# Patient Record
Sex: Male | Born: 1960 | Race: Black or African American | Hispanic: No | Marital: Single | Smoking: Never smoker
Health system: Southern US, Community
[De-identification: ages and names within clinical notes are randomized; demographics above are authoritative.]

## PROBLEM LIST (undated history)

## (undated) DIAGNOSIS — Z992 Dependence on renal dialysis: Secondary | ICD-10-CM

## (undated) DIAGNOSIS — I509 Heart failure, unspecified: Secondary | ICD-10-CM

## (undated) DIAGNOSIS — D649 Anemia, unspecified: Secondary | ICD-10-CM

## (undated) DIAGNOSIS — F411 Generalized anxiety disorder: Secondary | ICD-10-CM

## (undated) DIAGNOSIS — N186 End stage renal disease: Secondary | ICD-10-CM

## (undated) DIAGNOSIS — I10 Essential (primary) hypertension: Secondary | ICD-10-CM

## (undated) HISTORY — PX: CENTRAL VENOUS CATHETER INSERTION: SHX401

## (undated) HISTORY — DX: Generalized anxiety disorder: F41.1

## (undated) HISTORY — PX: APPENDECTOMY: SHX54

## (undated) HISTORY — PX: PERITONEAL CATHETER INSERTION: SHX2223

---

## 1998-03-13 ENCOUNTER — Emergency Department (HOSPITAL_COMMUNITY): Admission: EM | Admit: 1998-03-13 | Discharge: 1998-03-13 | Payer: Self-pay

## 2001-02-20 ENCOUNTER — Emergency Department (HOSPITAL_COMMUNITY): Admission: EM | Admit: 2001-02-20 | Discharge: 2001-02-20 | Payer: Self-pay | Admitting: Emergency Medicine

## 2004-03-08 ENCOUNTER — Emergency Department (HOSPITAL_COMMUNITY): Admission: EM | Admit: 2004-03-08 | Discharge: 2004-03-08 | Payer: Self-pay | Admitting: Emergency Medicine

## 2006-05-20 ENCOUNTER — Emergency Department (HOSPITAL_COMMUNITY): Admission: EM | Admit: 2006-05-20 | Discharge: 2006-05-20 | Payer: Self-pay | Admitting: Family Medicine

## 2006-06-29 ENCOUNTER — Emergency Department (HOSPITAL_COMMUNITY): Admission: EM | Admit: 2006-06-29 | Discharge: 2006-06-29 | Payer: Self-pay | Admitting: Emergency Medicine

## 2006-09-30 ENCOUNTER — Emergency Department (HOSPITAL_COMMUNITY): Admission: EM | Admit: 2006-09-30 | Discharge: 2006-09-30 | Payer: Self-pay | Admitting: Family Medicine

## 2007-02-19 ENCOUNTER — Emergency Department (HOSPITAL_COMMUNITY): Admission: EM | Admit: 2007-02-19 | Discharge: 2007-02-19 | Payer: Self-pay | Admitting: Family Medicine

## 2007-04-30 ENCOUNTER — Emergency Department (HOSPITAL_COMMUNITY): Admission: EM | Admit: 2007-04-30 | Discharge: 2007-04-30 | Payer: Self-pay | Admitting: Emergency Medicine

## 2007-11-05 ENCOUNTER — Emergency Department (HOSPITAL_COMMUNITY): Admission: EM | Admit: 2007-11-05 | Discharge: 2007-11-05 | Payer: Self-pay | Admitting: Emergency Medicine

## 2007-11-15 ENCOUNTER — Emergency Department (HOSPITAL_COMMUNITY): Admission: EM | Admit: 2007-11-15 | Discharge: 2007-11-15 | Payer: Self-pay | Admitting: Emergency Medicine

## 2009-12-03 ENCOUNTER — Inpatient Hospital Stay (HOSPITAL_COMMUNITY)
Admission: EM | Admit: 2009-12-03 | Discharge: 2009-12-11 | Payer: Self-pay | Source: Home / Self Care | Admitting: Emergency Medicine

## 2009-12-03 ENCOUNTER — Ambulatory Visit: Payer: Self-pay | Admitting: Internal Medicine

## 2009-12-04 ENCOUNTER — Encounter (INDEPENDENT_AMBULATORY_CARE_PROVIDER_SITE_OTHER): Payer: Self-pay | Admitting: Internal Medicine

## 2009-12-05 ENCOUNTER — Encounter (INDEPENDENT_AMBULATORY_CARE_PROVIDER_SITE_OTHER): Payer: Self-pay | Admitting: Internal Medicine

## 2009-12-05 ENCOUNTER — Ambulatory Visit: Payer: Self-pay | Admitting: Vascular Surgery

## 2010-06-03 LAB — CBC
HCT: 32.6 % — ABNORMAL LOW (ref 39.0–52.0)
HCT: 32.7 % — ABNORMAL LOW (ref 39.0–52.0)
HCT: 33.7 % — ABNORMAL LOW (ref 39.0–52.0)
HCT: 36.3 % — ABNORMAL LOW (ref 39.0–52.0)
HCT: 36.4 % — ABNORMAL LOW (ref 39.0–52.0)
Hemoglobin: 10.5 g/dL — ABNORMAL LOW (ref 13.0–17.0)
Hemoglobin: 10.6 g/dL — ABNORMAL LOW (ref 13.0–17.0)
Hemoglobin: 11.1 g/dL — ABNORMAL LOW (ref 13.0–17.0)
Hemoglobin: 11.7 g/dL — ABNORMAL LOW (ref 13.0–17.0)
MCH: 25.8 pg — ABNORMAL LOW (ref 26.0–34.0)
MCH: 25.9 pg — ABNORMAL LOW (ref 26.0–34.0)
MCH: 25.9 pg — ABNORMAL LOW (ref 26.0–34.0)
MCHC: 32.1 g/dL (ref 30.0–36.0)
MCHC: 32.1 g/dL (ref 30.0–36.0)
MCHC: 32.3 g/dL (ref 30.0–36.0)
MCHC: 32.3 g/dL (ref 30.0–36.0)
MCHC: 32.4 g/dL (ref 30.0–36.0)
MCHC: 32.5 g/dL (ref 30.0–36.0)
MCV: 79.7 fL (ref 78.0–100.0)
MCV: 80.4 fL (ref 78.0–100.0)
MCV: 81.1 fL (ref 78.0–100.0)
Platelets: 195 10*3/uL (ref 150–400)
Platelets: 197 10*3/uL (ref 150–400)
Platelets: 200 10*3/uL (ref 150–400)
Platelets: 215 10*3/uL (ref 150–400)
RBC: 4.06 MIL/uL — ABNORMAL LOW (ref 4.22–5.81)
RBC: 4.26 MIL/uL (ref 4.22–5.81)
RDW: 18.6 % — ABNORMAL HIGH (ref 11.5–15.5)
RDW: 18.7 % — ABNORMAL HIGH (ref 11.5–15.5)
RDW: 18.8 % — ABNORMAL HIGH (ref 11.5–15.5)
WBC: 5.3 10*3/uL (ref 4.0–10.5)
WBC: 5.7 10*3/uL (ref 4.0–10.5)
WBC: 7.2 10*3/uL (ref 4.0–10.5)
WBC: 7.2 10*3/uL (ref 4.0–10.5)

## 2010-06-03 LAB — RENAL FUNCTION PANEL
Albumin: 2.7 g/dL — ABNORMAL LOW (ref 3.5–5.2)
CO2: 33 mEq/L — ABNORMAL HIGH (ref 19–32)
Calcium: 7.9 mg/dL — ABNORMAL LOW (ref 8.4–10.5)
Chloride: 97 mEq/L (ref 96–112)
Creatinine, Ser: 5.06 mg/dL — ABNORMAL HIGH (ref 0.4–1.5)
GFR calc Af Amer: 15 mL/min — ABNORMAL LOW (ref >60)
GFR calc Af Amer: 15 mL/min — ABNORMAL LOW (ref >60)
GFR calc non Af Amer: 12 mL/min — ABNORMAL LOW (ref >60)
GFR calc non Af Amer: 12 mL/min — ABNORMAL LOW (ref >60)
Glucose, Bld: 115 mg/dL — ABNORMAL HIGH (ref 70–99)
Phosphorus: 4.5 mg/dL (ref 2.3–4.6)
Potassium: 3.8 mEq/L (ref 3.5–5.1)
Sodium: 140 mEq/L (ref 135–145)
Sodium: 141 mEq/L (ref 135–145)

## 2010-06-03 LAB — PROTIME-INR
INR: 1.14 (ref 0.00–1.49)
INR: 1.19 (ref 0.00–1.49)
INR: 1.21 (ref 0.00–1.49)
Prothrombin Time: 14.8 seconds (ref 11.6–15.2)
Prothrombin Time: 15.3 seconds — ABNORMAL HIGH (ref 11.6–15.2)
Prothrombin Time: 15.5 seconds — ABNORMAL HIGH (ref 11.6–15.2)
Prothrombin Time: 15.6 seconds — ABNORMAL HIGH (ref 11.6–15.2)
Prothrombin Time: 15.8 seconds — ABNORMAL HIGH (ref 11.6–15.2)

## 2010-06-03 LAB — COMPREHENSIVE METABOLIC PANEL
ALT: 32 U/L (ref 0–53)
ALT: 42 U/L (ref 0–53)
ALT: 54 U/L — ABNORMAL HIGH (ref 0–53)
AST: 36 U/L (ref 0–37)
AST: 37 U/L (ref 0–37)
AST: 47 U/L — ABNORMAL HIGH (ref 0–37)
AST: 62 U/L — ABNORMAL HIGH (ref 0–37)
Albumin: 2.5 g/dL — ABNORMAL LOW (ref 3.5–5.2)
Albumin: 2.5 g/dL — ABNORMAL LOW (ref 3.5–5.2)
Albumin: 2.6 g/dL — ABNORMAL LOW (ref 3.5–5.2)
Albumin: 3 g/dL — ABNORMAL LOW (ref 3.5–5.2)
Alkaline Phosphatase: 27 U/L — ABNORMAL LOW (ref 39–117)
Alkaline Phosphatase: 29 U/L — ABNORMAL LOW (ref 39–117)
Alkaline Phosphatase: 31 U/L — ABNORMAL LOW (ref 39–117)
Alkaline Phosphatase: 36 U/L — ABNORMAL LOW (ref 39–117)
Alkaline Phosphatase: 38 U/L — ABNORMAL LOW (ref 39–117)
Alkaline Phosphatase: 39 U/L (ref 39–117)
BUN: 46 mg/dL — ABNORMAL HIGH (ref 6–23)
BUN: 52 mg/dL — ABNORMAL HIGH (ref 6–23)
BUN: 54 mg/dL — ABNORMAL HIGH (ref 6–23)
CO2: 28 mEq/L (ref 19–32)
CO2: 30 mEq/L (ref 19–32)
CO2: 30 mEq/L (ref 19–32)
Calcium: 8 mg/dL — ABNORMAL LOW (ref 8.4–10.5)
Calcium: 8.1 mg/dL — ABNORMAL LOW (ref 8.4–10.5)
Calcium: 8.4 mg/dL (ref 8.4–10.5)
Calcium: 8.5 mg/dL (ref 8.4–10.5)
Chloride: 105 mEq/L (ref 96–112)
Chloride: 97 mEq/L (ref 96–112)
Creatinine, Ser: 4.96 mg/dL — ABNORMAL HIGH (ref 0.4–1.5)
Creatinine, Ser: 5.02 mg/dL — ABNORMAL HIGH (ref 0.4–1.5)
Creatinine, Ser: 5.21 mg/dL — ABNORMAL HIGH (ref 0.4–1.5)
GFR calc Af Amer: 15 mL/min — ABNORMAL LOW (ref >60)
GFR calc Af Amer: 16 mL/min — ABNORMAL LOW (ref >60)
GFR calc Af Amer: 16 mL/min — ABNORMAL LOW (ref >60)
GFR calc Af Amer: 16 mL/min — ABNORMAL LOW (ref >60)
GFR calc non Af Amer: 11 mL/min — ABNORMAL LOW (ref >60)
GFR calc non Af Amer: 13 mL/min — ABNORMAL LOW (ref >60)
GFR calc non Af Amer: 13 mL/min — ABNORMAL LOW (ref >60)
Glucose, Bld: 105 mg/dL — ABNORMAL HIGH (ref 70–99)
Glucose, Bld: 108 mg/dL — ABNORMAL HIGH (ref 70–99)
Glucose, Bld: 114 mg/dL — ABNORMAL HIGH (ref 70–99)
Glucose, Bld: 92 mg/dL (ref 70–99)
Glucose, Bld: 97 mg/dL (ref 70–99)
Potassium: 3.3 mEq/L — ABNORMAL LOW (ref 3.5–5.1)
Potassium: 3.4 mEq/L — ABNORMAL LOW (ref 3.5–5.1)
Potassium: 3.8 mEq/L (ref 3.5–5.1)
Potassium: 4 mEq/L (ref 3.5–5.1)
Potassium: 4.1 mEq/L (ref 3.5–5.1)
Sodium: 140 mEq/L (ref 135–145)
Sodium: 141 mEq/L (ref 135–145)
Sodium: 141 mEq/L (ref 135–145)
Total Bilirubin: 0.5 mg/dL (ref 0.3–1.2)
Total Bilirubin: 1.3 mg/dL — ABNORMAL HIGH (ref 0.3–1.2)
Total Protein: 6.1 g/dL (ref 6.0–8.3)
Total Protein: 6.1 g/dL (ref 6.0–8.3)
Total Protein: 6.1 g/dL (ref 6.0–8.3)
Total Protein: 6.2 g/dL (ref 6.0–8.3)
Total Protein: 6.3 g/dL (ref 6.0–8.3)
Total Protein: 6.4 g/dL (ref 6.0–8.3)

## 2010-06-03 LAB — DIFFERENTIAL
Basophils Relative: 1 % (ref 0–1)
Basophils Relative: 1 % (ref 0–1)
Eosinophils Absolute: 0.2 10*3/uL (ref 0.0–0.7)
Eosinophils Absolute: 0.2 10*3/uL (ref 0.0–0.7)
Monocytes Relative: 11 % (ref 3–12)
Monocytes Relative: 9 % (ref 3–12)
Neutro Abs: 4 10*3/uL (ref 1.7–7.7)
Neutrophils Relative %: 71 % (ref 43–77)
Neutrophils Relative %: 74 % (ref 43–77)

## 2010-06-03 LAB — HEPARIN ANTI-XA: Heparin LMW: 0.45 IU/mL

## 2010-06-03 LAB — HEPARIN LEVEL (UNFRACTIONATED)
Heparin Unfractionated: 0.34 IU/mL (ref 0.30–0.70)
Heparin Unfractionated: 0.47 IU/mL (ref 0.30–0.70)
Heparin Unfractionated: 0.57 IU/mL (ref 0.30–0.70)
Heparin Unfractionated: 0.58 IU/mL (ref 0.30–0.70)

## 2010-06-03 LAB — PROTEIN ELECTROPH W RFLX QUANT IMMUNOGLOBULINS
Albumin ELP: 52.1 % — ABNORMAL LOW (ref 55.8–66.1)
Alpha-1-Globulin: 8.1 % — ABNORMAL HIGH (ref 2.9–4.9)
Alpha-2-Globulin: 12.6 % — ABNORMAL HIGH (ref 7.1–11.8)
Beta 2: 3.4 % (ref 3.2–6.5)
Beta Globulin: 7.4 % — ABNORMAL HIGH (ref 4.7–7.2)
Total Protein ELP: 6.1 g/dL (ref 6.0–8.3)

## 2010-06-03 LAB — CULTURE, BLOOD (ROUTINE X 2): Culture: NO GROWTH

## 2010-06-03 LAB — IRON AND TIBC
Saturation Ratios: 6 % — ABNORMAL LOW (ref 20–55)
TIBC: 288 ug/dL (ref 215–435)

## 2010-06-03 LAB — POCT I-STAT, CHEM 8
Calcium, Ion: 1.04 mmol/L — ABNORMAL LOW (ref 1.12–1.32)
Creatinine, Ser: 5.2 mg/dL — ABNORMAL HIGH (ref 0.4–1.5)
Glucose, Bld: 98 mg/dL (ref 70–99)
Hemoglobin: 12.9 g/dL — ABNORMAL LOW (ref 13.0–17.0)
Sodium: 143 mEq/L (ref 135–145)
TCO2: 28 mmol/L (ref 0–100)

## 2010-06-03 LAB — URINALYSIS, MICROSCOPIC ONLY
Glucose, UA: NEGATIVE mg/dL
Ketones, ur: NEGATIVE mg/dL
Leukocytes, UA: NEGATIVE
Protein, ur: 100 mg/dL — AB
pH: 7 (ref 5.0–8.0)

## 2010-06-03 LAB — MAGNESIUM: Magnesium: 2.1 mg/dL (ref 1.5–2.5)

## 2010-06-03 LAB — TSH: TSH: 1.79 u[IU]/mL (ref 0.350–4.500)

## 2010-06-03 LAB — SODIUM, URINE, RANDOM: Sodium, Ur: 104 mEq/L

## 2010-06-03 LAB — URINALYSIS, ROUTINE W REFLEX MICROSCOPIC
Bilirubin Urine: NEGATIVE
Ketones, ur: NEGATIVE mg/dL
Protein, ur: >300 mg/dL — AB
Urobilinogen, UA: 0.2 mg/dL (ref 0.0–1.0)

## 2010-06-03 LAB — HIV ANTIBODY (ROUTINE TESTING W REFLEX): HIV: NONREACTIVE

## 2010-06-03 LAB — URINE CULTURE: Colony Count: 25000

## 2010-06-03 LAB — PHOSPHORUS
Phosphorus: 3.5 mg/dL (ref 2.3–4.6)
Phosphorus: 4.1 mg/dL (ref 2.3–4.6)

## 2010-06-03 LAB — URINE MICROSCOPIC-ADD ON

## 2010-06-21 ENCOUNTER — Encounter (HOSPITAL_COMMUNITY): Payer: Medicaid Other | Attending: Family Medicine

## 2010-06-21 DIAGNOSIS — D649 Anemia, unspecified: Secondary | ICD-10-CM | POA: Insufficient documentation

## 2010-07-05 ENCOUNTER — Encounter (HOSPITAL_COMMUNITY): Payer: Medicaid Other

## 2010-07-06 ENCOUNTER — Encounter (HOSPITAL_COMMUNITY): Payer: Medicaid Other | Attending: Internal Medicine

## 2010-07-06 DIAGNOSIS — N189 Chronic kidney disease, unspecified: Secondary | ICD-10-CM | POA: Insufficient documentation

## 2010-07-06 DIAGNOSIS — I129 Hypertensive chronic kidney disease with stage 1 through stage 4 chronic kidney disease, or unspecified chronic kidney disease: Secondary | ICD-10-CM | POA: Insufficient documentation

## 2010-07-30 ENCOUNTER — Encounter (HOSPITAL_COMMUNITY): Payer: Medicaid Other

## 2011-01-28 ENCOUNTER — Encounter: Payer: Self-pay | Admitting: Gastroenterology

## 2011-01-31 ENCOUNTER — Encounter: Payer: Self-pay | Admitting: *Deleted

## 2011-01-31 ENCOUNTER — Emergency Department (HOSPITAL_COMMUNITY)
Admission: EM | Admit: 2011-01-31 | Discharge: 2011-01-31 | Discharge status: Against Medical Advice | Payer: Medicaid Other | Attending: Emergency Medicine | Admitting: Emergency Medicine

## 2011-01-31 DIAGNOSIS — K055 Other periodontal diseases: Secondary | ICD-10-CM | POA: Insufficient documentation

## 2011-01-31 DIAGNOSIS — I10 Essential (primary) hypertension: Secondary | ICD-10-CM | POA: Insufficient documentation

## 2011-01-31 DIAGNOSIS — K029 Dental caries, unspecified: Secondary | ICD-10-CM | POA: Insufficient documentation

## 2011-01-31 DIAGNOSIS — K068 Other specified disorders of gingiva and edentulous alveolar ridge: Secondary | ICD-10-CM

## 2011-01-31 HISTORY — DX: Heart failure, unspecified: I50.9

## 2011-01-31 HISTORY — DX: Essential (primary) hypertension: I10

## 2011-01-31 NOTE — ED Notes (Signed)
Pt left prior to receiving instructions, pt did not sign AMA form

## 2011-01-31 NOTE — ED Notes (Signed)
PA at bedside.

## 2011-01-31 NOTE — ED Notes (Signed)
Attempted to draw blood on pt. Pt states "I've been here forever, I think Im just going to leave and follow up with my dentist". Reassurance offered, encouraged pt to stay, apologies made for wait time in waiting room. Notified PA

## 2011-01-31 NOTE — ED Provider Notes (Signed)
History     CSN: [id number] Arrival date & time: 01/31/2011  3:39 AM   First MD Initiated Contact with Patient 01/31/11 [phone]      Chief Complaint  Patient presents with  . Dental Problem    (Consider location/radiation/quality/duration/timing/severity/associated sxs/prior treatment) HPI Comments: Patient reports that he has had persistent gingival bleeding since yesterday morning.  No trauma to the area.  He reports a history of gingivitis, but has never had bleeding that persisted for this long.  No pain associated.  No history of liver disease.  He reports occasional aspirin use, but has not used aspirin in 2 days.  No use of other blood thinners.  Denies any alcohol use.  Denies any bleeding from any other area. The history is provided by the patient.    Past Medical History  Diagnosis Date  . Hypertension   . Renal disorder   . CHF (congestive heart failure)     Past Surgical History  Procedure Date  . Appendectomy     History reviewed. No pertinent family history.  History  Substance Use Topics  . Smoking status: Never Smoker   . Smokeless tobacco: Not on file  . Alcohol Use: No      Review of Systems  Constitutional: Negative for fever, chills, diaphoresis and unexpected weight change.  HENT: Positive for dental problem. Negative for ear pain, nosebleeds, congestion, sore throat, facial swelling, drooling, neck pain and neck stiffness.   Respiratory: Negative for chest tightness and shortness of breath.   Cardiovascular: Negative for chest pain.  Gastrointestinal: Negative for nausea, vomiting, abdominal pain and blood in stool.  Genitourinary: Negative for hematuria.  Skin: Negative for color change, pallor and rash.  Neurological: Negative for syncope, light-headedness and headaches.  Hematological: Negative for adenopathy.  Psychiatric/Behavioral: Negative for behavioral problems.    Allergies  Prednisone  Home Medications   Current Outpatient Rx    Name Route Sig Dispense Refill  . ASPIRIN 325 MG PO TABS Oral Take 325 mg by mouth daily.      Marland Kitchen CLONAZEPAM 0.5 MG PO TABS Oral Take 0.5 mg by mouth 2 (two) times daily as needed.      Marland Kitchen CLONIDINE HCL 0.2 MG PO TABS Oral Take 0.2 mg by mouth 2 (two) times daily.      Marland Kitchen FERROUS FUMARATE 325 (106 FE) MG PO TABS Oral Take 1 tablet by mouth 3 (three) times daily.      . FUROSEMIDE 80 MG PO TABS Oral Take 120 mg by mouth 2 (two) times daily.      Marland Kitchen HYDROCODONE-ACETAMINOPHEN 5-500 MG PO TABS Oral Take 1 tablet by mouth every 6 (six) hours as needed.      Marland Kitchen LABETALOL HCL 300 MG PO TABS Oral Take 300 mg by mouth 2 (two) times daily.      Marland Kitchen LOSARTAN POTASSIUM 25 MG PO TABS Oral Take 12.5 mg by mouth. Every other day     . NIFEDIPINE ER 60 MG PO TB24 Oral Take 60 mg by mouth daily.      Marland Kitchen SEVELAMER CARBONATE 800 MG PO TABS Oral Take 800 mg by mouth 3 (three) times daily with meals.        BP 153/95  Pulse 70  Temp(Src) 98.1 F (36.7 C) (Oral)  Resp 20  SpO2 98%  Physical Exam  Constitutional: He is oriented to person, place, and time. He appears well-developed and well-nourished. No distress.  HENT:  Head: Normocephalic and atraumatic. No trismus in  the jaw.  Right Ear: External ear normal.  Left Ear: External ear normal.  Nose: Nose normal.  Mouth/Throat: Oropharynx is clear and moist and mucous membranes are normal. No oral lesions. Dental caries present. No dental abscesses or lacerations.    Eyes: EOM are normal. Pupils are equal, round, and reactive to light.  Neck: Normal range of motion. Neck supple.  Cardiovascular: Normal rate and regular rhythm.   Pulmonary/Chest: Effort normal and breath sounds normal.  Abdominal: Soft. Bowel sounds are normal. He exhibits no distension. There is no tenderness.  Neurological: He is alert and oriented to person, place, and time.  Skin: Skin is warm and dry. No petechiae and no purpura noted. He is not diaphoretic.  Psychiatric: He has a normal  mood and affect. His behavior is normal.    ED Course  Procedures (including critical care time)  Labs Reviewed - No data to display No results found.   No diagnosis found.  7:45 AM Patient requested to leave the Emergency Department and follow up with dentist when the nurse entered the room to get blood work.  I went in and discussed with patient the importance of getting blood work.  He reports that the bleeding has decreased and that he will have his PCP get the blood work.  Patient reports that he does not want to wait in the Emergency Department any longer.   Patient educated on the risks of leaving the ED prior to getting his work up.   MDM          Pascal Lux Wingen 02/01/11 2201

## 2011-01-31 NOTE — ED Notes (Signed)
Pt c/o right top gum/tooth bleeding; no known injury; no previous history; woke up this morning with it bleeding and has bled on and off throughout the day

## 2011-02-03 NOTE — ED Provider Notes (Signed)
Medical screening examination/treatment/procedure(s) were performed by non-physician practitioner and as supervising physician I was immediately available for consultation/collaboration.  Shamieka Gullo K Sorcha Rotunno-Rasch, MD 02/03/11 2316 

## 2011-02-21 ENCOUNTER — Ambulatory Visit: Payer: Medicaid Other | Admitting: Gastroenterology

## 2011-03-07 ENCOUNTER — Ambulatory Visit (INDEPENDENT_AMBULATORY_CARE_PROVIDER_SITE_OTHER): Payer: Self-pay | Admitting: Surgery

## 2011-03-09 ENCOUNTER — Telehealth (INDEPENDENT_AMBULATORY_CARE_PROVIDER_SITE_OTHER): Payer: Self-pay | Admitting: Surgery

## 2011-03-09 NOTE — Telephone Encounter (Signed)
Reason for call: Patient wishing peritoneal dialysis catheter.  I had this patient no-show earlier this week. He called to insist on being seen right away. Eventually, I found his current nephrologist. This is a patient who has had worsening kidney disease. Has been managed in [place], now apparently at [facility]. The nephrologist there have been trying to get him to consider hemodialysis. The patient has adamantly refused up to now. The patient refused a peritoneal dialysis catheter last year but has changed his mind & self-referred to see [facility] to get one placed. He apparently has a renal mass that needs further evaluation. He was hesitant first and now is related to a followup bowel. It seemed consistent with a cyst.  I called the nephrology department. I discussed his case with Dr. Hyacinth Meeker. I think she is a nephrology fellow that has been helping take care of him. She is due to see him later this week. She feels the patient needs to get established with hemodialysis to stabilize his kidney disease. Once that is done, then it may be reasonable to consider placement of peritoneal dialysis catheter to transition from hemodialysis to CAPD.  I did not get the sense that nephrology had sent the patient to [place]. I got the sense that he was doing this all himself to see [place].  I think it is reasonable to wait until nephrology feels he is safe and is a decent candidate for peritoneal dialysis. I see no reason to see him before then. I agree that it is unrealistic to expect only CAPDialysis will be sufficient for him at all times. I think given the fact he needs acute stabilization, he needs hemodialysis set up first. An AV fistula would be a good idea as well per [facility]. Hopefully he will come to terms with that.  If he is against these recommendations, refuses hemodialysis, will not get training and does not have adequate support; then, I decline to see him until those goals are met. It would  not be a good idea to put a catheter in him unless he is ready and able to deal with it.  I explained that to the nephrologist at [facility]. We will wait to hear from them when they feel he is a reasonable candidate for peritoneal dialysis.

## 2011-03-31 ENCOUNTER — Ambulatory Visit (INDEPENDENT_AMBULATORY_CARE_PROVIDER_SITE_OTHER): Payer: Self-pay | Admitting: Surgery

## 2011-09-07 ENCOUNTER — Emergency Department (HOSPITAL_COMMUNITY): Payer: Medicare Other

## 2011-09-07 ENCOUNTER — Inpatient Hospital Stay (HOSPITAL_COMMUNITY)
Admission: EM | Admit: 2011-09-07 | Discharge: 2011-09-25 | DRG: 207 | Disposition: A | Discharge status: Home Health | Payer: Medicare Other | Attending: Internal Medicine | Admitting: Internal Medicine

## 2011-09-07 ENCOUNTER — Encounter (HOSPITAL_COMMUNITY): Payer: Self-pay

## 2011-09-07 DIAGNOSIS — E875 Hyperkalemia: Secondary | ICD-10-CM | POA: Diagnosis present

## 2011-09-07 DIAGNOSIS — R061 Stridor: Secondary | ICD-10-CM

## 2011-09-07 DIAGNOSIS — R404 Transient alteration of awareness: Secondary | ICD-10-CM | POA: Diagnosis present

## 2011-09-07 DIAGNOSIS — R0602 Shortness of breath: Secondary | ICD-10-CM

## 2011-09-07 DIAGNOSIS — I5022 Chronic systolic (congestive) heart failure: Secondary | ICD-10-CM | POA: Diagnosis present

## 2011-09-07 DIAGNOSIS — R748 Abnormal levels of other serum enzymes: Secondary | ICD-10-CM

## 2011-09-07 DIAGNOSIS — Z992 Dependence on renal dialysis: Secondary | ICD-10-CM

## 2011-09-07 DIAGNOSIS — Z91199 Patient's noncompliance with other medical treatment and regimen due to unspecified reason: Secondary | ICD-10-CM

## 2011-09-07 DIAGNOSIS — N186 End stage renal disease: Secondary | ICD-10-CM

## 2011-09-07 DIAGNOSIS — Z6841 Body Mass Index (BMI) 40.0 and over, adult: Secondary | ICD-10-CM

## 2011-09-07 DIAGNOSIS — Z9119 Patient's noncompliance with other medical treatment and regimen: Secondary | ICD-10-CM

## 2011-09-07 DIAGNOSIS — J383 Other diseases of vocal cords: Secondary | ICD-10-CM

## 2011-09-07 DIAGNOSIS — D649 Anemia, unspecified: Secondary | ICD-10-CM

## 2011-09-07 DIAGNOSIS — J9602 Acute respiratory failure with hypercapnia: Secondary | ICD-10-CM | POA: Diagnosis present

## 2011-09-07 DIAGNOSIS — D696 Thrombocytopenia, unspecified: Secondary | ICD-10-CM

## 2011-09-07 DIAGNOSIS — I10 Essential (primary) hypertension: Secondary | ICD-10-CM

## 2011-09-07 DIAGNOSIS — I5020 Unspecified systolic (congestive) heart failure: Secondary | ICD-10-CM

## 2011-09-07 DIAGNOSIS — I5021 Acute systolic (congestive) heart failure: Secondary | ICD-10-CM

## 2011-09-07 DIAGNOSIS — I120 Hypertensive chronic kidney disease with stage 5 chronic kidney disease or end stage renal disease: Secondary | ICD-10-CM | POA: Diagnosis present

## 2011-09-07 DIAGNOSIS — I279 Pulmonary heart disease, unspecified: Secondary | ICD-10-CM | POA: Diagnosis present

## 2011-09-07 DIAGNOSIS — E874 Mixed disorder of acid-base balance: Secondary | ICD-10-CM | POA: Diagnosis not present

## 2011-09-07 DIAGNOSIS — I5023 Acute on chronic systolic (congestive) heart failure: Secondary | ICD-10-CM | POA: Diagnosis present

## 2011-09-07 DIAGNOSIS — I509 Heart failure, unspecified: Secondary | ICD-10-CM

## 2011-09-07 DIAGNOSIS — J9601 Acute respiratory failure with hypoxia: Secondary | ICD-10-CM

## 2011-09-07 DIAGNOSIS — E669 Obesity, unspecified: Secondary | ICD-10-CM

## 2011-09-07 DIAGNOSIS — R0902 Hypoxemia: Secondary | ICD-10-CM

## 2011-09-07 DIAGNOSIS — I340 Nonrheumatic mitral (valve) insufficiency: Secondary | ICD-10-CM

## 2011-09-07 DIAGNOSIS — I953 Hypotension of hemodialysis: Secondary | ICD-10-CM

## 2011-09-07 DIAGNOSIS — N189 Chronic kidney disease, unspecified: Secondary | ICD-10-CM

## 2011-09-07 DIAGNOSIS — E662 Morbid (severe) obesity with alveolar hypoventilation: Secondary | ICD-10-CM

## 2011-09-07 DIAGNOSIS — I059 Rheumatic mitral valve disease, unspecified: Secondary | ICD-10-CM | POA: Diagnosis present

## 2011-09-07 DIAGNOSIS — R0689 Other abnormalities of breathing: Secondary | ICD-10-CM

## 2011-09-07 DIAGNOSIS — I517 Cardiomegaly: Secondary | ICD-10-CM

## 2011-09-07 DIAGNOSIS — I272 Pulmonary hypertension, unspecified: Secondary | ICD-10-CM | POA: Diagnosis present

## 2011-09-07 DIAGNOSIS — J811 Chronic pulmonary edema: Secondary | ICD-10-CM

## 2011-09-07 DIAGNOSIS — J9620 Acute and chronic respiratory failure, unspecified whether with hypoxia or hypercapnia: Principal | ICD-10-CM | POA: Diagnosis not present

## 2011-09-07 DIAGNOSIS — N2581 Secondary hyperparathyroidism of renal origin: Secondary | ICD-10-CM | POA: Diagnosis present

## 2011-09-07 DIAGNOSIS — R410 Disorientation, unspecified: Secondary | ICD-10-CM | POA: Diagnosis not present

## 2011-09-07 DIAGNOSIS — I959 Hypotension, unspecified: Secondary | ICD-10-CM | POA: Diagnosis present

## 2011-09-07 HISTORY — DX: Anemia, unspecified: D64.9

## 2011-09-07 HISTORY — DX: Dependence on renal dialysis: Z99.2

## 2011-09-07 LAB — CBC
HCT: 29.2 % — ABNORMAL LOW (ref 39.0–52.0)
MCHC: 30.5 g/dL (ref 30.0–36.0)
Platelets: 123 10*3/uL — ABNORMAL LOW (ref 150–400)
RDW: 17.4 % — ABNORMAL HIGH (ref 11.5–15.5)
WBC: 4 10*3/uL (ref 4.0–10.5)

## 2011-09-07 LAB — POCT I-STAT TROPONIN I: Troponin i, poc: 0.1 ng/mL (ref 0.00–0.08)

## 2011-09-07 LAB — BASIC METABOLIC PANEL
Chloride: 99 mEq/L (ref 96–112)
GFR calc Af Amer: 3 mL/min — ABNORMAL LOW (ref >90)
GFR calc non Af Amer: 3 mL/min — ABNORMAL LOW (ref >90)
Potassium: 4.3 mEq/L (ref 3.5–5.1)
Sodium: 140 mEq/L (ref 135–145)

## 2011-09-07 LAB — PRO B NATRIURETIC PEPTIDE: Pro B Natriuretic peptide (BNP): 24858 pg/mL — ABNORMAL HIGH (ref 0–125)

## 2011-09-07 MED ORDER — DARBEPOETIN ALFA-POLYSORBATE 150 MCG/0.3ML IJ SOLN
150.0000 ug | INTRAMUSCULAR | Status: DC
  Administered 2011-09-08 – 2011-09-22 (×3): 150 ug via SUBCUTANEOUS
  Filled 2011-09-07 (×4): qty 0.3

## 2011-09-07 MED ORDER — DELFLEX-LC/2.5% DEXTROSE 394 MOSM/L IP SOLN
Freq: Once | INTRAPERITONEAL | Status: AC
  Administered 2011-09-08: 5000 mL via INTRAPERITONEAL

## 2011-09-07 MED ORDER — DELFLEX-LC/4.25% DEXTROSE 483 MOSM/L IP SOLN
Freq: Once | INTRAPERITONEAL | Status: AC
  Administered 2011-09-08: 5000 mL via INTRAPERITONEAL

## 2011-09-07 MED ORDER — FUROSEMIDE 10 MG/ML IJ SOLN
120.0000 mg | Freq: Once | INTRAVENOUS | Status: DC
  Filled 2011-09-07: qty 12

## 2011-09-07 MED ORDER — FUROSEMIDE 10 MG/ML IJ SOLN
INTRAMUSCULAR | Status: AC
  Administered 2011-09-07: 120 mg
  Filled 2011-09-07: qty 12

## 2011-09-07 MED ORDER — FUROSEMIDE 10 MG/ML IJ SOLN
40.0000 mg | Freq: Once | INTRAMUSCULAR | Status: AC
  Administered 2011-09-07: 40 mg via INTRAVENOUS
  Filled 2011-09-07: qty 4

## 2011-09-07 MED ORDER — DELFLEX-LM/4.25% DEXTROSE 485 MOSM/L IP SOLN: INTRAPERITONEAL | Status: DC

## 2011-09-07 NOTE — ED Provider Notes (Signed)
Medical screening examination/treatment/procedure(s) were conducted as a shared visit with non-physician practitioner(s) and myself.  I personally evaluated the patient during the encounter Patient on peritoneal dialysis his had worsening shortness of breath and appears to be fluid overloaded with CHF. Patient was given IV Lasix and spoke with renal for possible hemodialysis for fluid removal. Patient denies any chest pain but is having a small troponin leak.  Gwyneth Sprout, MD 09/07/11 2316

## 2011-09-07 NOTE — ED Notes (Signed)
Patient reports that he saw Dr. Judith Blonder 1.5 weeks ago and was diagnosed with bronchitis. Patient states that he finished the antibiotics prescribed.  Patient reports that he had increased SOB today. Patient has a productive cough with clear/light yellow sputum. Patient denies fever, vomiting , or diarrhea.

## 2011-09-07 NOTE — H&P (Addendum)
PCP:   Geraldo Pitter, MD  [facility] Renal  Chief Complaint:   Shortness of breath.  HPI: Gary Rivera is a 51 y.o. male   has a past medical history of Hypertension; Renal disorder; CHF (congestive heart failure); Peritoneal dialysis catheter in place; and Anemia.   Presented with  Patient has history of HTN and ESRD on PD. In the past patient had received his care at [facility] and has been hospitalized there states  about a year ago with fluid overload and possible CHF. Recently he been going to renal doctor at [facility]. He gets peritoneal dialysis 4 times a day. He have noticed a gain of about 20 pounds recently and started to have worsening shortness of breath for past 24 hours or so if not more. He presented to [facility] department and was found to be fluid overloaded. Hospitalist was called on admission have spoke to Dr. Allena Katz with renal who recommended transferred to [facility] cone date we'll arrange for peritoneal dialysis data for now will give high-dose Lasix S. patient continues to produce urine. He denies any abdominal pain nausea vomiting and chest pain. Patient has no HD dialyses access at this point.  Revie no HD dialysis access at this pointw of Systems:    Pertinent positives include:shortness of breath at rest. Left arm cramps  Constitutional:  No weight loss, night sweats, Fevers, chills, fatigue, weight loss  HEENT:  No headaches, Difficulty swallowing,Tooth/dental problems,Sore throat,  No sneezing, itching, ear ache, nasal congestion, post nasal drip,  Cardio-vascular:  No chest pain, Orthopnea, PND, anasarca, dizziness, palpitations.no Bilateral lower extremity swelling  GI:  No heartburn, indigestion, abdominal pain, nausea, vomiting, diarrhea, change in bowel habits, loss of appetite, melena, blood in stool, hematemesis Resp:  no  No dyspnea on exertion, No excess mucus, no productive cough, No non-productive cough, No coughing up of blood.No change in color  of mucus.No wheezing. Skin:  no rash or lesions. No jaundice GU:  no dysuria, change in color of urine, no urgency or frequency. No straining to urinate.  No flank pain.  Musculoskeletal:  No joint pain or no joint swelling. No decreased range of motion. No back pain.  Psych:  No change in mood or affect. No depression or anxiety. No memory loss.  Neuro: no localizing neurological complaints, no tingling, no weakness, no double vision, no gait abnormality, no slurred speech, no confusion  Otherwise ROS are negative except for above, 10 systems were reviewed  Past Medical History: Past Medical History  Diagnosis Date  . Hypertension   . Renal disorder   . CHF (congestive heart failure)   . Peritoneal dialysis catheter in place   . Anemia     patient reporats that last hgb 7.9 a week ago   Past Surgical History  Procedure Date  . Appendectomy      Medications: Prior to Admission medications   Medication Sig Start Date End Date Taking? Authorizing Provider  aspirin 325 MG tablet Take 325 mg by mouth daily.     Yes Historical Provider, MD  b complex-vitamin c-folic acid (NEPHRO-VITE) 0.8 MG TABS Take 0.8 mg by mouth daily.   Yes Historical Provider, MD  calcitRIOL (ROCALTROL) 0.25 MCG capsule Take 0.25 mcg by mouth See admin instructions. Takes 1 capsule daily Monday through friday   Yes Historical Provider, MD  calcium carbonate (TUMS - DOSED IN MG ELEMENTAL CALCIUM) 500 MG chewable tablet Chew 1 tablet by mouth 3 (three) times daily with meals.  Yes Historical Provider, MD  clonazePAM (KLONOPIN) 0.5 MG tablet Take 0.5 mg by mouth 2 (two) times daily as needed. anxiety   Yes Historical Provider, MD  cloNIDine (CATAPRES) 0.2 MG tablet Take 0.2 mg by mouth 2 (two) times daily as needed. Only if blood pressure is elevated   Yes Historical Provider, MD  HYDROcodone-acetaminophen (VICODIN) 5-500 MG per tablet Take 1 tablet by mouth every 6 (six) hours as needed. pain   Yes Historical  Provider, MD  labetalol (NORMODYNE) 300 MG tablet Take 300 mg by mouth 2 (two) times daily.     Yes Historical Provider, MD  NIFEdipine (PROCARDIA-XL/ADALAT CC) 60 MG 24 hr tablet Take 60 mg by mouth daily.     Yes Historical Provider, MD  sevelamer (RENVELA) 800 MG tablet Take 800 mg by mouth See admin instructions. Takes 1 tablet tid with meals and snacks   Yes Historical Provider, MD    Allergies:   Allergies  Allergen Reactions  . Prednisone Nausea Only    Social History:  Ambulatory independently  Lives at home with family    reports that he has never smoked. He does not have any smokeless tobacco history on file. He reports that he does not drink alcohol or use illicit drugs.   Family History: family history includes Diabetes in his mother and Hypertension in his mother and sister.    Physical Exam: Patient Vitals for the past 24 hrs:  BP Temp Temp src Pulse Resp SpO2  09/07/11 2149 - - - - - 100 %  09/07/11 2108 - - - 82  30  93 %  09/07/11 2045 - - - 82  29  96 %  09/07/11 2030 146/85 mmHg - - 78  26  91 %  09/07/11 1930 145/78 mmHg - - 79  26  93 %  09/07/11 1839 - - - - - 90 %  09/07/11 1823 158/84 mmHg 97.9 F (36.6 C) Oral 76  18  96 %    1. General:  in No Acute distress 2. Psychological: Alert and  Oriented 3. Head/ENT:   Moist Mucous Membranes                          Head Non traumatic, neck supple                          Normal  Dentition 4. SKIN: normal  Skin turgor,  Skin clean Dry and intact no rash 5. Heart: Regular rate and rhythm no Murmur, Rub or gallop 6. Lungs:  no wheezes  occasional  crackles   at the bases  7. Abdomen: Soft, non-tender,  slightly  distended 8. Lower extremities: no clubbing, cyanosis,  1+  edema 9. Neurologically Grossly intact, moving all 4 extremities equally 10. MSK: Normal range of motion  body mass index is unknown because there is no height or weight on file.   Labs on Admission:   [facility] 09/07/11 2000  NA  140  K 4.3  CL 99  CO2 25  GLUCOSE 113*  BUN 116*  CREATININE 18.04*  CALCIUM 7.2*  MG --  PHOS --   No results found for this basename: AST:2,ALT:2,ALKPHOS:2,BILITOT:2,PROT:2,ALBUMIN:2 in the last 72 hours No results found for this basename: LIPASE:2,AMYLASE:2 in the last 72 hours  Basename 09/07/11 2000  WBC 4.0  NEUTROABS --  HGB 8.9*  HCT 29.2*  MCV 95.7  PLT 123*  No results found for this basename: CKTOTAL:3,CKMB:3,CKMBINDEX:3,TROPONINI:3 in the last 72 hours No results found for this basename: TSH,T4TOTAL,FREET3,T3FREE,THYROIDAB in the last 72 hours No results found for this basename: VITAMINB12:2,FOLATE:2,FERRITIN:2,TIBC:2,IRON:2,RETICCTPCT:2 in the last 72 hours No results found for this basename: HGBA1C    CrCl is unknown because there is no height on file for the current visit. ABG    Component Value Date/Time   TCO2 28 12/03/2009 0938     No results found for this basename: DDIMER     Other results:  I have pearsonaly reviewed this: ECG REPORT  Rate:80   Rhythm:  sinus rhythm ST&T Change: no ischemic change BNP 24858.0   Radiological Exams on Admission: Dg Chest 2 View  09/07/2011  *RADIOLOGY REPORT*  Clinical Data: Shortness of breath.  Anxiety.  Congestive heart failure.  Hypertension.  CHEST - 2 VIEW  Comparison: 12/07/2009  Findings: Moderate cardiomegaly stable.  New diffuse mixed interstitial and airspace disease is seen bilaterally, consistent with diffuse pulmonary edema.  No evidence of pleural effusion.  IMPRESSION:  Acute pulmonary edema and stable cardiomegaly, consistent with congestive heart failure.  Original Report Authenticated By: Danae Orleans, M.D.    Assessment/Plan 51 year old gentleman with history of CHF and end-stage renal disease on peritoneal dialysis comes in with fluid overload   Present on Admission:  .CHF (congestive heart failure) -pulmonary edema likely multifactorial renal versus cardiac in etiology. Admit per CHF  protocol, will cycle cardiac markers. Lasix as per renal. For now have given her 120 mg IV awaiting on results. Check echo gram would need obtain records from [facility] for comparison  .Hypoxemia - oxygen follow closely and step down   .ESRD (end stage renal disease) - spoke to Dr. Allena Katz who will see patient once he is transferred to [facility] cone likely initiate peritoneal dialysis tonight. Otherwise fluid management as per renal  .HTN (hypertension) - continue home medications  .Anemia - obtain anemia panel and follow CBC  Elevated troponin - no chest pain, will continue to cycle and obtain full panel, likely he is not clearing due to ESRD. If troponin continues to go up would recommend cardiac consult.   Prophylaxis: SCD  Protonix  CODE STATUS: Full code   Other plan as per orders.  I have spent a total of  65 min on this admission time taking to speak to Dr. Allena Katz, Dr. Mikeal Hawthorne is aware of transfer   [facility] 09/07/2011, 11:49 PM

## 2011-09-07 NOTE — ED Notes (Signed)
Patient is lethargic. Patient reports that he has only had peritoneal dialysis once today and normally has it four times daily.

## 2011-09-07 NOTE — ED Notes (Signed)
RN - Azzie Roup and PA - [facility] notified of elevated troponin level.

## 2011-09-07 NOTE — ED Notes (Signed)
Pt between 88-93 on 3L of oxygen after being walked to the room. Triage RN reports pt is stable in oxygen when awake, but unable to maintain stable oxygen saturation when sleeping.

## 2011-09-07 NOTE — ED Notes (Signed)
Pt also reports he is a peritoneal dialysis pt that gets treatment 4x a day and only got it once today. Site is clean, dry, and intact. Site is in left upper abdomen. Pt reports unable to get peritoneal dialysis because he was sick with an anxiety attack, heavy sleeping, and cramping. Denies vomiting and diarrhea.

## 2011-09-07 NOTE — ED Notes (Addendum)
Lab called about BNP add-on. Lab confirmed they will add the BNP to the test. Lab sticker sent to stat lab.

## 2011-09-07 NOTE — Consult Note (Signed)
Reason for Consult: Management of volume overload in a patient with ESRD on peritoneal dialysis Referring Physician: [facility]-Dr. Lana Fish  Gary Rivera is an 50 y.o. male.  HPI: 51 year old [place] American man with a history of end-stage renal disease who has been on peritoneal dialysis for the past 4-5 months. From history, he appears to be still adjusting to peritoneal dialysis treatments and requiring close monitoring/supervision. He reports that he suffered a upper respiratory tract infection about 3 weeks ago and has "not really recovered from that" and developed associated 20 pound weight gain now with progressive worsening shortness of breath. He states that he attempted to inform his home therapies nurses but did not get any successful responses. Unclear as to whether he informed his nephrologist-Dr. Moishe Spice in [facility].  He states that he presented today to the emergency room for further management of his shortness of breath. At some point, he reports that he was asked to discontinue his furosemide over the past few weeks. Reports some cough but denies any hemoptysis, fevers, sputum or other constitutional symptoms suggestive of infection. With regards to his peritoneal dialysis-he does manual exchanges (CAPD) with 2 L of Icodextrin for 12 hours overnight, 2 L of 3 exchanges (alternating 1.5% and 2.5% solution) over about 8 hours. 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  Past Medical History  Diagnosis Date  . Hypertension   . Renal disorder   . CHF (congestive heart failure)   . Peritoneal dialysis catheter in place   . Anemia     patient reporats that last hgb 7.9 a week ago    Past Surgical History  Procedure Date  . Appendectomy     Family History  Problem Relation Age of Onset  . Diabetes Mother   . Hypertension Mother   . Hypertension Sister     Social History:  reports that he has never smoked. He does not have any smokeless tobacco  history on file. He reports that he does not drink alcohol or use illicit drugs.  Allergies:  Allergies  Allergen Reactions  . Prednisone Nausea Only    Medications: Prior to Admission:  (Not in a hospital admission)  Results for orders placed during the hospital encounter of 09/07/11 (from the past 48 hour(s))  CBC     Status: Abnormal   Collection Time   09/07/11  8:00 PM      Component Value Range Comment   WBC 4.0  4.0 - 10.5 K/uL    RBC 3.05 (*) 4.22 - 5.81 MIL/uL    Hemoglobin 8.9 (*) 13.0 - 17.0 g/dL    HCT 40.9 (*) 81.1 - 52.0 %    MCV 95.7  78.0 - 100.0 fL    MCH 29.2  26.0 - 34.0 pg    MCHC 30.5  30.0 - 36.0 g/dL    RDW 91.4 (*) 78.2 - 15.5 %    Platelets 123 (*) 150 - 400 K/uL   BASIC METABOLIC PANEL     Status: Abnormal   Collection Time   09/07/11  8:00 PM      Component Value Range Comment   Sodium 140  135 - 145 mEq/L    Potassium 4.3  3.5 - 5.1 mEq/L    Chloride 99  96 - 112 mEq/L    CO2 25  19 - 32 mEq/L    Glucose, Bld 113 (*) 70 - 99 mg/dL    BUN 956 (*) 6 - 23 mg/dL  Creatinine, Ser 18.04 (*) 0.50 - 1.35 mg/dL    Calcium 7.2 (*) 8.4 - 10.5 mg/dL    GFR calc non Af Amer 3 (*) >90 mL/min    GFR calc Af Amer 3 (*) >90 mL/min   PRO B NATRIURETIC PEPTIDE     Status: Abnormal   Collection Time   09/07/11  8:00 PM      Component Value Range Comment   Pro B Natriuretic peptide (BNP) 24858.0 (*) 0 - 125 pg/mL   POCT I-STAT TROPONIN I     Status: Abnormal   Collection Time   09/07/11  8:10 PM      Component Value Range Comment   Troponin i, poc 0.10 (*) 0.00 - 0.08 ng/mL    Comment NOTIFIED PHYSICIAN      Comment 3              Dg Chest 2 View  09/07/2011  *RADIOLOGY REPORT*  Clinical Data: Shortness of breath.  Anxiety.  Congestive heart failure.  Hypertension.  CHEST - 2 VIEW  Comparison: 12/07/2009  Findings: Moderate cardiomegaly stable.  New diffuse mixed interstitial and airspace disease is seen bilaterally, consistent with diffuse pulmonary edema.   No evidence of pleural effusion.  IMPRESSION:  Acute pulmonary edema and stable cardiomegaly, consistent with congestive heart failure.  Original Report Authenticated By: Danae Orleans, M.D.    Review of Systems  Constitutional: Positive for chills and malaise/fatigue. Negative for fever, weight loss and diaphoresis.  HENT: Negative.  Negative for neck pain.   Eyes: Negative.   Respiratory: Positive for cough, shortness of breath and wheezing. Negative for hemoptysis and sputum production.   Cardiovascular: Positive for chest pain, orthopnea and leg swelling. Negative for palpitations, claudication and PND.  Gastrointestinal: Positive for nausea. Negative for heartburn, vomiting, abdominal pain, diarrhea, constipation, blood in stool and melena.  Genitourinary: Negative.   Musculoskeletal: Positive for back pain. Negative for myalgias, joint pain and falls.  Skin: Negative.   Neurological: Positive for weakness.  Endo/Heme/Allergies: Negative.   Psychiatric/Behavioral: Negative.   All other systems reviewed and are negative.   Blood pressure 146/85, pulse 82, temperature 97.9 F (36.6 C), temperature source Oral, resp. rate 30, SpO2 100.00%. Physical Exam  Constitutional: He is oriented to person, place, and time. He appears well-developed.       Obese middle aged man in fair condition- appears to have labored respirations  HENT:  Head: Normocephalic and atraumatic.  Mouth/Throat: Oropharynx is clear and moist. No oropharyngeal exudate.  Eyes: Conjunctivae and EOM are normal. Pupils are equal, round, and reactive to light. No scleral icterus.  Neck: Normal range of motion. Neck supple. JVD present. No tracheal deviation present. No thyromegaly present.       JVP at 11cm  Cardiovascular: Normal rate, regular rhythm and normal heart sounds.  Exam reveals no gallop and no friction rub.   No murmur heard. Respiratory: No stridor. He is in respiratory distress. He has wheezes. He has  rales. He exhibits no tenderness.       Labored respirations and able to speak complete sentences  GI: Soft. Bowel sounds are normal. He exhibits distension. He exhibits no mass. There is no tenderness. There is no rebound and no guarding.  Musculoskeletal: Normal range of motion. He exhibits edema.       2+ LE edema  Lymphadenopathy:    He has no cervical adenopathy.  Neurological: He is alert and oriented to person, place, and time. No cranial nerve  deficit. Coordination normal.  Skin: Skin is warm and dry. No rash noted. He is not diaphoretic. No erythema.  Psychiatric: He has a normal mood and affect. His behavior is normal.    Assessment/Plan: 1. Volume overload in a patient with ESRD on peritoneal dialysis: His elevated BUN and volume overload as well as paucity of information obtained from history taking- suggests probable lapses with compliance. He will be transported to [facility] for admission there as we lack peritoneal dialysis capabilities at this hospital. While in the hospital, I will place him on CCPD with 4.25% dialysate solution and monitor his ultrafiltration/electrolytes overnight. He has received intravenous Lasix in the emergency room that might be beneficial should he have sufficient residual renal function/urine output. I attempted to educate the patient regarding appropriate communication for help in situations during which he experiences difficulties with peritoneal dialysis. 2. Anemia: Check iron stores and restart ESA 3. Metabolic bone disease: Check phosphorus and magnesium levels and restart binders/vitamin D receptor analogs. 4. Hypertension: Temporarily will narrow down on some of his antihypertensive medications as we engage with aggressive ultrafiltration to limit hypotension/orthostatic symptoms.  Vlasta Baskin K. 09/07/2011, 11:33 PM

## 2011-09-07 NOTE — ED Provider Notes (Addendum)
History     CSN: [id number]  Arrival date & time 09/07/11  1812   First MD Initiated Contact with Patient 09/07/11 2003      Chief Complaint  Patient presents with  . Shortness of Breath  . Anxiety    (Consider location/radiation/quality/duration/timing/severity/associated sxs/prior treatment) HPI Comments: Patient is a 51 year old with a history of congestive heart failure, anemia, and kidney disease with peritoneal dialysis since January that presents emergency department with a chief complaint of shortness of breath.  Patient was recently treated for a bronchitis with antibiotics and states that he has been short of breath with cough and fatigue for about 2 weeks and has not returned to baseline.  Patient denies any change in weight, lower leg swelling, leg pain, chest pain, hemoptysis, claudication, history of DVT or PE, urinary symptoms, lightheadedness, fevers, night sweats, or chills.  Patient's cardiologist is at [facility] and he states he has had Lasix before in the past and was going to be placed on again, however this has not yet been started. He was 20 pounds over weight when he was treated for his bronchitis and is presently still holding onto about 15 ponds. He has dialyzed once today and reports that he does still make urine. PCP is with [facility], Dr. Yevette Edwards, Nephrology Dr. Louis Meckel in [place] & per previous note Dr. Hyacinth Meeker at [facility], Cardiology at [facility] ? Spelling Dr. Tyrone Nine.   Patient is a 51 y.o. male presenting with shortness of breath and anxiety. The history is provided by the patient.  Shortness of Breath  Associated symptoms include shortness of breath.  Anxiety    Past Medical History  Diagnosis Date  . Hypertension   . Renal disorder   . CHF (congestive heart failure)   . Peritoneal dialysis catheter in place   . Anemia     patient reporats that last hgb 7.9 a week ago    Past Surgical History  Procedure Date  . Appendectomy      Family History  Problem Relation Age of Onset  . Diabetes Mother   . Hypertension Mother     History  Substance Use Topics  . Smoking status: Never Smoker   . Smokeless tobacco: Not on file  . Alcohol Use: No      Review of Systems  Respiratory: Positive for shortness of breath.   All other systems reviewed and are negative.    Allergies  Prednisone  Home Medications   Current Outpatient Rx  Name Route Sig Dispense Refill  . ASPIRIN 325 MG PO TABS Oral Take 325 mg by mouth daily.      Marland Kitchen NEPHRO-VITE 0.8 MG PO TABS Oral Take 0.8 mg by mouth daily.    Marland Kitchen CALCITRIOL 0.25 MCG PO CAPS Oral Take 0.25 mcg by mouth See admin instructions. Takes 1 capsule daily Monday through friday    . CALCIUM CARBONATE ANTACID 500 MG PO CHEW Oral Chew 1 tablet by mouth 3 (three) times daily with meals.    Marland Kitchen CLONAZEPAM 0.5 MG PO TABS Oral Take 0.5 mg by mouth 2 (two) times daily as needed. anxiety    . CLONIDINE HCL 0.2 MG PO TABS Oral Take 0.2 mg by mouth 2 (two) times daily as needed. Only if blood pressure is elevated    . HYDROCODONE-ACETAMINOPHEN 5-500 MG PO TABS Oral Take 1 tablet by mouth every 6 (six) hours as needed. pain    . LABETALOL HCL 300 MG PO TABS Oral Take 300  mg by mouth 2 (two) times daily.      Marland Kitchen NIFEDIPINE ER 60 MG PO TB24 Oral Take 60 mg by mouth daily.      Marland Kitchen SEVELAMER CARBONATE 800 MG PO TABS Oral Take 800 mg by mouth See admin instructions. Takes 1 tablet tid with meals and snacks      BP 145/78  Pulse 79  Temp 97.9 F (36.6 C) (Oral)  Resp 26  SpO2 93%  Physical Exam  Nursing note and vitals reviewed. Constitutional: He is oriented to person, place, and time. He appears well-developed and well-nourished. No distress.  HENT:  Head: Normocephalic and atraumatic.  Eyes: Conjunctivae and EOM are normal.  Neck: Normal range of motion.  Cardiovascular:       RRR, Heart sounds distant, no pitting edema  Pulmonary/Chest: Effort normal. He has rales.        Bilateral lower lung rales, 90% on 3LNC, not normally on O2 at home. Able to speak full sentences, no nasal flaring or accessory muscle use.   Abdominal:       Full distended abdomen non tender to palpation, non pulsatile aorta.   Musculoskeletal: Normal range of motion.  Neurological: He is alert and oriented to person, place, and time.  Skin: Skin is warm and dry. No rash noted. He is not diaphoretic.  Psychiatric: He has a normal mood and affect. His behavior is normal.    ED Course  Procedures (including critical care time)  Labs Reviewed  CBC - Abnormal; Notable for the following:    RBC 3.05 (*)     Hemoglobin 8.9 (*)     HCT 29.2 (*)     RDW 17.4 (*)     All other components within normal limits  BASIC METABOLIC PANEL - Abnormal; Notable for the following:    Glucose, Bld 113 (*)     BUN 116 (*)     Creatinine, Ser 18.04 (*)     Calcium 7.2 (*)     GFR calc non Af Amer 3 (*)     GFR calc Af Amer 3 (*)     All other components within normal limits  POCT I-STAT TROPONIN I - Abnormal; Notable for the following:    Troponin i, poc 0.10 (*)     All other components within normal limits  PRO B NATRIURETIC PEPTIDE - Abnormal; Notable for the following:    Pro B Natriuretic peptide (BNP) 24858.0 (*)     All other components within normal limits   Dg Chest 2 View  09/07/2011  *RADIOLOGY REPORT*  Clinical Data: Shortness of breath.  Anxiety.  Congestive heart failure.  Hypertension.  CHEST - 2 VIEW  Comparison: 12/07/2009  Findings: Moderate cardiomegaly stable.  New diffuse mixed interstitial and airspace disease is seen bilaterally, consistent with diffuse pulmonary edema.  No evidence of pleural effusion.  IMPRESSION:  Acute pulmonary edema and stable cardiomegaly, consistent with congestive heart failure.  Original Report Authenticated By: Danae Orleans, M.D.    Date: 10/08/2011  Rate: 80  Rhythm: normal sinus rhythm  QRS Axis: normal  Intervals: QT prolonged, borderline   ST/T Wave abnormalities: nonspecific T wave changes  Conduction Disutrbances:none  Narrative Interpretation:   Old EKG Reviewed: changes noted '   No diagnosis found.  Consult to nephrology: Dr. Allena Katz  MDM  CHF, CKD,   Pt w CHF exacerbation w likely need for dialysis. Pt to be admitted by [facility] Dr. Racheal Patches who will transfer to [facility] for  admit. Pending consult to nephrology to discuss possible hemodialysis? Dr. Allena Katz will speak w [facility] for treatment plan.          Jaci Carrel, PA-C 09/07/11 [address] PA-C 10/08/11 0104

## 2011-09-08 ENCOUNTER — Inpatient Hospital Stay (HOSPITAL_COMMUNITY): Payer: Medicare Other

## 2011-09-08 DIAGNOSIS — J9602 Acute respiratory failure with hypercapnia: Secondary | ICD-10-CM | POA: Diagnosis present

## 2011-09-08 DIAGNOSIS — I340 Nonrheumatic mitral (valve) insufficiency: Secondary | ICD-10-CM | POA: Diagnosis present

## 2011-09-08 DIAGNOSIS — D696 Thrombocytopenia, unspecified: Secondary | ICD-10-CM | POA: Diagnosis present

## 2011-09-08 DIAGNOSIS — R0902 Hypoxemia: Secondary | ICD-10-CM

## 2011-09-08 DIAGNOSIS — N186 End stage renal disease: Secondary | ICD-10-CM

## 2011-09-08 DIAGNOSIS — I5021 Acute systolic (congestive) heart failure: Secondary | ICD-10-CM

## 2011-09-08 DIAGNOSIS — E662 Morbid (severe) obesity with alveolar hypoventilation: Secondary | ICD-10-CM | POA: Diagnosis present

## 2011-09-08 DIAGNOSIS — J9600 Acute respiratory failure, unspecified whether with hypoxia or hypercapnia: Secondary | ICD-10-CM

## 2011-09-08 DIAGNOSIS — Z6841 Body Mass Index (BMI) 40.0 and over, adult: Secondary | ICD-10-CM

## 2011-09-08 DIAGNOSIS — I517 Cardiomegaly: Secondary | ICD-10-CM | POA: Diagnosis present

## 2011-09-08 DIAGNOSIS — I10 Essential (primary) hypertension: Secondary | ICD-10-CM

## 2011-09-08 DIAGNOSIS — I272 Pulmonary hypertension, unspecified: Secondary | ICD-10-CM | POA: Diagnosis present

## 2011-09-08 DIAGNOSIS — J811 Chronic pulmonary edema: Secondary | ICD-10-CM | POA: Diagnosis present

## 2011-09-08 DIAGNOSIS — R0689 Other abnormalities of breathing: Secondary | ICD-10-CM | POA: Diagnosis present

## 2011-09-08 DIAGNOSIS — I2789 Other specified pulmonary heart diseases: Secondary | ICD-10-CM

## 2011-09-08 DIAGNOSIS — E669 Obesity, unspecified: Secondary | ICD-10-CM | POA: Diagnosis present

## 2011-09-08 DIAGNOSIS — R748 Abnormal levels of other serum enzymes: Secondary | ICD-10-CM | POA: Diagnosis present

## 2011-09-08 LAB — BLOOD GAS, ARTERIAL
Bicarbonate: 25.1 mEq/L — ABNORMAL HIGH (ref 20.0–24.0)
Drawn by: 305991
Expiratory PAP: 5
FIO2: 0.4 %
Inspiratory PAP: 20
O2 Saturation: 100 %
Patient temperature: 98.6
Patient temperature: 98.6
TCO2: 27 mmol/L (ref 0–100)
pH, Arterial: 7.232 — ABNORMAL LOW (ref 7.350–7.450)
pO2, Arterial: 110 mmHg — ABNORMAL HIGH (ref 80.0–100.0)

## 2011-09-08 LAB — URINALYSIS, MICROSCOPIC ONLY
Bilirubin Urine: NEGATIVE
Ketones, ur: NEGATIVE mg/dL
Protein, ur: >300 mg/dL — AB
Urobilinogen, UA: 0.2 mg/dL (ref 0.0–1.0)

## 2011-09-08 LAB — RENAL FUNCTION PANEL
Albumin: 2.9 g/dL — ABNORMAL LOW (ref 3.5–5.2)
BUN: 122 mg/dL — ABNORMAL HIGH (ref 6–23)
CO2: 23 mEq/L (ref 19–32)
Calcium: 6.9 mg/dL — ABNORMAL LOW (ref 8.4–10.5)
Chloride: 103 mEq/L (ref 96–112)
Creatinine, Ser: 18.07 mg/dL — ABNORMAL HIGH (ref 0.50–1.35)
GFR calc Af Amer: 3 mL/min — ABNORMAL LOW (ref >90)
GFR calc non Af Amer: 3 mL/min — ABNORMAL LOW (ref >90)
Glucose, Bld: 122 mg/dL — ABNORMAL HIGH (ref 70–99)
Phosphorus: 6.4 mg/dL — ABNORMAL HIGH (ref 2.3–4.6)
Potassium: 4.6 mEq/L (ref 3.5–5.1)
Sodium: 142 mEq/L (ref 135–145)

## 2011-09-08 LAB — CBC
MCH: 28.7 pg (ref 26.0–34.0)
MCHC: 30.1 g/dL (ref 30.0–36.0)
Platelets: 123 10*3/uL — ABNORMAL LOW (ref 150–400)
RBC: 2.68 MIL/uL — ABNORMAL LOW (ref 4.22–5.81)

## 2011-09-08 LAB — CARDIAC PANEL(CRET KIN+CKTOT+MB+TROPI)
CK, MB: 8.7 ng/mL (ref 0.3–4.0)
Relative Index: 0.6 (ref 0.0–2.5)
Total CK: 1411 U/L — ABNORMAL HIGH (ref 7–232)
Troponin I: <0.3 ng/mL (ref <0.30)

## 2011-09-08 LAB — DIFFERENTIAL
Basophils Absolute: 0 10*3/uL (ref 0.0–0.1)
Basophils Relative: 0 % (ref 0–1)
Eosinophils Absolute: 0.3 10*3/uL (ref 0.0–0.7)
Neutrophils Relative %: 71 % (ref 43–77)

## 2011-09-08 LAB — FERRITIN: Ferritin: 421 ng/mL — ABNORMAL HIGH (ref 22–322)

## 2011-09-08 LAB — IRON AND TIBC
Iron: 44 ug/dL (ref 42–135)
Saturation Ratios: 16 % — ABNORMAL LOW (ref 20–55)
Saturation Ratios: 17 % — ABNORMAL LOW (ref 20–55)
UIBC: 223 ug/dL (ref 125–400)
UIBC: 229 ug/dL (ref 125–400)

## 2011-09-08 LAB — RETICULOCYTES
RBC.: 2.68 MIL/uL — ABNORMAL LOW (ref 4.22–5.81)
Retic Ct Pct: 6.2 % — ABNORMAL HIGH (ref 0.4–3.1)

## 2011-09-08 LAB — HEPATIC FUNCTION PANEL
ALT: 26 U/L (ref 0–53)
AST: 29 U/L (ref 0–37)
Bilirubin, Direct: 0.1 mg/dL (ref 0.0–0.3)

## 2011-09-08 LAB — SEDIMENTATION RATE: Sed Rate: 76 mm/hr — ABNORMAL HIGH (ref 0–16)

## 2011-09-08 MED ORDER — ONDANSETRON HCL 4 MG/2ML IJ SOLN
4.0000 mg | Freq: Four times a day (QID) | INTRAMUSCULAR | Status: DC | PRN
  Administered 2011-09-08: 4 mg via INTRAVENOUS
  Filled 2011-09-08: qty 2

## 2011-09-08 MED ORDER — SEVELAMER CARBONATE 800 MG PO TABS
800.0000 mg | ORAL_TABLET | Freq: Three times a day (TID) | ORAL | Status: DC
  Administered 2011-09-08 – 2011-09-18 (×24): 800 mg via ORAL
  Filled 2011-09-08 (×32): qty 1

## 2011-09-08 MED ORDER — ACETAMINOPHEN 325 MG PO TABS
650.0000 mg | ORAL_TABLET | ORAL | Status: DC | PRN
  Administered 2011-09-10 – 2011-09-12 (×3): 650 mg via ORAL
  Filled 2011-09-08 (×2): qty 2
  Filled 2011-09-08: qty 1
  Filled 2011-09-08: qty 2

## 2011-09-08 MED ORDER — SEVELAMER CARBONATE 800 MG PO TABS: 800.0000 mg | ORAL_TABLET | ORAL | Status: DC

## 2011-09-08 MED ORDER — DELFLEX-LC/4.25% DEXTROSE 483 MOSM/L IP SOLN
INTRAPERITONEAL | Status: DC
Start: 2011-09-08 — End: 2011-09-09
  Administered 2011-09-08: 2000 mL via INTRAPERITONEAL

## 2011-09-08 MED ORDER — SODIUM CHLORIDE 0.9 % IJ SOLN
3.0000 mL | Freq: Two times a day (BID) | INTRAMUSCULAR | Status: DC
  Administered 2011-09-08 – 2011-09-25 (×23): 3 mL via INTRAVENOUS

## 2011-09-08 MED ORDER — NIFEDIPINE ER 60 MG PO TB24
60.0000 mg | ORAL_TABLET | Freq: Every day | ORAL | Status: DC
  Filled 2011-09-08: qty 1

## 2011-09-08 MED ORDER — CLONAZEPAM 0.5 MG PO TABS
0.5000 mg | ORAL_TABLET | Freq: Two times a day (BID) | ORAL | Status: DC | PRN
  Administered 2011-09-08: 0.5 mg via ORAL
  Filled 2011-09-08: qty 1

## 2011-09-08 MED ORDER — SODIUM CHLORIDE 0.9 % IV SOLN
250.0000 mL | INTRAVENOUS | Status: DC | PRN
  Administered 2011-09-10: 250 mL via INTRAVENOUS

## 2011-09-08 MED ORDER — HYDROCODONE-ACETAMINOPHEN 5-325 MG PO TABS
1.0000 | ORAL_TABLET | Freq: Four times a day (QID) | ORAL | Status: DC | PRN
  Administered 2011-09-08: 1 via ORAL
  Filled 2011-09-08: qty 1

## 2011-09-08 MED ORDER — SEVELAMER CARBONATE 800 MG PO TABS
800.0000 mg | ORAL_TABLET | Freq: Three times a day (TID) | ORAL | Status: DC | PRN
  Administered 2011-09-09 – 2011-09-10 (×2): 800 mg via ORAL
  Filled 2011-09-08: qty 1

## 2011-09-08 MED ORDER — SALINE SPRAY 0.65 % NA SOLN
1.0000 | NASAL | Status: DC | PRN
  Administered 2011-09-08: 1 via NASAL
  Filled 2011-09-08: qty 44

## 2011-09-08 MED ORDER — DEXTROSE 5 % IV SOLN
160.0000 mg | Freq: Two times a day (BID) | INTRAVENOUS | Status: DC
  Administered 2011-09-08 – 2011-09-12 (×9): 160 mg via INTRAVENOUS
  Filled 2011-09-08 (×14): qty 16

## 2011-09-08 MED ORDER — LABETALOL HCL 300 MG PO TABS
300.0000 mg | ORAL_TABLET | Freq: Two times a day (BID) | ORAL | Status: DC
  Administered 2011-09-08: 300 mg via ORAL
  Filled 2011-09-08 (×3): qty 1

## 2011-09-08 MED ORDER — ASPIRIN 325 MG PO TABS
325.0000 mg | ORAL_TABLET | Freq: Every day | ORAL | Status: DC
  Administered 2011-09-08 – 2011-09-25 (×17): 325 mg via ORAL
  Filled 2011-09-08 (×18): qty 1

## 2011-09-08 MED ORDER — RENA-VITE PO TABS
1.0000 | ORAL_TABLET | Freq: Every day | ORAL | Status: DC
  Administered 2011-09-08 – 2011-09-25 (×16): 1 via ORAL
  Filled 2011-09-08 (×18): qty 1

## 2011-09-08 MED ORDER — CALCITRIOL 0.25 MCG PO CAPS: 0.2500 ug | ORAL_CAPSULE | ORAL | Status: DC

## 2011-09-08 MED ORDER — CALCITRIOL 0.25 MCG PO CAPS
0.2500 ug | ORAL_CAPSULE | ORAL | Status: DC
  Administered 2011-09-08 – 2011-09-23 (×10): 0.25 ug via ORAL
  Filled 2011-09-08 (×14): qty 1

## 2011-09-08 MED ORDER — CLONAZEPAM 0.5 MG PO TABS
0.2500 mg | ORAL_TABLET | Freq: Two times a day (BID) | ORAL | Status: DC | PRN
  Administered 2011-09-08: 0.25 mg via ORAL
  Filled 2011-09-08: qty 2

## 2011-09-08 MED ORDER — CALCIUM CARBONATE ANTACID 500 MG PO CHEW
1.0000 | CHEWABLE_TABLET | Freq: Three times a day (TID) | ORAL | Status: DC
  Administered 2011-09-08 – 2011-09-18 (×26): 200 mg via ORAL
  Filled 2011-09-08 (×36): qty 1

## 2011-09-08 MED ORDER — SODIUM CHLORIDE 0.9 % IJ SOLN: 3.0000 mL | INTRAMUSCULAR | Status: DC | PRN

## 2011-09-08 NOTE — Progress Notes (Signed)
CRITICAL VALUE ALERT  Critical value received:  ckmb 8.7  Date of notification:  09/08/11  Time of notification:  0530  Critical value read back:yes  Nurse who received alert:  Leonidas Romberg RN  MD notified (1st page):  Schorr Np  Time of first page:  0530  MD notified (2nd page):  Time of second page:  Responding MD:  Schorr NP  Time MD responded:  [phone]

## 2011-09-08 NOTE — Progress Notes (Signed)
  Echocardiogram 2D Echocardiogram has been performed.  Gary Rivera 09/08/2011, 6:53 PM

## 2011-09-08 NOTE — Consult Note (Signed)
Patient: Gary Rivera DOB: [birth year] Date of Admission: 09/07/2011            Pulmonary consult  Date of Consult: 09/08/2011 MD requesting consult:  Rizwan ([facility])  Reason for consult: respiratory failure   HPI -  51yo male with hx HTN, chronic renal failure on PD, CHF presented 6/19 to ER with c/o ~20 lbs weight gain and SOB.  He was admitted by hospitalist and seen by renal who rx with further PD and lasix.  On 6/20 he developed worsening resp distress requiring bipap and PCCM consulted.   Allergies:  Allergies  Allergen Reactions  . Prednisone Nausea Only    PMH: Past Medical History  Diagnosis Date  . Hypertension   . Renal disorder   . CHF (congestive heart failure)   . Peritoneal dialysis catheter in place   . Anemia     patient reporats that last hgb 7.9 a week ago    Home meds: Medications Prior to Admission  Medication Sig Dispense Refill  . aspirin 325 MG tablet Take 325 mg by mouth daily.        Marland Kitchen b complex-vitamin c-folic acid (NEPHRO-VITE) 0.8 MG TABS Take 0.8 mg by mouth daily.      . calcitRIOL (ROCALTROL) 0.25 MCG capsule Take 0.25 mcg by mouth See admin instructions. Takes 1 capsule daily Monday through friday      . calcium carbonate (TUMS - DOSED IN MG ELEMENTAL CALCIUM) 500 MG chewable tablet Chew 1 tablet by mouth 3 (three) times daily with meals.      . clonazePAM (KLONOPIN) 0.5 MG tablet Take 0.5 mg by mouth 2 (two) times daily as needed. anxiety      . cloNIDine (CATAPRES) 0.2 MG tablet Take 0.2 mg by mouth 2 (two) times daily as needed. Only if blood pressure is elevated      . HYDROcodone-acetaminophen (VICODIN) 5-500 MG per tablet Take 1 tablet by mouth every 6 (six) hours as needed. pain      . labetalol (NORMODYNE) 300 MG tablet Take 300 mg by mouth 2 (two) times daily.        Marland Kitchen NIFEdipine (PROCARDIA-XL/ADALAT CC) 60 MG 24 hr tablet Take 60 mg by mouth daily.        . sevelamer (RENVELA) 800 MG tablet Take 800 mg by mouth See admin instructions.  Takes 1 tablet tid with meals and snacks         Social Hx: History   Social History  . Marital Status: Single    Spouse Name: N/A    Number of Children: N/A  . Years of Education: N/A   Occupational History  . Not on file.   Social History Main Topics  . Smoking status: Never Smoker   . Smokeless tobacco: Not on file  . Alcohol Use: No  . Drug Use: No  . Sexually Active:    Other Topics Concern  . Not on file   Social History Narrative  . No narrative on file     Family Hx: Family History  Problem Relation Age of Onset  . Diabetes Mother   . Hypertension Mother   . Hypertension Sister      ROS: Review of Systems - c/o SOB this am but improved.  Denies chest pain, abd pain, nausea.  All other systems reviewed and were neg.   Filed Vitals:   09/08/11 0810 09/08/11 1222 09/08/11 1459 09/08/11 1526  BP: 136/77 137/87 135/97 135/97  Pulse:  75 76  71  Temp:  98.8 F (37.1 C)    TempSrc:  Oral    Resp:  31 26 26   Height:      Weight:      SpO2:  100% 99% 96%    chest X-ray Dg Chest 2 View  09/07/2011  *RADIOLOGY REPORT*  Clinical Data: Shortness of breath.  Anxiety.  Congestive heart failure.  Hypertension.  CHEST - 2 VIEW  Comparison: 12/07/2009  Findings: Moderate cardiomegaly stable.  New diffuse mixed interstitial and airspace disease is seen bilaterally, consistent with diffuse pulmonary edema.  No evidence of pleural effusion.  IMPRESSION:  Acute pulmonary edema and stable cardiomegaly, consistent with congestive heart failure.  Original Report Authenticated By: Danae Orleans, M.D.     CBC    Component Value Date/Time   WBC 5.5 09/08/2011 0300   RBC 2.68* 09/08/2011 0300   HGB 7.7* 09/08/2011 0300   HCT 25.6* 09/08/2011 0300   PLT 123* 09/08/2011 0300   MCV 95.5 09/08/2011 0300   MCH 28.7 09/08/2011 0300   MCHC 30.1 09/08/2011 0300   RDW 17.4* 09/08/2011 0300   LYMPHSABS 1.0 09/08/2011 0300   MONOABS 0.3 09/08/2011 0300   EOSABS 0.3 09/08/2011 0300    BASOSABS 0.0 09/08/2011 0300     BMET    Component Value Date/Time   NA 142 09/08/2011 0300   K 4.6 09/08/2011 0300   CL 103 09/08/2011 0300   CO2 23 09/08/2011 0300   GLUCOSE 122* 09/08/2011 0300   BUN 122* 09/08/2011 0300   CREATININE 18.07* 09/08/2011 0300   CALCIUM 6.9* 09/08/2011 0300   CALCIUM 8.0* 12/04/2009 0455   GFRNONAA 3* 09/08/2011 0300   GFRAA 3* 09/08/2011 0300     ABG    Component Value Date/Time   PHART 7.232* 09/08/2011 1415   PCO2ART 61.8* 09/08/2011 1415   PO2ART 72.6* 09/08/2011 1415   HCO3 25.1* 09/08/2011 1415   TCO2 27.0 09/08/2011 1415   ACIDBASEDEF 1.5 09/08/2011 1415   O2SAT 91.4 09/08/2011 1415      EXAM: General:  Obese male, NAD on bipap Neuro: drowsy, easily arousable, appropriate, follows commands, falls back to sleep easily  CV: s1s2 rrr PULM: resps even non labored on bipap, few scattered crackles GI: abd obese, round, mildly distended, PD cath Extremities:  Warm and dry, no edema    ASSESSMENT AND PLAN  PULMONARY  Lab 09/08/11 1415  PHART 7.232*  PCO2ART 61.8*  PO2ART 72.6*  HCO3 25.1*  O2SAT 91.4   Ventilator Settings: Vent Mode:  [-]  FiO2 (%):  [50 %] 50 % CXR:  CM, edema pattern ETT:    A:  Acute on chronic hypercarbic resp failure -- multifactorial r/t decompensated CHF c/b ESRD (inability to compensate metabolically) with likely underlying OSA/OHS, pulm HTN.  Apparently much improved after institution of NPPV P:   -OK to remain on SDU -Change BiPAP to PRN for excessive WOB, decreasing LOC (indicating progressive hypercarbia) or worsening hypoxemia. Would avoid excessive ABG draws as they are usually not necessary -Nocturnal CPAP for presumed OSA -F/u CXR 6/20 >>  -Volume removal - see below   CARDIOVASCULAR  Lab 09/08/11 0247 09/07/11 2000  TROPONINI <0.30 --  LATICACIDVEN -- --  PROBNP -- 47425.9*   ECG:  Nsr, no acute ischemia Lines: none  A: Acute on chronic systolic CHF - EF 40-45% (2011 echo) Moderate mitral  regurgitation  Pulm HTN (pa sys 2011) P:  2D echo pending  Cont high dose lasix - does  not seem to be responding - see renal   RENAL  Lab 09/08/11 0300 09/07/11 2000  NA 142 140  K 4.6 4.3  CL 103 99  CO2 23 25  BUN 122* 116*  CREATININE 18.07* 18.04*  CALCIUM 6.9* 7.2*  MG -- --  PHOS 6.4* --    Intake/Output Summary (Last 24 hours) at 09/08/11 1606 Last data filed at 09/08/11 1300  Gross per 24 hour  Intake    666 ml  Output      0 ml  Net    666 ml    Foley:  6/19  A:  ESRD - on PD with acute volume overload in setting systolic CHF P:   Renal managing     GASTROINTESTINAL  Lab 09/08/11 1343 09/08/11 0300  AST 29 --  ALT 26 --  ALKPHOS 48 --  BILITOT 0.3 --  PROT 6.5 --  ALBUMIN 3.1* 2.9*    A:  No acute issues P:   monitor with PD   HEMATOLOGIC  Lab 09/08/11 0300 09/07/11 2000  HGB 7.7* 8.9*  HCT 25.6* 29.2*  PLT 123* 123*  INR -- --  APTT -- --   A:  Anemia Thrombocytopenia  P:  No s/s bleeding Likely r/t chronic disease Transfuse for hgb <7 Cont aranesp   INFECTIOUS  Lab 09/08/11 0300 09/07/11 2000  WBC 5.5 4.0  PROCALCITON -- --   Cultures: BCx2 6/20 >>  Antibiotics: none  A:  No obvious infectious etology P:   Monitor fever, wbc curve    ENDOCRINE  A:  No acute issues  P:   Monitor glucose on bmet    NEUROLOGIC  A:  AMS/ lethargy - likely in setting hypercarbia. Improved with bipap.  P: Cont bipap as above Avoid sedating meds or minimize - esp opiates If mental status worsens/ unable to protect airway will need ETT  BEST PRACTICE / DISPOSITION Level of Care:  SDU Primary Service:  [facility] Consultants:  renal Code Status:  full Diet:  npo DVT Px: SCDs GI Px:  none Skin Integrity:  intact Social / Family:  Pt updates at length, no family available    [facility], NP 09/08/2011  3:46 PM Pager: [phone]    Seen, examined and agree with above Billy Fischer, MD;  PCCM service;  Mobile [phone]

## 2011-09-08 NOTE — Progress Notes (Signed)
09/08/2011 8:16 AM Called to room around 0730 for cycler alarm going off.  Upon entering room, patient had made it through initial drain, which only got 10cc off patient. First fill completed with 2L instilled in patient.  cycler thermal alarm and scale appear to be off.  Multiple attempts made at resetting the cycler, with no success.  Dr. Arlean Hopping notified, and orders were received to switch patient to CAPD.  First CAPD exchange started at 0810. Please see flowsheet for additional information.  Will continue to monitor patient.  Eunice Blase

## 2011-09-08 NOTE — ED Notes (Addendum)
Tech attempted to place foley in. Pt refused. Pt voided 100 ml of urine in urinal.

## 2011-09-08 NOTE — Progress Notes (Signed)
09/08/2011 3:29 PM  Notified Dr. Darrick Penna at 1245 of net volume removed from patient after first CAPD exchange.  Orders received to remain on CAPD for now since net UF was adequate.  Dr. Kathrene Bongo later notified as well.  No new orders.  Will continue to monitor patient.  Eunice Blase

## 2011-09-08 NOTE — Progress Notes (Signed)
Critical abg result called per resp therapist Lupita Leash pH-7.23, CO2 61.8, PO2 72.6, Bicarb 25.1 Base excess 1.5 Sat 91.4% results called to Junious Silk NP. New orders entered

## 2011-09-08 NOTE — Progress Notes (Signed)
Utilization Review Completed.  Gary Rivera T.  09/08/2011  

## 2011-09-08 NOTE — Progress Notes (Signed)
09/08/2011 8:33 AM  Prior to first CAPD fill, an additional 150cc was drained from the patient from being on the cycler previously.    Gary Rivera

## 2011-09-08 NOTE — Progress Notes (Signed)
Subjective:  Says he feels better.  Had cycler malfunction, unable to tell how much UF and now stuck with using manual exchanges.  Complains of some cramping Objective Vital signs in last 24 hours: Filed Vitals:   09/08/11 0547 09/08/11 0600 09/08/11 0745 09/08/11 0810  BP:  142/88 137/85 136/77  Pulse: 76 75 73   Temp:   98.3 F (36.8 C)   TempSrc:   Oral   Resp: 27 28 33   Height:      Weight:      SpO2: 95% 97% 95%    Weight change:   Intake/Output Summary (Last 24 hours) at 09/08/11 0905 Last data filed at 09/07/11 2135  Gross per 24 hour  Intake    240 ml  Output      0 ml  Net    240 ml   Labs: Basic Metabolic Panel:  Lab 09/08/11 7829 09/07/11 2000  NA 142 140  K 4.6 4.3  CL 103 99  CO2 23 25  GLUCOSE 122* 113*  BUN 122* 116*  CREATININE 18.07* 18.04*  CALCIUM 6.9* 7.2*  ALB -- --  PHOS 6.4* --   Liver Function Tests:  Lab 09/08/11 0300  AST --  ALT --  ALKPHOS --  BILITOT --  PROT --  ALBUMIN 2.9*   No results found for this basename: LIPASE:3,AMYLASE:3 in the last 168 hours No results found for this basename: AMMONIA:3 in the last 168 hours CBC:  Lab 09/08/11 0300 09/07/11 2000  WBC 5.5 4.0  NEUTROABS 4.0 --  HGB 7.7* 8.9*  HCT 25.6* 29.2*  MCV 95.5 95.7  PLT 123* 123*   Cardiac Enzymes:  Lab 09/08/11 0247  CKTOTAL 1411*  CKMB 8.7*  CKMBINDEX --  TROPONINI <0.30   CBG: No results found for this basename: GLUCAP:5 in the last 168 hours  Iron Studies: No results found for this basename: IRON,TIBC,TRANSFERRIN,FERRITIN in the last 72 hours Studies/Results: Dg Chest 2 View  09/07/2011  *RADIOLOGY REPORT*  Clinical Data: Shortness of breath.  Anxiety.  Congestive heart failure.  Hypertension.  CHEST - 2 VIEW  Comparison: 12/07/2009  Findings: Moderate cardiomegaly stable.  New diffuse mixed interstitial and airspace disease is seen bilaterally, consistent with diffuse pulmonary edema.  No evidence of pleural effusion.  IMPRESSION:  Acute  pulmonary edema and stable cardiomegaly, consistent with congestive heart failure.  Original Report Authenticated By: Danae Orleans, M.D.   Medications: Infusions:    . dialysis solution 4.25% low-MG/low-CA 2,000 mL (09/08/11 0826)  . DISCONTD: dialysis solution 4.25% low-MG      Scheduled Medications:    . aspirin  325 mg Oral Daily  . calcitRIOL  0.25 mcg Oral See admin instructions  . calcium carbonate  1 tablet Oral TID WC  . darbepoetin (ARANESP) injection - DIALYSIS  150 mcg Subcutaneous Q Thu-HD  . dialysis solution 2.5% low-MG/low-CA   Intraperitoneal Once in dialysis  . dialysis solution 4.25% low-MG/low-CA   Intraperitoneal Once in dialysis  . furosemide      . furosemide  120 mg Intravenous Once  . furosemide  40 mg Intravenous Once  . labetalol  300 mg Oral BID  . multivitamin  1 tablet Oral Daily  . NIFEdipine  60 mg Oral Daily  . sevelamer  800 mg Oral See admin instructions  . sodium chloride  3 mL Intravenous Q12H    have reviewed scheduled and prn medications.  Physical Exam: General: somnolent but arousable.  Habitus for OSA, sats drop  off of mask Heart: RRR Lungs: decreased BS at bases Abdomen: obese, positive BS Extremities: pitting edema Dialysis Access: PD cath, looks clean and dry   I Assessment/ Plan: Pt is a 51 y.o. yo male who was admitted on 09/07/2011 with  ESRD/PD- volume overload and uremia  Assessment/Plan: 1. CHF/volume overload- patient says was doing 2.5% exchanges at home prior to admission.  Now trying with q 4 hour 4.25% exchanges for aggressive volume removal.  Would be ideal if could get cycler to work so could do q2 or 3 hour exchanges.  Will need to monitor UF closely to make sure are achieving what we need to achieve.  Will d/c BP meds to give [place] more BP cushion.  Pt says is making urine so will keep lasix as well. I agree that positive cardiac enzymes likely do not represent cardiac ischemia 2. ESRD - PD.  Says that he loves it and  is not considering change to HD.  It sounds like he got sick with a virus, likely missed some exchanges and then got behind the 8 ball.  Hopefully with aggressive exchanges in house can tune things up.  3. Anemia- continue aranesp.  Iron stores in process.  Consider transfusion but only if hgb gets below 7-7.5 4. Secondary hyperparathyroidism- continue calcitriol and renvela and tums, renavite 5. HTN/volume- as above attempting aggressive UF, will give low dose clonazepam for cramping  Beza Steppe A   09/08/2011,9:05 AM  LOS: 1 day

## 2011-09-08 NOTE — Progress Notes (Signed)
[facility]  Interval History: Patient has history of HTN and ESRD on PD. In the past patient had received his care at [facility] and had been hospitalized there about a year ago with fluid overload and possible CHF. Recently he been going to renal doctor at [facility] He gets peritoneal dialysis 4 times a day. He has noticed a gain of about 20 pounds recently and started to have worsening shortness of breath for past 24 hours. He presented to [facility] emergency department and was found to be fluid overloaded. Hospitalist was called to admit. Dr. Allena Katz with renal recommended transferred to [facility]. Initial treatment plan is to initiate peritoneal dialysis data for now will give high-dose Lasix since this patient continues to produce urine. He denies any abdominal pain nausea vomiting and chest pain. Patient has no HD dialyses access at this point.  Subjective: Alert and although denies shortness of breath appears to be quite dyspneic despite resting in the bed and having oxygen reapplied. Since my initial evaluation of the patient he has been increased from nasal cannula oxygen to Ventimask and although he is maintaining saturations of 100% he is still somewhat tachypneic. He currently denies chest pain and clarifies that his initial diagnosis and treatment for his renal disease as well as heart failure were completed at [facility] but because his doctor no longer works there he [place]  Objective: Vital signs in last 24 hours: Temp:  [97.9 F (36.6 C)-99 F (37.2 C)] 98.8 F (37.1 C) (06/20 1222) Pulse Rate:  [73-86] 75  (06/20 1222) Resp:  [18-33] 31  (06/20 1222) BP: (136-158)/(71-91) 137/87 mmHg (06/20 1222) SpO2:  [82 %-100 %] 100 % (06/20 1222) FiO2 (%):  [50 %] 50 % (06/20 0230) Weight:  [151.3 kg (333 lb 8.9 oz)] 151.3 kg (333 lb 8.9 oz) (06/20 0218) Weight change:  Last BM Date: 09/07/11  Intake/Output  from previous day: 06/19 0701 - 06/20 0700 In: 240 [P.O.:240] Out: -  Intake/Output this shift: Total I/O In: 120 [P.O.:120] Out: -   General appearance: alert, cooperative, appears older than stated age, mild distress and morbidly obese Resp: clear to auscultation bilaterally with bibasilar crackles, 4 L nasal cannula oxygen applied and patient with saturations 95%-since her evaluation this morning the patient has been placed back on Ventimask with 15 L bleed in and is saturating 100%. Cardio: regular rate and sign rhythm, S1, S2 normal, no murmur, click, rub or gallop GI: soft and obese but nondistended, non-tender; bowel sounds normal; no masses,  no organomegaly Extremities: extremities normal, atraumatic, no cyanosis but does have 2+ lower extremity pitting edema Neurologic: Grossly normal  Lab Results:  Basename 09/08/11 0300 09/07/11 2000  WBC 5.5 4.0  HGB 7.7* 8.9*  HCT 25.6* 29.2*  PLT 123* 123*   BMET  Basename 09/08/11 0300 09/07/11 2000  NA 142 140  K 4.6 4.3  CL 103 99  CO2 23 25  GLUCOSE 122* 113*  BUN 122* 116*  CREATININE 18.07* 18.04*  CALCIUM 6.9* 7.2*    Studies/Results: Dg Chest 2 View  09/07/2011  *RADIOLOGY REPORT*  Clinical Data: Shortness of breath.  Anxiety.  Congestive heart failure.  Hypertension.  CHEST - 2 VIEW  Comparison: 12/07/2009  Findings: Moderate cardiomegaly stable.  New diffuse mixed interstitial and airspace disease is seen bilaterally, consistent with diffuse pulmonary edema.  No evidence of pleural effusion.  IMPRESSION:  Acute pulmonary edema and stable cardiomegaly, consistent  with congestive heart failure.  Original Report Authenticated By: Danae Orleans, M.D.    Medications: I have reviewed the patient's current medications.  Assessment/Plan:  Principal Problem:  *Acute respiratory failure with hypoxia and hypercarbia *Primary etiology seems to be acute decompensated systolic heart failure given a 20 pound weight gain  recently *Treat underlying causes *May have a degree of chronic hypoxemia related to obesity hypoventilation syndrome and/or sequelae from pulmonary hypertension *He endorses he has undergone a CT scan of the chest at [facility] and states no problems were found * ABG today to Mr. respiratory acidosis with hypercarbia: PH 7.23 and PCO2 61.8. *Orders given to apply BiPAP and repeat ABG in one hour *Pulmonary critical care medicine consulted  Active Problems:  Systolic congestive heart failure with reduced left ventricular function, NYHA class 1- EF 40-45% 2011 ECHO *Echocardiogram pending this admission *Echocardiogram from 2011 demonstrated mild systolic dysfunction with inferior hypokinesis with moderate dilatation and moderate LVH *Does not appear to be responding to high-dose Lasix (160 mg IV q 12 hrs) -nephrology is managing   Elevated CPK: myositis vs rhabdomyolosis *Etiology uncertain given the fact patient is presenting with the appearance of volume overload status *CPK is actually increased since admission *Troponin is normal and otherwise CPKs disproportionately elevated *We'll check an ESR and LDH to see if that gives clarity and repeat CPK in the morning   Thrombocytopenia *Etiology also unclear given the fact in 2011 platelets were normal *Only potentially offending medications his one full dose aspirin daily *We will rule out gram-negative infectious etiology so we'll check a urinalysis with culture as well as check blood cultures   CKD (chronic kidney disease) requiring chronic PERITONEAL dialysis *Appreciate nephrology assistant *PD limited by the fact that the cycler needs repair and current PT is being performed annually which limits accurate output calculation   HTN (hypertension)   ? Right heart failure/Pulmonary HTN- 50 mmHg 2011 ECHO/ Mitral regurgitation (MODERATE) *This is also associated with moderate to severe tricuspid regurgitation as well as moderate to  severely dilated right atrium and mildly dilated right ventricle which is concerning for underlying right heart failure *Followup on repeat echocardiogram this admission to see if has had progression *Consider pulmonology consult   LVH (left ventricular hypertrophy) MODERATE *May also have underlying diastolic dysfunction so we'll need to follow up on repeat echo this admission   Anemia *Primarily mediated by chronic kidney disease *Continue Aranesp *Would like to keep hemoglobin greater than 7 if possible transfuse as needed   Morbid obesity with BMI of 40.0-44.9, adult (42.8) *Concern is for underlying obesity hypoventilation syndrome as well as sleep apnea *This may also warrant formal pulmonology evaluation this admission    Disposition *Because of need for high flow oxygen her persistent tachypnea and dyspnea we'll keep in step down unit for now *Waiting on record from [facility] in an attempt to avoid duplication of service and to help clarify underlying chronic conditions   LOS: 1 day   Junious Silk, ANP pager [phone]  [facility] 1 [email or web address] Password: [email or web address]  09/08/2011, 1:37 PM  I have examined the patient and reviewed the chart and agree with the above note.   Calvert Cantor, MD [phone]

## 2011-09-08 NOTE — Progress Notes (Signed)
2nd ABG results  PH 7.256, CO2 59.7, PO2 110, Bicarb 25.6, Base excess -0.6, Sat 100% called to Dr. Butler Denmark. Dr. Delford Field with e link made aware. No new orders at this time. Patient continues to be on bipap.

## 2011-09-08 NOTE — Progress Notes (Signed)
Patient fill time and drain time increased to 30 minutes due to slow drain and fill. Effluent amber in color. No sediments.

## 2011-09-08 NOTE — Progress Notes (Signed)
Received into 2605 via [facility] from [facility] ED. Admitted earlier this evening for CHF/Hypoxia after presenting to [facility] for increasing SOB over last 24 hrs and wt gain. Pt is on PD QID.  Subjective: Currently pt denies specific c/o's. Requesting something to eat. Objective: At bedside pt noted resting in NAD. Pt is AA&O x 3. T-98.5, P-85 w/ RRR, R-28 w/ mildly increased WOB, 02 sats 96% on 50% V-Mask. BBS diminished w/ faint crackles in bases bil. PPP strong and equal bil. Abd large and soft w/ normal bs. PD catheter site noted w/o s&s of redness or inflammation. MAEx4. BLE's w/ 1+ edema bil, otherwise normal Assessment/Plan: Post transfer assessment reveals no acute changes in pt's status. Pt is currently being prepared for CCPD per Dr Jill Alexanders' instructions. Meal given. Will continue care per admission plan w/ nephrology consulting and continue to monitor closely.

## 2011-09-09 ENCOUNTER — Inpatient Hospital Stay (HOSPITAL_COMMUNITY): Payer: Medicare Other

## 2011-09-09 DIAGNOSIS — N186 End stage renal disease: Secondary | ICD-10-CM

## 2011-09-09 DIAGNOSIS — J811 Chronic pulmonary edema: Secondary | ICD-10-CM

## 2011-09-09 DIAGNOSIS — I5020 Unspecified systolic (congestive) heart failure: Secondary | ICD-10-CM

## 2011-09-09 DIAGNOSIS — E662 Morbid (severe) obesity with alveolar hypoventilation: Secondary | ICD-10-CM

## 2011-09-09 DIAGNOSIS — N189 Chronic kidney disease, unspecified: Secondary | ICD-10-CM

## 2011-09-09 DIAGNOSIS — I509 Heart failure, unspecified: Secondary | ICD-10-CM

## 2011-09-09 LAB — BLOOD GAS, ARTERIAL
Acid-base deficit: 0.7 mmol/L (ref 0.0–2.0)
Bicarbonate: 26.6 mEq/L — ABNORMAL HIGH (ref 20.0–24.0)
Delivery systems: POSITIVE
Drawn by: 35135
Drawn by: 29017
Expiratory PAP: 5
Expiratory PAP: 10
FIO2: 0.5 %
FIO2: 0.5 %
Inspiratory PAP: 20
Inspiratory PAP: 20
O2 Saturation: 91 %
O2 Saturation: 94.2 %
Patient temperature: 97.3
RATE: 8 resp/min
TCO2: 28.8 mmol/L (ref 0–100)
pCO2 arterial: 69.4 mmHg (ref 35.0–45.0)
pH, Arterial: 7.203 — ABNORMAL LOW (ref 7.350–7.450)
pO2, Arterial: 66 mmHg — ABNORMAL LOW (ref 80.0–100.0)
pO2, Arterial: 75.6 mmHg — ABNORMAL LOW (ref 80.0–100.0)

## 2011-09-09 LAB — CARDIAC PANEL(CRET KIN+CKTOT+MB+TROPI)
Relative Index: 0.7 (ref 0.0–2.5)
Relative Index: 0.7 (ref 0.0–2.5)
Total CK: 1379 U/L — ABNORMAL HIGH (ref 7–232)
Total CK: 1186 U/L — ABNORMAL HIGH (ref 7–232)
Troponin I: <0.3 ng/mL (ref <0.30)
Troponin I: <0.3 ng/mL (ref <0.30)

## 2011-09-09 LAB — POCT I-STAT 3, ART BLOOD GAS (G3+)
Bicarbonate: 29.5 mEq/L — ABNORMAL HIGH (ref 20.0–24.0)
Bicarbonate: 27.6 mEq/L — ABNORMAL HIGH (ref 20.0–24.0)
O2 Saturation: 66 %
TCO2: 32 mmol/L (ref 0–100)
pCO2 arterial: 80.1 mmHg (ref 35.0–45.0)
pH, Arterial: 7.266 — ABNORMAL LOW (ref 7.350–7.450)
pO2, Arterial: 46 mmHg — ABNORMAL LOW (ref 80.0–100.0)

## 2011-09-09 LAB — BASIC METABOLIC PANEL
BUN: 110 mg/dL — ABNORMAL HIGH (ref 6–23)
Creatinine, Ser: 18.2 mg/dL — ABNORMAL HIGH (ref 0.50–1.35)
GFR calc Af Amer: 3 mL/min — ABNORMAL LOW (ref >90)
GFR calc non Af Amer: 3 mL/min — ABNORMAL LOW (ref >90)

## 2011-09-09 LAB — RENAL FUNCTION PANEL
GFR calc Af Amer: 3 mL/min — ABNORMAL LOW (ref >90)
Glucose, Bld: 86 mg/dL (ref 70–99)
Phosphorus: 5.4 mg/dL — ABNORMAL HIGH (ref 2.3–4.6)
Potassium: 4.8 mEq/L (ref 3.5–5.1)
Sodium: 142 mEq/L (ref 135–145)

## 2011-09-09 LAB — URINE CULTURE
Colony Count: NO GROWTH
Culture  Setup Time: 201306202245
Special Requests: NORMAL

## 2011-09-09 LAB — FERRITIN: Ferritin: 482 ng/mL — ABNORMAL HIGH (ref 22–322)

## 2011-09-09 LAB — CBC
MCHC: 29.1 g/dL — ABNORMAL LOW (ref 30.0–36.0)
RDW: 17.2 % — ABNORMAL HIGH (ref 11.5–15.5)

## 2011-09-09 LAB — TSH: TSH: 2.213 u[IU]/mL (ref 0.350–4.500)

## 2011-09-09 LAB — GLUCOSE, CAPILLARY

## 2011-09-09 MED ORDER — DEXTROSE 5 % IV SOLN
INTRAVENOUS | Status: DC
  Administered 2011-09-09 – 2011-09-11 (×3): via INTRAVENOUS_CENTRAL
  Filled 2011-09-09 (×9): qty 1500

## 2011-09-09 MED ORDER — PRISMASOL BGK 4/2.5 32-4-2.5 MEQ/L IV SOLN
INTRAVENOUS | Status: DC
  Administered 2011-09-09 – 2011-09-11 (×11): via INTRAVENOUS_CENTRAL
  Filled 2011-09-09 (×17): qty 5000

## 2011-09-09 MED ORDER — BIOTENE DRY MOUTH MT LIQD
15.0000 mL | Freq: Four times a day (QID) | OROMUCOSAL | Status: DC
  Administered 2011-09-10 – 2011-09-15 (×21): 15 mL via OROMUCOSAL

## 2011-09-09 MED ORDER — ETOMIDATE 2 MG/ML IV SOLN
20.0000 mg | Freq: Once | INTRAVENOUS | Status: AC
  Administered 2011-09-09: 20 mg via INTRAVENOUS

## 2011-09-09 MED ORDER — DEXTROSE 50 % IV SOLN
INTRAVENOUS | Status: AC
  Administered 2011-09-09: 25 mL via INTRAVENOUS
  Filled 2011-09-09: qty 50

## 2011-09-09 MED ORDER — FENTANYL CITRATE 0.05 MG/ML IJ SOLN
INTRAMUSCULAR | Status: AC
  Administered 2011-09-09: 100 ug
  Filled 2011-09-09: qty 2

## 2011-09-09 MED ORDER — PROPOFOL 10 MG/ML IV EMUL
5.0000 ug/kg/min | INTRAVENOUS | Status: DC
  Administered 2011-09-09: 25 ug/kg/min via INTRAVENOUS
  Administered 2011-09-10: 13 ug/kg/min via INTRAVENOUS
  Administered 2011-09-10: 1.946 ug/kg/min via INTRAVENOUS
  Administered 2011-09-10: 14.916 ug/kg/min via INTRAVENOUS
  Administered 2011-09-11: 13 ug/kg/min via INTRAVENOUS
  Administered 2011-09-11: 8.647 ug/kg/min via INTRAVENOUS
  Administered 2011-09-11: 15 ug/kg/min via INTRAVENOUS
  Administered 2011-09-12: 16 ug/kg/min via INTRAVENOUS
  Filled 2011-09-09 (×9): qty 100

## 2011-09-09 MED ORDER — CALCIUM GLUCONATE 10 % IV SOLN
1.0000 g | Freq: Once | INTRAVENOUS | Status: AC
  Administered 2011-09-09: 1 g via INTRAVENOUS
  Filled 2011-09-09: qty 10

## 2011-09-09 MED ORDER — MIDAZOLAM HCL 2 MG/2ML IJ SOLN
2.0000 mg | INTRAMUSCULAR | Status: DC | PRN
  Administered 2011-09-09: 4 mg via INTRAVENOUS
  Administered 2011-09-09 (×5): 2 mg via INTRAVENOUS
  Filled 2011-09-09 (×2): qty 2
  Filled 2011-09-09: qty 4
  Filled 2011-09-09 (×3): qty 2

## 2011-09-09 MED ORDER — INSULIN ASPART 100 UNIT/ML IV SOLN: 10.0000 [IU] | Freq: Once | INTRAVENOUS | Status: DC

## 2011-09-09 MED ORDER — CHLORHEXIDINE GLUCONATE 0.12 % MT SOLN
15.0000 mL | Freq: Two times a day (BID) | OROMUCOSAL | Status: DC
  Administered 2011-09-09 – 2011-09-15 (×12): 15 mL via OROMUCOSAL
  Filled 2011-09-09 (×12): qty 15

## 2011-09-09 MED ORDER — INSULIN ASPART 100 UNIT/ML ~~LOC~~ SOLN
10.0000 [IU] | Freq: Once | SUBCUTANEOUS | Status: AC
  Administered 2011-09-09: 10 [IU] via INTRAVENOUS

## 2011-09-09 MED ORDER — SODIUM CHLORIDE 0.9 % IV SOLN
50.0000 ug/h | INTRAVENOUS | Status: DC
  Administered 2011-09-09: 50 ug/h via INTRAVENOUS
  Administered 2011-09-09: 400 ug/h via INTRAVENOUS
  Administered 2011-09-09: 250 ug/h via INTRAVENOUS
  Administered 2011-09-10 (×2): 200 ug/h via INTRAVENOUS
  Filled 2011-09-09 (×6): qty 50

## 2011-09-09 MED ORDER — MIDAZOLAM HCL 2 MG/2ML IJ SOLN
INTRAMUSCULAR | Status: AC
  Filled 2011-09-09: qty 2

## 2011-09-09 MED ORDER — FENTANYL BOLUS VIA INFUSION
50.0000 ug | Freq: Four times a day (QID) | INTRAVENOUS | Status: DC | PRN
  Administered 2011-09-09: 150 ug via INTRAVENOUS
  Administered 2011-09-09: 100 ug via INTRAVENOUS
  Filled 2011-09-09: qty 100

## 2011-09-09 MED ORDER — DEXTROSE 5 % IV SOLN
20.0000 g | INTRAVENOUS | Status: DC
  Administered 2011-09-09 – 2011-09-10 (×2): 20 g via INTRAVENOUS_CENTRAL
  Filled 2011-09-09 (×6): qty 200

## 2011-09-09 MED ORDER — FENTANYL CITRATE 0.05 MG/ML IJ SOLN
INTRAMUSCULAR | Status: AC
  Administered 2011-09-09: 05:00:00
  Filled 2011-09-09: qty 2

## 2011-09-09 MED ORDER — SODIUM BICARBONATE 8.4 % IV SOLN
50.0000 meq | Freq: Once | INTRAVENOUS | Status: AC
  Administered 2011-09-09: 50 meq via INTRAVENOUS
  Filled 2011-09-09: qty 50

## 2011-09-09 MED ORDER — PRISMASOL BGK 0/2.5 32-2.5 MEQ/L IV SOLN
INTRAVENOUS | Status: DC
  Administered 2011-09-09 – 2011-09-11 (×2): via INTRAVENOUS_CENTRAL
  Filled 2011-09-09 (×3): qty 5000

## 2011-09-09 MED ORDER — SODIUM POLYSTYRENE SULFONATE 15 GM/60ML PO SUSP
30.0000 g | Freq: Once | ORAL | Status: DC
  Filled 2011-09-09: qty 120

## 2011-09-09 MED ORDER — HEPARIN SODIUM (PORCINE) 1000 UNIT/ML DIALYSIS
1000.0000 [IU] | INTRAMUSCULAR | Status: DC | PRN
  Filled 2011-09-09: qty 3

## 2011-09-09 MED ORDER — VECURONIUM BROMIDE 10 MG IV SOLR
INTRAVENOUS | Status: AC
  Filled 2011-09-09: qty 10

## 2011-09-09 MED ORDER — MIDAZOLAM HCL 2 MG/2ML IJ SOLN
2.0000 mg | Freq: Once | INTRAMUSCULAR | Status: AC
  Administered 2011-09-09: 4 mg via INTRAVENOUS

## 2011-09-09 MED ORDER — SODIUM CHLORIDE 0.9 % FOR CRRT
INTRAVENOUS_CENTRAL | Status: DC | PRN
  Filled 2011-09-09: qty 1000

## 2011-09-09 MED ORDER — "THROMBI-PAD 3""X3"" EX PADS"
1.0000 | MEDICATED_PAD | Freq: Once | CUTANEOUS | Status: DC
  Filled 2011-09-09: qty 1

## 2011-09-09 MED ORDER — FAMOTIDINE IN NACL 20-0.9 MG/50ML-% IV SOLN
20.0000 mg | Freq: Two times a day (BID) | INTRAVENOUS | Status: DC
  Administered 2011-09-09 – 2011-09-10 (×3): 20 mg via INTRAVENOUS
  Filled 2011-09-09 (×5): qty 50

## 2011-09-09 MED ORDER — INSULIN REGULAR HUMAN 100 UNIT/ML IJ SOLN: 10.0000 [IU] | Freq: Once | INTRAMUSCULAR | Status: DC

## 2011-09-09 MED ORDER — DEXTROSE 50 % IV SOLN
25.0000 mL | Freq: Once | INTRAVENOUS | Status: AC
  Administered 2011-09-09: 25 mL via INTRAVENOUS
  Filled 2011-09-09: qty 50

## 2011-09-09 MED ORDER — HEPARIN SODIUM (PORCINE) 5000 UNIT/ML IJ SOLN
5000.0000 [IU] | Freq: Three times a day (TID) | INTRAMUSCULAR | Status: DC
  Administered 2011-09-09: 5000 [IU] via SUBCUTANEOUS
  Filled 2011-09-09 (×3): qty 1

## 2011-09-09 MED ORDER — VECURONIUM BROMIDE 10 MG IV SOLR
8.0000 mg | Freq: Once | INTRAVENOUS | Status: AC
  Administered 2011-09-09: 8 mg via INTRAVENOUS

## 2011-09-09 MED ORDER — INSULIN ASPART 100 UNIT/ML ~~LOC~~ SOLN: 10.0000 [IU] | Freq: Once | SUBCUTANEOUS | Status: DC

## 2011-09-09 NOTE — Progress Notes (Addendum)
No urine output since insertion of foley catheter. Foley catheter flushed with 20cc normal saline with no saline return. Bladder scanner resulted 217 cc in bladder. Foley removed and bloody catheter tip noted. Attempt was made to replace with 16 french foley with no success. Could not pass foley past prostate. Two Coude catheters attempted per trained RN who would could also not pass the prostate. MD notified and urology consult obtained.  All foley catheters removed at this time.

## 2011-09-09 NOTE — Procedures (Signed)
Arterial Catheter Insertion Procedure Note Gary Rivera [id number] 1960-12-30  Procedure: Insertion of Arterial Catheter  Indications: Frequent blood sampling  Procedure Details Consent: Risks of procedure as well as the alternatives and risks of each were explained to the (patient/caregiver).  Consent for procedure obtained. Time Out: Verified patient identification, verified procedure, site/side was marked, verified correct patient position, special equipment/implants available, medications/allergies/relevent history reviewed, required imaging and test results available.  Performed  Maximum sterile technique was used including antiseptics, cap, gloves, gown, hand hygiene, mask and sheet. Skin prep: Chlorhexidine; local anesthetic administered 20 gauge catheter was inserted into right radial artery using the Seldinger technique.  Evaluation Blood flow good; BP tracing good. Complications: No apparent complications.   Morley Kos 09/09/2011

## 2011-09-09 NOTE — Progress Notes (Signed)
Called by bedside RN to assist with intubation and transfer to ICU. Upon arrival to the floor, suction setup, PIV patent, Ambu bag setup, informed consent obtained from family member, CCM MD at bedside with RT. 20mg  IV etomidate given for intubation with glidoscope. Patient tolerated procedure well, transferred to 2108.

## 2011-09-09 NOTE — Progress Notes (Signed)
Pt transferred to 2108, report given at bedside. Adria Dill RN

## 2011-09-09 NOTE — Consult Note (Signed)
Patient: Gary Rivera DOB: 1960-08-17 Date of Admission: 09/07/2011            Pulmonary consult  Date of Consult: 09/09/2011 MD requesting consult:  Rizwan ([facility])  Reason for consult: respiratory failure   HPI -  51yo male with hx HTN, chronic renal failure on PD, CHF presented 6/19 to ER with c/o ~20 lbs weight gain and SOB.  He was admitted by hospitalist and seen by renal who rx with further PD and lasix.  On 6/20 he developed worsening resp distress requiring bipap and PCCM consulted - hypercarbic resp failure with BL infiltrates ? Aspiration vs edema    Filed Vitals:   09/09/11 1330 09/09/11 1400 09/09/11 1415 09/09/11 1430  BP:  120/70    Pulse: 64 60 58 67  Temp:      TempSrc:      Resp: 21 20 20 20   Height:      Weight:      SpO2: 99% 98% 100% 99%     CBC    Component Value Date/Time   WBC 6.0 09/09/2011 0345   RBC 2.86* 09/09/2011 0345   HGB 8.3* 09/09/2011 0345   HCT 28.5* 09/09/2011 0345   PLT 138* 09/09/2011 0345   MCV 99.7 09/09/2011 0345   MCH 29.0 09/09/2011 0345   MCHC 29.1* 09/09/2011 0345   RDW 17.2* 09/09/2011 0345   LYMPHSABS 1.0 09/08/2011 0300   MONOABS 0.3 09/08/2011 0300   EOSABS 0.3 09/08/2011 0300   BASOSABS 0.0 09/08/2011 0300     BMET    Component Value Date/Time   NA 143 09/09/2011 0345   K 6.5* 09/09/2011 0345   CL 102 09/09/2011 0345   CO2 28 09/09/2011 0345   GLUCOSE 105* 09/09/2011 0345   BUN 110* 09/09/2011 0345   CREATININE 18.20* 09/09/2011 0345   CALCIUM 7.3* 09/09/2011 0345   CALCIUM 8.0* 12/04/2009 0455   GFRNONAA 3* 09/09/2011 0345   GFRAA 3* 09/09/2011 0345     ABG    Component Value Date/Time   PHART 7.266* 09/09/2011 0946   PCO2ART 60.7* 09/09/2011 0946   PO2ART 123.0* 09/09/2011 0946   HCO3 27.6* 09/09/2011 0946   TCO2 29 09/09/2011 0946   ACIDBASEDEF 0.8 09/09/2011 0340   O2SAT 98.0 09/09/2011 0946      EXAM: General:  Obese male,intubated Neuro: drowsy, does not arouse or follow commands CV: RRR, distant heart sounds,  cannot assess JVD PULM: Dull breath sounds left base, otherwise clear, shallow respirations GI: abd obese, round, mildly distended, PD cath Extremities:  Warm and dry, no edema  Derm: no rash or skin breakdown   ASSESSMENT AND PLAN  PULMONARY  Lab 09/09/11 0946 09/09/11 0750 09/09/11 0340 09/09/11 0100 09/08/11 1630  PHART 7.266* 7.178* 7.204* 7.203* 7.256*  PCO2ART 60.7* 80.1* 68.9* 69.4* 59.7*  PO2ART 123.0* 46.0* 75.6* 66.0* 110.0*  HCO3 27.6* 29.5* 26.5* 26.6* 25.6*  O2SAT 98.0 66.0 94.2 91.0 100.0   Ventilator Settings: Vent Mode:  [-] PRVC FiO2 (%):  [69.4 %-100 %] 69.8 % Set Rate:  [16 bmp-20 bmp] 20 bmp Vt Set:  [500 mL-570 mL] 570 mL PEEP:  [10 cmH20] 10 cmH20 Plateau Pressure:  [25 cmH20-27 cmH20] 27 cmH20 CXR:  CM, edema pattern ETT:  6/21 >>  A:  Acute on chronic hypercarbic resp failure -- multifactorial r/t decompensated CHF c/b ESRD (inability to compensate metabolically) with likely underlying OSA/OHS, pulm HTN.  Initially he improved with NIMV but by the early AM of 6/21 he  was more hypoxemic with shallow rapid respirations.  Suspect combination of delirium from renal failure and respiratory muscle fatigue lead to respiratory failure overnight. Appears to have OSA, likely needs CPAP or even BIPAP  P:   -s/p intubation for respiratory failure an inability to protect airway -full vent support -abg s/o resp acidosis but suspect inability of renal compensation('lives' at high bicarb?) -cxr daily -sbt when appropriate -Volume removal - see below   CARDIOVASCULAR  Lab 09/09/11 1212 09/09/11 0345 09/08/11 0247 09/07/11 2000  TROPONINI <0.30 <0.30 <0.30 --  LATICACIDVEN -- -- -- --  PROBNP -- -- -- 24858.0*   ECG:  Nsr, no acute ischemia Lines: none  A: Acute on chronic systolic CHF - EF 40-45% (2011 echo) Moderate mitral regurgitation  Pulm HTN (pa sys 2011) P:  2D echo pending  Lasix does not seem to be effective cardiac enzymes  neg  RENAL  Lab 09/09/11 0345 09/08/11 0300 09/07/11 2000  NA 143 142 140  K 6.5* 4.6 --  CL 102 103 99  CO2 28 23 25   BUN 110* 122* 116*  CREATININE 18.20* 18.07* 18.04*  CALCIUM 7.3* 6.9* 7.2*  MG -- -- --  PHOS -- 6.4* --    Intake/Output Summary (Last 24 hours) at 09/09/11 1445 Last data filed at 09/09/11 1400  Gross per 24 hour  Intake 674.34 ml  Output    896 ml  Net -221.66 ml    Foley:  6/19  A:  ESRD - on PD with acute volume overload in setting systolic CHF P:   Renal managing - HD cath placed, CRRT to be initiated    GASTROINTESTINAL  Lab 09/08/11 1343 09/08/11 0300  AST 29 --  ALT 26 --  ALKPHOS 48 --  BILITOT 0.3 --  PROT 6.5 --  ALBUMIN 3.1* 2.9*    A:  No acute issues P:   monitor with PD   HEMATOLOGIC  Lab 09/09/11 0345 09/08/11 0300 09/07/11 2000  HGB 8.3* 7.7* 8.9*  HCT 28.5* 25.6* 29.2*  PLT 138* 123* 123*  INR -- -- --  APTT -- -- --   A:  Anemia Thrombocytopenia  P:  No s/s bleeding Likely r/t chronic disease Transfuse for hgb <7 Cont aranesp   INFECTIOUS  Lab 09/09/11 0345 09/08/11 0300 09/07/11 2000  WBC 6.0 5.5 4.0  PROCALCITON -- -- --   Cultures: BCx2 6/20 >>ng  Antibiotics: none  A:  No obvious infectious etology P:   Monitor fever, wbc curve    ENDOCRINE  A:  No acute issues  P:   Monitor glucose on bmet    NEUROLOGIC  A:  AMS/ lethargy - likely in setting hypercarbia and renal failure worsened by progressive hypoxemia P: Judicious fentanyl and versed use on vent WUA after vent support and improved hypercarbia; if not following commands then CT head  BEST PRACTICE / DISPOSITION Level of Care:  SDU Primary Service:  PCCM Consultants:  renal Code Status:  full Diet:  npo DVT Px: SCDs GI Px:  none Skin Integrity:  intact Social / Family:  Updated sister at bedside   Addn CC time 35  minutes.  Laurey Arrow PCCM [phone] If no response, call [phone]

## 2011-09-09 NOTE — Progress Notes (Signed)
eLink Physician-Brief Progress Note Patient Name: Gary Rivera DOB: 1960-10-14 MRN: [id number]  Date of Service  09/09/2011   HPI/Events of Note   Rn says patient still oozing from HD cath site  eICU Interventions  Dc sq heparin Continue scd   Intervention Category Intermediate Interventions: Other:;Best-practice therapies (e.g. DVT, beta blocker, etc.);Bleeding - evaluation and treatment with blood products  Kiesha Ensey 09/09/2011, 10:40 PM

## 2011-09-09 NOTE — Progress Notes (Signed)
eLink Physician-Brief Progress Note Patient Name: Gary Rivera DOB: [birth year] MRN: [id number]  Date of Service  09/09/2011   HPI/Events of Note    Lab 09/09/11 0345 09/08/11 0300 09/07/11 2000  K 6.5* 4.6 4.3    Lab 09/09/11 0345 09/08/11 0300 09/07/11 2000  CREATININE 18.20* 18.07* 18.04*      eICU Interventions  Kayexalate 30gm via tube   Intervention Category Intermediate Interventions: Electrolyte abnormality - evaluation and management  Bradlee Heitman 09/09/2011, 6:07 AM

## 2011-09-09 NOTE — Procedures (Signed)
Hemodialysis Insertion Procedure Note Gary Rivera [id number] 1960-04-15  Procedure: Insertion of Hemodialysis Catheter Type: 3 port  Indications: Hemodialysis   Procedure Details Consent: Risks of procedure as well as the alternatives and risks of each were explained to the (patient/caregiver).  Consent for procedure obtained. Time Out: Verified patient identification, verified procedure, site/side was marked, verified correct patient position, special equipment/implants available, medications/allergies/relevent history reviewed, required imaging and test results available.  Performed  Maximum sterile technique was used including antiseptics, cap, gloves, gown, hand hygiene, mask and sheet. Skin prep: Chlorhexidine; local anesthetic administered A antimicrobial bonded/coated triple lumen catheter was placed in the left internal jugular vein using the Seldinger technique. Ultrasound guidance used.yes Catheter placed to 20 cm. Blood aspirated via all 3 ports and then flushed x 3. Line sutured x 2 and dressing applied.  Evaluation Blood flow good Complications: No apparent complications Patient did tolerate procedure well. Chest X-ray ordered to verify placement.  CXR: pending.  [name] Minor ACNP Adolph Pollack PCCM Pager [phone] till 3 pm If no answer page [phone] 09/09/2011, 9:13 AM  Patient seen and examined, agree with above note.  I dictated the care and orders written for this patient under my direction.  Gary Rivera, M.D. [phone]

## 2011-09-09 NOTE — Procedures (Signed)
Intubation Procedure Note Gary Rivera [id number] 12/30/1960  Procedure: Intubation Indications: Respiratory insufficiency  Procedure Details Consent: Risks of procedure as well as the alternatives and risks of each were explained to the (patient/caregiver).  Consent for procedure obtained. Time Out: Verified patient identification, verified procedure, site/side was marked, verified correct patient position, special equipment/implants available, medications/allergies/relevent history reviewed, required imaging and test results available.  Performed  Drugs: Etomidate 20mg  IV DL x1 with Glidescope 4, Grade 2 view, 8-0 ETT passed through cords under video visualization; confirmation with smoke in tube, bilateral breath sounds, positive end tidal CO2 detector   Evaluation Hemodynamic Status: BP stable throughout; O2 sats: transiently fell during during procedure Patient's Current Condition: stable Complications: No apparent complications Patient did tolerate procedure well. Chest X-ray ordered to verify placement.  CXR: pending.   Max Fickle 09/09/2011

## 2011-09-09 NOTE — Consult Note (Signed)
Urology Consult   Physician requesting consult: Critical care Service  Reason for consult: Difficult Foley catheter placement  History of Present Illness: Gary Rivera is a 51 y.o. male who is intensive care unit for medical issues. He is on dialysis. He is also on Lasix. Catheter was found be draining properly this morning. Apparently, several attempts were made including a "coud catheter specialist". Urologic consultation is requested  He denies a history of voiding or storage urinary symptoms, hematuria, UTIs, STDs, urolithiasis, GU malignancy/trauma/surgery.  Past Medical History  Diagnosis Date  . Hypertension   . Renal disorder   . CHF (congestive heart failure)   . Peritoneal dialysis catheter in place   . Anemia     patient reporats that last hgb 7.9 a week ago    Past Surgical History  Procedure Date  . Appendectomy     Medications: Scheduled Meds:   . antiseptic oral rinse  15 mL Mouth Rinse QID  . aspirin  325 mg Oral Daily  . calcitRIOL  0.25 mcg Oral Custom  . calcium carbonate  1 tablet Oral TID WC  . calcium gluconate  1 g Intravenous Once  . chlorhexidine  15 mL Mouth Rinse BID  . darbepoetin (ARANESP) injection - DIALYSIS  150 mcg Subcutaneous Q Thu-HD  . dextrose  25 mL Intravenous Once  . etomidate  20 mg Intravenous Once  . famotidine (PEPCID) IV  20 mg Intravenous Q12H  . fentaNYL      . fentaNYL      . furosemide  120 mg Intravenous Once  . furosemide  160 mg Intravenous Q12H  . heparin subcutaneous  5,000 Units Subcutaneous Q8H  . insulin aspart  10 Units Intravenous Once  . midazolam      . midazolam  2-4 mg Intravenous Once  . multivitamin  1 tablet Oral Daily  . sevelamer  800 mg Oral TID WC  . sodium bicarbonate  50 mEq Intravenous Once  . sodium chloride  3 mL Intravenous Q12H  . sodium polystyrene  30 g Per Tube Once  . Thrombi-Pad  1 each Topical Once  . vecuronium      . vecuronium  8 mg Intravenous Once  . DISCONTD: insulin  aspart  10 Units Subcutaneous Once  . DISCONTD: insulin aspart  10 Units Intravenous Once  . DISCONTD: insulin regular  10 Units Subcutaneous Once   Continuous Infusions:   . calcium gluconate infusion for CRRT 20 g (09/09/11 1600)  . fentaNYL infusion INTRAVENOUS 300 mcg/hr (09/09/11 1800)  . dialysis replacement fluid (prismasate) 250 mL/hr at 09/09/11 1141  . dialysate (PRISMASATE) 1,500 mL/hr at 09/09/11 1819  . sodium citrate 2 %/dextrose 2.5% solution 3000 mL 330 mL/hr at 09/09/11 1800  . DISCONTD: dialysis solution 4.25% low-MG/low-CA 2,000 mL (09/08/11 0826)   PRN Meds:.sodium chloride, acetaminophen, clonazePAM, fentaNYL, heparin, midazolam, ondansetron (ZOFRAN) IV, sevelamer, sodium chloride, sodium chloride, sodium chloride  Allergies:  Allergies  Allergen Reactions  . Prednisone Nausea Only    Family History  Problem Relation Age of Onset  . Diabetes Mother   . Hypertension Mother   . Hypertension Sister     Social History:  reports that he has never smoked. He does not have any smokeless tobacco history on file. He reports that he does not drink alcohol or use illicit drugs.  ROS: A complete review of systems was performed.  All systems are negative except for pertinent findings as noted.  Physical Exam:  Vital signs in  last 24 hours: Temp:  [97.3 F (36.3 C)-99.4 F (37.4 C)] 99.2 F (37.3 C) (06/21 1600) Pulse Rate:  [58-104] 73  (06/21 1900) Resp:  [16-37] 20  (06/21 1900) BP: (81-170)/(44-117) 165/117 mmHg (06/21 1900) SpO2:  [87 %-100 %] 94 % (06/21 1900) FiO2 (%):  [59.5 %-100 %] 60 % (06/21 1900) Weight:  [154.2 kg (339 lb 15.2 oz)] 154.2 kg (339 lb 15.2 oz) (06/21 0016) Patient is intubated. He is.  His penis is uncircumcised. There is blood at his meatus. Scrotal exam is normal  Laboratory Data:   Basename 09/09/11 0345 09/08/11 0300 09/07/11 2000  WBC 6.0 5.5 4.0  HGB 8.3* 7.7* 8.9*  HCT 28.5* 25.6* 29.2*  PLT 138* 123* 123*      Basename 09/09/11 1550 09/09/11 0345 09/08/11 0300 09/07/11 2000  NA 142 143 142 140  K 4.8 6.5* 4.6 4.3  CL 99 102 103 99  GLUCOSE 86 105* 122* 113*  BUN 99* 110* 122* 116*  CALCIUM 8.4 7.3* 6.9* 7.2*  CREATININE 16.07* 18.20* 18.07* 18.04*     Results for orders placed during the hospital encounter of 09/07/11 (from the past 24 hour(s))  URINALYSIS, WITH MICROSCOPIC     Status: Abnormal   Collection Time   09/08/11 10:19 PM      Component Value Range   Color, Urine YELLOW  YELLOW   APPearance CLOUDY (*) CLEAR   Specific Gravity, Urine 1.017  1.005 - 1.030   pH 5.0  5.0 - 8.0   Glucose, UA NEGATIVE  NEGATIVE mg/dL   Hgb urine dipstick MODERATE (*) NEGATIVE   Bilirubin Urine NEGATIVE  NEGATIVE   Ketones, ur NEGATIVE  NEGATIVE mg/dL   Protein, ur >478 (*) NEGATIVE mg/dL   Urobilinogen, UA 0.2  0.0 - 1.0 mg/dL   Nitrite NEGATIVE  NEGATIVE   Leukocytes, UA TRACE (*) NEGATIVE   WBC, UA 0-2  <3 WBC/hpf   RBC / HPF 0-2  <3 RBC/hpf   Bacteria, UA RARE  RARE   Squamous Epithelial / LPF FEW (*) RARE   Urine-Other AMORPHOUS URATES/PHOSPHATES    BLOOD GAS, ARTERIAL     Status: Abnormal   Collection Time   09/09/11  1:00 AM      Component Value Range   FIO2 0.50     Delivery systems BILEVEL POSITIVE AIRWAY PRESSURE     Inspiratory PAP 20     Expiratory PAP 5     pH, Arterial 7.203 (*) 7.350 - 7.450   pCO2 arterial 69.4 (*) 35.0 - 45.0 mmHg   pO2, Arterial 66.0 (*) 80.0 - 100.0 mmHg   Bicarbonate 26.6 (*) 20.0 - 24.0 mEq/L   TCO2 28.8  0 - 100 mmol/L   Acid-base deficit 0.7  0.0 - 2.0 mmol/L   O2 Saturation 91.0     Patient temperature 97.3     Collection site RIGHT RADIAL     Drawn by [id number]     Sample type ARTERIAL DRAW     Allens test (pass/fail) PASS  PASS  BLOOD GAS, ARTERIAL     Status: Abnormal   Collection Time   09/09/11  3:40 AM      Component Value Range   FIO2 0.50     Delivery systems BILEVEL POSITIVE AIRWAY PRESSURE     Rate 8     Inspiratory PAP 20      Expiratory PAP 10     pH, Arterial 7.204 (*) 7.350 - 7.450   pCO2 arterial 68.9 (*)  35.0 - 45.0 mmHg   pO2, Arterial 75.6 (*) 80.0 - 100.0 mmHg   Bicarbonate 26.5 (*) 20.0 - 24.0 mEq/L   TCO2 28.7  0 - 100 mmol/L   Acid-base deficit 0.8  0.0 - 2.0 mmol/L   O2 Saturation 94.2     Patient temperature 97.3     Collection site LEFT RADIAL     Drawn by [phone]     Sample type ARTERIAL     Allens test (pass/fail) PASS  PASS  BASIC METABOLIC PANEL     Status: Abnormal   Collection Time   09/09/11  3:45 AM      Component Value Range   Sodium 143  135 - 145 mEq/L   Potassium 6.5 (*) 3.5 - 5.1 mEq/L   Chloride 102  96 - 112 mEq/L   CO2 28  19 - 32 mEq/L   Glucose, Bld 105 (*) 70 - 99 mg/dL   BUN 696 (*) 6 - 23 mg/dL   Creatinine, Ser 29.52 (*) 0.50 - 1.35 mg/dL   Calcium 7.3 (*) 8.4 - 10.5 mg/dL   GFR calc non Af Amer 3 (*) >90 mL/min   GFR calc Af Amer 3 (*) >90 mL/min  CBC     Status: Abnormal   Collection Time   09/09/11  3:45 AM      Component Value Range   WBC 6.0  4.0 - 10.5 K/uL   RBC 2.86 (*) 4.22 - 5.81 MIL/uL   Hemoglobin 8.3 (*) 13.0 - 17.0 g/dL   HCT 84.1 (*) 32.4 - 40.1 %   MCV 99.7  78.0 - 100.0 fL   MCH 29.0  26.0 - 34.0 pg   MCHC 29.1 (*) 30.0 - 36.0 g/dL   RDW 02.7 (*) 25.3 - 66.4 %   Platelets 138 (*) 150 - 400 K/uL  CARDIAC PANEL(CRET KIN+CKTOT+MB+TROPI)     Status: Abnormal   Collection Time   09/09/11  3:45 AM      Component Value Range   Total CK 1379 (*) 7 - 232 U/L   CK, MB 9.0 (*) 0.3 - 4.0 ng/mL   Troponin I <0.30  <0.30 ng/mL   Relative Index 0.7  0.0 - 2.5  GLUCOSE, CAPILLARY     Status: Abnormal   Collection Time   09/09/11  4:45 AM      Component Value Range   Glucose-Capillary 104 (*) 70 - 99 mg/dL   Comment 1 Documented in Chart     Comment 2 Notify RN    POCT I-STAT 3, BLOOD GAS (G3+)     Status: Abnormal   Collection Time   09/09/11  7:50 AM      Component Value Range   pH, Arterial 7.178 (*) 7.350 - 7.450   pCO2 arterial 80.1 (*) 35.0 -  45.0 mmHg   pO2, Arterial 46.0 (*) 80.0 - 100.0 mmHg   Bicarbonate 29.5 (*) 20.0 - 24.0 mEq/L   TCO2 32  0 - 100 mmol/L   O2 Saturation 66.0     Patient temperature 99.9 F     Collection site RADIAL, ALLEN'S TEST ACCEPTABLE     Sample type ARTERIAL     Comment NOTIFIED PHYSICIAN    POCT I-STAT 3, BLOOD GAS (G3+)     Status: Abnormal   Collection Time   09/09/11  9:46 AM      Component Value Range   pH, Arterial 7.266 (*) 7.350 - 7.450   pCO2 arterial 60.7 (*) 35.0 -  45.0 mmHg   pO2, Arterial 123.0 (*) 80.0 - 100.0 mmHg   Bicarbonate 27.6 (*) 20.0 - 24.0 mEq/L   TCO2 29  0 - 100 mmol/L   O2 Saturation 98.0     Patient temperature 98.6 F     Collection site ARTERIAL LINE     Drawn by RT     Sample type ARTERIAL     Comment NOTIFIED PHYSICIAN    CARDIAC PANEL(CRET KIN+CKTOT+MB+TROPI)     Status: Abnormal   Collection Time   09/09/11 12:12 PM      Component Value Range   Total CK 1168 (*) 7 - 232 U/L   CK, MB 7.7 (*) 0.3 - 4.0 ng/mL   Troponin I <0.30  <0.30 ng/mL   Relative Index 0.7  0.0 - 2.5  RENAL FUNCTION PANEL     Status: Abnormal   Collection Time   09/09/11  3:50 PM      Component Value Range   Sodium 142  135 - 145 mEq/L   Potassium 4.8  3.5 - 5.1 mEq/L   Chloride 99  96 - 112 mEq/L   CO2 26  19 - 32 mEq/L   Glucose, Bld 86  70 - 99 mg/dL   BUN 99 (*) 6 - 23 mg/dL   Creatinine, Ser 16.10 (*) 0.50 - 1.35 mg/dL   Calcium 8.4  8.4 - 96.0 mg/dL   Phosphorus 5.4 (*) 2.3 - 4.6 mg/dL   Albumin 3.0 (*) 3.5 - 5.2 g/dL   GFR calc non Af Amer 3 (*) >90 mL/min   GFR calc Af Amer 3 (*) >90 mL/min  CARDIAC PANEL(CRET KIN+CKTOT+MB+TROPI)     Status: Abnormal   Collection Time   09/09/11  4:00 PM      Component Value Range   Total CK 1186 (*) 7 - 232 U/L   CK, MB 8.5 (*) 0.3 - 4.0 ng/mL   Troponin I <0.30  <0.30 ng/mL   Relative Index 0.7  0.0 - 2.5   Recent Results (from the past 240 hour(s))  MRSA PCR SCREENING     Status: Normal   Collection Time   09/08/11  2:24 AM       Component Value Range Status Comment   MRSA by PCR NEGATIVE  NEGATIVE Final   CULTURE, BLOOD (ROUTINE X 2)     Status: Normal (Preliminary result)   Collection Time   09/08/11 10:40 AM      Component Value Range Status Comment   Specimen Description BLOOD RIGHT FOREARM   Final    Special Requests BOTTLES DRAWN AEROBIC AND ANAEROBIC 10CC EACH   Final    Culture  Setup Time [id number]   Final    Culture     Final    Value:        BLOOD CULTURE RECEIVED NO GROWTH TO DATE CULTURE WILL BE HELD FOR 5 DAYS BEFORE ISSUING A FINAL NEGATIVE REPORT   Report Status PENDING   Incomplete   CULTURE, BLOOD (ROUTINE X 2)     Status: Normal (Preliminary result)   Collection Time   09/08/11 10:50 AM      Component Value Range Status Comment   Specimen Description BLOOD RIGHT HAND   Final    Special Requests BOTTLES DRAWN AEROBIC ONLY 5CC   Final    Culture  Setup Time [id number]   Final    Culture     Final    Value:        BLOOD  CULTURE RECEIVED NO GROWTH TO DATE CULTURE WILL BE HELD FOR 5 DAYS BEFORE ISSUING A FINAL NEGATIVE REPORT   Report Status PENDING   Incomplete     Renal Function:  Basename 09/09/11 1550 09/09/11 0345 09/08/11 0300 09/07/11 2000  CREATININE 16.07* 18.20* 18.07* 18.04*   Estimated Creatinine Clearance: 8.5 ml/min (by C-G formula based on Cr of 16.07).  Radiologic Imaging: Dg Chest 2 View  09/07/2011  *RADIOLOGY REPORT*  Clinical Data: Shortness of breath.  Anxiety.  Congestive heart failure.  Hypertension.  CHEST - 2 VIEW  Comparison: 12/07/2009  Findings: Moderate cardiomegaly stable.  New diffuse mixed interstitial and airspace disease is seen bilaterally, consistent with diffuse pulmonary edema.  No evidence of pleural effusion.  IMPRESSION:  Acute pulmonary edema and stable cardiomegaly, consistent with congestive heart failure.  Original Report Authenticated By: Danae Orleans, M.D.   Dg Chest Port 1 View  09/09/2011  *RADIOLOGY REPORT*  Clinical Data: Insertion of  hemodialysis catheter.  Pulmonary edema.  PORTABLE CHEST - 1 VIEW  Comparison: 6/21, 6/20, and 09/07/2011  Findings: Double-lumen left internal jugular vein hemodialysis catheter has been inserted and the tips are in the superior vena cava.  Right jugular vein catheter tip is also in the superior vena cava.  The patient has bilateral pulmonary edema with pulmonary vascular congestion, slightly increased.  No pneumothorax.  Endotracheal tube is 2.7 cm above the carina.  IMPRESSION: Dialysis catheter tips in the superior vena cava.  No pneumothorax. Increased pulmonary edema.  Original Report Authenticated By: Gwynn Burly, M.D.   Dg Chest Port 1 View  09/09/2011  *RADIOLOGY REPORT*  Clinical Data: Endotracheal tube and central line placement.  PORTABLE CHEST - 1 VIEW  Comparison: Chest radiograph performed 09/08/2011  Findings: The patient's endotracheal tube is seen ending nearly 2 cm above the carina.  This could be retracted approximately 1 cm. A right IJ line is noted ending within the right atrium; this should be retracted 2-3 cm.  The lungs are hypoexpanded.  Vascular congestion is noted, with dense bilateral central opacities and retrocardiac opacity.  This may reflect pulmonary edema or multifocal pneumonia.  A small left pleural effusion is suspected; no pneumothorax is seen.  The cardiomediastinal silhouette is borderline enlarged.  No acute osseous abnormalities are identified.  IMPRESSION:  1.  Endotracheal tube seen ending nearly 2 cm above the carina; this could be retracted approximately 1 cm. 2.  Right IJ line noted ending within the right atrium; this should be retracted 2-3 cm. 3.  Lungs hypoexpanded; vascular congestion, with worsening dense bilateral central opacities and retrocardiac opacity.  This may reflect pulmonary edema or multifocal pneumonia.  Suspect small left pleural effusion. 4.  Borderline cardiomegaly.  These results were called by telephone on 09/09/2011  at  06:04 a.m. to   [facility] on [id number], who verbally acknowledged these results.  Original Report Authenticated By: Tonia Ghent, M.D.   Dg Chest Port 1 View  09/08/2011  *RADIOLOGY REPORT*  Clinical Data: Congestive heart failure.  Severe shortness of breath.  Pulmonary edema.  PORTABLE CHEST - 1 VIEW  Comparison: 09/07/2011 and 12/07/2009  Findings: Bilateral pulmonary edema persists with slight clearing at the right lung base.  No effusions.  Chronic cardiomegaly.  No osseous abnormality.  IMPRESSION: Slight improvement in pulmonary edema at the right base.  Original Report Authenticated By: Gwynn Burly, M.D.    I independently reviewed the above imaging studies.  Impression/Assessment:  Difficult catheter placement  Plan:  After sterile prep and drape, and using 30 cc of is lidocaine, an 38 [name] coud-tip catheter was placed. The balloon was filled with 10 cc of water. Clear urine was obtained and the catheter was 2 drainage bag.  Reconsult prn  Chelsea Aus 09/09/2011, 7:19 PM    Bertram Millard. Theone Bowell MD

## 2011-09-09 NOTE — Progress Notes (Signed)
eLink Physician-Brief Progress Note Patient Name: Gary Rivera DOB: 03-Apr-1960 MRN: [id number]  Date of Service  09/09/2011   HPI/Events of Note   Dg Chest 2 View  09/07/2011  *RADIOLOGY REPORT*  Clinical Data: Shortness of breath.  Anxiety.  Congestive heart failure.  Hypertension.  CHEST - 2 VIEW  Comparison: 12/07/2009  Findings: Moderate cardiomegaly stable.  New diffuse mixed interstitial and airspace disease is seen bilaterally, consistent with diffuse pulmonary edema.  No evidence of pleural effusion.  IMPRESSION:  Acute pulmonary edema and stable cardiomegaly, consistent with congestive heart failure.  Original Report Authenticated By: Danae Orleans, M.D.   Dg Chest Port 1 View  09/09/2011  *RADIOLOGY REPORT*  Clinical Data: Endotracheal tube and central line placement.  PORTABLE CHEST - 1 VIEW  Comparison: Chest radiograph performed 09/08/2011  Findings: The patient's endotracheal tube is seen ending nearly 2 cm above the carina.  This could be retracted approximately 1 cm. A right IJ line is noted ending within the right atrium; this should be retracted 2-3 cm.  The lungs are hypoexpanded.  Vascular congestion is noted, with dense bilateral central opacities and retrocardiac opacity.  This may reflect pulmonary edema or multifocal pneumonia.  A small left pleural effusion is suspected; no pneumothorax is seen.  The cardiomediastinal silhouette is borderline enlarged.  No acute osseous abnormalities are identified.  IMPRESSION:  1.  Endotracheal tube seen ending nearly 2 cm above the carina; this could be retracted approximately 1 cm. 2.  Right IJ line noted ending within the right atrium; this should be retracted 2-3 cm. 3.  Lungs hypoexpanded; vascular congestion, with worsening dense bilateral central opacities and retrocardiac opacity.  This may reflect pulmonary edema or multifocal pneumonia.  Suspect small left pleural effusion. 4.  Borderline cardiomegaly.  These results were called by  telephone on 09/09/2011  at  06:04 a.m. to  [facility] on [id number], who verbally acknowledged these results.  Original Report Authenticated By: Tonia Ghent, M.D.   Dg Chest Port 1 View  09/08/2011  *RADIOLOGY REPORT*  Clinical Data: Congestive heart failure.  Severe shortness of breath.  Pulmonary edema.  PORTABLE CHEST - 1 VIEW  Comparison: 09/07/2011 and 12/07/2009  Findings: Bilateral pulmonary edema persists with slight clearing at the right lung base.  No effusions.  Chronic cardiomegaly.  No osseous abnormality.  IMPRESSION: Slight improvement in pulmonary edema at the right base.  Original Report Authenticated By: Gwynn Burly, M.D.     eICU Interventions  Dr Johny Chess addressing CVL Will hold et tube as is   Intervention Category Intermediate Interventions: Diagnostic test evaluation  [name] 09/09/2011, 6:44 AM

## 2011-09-09 NOTE — Consult Note (Signed)
Patient: Gary Rivera DOB: Apr 20, 1960 Date of Admission: 09/07/2011            Pulmonary consult  Date of Consult: 09/09/2011 MD requesting consult:  Rizwan ([facility])  Reason for consult: respiratory failure   HPI -  51yo male with hx HTN, chronic renal failure on PD, CHF presented 6/19 to ER with c/o ~20 lbs weight gain and SOB.  He was admitted by hospitalist and seen by renal who rx with further PD and lasix.  On 6/20 he developed worsening resp distress requiring bipap and PCCM consulted.    Filed Vitals:   09/09/11 0016 09/09/11 0019 09/09/11 0200 09/09/11 0310  BP: 170/89 163/93 107/74   Pulse: 94  85 76  Temp: 97.3 F (36.3 C)     TempSrc: Axillary     Resp: 37  29 25  Height:      Weight: 154.2 kg (339 lb 15.2 oz)     SpO2: 93%  97% 92%     CBC    Component Value Date/Time   WBC 6.0 09/09/2011 0345   RBC 2.86* 09/09/2011 0345   HGB 8.3* 09/09/2011 0345   HCT 28.5* 09/09/2011 0345   PLT 138* 09/09/2011 0345   MCV 99.7 09/09/2011 0345   MCH 29.0 09/09/2011 0345   MCHC 29.1* 09/09/2011 0345   RDW 17.2* 09/09/2011 0345   LYMPHSABS 1.0 09/08/2011 0300   MONOABS 0.3 09/08/2011 0300   EOSABS 0.3 09/08/2011 0300   BASOSABS 0.0 09/08/2011 0300     BMET    Component Value Date/Time   NA 142 09/08/2011 0300   K 4.6 09/08/2011 0300   CL 103 09/08/2011 0300   CO2 23 09/08/2011 0300   GLUCOSE 122* 09/08/2011 0300   BUN 122* 09/08/2011 0300   CREATININE 18.07* 09/08/2011 0300   CALCIUM 6.9* 09/08/2011 0300   CALCIUM 8.0* 12/04/2009 0455   GFRNONAA 3* 09/08/2011 0300   GFRAA 3* 09/08/2011 0300     ABG    Component Value Date/Time   PHART 7.204* 09/09/2011 0340   PCO2ART 68.9* 09/09/2011 0340   PO2ART 75.6* 09/09/2011 0340   HCO3 26.5* 09/09/2011 0340   TCO2 28.7 09/09/2011 0340   ACIDBASEDEF 0.8 09/09/2011 0340   O2SAT 94.2 09/09/2011 0340      EXAM: General:  Obese male, stuporous on BIPAP Neuro: drowsy, does not arouse or follow commands CV: RRR, distant heart sounds, cannot  assess JVD PULM: Dull breath sounds left base, otherwise clear, shallow respirations GI: abd obese, round, mildly distended, PD cath Extremities:  Warm and dry, no edema  Derm: no rash or skin breakdown   ASSESSMENT AND PLAN  PULMONARY  Lab 09/09/11 0340 09/09/11 0100 09/08/11 1630 09/08/11 1415  PHART 7.204* 7.203* 7.256* 7.232*  PCO2ART 68.9* 69.4* 59.7* 61.8*  PO2ART 75.6* 66.0* 110.0* 72.6*  HCO3 26.5* 26.6* 25.6* 25.1*  O2SAT 94.2 91.0 100.0 91.4   Ventilator Settings:   CXR:  CM, edema pattern ETT:  6/21 >>  A:  Acute on chronic hypercarbic resp failure -- multifactorial r/t decompensated CHF c/b ESRD (inability to compensate metabolically) with likely underlying OSA/OHS, pulm HTN.  Initially he improved with NIMV but by the early AM of 6/21 he was more hypoxemic with shallow rapid respirations.  Suspect combination of delirium from renal failure and respiratory muscle fatigue lead to respiratory failure overnight. Appears to have OSA, likely needs CPAP or even BIPAP  P:   -s/p intubation for respiratory failure an inability to protect airway -  full vent support -abg in one hour -cxr daily -sbt when appropriate -Volume removal - see below   CARDIOVASCULAR  Lab 09/08/11 0247 09/07/11 2000  TROPONINI <0.30 --  LATICACIDVEN -- --  PROBNP -- 16109.6*   ECG:  Nsr, no acute ischemia Lines: none  A: Acute on chronic systolic CHF - EF 40-45% (2011 echo) Moderate mitral regurgitation  Pulm HTN (pa sys 2011) P:  2D echo pending  Lasix does not seem to be effective Continue PD with volume removal per renal Check EKG and cardiac enzymes now to ensure no new ischaemia related to this morning's decline   RENAL  Lab 09/08/11 0300 09/07/11 2000  NA 142 140  K 4.6 4.3  CL 103 99  CO2 23 25  BUN 122* 116*  CREATININE 18.07* 18.04*  CALCIUM 6.9* 7.2*  MG -- --  PHOS 6.4* --    Intake/Output Summary (Last 24 hours) at 09/09/11 0444 Last data filed at  09/08/11 2149  Gross per 24 hour  Intake    492 ml  Output    200 ml  Net    292 ml    Foley:  6/19  A:  ESRD - on PD with acute volume overload in setting systolic CHF P:   Renal managing     GASTROINTESTINAL  Lab 09/08/11 1343 09/08/11 0300  AST 29 --  ALT 26 --  ALKPHOS 48 --  BILITOT 0.3 --  PROT 6.5 --  ALBUMIN 3.1* 2.9*    A:  No acute issues P:   monitor with PD   HEMATOLOGIC  Lab 09/09/11 0345 09/08/11 0300 09/07/11 2000  HGB 8.3* 7.7* 8.9*  HCT 28.5* 25.6* 29.2*  PLT 138* 123* 123*  INR -- -- --  APTT -- -- --   A:  Anemia Thrombocytopenia  P:  No s/s bleeding Likely r/t chronic disease Transfuse for hgb <7 Cont aranesp   INFECTIOUS  Lab 09/09/11 0345 09/08/11 0300 09/07/11 2000  WBC 6.0 5.5 4.0  PROCALCITON -- -- --   Cultures: BCx2 6/20 >>  Antibiotics: none  A:  No obvious infectious etology P:   Monitor fever, wbc curve    ENDOCRINE  A:  No acute issues  P:   Monitor glucose on bmet    NEUROLOGIC  A:  AMS/ lethargy - likely in setting hypercarbia and renal failure worsened by progressive hypoxemia P: Full vent support Judicious fentanyl and versed use on vent WUA after vent support and improved hypercarbia; if not following commands then CT head  BEST PRACTICE / DISPOSITION Level of Care:  SDU Primary Service:  PCCM Consultants:  renal Code Status:  full Diet:  npo DVT Px: SCDs GI Px:  none Skin Integrity:  intact Social / Family:  Updated sister at bedside   CC time 45 minutes.  Yolonda Kida PCCM Pager: [phone] Cell: [phone] If no response, call [phone]

## 2011-09-09 NOTE — Progress Notes (Signed)
Subjective:  Events noted. Developed worsening hypercarbic resp failure and eventually intubation.  PD did seem to be ultrafiltering over 3 liters.  Labs look like he has not received any dialysis at all.  K is up this AM as well.   Objective Vital signs in last 24 hours: Filed Vitals:   09/09/11 0500 09/09/11 0528 09/09/11 0700 09/09/11 0727  BP: 147/87 147/87 91/49 91/49   Pulse: 89 80 70 65  Temp:      TempSrc:      Resp: 21 16 16 16   Height:      Weight:      SpO2: 98% 98% 93% 95%   Weight change: 2.9 kg (6 lb 6.3 oz)  Intake/Output Summary (Last 24 hours) at 09/09/11 0806 Last data filed at 09/09/11 0800  Gross per 24 hour  Intake    562 ml  Output    200 ml  Net    362 ml   Labs: Basic Metabolic Panel:  Lab 09/09/11 4098 09/08/11 0300 09/07/11 2000  NA 143 142 140  K 6.5* 4.6 4.3  CL 102 103 99  CO2 28 23 25   GLUCOSE 105* 122* 113*  BUN 110* 122* 116*  CREATININE 18.20* 18.07* 18.04*  CALCIUM 7.3* 6.9* 7.2*  ALB -- -- --  PHOS -- 6.4* --   Liver Function Tests:  Lab 09/08/11 1343 09/08/11 0300  AST 29 --  ALT 26 --  ALKPHOS 48 --  BILITOT 0.3 --  PROT 6.5 --  ALBUMIN 3.1* 2.9*   No results found for this basename: LIPASE:3,AMYLASE:3 in the last 168 hours No results found for this basename: AMMONIA:3 in the last 168 hours CBC:  Lab 09/09/11 0345 09/08/11 0300 09/07/11 2000  WBC 6.0 5.5 4.0  NEUTROABS -- 4.0 --  HGB 8.3* 7.7* 8.9*  HCT 28.5* 25.6* 29.2*  MCV 99.7 95.5 95.7  PLT 138* 123* 123*   Cardiac Enzymes:  Lab 09/09/11 0345 09/08/11 1050 09/08/11 0247  CKTOTAL 1379* 1517* 1411*  CKMB 9.0* -- 8.7*  CKMBINDEX -- -- --  TROPONINI <0.30 -- <0.30   CBG:  Lab 09/09/11 0445  GLUCAP 104*    Iron Studies:   Basename 09/08/11 0300  IRON 46  TIBC 275  TRANSFERRIN --  FERRITIN 482*   Studies/Results: Dg Chest 2 View  09/07/2011  *RADIOLOGY REPORT*  Clinical Data: Shortness of breath.  Anxiety.  Congestive heart failure.  Hypertension.   CHEST - 2 VIEW  Comparison: 12/07/2009  Findings: Moderate cardiomegaly stable.  New diffuse mixed interstitial and airspace disease is seen bilaterally, consistent with diffuse pulmonary edema.  No evidence of pleural effusion.  IMPRESSION:  Acute pulmonary edema and stable cardiomegaly, consistent with congestive heart failure.  Original Report Authenticated By: Danae Orleans, M.D.   Dg Chest Port 1 View  09/09/2011  *RADIOLOGY REPORT*  Clinical Data: Endotracheal tube and central line placement.  PORTABLE CHEST - 1 VIEW  Comparison: Chest radiograph performed 09/08/2011  Findings: The patient's endotracheal tube is seen ending nearly 2 cm above the carina.  This could be retracted approximately 1 cm. A right IJ line is noted ending within the right atrium; this should be retracted 2-3 cm.  The lungs are hypoexpanded.  Vascular congestion is noted, with dense bilateral central opacities and retrocardiac opacity.  This may reflect pulmonary edema or multifocal pneumonia.  A small left pleural effusion is suspected; no pneumothorax is seen.  The cardiomediastinal silhouette is borderline enlarged.  No acute osseous abnormalities are  identified.  IMPRESSION:  1.  Endotracheal tube seen ending nearly 2 cm above the carina; this could be retracted approximately 1 cm. 2.  Right IJ line noted ending within the right atrium; this should be retracted 2-3 cm. 3.  Lungs hypoexpanded; vascular congestion, with worsening dense bilateral central opacities and retrocardiac opacity.  This may reflect pulmonary edema or multifocal pneumonia.  Suspect small left pleural effusion. 4.  Borderline cardiomegaly.  These results were called by telephone on 09/09/2011  at  06:04 a.m. to  [facility] on [id number], who verbally acknowledged these results.  Original Report Authenticated By: Tonia Ghent, M.D.   Dg Chest Port 1 View  09/08/2011  *RADIOLOGY REPORT*  Clinical Data: Congestive heart failure.  Severe shortness of breath.   Pulmonary edema.  PORTABLE CHEST - 1 VIEW  Comparison: 09/07/2011 and 12/07/2009  Findings: Bilateral pulmonary edema persists with slight clearing at the right lung base.  No effusions.  Chronic cardiomegaly.  No osseous abnormality.  IMPRESSION: Slight improvement in pulmonary edema at the right base.  Original Report Authenticated By: Gwynn Burly, M.D.   Medications: Infusions:    . dialysis solution 4.25% low-MG/low-CA 2,000 mL (09/08/11 0826)  . fentaNYL infusion INTRAVENOUS 100 mcg/hr (09/09/11 0630)    Scheduled Medications:    . aspirin  325 mg Oral Daily  . calcitRIOL  0.25 mcg Oral Custom  . calcium carbonate  1 tablet Oral TID WC  . darbepoetin (ARANESP) injection - DIALYSIS  150 mcg Subcutaneous Q Thu-HD  . etomidate  20 mg Intravenous Once  . famotidine (PEPCID) IV  20 mg Intravenous Q12H  . fentaNYL      . fentaNYL      . furosemide  120 mg Intravenous Once  . furosemide  160 mg Intravenous Q12H  . midazolam      . midazolam  2-4 mg Intravenous Once  . multivitamin  1 tablet Oral Daily  . sevelamer  800 mg Oral TID WC  . sodium chloride  3 mL Intravenous Q12H  . sodium polystyrene  30 g Per Tube Once  . Thrombi-Pad  1 each Topical Once  . DISCONTD: calcitRIOL  0.25 mcg Oral See admin instructions  . DISCONTD: labetalol  300 mg Oral BID  . DISCONTD: NIFEdipine  60 mg Oral Daily  . DISCONTD: sevelamer  800 mg Oral See admin instructions    have reviewed scheduled and prn medications.  Physical Exam: General: intubated Heart: RRR Lungs: decreased BS at bases Abdomen: obese, positive BS Extremities:  Still pitting edema Dialysis Access: PD cath, looks clean and dry   I Assessment/ Plan: Pt is a 51 y.o. yo male who was admitted on 09/07/2011 with  ESRD/PD- volume overload and uremia  Assessment/Plan: 1. CHF/volume overload- Did ultrafilter overnight over 3 liters.  In spite of that devloped worsening respiratory failure req intubation. Looks like element  of hypercarbic resp failure maybe from OSA pathology.  Also has restrictive issue with abdomen full of fluid. Also on 100% O2.  Will plan on changing to CRRT in order to be more efficient with volume removal.    2. ESRD - PD normally.  Says that he loves it and is not considering change to HD. Right now is too unstable for aggressive PD and is uremic.  Will initiate CRRT today in order to get more efficient cleaning and improve volume removal.  Drain abdomen and hopefully once patient is more stable can re initiate PD. Is having oozing from his  central line, platelets have dropped, will use citrate protocol with CRRT  3. Anemia- continue aranesp.  Iron stores slightly low, would consider IV iron when more stable but not now.  Consider transfusion but only if hgb gets below 7-7.5 4. Secondary hyperparathyroidism- continue calcitriol and renvela and tums, renavite.  Oral meds on hold right now due to intubation 5. HTN/volume- CRRT for volume removal as able 6. Respiratory failure and now with hypotension.  Just resp failure due to above or something else going on, work up in progress per CCM 7. Hyperkalemia- giving calcium, bicarb, insulin and D50 as well as kaexylate.  CRRT will be started in the next 4 hours or so.   spole to patients sister who had concerns about him because she knew he did not want HD.  I told her my plan of hopeful eventual return to PD therapy  Audine Mangione A   09/09/2011,8:06 AM  LOS: 2 days

## 2011-09-09 NOTE — Progress Notes (Signed)
  Lab 09/09/11 0100 09/08/11 1630 09/08/11 1415  PHART 7.203* 7.256* 7.232*  PCO2ART 69.4* 59.7* 61.8*  PO2ART 66.0* 110.0* 72.6*  HCO3 26.6* 25.6* 25.1*  TCO2 28.8 27.5 27.0  O2SAT 91.0 100.0 91.4    On camera exam    abg worse despite increase in insp pressure   RASS -1, and fully oriented per RN Mildly paradoxical respiration  PLAN  - increase epap on bipap (d/w rt)  - recheck abg in 1 h; if worse move to ICU

## 2011-09-09 NOTE — Procedures (Signed)
Central Venous Catheter Insertion Procedure Note Gary Rivera [id number] 1960/09/27  Procedure: Insertion of Central Venous Catheter Indications: Drug and/or fluid administration  Procedure Details Consent: Risks of procedure as well as the alternatives and risks of each were explained to the (patient/caregiver).  Consent for procedure obtained. Time Out: Verified patient identification, verified procedure, site/side was marked, verified correct patient position, special equipment/implants available, medications/allergies/relevent history reviewed, required imaging and test results available.  Performed  Maximum sterile technique was used including antiseptics, cap, gloves, gown, hand hygiene, mask and sheet. Skin prep: Chlorhexidine; local anesthetic administered A antimicrobial bonded/coated triple lumen catheter was placed in the right internal jugular vein using the Seldinger technique. Ultrasound used for vessel identification.  Evaluation Blood flow good Complications: No apparent complications Patient did tolerate procedure well. Chest X-ray ordered to verify placement.  CXR: pending.  Gary Rivera 09/09/2011, 5:27 AM

## 2011-09-09 NOTE — Progress Notes (Signed)
CRITICAL VALUE ALERT  Critical value received:  Potassium 6.5   Date of notification:  09/09/2011  Time of notification: 0530  Critical value read back:yes  Nurse who received alert:  Corky Mull  MD notified (1st page):  Dr Colletta Maryland   Time of first page: 0530  MD notified (2nd page):  Time of second page:  Responding MD:  Dr. Colletta Maryland  Time MD responded:   [phone]

## 2011-09-09 NOTE — Progress Notes (Signed)
INITIAL ADULT NUTRITION ASSESSMENT Date: 09/09/2011   Time: 12:52 PM  Reason for Assessment: VDRF  ASSESSMENT: Male 51 y.o.  Dx: Acute respiratory failure with hypoxia  Hx:  Past Medical History  Diagnosis Date  . Hypertension   . Renal disorder   . CHF (congestive heart failure)   . Peritoneal dialysis catheter in place   . Anemia     patient reporats that last hgb 7.9 a week ago    Related Meds:  Scheduled Meds:   . aspirin  325 mg Oral Daily  . calcitRIOL  0.25 mcg Oral Custom  . calcium carbonate  1 tablet Oral TID WC  . calcium gluconate  1 g Intravenous Once  . darbepoetin (ARANESP) injection - DIALYSIS  150 mcg Subcutaneous Q Thu-HD  . dextrose  25 mL Intravenous Once  . etomidate  20 mg Intravenous Once  . famotidine (PEPCID) IV  20 mg Intravenous Q12H  . fentaNYL      . fentaNYL      . furosemide  120 mg Intravenous Once  . furosemide  160 mg Intravenous Q12H  . heparin subcutaneous  5,000 Units Subcutaneous Q8H  . insulin aspart  10 Units Intravenous Once  . midazolam      . midazolam  2-4 mg Intravenous Once  . multivitamin  1 tablet Oral Daily  . sevelamer  800 mg Oral TID WC  . sodium bicarbonate  50 mEq Intravenous Once  . sodium chloride  3 mL Intravenous Q12H  . sodium polystyrene  30 g Per Tube Once  . Thrombi-Pad  1 each Topical Once  . vecuronium      . vecuronium  8 mg Intravenous Once  . DISCONTD: calcitRIOL  0.25 mcg Oral See admin instructions  . DISCONTD: insulin aspart  10 Units Subcutaneous Once  . DISCONTD: insulin aspart  10 Units Intravenous Once  . DISCONTD: insulin regular  10 Units Subcutaneous Once  . DISCONTD: sevelamer  800 mg Oral See admin instructions   Continuous Infusions:   . calcium gluconate infusion for CRRT 70 mL/hr at 09/09/11 1248  . fentaNYL infusion INTRAVENOUS 200 mcg/hr (09/09/11 1130)  . dialysis replacement fluid (prismasate) 250 mL/hr at 09/09/11 1141  . dialysate (PRISMASATE) 1,500 mL/hr at 09/09/11 1141   . sodium citrate 2 %/dextrose 2.5% solution 3000 mL 270 mL/hr at 09/09/11 1250  . DISCONTD: dialysis solution 4.25% low-MG/low-CA 2,000 mL (09/08/11 0826)   PRN Meds:.sodium chloride, acetaminophen, clonazePAM, fentaNYL, heparin, midazolam, ondansetron (ZOFRAN) IV, sevelamer, sodium chloride, sodium chloride, sodium chloride, DISCONTD: clonazePAM, DISCONTD: HYDROcodone-acetaminophen   Ht: 6\' 2"  (188 cm)  Wt: 339 lb 15.2 oz (154.2 kg)  Ideal Wt: 86.4 kg % Ideal Wt: 178%  Usual Wt: unknown  Body mass index is 43.65 kg/(m^2). class 3, extreme obesity  Food/Nutrition Related Hx: unknown; no nutrition problems identified on admission  Labs:  CMP     Component Value Date/Time   NA 143 09/09/2011 0345   K 6.5* 09/09/2011 0345   CL 102 09/09/2011 0345   CO2 28 09/09/2011 0345   GLUCOSE 105* 09/09/2011 0345   BUN 110* 09/09/2011 0345   CREATININE 18.20* 09/09/2011 0345   CALCIUM 7.3* 09/09/2011 0345   CALCIUM 8.0* 12/04/2009 0455   PROT 6.5 09/08/2011 1343   ALBUMIN 3.1* 09/08/2011 1343   AST 29 09/08/2011 1343   ALT 26 09/08/2011 1343   ALKPHOS 48 09/08/2011 1343   BILITOT 0.3 09/08/2011 1343   GFRNONAA 3* 09/09/2011 0345   GFRAA  3* 09/09/2011 0345    CBG (last 3)   Basename 09/09/11 0445  GLUCAP 104*    Potassium  Date/Time Value Range Status  09/09/2011  3:45 AM 6.5* 3.5 - 5.1 mEq/L Final     CRITICAL RESULT CALLED TO, READ BACK BY AND VERIFIED WITH:     WHITE,S RN 09/09/11 0541 ACAINJ     NO VISIBLE HEMOLYSIS     REPEATED TO VERIFY    Phosphorus  Date/Time Value Range Status  09/08/2011  3:00 AM 6.4* 2.3 - 4.6 mg/dL Final  06/27/8117  1:47 AM 4.7* 2.3 - 4.6 mg/dL Final  11/17/5619  3:08 AM 5.1* 2.3 - 4.6 mg/dL Final     Intake/Output Summary (Last 24 hours) at 09/09/11 1257 Last data filed at 09/09/11 1200  Gross per 24 hour  Intake  533.5 ml  Output    234 ml  Net  299.5 ml     Diet Order:  NPO  Supplements/Tube Feeding:  None  IVF:    calcium gluconate  infusion for CRRT Last Rate: 70 mL/hr at 09/09/11 1248  fentaNYL infusion INTRAVENOUS Last Rate: 200 mcg/hr (09/09/11 1130)  dialysis replacement fluid (prismasate) Last Rate: 250 mL/hr at 09/09/11 1141  dialysate (PRISMASATE) Last Rate: 1,500 mL/hr at 09/09/11 1141  sodium citrate 2 %/dextrose 2.5% solution 3000 mL Last Rate: 270 mL/hr at 09/09/11 1250  DISCONTD: dialysis solution 4.25% low-MG/low-CA Last Rate: 2,000 mL (09/08/11 0826)    Estimated Nutritional Needs:   Kcal: 2600 Protein: 155-165 grams Fluid: 2.6 liters  Potassium and phosphorous are elevated.  Patient is fluid overloaded.  Patient usually receives peritoneal dialysis, but unable to remove enough fluid since admission, so CVVHD was initiated this morning.  Expect current weight is elevated with fluid overload.  Per discussion in board rounds, may be able to start TF tomorrow.  OG tube is in place.  NUTRITION DIAGNOSIS: -Inadequate oral intake (NI-2.1).  Status: Ongoing  RELATED TO: inability to eat   AS EVIDENCED BY: NPO status  MONITORING/EVALUATION(Goals):  Goal:  Enteral nutrition to provide 60-70% of estimated calorie needs (22-25 kcals/kg ideal body weight) and 100% of estimated protein needs, based on ASPEN guidelines for permissive underfeeding in critically ill obese individuals.  Monitor:  TF initiation/tolerance, weight trend, renal function, labs.  EDUCATION NEEDS: -Education not appropriate at this time  INTERVENTION: When able to start TF, recommend Osmolite 1.5 at 20 ml/h, increase by 10 ml every 4 hours to goal rate of 30 with Prostat 60 ml QID to provide 1880 kcals, 165 grams protein, 549 ml free water daily.  Dietitian #:  [phone]  DOCUMENTATION CODES Per approved criteria  -Morbid Obesity    Hettie Holstein 09/09/2011, 12:52 PM

## 2011-09-10 ENCOUNTER — Inpatient Hospital Stay (HOSPITAL_COMMUNITY): Payer: Medicare Other

## 2011-09-10 LAB — POCT I-STAT EG7
Acid-Base Excess: 1 mmol/L (ref 0.0–2.0)
Acid-Base Excess: 1 mmol/L (ref 0.0–2.0)
Acid-Base Excess: 5 mmol/L — ABNORMAL HIGH (ref 0.0–2.0)
Acid-Base Excess: 7 mmol/L — ABNORMAL HIGH (ref 0.0–2.0)
Acid-Base Excess: 12 mmol/L — ABNORMAL HIGH (ref 0.0–2.0)
Acid-Base Excess: 9 mmol/L — ABNORMAL HIGH (ref 0.0–2.0)
Acid-Base Excess: 12 mmol/L — ABNORMAL HIGH (ref 0.0–2.0)
Acid-Base Excess: 14 mmol/L — ABNORMAL HIGH (ref 0.0–2.0)
Bicarbonate: 28.4 mEq/L — ABNORMAL HIGH (ref 20.0–24.0)
Bicarbonate: 28.4 mEq/L — ABNORMAL HIGH (ref 20.0–24.0)
Bicarbonate: 30.5 mEq/L — ABNORMAL HIGH (ref 20.0–24.0)
Bicarbonate: 32.5 mEq/L — ABNORMAL HIGH (ref 20.0–24.0)
Bicarbonate: 34.7 mEq/L — ABNORMAL HIGH (ref 20.0–24.0)
Bicarbonate: 38.1 mEq/L — ABNORMAL HIGH (ref 20.0–24.0)
Bicarbonate: 38.5 mEq/L — ABNORMAL HIGH (ref 20.0–24.0)
Calcium, Ion: 0.63 mmol/L — CL (ref 1.12–1.32)
Calcium, Ion: 0.64 mmol/L — CL (ref 1.12–1.32)
Calcium, Ion: 0.64 mmol/L — CL (ref 1.12–1.32)
Calcium, Ion: 0.61 mmol/L — CL (ref 1.12–1.32)
Calcium, Ion: 0.77 mmol/L — ABNORMAL LOW (ref 1.12–1.32)
Calcium, Ion: 0.75 mmol/L — ABNORMAL LOW (ref 1.12–1.32)
HCT: 24 % — ABNORMAL LOW (ref 39.0–52.0)
HCT: 26 % — ABNORMAL LOW (ref 39.0–52.0)
HCT: 26 % — ABNORMAL LOW (ref 39.0–52.0)
Hemoglobin: 8.2 g/dL — ABNORMAL LOW (ref 13.0–17.0)
Hemoglobin: 8.5 g/dL — ABNORMAL LOW (ref 13.0–17.0)
Hemoglobin: 8.8 g/dL — ABNORMAL LOW (ref 13.0–17.0)
Hemoglobin: 8.8 g/dL — ABNORMAL LOW (ref 13.0–17.0)
Hemoglobin: 8.5 g/dL — ABNORMAL LOW (ref 13.0–17.0)
O2 Saturation: 49 %
O2 Saturation: 52 %
O2 Saturation: 57 %
O2 Saturation: 57 %
O2 Saturation: 57 %
O2 Saturation: 63 %
O2 Saturation: 64 %
O2 Saturation: 49 %
O2 Saturation: 56 %
O2 Saturation: 55 %
O2 Saturation: 54 %
Patient temperature: 100.5
Patient temperature: 101.5
Patient temperature: 99
Patient temperature: 99
Patient temperature: 99
Patient temperature: 99.6
Potassium: 3.9 mEq/L (ref 3.5–5.1)
Potassium: 4 mEq/L (ref 3.5–5.1)
Potassium: 4.2 mEq/L (ref 3.5–5.1)
Potassium: 4.5 mEq/L (ref 3.5–5.1)
Potassium: 4.5 mEq/L (ref 3.5–5.1)
Potassium: 5.2 mEq/L — ABNORMAL HIGH (ref 3.5–5.1)
Potassium: 4 mEq/L (ref 3.5–5.1)
Sodium: 140 mEq/L (ref 135–145)
Sodium: 142 mEq/L (ref 135–145)
Sodium: 142 mEq/L (ref 135–145)
Sodium: 142 mEq/L (ref 135–145)
Sodium: 142 mEq/L (ref 135–145)
Sodium: 143 mEq/L (ref 135–145)
Sodium: 143 mEq/L (ref 135–145)
TCO2: 30 mmol/L (ref 0–100)
TCO2: 32 mmol/L (ref 0–100)
TCO2: 36 mmol/L (ref 0–100)
TCO2: 36 mmol/L (ref 0–100)
TCO2: 40 mmol/L (ref 0–100)
pCO2, Ven: 58.1 mmHg — ABNORMAL HIGH (ref 45.0–50.0)
pCO2, Ven: 59.4 mmHg — ABNORMAL HIGH (ref 45.0–50.0)
pCO2, Ven: 61.3 mmHg — ABNORMAL HIGH (ref 45.0–50.0)
pCO2, Ven: 68.1 mmHg — ABNORMAL HIGH (ref 45.0–50.0)
pCO2, Ven: 50.3 mmHg — ABNORMAL HIGH (ref 45.0–50.0)
pH, Ven: 7.305 — ABNORMAL HIGH (ref 7.250–7.300)
pH, Ven: 7.297 (ref 7.250–7.300)
pH, Ven: 7.349 — ABNORMAL HIGH (ref 7.250–7.300)
pO2, Ven: 35 mmHg (ref 30.0–45.0)
pO2, Ven: 38 mmHg (ref 30.0–45.0)
pO2, Ven: 40 mmHg (ref 30.0–45.0)
pO2, Ven: 30 mmHg (ref 30.0–45.0)
pO2, Ven: 32 mmHg (ref 30.0–45.0)
pO2, Ven: 29 mmHg — CL (ref 30.0–45.0)
pO2, Ven: 28 mmHg — CL (ref 30.0–45.0)

## 2011-09-10 LAB — POCT I-STAT 7, (LYTES, BLD GAS, ICA,H+H)
Acid-Base Excess: 4 mmol/L — ABNORMAL HIGH (ref 0.0–2.0)
Acid-Base Excess: 6 mmol/L — ABNORMAL HIGH (ref 0.0–2.0)
Acid-Base Excess: 6 mmol/L — ABNORMAL HIGH (ref 0.0–2.0)
Acid-Base Excess: 10 mmol/L — ABNORMAL HIGH (ref 0.0–2.0)
Acid-Base Excess: 15 mmol/L — ABNORMAL HIGH (ref 0.0–2.0)
Acid-Base Excess: 16 mmol/L — ABNORMAL HIGH (ref 0.0–2.0)
Bicarbonate: 28.9 mEq/L — ABNORMAL HIGH (ref 20.0–24.0)
Bicarbonate: 30.3 mEq/L — ABNORMAL HIGH (ref 20.0–24.0)
Bicarbonate: 32.2 mEq/L — ABNORMAL HIGH (ref 20.0–24.0)
Bicarbonate: 32.6 mEq/L — ABNORMAL HIGH (ref 20.0–24.0)
Bicarbonate: 34.3 mEq/L — ABNORMAL HIGH (ref 20.0–24.0)
Bicarbonate: 36.6 mEq/L — ABNORMAL HIGH (ref 20.0–24.0)
Bicarbonate: 39.1 mEq/L — ABNORMAL HIGH (ref 20.0–24.0)
Bicarbonate: 39.3 mEq/L — ABNORMAL HIGH (ref 20.0–24.0)
Calcium, Ion: 0.9 mmol/L — ABNORMAL LOW (ref 1.12–1.32)
Calcium, Ion: 0.96 mmol/L — ABNORMAL LOW (ref 1.12–1.32)
Calcium, Ion: 0.97 mmol/L — ABNORMAL LOW (ref 1.12–1.32)
Calcium, Ion: 0.99 mmol/L — ABNORMAL LOW (ref 1.12–1.32)
Calcium, Ion: 1.02 mmol/L — ABNORMAL LOW (ref 1.12–1.32)
Calcium, Ion: 1.05 mmol/L — ABNORMAL LOW (ref 1.12–1.32)
HCT: 24 % — ABNORMAL LOW (ref 39.0–52.0)
HCT: 24 % — ABNORMAL LOW (ref 39.0–52.0)
HCT: 25 % — ABNORMAL LOW (ref 39.0–52.0)
HCT: 25 % — ABNORMAL LOW (ref 39.0–52.0)
HCT: 26 % — ABNORMAL LOW (ref 39.0–52.0)
Hemoglobin: 8.2 g/dL — ABNORMAL LOW (ref 13.0–17.0)
Hemoglobin: 8.5 g/dL — ABNORMAL LOW (ref 13.0–17.0)
Hemoglobin: 8.5 g/dL — ABNORMAL LOW (ref 13.0–17.0)
Hemoglobin: 7.8 g/dL — ABNORMAL LOW (ref 13.0–17.0)
Hemoglobin: 8.8 g/dL — ABNORMAL LOW (ref 13.0–17.0)
Hemoglobin: 8.2 g/dL — ABNORMAL LOW (ref 13.0–17.0)
O2 Saturation: 95 %
O2 Saturation: 96 %
O2 Saturation: 92 %
O2 Saturation: 97 %
Patient temperature: 100.5
Patient temperature: 101.5
Patient temperature: 99
Patient temperature: 99
Patient temperature: 99
Potassium: 4.9 mEq/L (ref 3.5–5.1)
Potassium: 4 mEq/L (ref 3.5–5.1)
Potassium: 4.2 mEq/L (ref 3.5–5.1)
Potassium: 3.7 mEq/L (ref 3.5–5.1)
Potassium: 3.9 mEq/L (ref 3.5–5.1)
Sodium: 142 mEq/L (ref 135–145)
Sodium: 142 mEq/L (ref 135–145)
Sodium: 139 mEq/L (ref 135–145)
Sodium: 140 mEq/L (ref 135–145)
Sodium: 137 mEq/L (ref 135–145)
Sodium: 137 mEq/L (ref 135–145)
TCO2: 31 mmol/L (ref 0–100)
TCO2: 34 mmol/L (ref 0–100)
TCO2: 40 mmol/L (ref 0–100)
TCO2: 41 mmol/L (ref 0–100)
pCO2 arterial: 48.9 mmHg — ABNORMAL HIGH (ref 35.0–45.0)
pCO2 arterial: 50.9 mmHg — ABNORMAL HIGH (ref 35.0–45.0)
pCO2 arterial: 56.6 mmHg — ABNORMAL HIGH (ref 35.0–45.0)
pCO2 arterial: 56.7 mmHg — ABNORMAL HIGH (ref 35.0–45.0)
pCO2 arterial: 60.8 mmHg (ref 35.0–45.0)
pCO2 arterial: 43.3 mmHg (ref 35.0–45.0)
pH, Arterial: 7.333 — ABNORMAL LOW (ref 7.350–7.450)
pH, Arterial: 7.367 (ref 7.350–7.450)
pH, Arterial: 7.415 (ref 7.350–7.450)
pH, Arterial: 7.476 — ABNORMAL HIGH (ref 7.350–7.450)
pH, Arterial: 7.536 — ABNORMAL HIGH (ref 7.350–7.450)
pO2, Arterial: 102 mmHg — ABNORMAL HIGH (ref 80.0–100.0)
pO2, Arterial: 71 mmHg — ABNORMAL LOW (ref 80.0–100.0)
pO2, Arterial: 74 mmHg — ABNORMAL LOW (ref 80.0–100.0)
pO2, Arterial: 89 mmHg (ref 80.0–100.0)
pO2, Arterial: 93 mmHg (ref 80.0–100.0)
pO2, Arterial: 94 mmHg (ref 80.0–100.0)
pO2, Arterial: 60 mmHg — ABNORMAL LOW (ref 80.0–100.0)

## 2011-09-10 LAB — RENAL FUNCTION PANEL
CO2: 34 mEq/L — ABNORMAL HIGH (ref 19–32)
Calcium: 10.1 mg/dL (ref 8.4–10.5)
Calcium: 10.3 mg/dL (ref 8.4–10.5)
Creatinine, Ser: 9.84 mg/dL — ABNORMAL HIGH (ref 0.50–1.35)
GFR calc Af Amer: 5 mL/min — ABNORMAL LOW (ref >90)
Glucose, Bld: 102 mg/dL — ABNORMAL HIGH (ref 70–99)
Glucose, Bld: 101 mg/dL — ABNORMAL HIGH (ref 70–99)
Phosphorus: 4.4 mg/dL (ref 2.3–4.6)
Sodium: 141 mEq/L (ref 135–145)

## 2011-09-10 LAB — CBC
Hemoglobin: 9.1 g/dL — ABNORMAL LOW (ref 13.0–17.0)
MCH: 28.8 pg (ref 26.0–34.0)
MCHC: 29.9 g/dL — ABNORMAL LOW (ref 30.0–36.0)

## 2011-09-10 LAB — MAGNESIUM: Magnesium: 1.9 mg/dL (ref 1.5–2.5)

## 2011-09-10 MED ORDER — FAMOTIDINE 40 MG/5ML PO SUSR
20.0000 mg | Freq: Every day | ORAL | Status: DC
  Administered 2011-09-11 – 2011-09-15 (×4): 20 mg
  Filled 2011-09-10 (×6): qty 2.5

## 2011-09-10 NOTE — Progress Notes (Signed)
RT placed patient on 6 cc 500 VT and RR 12 per verbal MD order. RT will continue to monitor.

## 2011-09-10 NOTE — Progress Notes (Signed)
eLink Physician-Brief Progress Note Patient Name: Gary Rivera DOB: February 03, 1961 MRN: [id number]  Date of Service  09/10/2011   HPI/Events of Note   Combined metabolic and resp alkalosis in setting of CVVHD.  Suspect baseline pCO2 is not 43.  eICU Interventions  Change TVol to 6cc/kg and decrease RR to 12, f/u ABG in AM      [name] 09/10/2011, 11:36 PM

## 2011-09-10 NOTE — Consult Note (Signed)
Patient: Gary Rivera DOB: 01-29-1961 Date of Admission: 09/07/2011            Pulmonary consult  Date of Consult: 09/10/2011 MD requesting consult:  Rizwan ([facility])  Reason for consult: respiratory failure   HPI -  51yo male with hx HTN, chronic renal failure on PD, CHF presented 6/19 to ER with c/o ~20 lbs weight gain and SOB.  He was admitted by hospitalist and seen by renal who rx with further PD and lasix.  On 6/20 he developed worsening resp distress requiring bipap and PCCM consulted - hypercarbic resp failure with BL infiltrates ? Aspiration vs edema    Filed Vitals:   09/10/11 0406 09/10/11 0500 09/10/11 0805 09/10/11 0823  BP: 184/91 106/54  120/63  Pulse: 86 69  73  Temp:   100.5 F (38.1 C)   TempSrc:      Resp: 24 22  20   Height:      Weight:  147.1 kg (324 lb 4.8 oz)    SpO2: 100% 99%  99%     CBC    Component Value Date/Time   WBC 5.9 09/10/2011 0415   RBC 3.16* 09/10/2011 0415   HGB 9.1* 09/10/2011 0415   HCT 30.4* 09/10/2011 0415   PLT 108* 09/10/2011 0415   MCV 96.2 09/10/2011 0415   MCH 28.8 09/10/2011 0415   MCHC 29.9* 09/10/2011 0415   RDW 17.1* 09/10/2011 0415   LYMPHSABS 1.0 09/08/2011 0300   MONOABS 0.3 09/08/2011 0300   EOSABS 0.3 09/08/2011 0300   BASOSABS 0.0 09/08/2011 0300     BMET    Component Value Date/Time   NA 141 09/10/2011 0415   K 4.2 09/10/2011 0415   CL 93* 09/10/2011 0415   CO2 32 09/10/2011 0415   GLUCOSE 102* 09/10/2011 0415   BUN 74* 09/10/2011 0415   CREATININE 11.70* 09/10/2011 0415   CALCIUM 10.1 09/10/2011 0415   CALCIUM 8.0* 12/04/2009 0455   GFRNONAA 4* 09/10/2011 0415   GFRAA 5* 09/10/2011 0415     ABG    Component Value Date/Time   PHART 7.464* 09/10/2011 0224   PCO2ART 48.9* 09/10/2011 0224   PO2ART 94.0 09/10/2011 0224   HCO3 34.6* 09/10/2011 0224   TCO2 36 09/10/2011 0224   ACIDBASEDEF 0.8 09/09/2011 0340   O2SAT 97.0 09/10/2011 0224   Brief:  6/22 : sedated on vent  CRRT     EXAM: General:  Obese  male,intubated Neuro: sedated  CV: RRR, distant heart sounds,  PULM: coarse BS  GI: abd obese, round, mildly distended, PD cath Extremities:  Warm and dry, tr  edema  Derm: no rash or skin breakdown   ASSESSMENT AND PLAN  PULMONARY  Lab 09/10/11 0224 09/10/11 0219 09/10/11 0032 09/10/11 0026 09/09/11 2218 09/09/11 2007 09/09/11 1748  PHART 7.464* -- 7.367 -- 7.367 7.333* 7.333*  PCO2ART 48.9* -- 60.8* -- 56.6* 62.1* 57.2*  PO2ART 94.0 -- 102.0* -- 89.0 74.0* 71.0*  HCO3 34.6* 34.7* 34.3* 34.0* 32.2* -- --  O2SAT 97.0 57.0 97.0 49.0 96.0 -- --   Ventilator Settings: Vent Mode:  [-] PRVC FiO2 (%):  [50 %-70.5 %] 50 % Set Rate:  [20 bmp] 20 bmp Vt Set:  [570 mL] 570 mL PEEP:  [10 cmH20] 10 cmH20 Plateau Pressure:  [24 cmH20-32 cmH20] 28 cmH20 CXR:  Diffuse edema resolved. Persistent bibasilar atelectasis and left  lower lobe opacity.  ETT:  6/21 >>  A:  Acute on chronic hypercarbic resp failure -- multifactorial r/t  decompensated CHF c/b ESRD (inability to compensate metabolically) with likely underlying OSA/OHS, pulm HTN.   Appears to have OSA, likely needs CPAP or even BIPAP after extubation   P:   --full vent support --cxr daily -sbt when appropriate -Volume removal - see below   CARDIOVASCULAR  Lab 09/09/11 1600 09/09/11 1212 09/09/11 0345 09/08/11 0247 09/07/11 2000  TROPONINI <0.30 <0.30 <0.30 <0.30 --  LATICACIDVEN -- -- -- -- --  PROBNP -- -- -- -- 24858.0*   ECG:  Nsr, no acute ischemia Lines: none  A: Acute on chronic systolic CHF - EF 40-45% (2011 echo) Moderate mitral regurgitation  Pulm HTN (pa sys 2011) P:  2D echo pending  Lasix drip per renal  cardiac enzymes neg CRRT per renal     RENAL  Lab 09/10/11 0415 09/10/11 0224 09/10/11 0219 09/10/11 0032 09/10/11 0026 09/09/11 1550 09/09/11 0345 09/08/11 0300 09/07/11 2000  NA 141 140 140 140 142 -- -- -- --  K 4.2 4.0 -- -- -- -- -- -- --  CL 93* -- -- -- -- 99 102 103 99  CO2 32 -- --  -- -- 26 28 23 25   BUN 74* -- -- -- -- 99* 110* 122* 116*  CREATININE 11.70* -- -- -- -- 16.07* 18.20* 18.07* 18.04*  CALCIUM 10.1 -- -- -- -- 8.4 7.3* 6.9* 7.2*  MG 1.9 -- -- -- -- -- -- -- --  PHOS 4.4 -- -- -- -- 5.4* -- 6.4* --    Intake/Output Summary (Last 24 hours) at 09/10/11 1001 Last data filed at 09/10/11 0900  Gross per 24 hour  Intake 3304.34 ml  Output   6545 ml  Net -3240.66 ml    Foley:  6/19  A:  ESRD - on PD with acute volume overload in setting systolic CHF P:   Renal managing  CRRT per renal  Lasix drip      GASTROINTESTINAL  Lab 09/10/11 0415 09/09/11 1550 09/08/11 1343 09/08/11 0300  AST -- -- 29 --  ALT -- -- 26 --  ALKPHOS -- -- 48 --  BILITOT -- -- 0.3 --  PROT -- -- 6.5 --  ALBUMIN 2.9* 3.0* 3.1* 2.9*    A:  No acute issues P:   monitor with PD   HEMATOLOGIC  Lab 09/10/11 0415 09/10/11 0224 09/10/11 0219 09/10/11 0032 09/10/11 0026 09/09/11 0345 09/08/11 0300 09/07/11 2000  HGB 9.1* 8.5* 8.5* 8.5* 9.2* -- -- --  HCT 30.4* 25.0* 25.0* 25.0* 27.0* -- -- --  PLT 108* -- -- -- -- 138* 123* 123*  INR -- -- -- -- -- -- -- --  APTT 30 -- -- -- -- -- -- --   A:  Anemia Thrombocytopenia  P:  No s/s bleeding Likely r/t chronic disease Transfuse for hgb <7 Cont aranesp   INFECTIOUS  Lab 09/10/11 0415 09/09/11 0345 09/08/11 0300 09/07/11 2000  WBC 5.9 6.0 5.5 4.0  PROCALCITON -- -- -- --   Cultures: BCx2 6/20 >>ng  Antibiotics: none  A:  No obvious infectious etology P:   Monitor fever, wbc curve    ENDOCRINE  A:  No acute issues  P:   Monitor glucose on bmet    NEUROLOGIC  A:  AMS/ lethargy - likely in setting hypercarbia and renal failure worsened by progressive hypoxemia Agitated off sedation   P: Judicious fentanyl and versed use on vent Consider CT head if not improving   BEST PRACTICE / DISPOSITION Level of Care:  ICU  Primary Service:  PCCM Consultants:  renal Code Status:  full Diet:  npo DVT Px:  SCDs GI Px:  none Skin Integrity:  intact Social / Family:  None at bedside      [facility] NP -C  [place] PCCM l [phone]   Seen on CCM rounds this morning with resident MD or ACNP above.  Pt examined and database reviewed. I agree with above findings, assessment and plan as reflected in the note above. 35 mins CCM time  Billy Fischer, MD;  PCCM service; Mobile [phone]

## 2011-09-10 NOTE — Progress Notes (Signed)
Subjective:  Events noted. Fevers overnight - hemodynamically stable, tolerating CRRT with volume removal of 200 per hour ! Still seems volume overloaded, high FIO2 and CXR with improvement, but still looks wet.  BP up and down, seems to be depending on sedation.   Objective Vital signs in last 24 hours: Filed Vitals:   09/10/11 0400 09/10/11 0402 09/10/11 0406 09/10/11 0500  BP: 117/52  184/91 106/54  Pulse: 71  86 69  Temp:  100.7 F (38.2 C)    TempSrc:  Oral    Resp: 22  24 22   Height:      Weight:    147.1 kg (324 lb 4.8 oz)  SpO2: 100%  100% 99%   Weight change: -7.1 kg (-15 lb 10.4 oz)  Intake/Output Summary (Last 24 hours) at 09/10/11 0730 Last data filed at 09/10/11 0600  Gross per 24 hour  Intake 3154.34 ml  Output   6244 ml  Net -3089.66 ml   Labs: Basic Metabolic Panel:  Lab 09/10/11 1610 09/10/11 0224 09/10/11 0219 09/09/11 1550 09/09/11 0345 09/08/11 0300  NA 141 140 140 -- -- --  K 4.2 4.0 3.9 -- -- --  CL 93* -- -- 99 102 --  CO2 32 -- -- 26 28 --  GLUCOSE 102* -- -- 86 105* --  BUN 74* -- -- 99* 110* --  CREATININE 11.70* -- -- 16.07* 18.20* --  CALCIUM 10.1 -- -- 8.4 7.3* --  ALB -- -- -- -- -- --  PHOS 4.4 -- -- 5.4* -- 6.4*   Liver Function Tests:  Lab 09/10/11 0415 09/09/11 1550 09/08/11 1343  AST -- -- 29  ALT -- -- 26  ALKPHOS -- -- 48  BILITOT -- -- 0.3  PROT -- -- 6.5  ALBUMIN 2.9* 3.0* 3.1*   No results found for this basename: LIPASE:3,AMYLASE:3 in the last 168 hours No results found for this basename: AMMONIA:3 in the last 168 hours CBC:  Lab 09/10/11 0415 09/10/11 0224 09/10/11 0219 09/09/11 0345 09/08/11 0300 09/07/11 2000  WBC 5.9 -- -- 6.0 5.5 --  NEUTROABS -- -- -- -- 4.0 --  HGB 9.1* 8.5* 8.5* -- -- --  HCT 30.4* 25.0* 25.0* -- -- --  MCV 96.2 -- -- 99.7 95.5 95.7  PLT 108* -- -- 138* 123* --   Cardiac Enzymes:  Lab 09/09/11 1600 09/09/11 1212 09/09/11 0345 09/08/11 1050 09/08/11 0247  CKTOTAL 1186* 1168* 1379* 1517*  1411*  CKMB 8.5* 7.7* 9.0* -- 8.7*  CKMBINDEX -- -- -- -- --  TROPONINI <0.30 <0.30 <0.30 -- <0.30   CBG:  Lab 09/09/11 0445  GLUCAP 104*    Iron Studies:   Basename 09/08/11 0300  IRON 46  TIBC 275  TRANSFERRIN --  FERRITIN 482*   Studies/Results: Dg Chest Port 1 View  09/09/2011  *RADIOLOGY REPORT*  Clinical Data: Insertion of hemodialysis catheter.  Pulmonary edema.  PORTABLE CHEST - 1 VIEW  Comparison: 6/21, 6/20, and 09/07/2011  Findings: Double-lumen left internal jugular vein hemodialysis catheter has been inserted and the tips are in the superior vena cava.  Right jugular vein catheter tip is also in the superior vena cava.  The patient has bilateral pulmonary edema with pulmonary vascular congestion, slightly increased.  No pneumothorax.  Endotracheal tube is 2.7 cm above the carina.  IMPRESSION: Dialysis catheter tips in the superior vena cava.  No pneumothorax. Increased pulmonary edema.  Original Report Authenticated By: Gwynn Burly, M.D.   Dg Chest [facility]  09/09/2011  *RADIOLOGY REPORT*  Clinical Data: Endotracheal tube and central line placement.  PORTABLE CHEST - 1 VIEW  Comparison: Chest radiograph performed 09/08/2011  Findings: The patient's endotracheal tube is seen ending nearly 2 cm above the carina.  This could be retracted approximately 1 cm. A right IJ line is noted ending within the right atrium; this should be retracted 2-3 cm.  The lungs are hypoexpanded.  Vascular congestion is noted, with dense bilateral central opacities and retrocardiac opacity.  This may reflect pulmonary edema or multifocal pneumonia.  A small left pleural effusion is suspected; no pneumothorax is seen.  The cardiomediastinal silhouette is borderline enlarged.  No acute osseous abnormalities are identified.  IMPRESSION:  1.  Endotracheal tube seen ending nearly 2 cm above the carina; this could be retracted approximately 1 cm. 2.  Right IJ line noted ending within the right atrium;  this should be retracted 2-3 cm. 3.  Lungs hypoexpanded; vascular congestion, with worsening dense bilateral central opacities and retrocardiac opacity.  This may reflect pulmonary edema or multifocal pneumonia.  Suspect small left pleural effusion. 4.  Borderline cardiomegaly.  These results were called by telephone on 09/09/2011  at  06:04 a.m. to  [facility] on [id number], who verbally acknowledged these results.  Original Report Authenticated By: Tonia Ghent, M.D.   Dg Chest Port 1 View  09/08/2011  *RADIOLOGY REPORT*  Clinical Data: Congestive heart failure.  Severe shortness of breath.  Pulmonary edema.  PORTABLE CHEST - 1 VIEW  Comparison: 09/07/2011 and 12/07/2009  Findings: Bilateral pulmonary edema persists with slight clearing at the right lung base.  No effusions.  Chronic cardiomegaly.  No osseous abnormality.  IMPRESSION: Slight improvement in pulmonary edema at the right base.  Original Report Authenticated By: Gwynn Burly, M.D.   Medications: Infusions:    . calcium gluconate infusion for CRRT 20 g (09/09/11 1600)  . fentaNYL infusion INTRAVENOUS 200 mcg/hr (09/10/11 0644)  . dialysis replacement fluid (prismasate) 250 mL/hr at 09/09/11 1141  . dialysate (PRISMASATE) 1,500 mL/hr at 09/10/11 0141  . propofol 15 mcg/kg/min (09/10/11 0500)  . sodium citrate 2 %/dextrose 2.5% solution 3000 mL 330 mL/hr at 09/09/11 1800  . DISCONTD: dialysis solution 4.25% low-MG/low-CA 2,000 mL (09/08/11 0826)    Scheduled Medications:    . antiseptic oral rinse  15 mL Mouth Rinse QID  . aspirin  325 mg Oral Daily  . calcitRIOL  0.25 mcg Oral Custom  . calcium carbonate  1 tablet Oral TID WC  . calcium gluconate  1 g Intravenous Once  . chlorhexidine  15 mL Mouth Rinse BID  . darbepoetin (ARANESP) injection - DIALYSIS  150 mcg Subcutaneous Q Thu-HD  . dextrose  25 mL Intravenous Once  . famotidine (PEPCID) IV  20 mg Intravenous Q12H  . furosemide  120 mg Intravenous Once  . furosemide   160 mg Intravenous Q12H  . insulin aspart  10 Units Intravenous Once  . multivitamin  1 tablet Oral Daily  . sevelamer  800 mg Oral TID WC  . sodium bicarbonate  50 mEq Intravenous Once  . sodium chloride  3 mL Intravenous Q12H  . sodium polystyrene  30 g Per Tube Once  . Thrombi-Pad  1 each Topical Once  . vecuronium      . vecuronium  8 mg Intravenous Once  . DISCONTD: heparin subcutaneous  5,000 Units Subcutaneous Q8H  . DISCONTD: insulin aspart  10 Units Subcutaneous Once  . DISCONTD: insulin aspart  10 Units  Intravenous Once  . DISCONTD: insulin regular  10 Units Subcutaneous Once    have reviewed scheduled and prn medications.  Physical Exam: General: intubated, sedated now with propofol Heart: RRR Lungs: decreased BS at bases Abdomen: obese, positive BS Extremities:  Still pitting edema Dialysis Access: PD cath, looks clean and dry   I Assessment/ Plan: Pt is a 51 y.o. yo male who was admitted on 09/07/2011 with  ESRD/PD- volume overload and uremia  Assessment/Plan: 1. CHF/volume overload- Did again ultrafilter overnight another 3 liters per CRRT.  Have seen some improvement in hypoxia and CXR but still appears overloaded.   Also has restrictive issue with abdomen full of fluid.  Pulm/CCM questioning possibility of aspiration as well.  Overall pulm status improved including air exchange.    2. ESRD - PD normally.  Says that he loves it and is not considering change to HD. Has done very well with CRRT.  My preference would be to keep on CRRT today in order to tune up more possibly get to point of extubation before we resume PD.  3. Anemia- continue aranesp.  Iron stores slightly low, would consider IV iron when more stable but not now.  Consider transfusion but only if hgb gets below 7-7.5. hgb trending up with diuresis 4. Secondary hyperparathyroidism- continue calcitriol and renvela and tums, renavite.  Oral meds on hold right now due to intubation.  Calcium creeping up as  happens with citrate.  Will likely only use another 24 hours.  5. HTN/volume- CRRT for volume removal as able 6. Respiratory failure and now with hypotension.  Just resp failure due to above or something else going on, work up in progress per CCM.  No abx per CCM 7. Hyperkalemia- resolved with CRRT  spoke to patients sister on 6/21 who had concerns about him because she knew he did not want HD.  I told her my plan of hopeful eventual return to PD therapy  Stephani Janak A   09/10/2011,7:30 AM  LOS: 3 days

## 2011-09-11 ENCOUNTER — Inpatient Hospital Stay (HOSPITAL_COMMUNITY): Payer: Medicare Other

## 2011-09-11 DIAGNOSIS — R0902 Hypoxemia: Secondary | ICD-10-CM

## 2011-09-11 LAB — CBC
HCT: 26.1 % — ABNORMAL LOW (ref 39.0–52.0)
Hemoglobin: 8 g/dL — ABNORMAL LOW (ref 13.0–17.0)
RBC: 2.8 MIL/uL — ABNORMAL LOW (ref 4.22–5.81)

## 2011-09-11 LAB — BLOOD GAS, ARTERIAL
Acid-Base Excess: 13.9 mmol/L — ABNORMAL HIGH (ref 0.0–2.0)
Bicarbonate: 39.1 mEq/L — ABNORMAL HIGH (ref 20.0–24.0)
FIO2: 0.4 %
O2 Saturation: 93 %
PEEP: 5 cmH2O
Patient temperature: 99.5
TCO2: 41 mmol/L (ref 0–100)
pO2, Arterial: 62.7 mmHg — ABNORMAL LOW (ref 80.0–100.0)

## 2011-09-11 LAB — POCT I-STAT EG7
Acid-Base Excess: 14 mmol/L — ABNORMAL HIGH (ref 0.0–2.0)
Acid-Base Excess: 14 mmol/L — ABNORMAL HIGH (ref 0.0–2.0)
Acid-Base Excess: 16 mmol/L — ABNORMAL HIGH (ref 0.0–2.0)
Bicarbonate: 41.3 mEq/L — ABNORMAL HIGH (ref 20.0–24.0)
Calcium, Ion: 0.75 mmol/L — ABNORMAL LOW (ref 1.12–1.32)
Calcium, Ion: 0.71 mmol/L — CL (ref 1.12–1.32)
Calcium, Ion: 0.68 mmol/L — CL (ref 1.12–1.32)
Calcium, Ion: 0.65 mmol/L — CL (ref 1.12–1.32)
HCT: 27 % — ABNORMAL LOW (ref 39.0–52.0)
HCT: 27 % — ABNORMAL LOW (ref 39.0–52.0)
HCT: 28 % — ABNORMAL LOW (ref 39.0–52.0)
Hemoglobin: 9.2 g/dL — ABNORMAL LOW (ref 13.0–17.0)
Hemoglobin: 9.9 g/dL — ABNORMAL LOW (ref 13.0–17.0)
O2 Saturation: 63 %
O2 Saturation: 47 %
Patient temperature: 97.5
Patient temperature: 98.8
Potassium: 3.6 mEq/L (ref 3.5–5.1)
Potassium: 4 mEq/L (ref 3.5–5.1)
Sodium: 138 mEq/L (ref 135–145)
TCO2: 43 mmol/L (ref 0–100)
pCO2, Ven: 55.9 mmHg — ABNORMAL HIGH (ref 45.0–50.0)
pCO2, Ven: 69.2 mmHg — ABNORMAL HIGH (ref 45.0–50.0)
pH, Ven: 7.453 — ABNORMAL HIGH (ref 7.250–7.300)
pH, Ven: 7.466 — ABNORMAL HIGH (ref 7.250–7.300)
pH, Ven: 7.384 — ABNORMAL HIGH (ref 7.250–7.300)
pO2, Ven: 27 mmHg — CL (ref 30.0–45.0)
pO2, Ven: 25 mmHg — CL (ref 30.0–45.0)
pO2, Ven: 27 mmHg — CL (ref 30.0–45.0)

## 2011-09-11 LAB — RENAL FUNCTION PANEL
CO2: 38 mEq/L — ABNORMAL HIGH (ref 19–32)
Calcium: 11.1 mg/dL — ABNORMAL HIGH (ref 8.4–10.5)
Chloride: 88 mEq/L — ABNORMAL LOW (ref 96–112)
GFR calc Af Amer: 8 mL/min — ABNORMAL LOW (ref >90)
GFR calc non Af Amer: 7 mL/min — ABNORMAL LOW (ref >90)
Glucose, Bld: 126 mg/dL — ABNORMAL HIGH (ref 70–99)
Potassium: 3.9 mEq/L (ref 3.5–5.1)
Sodium: 138 mEq/L (ref 135–145)

## 2011-09-11 LAB — POCT I-STAT 7, (LYTES, BLD GAS, ICA,H+H)
Acid-Base Excess: 17 mmol/L — ABNORMAL HIGH (ref 0.0–2.0)
Bicarbonate: 41 mEq/L — ABNORMAL HIGH (ref 20.0–24.0)
Bicarbonate: 44.1 mEq/L — ABNORMAL HIGH (ref 20.0–24.0)
HCT: 26 % — ABNORMAL LOW (ref 39.0–52.0)
Hemoglobin: 8.8 g/dL — ABNORMAL LOW (ref 13.0–17.0)
Hemoglobin: 8.8 g/dL — ABNORMAL LOW (ref 13.0–17.0)
O2 Saturation: 92 %
Patient temperature: 99.4
Sodium: 136 mEq/L (ref 135–145)
Sodium: 136 mEq/L (ref 135–145)
TCO2: 42 mmol/L (ref 0–100)
TCO2: 46 mmol/L (ref 0–100)
pCO2 arterial: 60.3 mmHg (ref 35.0–45.0)
pH, Arterial: 7.539 — ABNORMAL HIGH (ref 7.350–7.450)
pH, Arterial: 7.474 — ABNORMAL HIGH (ref 7.350–7.450)
pO2, Arterial: 69 mmHg — ABNORMAL LOW (ref 80.0–100.0)

## 2011-09-11 LAB — MAGNESIUM: Magnesium: 2 mg/dL (ref 1.5–2.5)

## 2011-09-11 LAB — APTT: aPTT: 34 seconds (ref 24–37)

## 2011-09-11 LAB — CALCIUM, IONIZED: Calcium, Ion: 1.17 mmol/L (ref 1.12–1.32)

## 2011-09-11 MED ORDER — DELFLEX-LM/2.5% DEXTROSE 396 MOSM/L IP SOLN
Freq: Every day | INTRAPERITONEAL | Status: DC
  Administered 2011-09-12 – 2011-09-19 (×3): via INTRAPERITONEAL

## 2011-09-11 MED ORDER — SODIUM CHLORIDE 0.9 % IV BOLUS (SEPSIS)
500.0000 mL | Freq: Once | INTRAVENOUS | Status: AC
  Administered 2011-09-11: 500 mL via INTRAVENOUS

## 2011-09-11 MED ORDER — FENTANYL CITRATE 0.05 MG/ML IJ SOLN
25.0000 ug | INTRAMUSCULAR | Status: DC | PRN
  Administered 2011-09-11 – 2011-09-12 (×8): 100 ug via INTRAVENOUS
  Administered 2011-09-12: 50 ug via INTRAVENOUS
  Administered 2011-09-13: 100 ug via INTRAVENOUS
  Administered 2011-09-13: 50 ug via INTRAVENOUS
  Administered 2011-09-13: 100 ug via INTRAVENOUS
  Administered 2011-09-13: 50 ug via INTRAVENOUS
  Filled 2011-09-11 (×7): qty 2
  Filled 2011-09-11: qty 4
  Filled 2011-09-11 (×4): qty 2

## 2011-09-11 MED ORDER — DEXTROSE 5 % IV SOLN
20.0000 g | Freq: Once | INTRAVENOUS | Status: AC
  Administered 2011-09-11: 20 g via INTRAVENOUS_CENTRAL
  Filled 2011-09-11: qty 200

## 2011-09-11 MED ORDER — HALOPERIDOL LACTATE 5 MG/ML IJ SOLN
5.0000 mg | Freq: Four times a day (QID) | INTRAMUSCULAR | Status: DC
  Administered 2011-09-11: 5 mg via INTRAVENOUS
  Filled 2011-09-11 (×5): qty 1

## 2011-09-11 MED ORDER — PRISMASOL BGK 4/2.5 32-4-2.5 MEQ/L IV SOLN
INTRAVENOUS | Status: AC
  Administered 2011-09-11: 12:00:00 via INTRAVENOUS_CENTRAL
  Filled 2011-09-11 (×2): qty 5000

## 2011-09-11 MED ORDER — HEPARIN SODIUM (PORCINE) 1000 UNIT/ML IJ SOLN
INTRAMUSCULAR | Status: AC
  Administered 2011-09-11: 3000 [IU]
  Filled 2011-09-11: qty 5

## 2011-09-11 NOTE — Progress Notes (Signed)
Patient: Gary Rivera DOB: 10/15/1960 Date of Admission: 09/07/2011            Pulmonary consult  Date of Consult: 09/11/2011 MD requesting consult:  Rizwan ([facility])  Reason for consult: respiratory failure   HPI -  51yo male with hx HTN, chronic renal failure on PD, CHF presented 6/19 to ER with c/o ~20 lbs weight gain and SOB.  He was admitted by hospitalist and seen by renal who rx with further PD and lasix.  On 6/20 he developed worsening resp distress requiring bipap and PCCM consulted - hypercarbic resp failure with BL infiltrates ? Aspiration vs edema    Filed Vitals:   09/11/11 0500 09/11/11 0600 09/11/11 0700 09/11/11 0808  BP: 114/73 118/52 100/50 152/75  Pulse: 73 69 74 80  Temp:    98.6 F (37 C)  TempSrc:      Resp: 20 16 18 24   Height:      Weight:      SpO2: 100% 88% 97% 99%     CBC    Component Value Date/Time   WBC 7.0 09/11/2011 0500   RBC 2.80* 09/11/2011 0500   HGB 8.0* 09/11/2011 0500   HCT 26.1* 09/11/2011 0500   PLT 97* 09/11/2011 0500   MCV 93.2 09/11/2011 0500   MCH 28.6 09/11/2011 0500   MCHC 30.7 09/11/2011 0500   RDW 16.3* 09/11/2011 0500   LYMPHSABS 1.0 09/08/2011 0300   MONOABS 0.3 09/08/2011 0300   EOSABS 0.3 09/08/2011 0300   BASOSABS 0.0 09/08/2011 0300     BMET    Component Value Date/Time   NA 138 09/11/2011 0500   K 3.9 09/11/2011 0500   CL 88* 09/11/2011 0500   CO2 38* 09/11/2011 0500   GLUCOSE 126* 09/11/2011 0500   BUN 46* 09/11/2011 0500   CREATININE 8.05* 09/11/2011 0500   CALCIUM 11.1* 09/11/2011 0500   CALCIUM 8.0* 12/04/2009 0455   GFRNONAA 7* 09/11/2011 0500   GFRAA 8* 09/11/2011 0500     ABG    Component Value Date/Time   PHART 7.474* 09/11/2011 0457   PCO2ART 60.3* 09/11/2011 0457   PO2ART 69.0* 09/11/2011 0457   HCO3 44.1* 09/11/2011 0457   TCO2 46 09/11/2011 0457   ACIDBASEDEF 0.8 09/09/2011 0340   O2SAT 93.0 09/11/2011 0457   Brief:  CRRT to stop today , changed back to PD per renal  Sedated on vent  Weaning this am    Starting to follow simple commands    EXAM: General:  Obese male,intubated Neuro: sedated  CV: RRR, distant heart sounds,  PULM: coarse BS  GI: abd obese, round, mildly distended, PD cath Extremities:  Warm and dry, tr  edema  Derm: no rash or skin breakdown   ASSESSMENT AND PLAN  PULMONARY  Lab 09/11/11 0457 09/11/11 0451 09/11/11 0428 09/11/11 0300 09/11/11 0107 09/10/11 2103 09/10/11 1915 09/10/11 1618  PHART 7.474* -- 7.413 -- -- 7.539* 7.569* 7.536*  PCO2ART 60.3* -- 63.0* -- -- 47.8* 43.1 46.2*  PO2ART 69.0* -- 62.7* -- -- 55.0* 53.0* 60.0*  HCO3 44.1* 43.2* 39.1* 41.3* 41.3* -- -- --  O2SAT 93.0 47.0 93.0 63.0 57.0 -- -- --   Ventilator Settings: Vent Mode:  [-] PRVC FiO2 (%):  [39.6 %-50.2 %] 40 % Set Rate:  [12 bmp-20 bmp] 12 bmp Vt Set:  [500 mL-570 mL] 500 mL PEEP:  [5 cmH20-8 cmH20] 5 cmH20 Plateau Pressure:  [20 cmH20-25 cmH20] 20 cmH20 CXR:  Worsening bibasilar atelectasis.  ETT:  6/21 >>  A:  Acute on chronic hypercarbic resp failure -- multifactorial r/t decompensated CHF c/b ESRD (inability to compensate metabolically) with likely underlying OSA/OHS, pulm HTN.   Appears to have OSA, likely needs CPAP or even BIPAP after extubation  >cxr worse, low grade fevers   P:   --full vent support --cxr daily -sbt when appropriate -Volume removal - see below  -consider IV abx  -check sputum cx   CARDIOVASCULAR  Lab 09/09/11 1600 09/09/11 1212 09/09/11 0345 09/08/11 0247 09/07/11 2000  TROPONINI <0.30 <0.30 <0.30 <0.30 --  LATICACIDVEN -- -- -- -- --  PROBNP -- -- -- -- 24858.0*   ECG:  Nsr, no acute ischemia Lines: none  A: Acute on chronic systolic CHF - EF 40-45% (2011 echo) Moderate mitral regurgitation  Pulm HTN (pa sys 2011) P:  2D echo pending ?done  Lasix drip per renal  cardiac enzymes neg CRRT changed back to PD per renal 6/23     RENAL  Lab 09/11/11 0500 09/11/11 0457 09/11/11 0451 09/11/11 0300 09/11/11 0107 09/10/11 1615  09/10/11 0415 09/09/11 1550 09/09/11 0345 09/08/11 0300  NA 138 136 138 139 138 -- -- -- -- --  K 3.9 3.9 -- -- -- -- -- -- -- --  CL 88* -- -- -- -- 90* 93* 99 102 --  CO2 38* -- -- -- -- 34* 32 26 28 --  BUN 46* -- -- -- -- 60* 74* 99* 110* --  CREATININE 8.05* -- -- -- -- 9.84* 11.70* 16.07* 18.20* --  CALCIUM 11.1* -- -- -- -- 10.3 10.1 8.4 7.3* --  MG 2.0 -- -- -- -- -- 1.9 -- -- --  PHOS 5.5* -- -- -- -- 4.4 4.4 5.4* -- 6.4*    Intake/Output Summary (Last 24 hours) at 09/11/11 0953 Last data filed at 09/11/11 0800  Gross per 24 hour  Intake 3354.87 ml  Output   8104 ml  Net -4749.13 ml    Foley:  6/19  A:  ESRD - on PD with acute volume overload in setting systolic CHF P:   Renal managing  CRRT >PD 6/23  Lasix drip per renal      GASTROINTESTINAL  Lab 09/11/11 0500 09/10/11 1615 09/10/11 0415 09/09/11 1550 09/08/11 1343  AST -- -- -- -- 29  ALT -- -- -- -- 26  ALKPHOS -- -- -- -- 48  BILITOT -- -- -- -- 0.3  PROT -- -- -- -- 6.5  ALBUMIN 2.8* 2.7* 2.9* 3.0* 3.1*    A:  No acute issues P:   monitor with PD   HEMATOLOGIC  Lab 09/11/11 0500 09/11/11 0457 09/11/11 0451 09/11/11 0300 09/11/11 0107 09/10/11 0415 09/09/11 0345 09/08/11 0300 09/07/11 2000  HGB 8.0* 8.8* 9.5* 9.2* 9.9* -- -- -- --  HCT 26.1* 26.0* 28.0* 27.0* 29.0* -- -- -- --  PLT 97* -- -- -- -- 108* 138* 123* 123*  INR -- -- -- -- -- -- -- -- --  APTT 34 -- -- -- -- 30 -- -- --   A:  Anemia Thrombocytopenia  P:  No s/s bleeding Likely r/t chronic disease Transfuse for hgb <7 Cont aranesp   INFECTIOUS  Lab 09/11/11 0500 09/10/11 0415 09/09/11 0345 09/08/11 0300 09/07/11 2000  WBC 7.0 5.9 6.0 5.5 4.0  PROCALCITON -- -- -- -- --   Cultures: BCx2 6/20 >>ng  Antibiotics:  A: concern on xray w/ worsening bibasilar atx, low grade fever  P:   Monitor  fever, wbc curve  Check sputum cx  ?add abx    ENDOCRINE  A:  No acute issues  P:   Monitor glucose on bmet     NEUROLOGIC  A:  AMS/ lethargy - likely in setting hypercarbia and renal failure worsened by progressive hypoxemia Agitated off sedation   P: Judicious fentanyl and versed use on vent Consider CT head if not improving   BEST PRACTICE / DISPOSITION Level of Care:  ICU  Primary Service:  PCCM Consultants:  renal Code Status:  full Diet:  npo DVT Px: SCDs GI Px:  none Skin Integrity:  intact Social / Family:  None at bedside      [facility] NP Sidonie Dickens PCCM  [phone]    Seen on CCM rounds this morning with resident MD or ACNP above.  Pt examined and database reviewed. I agree with above findings, assessment and plan as reflected in the note above. 40 mins CCM time. Tolerates PSV 10-15 cm H2O. Hopefully consider extubation in next day or two. Therefore, try to keep sedation as light as possible  Billy Fischer, MD;  PCCM service; Mobile [phone]

## 2011-09-11 NOTE — Progress Notes (Signed)
eLink Physician-Brief Progress Note Patient Name: Gary Rivera DOB: 04/19/1960 MRN: [id number]  Date of Service  09/11/2011   HPI/Events of Note  Agitated on dirpivan sedation  eICU Interventions  restratints haldol   Intervention Category Major Interventions: Delirium, psychosis, severe agitation - evaluation and management Intermediate Interventions: Other:  Taelar Gronewold 09/11/2011, 8:13 PM

## 2011-09-11 NOTE — Progress Notes (Signed)
Subjective:  Events noted. Still low grade temps hemodynamically stable, tolerating CRRT with volume removal of 200 per hour, now BP is down after 10 liters plus of volume removal I think we have near EDW Objective Vital signs in last 24 hours: Filed Vitals:   09/11/11 0400 09/11/11 0500 09/11/11 0600 09/11/11 0700  BP: 105/59 114/73 118/52 100/50  Pulse: 73 73 69 74  Temp: 99.5 F (37.5 C)     TempSrc: Oral     Resp: 13 20 16 18   Height:      Weight: 142.6 kg (314 lb 6 oz)     SpO2: 95% 100% 88% 97%   Weight change: -4.5 kg (-9 lb 14.7 oz)  Intake/Output Summary (Last 24 hours) at 09/11/11 0738 Last data filed at 09/11/11 0700  Gross per 24 hour  Intake 3542.57 ml  Output   8275 ml  Net -4732.43 ml   Labs: Basic Metabolic Panel:  Lab 09/11/11 9562 09/11/11 0457 09/11/11 0451 09/10/11 1615 09/10/11 0415  NA 138 136 138 -- --  K 3.9 3.9 4.0 -- --  CL 88* -- -- 90* 93*  CO2 38* -- -- 34* 32  GLUCOSE 126* -- -- 101* 102*  BUN 46* -- -- 60* 74*  CREATININE 8.05* -- -- 9.84* 11.70*  CALCIUM 11.1* -- -- 10.3 10.1  ALB -- -- -- -- --  PHOS 5.5* -- -- 4.4 4.4   Liver Function Tests:  Lab 09/11/11 0500 09/10/11 1615 09/10/11 0415 09/08/11 1343  AST -- -- -- 29  ALT -- -- -- 26  ALKPHOS -- -- -- 48  BILITOT -- -- -- 0.3  PROT -- -- -- 6.5  ALBUMIN 2.8* 2.7* 2.9* --   No results found for this basename: LIPASE:3,AMYLASE:3 in the last 168 hours No results found for this basename: AMMONIA:3 in the last 168 hours CBC:  Lab 09/11/11 0500 09/11/11 0457 09/11/11 0451 09/10/11 0415 09/09/11 0345 09/08/11 0300 09/07/11 2000  WBC 7.0 -- -- 5.9 6.0 -- --  NEUTROABS -- -- -- -- -- 4.0 --  HGB 8.0* 8.8* 9.5* -- -- -- --  HCT 26.1* 26.0* 28.0* -- -- -- --  MCV 93.2 -- -- 96.2 99.7 95.5 95.7  PLT 97* -- -- 108* 138* -- --   Cardiac Enzymes:  Lab 09/09/11 1600 09/09/11 1212 09/09/11 0345 09/08/11 1050 09/08/11 0247  CKTOTAL 1186* 1168* 1379* 1517* 1411*  CKMB 8.5* 7.7* 9.0* --  8.7*  CKMBINDEX -- -- -- -- --  TROPONINI <0.30 <0.30 <0.30 -- <0.30   CBG:  Lab 09/09/11 0445  GLUCAP 104*    Iron Studies:  No results found for this basename: IRON,TIBC,TRANSFERRIN,FERRITIN in the last 72 hours Studies/Results: Dg Chest Port 1 View  09/10/2011  *RADIOLOGY REPORT*  Clinical Data: Edema  PORTABLE CHEST - 1 VIEW  Comparison: 09/09/2011  Findings: Low volumes.  Mild cardiomegaly.  Stable endotracheal tube, right internal jugular vein center venous catheter and left internal jugular vein dialysis catheter.  NG tube placed beyond the gastroesophageal junction.  Pulmonary edema resolved.  Bibasilar atelectasis.  Left basilar opacity persists.  IMPRESSION: Diffuse edema resolved.  Persistent bibasilar atelectasis and left lower lobe opacity.  Original Report Authenticated By: Donavan Burnet, M.D.   Dg Chest Port 1 View  09/09/2011  *RADIOLOGY REPORT*  Clinical Data: Insertion of hemodialysis catheter.  Pulmonary edema.  PORTABLE CHEST - 1 VIEW  Comparison: 6/21, 6/20, and 09/07/2011  Findings: Double-lumen left internal jugular vein hemodialysis catheter has  been inserted and the tips are in the superior vena cava.  Right jugular vein catheter tip is also in the superior vena cava.  The patient has bilateral pulmonary edema with pulmonary vascular congestion, slightly increased.  No pneumothorax.  Endotracheal tube is 2.7 cm above the carina.  IMPRESSION: Dialysis catheter tips in the superior vena cava.  No pneumothorax. Increased pulmonary edema.  Original Report Authenticated By: Gwynn Burly, M.D.   Medications: Infusions:    . calcium gluconate infusion for CRRT 20 g (09/10/11 2000)  . fentaNYL infusion INTRAVENOUS 175 mcg/hr (09/10/11 2131)  . dialysis replacement fluid (prismasate) 250 mL/hr at 09/11/11 0407  . dialysate (PRISMASATE) 1,500 mL/hr at 09/11/11 0330  . propofol 13 mcg/kg/min (09/11/11 0510)  . sodium citrate 2 %/dextrose 2.5% solution 3000 mL 390 mL/hr  at 09/11/11 0422    Scheduled Medications:    . antiseptic oral rinse  15 mL Mouth Rinse QID  . aspirin  325 mg Oral Daily  . calcitRIOL  0.25 mcg Oral Custom  . calcium carbonate  1 tablet Oral TID WC  . chlorhexidine  15 mL Mouth Rinse BID  . darbepoetin (ARANESP) injection - DIALYSIS  150 mcg Subcutaneous Q Thu-HD  . famotidine  20 mg Per Tube Daily  . furosemide  120 mg Intravenous Once  . furosemide  160 mg Intravenous Q12H  . multivitamin  1 tablet Oral Daily  . sevelamer  800 mg Oral TID WC  . sodium chloride  3 mL Intravenous Q12H  . sodium polystyrene  30 g Per Tube Once  . Thrombi-Pad  1 each Topical Once  . DISCONTD: famotidine (PEPCID) IV  20 mg Intravenous Q12H    have reviewed scheduled and prn medications.  Physical Exam: General: intubated, sedated now with propofol off and on Heart: RRR Lungs: decreased BS at bases Abdomen: obese, positive BS Extremities:  Still pitting edema Dialysis Access: PD cath, looks clean and dry   I Assessment/ Plan: Pt is a 51 y.o. yo male who was admitted on 09/07/2011 with  ESRD/PD- volume overload and uremia  Assessment/Plan: 1. CHF/volume overload- Did again ultrafilter overnight.   Have seen some improvement in hypoxia and CXR.   Also has restrictive issue with abdomen full of fluid.  Pulm/CCM questioning possibility of aspiration as well.  Overall pulm status improved including air exchange.    2. ESRD - PD normally.  Says that he loves it and is not considering change to HD. Has done very well with CRRT, getting some much needed volume removal.  Will stop CRRT today and resume PD  3. Anemia- continue aranesp.  Iron stores slightly low, will give iron today.   Consider transfusion but only if hgb gets below 7-7.5. hgb trending up with diuresis 4. Secondary hyperparathyroidism- continue calcitriol and renvela and tums, renavite.  Oral meds on hold right now due to intubation.  Calcium creeping up as happens with citrate.  Will  stop CRRT today which should help.   5. HTN/volume- much improved 6. Respiratory failure and now with hypotension.  Just resp failure due to above or something else going on, work up in progress per CCM.  No abx per CCM 7. Hyperkalemia- resolved.  spoke to patients sister on 6/21 who had concerns about him because she knew he did not want HD.  I told her my plan of hopeful eventual return to PD therapy  Gary Rivera A   09/11/2011,7:38 AM  LOS: 4 days

## 2011-09-11 NOTE — Progress Notes (Signed)
Pt. Failed 5/5, increased PS +14, tolerating well at this time.

## 2011-09-11 NOTE — Progress Notes (Signed)
eLink Physician-Brief Progress Note Patient Name: Gary Rivera DOB: May 20, 1960 MRN: [id number]  Date of Service  09/11/2011   HPI/Events of Note   Hypotension map 58. SBP 84  eICU Interventions  Fluid bolus Hold lasix 160mg    Intervention Category Major Interventions: Hypotension - evaluation and management  Aanika Defoor 09/11/2011, 10:09 PM

## 2011-09-12 ENCOUNTER — Inpatient Hospital Stay (HOSPITAL_COMMUNITY): Payer: Medicare Other

## 2011-09-12 LAB — RENAL FUNCTION PANEL
BUN: 45 mg/dL — ABNORMAL HIGH (ref 6–23)
CO2: 35 mEq/L — ABNORMAL HIGH (ref 19–32)
Chloride: 86 mEq/L — ABNORMAL LOW (ref 96–112)
Glucose, Bld: 102 mg/dL — ABNORMAL HIGH (ref 70–99)
Potassium: 4.1 mEq/L (ref 3.5–5.1)

## 2011-09-12 LAB — MAGNESIUM: Magnesium: 1.9 mg/dL (ref 1.5–2.5)

## 2011-09-12 LAB — CBC
Hemoglobin: 8.2 g/dL — ABNORMAL LOW (ref 13.0–17.0)
MCHC: 29.9 g/dL — ABNORMAL LOW (ref 30.0–36.0)
RDW: 16.2 % — ABNORMAL HIGH (ref 11.5–15.5)
WBC: 6.6 10*3/uL (ref 4.0–10.5)

## 2011-09-12 LAB — BLOOD GAS, ARTERIAL
Acid-Base Excess: 13.4 mmol/L — ABNORMAL HIGH (ref 0.0–2.0)
Bicarbonate: 37.7 mEq/L — ABNORMAL HIGH (ref 20.0–24.0)
FIO2: 0.4 %
O2 Saturation: 97.2 %
Patient temperature: 100
pO2, Arterial: 67.4 mmHg — ABNORMAL LOW (ref 80.0–100.0)

## 2011-09-12 LAB — TRIGLYCERIDES: Triglycerides: 136 mg/dL (ref <150)

## 2011-09-12 LAB — IRON AND TIBC
Iron: 21 ug/dL — ABNORMAL LOW (ref 42–135)
Saturation Ratios: 9 % — ABNORMAL LOW (ref 20–55)
TIBC: 227 ug/dL (ref 215–435)
UIBC: 206 ug/dL (ref 125–400)

## 2011-09-12 MED ORDER — NEPRO/CARBSTEADY PO LIQD
1000.0000 mL | ORAL | Status: DC
  Administered 2011-09-12: 1000 mL
  Filled 2011-09-12 (×3): qty 1000

## 2011-09-12 MED ORDER — MIDAZOLAM HCL 2 MG/2ML IJ SOLN
2.0000 mg | INTRAMUSCULAR | Status: DC | PRN
  Administered 2011-09-12: 2 mg via INTRAVENOUS
  Administered 2011-09-12: 4 mg via INTRAVENOUS
  Administered 2011-09-12 (×3): 2 mg via INTRAVENOUS
  Administered 2011-09-13 (×2): 4 mg via INTRAVENOUS
  Administered 2011-09-13 (×2): 2 mg via INTRAVENOUS
  Filled 2011-09-12: qty 2
  Filled 2011-09-12 (×2): qty 4
  Filled 2011-09-12 (×3): qty 2
  Filled 2011-09-12 (×2): qty 4

## 2011-09-12 NOTE — Progress Notes (Signed)
Pt placed back on full support do to complaining of SOB and low mve. Pt tolerating full support. RT will continue to monitor.

## 2011-09-12 NOTE — Consult Note (Signed)
[facility]  Subjective  Intubated, sedated. CVVH stopped.  Currently on CCPD (Q6 hr exchanges-2L each exchange)  Objective: Vital signs in last 24 hours: Blood pressure 134/64, pulse 86, temperature 100.8 F (38.2 C), temperature source Oral, resp. rate 27, height 6\' 2"  (1.88 m), weight 140.1 kg (308 lb 13.8 oz), SpO2 96.00%.  Weight 154 kg on 21 June--weight today 140.1 kg    PHYSICAL EXAM General--intubated, sedated Chest--3 lumen cath R IJ, HD cath in L IJ, rhonchi Heart--no rub Abd--PD cath exit site ok Extr--no edema  Lab Results:   Lab 09/12/11 0500 09/11/11 0500 09/11/11 0457 09/10/11 1615  NA 134* 138 136 --  K 4.1 3.9 3.9 --  CL 86* 88* -- 90*  CO2 35* 38* -- 34*  BUN 45* 46* -- 60*  CREATININE 8.71* 8.05* -- 9.84*  ALB -- -- -- --  GLUCOSE 102* -- -- --  CALCIUM 9.1 11.1* -- 10.3  PHOS 4.7* 5.5* -- 4.4     Basename 09/12/11 0500 09/11/11 0500  WBC 6.6 7.0  HGB 8.2* 8.0*  HCT 27.4* 26.1*  PLT 129* 97*     Assessment/Plan: 1. CHF/volume overload-CXR still looks wet. PO2 67 on 40 % FIO2.  Overall pulm status improved including air exchange. D/C lasix 2. ESRD - PD normally at home--but looks like he wasn't doing it at home.  Will increase to 6 exchanges per day and increase vol to 2.5 L.  Continue 2.5 % dextrose unless BP.  I think foley should come out   3. Anemia- continue aranesp.Check iron stores.  Hgb 8.2 today  4. Secondary hyperparathyroidism- continue calcitriol and renvela and tums, renavite. Oral meds on hold right now due to intubation. C 9.1, phos 4.7--just right now. 5. HTN/volume- much improved.  CXR still looks wet.  Will use 2.5% dextrose in PD bags %  6. Respiratory failure-BP now  134/64.  Off pressors, off antibiotics,  Extubate per Pulmonary  7. Hyperkalemia- resolved.     LOS: 5 days   Lakisa Lotz F 09/12/2011,9:38 AM   .labalb

## 2011-09-12 NOTE — Progress Notes (Signed)
Patient: Gary Rivera DOB: 02/19/1961 Date of Admission: 09/07/2011            Pulmonary consult  Date of Consult: 09/12/2011 MD requesting consult:  Rizwan ([facility])  Reason for consult: respiratory failure   HPI -  51yo male with hx HTN, chronic renal failure on PD, CHF presented 6/19 to ER with c/o ~20 lbs weight gain and SOB.  He was admitted by hospitalist and seen by renal who rx with further PD and lasix.  On 6/20 he developed worsening resp distress requiring bipap and PCCM consulted - hypercarbic resp failure with BL infiltrates ? Aspiration vs edema   SUBJ: Failed SBT. RASS -2. + F/C intermittently  Filed Vitals:   09/12/11 1537 09/12/11 1545 09/12/11 1600 09/12/11 1700  BP:   149/93 147/78  Pulse: 94 94 88   Temp:   99.6 F (37.6 C)   TempSrc:   Oral   Resp: 21 22 20    Height:      Weight:      SpO2: 100% 100% 100% 100%  EXAM: General:  Obese male,intubated Neuro: sedated  CV: RRR, distant heart sounds PULM: coarse BS  GI: abd obese, round, mildly distended, PD cath Extremities:  Warm and dry, tr  edema  Derm: no rash or skin breakdown   CXR: NSC   CBC    Component Value Date/Time   WBC 6.6 09/12/2011 0500   RBC 2.90* 09/12/2011 0500   HGB 8.2* 09/12/2011 0500   HCT 27.4* 09/12/2011 0500   PLT 129* 09/12/2011 0500   MCV 94.5 09/12/2011 0500   MCH 28.3 09/12/2011 0500   MCHC 29.9* 09/12/2011 0500   RDW 16.2* 09/12/2011 0500   LYMPHSABS 1.0 09/08/2011 0300   MONOABS 0.3 09/08/2011 0300   EOSABS 0.3 09/08/2011 0300   BASOSABS 0.0 09/08/2011 0300     BMET    Component Value Date/Time   NA 134* 09/12/2011 0500   K 4.1 09/12/2011 0500   CL 86* 09/12/2011 0500   CO2 35* 09/12/2011 0500   GLUCOSE 102* 09/12/2011 0500   BUN 45* 09/12/2011 0500   CREATININE 8.71* 09/12/2011 0500   CALCIUM 9.1 09/12/2011 0500   CALCIUM 8.0* 12/04/2009 0455   GFRNONAA 6* 09/12/2011 0500   GFRAA 7* 09/12/2011 0500     ABG    Component Value Date/Time   PHART 7.483* 09/12/2011 0305   PCO2ART 51.3* 09/12/2011 0305   PO2ART 67.4* 09/12/2011 0305   HCO3 37.7* 09/12/2011 0305   TCO2 39.2 09/12/2011 0305   ACIDBASEDEF 0.8 09/09/2011 0340   O2SAT 97.2 09/12/2011 0305     ASSESSMENT AND PLAN  PULMONARY  Ventilator Settings: reviewed  ETT:  6/21 >>   A:  Acute on chronic hypercarbic resp failure -- multifactorial r/t decompensated CHF c/b ESRD (inability to compensate metabolically) with likely underlying OSA/OHS, pulm HTN.   Appears to have OSA, likely needs CPAP or even BIPAP after extubation  >cxr worse, low grade fevers   P:   --wean as tolerated -Daily WUA/SBT -Cont aggressive volume removal - see below  -consider IV abx  -check sputum cx    CARDIOVASCULAR  ECG:  Nsr, no acute ischemia  A: Acute on chronic systolic CHF - EF 40-45% (2011 echo) Moderate mitral regurgitation  Pulm HTN (pa sys 2011) P:  2D echo pending ?done  Lasix drip per renal  cardiac enzymes neg CRRT changed back to PD per renal 6/23     RENAL   Intake/Output Summary (  Last 24 hours) at 09/12/11 1748 Last data filed at 09/12/11 1700  Gross per 24 hour  Intake 1429.57 ml  Output     81 ml  Net 1348.57 ml    Foley:  6/19  A:  ESRD - on PD with acute volume overload in setting systolic CHF P:   Renal managing  CRRT >PD 6/23  Lasix drip per renal      GASTROINTESTINAL A:  No acute issues P:   monitor with PD   HEMATOLOGIC A:  Anemia Thrombocytopenia  P:  No s/s bleeding Likely r/t chronic disease Transfuse for hgb <7 Cont aranesp   INFECTIOUS Cultures: BCx2 6/20 >>ng BC X 2 6/23 >>  Urine 6/20 >> NEG Resp 6/23 >>  Antibiotics:  A: Transient fever - no obvious source  P:   Monitor off abx   ENDOCRINE  A:  No acute issues  P:   Monitor glucose on bmet    NEUROLOGIC  A:  AMS/ lethargy - likely in setting hypercarbia and renal failure worsened by progressive hypoxemia Agitated off sedation   P: Judicious fentanyl and versed use on  vent Consider CT head if not improving   BEST PRACTICE / DISPOSITION Level of Care:  ICU  Primary Service:  PCCM Consultants:  renal Code Status:  full Diet:  TFs 6/24 DVT Px: SCDs GI Px:  none Skin Integrity:  intact Social / Family:  None at bedside      35 mins CCM time   Billy Fischer, MD ; [facility] service Mobile [phone].  After 5:30 PM or weekends, call [phone]

## 2011-09-12 NOTE — Progress Notes (Signed)
Pt did not tolerated SBT. Tried on 5/10 again didn't tolerate. Pt appears comfortable on 5/14.

## 2011-09-13 ENCOUNTER — Inpatient Hospital Stay (HOSPITAL_COMMUNITY): Payer: Medicare Other

## 2011-09-13 DIAGNOSIS — R0989 Other specified symptoms and signs involving the circulatory and respiratory systems: Secondary | ICD-10-CM

## 2011-09-13 LAB — CBC
MCH: 29 pg (ref 26.0–34.0)
MCV: 94.1 fL (ref 78.0–100.0)
Platelets: 134 10*3/uL — ABNORMAL LOW (ref 150–400)
RDW: 16.3 % — ABNORMAL HIGH (ref 11.5–15.5)
WBC: 8.8 10*3/uL (ref 4.0–10.5)

## 2011-09-13 LAB — RENAL FUNCTION PANEL
Albumin: 3 g/dL — ABNORMAL LOW (ref 3.5–5.2)
Calcium: 8.5 mg/dL (ref 8.4–10.5)
Chloride: 89 mEq/L — ABNORMAL LOW (ref 96–112)
Creatinine, Ser: 11.47 mg/dL — ABNORMAL HIGH (ref 0.50–1.35)
GFR calc non Af Amer: 4 mL/min — ABNORMAL LOW (ref >90)

## 2011-09-13 LAB — CULTURE, RESPIRATORY W GRAM STAIN

## 2011-09-13 MED ORDER — DELFLEX-LC/2.5% DEXTROSE 394 MOSM/L IP SOLN
INTRAPERITONEAL | Status: DC
  Administered 2011-09-17: 2500 mL via INTRAPERITONEAL
  Administered 2011-09-18 – 2011-09-19 (×3): 3000 mL via INTRAPERITONEAL

## 2011-09-13 MED ORDER — NEPRO/CARBSTEADY PO LIQD
1000.0000 mL | ORAL | Status: DC
  Administered 2011-09-13: 1000 mL
  Filled 2011-09-13 (×6): qty 1000

## 2011-09-13 MED ORDER — SODIUM CHLORIDE 0.9 % IV SOLN
125.0000 mg | INTRAVENOUS | Status: DC
  Filled 2011-09-13: qty 10

## 2011-09-13 MED ORDER — SODIUM CHLORIDE 0.9 % IV SOLN
25.0000 mg | Freq: Once | INTRAVENOUS | Status: AC
  Administered 2011-09-13: 25 mg via INTRAVENOUS
  Filled 2011-09-13: qty 2

## 2011-09-13 MED ORDER — SODIUM CHLORIDE 0.9 % IV SOLN
125.0000 mg | INTRAVENOUS | Status: AC
  Administered 2011-09-13 – 2011-09-20 (×4): 125 mg via INTRAVENOUS
  Filled 2011-09-13 (×8): qty 10

## 2011-09-13 MED ORDER — HEPARIN SODIUM (PORCINE) 1000 UNIT/ML IJ SOLN: 500.0000 [IU] | INTRAMUSCULAR | Status: DC

## 2011-09-13 MED ORDER — DELFLEX-LC/4.25% DEXTROSE 483 MOSM/L IP SOLN: INTRAPERITONEAL | Status: DC

## 2011-09-13 MED ORDER — HYDRALAZINE HCL 20 MG/ML IJ SOLN
10.0000 mg | INTRAMUSCULAR | Status: DC | PRN
  Filled 2011-09-13: qty 2

## 2011-09-13 MED ORDER — FENTANYL CITRATE 0.05 MG/ML IJ SOLN
25.0000 ug | INTRAMUSCULAR | Status: DC | PRN
  Administered 2011-09-13 – 2011-09-14 (×5): 50 ug via INTRAVENOUS
  Filled 2011-09-13 (×5): qty 2

## 2011-09-13 MED ORDER — FENTANYL 25 MCG/HR TD PT72
25.0000 ug | MEDICATED_PATCH | TRANSDERMAL | Status: DC
  Administered 2011-09-13: 25 ug via TRANSDERMAL
  Filled 2011-09-13 (×2): qty 1

## 2011-09-13 MED ORDER — HEPARIN SODIUM (PORCINE) 1000 UNIT/ML IJ SOLN
1500.0000 [IU] | INTRAMUSCULAR | Status: DC
  Administered 2011-09-17 – 2011-09-20 (×4): 1500 [IU] via INTRAPERITONEAL
  Filled 2011-09-13 (×9): qty 1.5

## 2011-09-13 MED ORDER — MIDAZOLAM HCL 2 MG/2ML IJ SOLN
1.0000 mg | INTRAMUSCULAR | Status: DC | PRN
  Administered 2011-09-13 – 2011-09-14 (×4): 2 mg via INTRAVENOUS
  Filled 2011-09-13 (×5): qty 2

## 2011-09-13 MED ORDER — CLONAZEPAM 0.5 MG PO TABS
0.5000 mg | ORAL_TABLET | Freq: Two times a day (BID) | ORAL | Status: DC
  Administered 2011-09-13 (×2): 0.5 mg via ORAL
  Filled 2011-09-13 (×3): qty 1

## 2011-09-13 MED ORDER — METOPROLOL TARTRATE 1 MG/ML IV SOLN: 2.5000 mg | INTRAVENOUS | Status: DC | PRN

## 2011-09-13 MED ORDER — CLONAZEPAM 0.1 MG/ML ORAL SUSPENSION
0.5000 mg | Freq: Two times a day (BID) | ORAL | Status: DC
  Filled 2011-09-13 (×2): qty 5

## 2011-09-13 NOTE — Progress Notes (Signed)
Gary Rivera  Subjective:  Eyes open, responds to questions.  Says Dr. Louis Meckel in [facility] is his nephrologist, but that he lives in [place]   Objective: Vital signs in last 24 hours: Blood pressure 135/71, pulse 93, temperature 100 F (37.8 C), temperature source Oral, resp. rate 20, height 6\' 2"  (1.88 m), weight 141.1 kg (311 lb 1.1 oz), SpO2 99.00%.    PHYSICAL EXAM General--awake, responsive, still intubated Chest--large airway sounds Heart--no rub Abd--PD cath exit site ok Extr--no edema  Lab Results:   Lab 09/13/11 0500 09/12/11 0500 09/11/11 0500  NA 139 134* 138  K 4.1 4.1 3.9  CL 89* 86* 88*  CO2 34* 35* 38*  BUN 54* 45* 46*  CREATININE 11.47* 8.71* 8.05*  ALB -- -- --  GLUCOSE 123* -- --  CALCIUM 8.5 9.1 11.1*  PHOS 4.8* 4.7* 5.5*    Weight has gone from 154 kg on 21 June to 141.1 kg now  Assessment/Plan: 1. CHF/volume overload-CXR still looks better!  O2 sat 99% on 40% FIO2. Overall pulm status improving including air exchange. D/C IV  Lasix.  Add 4.25 % bags for PD to pull more fluid   2. ESRD - PD normally at home--but looks like he wasn't doing it at home. Will increase to 6 exchanges per day and increase vol to 2.5 L. Add 4.25 + 2.5 % dextrose unless BP drops. Foley cath out 3. Anemia- continue aranesp.Fe/TIBC 24%, ferritin 779 . Hgb 8.4  Today.  Will give IV Fe in addition to aranesp.  CBC in AM  4. Secondary hyperparathyroidism- continue calcitriol and renvela and tums, renavite. Oral meds on hold right now due to intubation. C 8.5, phos 4.8--just right now.  5. HTN/volume- much improved. CXR still looks better. Will use 4.25% and  2.5% dextrose in PD bags 6. Respiratory failure-BP now 134/64. Off pressors, off antibiotics, Extubate per Pulmonary.  Prior hx OSA  7. Hyperkalemia- resolved.  Can HD cath in L IJ come out?     LOS: 6 days   Amir Fick F 09/13/2011,8:57 AM   .labalb

## 2011-09-13 NOTE — Progress Notes (Signed)
Patient: Gary Rivera DOB: 1960-09-29 Date of Admission: 09/07/2011            Pulmonary consult  Date of Consult: 09/13/2011 MD requesting consult:  Rizwan ([facility])  Reason for consult: respiratory failure   PROFILE: 51yo male with hx HTN, chronic renal failure on PD, CHF presented 6/19 to ER with c/o ~20 lbs weight gain and SOB.  He was admitted by hospitalist and seen by renal who rx with further PD and lasix.  On 6/20 he developed worsening resp distress with bilat infiltrates most c/w edema. Failed NPPV and required intubation  SUBJ: Failed SBT. RASS -1. + F/C. Intermittently agitated  Filed Vitals:   09/13/11 1228 09/13/11 1300 09/13/11 1400 09/13/11 1500  BP: 143/83 124/81 117/70 128/74  Pulse: 97 104 93 93  Temp:      TempSrc:      Resp: 37 26 17 19   Height:      Weight:      SpO2: 100% 98% 97% 98%  EXAM: General:  Obese male,intubated Neuro: MAEs, + F/C CV: RRR, distant heart sounds PULM: clear anteriorly, no wheeze GI: obese, round, mildly distended, PD cath Extremities:  Warm and dry, tr  edema  Derm: no rash or skin breakdown   CXR: NSC bilat basilar atx  CBC    Component Value Date/Time   WBC 8.8 09/13/2011 0500   RBC 2.90* 09/13/2011 0500   HGB 8.4* 09/13/2011 0500   HCT 27.3* 09/13/2011 0500   PLT 134* 09/13/2011 0500   MCV 94.1 09/13/2011 0500   MCH 29.0 09/13/2011 0500   MCHC 30.8 09/13/2011 0500   RDW 16.3* 09/13/2011 0500   LYMPHSABS 1.0 09/08/2011 0300   MONOABS 0.3 09/08/2011 0300   EOSABS 0.3 09/08/2011 0300   BASOSABS 0.0 09/08/2011 0300     BMET    Component Value Date/Time   NA 139 09/13/2011 0500   K 4.1 09/13/2011 0500   CL 89* 09/13/2011 0500   CO2 34* 09/13/2011 0500   GLUCOSE 123* 09/13/2011 0500   BUN 54* 09/13/2011 0500   CREATININE 11.47* 09/13/2011 0500   CALCIUM 8.5 09/13/2011 0500   CALCIUM 8.0* 12/04/2009 0455   GFRNONAA 4* 09/13/2011 0500   GFRAA 5* 09/13/2011 0500        ASSESSMENT AND PLAN  PULMONARY  Ventilator Settings:  reviewed  ETT:  6/21 >>   A:  Acute on chronic hypercarbic resp failure -- multifactorial. Resolving pulm edema, probable OSA?OHS    P:   -Cont wean as tolerated -Daily WUA/SBT -Cont aggressive volume removal - see below     CARDIOVASCULAR  ECHO 6/20: LVEF 40-45%, moderate LVH, diffuse HK, no MR noted, severe LA dilation, RV dilation, RVSP est 60 mmHg ProBNP 6/19: 24,858 Cardiac markers 6/21: NEGATIVE  A: Acute on chronic systolic CHF - EF 40-45%  Pulm HTN  P:   Maximize volume removal Metoprolol to maintain HR < 115/min Hydralazine PRN to maintain SBP < 160 mmHg   RENAL   Intake/Output Summary (Last 24 hours) at 09/13/11 1507 Last data filed at 09/13/11 1500  Gross per 24 hour  Intake    946 ml  Output      0 ml  Net    946 ml    Foley:  6/19 >> 6/25  A:  ESRD - on PD with acute volume overload in setting systolic CHF P:   Renal managing  CRRT >PD 6/23     GASTROINTESTINAL A:  No acute issues P:  Cont TFs   HEMATOLOGIC A:  Anemia Thrombocytopenia  P:  No s/s bleeding Likely r/t chronic disease Transfuse for hgb <7 Cont aranesp   INFECTIOUS Cultures: BCx2 6/20 >>ng BC X 2 6/23 >>  Urine 6/20 >> NEG Resp 6/23 >> NOF  Antibiotics: none   A: Intermittent fevers - no obvious source  P:   Monitor off abx PCT 6/26   ENDOCRINE  A:  No acute issues  P:   Monitor glucose on bmet    NEUROLOGIC  A:  AMS/ lethargy - improved. Intermittent agitation P: Judicious fentanyl and versed use on vent    BEST PRACTICE / DISPOSITION Level of Care:  ICU  Primary Service:  PCCM Consultants:  renal Code Status:  full Diet:  TFs 6/24 DVT Px: SCDs GI Px:  none Skin Integrity:  intact Social / Family:  None at bedside      35 mins CCM time   Billy Fischer, MD ; [facility] service Mobile [phone].  After 5:30 PM or weekends, call [phone]

## 2011-09-14 DIAGNOSIS — J383 Other diseases of vocal cords: Secondary | ICD-10-CM | POA: Diagnosis not present

## 2011-09-14 DIAGNOSIS — R061 Stridor: Secondary | ICD-10-CM

## 2011-09-14 LAB — RENAL FUNCTION PANEL
Albumin: 2.8 g/dL — ABNORMAL LOW (ref 3.5–5.2)
BUN: 61 mg/dL — ABNORMAL HIGH (ref 6–23)
Creatinine, Ser: 12.76 mg/dL — ABNORMAL HIGH (ref 0.50–1.35)
Phosphorus: 5 mg/dL — ABNORMAL HIGH (ref 2.3–4.6)

## 2011-09-14 LAB — CBC
MCH: 28.2 pg (ref 26.0–34.0)
MCV: 92.3 fL (ref 78.0–100.0)
Platelets: 145 10*3/uL — ABNORMAL LOW (ref 150–400)
RDW: 16.3 % — ABNORMAL HIGH (ref 11.5–15.5)

## 2011-09-14 LAB — CULTURE, BLOOD (ROUTINE X 2)
Culture  Setup Time: 201306202217
Culture: NO GROWTH

## 2011-09-14 MED ORDER — DEXTROSE 5 % IV SOLN
1.0000 g | INTRAVENOUS | Status: DC
  Administered 2011-09-14: 1 g via INTRAVENOUS
  Filled 2011-09-14 (×2): qty 1

## 2011-09-14 MED ORDER — RACEPINEPHRINE HCL 2.25 % IN NEBU
INHALATION_SOLUTION | RESPIRATORY_TRACT | Status: AC
  Filled 2011-09-14: qty 0.5

## 2011-09-14 MED ORDER — VANCOMYCIN HCL 1000 MG IV SOLR
3000.0000 mg | Freq: Once | INTRAVENOUS | Status: AC
  Administered 2011-09-14: 3000 mg via INTRAVENOUS
  Filled 2011-09-14: qty 3000

## 2011-09-14 MED ORDER — RACEPINEPHRINE HCL 2.25 % IN NEBU
0.5000 mL | INHALATION_SOLUTION | Freq: Once | RESPIRATORY_TRACT | Status: AC
  Administered 2011-09-14: 0.5 mL via RESPIRATORY_TRACT

## 2011-09-14 MED ORDER — METHYLPREDNISOLONE SODIUM SUCC 125 MG IJ SOLR
INTRAMUSCULAR | Status: AC
  Administered 2011-09-14: 80 mg via INTRAVENOUS
  Filled 2011-09-14: qty 2

## 2011-09-14 MED ORDER — FENTANYL CITRATE 0.05 MG/ML IJ SOLN
12.5000 ug | INTRAMUSCULAR | Status: DC | PRN
  Administered 2011-09-15: 25 ug via INTRAVENOUS
  Administered 2011-09-15: 12.5 ug via INTRAVENOUS
  Administered 2011-09-16 (×2): 25 ug via INTRAVENOUS
  Administered 2011-09-16: 12.5 ug via INTRAVENOUS
  Administered 2011-09-17: 25 ug via INTRAVENOUS
  Filled 2011-09-14 (×3): qty 2
  Filled 2011-09-14: qty 4
  Filled 2011-09-14 (×3): qty 2

## 2011-09-14 MED ORDER — METHYLPREDNISOLONE SODIUM SUCC 125 MG IJ SOLR
80.0000 mg | Freq: Once | INTRAMUSCULAR | Status: AC
  Administered 2011-09-14: 80 mg via INTRAVENOUS

## 2011-09-14 MED ORDER — VECURONIUM BROMIDE 10 MG IV SOLR
INTRAVENOUS | Status: AC
  Filled 2011-09-14: qty 10

## 2011-09-14 MED ORDER — ETOMIDATE 2 MG/ML IV SOLN
INTRAVENOUS | Status: AC
  Filled 2011-09-14: qty 10

## 2011-09-14 MED ORDER — LORAZEPAM 2 MG/ML IJ SOLN
INTRAMUSCULAR | Status: AC
  Administered 2011-09-14: 1 mg
  Filled 2011-09-14: qty 1

## 2011-09-14 MED ORDER — MIDAZOLAM HCL 2 MG/2ML IJ SOLN
INTRAMUSCULAR | Status: AC
  Filled 2011-09-14: qty 4

## 2011-09-14 MED ORDER — METHYLPREDNISOLONE SODIUM SUCC 125 MG IJ SOLR
80.0000 mg | Freq: Once | INTRAMUSCULAR | Status: AC
  Administered 2011-09-14: 80 mg via INTRAVENOUS
  Filled 2011-09-14: qty 2

## 2011-09-14 MED ORDER — LORAZEPAM 2 MG/ML IJ SOLN
0.5000 mg | INTRAMUSCULAR | Status: DC | PRN
  Administered 2011-09-14 – 2011-09-15 (×2): 1 mg via INTRAVENOUS
  Administered 2011-09-15: 0.5 mg via INTRAVENOUS
  Administered 2011-09-17 – 2011-09-18 (×2): 1 mg via INTRAVENOUS
  Filled 2011-09-14 (×6): qty 1

## 2011-09-14 NOTE — Procedures (Signed)
Extubation Procedure Note  Patient Details:   Name: Gary Rivera DOB: 03-Apr-1960 MRN: [id number]   Airway Documentation:     Evaluation  O2 sats: stable throughout Complications: No apparent complications Patient did not tolerate procedure well. Bilateral Breath Sounds: Rhonchi Suctioning: Airway No Pt. Had cuff leak prior to extubation, Bipap placed per MD. Recemic epi. Given. Cont to monitor.  Newt Lukes 09/14/2011, 10:04 AM

## 2011-09-14 NOTE — Progress Notes (Signed)
ANTIBIOTIC CONSULT NOTE - INITIAL  Pharmacy Consult for  Vancomycin and Cefepime Indication:  Pneumonia  Allergies  Allergen Reactions  . Prednisone Nausea Only   Patient Measurements: Height: 6\' 2"  (188 cm) Weight: 301 lb 2.4 oz (136.6 kg) IBW/kg (Calculated) : 82.2  Adjusted Body Weight: 104  Vital Signs: Temp: 99.7 F (37.6 C) (06/26 0754) Temp src: Oral (06/26 0754) BP: 169/87 mmHg (06/26 1100) Pulse Rate: 102  (06/26 1100) Intake/Output from previous day: 06/25 0701 - 06/26 0700 In: 910 [I.V.:230; NG/GT:630; IV Piggyback:50] Out: -  Intake/Output from this shift: Total I/O In: 60 [I.V.:40; NG/GT:20] Out: -   Labs:  Basename 09/14/11 0448 09/13/11 0500 09/12/11 0500  WBC 9.0 8.8 6.6  HGB 8.0* 8.4* 8.2*  PLT 145* 134* 129*  LABCREA -- -- --  CREATININE 12.76* 11.47* 8.71*   Estimated Creatinine Clearance: 10 ml/min (by C-G formula based on Cr of 12.76).  Microbiology: Recent Results (from the past 720 hour(s))  MRSA PCR SCREENING     Status: Normal   Collection Time   09/08/11  2:24 AM      Component Value Range Status Comment   MRSA by PCR NEGATIVE  NEGATIVE Final   CULTURE, BLOOD (ROUTINE X 2)     Status: Normal   Collection Time   09/08/11 10:40 AM      Component Value Range Status Comment   Specimen Description BLOOD RIGHT FOREARM   Final    Special Requests BOTTLES DRAWN AEROBIC AND ANAEROBIC [facility]   Final    Culture  Setup Time [id number]   Final    Culture NO GROWTH 5 DAYS   Final    Report Status 09/14/2011 FINAL   Final   CULTURE, BLOOD (ROUTINE X 2)     Status: Normal   Collection Time   09/08/11 10:50 AM      Component Value Range Status Comment   Specimen Description BLOOD RIGHT HAND   Final    Special Requests BOTTLES DRAWN AEROBIC ONLY 5CC   Final    Culture  Setup Time [id number]   Final    Culture NO GROWTH 5 DAYS   Final    Report Status 09/14/2011 FINAL   Final   URINE CULTURE     Status: Normal   Collection Time   09/08/11  10:19 PM      Component Value Range Status Comment   Specimen Description URINE, CLEAN CATCH   Final    Special Requests Normal   Final    Culture  Setup Time [id number]   Final    Colony Count NO GROWTH   Final    Culture NO GROWTH   Final    Report Status 09/09/2011 FINAL   Final   CULTURE, RESPIRATORY     Status: Normal   Collection Time   09/11/11 12:22 PM      Component Value Range Status Comment   Specimen Description TRACHEAL ASPIRATE   Final    Special Requests NONE   Final    Gram Stain     Final    Value: FEW WBC PRESENT,BOTH PMN AND MONONUCLEAR     RARE SQUAMOUS EPITHELIAL CELLS PRESENT     RARE GRAM POSITIVE COCCI IN PAIRS     RARE GRAM NEGATIVE RODS   Culture Non-Pathogenic Oropharyngeal-type Flora Isolated.   Final    Report Status 09/13/2011 FINAL   Final    Medical History: Past Medical History  Diagnosis Date  . Hypertension   .  Renal disorder   . CHF (congestive heart failure)   . Peritoneal dialysis catheter in place   . Anemia     patient reporats that last hgb 7.9 a week ago   Medications:  No current antibiotics  Assessment: 51 yo male, morbidly obese, with a history of hypertension, ESRD and congestive heart failure.  He is on peritoneal dialysis with 4 exchanges daily.   He has had a 20lb weight gain recently and has developed shortness of breath.  His procalcitonin is elevated at 5.4 and there is some question as to pneumonia.  He will be placed on broad spectrum antibiotics with Vancomycin and Cefepime.  Goal of Therapy:  Vancomycin peak level 30-40 mcg/ml  Plan:   Vancomycin 3 gm. IV x 1 - Will use adjusted body weight due to morbid obesity and dose using the following:           LD = Cssp(desired) x Vd x ABW                                        LD = 30mg /L x 0.9L x 104kg = ~2808mg  or 3gm.  Cefepime 1 gm. IV every 24 hours  Will plan to check a level in a few days and redose as needed.  Nadara Mustard, PharmD., MS Clinical  Pharmacist Pager:  [phone]  Thank you for allowing pharmacy to be part of this patients care team.

## 2011-09-14 NOTE — Progress Notes (Signed)
Patient: Gary Rivera DOB: 06/21/1960 Date of Admission: 09/07/2011            Pulmonary consult  Date of Consult: 09/14/2011 MD requesting consult:  Rizwan ([facility])  Reason for consult: respiratory failure   PROFILE: 51yo male with hx HTN, chronic renal failure on PD, CHF presented 6/19 to ER with c/o ~20 lbs weight gain and SOB.  He was admitted by hospitalist and seen by renal who rx with further PD and lasix.  On 6/20 he developed worsening resp distress with bilat infiltrates most c/w edema. Failed NPPV and required intubation  SUBJ: Passed SBT. RASS 0. + F/C. Extubated. Developed significant stridor shortly after extubation. Improved with racemic epi, steroids but still requiring NPPV  Filed Vitals:   09/14/11 1200 09/14/11 1237 09/14/11 1300 09/14/11 1400  BP: 111/64  132/87 136/90  Pulse: 99 95 94 95  Temp:      TempSrc:      Resp: 31  30 31   Height:      Weight:      SpO2: 98% 99% 99% 98%  EXAM: General:  Obese male, audible stridor Neuro: MAEs, + F/C CV: RRR, distant heart sounds PULM: clear anteriorly, no wheeze GI: obese, round, mildly distended, PD cath Extremities:  Warm and dry, tr  edema  Derm: no rash or skin breakdown   CXR: No new film  CBC    Component Value Date/Time   WBC 9.0 09/14/2011 0448   RBC 2.84* 09/14/2011 0448   HGB 8.0* 09/14/2011 0448   HCT 26.2* 09/14/2011 0448   PLT 145* 09/14/2011 0448   MCV 92.3 09/14/2011 0448   MCH 28.2 09/14/2011 0448   MCHC 30.5 09/14/2011 0448   RDW 16.3* 09/14/2011 0448   LYMPHSABS 1.0 09/08/2011 0300   MONOABS 0.3 09/08/2011 0300   EOSABS 0.3 09/08/2011 0300   BASOSABS 0.0 09/08/2011 0300     BMET    Component Value Date/Time   NA 139 09/14/2011 0448   K 3.8 09/14/2011 0448   CL 90* 09/14/2011 0448   CO2 32 09/14/2011 0448   GLUCOSE 123* 09/14/2011 0448   BUN 61* 09/14/2011 0448   CREATININE 12.76* 09/14/2011 0448   CALCIUM 8.1* 09/14/2011 0448   CALCIUM 8.0* 12/04/2009 0455   GFRNONAA 4* 09/14/2011 0448   GFRAA  5* 09/14/2011 0448    ASSESSMENT AND PLAN  PULMONARY  Ventilator Settings:   ETT:  6/21 >> 6/26  A:  Acute on chronic hypercarbic resp failure -- multifactorial. Resolving pulm edema, probable OSA?OHS  Post extubation stridor (6/26) - somewhat improved after reacemic epi and steroids. Requiring BiPAP after ext 6/26   P:   -Cont PRN NPPV -Will give a second dose of solu-medrol -Cont aggressive volume removal - see below     CARDIOVASCULAR  ECHO 6/20: LVEF 40-45%, moderate LVH, diffuse HK, no MR noted, severe LA dilation, RV dilation, RVSP est 60 mmHg ProBNP 6/19: 24,858 Cardiac markers 6/21: NEGATIVE  A: Acute on chronic systolic CHF - EF 40-45%  Pulm HTN  P:   Maximize volume removal Metoprolol to maintain HR < 115/min Hydralazine PRN to maintain SBP < 160 mmHg   RENAL   Intake/Output Summary (Last 24 hours) at 09/14/11 1502 Last data filed at 09/14/11 1400  Gross per 24 hour  Intake    680 ml  Output      0 ml  Net    680 ml    Foley:  6/19 >> 6/25  A:  ESRD - on continuous PD with acute volume overload in setting systolic CHF P:   Renal managing  CRRT > continuous PD 6/23     GASTROINTESTINAL A:  No acute issues P:   NPO after extubation until stridor and risk of re-intubation have resolved   HEMATOLOGIC A:  Anemia Thrombocytopenia, mild, improving P:  No s/s bleeding Transfuse for hgb <7 Cont aranesp   INFECTIOUS Cultures: BCx2 6/20 >>ng BC X 2 6/23 >>  Urine 6/20 >> NEG Resp 6/23 >> NOF Blood 6/26 >>  Resp 6/26 >>  Antibiotics:  Vanc 6/26 >>  Cefepime 6/26 >>   A: Intermittent fevers- possible PNA P:   Monitor off abx PCT 6/26 Recheck CXR AM 6/27   ENDOCRINE  A:  No acute issues  P:   Monitor glucose on bmet    NEUROLOGIC  A:  AMS/ lethargy - resolved Intermittent agitation, chronic use of clonazepam P: PRN Ativan    BEST PRACTICE / DISPOSITION Level of Care:  ICU  Primary Service:  PCCM Consultants:   renal Code Status:  full Diet: NPO DVT Px: SCDs GI Px:  none Social / Family:  None at bedside      45 mins CCM time   Billy Fischer, MD ; [facility] service Mobile [phone].  After 5:30 PM or weekends, call [phone]

## 2011-09-14 NOTE — Progress Notes (Signed)
[facility]  Subjective:  Eyes open, responds appropriately to questions   Objective: Vital signs in last 24 hours: Blood pressure 92/68, pulse 98, temperature 99.7 F (37.6 C), temperature source Oral, resp. rate 18, height 6\' 2"  (1.88 m), weight 136.6 kg (301 lb 2.4 oz), SpO2 97.00%.  Peritoneal dialysis:  6 exchanges/day.  2.5 L per exchange.  Alternating 2.5%/4.25% dextrose bags Weight:  154 kg on 21 Jum     140 kg on 24 Jun     136.6 kg today    PHYSICAL EXAM General--awake, alert, intubated Chest--large airway sounds Heart--tachy, no rub Abd--PD cath in L rectus, nontender Extr--no edema  Lab Results:   Lab 09/14/11 0448 09/13/11 0500 09/12/11 0500  NA 139 139 134*  K 3.8 4.1 4.1  CL 90* 89* 86*  CO2 32 34* 35*  BUN 61* 54* 45*  CREATININE 12.76* 11.47* 8.71*  ALB -- -- --  GLUCOSE 123* -- --  CALCIUM 8.1* 8.5 9.1  PHOS 5.0* 4.8* 4.7*     Basename 09/14/11 0448 09/13/11 0500  WBC 9.0 8.8  HGB 8.0* 8.4*  HCT 26.2* 27.3*  PLT 145* 134*    Assessment/Plan: 1. CHF/volume overload-CXR still looks better! O2 sat 99% on 40% FIO2. Overall pulm status improving including air exchange. D/C IV Lasix. Continue 2.5%/4.25 % bags for PD to pull more fluid  2. ESRD - PD usually at home--but looks like he wasn't doing it at home. Will continue  6 exchanges per day and continue fill voumel at 2.5 L. Add 4.25%/2.5 % dextrose unless BP drops. Foley cath out  3. Anemia- continue aranesp.Fe/TIBC 24%, ferritin 779 . Hgb 8.0 Today. Will give IV Fe in addition to aranesp. CBC in AM  4. Secondary hyperparathyroidism- continue calcitriol and renvela and tums, renavite. Oral meds on hold right now due to intubation. C 8.5, phos 5.0--just right now.  5. HTN/volume- much improved. CXR still looks better. Will use 4.25% and 2.5% dextrose in PD bags  6. Respiratory failure-BP now92/68. Off pressors, off antibiotics, Extubate per Pulmonary. Prior hx OSA  7. Hyperkalemia-  resolved.   LOS: 7 days   Wana Mount F 09/14/2011,8:59 AM   .labalb

## 2011-09-14 NOTE — Progress Notes (Signed)
Late entry:  Patient extubated. RN RT at bedside. Patient was able to pull good lung volumes >1L and fully able to follow commands and participate in care. After extubation patient experienced refractory vocal cord edema. 80 mg IV solumedrol given. Bipap initiated per RT.

## 2011-09-14 NOTE — Progress Notes (Signed)
Nutrition Follow-up  Patient was extubated this morning, however, has a tenuous respiratory status and may need re-intubation, per RN.  TF was initiated yesterday (Nepro at 20 ml/h with goal rate of 40 ml/h), but is currently off due to extubation.  CVVHD was discontinued; patient is now receiving peritoneal dialysis.  Diet Order:  NPO  Meds: Scheduled Meds:   . antiseptic oral rinse  15 mL Mouth Rinse QID  . aspirin  325 mg Oral Daily  . calcitRIOL  0.25 mcg Oral Custom  . calcium carbonate  1 tablet Oral TID WC  . chlorhexidine  15 mL Mouth Rinse BID  . darbepoetin (ARANESP) injection - DIALYSIS  150 mcg Subcutaneous Q Thu-HD  . dialysis solution for CAPD/CCPD [facility])   Peritoneal Dialysis Daily  . etomidate      . famotidine  20 mg Per Tube Daily  . feeding supplement (NEPRO CARB STEADY)  1,000 mL Per Tube Q24H  . fentaNYL  25 mcg Transdermal Q72H  . ferric gluconate (FERRLECIT/NULECIT) IV  125 mg Intravenous Q T,Th,Sa-HD  . ferric gluconate (FERRLECIT/NULECIT) Test Dose  25 mg Intravenous Once  . LORazepam      . methylPREDNISolone (SOLU-MEDROL) injection  80 mg Intravenous Once  . midazolam      . multivitamin  1 tablet Oral Daily  . Racepinephrine HCl  0.5 mL Nebulization Once  . Racepinephrine HCl      . sevelamer  800 mg Oral TID WC  . sodium chloride  3 mL Intravenous Q12H  . vecuronium      . DISCONTD: clonazePAM  0.5 mg Oral BID  . DISCONTD: clonazePAM  0.5 mg Oral BID  . DISCONTD: feeding supplement (NEPRO CARB STEADY)  1,000 mL Per Tube Q24H   Continuous Infusions:   . dialysis solution 2.5% low-MG/low-CA    . dialysis solution 4.25% low-MG/low-CA    . heparin    . DISCONTD: heparin     PRN Meds:.sodium chloride, acetaminophen, fentaNYL, hydrALAZINE, metoprolol, ondansetron (ZOFRAN) IV, sevelamer, sodium chloride, sodium chloride, DISCONTD: fentaNYL, DISCONTD: fentaNYL, DISCONTD: midazolam, DISCONTD: midazolam  Labs:  CMP     Component Value Date/Time   NA  139 09/14/2011 0448   K 3.8 09/14/2011 0448   CL 90* 09/14/2011 0448   CO2 32 09/14/2011 0448   GLUCOSE 123* 09/14/2011 0448   BUN 61* 09/14/2011 0448   CREATININE 12.76* 09/14/2011 0448   CALCIUM 8.1* 09/14/2011 0448   CALCIUM 8.0* 12/04/2009 0455   PROT 6.5 09/08/2011 1343   ALBUMIN 2.8* 09/14/2011 0448   AST 29 09/08/2011 1343   ALT 26 09/08/2011 1343   ALKPHOS 48 09/08/2011 1343   BILITOT 0.3 09/08/2011 1343   GFRNONAA 4* 09/14/2011 0448   GFRAA 5* 09/14/2011 0448   Phosphorus  Date/Time Value Range Status  09/14/2011  4:48 AM 5.0* 2.3 - 4.6 mg/dL Final  1/61/0960  4:54 AM 4.8* 2.3 - 4.6 mg/dL Final  0/98/1191  4:78 AM 4.7* 2.3 - 4.6 mg/dL Final   Potassium  Date/Time Value Range Status  09/14/2011  4:48 AM 3.8  3.5 - 5.1 mEq/L Final  09/13/2011  5:00 AM 4.1  3.5 - 5.1 mEq/L Final  09/12/2011  5:00 AM 4.1  3.5 - 5.1 mEq/L Final     Intake/Output Summary (Last 24 hours) at 09/14/11 1025 Last data filed at 09/14/11 0900  Gross per 24 hour  Intake    770 ml  Output      0 ml  Net  770 ml    Phosphorus remains slightly elevated.  Potassium is WNL.  Weight Status:  136.6 kg down from 154.2 kg last week with fluid removal from CVVHD.    Dialysate from peritoneal dialysis is contributing around 1125 kcals/day.  Re-estimated needs (using standard body weight of ~92 kg):  2600-2700 kcals, 120-140 grams protein daily  Nutrition Dx:  Inadequate oral intake, ongoing.  Goal:  Enteral nutrition to provide 60-70% of estimated calorie needs (22-25 kcals/kg ideal body weight) and 100% of estimated protein needs, based on ASPEN guidelines for permissive underfeeding in critically ill obese individuals, no longer applicable since patient has been extubated.  New goal:  Intake to meet 90-100% of estimated nutrition needs, unmet.  Intervention:    Recommend advance diet to Regular as able pending respiratory status.  As intake improves, monitor phosphorus and potassium and limit in diet as  needed.  If unable to advance diet, recommend TF with Nepro at 30 ml/h with Pro-stat 30 ml 5 times daily to provide 1850 kcals, 133 grams protein, 523 ml free water daily.  Total intake with dialysate and TF will be ~2900 kcals/d.   Monitor:  Diet advancement, PO intake, labs, renal function, weight trend.   Joaquin Courts, RD, CNSC, LDN Pager# [phone] After Hours Pager# [phone]

## 2011-09-15 ENCOUNTER — Inpatient Hospital Stay (HOSPITAL_COMMUNITY): Payer: Medicare Other

## 2011-09-15 LAB — RENAL FUNCTION PANEL
Albumin: 3.1 g/dL — ABNORMAL LOW (ref 3.5–5.2)
BUN: 76 mg/dL — ABNORMAL HIGH (ref 6–23)
GFR calc Af Amer: 4 mL/min — ABNORMAL LOW (ref >90)
GFR calc non Af Amer: 3 mL/min — ABNORMAL LOW (ref >90)
Phosphorus: 8.6 mg/dL — ABNORMAL HIGH (ref 2.3–4.6)
Potassium: 4.7 mEq/L (ref 3.5–5.1)
Sodium: 143 mEq/L (ref 135–145)

## 2011-09-15 LAB — CBC
MCHC: 30.6 g/dL (ref 30.0–36.0)
Platelets: 218 10*3/uL (ref 150–400)
RDW: 16.2 % — ABNORMAL HIGH (ref 11.5–15.5)

## 2011-09-15 LAB — MAGNESIUM: Magnesium: 2.5 mg/dL (ref 1.5–2.5)

## 2011-09-15 MED ORDER — ALBUTEROL SULFATE (5 MG/ML) 0.5% IN NEBU
INHALATION_SOLUTION | RESPIRATORY_TRACT | Status: AC
  Administered 2011-09-15: 2.5 mg via RESPIRATORY_TRACT
  Filled 2011-09-15: qty 0.5

## 2011-09-15 MED ORDER — ALBUTEROL SULFATE (5 MG/ML) 0.5% IN NEBU
2.5000 mg | INHALATION_SOLUTION | RESPIRATORY_TRACT | Status: DC | PRN
  Administered 2011-09-15 – 2011-09-23 (×10): 2.5 mg via RESPIRATORY_TRACT
  Filled 2011-09-15 (×10): qty 0.5

## 2011-09-15 MED ORDER — ALBUTEROL SULFATE (5 MG/ML) 0.5% IN NEBU
2.5000 mg | INHALATION_SOLUTION | Freq: Four times a day (QID) | RESPIRATORY_TRACT | Status: DC
  Administered 2011-09-15 – 2011-09-19 (×17): 2.5 mg via RESPIRATORY_TRACT
  Filled 2011-09-15 (×17): qty 0.5

## 2011-09-15 MED ORDER — BISACODYL 10 MG RE SUPP
10.0000 mg | Freq: Every day | RECTAL | Status: DC | PRN
  Filled 2011-09-15: qty 1

## 2011-09-15 MED ORDER — SORBITOL 70 % SOLN: 30.0000 mL | Freq: Two times a day (BID) | Status: DC

## 2011-09-15 MED ORDER — DEXTROSE 5 % IV SOLN
2.0000 g | INTRAVENOUS | Status: DC
  Administered 2011-09-15 – 2011-09-17 (×2): 2 g via INTRAVENOUS
  Filled 2011-09-15 (×3): qty 2

## 2011-09-15 MED ORDER — IPRATROPIUM BROMIDE 0.02 % IN SOLN
0.5000 mg | Freq: Four times a day (QID) | RESPIRATORY_TRACT | Status: DC
  Administered 2011-09-15 – 2011-09-19 (×17): 0.5 mg via RESPIRATORY_TRACT
  Filled 2011-09-15 (×17): qty 2.5

## 2011-09-15 MED ORDER — PHENOL 1.4 % MT LIQD
2.0000 | OROMUCOSAL | Status: DC | PRN
  Administered 2011-09-15: 2 via OROMUCOSAL
  Filled 2011-09-15: qty 177

## 2011-09-15 MED ORDER — ALBUTEROL SULFATE (5 MG/ML) 0.5% IN NEBU
INHALATION_SOLUTION | RESPIRATORY_TRACT | Status: AC
  Administered 2011-09-15: 2.5 mg
  Filled 2011-09-15: qty 0.5

## 2011-09-15 NOTE — Progress Notes (Signed)
ANTIBIOTIC CONSULT NOTE - INITIAL  Pharmacy Consult for  Vancomycin and Cefepime Indication:  Pneumonia  Allergies  Allergen Reactions  . Prednisone Nausea Only   Patient Measurements: Height: 6\' 2"  (188 cm) Weight: 294 lb 15.6 oz (133.8 kg) IBW/kg (Calculated) : 82.2  Adjusted Body Weight: 104  Vital Signs: Temp: 98.5 F (36.9 C) (06/27 1151) Temp src: Oral (06/27 1151) BP: 90/46 mmHg (06/27 1000) Pulse Rate: 89  (06/27 1022) Intake/Output from previous day: 06/26 0701 - 06/27 0700 In: 760 [I.V.:190; NG/GT:20; IV Piggyback:550] Out: -  Intake/Output from this shift: Total I/O In: 30 [I.V.:30] Out: -   Labs:  Basename 09/15/11 0500 09/14/11 0448 09/13/11 0500  WBC 12.7* 9.0 8.8  HGB 9.1* 8.0* 8.4*  PLT 218 145* 134*  LABCREA -- -- --  CREATININE 14.99* 12.76* 11.47*   Estimated Creatinine Clearance: 8.4 ml/min (by C-G formula based on Cr of 14.99).  Microbiology: Recent Results (from the past 720 hour(s))  MRSA PCR SCREENING     Status: Normal   Collection Time   09/08/11  2:24 AM      Component Value Range Status Comment   MRSA by PCR NEGATIVE  NEGATIVE Final   CULTURE, BLOOD (ROUTINE X 2)     Status: Normal   Collection Time   09/08/11 10:40 AM      Component Value Range Status Comment   Specimen Description BLOOD RIGHT FOREARM   Final    Special Requests BOTTLES DRAWN AEROBIC AND ANAEROBIC [facility]   Final    Culture  Setup Time [id number]   Final    Culture NO GROWTH 5 DAYS   Final    Report Status 09/14/2011 FINAL   Final   CULTURE, BLOOD (ROUTINE X 2)     Status: Normal   Collection Time   09/08/11 10:50 AM      Component Value Range Status Comment   Specimen Description BLOOD RIGHT HAND   Final    Special Requests BOTTLES DRAWN AEROBIC ONLY 5CC   Final    Culture  Setup Time [id number]   Final    Culture NO GROWTH 5 DAYS   Final    Report Status 09/14/2011 FINAL   Final   URINE CULTURE     Status: Normal   Collection Time   09/08/11 10:19 PM       Component Value Range Status Comment   Specimen Description URINE, CLEAN CATCH   Final    Special Requests Normal   Final    Culture  Setup Time [id number]   Final    Colony Count NO GROWTH   Final    Culture NO GROWTH   Final    Report Status 09/09/2011 FINAL   Final   CULTURE, RESPIRATORY     Status: Normal   Collection Time   09/11/11 12:22 PM      Component Value Range Status Comment   Specimen Description TRACHEAL ASPIRATE   Final    Special Requests NONE   Final    Gram Stain     Final    Value: FEW WBC PRESENT,BOTH PMN AND MONONUCLEAR     RARE SQUAMOUS EPITHELIAL CELLS PRESENT     RARE GRAM POSITIVE COCCI IN PAIRS     RARE GRAM NEGATIVE RODS   Culture Non-Pathogenic Oropharyngeal-type Flora Isolated.   Final    Report Status 09/13/2011 FINAL   Final   CULTURE, RESPIRATORY     Status: Normal (Preliminary result)   Collection Time  09/14/11  9:42 AM      Component Value Range Status Comment   Specimen Description TRACHEAL ASPIRATE   Final    Special Requests NONE   Final    Gram Stain     Final    Value: MODERATE WBC PRESENT,BOTH PMN AND MONONUCLEAR     NO SQUAMOUS EPITHELIAL CELLS SEEN     FEW GRAM NEGATIVE COCCOBACILLI   Culture Culture reincubated for better growth   Final    Report Status PENDING   Incomplete   CULTURE, BLOOD (ROUTINE X 2)     Status: Normal (Preliminary result)   Collection Time   09/14/11 10:00 AM      Component Value Range Status Comment   Specimen Description BLOOD ARM LEFT   Final    Special Requests BOTTLES DRAWN AEROBIC AND ANAEROBIC 10CC   Final    Culture  Setup Time [id number]   Final    Culture     Final    Value:        BLOOD CULTURE RECEIVED NO GROWTH TO DATE CULTURE WILL BE HELD FOR 5 DAYS BEFORE ISSUING A FINAL NEGATIVE REPORT   Report Status PENDING   Incomplete   CULTURE, BLOOD (ROUTINE X 2)     Status: Normal (Preliminary result)   Collection Time   09/14/11 10:07 AM      Component Value Range Status Comment   Specimen  Description BLOOD ARM RIGHT   Final    Special Requests BOTTLES DRAWN AEROBIC AND ANAEROBIC 10CC   Final    Culture  Setup Time [id number]   Final    Culture     Final    Value:        BLOOD CULTURE RECEIVED NO GROWTH TO DATE CULTURE WILL BE HELD FOR 5 DAYS BEFORE ISSUING A FINAL NEGATIVE REPORT   Report Status PENDING   Incomplete    Medical History: Past Medical History  Diagnosis Date  . Hypertension   . Renal disorder   . CHF (congestive heart failure)   . Peritoneal dialysis catheter in place   . Anemia     patient reporats that last hgb 7.9 a week ago   Medications:  No current antibiotics  Assessment: 51 yo male, morbidly obese, with a history of hypertension, ESRD and congestive heart failure.  He is on peritoneal dialysis with 6 exchanges daily.   His procalcitonin is elevated at 5.4 and there is some question as to pneumonia on day # 2 of Vancomycin and Zosyn.   WBC up at 12.7, Tmax 100.  Micro: 6/26 Resp - (gm stain with GN coccobacilli) 6/23 Resp - NPF 6/26 Blood - ngtd 6/20 Blood- negative  Goal of Therapy:  Vancomycin peak level 30-40 mcg/ml  Plan: Adjust Cefepime to 2g IV q48h for PD. Plan for vancomycin level in a few days and redose as needed.   Link Snuffer, PharmD, BCPS Clinical Pharmacist [phone] 09/15/2011, 1:55 PM  Thank you for allowing pharmacy to be part of this patients care team.

## 2011-09-15 NOTE — Clinical Social Work Psychosocial (Signed)
     Clinical Social Work Department BRIEF PSYCHOSOCIAL ASSESSMENT 09/15/2011  Patient:  CHESKY, HEYER     Account Number:  [id number]     Admit date:  09/07/2011  Clinical Social Worker:  Margaree Mackintosh  Date/Time:  09/15/2011 01:20 PM  Referred by:  RN  Date Referred:  09/14/2011 Referred for  Other - See comment   Other Referral:   Interview type:  Patient Other interview type:   with sister present.    PSYCHOSOCIAL DATA Living Status:   Admitted from facility:   Level of care:   Primary support name:  [phone] Primary support relationship to patient:  SIBLING Degree of support available:   Adequate.    CURRENT CONCERNS Current Concerns  Post-Acute Placement   Other Concerns:    SOCIAL WORK ASSESSMENT / PLAN Clinical Social Worker recieved referral for "other psychosocial needs".  CSW reviewed chart and met with pt & sister at bedside.  CSW reviewed PT recommendations: home health PT.  Pt and sister stated they currently recieve home health services through a company, but are not sure the name.  Sister stated she will look for the information at home and inform staff tomorrow.  CSW reviewed home health pt process.  CSW updated RNCM-Sarah.  CSW to continue to follow and assist as needed.   Assessment/plan status:  Information/Referral to [facility] Other assessment/ plan:   Information/referral to community resources:   Home Health.    PATIENTS/FAMILYS RESPONSE TO PLAN OF CARE: Pt and sister were pleasant and appropriately engaged.  Pt and sister spoke openly and thanked CSW for intervention.

## 2011-09-15 NOTE — Evaluation (Signed)
Physical Therapy Evaluation Patient Details Name: Gary Rivera MRN: [id number] DOB: 1960-06-02 Today's Date: 09/15/2011 Time: 4098-1191 PT Time Calculation (min): 26 min  PT Assessment / Plan / Recommendation Clinical Impression  Pt admitted with renal failure and fluid retention leading to respiratory failure and pt ventilated 6/21-6/26. Pt very pleasant and eager to return home from current hospitalization. Pt will benefit from acute therapy to maximize tranfers gait and independence to return pt to PLOF and decrease burden of care at discharge.     PT Assessment  Patient needs continued PT services    Follow Up Recommendations  Home health PT;Supervision/Assistance - 24 hour    Barriers to Discharge None      Equipment Recommendations  None recommended by PT    Recommendations for Other Services OT consult   Frequency Min 3X/week    Precautions / Restrictions Precautions Precautions: Fall Precaution Comments: oxygen, peritoneal dialysis   Pertinent Vitals/Pain Pt on 6L [place] with ambulation sats 94% No pain      Mobility  Bed Mobility Bed Mobility: Supine to Sit Supine to Sit: 5: Supervision;With rails;HOB elevated (HOb 15degrees) Details for Bed Mobility Assistance: supervision for lines Transfers Transfers: Sit to Stand;Stand to Sit Sit to Stand: 4: Min assist;From bed Stand to Sit: 4: Min guard;To chair/3-in-1;To bed Details for Transfer Assistance: cueing for hand placement and safety. Pt stood 30sec then needed to sit before second stand and ambulation  Ambulation/Gait Ambulation/Gait Assistance: 4: Min assist Ambulation Distance (Feet): 45 Feet Assistive device: Rolling walker Ambulation/Gait Assistance Details: cueing to step into RW and for posture as well as safety, +1 to follow with chair and for lines Gait Pattern: Step-through pattern;Decreased stride length Gait velocity: decreased Stairs: No    Exercises     PT Diagnosis: Abnormality of  gait;Difficulty walking  PT Problem List: Decreased strength;Decreased activity tolerance;Decreased knowledge of use of DME PT Treatment Interventions: Gait training;Stair training;DME instruction;Functional mobility training;Therapeutic activities;Therapeutic exercise;Patient/family education   PT Goals Acute Rehab PT Goals PT Goal Formulation: With patient Time For Goal Achievement: 09/29/11 Potential to Achieve Goals: Good Pt will go Supine/Side to Sit: with modified independence PT Goal: Supine/Side to Sit - Progress: Goal set today Pt will go Sit to Stand: with modified independence PT Goal: Sit to Stand - Progress: Goal set today Pt will go Stand to Sit: with modified independence PT Goal: Stand to Sit - Progress: Goal set today Pt will Ambulate: >150 feet;with supervision;with least restrictive assistive device PT Goal: Ambulate - Progress: Goal set today Pt will Go Up / Down Stairs: 1-2 stairs;with least restrictive assistive device;with min assist PT Goal: Up/Down Stairs - Progress: Goal set today  Visit Information  Last PT Received On: 09/15/11 Assistance Needed: +2    Subjective Data  Subjective: i was going to try to walk back Patient Stated Goal: play tennis again- haven't played in 2months   Prior Functioning  Home Living Lives With: Alone Available Help at Discharge: Available 24 hours/day;Family;Personal care attendant (CNA currently 4hrs a day) Type of Home: House Home Access: Stairs to enter [employer] of Steps: 2 Entrance Stairs-Rails: None Home Layout: One level Bathroom Shower/Tub: Engineer, manufacturing systems: Standard Home Adaptive Equipment: Walker - rolling Prior Function Level of Independence: Independent Able to Take Stairs?: Yes Driving: Yes Communication Communication: No difficulties    Cognition  Overall Cognitive Status: Appears within functional limits for tasks assessed/performed Arousal/Alertness:  Awake/alert Orientation Level: Appears intact for tasks assessed Behavior During Session: [facility] for  tasks performed    Extremity/Trunk Assessment Right Lower Extremity Assessment RLE ROM/Strength/Tone: [facility] for tasks assessed Left Lower Extremity Assessment LLE ROM/Strength/Tone: [facility] for tasks assessed   Balance Static Sitting Balance Static Sitting - Balance Support: Feet supported;No upper extremity supported Static Sitting - Level of Assistance: 6: Modified independent (Device/Increase time) Static Sitting - Comment/# of Minutes: 3  End of Session PT - End of Session Equipment Utilized During Treatment: Gait belt Activity Tolerance: Patient tolerated treatment well Patient left: in chair;with call bell/phone within reach;with family/visitor present Nurse Communication: Mobility status  GP     Delorse Lek 09/15/2011, 12:44 PM  Delaney Meigs, PT [phone]

## 2011-09-15 NOTE — Progress Notes (Signed)
Pt continues to have audible stridor. MD ordered breathing treatments.Confirmed its ok to attempt PO meds and start diet.

## 2011-09-15 NOTE — Progress Notes (Signed)
Found pt with central line pulled out of right IJ. Catheter tip intact. Placed pressure dressing on site. No ectopy in EKG rhythm. No worsened SOB. PIV placed. Pt stable. MD notified.

## 2011-09-15 NOTE — Progress Notes (Signed)
[facility]  Subjective:  Extubated, on BiPap.  Awake, writing notes on pad.  Drainage from PD cath very slow   Objective: Vital signs in last 24 hours: Blood pressure 104/64, pulse 87, temperature 97.8 F (36.6 C), temperature source Oral, resp. rate 26, height 6\' 2"  (1.88 m), weight 133.8 kg (294 lb 15.6 oz), SpO2 96.00%.    PHYSICAL EXAM General--awake on BiPap Chest--clear Heart--no rub Abd--PD cath exit site ok, protuberant Extr--no edema  Weight:  154 kg on 21 Jun 140 kg on 24 Jun  136.6 kg on 26 Jun 133.8 kg 27 Jun  Lab Results:   Lab 09/15/11 0500 09/14/11 0448 09/13/11 0500  NA 143 139 139  K 4.7 3.8 4.1  CL 91* 90* 89*  CO2 30 32 34*  BUN 76* 61* 54*  CREATININE 14.99* 12.76* 11.47*  ALB -- -- --  GLUCOSE 132* -- --  CALCIUM 8.7 8.1* 8.5  PHOS 8.6* 5.0* 4.8*     Basename 09/15/11 0500 09/14/11 0448  WBC 12.7* 9.0  HGB 9.1* 8.0*  HCT 29.7* 26.2*  PLT 218 145*   Assessment/Plan:  1. CHF/volume overload-CXR still looks better! O2 sat 99% on 40% FIO2. Now extubated on BiPap.  D/C IV Lasix.  2.5% dextrose PD fluid now with clearer CXR and lower BP 2. ESRD - PD when at home--but looks like he wasn't doing it at home. Will continue 6 exchanges per day and continue fill volume at 2.5 L. 2.5 % dextrose for now. Foley cath out  3. Anemia- continue aranesp.Fe/TIBC 24%, ferritin 779 . Hgb 9.1 Today. Getting IV Fe in addition to aranesp.   4. Secondary hyperparathyroidism- continue calcitrioland renvela plus renavite . C 8.5, phos 8.6--binders held while intubated--will resume  5. HTN/volume- much improved. CXR still looks better. Will use 4.25% and 2.5% dextrose in PD bags  6. Respiratory failure-BP now92/68. Off pressors, off antibiotics, Extubate per Pulmonary. Prior hx OSA  7. Hyperkalemia- resolved.   LOS: 8 days   Keymani Glynn F 09/15/2011,9:01 AM   .labalb

## 2011-09-15 NOTE — Progress Notes (Signed)
Patient: Gary Rivera DOB: 1960-03-23 Date of Admission: 09/07/2011            Pulmonary consult  Date of Consult: 09/15/2011 MD requesting consult:  Rizwan ([facility])  Reason for consult: respiratory failure   PROFILE: 51yo male with hx HTN, chronic renal failure on PD, CHF presented 6/19 to ER with c/o ~20 lbs weight gain and SOB.  He was admitted by hospitalist and seen by renal who rx with further PD and lasix.  On 6/20 he developed worsening resp distress with bilat infiltrates most c/w edema. Failed NPPV and required intubation  SUBJ: Tolerating extubation but required NPPV overnight for stridor. No distress. Voice is hoarse.   Filed Vitals:   09/15/11 1022 09/15/11 1143 09/15/11 1151 09/15/11 1236  BP:      Pulse: 89     Temp:   98.5 F (36.9 C)   TempSrc:   Oral   Resp: 21     Height:      Weight:      SpO2: 96% 98%  95%  EXAM: General:  Obese male, no audible stridor Neuro: MAEs, + F/C CV: RRR, distant heart sounds PULM: clear anteriorly, no wheeze GI: obese, round, mildly distended, PD cath Extremities:  Warm and dry, tr  edema  Derm: no rash or skin breakdown   CXR: CM, vasc congestion, NSC  CBC    Component Value Date/Time   WBC 12.7* 09/15/2011 0500   RBC 3.21* 09/15/2011 0500   HGB 9.1* 09/15/2011 0500   HCT 29.7* 09/15/2011 0500   PLT 218 09/15/2011 0500   MCV 92.5 09/15/2011 0500   MCH 28.3 09/15/2011 0500   MCHC 30.6 09/15/2011 0500   RDW 16.2* 09/15/2011 0500   LYMPHSABS 1.0 09/08/2011 0300   MONOABS 0.3 09/08/2011 0300   EOSABS 0.3 09/08/2011 0300   BASOSABS 0.0 09/08/2011 0300     BMET    Component Value Date/Time   NA 143 09/15/2011 0500   K 4.7 09/15/2011 0500   CL 91* 09/15/2011 0500   CO2 30 09/15/2011 0500   GLUCOSE 132* 09/15/2011 0500   BUN 76* 09/15/2011 0500   CREATININE 14.99* 09/15/2011 0500   CALCIUM 8.7 09/15/2011 0500   CALCIUM 8.0* 12/04/2009 0455   GFRNONAA 3* 09/15/2011 0500   GFRAA 4* 09/15/2011 0500    ASSESSMENT AND  PLAN  PULMONARY  ETT:  6/21 >> 6/26  A:  Acute on chronic hypercarbic resp failure -- multifactorial. Resolving pulm edema, probable OSA?OHS  Post extubation stridor (6/26) - somewhat improved   P:   -Cont PRN NPPV -Cont to monitor in ICU    CARDIOVASCULAR  ECHO 6/20: LVEF 40-45%, moderate LVH, diffuse HK, no MR noted, severe LA dilation, RV dilation, RVSP est 60 mmHg ProBNP 6/19: 24,858 Cardiac markers 6/21: NEGATIVE  A: Acute on chronic systolic CHF - EF 40-45%  Pulm HTN  P:   Maximize volume removal Cont Metoprolol to maintain HR < 115/min Cont Hydralazine PRN to maintain SBP < 160 mmHg   RENAL   Intake/Output Summary (Last 24 hours) at 09/15/11 1254 Last data filed at 09/15/11 1000  Gross per 24 hour  Intake    720 ml  Output      0 ml  Net    720 ml    Foley:  6/19 >> 6/25  A:  ESRD - on continuous PD  P:   Renal managing  CRRT > continuous PD 6/23     GASTROINTESTINAL A:  No  acute issues P:   Advance diet as tolerated   HEMATOLOGIC A:  Anemia, mild Thrombocytopenia, resolved P:  No s/s bleeding Transfuse for hgb <7 Cont aranesp   INFECTIOUS Cultures: BCx2 6/20 >>ng BC X 2 6/23 >>  Urine 6/20 >> NEG Resp 6/23 >> NOF PCT 6/26: 5/41 Blood 6/26 >>  Resp 6/26 >> few Gram neg coccobacilli >>   Antibiotics:  Vanc 6/26 >>  Cefepime 6/26 >>   A: Intermittent fevers- possible PNA P:   Abx as above. Narrow as indicated by cx results   ENDOCRINE  A:  No acute issues  P:   Monitor glucose on bmet    NEUROLOGIC  A:  AMS/ lethargy - resolved Intermittent agitation, resolved P: Cont PRN Ativan    BEST PRACTICE / DISPOSITION Level of Care:  ICU  Primary Service:  PCCM Consultants:  renal Code Status:  full Diet: NPO DVT Px: SCDs GI Px:  none Social / Family:  None at bedside      35 mins CCM time   Billy Fischer, MD ; [facility] service Mobile [phone].  After 5:30 PM or weekends, call [phone]

## 2011-09-16 LAB — CULTURE, RESPIRATORY W GRAM STAIN

## 2011-09-16 LAB — RENAL FUNCTION PANEL
Albumin: 3.1 g/dL — ABNORMAL LOW (ref 3.5–5.2)
BUN: 81 mg/dL — ABNORMAL HIGH (ref 6–23)
Chloride: 92 mEq/L — ABNORMAL LOW (ref 96–112)
GFR calc Af Amer: 4 mL/min — ABNORMAL LOW (ref >90)
Phosphorus: 9 mg/dL — ABNORMAL HIGH (ref 2.3–4.6)
Potassium: 4.1 mEq/L (ref 3.5–5.1)
Sodium: 145 mEq/L (ref 135–145)

## 2011-09-16 MED ORDER — FAMOTIDINE 20 MG PO TABS
20.0000 mg | ORAL_TABLET | Freq: Every day | ORAL | Status: DC
  Administered 2011-09-16 – 2011-09-25 (×10): 20 mg via ORAL
  Filled 2011-09-16 (×10): qty 1

## 2011-09-16 MED ORDER — CHLORHEXIDINE GLUCONATE 0.12 % MT SOLN
15.0000 mL | Freq: Two times a day (BID) | OROMUCOSAL | Status: DC
  Administered 2011-09-16 (×2): 15 mL via OROMUCOSAL
  Filled 2011-09-16 (×3): qty 15

## 2011-09-16 MED ORDER — BIOTENE DRY MOUTH MT LIQD
15.0000 mL | Freq: Two times a day (BID) | OROMUCOSAL | Status: DC
  Administered 2011-09-16 – 2011-09-17 (×3): 15 mL via OROMUCOSAL

## 2011-09-16 NOTE — Progress Notes (Signed)
Patient wanted off the the BiPAP so changed to areasol  face mask with 10l 40% O2 through an areasol bottle for patient having stridor.

## 2011-09-16 NOTE — Progress Notes (Signed)
Clinical Social Worker staffed case with [employer].  No additional CSW needs identified at this time.  CSW to sign off, please re consult if needed.    Angelia Mould, MSW, [place] [phone]

## 2011-09-16 NOTE — Progress Notes (Signed)
Patient: Gary Rivera DOB: September 29, 1960 Date of Admission: 09/07/2011            PCCM FU NOTE  Date of Consult: 09/16/2011 MD requesting consult:  Rizwan ([facility])  Reason for consult: respiratory failure   PROFILE: 51yo male with hx HTN, chronic renal failure on PD, CHF presented 6/19 to ER with c/o ~20 lbs weight gain and SOB.  He was admitted by hospitalist and seen by renal who rx with further PD and lasix.  On 6/20 he developed worsening resp distress with bilat infiltrates most c/w edema. Failed NPPV and required intubation  SUBJ: Cont to tolerate extubation but required NPPV overnight. No distress. Hoarseness improving.   Filed Vitals:   09/16/11 1400 09/16/11 1444 09/16/11 1500 09/16/11 1600  BP: 99/70  83/62   Pulse:   88   Temp:    97.9 F (36.6 C)  TempSrc:    Oral  Resp: 23  24   Height:      Weight:      SpO2:  100% 98%   EXAM: General:  Obese male, no audible stridor Neuro: MAEs, + F/C CV: RRR, distant heart sounds PULM: clear anteriorly, no wheeze GI: obese, round, mildly distended, PD cath Extremities:  Warm and dry, tr  edema  Derm: no rash or skin breakdown   CXR: No new film  CBC    Component Value Date/Time   WBC 12.7* 09/15/2011 0500   RBC 3.21* 09/15/2011 0500   HGB 9.1* 09/15/2011 0500   HCT 29.7* 09/15/2011 0500   PLT 218 09/15/2011 0500   MCV 92.5 09/15/2011 0500   MCH 28.3 09/15/2011 0500   MCHC 30.6 09/15/2011 0500   RDW 16.2* 09/15/2011 0500   LYMPHSABS 1.0 09/08/2011 0300   MONOABS 0.3 09/08/2011 0300   EOSABS 0.3 09/08/2011 0300   BASOSABS 0.0 09/08/2011 0300     BMET    Component Value Date/Time   NA 145 09/16/2011 0425   K 4.1 09/16/2011 0425   CL 92* 09/16/2011 0425   CO2 27 09/16/2011 0425   GLUCOSE 137* 09/16/2011 0425   BUN 81* 09/16/2011 0425   CREATININE 15.49* 09/16/2011 0425   CALCIUM 8.4 09/16/2011 0425   CALCIUM 8.0* 12/04/2009 0455   GFRNONAA 3* 09/16/2011 0425   GFRAA 4* 09/16/2011 0425    ASSESSMENT AND  PLAN  PULMONARY  ETT:  6/21 >> 6/26  A:  Acute on chronic hypercarbic resp failure -- multifactorial. Resolving pulm edema, probable OSA?OHS  Post extubation stridor (6/26) - somewhat improved   P:   -Cont PRN NPPV -Transfer to SDU  - Cont full liquid diet until throat pain and hoarseness have resolved, then advance   CARDIOVASCULAR  ECHO 6/20: LVEF 40-45%, moderate LVH, diffuse HK, no MR noted, severe LA dilation, RV dilation, RVSP est 60 mmHg ProBNP 6/19: 24,858 Cardiac markers 6/21: NEGATIVE  A: Acute on chronic systolic CHF - EF 40-45%  Pulm HTN  P:   Cont Metoprolol to maintain HR < 115/min Cont Hydralazine PRN to maintain SBP < 160 mmHg   RENAL   Intake/Output Summary (Last 24 hours) at 09/16/11 1646 Last data filed at 09/16/11 1300  Gross per 24 hour  Intake    990 ml  Output      0 ml  Net    990 ml    Foley:  6/19 >> 6/25  A:  ESRD - on continuous PD  P:   Renal managing  CRRT > continuous PD 6/23  GASTROINTESTINAL A:  No acute issues P:   Advance diet as tolerated   HEMATOLOGIC A:  Anemia, mild Thrombocytopenia, resolved P:  No s/s bleeding Transfuse for hgb <7 Cont aranesp   INFECTIOUS Cultures: BCx2 6/20 >>ng BC X 2 6/20 >> NEG Urine 6/20 >> NEG Resp 6/23 >> NOF PCT 6/26: 5/41 Resp 6/26 >>  NOF Blood 6/26 >>     Antibiotics:  Vanc 6/26 >> 6/28 Cefepime 6/26 >>   A: Intermittent fevers- possible PNA P:   Abx as above. Narrow as indicated by cx results F/U CXR 6/29  ENDOCRINE  A:  No acute issues  P:   Monitor glucose on bmet    NEUROLOGIC  A:  AMS/ lethargy - resolved Intermittent agitation, resolved P: Cont PRN Ativan    BEST PRACTICE / DISPOSITION Level of Care:  Transfer to SDU order written 6/28  Primary Service:  PCCM Consultants:  renal Code Status:  full Diet: NPO DVT Px: SCDs GI Px:  none Social / Family:  None at bedside        Billy Fischer, MD ; [facility] service Mobile  [phone].  After 5:30 PM or weekends, call [phone]

## 2011-09-16 NOTE — Care Management Note (Signed)
  Page 2 of 2   09/21/2011     3:06:16 PM   CARE MANAGEMENT NOTE 09/21/2011  Patient:  Gary Rivera, Gary Rivera   Account Number:  [id number]  Date Initiated:  09/08/2011  Documentation initiated by:  MAYO,HENRIETTA  Subjective/Objective Assessment:   51 yr-old male adm with dx of hypoxemia; lives with sister, has walker that he uses as needed.     Action/Plan:   Anticipated DC Date:  09/20/2011   Anticipated DC Plan:  HOME W HOME HEALTH SERVICES      DC Planning Services  CM consult      [facility] Choice  HOME HEALTH   Choice offered to / List presented to:  C-5 Sibling        HH arranged  HH-1 RN  HH-10 DISEASE MANAGEMENT  HH-2 PT      [facility] agency  [facility]   Status of service:  In process, will continue to follow  Comments:  Primary Care @ [facility]  09/20/11 1215 Verdis Prime RN MSN CCM Pt states sister will visit later and will choose the home care agency for PT. 1530 Provided list of [facility] agencies to sister and pt, discussed HH RN for [employer], [facility] PT as recommended, they will choose agency. 1545 Referral made to agency liaison as requested.  09-16-11 2:10pm Avie Arenas, RNBSN [phone] Remains extubated but on intermittent Bipap. Talked with sister by phone.  Patient lives her and has an aide visit monday-Friday for about 3 hours through his medicaid program.  Has PD at home.  PT recommending HHPT on discharge.  List left in room for sister.  09-15-11 10:15am Avie Arenas, RNBSN [phone] Extubated on 09-14-11.  On bipap - stridor - not tolerating weaning at this time.  09-13-11 1:35pm Avie Arenas, RNBSN [phone] Now on vent - poor weaning - initially on CVVH - now off   09/08/11 1500 Verdis Prime RN MSN CCM Per pt, he has home health nurse visiting, cannot recall the agency.

## 2011-09-16 NOTE — Progress Notes (Signed)
[facility]  Subjective:  Awake, alert, extubated, coughing, wonders when he can go home On CCPD--6 exchanges/d, 2.5 L /exchange, 2.5% alt with 4.25% detrose bags   Objective: Vital signs in last 24 hours: Blood pressure 108/67, pulse 85, temperature 97.6 F (36.4 C), temperature source Axillary, resp. rate 24, height 6\' 2"  (1.88 m), weight 134.6 kg (296 lb 11.8 oz), SpO2 95.00%.    PHYSICAL EXAM General--as above Chest--cheloid suprasternal area, rhonchi, cath removed from        R IJ Heart--no rub Abd--cath L rectus, nontender Extr--no edema  Weight   Date 09 Sep 2011  154,2 kg 24 Jun   140.1 kg 26 Jun   136.6 kg  28 Jun   134.6 kg       Lab Results:   Lab 09/16/11 0425 09/15/11 0500 09/14/11 0448  NA 145 143 139  K 4.1 4.7 3.8  CL 92* 91* 90*  CO2 27 30 32  BUN 81* 76* 61*  CREATININE 15.49* 14.99* 12.76*  ALB -- -- --  GLUCOSE 137* -- --  CALCIUM 8.4 8.7 8.1*  PHOS 9.0* 8.6* 5.0*     Basename 09/15/11 0500 09/14/11 0448  WBC 12.7* 9.0  HGB 9.1* 8.0*  HCT 29.7* 26.2*  PLT 218 145*    Assessment/Plan: 1. CHF/volume overload-CXR still looks better! O2 sat 95% on 40% FIO2. Now extubated on BiPap. D/C IV Lasix. 2.5% dextrose PD fluid now with clearer CXR and follow BP.  If BP drops, then use lower dextrose PDF fluid  2. ESRD - PD when at home (4 exchanges/d--2L/exchange)--but looks like he wasn't doing it at home. Will continue 6 exchanges per day and continue fill volume at 2.5 L. 2.5 % dextrose for now. Foley cath out  3. Anemia- continue aranesp.Fe/TIBC 24%, ferritin 779 . Hgb 9.1 yesterday. Getting IV Fe in addition to aranesp.  4. Secondary hyperparathyroidism- continue calcitriol and renvela plus renavite . C 8.5, phos 8.6--binders held while intubated--will resume  5. HTN/volume- much improved. CXR still looks better. Will use 2.5% dextrose in PD bags  6. Respiratory failure-BP now 108/67. Off pressors, off antibiotics, Extubated per  Pulmonary. Prior hx OSA  7. Hyperkalemia- resolved.    LOS: 9 days   Ruble Pumphrey F 09/16/2011,8:43 AM   .labalb

## 2011-09-17 ENCOUNTER — Inpatient Hospital Stay (HOSPITAL_COMMUNITY): Payer: Medicare Other

## 2011-09-17 LAB — CBC
HCT: 31.6 % — ABNORMAL LOW (ref 39.0–52.0)
MCHC: 31 g/dL (ref 30.0–36.0)
Platelets: 284 10*3/uL (ref 150–400)
RDW: 16.6 % — ABNORMAL HIGH (ref 11.5–15.5)
WBC: 9.7 10*3/uL (ref 4.0–10.5)

## 2011-09-17 LAB — MAGNESIUM: Magnesium: 2.4 mg/dL (ref 1.5–2.5)

## 2011-09-17 LAB — RENAL FUNCTION PANEL
Albumin: 3.2 g/dL — ABNORMAL LOW (ref 3.5–5.2)
Calcium: 8.7 mg/dL (ref 8.4–10.5)
GFR calc Af Amer: 3 mL/min — ABNORMAL LOW (ref >90)
GFR calc non Af Amer: 3 mL/min — ABNORMAL LOW (ref >90)
Phosphorus: 9 mg/dL — ABNORMAL HIGH (ref 2.3–4.6)
Potassium: 4 mEq/L (ref 3.5–5.1)
Sodium: 144 mEq/L (ref 135–145)

## 2011-09-17 MED ORDER — HYDROCODONE-ACETAMINOPHEN 5-325 MG PO TABS
1.0000 | ORAL_TABLET | Freq: Four times a day (QID) | ORAL | Status: DC | PRN
  Administered 2011-09-17 – 2011-09-19 (×2): 1 via ORAL
  Filled 2011-09-17 (×3): qty 1

## 2011-09-17 NOTE — Progress Notes (Signed)
ANTIBIOTIC CONSULT NOTE - FOLLOW UP  Pharmacy Consult for Cefepime Indication: pneumonia  Allergies  Allergen Reactions  . Prednisone Nausea Only    Patient Measurements: Height: 6\' 2"  (188 cm) Weight: 289 lb 11 oz (131.4 kg) IBW/kg (Calculated) : 82.2  Adjusted Body Weight:   Vital Signs: Temp: 100.4 F (38 C) (06/29 1158) Temp src: Oral (06/29 1158) BP: 90/41 mmHg (06/29 1200) Pulse Rate: 98  (06/29 1200) Intake/Output from previous day: 06/28 0701 - 06/29 0700 In: 840 [P.O.:840] Out: -  Intake/Output from this shift: Total I/O In: 240 [P.O.:240] Out: -   Labs:  Basename 09/17/11 0325 09/16/11 0425 09/15/11 0500  WBC 9.7 -- 12.7*  HGB 9.8* -- 9.1*  PLT 284 -- 218  LABCREA -- -- --  CREATININE 16.23* 15.49* 14.99*   Estimated Creatinine Clearance: 7.7 ml/min (by C-G formula based on Cr of 16.23). No results found for this basename: VANCOTROUGH:2,VANCOPEAK:2,VANCORANDOM:2,GENTTROUGH:2,GENTPEAK:2,GENTRANDOM:2,TOBRATROUGH:2,TOBRAPEAK:2,TOBRARND:2,AMIKACINPEAK:2,AMIKACINTROU:2,AMIKACIN:2, in the last 72 hours   Microbiology: Recent Results (from the past 720 hour(s))  MRSA PCR SCREENING     Status: Normal   Collection Time   09/08/11  2:24 AM      Component Value Range Status Comment   MRSA by PCR NEGATIVE  NEGATIVE Final   CULTURE, BLOOD (ROUTINE X 2)     Status: Normal   Collection Time   09/08/11 10:40 AM      Component Value Range Status Comment   Specimen Description BLOOD RIGHT FOREARM   Final    Special Requests BOTTLES DRAWN AEROBIC AND ANAEROBIC [facility]   Final    Culture  Setup Time [id number]   Final    Culture NO GROWTH 5 DAYS   Final    Report Status 09/14/2011 FINAL   Final   CULTURE, BLOOD (ROUTINE X 2)     Status: Normal   Collection Time   09/08/11 10:50 AM      Component Value Range Status Comment   Specimen Description BLOOD RIGHT HAND   Final    Special Requests BOTTLES DRAWN AEROBIC ONLY 5CC   Final    Culture  Setup Time  [id number]   Final    Culture NO GROWTH 5 DAYS   Final    Report Status 09/14/2011 FINAL   Final   URINE CULTURE     Status: Normal   Collection Time   09/08/11 10:19 PM      Component Value Range Status Comment   Specimen Description URINE, CLEAN CATCH   Final    Special Requests Normal   Final    Culture  Setup Time [id number]   Final    Colony Count NO GROWTH   Final    Culture NO GROWTH   Final    Report Status 09/09/2011 FINAL   Final   CULTURE, RESPIRATORY     Status: Normal   Collection Time   09/11/11 12:22 PM      Component Value Range Status Comment   Specimen Description TRACHEAL ASPIRATE   Final    Special Requests NONE   Final    Gram Stain     Final    Value: FEW WBC PRESENT,BOTH PMN AND MONONUCLEAR     RARE SQUAMOUS EPITHELIAL CELLS PRESENT     RARE GRAM POSITIVE COCCI IN PAIRS     RARE GRAM NEGATIVE RODS   Culture Non-Pathogenic Oropharyngeal-type Flora Isolated.   Final    Report Status 09/13/2011 FINAL   Final   CULTURE, RESPIRATORY  Status: Normal   Collection Time   09/14/11  9:42 AM      Component Value Range Status Comment   Specimen Description TRACHEAL ASPIRATE   Final    Special Requests NONE   Final    Gram Stain     Final    Value: MODERATE WBC PRESENT,BOTH PMN AND MONONUCLEAR     NO SQUAMOUS EPITHELIAL CELLS SEEN     FEW GRAM NEGATIVE COCCOBACILLI   Culture Non-Pathogenic Oropharyngeal-type Flora Isolated.   Final    Report Status 09/16/2011 FINAL   Final   CULTURE, BLOOD (ROUTINE X 2)     Status: Normal (Preliminary result)   Collection Time   09/14/11 10:00 AM      Component Value Range Status Comment   Specimen Description BLOOD ARM LEFT   Final    Special Requests BOTTLES DRAWN AEROBIC AND ANAEROBIC 10CC   Final    Culture  Setup Time [id number]   Final    Culture     Final    Value:        BLOOD CULTURE RECEIVED NO GROWTH TO DATE CULTURE WILL BE HELD FOR 5 DAYS BEFORE ISSUING A FINAL NEGATIVE REPORT   Report Status PENDING    Incomplete   CULTURE, BLOOD (ROUTINE X 2)     Status: Normal (Preliminary result)   Collection Time   09/14/11 10:07 AM      Component Value Range Status Comment   Specimen Description BLOOD ARM RIGHT   Final    Special Requests BOTTLES DRAWN AEROBIC AND ANAEROBIC 10CC   Final    Culture  Setup Time [id number]   Final    Culture     Final    Value:        BLOOD CULTURE RECEIVED NO GROWTH TO DATE CULTURE WILL BE HELD FOR 5 DAYS BEFORE ISSUING A FINAL NEGATIVE REPORT   Report Status PENDING   Incomplete     Anti-infectives     Start     Dose/Rate Route Frequency Ordered Stop   09/15/11 1400   ceFEPIme (MAXIPIME) 2 g in dextrose 5 % 50 mL IVPB        2 g 100 mL/hr over 30 Minutes Intravenous Every 48 hours 09/15/11 1359     09/14/11 1400   vancomycin (VANCOCIN) 3,000 mg in sodium chloride 0.9 % 500 mL IVPB        3,000 mg 125 mL/hr over 240 Minutes Intravenous  Once 09/14/11 1249 09/14/11 1836   09/14/11 1300   ceFEPIme (MAXIPIME) 1 g in dextrose 5 % 50 mL IVPB  Status:  Discontinued        1 g 100 mL/hr over 30 Minutes Intravenous Every 24 hours 09/14/11 1251 09/15/11 1359          Assessment: Vancomycin has been d/c'd. Pt continues on Cefepime. Dose is appropriate. Pt is on peritoneal dialysis with 6 exchanges daily. WBC down to 9.7. H/H increasing. T 98.4, BP 103/62.    Plan:  Continue Cefepime as ordered.  Eugene Garnet 09/17/2011,12:49 PM

## 2011-09-17 NOTE — Progress Notes (Signed)
[facility]  Subjective:  Awake, still with productive cough, getting Q 4 hr PD exchanges with 2.5L bags and all 2.5% dextrose   Objective: Vital signs in last 24 hours: Blood pressure 103/62, pulse 95, temperature 98.4 F (36.9 C), temperature source Oral, resp. rate 23, height 6\' 2"  (1.88 m), weight 131.4 kg (289 lb 11 oz), SpO2 95.00%.    PHYSICAL EXAM General--awake. alert Chest--rhonchi Heart--no rub Abd--nontender,PD exit site L rectus OK Extr--no edema  Weight  Date  09 Sep 2011  154,2 kg  24 Jun  140.1 kg  26 Jun  136.6 kg  28 Jun  134.6 kg 29 Jun  131.4 kg    Lab Results:   Lab 09/17/11 0325 09/16/11 0425 09/15/11 0500  NA 144 145 143  K 4.0 4.1 4.7  CL 92* 92* 91*  CO2 26 27 30   BUN 82* 81* 76*  CREATININE 16.23* 15.49* 14.99*  ALB -- -- --  GLUCOSE 122* -- --  CALCIUM 8.7 8.4 8.7  PHOS 9.0* 9.0* 8.6*     Basename 09/17/11 0325 09/15/11 0500  WBC 9.7 12.7*  HGB 9.8* 9.1*  HCT 31.6* 29.7*  PLT 284 218    Assessment/Plan: 1. CHF/volume overload-CXR still looks better! O2 sat 95% on  5 L O2. 2.5% dextrose PD fluid --6 exch/day   2. ESRD - PD when at home (4 exchanges/d--2L/exchange)--but looks like he wasn't doing it at home. Will continue 6 exchanges per day and continue fill volume at 2.5 L, 2.5 % dextrose for now. Foley cath out.  Weight continues to decrease with 2.5% dextrose  3. Anemia- continue aranesp.Fe/TIBC 24%, ferritin 779 . Hgb 9.1 yesterday. Getting IV Fe in addition to aranesp.  4. Secondary hyperparathyroidism- continue calcitriol and renvela plus renavite . C 8.7, phos 9--binders held while intubated--increase renvela to 2 TID.  PTH 248 last year--will check again  5. HTN/volume- much improved. CXR still looks better. Will use 2.5% dextrose in PD bags  6. Respiratory failure-BP now 103/62. Off pressors, off antibiotics, Extubated per Pulmonary. Prior hx OSA     LOS: 10 days   Annelie Boak F 09/17/2011,9:35  AM   .labalb

## 2011-09-17 NOTE — Progress Notes (Signed)
Patient: Gary Rivera DOB: 1960-08-06 Date of Admission: 09/07/2011            PCCM FU NOTE  Date of Consult: 09/17/2011 MD requesting consult:  Rizwan ([facility])  Reason for consult: respiratory failure   PROFILE: 51yo male with hx HTN, chronic renal failure on PD, CHF presented 6/19 to ER with c/o ~20 lbs weight gain and SOB.  He was admitted by hospitalist and seen by renal who rx with further PD and lasix.  On 6/20 he developed worsening resp distress with bilat infiltrates most c/w edema. Failed NPPV and required intubation  SUBJ: Cont to tolerate extubation but required NPPV overnight. No distress. Hoarseness improving.  Staff note: He has stridor but reports are is better  Filed Vitals:   09/17/11 0500 09/17/11 0600 09/17/11 0810 09/17/11 0824  BP: 103/62     Pulse: 103 95    Temp:   98.4 F (36.9 C)   TempSrc:   Oral   Resp: 23 23    Height:      Weight: 289 lb 11 oz (131.4 kg)     SpO2: 96% 98%  95%  EXAM: General:  Obese male, +vcd noted Neuro: MAEs, + F/C CV: RRR, distant heart sounds PULM: clear anteriorly, no wheeze GI: obese, round, mildly distended, PD cath Extremities:  Warm and dry, tr  edema  Derm: no rash or skin breakdown   Lab 09/17/11 0325 09/16/11 0425 09/15/11 0500  NA 144 145 143  K 4.0 4.1 4.7  CL 92* 92* 91*  CO2 26 27 30   BUN 82* 81* 76*  CREATININE 16.23* 15.49* 14.99*  GLUCOSE 122* 137* 132*    Lab 09/17/11 0325 09/15/11 0500 09/14/11 0448  HGB 9.8* 9.1* 8.0*  HCT 31.6* 29.7* 26.2*  WBC 9.7 12.7* 9.0  PLT 284 218 145*    Dg Chest Port 1 View  09/17/2011  *RADIOLOGY REPORT*  Clinical Data: Respiratory failure  PORTABLE CHEST - 1 VIEW  Comparison: 09/15/2011  Findings: The right central line has been removed.  Low lung volumes with persistent patchy atelectasis or infiltrates in the lung bases.  Mild cardiomegaly stable.  No definite effusion or overt interstitial edema.  IMPRESSION:  1.  Interval removal of central line.  Otherwise  stable appearance since previous exam.  Original Report Authenticated By: Thora Lance III, M.D.    ASSESSMENT AND PLAN  PULMONARY  ETT:  6/21 >> 6/26  A:  Acute on chronic hypercarbic resp failure -- multifactorial. Resolving pulm edema, probable OSA?OHS  Post extubation stridor (6/26) - somewhat improved  On 6/29  P:   -Cont PRN NPPV -Transfer to SDU  - Cont full liquid diet until throat pain and hoarseness have resolved, then advance - Continue tomonitor stridor closely   CARDIOVASCULAR  ECHO 6/20: LVEF 40-45%, moderate LVH, diffuse HK, no MR noted, severe LA dilation, RV dilation, RVSP est 60 mmHg ProBNP 6/19: 24,858 Cardiac markers 6/21: NEGATIVE  A: Acute on chronic systolic CHF - EF 40-45%  Pulm HTN  P:   Cont Metoprolol to maintain HR < 115/min Cont Hydralazine PRN to maintain SBP < 160 mmHg   RENAL   Intake/Output Summary (Last 24 hours) at 09/17/11 0835 Last data filed at 09/17/11 0200  Gross per 24 hour  Intake    720 ml  Output      0 ml  Net    720 ml    Foley:  6/19 >> 6/25  A:  ESRD -  on continuous PD  P:   Renal managing  CRRT > continuous PD 6/23     GASTROINTESTINAL A:  No acute issues P:   Advance diet as tolerated   HEMATOLOGIC A:  Anemia, mild Thrombocytopenia, resolved P:  No s/s bleeding Transfuse for hgb <7 Cont aranesp   INFECTIOUS Cultures: BCx2 6/20 >>ng BC X 2 6/20 >> NEG Urine 6/20 >> NEG Resp 6/23 >> NOF PCT 6/26: 5/41 Resp 6/26 >>  NOF Blood 6/26 >>     Antibiotics:  Vanc 6/26 >> 6/28 Cefepime 6/26 >>   A: Intermittent fevers- possible PNA P:   Abx as above. Narrow as indicated by cx results F/U CXR as needed  ENDOCRINE  A:  No acute issues  P:   Monitor glucose on bmet    NEUROLOGIC  A:  AMS/ lethargy - resolved Intermittent agitation, resolved P: Cont PRN Ativan  Global: 6/29 tx to sdu and back to triad 6/30  BEST PRACTICE / DISPOSITION Level of Care:  Transfer to SDU order  written 6/29  Primary Service:  PCCM Consultants:  renal Code Status:  full Diet: NPO DVT Px: SCDs GI Px:  none Social / Family:  None at bedside        [facility] Minor ACNP Adolph Pollack PCCM Pager [phone] till 3 pm If no answer page [phone] 09/17/2011, 8:35 AM   STAFF NOTE: I, Dr Lavinia Sharps have personally reviewed patient's available data, including medical history, events of note, physical examination and test results as part of my evaluation. I have discussed with resident/NP and other care providers such as pharmacist, RN and RRT.  In addition,  I personally evaluated patient and elicited key findings of  Acute respiratory failure.Now extubated but having stridor that per report is improving but still somewhat tachypneic and is at risk for reintubation;will keep him inICU due to high risk for deterioration.  Rest per NP/medical resident whose note is outlined above and that I agree with  The patient is critically ill with multiple organ systems failure and requires high complexity decision making for assessment and support, frequent evaluation and titration of therapies, application of advanced monitoring technologies and extensive interpretation of multiple databases.   Critical Care Time devoted to patient care services described in this note is  35  Minutes.  Dr. Kalman Shan, M.D., [facility]C.P Pulmonary and Critical Care Medicine Staff Physician [facility] Pager: [phone], If no answer or between  15:00h - 7:00h: call [phone]  09/17/2011 12:14 PM

## 2011-09-18 ENCOUNTER — Inpatient Hospital Stay (HOSPITAL_COMMUNITY): Payer: Medicare Other

## 2011-09-18 LAB — RENAL FUNCTION PANEL
Albumin: 3.4 g/dL — ABNORMAL LOW (ref 3.5–5.2)
CO2: 24 mEq/L (ref 19–32)
Chloride: 87 mEq/L — ABNORMAL LOW (ref 96–112)
GFR calc Af Amer: 3 mL/min — ABNORMAL LOW (ref >90)
GFR calc non Af Amer: 3 mL/min — ABNORMAL LOW (ref >90)
Potassium: 4.2 mEq/L (ref 3.5–5.1)
Sodium: 137 mEq/L (ref 135–145)

## 2011-09-18 LAB — MAGNESIUM: Magnesium: 2.5 mg/dL (ref 1.5–2.5)

## 2011-09-18 LAB — CBC
Hemoglobin: 11 g/dL — ABNORMAL LOW (ref 13.0–17.0)
Platelets: 323 10*3/uL (ref 150–400)
RBC: 3.7 MIL/uL — ABNORMAL LOW (ref 4.22–5.81)
WBC: 11.7 10*3/uL — ABNORMAL HIGH (ref 4.0–10.5)

## 2011-09-18 MED ORDER — BIOTENE DRY MOUTH MT LIQD
15.0000 mL | Freq: Two times a day (BID) | OROMUCOSAL | Status: DC
  Administered 2011-09-18 – 2011-09-25 (×12): 15 mL via OROMUCOSAL

## 2011-09-18 MED ORDER — SEVELAMER CARBONATE 800 MG PO TABS
2400.0000 mg | ORAL_TABLET | Freq: Three times a day (TID) | ORAL | Status: DC
  Administered 2011-09-18 (×2): 2400 mg via ORAL
  Administered 2011-09-19: 1600 mg via ORAL
  Administered 2011-09-19: 2400 mg via ORAL
  Administered 2011-09-19: 1600 mg via ORAL
  Administered 2011-09-20 – 2011-09-25 (×17): 2400 mg via ORAL
  Filled 2011-09-18 (×25): qty 3

## 2011-09-18 NOTE — Progress Notes (Signed)
Patient: Gary Rivera DOB: 10-Aug-1960 Date of Admission: 09/07/2011            PCCM FU NOTE  Date of Consult: 09/18/2011 MD requesting consult:  Rizwan ([facility])  Reason for consult: respiratory failure   PROFILE: 51yo male with hx HTN, chronic renal failure on PD, CHF presented 6/19 to ER with c/o ~20 lbs weight gain and SOB.  He was admitted by hospitalist and seen by renal who rx with further PD and lasix.  On 6/20 he developed worsening resp distress with bilat infiltrates most c/w edema. Failed NPPV and required intubation  SUBJ: Did not use  NPPV overnight. No distress -able to lie supine. Hoarseness improving.  Asked by dr Caryn Section to clarify -CPAP usage & abx   Filed Vitals:   09/18/11 0201 09/18/11 0426 09/18/11 0717 09/18/11 0825  BP:  94/65  116/70  Pulse:  99  104  Temp:  98.9 F (37.2 C)  99.3 F (37.4 C)  TempSrc:  Oral  Oral  Resp:  17  21  Height:      Weight:  133 kg (293 lb 3.4 oz)    SpO2: 94% 95% 98% 94%   EXAM: General:  Obese male, +vcd noted Neuro: MAEs, + F/C CV: RRR, distant heart sounds PULM: clear anteriorly, no wheeze, upper airway insp & exp stridorous sounds noted -reportedly better GI: obese, round, mildly distended, PD cath Extremities:  Warm and dry, tr  edema  Derm: no rash or skin breakdown   Lab 09/18/11 0355 09/17/11 0325 09/16/11 0425  NA 137 144 145  K 4.2 4.0 4.1  CL 87* 92* 92*  CO2 24 26 27   BUN 85* 82* 81*  CREATININE 16.79* 16.23* 15.49*  GLUCOSE 130* 122* 137*    Lab 09/18/11 0355 09/17/11 0325 09/15/11 0500  HGB 11.0* 9.8* 9.1*  HCT 34.2* 31.6* 29.7*  WBC 11.7* 9.7 12.7*  PLT 323 284 218    pCXR -6/30 - bibasal ASD vs atx  ASSESSMENT AND PLAN  PULMONARY  ETT:  6/21 >> 6/26  A:  Acute on chronic hypercarbic resp failure -- multifactorial. Resolving pulm edema, probable OSA?OHS  Post extubation stridor (6/26) - somewhat improved  On 6/29  P:   -AutoCPAP use 4h on & off  MANDATORY DURING SLEEP & NAPS - would  discharge on this & obtain sleep study as outpt - Cont full liquid diet until throat pain and hoarseness have resolved, then advance - Continue to monitor stridor closely - expect to improve with time & CPAP   CARDIOVASCULAR  ECHO 6/20: LVEF 40-45%, moderate LVH, diffuse HK, no MR noted, severe LA dilation, RV dilation, RVSP est 60 mmHg ProBNP 6/19: 24,858 Cardiac markers 6/21: NEGATIVE  A: Acute on chronic systolic CHF - EF 40-45%  Pulm HTN  P:   Cont Metoprolol to maintain HR < 115/min Cont Hydralazine PRN to maintain SBP < 160 mmHg   RENAL   Intake/Output Summary (Last 24 hours) at 09/18/11 1039 Last data filed at 09/17/11 2200  Gross per 24 hour  Intake    903 ml  Output      0 ml  Net    903 ml    Foley:  6/19 >> 6/25  A:  ESRD - on continuous PD  P:   Renal managing  CRRT > continuous PD 6/23     HEMATOLOGIC A:  Anemia, mild Thrombocytopenia, resolved P:  No s/s bleeding Transfuse for hgb <7 Cont aranesp  INFECTIOUS Cultures: BCx2 6/20 >>ng BC X 2 6/20 >> NEG Urine 6/20 >> NEG Resp 6/23 >> NOF PCT 6/26: 5/41 Resp 6/26 >>  NOF Blood 6/26 >> ng    Antibiotics:  Vanc 6/26 >> 6/28 Cefepime 6/26 >>   A: Intermittent fevers- possible PNA P:  Cx neg, rechk pct, if low can stop cefepime 7/1   NEUROLOGIC  A:  AMS/ lethargy - resolved Intermittent agitation, resolved P: Cont PRN Ativan  Global: 6/29 tx to sdu and back to triad 6/30  BEST PRACTICE / DISPOSITION Level of Care:   SDU Primary Service:  PCCM Consultants:  renal Code Status:  full Diet: renal DVT Px: SCDs GI Px:  none Social / Family:  None at bedside    Cyril Mourning MD. FCCP. [place] [facility] Pager [phone] If no response call [phone]    09/18/2011 10:39 AM

## 2011-09-18 NOTE — Progress Notes (Signed)
PT has refused CPAP for the night. PT refused for the day shift and night shift RT. RT advised PT to let us know if he changes his mind. RT will continue to monitor.

## 2011-09-18 NOTE — Care Management (Signed)
Pt refused cpap at this time during the day.  Will try at night to get patient to try wearing the cpap.

## 2011-09-18 NOTE — Progress Notes (Signed)
[facility] [place] TEAM 1 - Stepdown/ICU TEAM  PCP:  Geraldo Pitter, MD  Subjective: 51yo male with hx HTN, chronic renal failure on PD, and CHF who presented 6/19 to ER with c/o ~20 lbs weight gain and SOB. He was admitted by [facility] and seen by Renal who tx with further PD and lasix. On 6/20 he developed worsening resp distress with bilat infiltrates most c/w edema. He failed NPPV and required intubation.  He was extubated on 6/26.  Pt seen in f/u by PCCM today.  [facility] is assuming care of this patient.  I have reviewed his chart in detail, but will not examine him so as to not duplicate charges in that pt has been seen by PCCM already today.  Objective:  Intake/Output Summary (Last 24 hours) at 09/18/11 1422 Last data filed at 09/18/11 1400  Gross per 24 hour  Intake   1136 ml  Output      0 ml  Net   1136 ml   Blood pressure 94/58, pulse 92, temperature 98.3 F (36.8 C), temperature source Oral, resp. rate 22, height 6\' 2"  (1.88 m), weight 133 kg (293 lb 3.4 oz), SpO2 97.00%.  Physical Exam: No exam today - see above note  Lab Results:  Basename 09/18/11 0355 09/17/11 0325 09/16/11 0425  NA 137 144 145  K 4.2 4.0 4.1  CL 87* 92* 92*  CO2 24 26 27   GLUCOSE 130* 122* 137*  BUN 85* 82* 81*  CREATININE 16.79* 16.23* 15.49*  CALCIUM 9.4 8.7 8.4  MG 2.5 2.4 2.5  PHOS 9.9* 9.0* 9.0*    Basename 09/18/11 0355 09/17/11 0325  AST -- --  ALT -- --  ALKPHOS -- --  BILITOT -- --  PROT -- --  ALBUMIN 3.4* 3.2*    Basename 09/18/11 0355 09/17/11 0325  WBC 11.7* 9.7  NEUTROABS -- --  HGB 11.0* 9.8*  HCT 34.2* 31.6*  MCV 92.4 92.4  PLT 323 284   Micro Results: Recent Results (from the past 240 hour(s))  URINE CULTURE     Status: Normal   Collection Time   09/08/11 10:19 PM      Component Value Range Status Comment   Specimen Description URINE, CLEAN CATCH   Final    Special Requests Normal   Final    Culture  Setup Time [id number]   Final    Colony Count NO  GROWTH   Final    Culture NO GROWTH   Final    Report Status 09/09/2011 FINAL   Final   CULTURE, RESPIRATORY     Status: Normal   Collection Time   09/11/11 12:22 PM      Component Value Range Status Comment   Specimen Description TRACHEAL ASPIRATE   Final    Special Requests NONE   Final    Gram Stain     Final    Value: FEW WBC PRESENT,BOTH PMN AND MONONUCLEAR     RARE SQUAMOUS EPITHELIAL CELLS PRESENT     RARE GRAM POSITIVE COCCI IN PAIRS     RARE GRAM NEGATIVE RODS   Culture Non-Pathogenic Oropharyngeal-type Flora Isolated.   Final    Report Status 09/13/2011 FINAL   Final   CULTURE, RESPIRATORY     Status: Normal   Collection Time   09/14/11  9:42 AM      Component Value Range Status Comment   Specimen Description TRACHEAL ASPIRATE   Final    Special Requests NONE   Final  Gram Stain     Final    Value: MODERATE WBC PRESENT,BOTH PMN AND MONONUCLEAR     NO SQUAMOUS EPITHELIAL CELLS SEEN     FEW GRAM NEGATIVE COCCOBACILLI   Culture Non-Pathogenic Oropharyngeal-type Flora Isolated.   Final    Report Status 09/16/2011 FINAL   Final   CULTURE, BLOOD (ROUTINE X 2)     Status: Normal (Preliminary result)   Collection Time   09/14/11 10:00 AM      Component Value Range Status Comment   Specimen Description BLOOD ARM LEFT   Final    Special Requests BOTTLES DRAWN AEROBIC AND ANAEROBIC 10CC   Final    Culture  Setup Time [id number]   Final    Culture     Final    Value:        BLOOD CULTURE RECEIVED NO GROWTH TO DATE CULTURE WILL BE HELD FOR 5 DAYS BEFORE ISSUING A FINAL NEGATIVE REPORT   Report Status PENDING   Incomplete   CULTURE, BLOOD (ROUTINE X 2)     Status: Normal (Preliminary result)   Collection Time   09/14/11 10:07 AM      Component Value Range Status Comment   Specimen Description BLOOD ARM RIGHT   Final    Special Requests BOTTLES DRAWN AEROBIC AND ANAEROBIC 10CC   Final    Culture  Setup Time [id number]   Final    Culture     Final    Value:        BLOOD  CULTURE RECEIVED NO GROWTH TO DATE CULTURE WILL BE HELD FOR 5 DAYS BEFORE ISSUING A FINAL NEGATIVE REPORT   Report Status PENDING   Incomplete     Studies/Results: All recent x-ray/radiology reports have been reviewed in detail.   Medications: I have reviewed the patient's complete medication list.  Assessment/Plan:  Acute on chronic hypercarbic resp failure Multifactorial - see elements below   Refractory pulmonary edema  OSA/OHS Mandatory CPAP QHS and during naps per Pulm - needs outpt sleep study  Post-extubation stridor and horseness Remains of full liquid diet at this time  Mild systolic CHF (EF 21-30% via echo this admit)  Pulm HTN  ESRD As per Nephrology  Chronic anemia  Intermittent fever - ? PNA  Plan is to d/c abx on 09/19/2011 unless procalcitonin remains elevated  Lonia Blood, MD [facility] Office  [phone] Pager [phone]  On-Call/Text Page:      [email or web address]      password [facility]

## 2011-09-18 NOTE — Progress Notes (Signed)
Pt refused CPAP.  Dr. Marchelle Gearing made aware.

## 2011-09-18 NOTE — Progress Notes (Signed)
[facility]  Subjective:  Awake, alert, getting ready for breakfast Still with stridor   Objective: Vital signs in last 24 hours: Blood pressure 94/65, pulse 99, temperature 98.9 F (37.2 C), temperature source Oral, resp. rate 17, height 6\' 2"  (1.88 m), weight 133 kg (293 lb 3.4 oz), SpO2 98.00%.    PHYSICAL EXAM General--as above Chest--insp wheeze, rhonchi, no rales Heart--no rub Abd--PD cath  L Rectus, nontender Extr--trace edema  Weight  Date  09 Sep 2011  154,2 kg  24 Jun  140.1 kg  26 Jun  136.6 kg  28 Jun   134.6 kg  29 Jun  131.4 kg 30 Jun  133    kg     Lab Results:   Lab 09/18/11 0355 09/17/11 0325 09/16/11 0425  NA 137 144 145  K 4.2 4.0 4.1  CL 87* 92* 92*  CO2 24 26 27   BUN 85* 82* 81*  CREATININE 16.79* 16.23* 15.49*  ALB -- -- --  GLUCOSE 130* -- --  CALCIUM 9.4 8.7 8.4  PHOS 9.9* 9.0* 9.0*     Basename 09/18/11 0355 09/17/11 0325  WBC 11.7* 9.7  HGB 11.0* 9.8*  HCT 34.2* 31.6*  PLT 323 284    Assessment/Plan: 1. CHF/volume overload-CXR still looks better! O2 sat 98% on  3 L O2. 2.5% dextrose PD fluid --6 exch/day  2. ESRD - PD when at home (4 exchanges/d--2L/exchange)--but looks like he wasn't doing it at home. Will continue 6 exchanges per day and continue fill volume at 2.5 L, 2.5 % dextrose for now. Foley cath out. Weight increased overnight with 2.5% only dextrose dialysate 3. Anemia- continue aranesp.Fe/TIBC 24%, ferritin 779 . Hgb 11.o today. Getting IV Fe in addition to aranesp.  4. Secondary hyperparathyroidism- continue calcitriol and renvela plus renavite . C 9.4, phos 9.9--binders held while intubated--increase renvela to 3 TID. PTH 248 last year--will check again  5. HTN/volume- much improved. CXR still looks better. Will use 2.5% dextrose in PD bags  6. Respiratory failure-BP now 94/65. Off pressors, off antibiotics, Extubated per Pulmonary. Prior hx OSA.    TO PULMONARY:    Does he need CPAP at night?  Can  Maxepine be d/c'd?     LOS: 11 days   Elise Gladden F 09/18/2011,8:16 AM   .labalb

## 2011-09-19 DIAGNOSIS — I953 Hypotension of hemodialysis: Secondary | ICD-10-CM | POA: Diagnosis not present

## 2011-09-19 LAB — CBC
Hemoglobin: 10.3 g/dL — ABNORMAL LOW (ref 13.0–17.0)
MCH: 28 pg (ref 26.0–34.0)
MCHC: 30.7 g/dL (ref 30.0–36.0)
Platelets: 314 10*3/uL (ref 150–400)
RBC: 3.68 MIL/uL — ABNORMAL LOW (ref 4.22–5.81)

## 2011-09-19 LAB — RENAL FUNCTION PANEL
Albumin: 3.3 g/dL — ABNORMAL LOW (ref 3.5–5.2)
CO2: 25 mEq/L (ref 19–32)
Chloride: 87 mEq/L — ABNORMAL LOW (ref 96–112)
GFR calc Af Amer: 3 mL/min — ABNORMAL LOW (ref >90)
GFR calc non Af Amer: 3 mL/min — ABNORMAL LOW (ref >90)
Sodium: 138 mEq/L (ref 135–145)

## 2011-09-19 LAB — PROCALCITONIN: Procalcitonin: 2.51 ng/mL

## 2011-09-19 MED ORDER — SODIUM CHLORIDE 0.9 % IV BOLUS (SEPSIS)
500.0000 mL | Freq: Once | INTRAVENOUS | Status: AC
  Administered 2011-09-19: 500 mL via INTRAVENOUS

## 2011-09-19 MED ORDER — BOOST / RESOURCE BREEZE PO LIQD
1.0000 | ORAL | Status: DC
  Administered 2011-09-19 – 2011-09-22 (×3): 1 via ORAL

## 2011-09-19 MED ORDER — SEVELAMER CARBONATE 800 MG PO TABS
1600.0000 mg | ORAL_TABLET | Freq: Three times a day (TID) | ORAL | Status: DC | PRN
  Filled 2011-09-19: qty 2

## 2011-09-19 MED ORDER — DELFLEX-LC/1.5% DEXTROSE 346 MOSM/L IP SOLN
INTRAPERITONEAL | Status: DC
  Administered 2011-09-19: 20:00:00 via INTRAPERITONEAL

## 2011-09-19 NOTE — Progress Notes (Signed)
RT returned to patient to put cpap on and patient refused to wear cpap. RT explained to patient the importance of cpap patient still refused cpap. RT will continue to monitor.

## 2011-09-19 NOTE — Progress Notes (Signed)
09/19/2011 6:57 PM  Went down to do CAPD exchange on patient and BP was 71/40.  Pt appears asymptomatic.  Dr. Briant Cedar notified, orders received.  Will continue to monitor patient.  Eunice Blase

## 2011-09-19 NOTE — Progress Notes (Addendum)
Pt BP 70/49 at around 1500 hour. MD notified, specified for continued monitoring of asymptomatic patient.  16:20 Pt refused to allow nurse to monitor oxygen saturation, 16:26 pt reconsidered (oxygen saturation level ranged low 90's)  Pt's sister stated that brother has had a change in demeanor since arrival in hospital, also stated that pt. has had mental problems in the past.  Sister is requesting psych consult, nurse will inform MD.

## 2011-09-19 NOTE — Progress Notes (Signed)
RT asked patient if he was ready to start his cpap and pt responded no not now maybe later. RT informed patient that he was supposed to wear 4hrs on and 4hrs off. RT will continue to monitor.

## 2011-09-19 NOTE — Progress Notes (Signed)
Assessment/Plan:  1. CHF/volume overload-  3 L O2.  2.5% dextrose PD fluid --6 exch/day good uf 2. ESRD - PD when at home (4 exchanges/d--2L/exchange)--may have been inadequate home RX or inadequate peritoneal membrane. Will continue 6 exchanges per day and continue fill volume at 2.5 L, 2.5 % dextrose for now. His creatintine has increased. I wonder if he is not able to clear adequately with CAPD.  He might need to change to CCPD or HD.  This is a poor start to dialysis.  I think he needs an AV access. 3. Anemia- continue aranesp. 4. Secondary hyperparathyroidism- continue calcitriol and renvela plus renavite .  5. HTN/volume- much improved.BP low.  Does he need fentanyl? CXR still looks better. Will cont to use 2.5% dextrose in PD bags  6. Respiratory failure-BP low. Prior hx OSA.   Subjective: Interval History: Want to do PD as option for TX  Objective: Vital signs in last 24 hours: Temp:  [98.1 F (36.7 C)-99 F (37.2 C)] 98.7 F (37.1 C) (07/01 0756) Pulse Rate:  [91-102] 98  (07/01 0756) Resp:  [16-27] 16  (07/01 0756) BP: (94-107)/(56-91) 96/91 mmHg (07/01 0756) SpO2:  [95 %-100 %] 96 % (07/01 0756) Weight change:   Intake/Output from previous day: 06/30 0701 - 07/01 0700 In: 2023 [P.O.:2020; I.V.:3] Out: 150 [Urine:150] Intake/Output this shift: Total I/O In: 123 [P.O.:120; I.V.:3] Out: -   General appearance: alert, cooperative and appears stated age Obese male stridorous breathing Lungs clear Abdomen obese extrem 1+ edema Neuro awake and alert  Lab Results:  Basename 09/19/11 0435 09/18/11 0355  WBC 11.6* 11.7*  HGB 10.3* 11.0*  HCT 33.6* 34.2*  PLT 314 323   BMET:  Basename 09/19/11 0435 09/18/11 0355  NA 138 137  K 4.1 4.2  CL 87* 87*  CO2 25 24  GLUCOSE 125* 130*  BUN 93* 85*  CREATININE 18.06* 16.79*  CALCIUM 9.3 9.4   No results found for this basename: PTH:2 in the last 72 hours Iron Studies: No results found for this basename:  IRON,TIBC,TRANSFERRIN,FERRITIN in the last 72 hours Studies/Results: Dg Chest Port 1 View  09/18/2011  *RADIOLOGY REPORT*  Clinical Data: Evaluate for pneumonia.  PORTABLE CHEST - 1 VIEW  Comparison: Chest x-ray 09/17/2011.  Findings: Lung volumes are low.  There are bibasilar opacities (left greater than right), favored to predominately reflect atelectasis, although superimposed air space consolidation (particularly in the left lower lobe) is difficult to exclude. Possible small left-sided pleural effusion.  Pulmonary venous congestion (likely accentuated by low lung volumes), without frank pulmonary edema.  Heart size is mildly enlarged (unchanged). Mediastinal contours are unremarkable.  IMPRESSION: 1.  Low lung volumes with bibasilar opacities (left greater than right) which it is favored to represent areas of atelectasis, however, underlying airspace consolidation from aspiration or infection is difficult to entirely exclude (particularly in the left lower lobe). 2.  Possible small left pleural effusion. 3.  Mild cardiomegaly is unchanged.  Original Report Authenticated By: Florencia Reasons, M.D.    I have reviewed the patient's current medications.      LOS: 12 days   Avett Reineck C 09/19/2011,10:53 AM

## 2011-09-19 NOTE — Progress Notes (Signed)
Pt is not compliant with the fluid restriction. Will continue to educate

## 2011-09-19 NOTE — Progress Notes (Deleted)
Phlebotomist unable to draw blood the morning lab

## 2011-09-19 NOTE — Progress Notes (Signed)
Nutrition Follow-up  Intervention:   1. Add Resource Breeze po daily, each supplement provides 250 kcal and 9 grams of protein. 2. RD to continue to follow nutrition care plan  Assessment:   Diet advanced to Low Phosphorus on 6/30. Currently eating 50% of meals. Attempted to talk to pt, however somnolent at this time. Sister at bedside reports pt with good appetite at this time.  Per nephrologist, pt to continue with CAPD regimen of 6 exchanges of 2.5 liters of 2.5% solution daily. Dialysate from peritoneal dialysis is contributing around 830 kcals/day.  Noted report of suspected noncompliance with home regimen of PD.  Diet Order:  Low Phosphorus  Meds: Scheduled Meds:   . ipratropium  0.5 mg Nebulization Q6H   And  . albuterol  2.5 mg Nebulization Q6H  . antiseptic oral rinse  15 mL Mouth Rinse BID  . aspirin  325 mg Oral Daily  . calcitRIOL  0.25 mcg Oral Custom  . darbepoetin (ARANESP) injection - DIALYSIS  150 mcg Subcutaneous Q Thu-HD  . dialysis solution for CAPD/CCPD [facility])   Peritoneal Dialysis Daily  . famotidine  20 mg Oral Daily  . fentaNYL  25 mcg Transdermal Q72H  . ferric gluconate (FERRLECIT/NULECIT) IV  125 mg Intravenous Q T,Th,Sa-HD  . multivitamin  1 tablet Oral Daily  . sevelamer  2,400 mg Oral TID WC  . sodium chloride  500 mL Intravenous Once  . sodium chloride  3 mL Intravenous Q12H  . DISCONTD: ceFEPime (MAXIPIME) IV  2 g Intravenous Q48H   Continuous Infusions:   . dialysis solution 2.5% low-MG/low-CA 3,000 mL (09/19/11 0655)  . heparin 1,500 Units (09/18/11 2136)   PRN Meds:.sodium chloride, acetaminophen, albuterol, bisacodyl, HYDROcodone-acetaminophen, ondansetron (ZOFRAN) IV, phenol, sevelamer, sodium chloride, sodium chloride, DISCONTD: fentaNYL, DISCONTD: hydrALAZINE, DISCONTD: LORazepam, DISCONTD: metoprolol, DISCONTD: sevelamer  Labs:  CMP     Component Value Date/Time   NA 138 09/19/2011 0435   K 4.1 09/19/2011 0435   CL 87* 09/19/2011 0435    CO2 25 09/19/2011 0435   GLUCOSE 125* 09/19/2011 0435   BUN 93* 09/19/2011 0435   CREATININE 18.06* 09/19/2011 0435   CALCIUM 9.3 09/19/2011 0435   CALCIUM 8.0* 12/04/2009 0455   PROT 6.5 09/08/2011 1343   ALBUMIN 3.3* 09/19/2011 0435   AST 29 09/08/2011 1343   ALT 26 09/08/2011 1343   ALKPHOS 48 09/08/2011 1343   BILITOT 0.3 09/08/2011 1343   GFRNONAA 3* 09/19/2011 0435   GFRAA 3* 09/19/2011 0435   Phosphorus  Date/Time Value Range Status  09/19/2011  4:35 AM 10.0* 2.3 - 4.6 mg/dL Final   Magnesium  Date/Time Value Range Status  09/18/2011  3:55 AM 2.5  1.5 - 2.5 mg/dL Final     Intake/Output Summary (Last 24 hours) at 09/19/11 1428 Last data filed at 09/19/11 1351  Gross per 24 hour  Intake   2463 ml  Output    100 ml  Net   2363 ml    Weight Status:  133 kg, wt down 3.3 kg x 5 days  Estimated needs (using standard body weight of ~92 kg): 2600-2700 kcals, 120-140 grams protein daily  Nutrition Dx: Inadequate oral intake now r/t variable appetite AEB variable meal completion.  Goal:   Intake to meet 90-100% of estimated nutrition needs, unmet.  Monitor:  PO intake, labs, renal function, weight trend  Jarold Motto MS, RD, LDN Pager: [phone] After-hours pager: [phone]

## 2011-09-19 NOTE — Progress Notes (Addendum)
Physical Therapy Treatment Patient Details Name: DAYVON DAX MRN: [id number] DOB: [birth year] Today's Date: 09/19/2011 Time: 1500-1510 PT Time Calculation (min): 10 min  PT Assessment / Plan / Recommendation Comments on Treatment Session  PAtient s/p Respiratory failure with decr mobility secondary to few days of VDRF.  Will benefit from PT to address balance and gait deficits.  Limited treatment due to patient getting ready to have peritoneal dialysis.      Follow Up Recommendations  Home health PT;Supervision/Assistance - 24 hour    Barriers to Discharge  None      Equipment Recommendations  None recommended by PT    Recommendations for Other Services  None  Frequency Min 3X/week   Plan Discharge plan remains appropriate;Frequency remains appropriate    Precautions / Restrictions Precautions Precautions: Fall Precaution Comments: peritoneal dialysis, O2 Restrictions Weight Bearing Restrictions: No   Pertinent Vitals/Pain VSS, No pain    Mobility  Bed Mobility Bed Mobility: Not assessed Transfers Transfers: Sit to Stand;Stand to Sit;Stand Pivot Transfers Sit to Stand: 5: Supervision;With upper extremity assist;From bed Stand to Sit: 5: Supervision;With upper extremity assist;With armrests;To chair/3-in-1 Stand Pivot Transfers: 5: Supervision;With armrests Details for Transfer Assistance: cueing for hand placement and safety.  Got to chair with PT assistance so he can have peritoneal dialysis.  Therefore PT could not ambulate with patient as nurse was ready to connect patient to dialysis.  Ambulation/Gait Ambulation/Gait Assistance: Not tested (comment) Stairs: No Wheelchair Mobility Wheelchair Mobility: No     PT Goals Acute Rehab PT Goals PT Goal: Supine/Side to Sit - Progress: Progressing toward goal PT Goal: Sit to Stand - Progress: Progressing toward goal PT Goal: Stand to Sit - Progress: Progressing toward goal  Visit Information  Last PT Received On:  09/19/11 Assistance Needed: +1    Subjective Data  Subjective: "I need to get in this chair.  I have to do my dialysis."   Cognition  Overall Cognitive Status: Appears within functional limits for tasks assessed/performed Arousal/Alertness: Awake/alert Orientation Level: Appears intact for tasks assessed Behavior During Session: [facility] for tasks performed    Balance  Static Sitting Balance Static Sitting - Balance Support: Bilateral upper extremity supported;Feet supported Static Sitting - Level of Assistance: 6: Modified independent (Device/Increase time) Static Sitting - Comment/# of Minutes: 3  End of Session PT - End of Session Equipment Utilized During Treatment: Gait belt Activity Tolerance: Patient tolerated treatment well Patient left: in chair;with call bell/phone within reach;with family/visitor present;with nursing in room Nurse Communication: Mobility status       INGOLD,Kanden Carey 09/19/2011, 3:30 PM [facility] Acute Rehabilitation [phone] (pager)

## 2011-09-19 NOTE — Progress Notes (Signed)
[facility]  [place] TEAM 1 - Stepdown/ICU TEAM  PCP: Geraldo Pitter, MD  Interval History: Patient has history of HTN and ESRD on PD. In the past patient had received his care at [facility] and had been hospitalized there about a year ago with fluid overload and possible CHF. Recently he been going to a renal doctor in [facility]). He gets peritoneal dialysis 4 times a day. He has noticed a gain of about 20 pounds recently and started to have worsening shortness of breath for past 24 hours. He presented to [facility] emergency department and was found to be fluid overloaded. Hospitalist was called to admit. Dr. Allena Katz with renal recommended transferred to [facility]. On 6/20 he developed worsening resp distress with bilat infiltrates most c/w edema. He failed NPPV and required intubation. He was extubated on 6/26. It was later discovered that the patient was not compliant with peritoneal dialysis at home.  Subjective: Alert although voice is hoarse and raspy postextubation. Patient is adamant about not wearing CPAP and indicates he will not wear this after returning to home. Currently denies shortness of breath or any chest discomfort. He is sitting on the side of the bed eating breakfast.  Objective: Blood pressure 82/66, pulse 94, temperature 98.7 F (37.1 C), temperature source Oral, resp. rate 25, height 6\' 2"  (1.88 m), weight 133 kg (293 lb 3.4 oz), SpO2 96.00%.  Intake/Output from previous day: 06/30 0701 - 07/01 0700 In: 2023 [P.O.:2020; I.V.:3] Out: 150 [Urine:150] Intake/Output this shift: Total I/O In: 123 [P.O.:120; I.V.:3] Out: -   General appearance: alert, cooperative, appears older than stated age, no distress  Resp: clear to auscultation bilaterally with a few bibasilar crackles, distant breath sounds, 2 L nasal cannula oxygen; vocalization remains hoarse and raspy Cardio: regular rate and rhythm, S1, S2 normal, no murmur, click, rub or gallop GI: soft and  nondistended, non-tender; bowel sounds normal; no masses,  no organomegaly Extremities: extremities normal, atraumatic, no cyanosis or edema Neurologic: Grossly normal  Lab Results:  Basename 09/19/11 0435 09/18/11 0355  WBC 11.6* 11.7*  HGB 10.3* 11.0*  HCT 33.6* 34.2*  PLT 314 323   BMET  Basename 09/19/11 0435 09/18/11 0355  NA 138 137  K 4.1 4.2  CL 87* 87*  CO2 25 24  GLUCOSE 125* 130*  BUN 93* 85*  CREATININE 18.06* 16.79*  CALCIUM 9.3 9.4    Studies/Results: Dg Chest Port 1 View  09/18/2011  *RADIOLOGY REPORT*  Clinical Data: Evaluate for pneumonia.  PORTABLE CHEST - 1 VIEW  Comparison: Chest x-ray 09/17/2011.  Findings: Lung volumes are low.  There are bibasilar opacities (left greater than right), favored to predominately reflect atelectasis, although superimposed air space consolidation (particularly in the left lower lobe) is difficult to exclude. Possible small left-sided pleural effusion.  Pulmonary venous congestion (likely accentuated by low lung volumes), without frank pulmonary edema.  Heart size is mildly enlarged (unchanged). Mediastinal contours are unremarkable.  IMPRESSION: 1.  Low lung volumes with bibasilar opacities (left greater than right) which it is favored to represent areas of atelectasis, however, underlying airspace consolidation from aspiration or infection is difficult to entirely exclude (particularly in the left lower lobe). 2.  Possible small left pleural effusion. 3.  Mild cardiomegaly is unchanged.  Original Report Authenticated By: Florencia Reasons, M.D.   Medications: I have reviewed the patient's current medications.  Assessment/Plan:  Acute respiratory failure with hypoxia and hypercarbia *Multifactorial etiology related to acute systolic heart failure brought  about by noncompliance with dialysis as well as a degree of right-sided heart failure from untreated obstructive sleep apnea *Continue nasal cannula oxygen and treat underlying  causes  Hypotension *Likely related to volume depletion post aggressive volume removal for acute heart failure during this admission *It is noted that this patient's weight has decreased from 151.3 kg at presentation to 133 kg *Blood pressure in both arms has consistently been in the 70s today therefore all narcotics and antihypertensive medications were placed on hold *Urine output has also been low so we will go ahead and give a 500 cc normal saline bolus and watch blood pressure response *May need to consider adding midodrine  Post-extubation stridor and horseness  *Tolerating diet *Unclear if would benefit from a trial of pulse steroids for edema *Postulate has 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 *Echocardiogram from 2011 demonstrated mild systolic dysfunction with inferior hypokinesis with moderate dilatation and moderate LVH  Elevated CPK: myositis vs rhabdomyolosis *Resolved  Thrombocytopenia *Resolved-no evidence of gram-negative infection  CKD (chronic kidney disease) requiring chronic PERITONEAL dialysis *Appreciate nephrology assistant *Currently peritoneal dialysis being cycled 6 times daily and at home was supposed to be 4 cycles daily *perhaps HD would be better tolerated and lead to improve compliance  HTN (hypertension) *Blood pressure actually soft with systolic blood pressures in the 90s with appropriate reduction in fluid volume with peritoneal dialysis  Right heart failure/Pulmonary HTN/ Mitral regurgitation (MODERATE) *This is also associated with  moderate to severe tricuspid regurgitation as well as moderate to severely dilated left/right atrium and mildly dilated right ventricle which is c/w underlying right heart failure-measure 50 mmHg in 2011 *Pulmonary hypertension has progressed in one year-now measuring 60 mmHg based on echocardiogram this admission *pt has been warned (in very clear terms - "not using CPAP will shorten your llfe") that this will worsen without CPAP use, but he makes it clear in a very animated and negative fashion that he has no interest in using CPAP, here or at home   LVH (left ventricular hypertrophy) MODERATE *No evidence of diastolic dysfunction on recent echocardiogram  Anemia *Primarily mediated by chronic kidney disease *Continue Aranesp *Would like to keep hemoglobin greater than 7 if possible - transfuse as needed  Obesity with BMI of 37.6, adult/OSA/OHS  *Pulmonary suspects the patient has underlying obesity hypoventilation syndrome as well as sleep apnea *Pulmonary recommends formal sleep study as an outpatient but pt states very clearly that he is not interested in a sleep study or CPAP   Disposition *Initially we had planned for transfer to 6700 but with hypotension we will place transfer on hold for now until blood pressure rebounds   LOS: 12 days   Junious Silk, ANP pager [phone]  [facility] 1 [email or web address] Password: [email or web address]  09/19/2011, 11:03 AM  I have personally examined this patient and reviewed the entire database. I have reviewed the above note, made any necessary editorial changes, and agree with its content.  Lonia Blood, MD [facility]

## 2011-09-20 ENCOUNTER — Inpatient Hospital Stay (HOSPITAL_COMMUNITY): Payer: Medicare Other

## 2011-09-20 DIAGNOSIS — R410 Disorientation, unspecified: Secondary | ICD-10-CM | POA: Diagnosis not present

## 2011-09-20 LAB — RENAL FUNCTION PANEL
Albumin: 3.3 g/dL — ABNORMAL LOW (ref 3.5–5.2)
CO2: 23 mEq/L (ref 19–32)
Calcium: 9.3 mg/dL (ref 8.4–10.5)
Chloride: 87 mEq/L — ABNORMAL LOW (ref 96–112)
Creatinine, Ser: 17.63 mg/dL — ABNORMAL HIGH (ref 0.50–1.35)
GFR calc Af Amer: 3 mL/min — ABNORMAL LOW (ref >90)
GFR calc non Af Amer: 3 mL/min — ABNORMAL LOW (ref >90)
Sodium: 136 mEq/L (ref 135–145)

## 2011-09-20 LAB — CULTURE, BLOOD (ROUTINE X 2): Culture: NO GROWTH

## 2011-09-20 MED ORDER — DELFLEX-LC/1.5% DEXTROSE 346 MOSM/L IP SOLN: INTRAPERITONEAL | Status: DC

## 2011-09-20 MED ORDER — SORBITOL 70 % SOLN
30.0000 mL | Freq: Once | Status: DC
  Filled 2011-09-20: qty 30

## 2011-09-20 MED ORDER — SODIUM CHLORIDE 0.9 % IV BOLUS (SEPSIS)
500.0000 mL | Freq: Once | INTRAVENOUS | Status: AC
  Administered 2011-09-20: 500 mL via INTRAVENOUS

## 2011-09-20 MED ORDER — MIDODRINE HCL 5 MG PO TABS
5.0000 mg | ORAL_TABLET | Freq: Three times a day (TID) | ORAL | Status: DC
  Administered 2011-09-20 – 2011-09-23 (×8): 5 mg via ORAL
  Filled 2011-09-20 (×12): qty 1

## 2011-09-20 MED ORDER — METHYLPREDNISOLONE SODIUM SUCC 125 MG IJ SOLR
80.0000 mg | Freq: Once | INTRAMUSCULAR | Status: AC
  Administered 2011-09-20: 80 mg via INTRAVENOUS
  Filled 2011-09-20: qty 2

## 2011-09-20 MED ORDER — WHITE PETROLATUM GEL
Status: AC
  Administered 2011-09-20
  Filled 2011-09-20: qty 5

## 2011-09-20 MED ORDER — SODIUM CHLORIDE 0.9 % IV BOLUS (SEPSIS): 500.0000 mL | Freq: Once | INTRAVENOUS | Status: DC

## 2011-09-20 NOTE — Progress Notes (Signed)
Physical Therapy Treatment Patient Details Name: Gary Rivera MRN: [id number] DOB: 1960-07-20 Today's Date: 09/20/2011 Time: 4098-1191 PT Time Calculation (min): 39 min  PT Assessment / Plan / Recommendation Comments on Treatment Session  Patient s/p respiratory failure with decr mobility secondary to infection and VDRF.  Will benefit from PT to progress mobility.    Follow Up Recommendations  Home health PT;Supervision/Assistance - 24 hour    Barriers to Discharge  None      Equipment Recommendations  None recommended by PT    Recommendations for Other Services OT consult  Frequency Min 3X/week   Plan Discharge plan remains appropriate;Frequency remains appropriate    Precautions / Restrictions Precautions Precautions: Fall Precaution Comments: peritoneal dialysis, O2 Restrictions Weight Bearing Restrictions: No   Pertinent Vitals/Pain VSS, No pain    Mobility  Bed Mobility Bed Mobility: Sit to Supine Supine to Sit: Not tested (comment) Sit to Supine: 5: Supervision Transfers Transfers: Sit to Stand;Stand to Sit Sit to Stand: 4: Min assist;With upper extremity assist;From chair/3-in-1;With armrests Stand to Sit: With upper extremity assist;With armrests;To chair/3-in-1;4: Min assist Stand Pivot Transfers: Not tested (comment) Details for Transfer Assistance: cuing for hand placement and safety.  Moves impulsively at times.   Ambulation/Gait Ambulation/Gait Assistance: 4: Min assist Ambulation Distance (Feet): 110 Feet Assistive device: Rolling walker Ambulation/Gait Assistance Details: Ambulated 55 feet and rested in chair and then ambulated another 55 feet.  Followed with chair.  Needed cues to stay close to RW and for upright posture and sequencing RW.   Gait Pattern: Step-through pattern;Decreased stride length Gait velocity: decreased Stairs: No Wheelchair Mobility Wheelchair Mobility: No    PT Goals Acute Rehab PT Goals PT Goal: Supine/Side to Sit -  Progress: Progressing toward goal PT Goal: Sit to Stand - Progress: Progressing toward goal PT Goal: Stand to Sit - Progress: Progressing toward goal PT Goal: Ambulate - Progress: Progressing toward goal  Visit Information  Last PT Received On: 09/20/11 Assistance Needed: +1    Subjective Data  Subjective: "I have been wanting to walk."   Cognition  Overall Cognitive Status: Appears within functional limits for tasks assessed/performed Arousal/Alertness: Awake/alert Orientation Level: Appears intact for tasks assessed Behavior During Session: [facility] for tasks performed    Balance  Static Sitting Balance Static Sitting - Level of Assistance: Not tested (comment)  End of Session PT - End of Session Equipment Utilized During Treatment: Gait belt Activity Tolerance: Patient limited by fatigue Patient left: with call bell/phone within reach;in bed;with family/visitor present Nurse Communication: Mobility status       INGOLD,Jaanai Salemi 09/20/2011, 4:25 PM  [facility] [phone] (pager)

## 2011-09-20 NOTE — Progress Notes (Signed)
7/2  1500  Patient's bp noted to be 69/50 - patient in the chair and feeling a symptomatic.  bp re check with his feet up 87/50.  Dr Lowell Guitar aware and patient drained of fluid in his belly and left dry.  This last exchange was 'even' meaning no fluid was removed and none was added.  Patient left without any fluid in his abdomen to allow his bp to stabilize.  A 500cc bolus was ordered and given per the 2600 rn.  Will attempt another exchange at 8pm.  Mervin Hack rn

## 2011-09-20 NOTE — Progress Notes (Addendum)
Blood gas report phoned to Dr. Butler Denmark. Orders  For lactic acid to be drawn. Ph 7.2 pco2 50.2 po2 96

## 2011-09-20 NOTE — Progress Notes (Addendum)
[facility]  [place] TEAM 1 - Stepdown/ICU TEAM  PCP: Geraldo Pitter, MD  Interval History: Patient has history of HTN and ESRD on PD. In the past patient had received his care at [facility] and had been hospitalized there about a year ago with fluid overload and possible CHF. Recently he been going to a renal doctor in [facility]). He gets peritoneal dialysis 4 times a day. He has noticed a gain of about 20 pounds recently and started to have worsening shortness of breath for past 24 hours. He presented to [facility] emergency department and was found to be fluid overloaded. Hospitalist was called to admit. Dr. Allena Katz with renal recommended transferred to [facility]. On 6/20 he developed worsening resp distress with bilat infiltrates most c/w edema. He failed NPPV and required intubation. He was extubated on 6/26. It was later discovered that the patient was not compliant with peritoneal dialysis at home.  Subjective: Alert although voice is hoarse and raspy postextubation. Patient is adamant about not wearing CPAP and indicates he will not wear this after returning to home. Complaining of difficulty breathing and points to his neck. When discussing CAPD versus HD patient is confusing symptoms of vocal cord edema/dysfunction with volume overload related to noncompliance/inadequate CAPD volume reduction prior to admission. Nephrology note reviewed and we also discussed with the patient the benefits of progressing to HD for better volume removal given his multiple medical problems.   Objective: Blood pressure 91/75, pulse 97, temperature 98.2 F (36.8 C), temperature source Oral, resp. rate 22, height 6\' 2"  (1.88 m), weight 133 kg (293 lb 3.4 oz), SpO2 98.00%.  Intake/Output from previous day: 07/01 0701 - 07/02 0700 In: 1123 [P.O.:620; I.V.:3; IV Piggyback:500] Out: 100 [Urine:100] Intake/Output this shift:    General appearance: alert, cooperative, appears older than stated age,  no distress  Resp: clear to auscultation bilaterally with a few bibasilar crackles, distant breath sounds, 2 L nasal cannula oxygen; vocalization remains hoarse and raspy Cardio: regular rate and rhythm, S1, S2 normal, no murmur, click, rub or gallop GI: soft and nondistended, non-tender; bowel sounds normal; no masses,  no organomegaly Extremities: extremities normal, atraumatic, no cyanosis or edema Neurologic: Grossly normal  Lab Results:  Basename 09/19/11 0435 09/18/11 0355  WBC 11.6* 11.7*  HGB 10.3* 11.0*  HCT 33.6* 34.2*  PLT 314 323   BMET  Basename 09/20/11 0411 09/19/11 0435  NA 136 138  K 4.0 4.1  CL 87* 87*  CO2 23 25  GLUCOSE 135* 125*  BUN 94* 93*  CREATININE 17.63* 18.06*  CALCIUM 9.3 9.3    Studies/Results: No results found. Medications: I have reviewed the patient's current medications.  Assessment/Plan:  Acute respiratory failure with hypoxia and hypercarbia *Multifactorial etiology related to acute systolic heart failure brought about by noncompliance with dialysis as well as a degree of right-sided heart failure from untreated obstructive sleep apnea *Continue nasal cannula oxygen and treat underlying causes *Check PCXR  Hypotension *Improved earlier today a recent systolic blood pressure reading now down to 65 *Orthostatic vital signs show 20 mmHg drop from lying to sitting *We'll go ahead and give another 500 cc of saline for now *We'll check a cortisol level to make sure he has appropriate response to stress or does not have adrenal insufficiency *We'll go ahead and begin midodrine *Likely related to volume depletion post aggressive volume removal for acute heart failure during this admission *It is noted that this patient's weight has decreased from 151.3  kg at presentation to 133 kg  Acute delirium *Unclear if related to solitary dose of Solu-Medrol given earlier today or a complication of his persistent hypotension *Also has h/o hypercarbic  respiratory failure this admit and has not been wearing his CPAP so will check an ABG *Sitter at the bedside *We'll try to correct hypotension and see if this improves his mentation  Post-extubation stridor and horseness  *Tolerating diet *We'll give a dose of IV Solu-Medrol today to see if symptoms improve *Postulate has 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 PNA  *Procalcitonin is trending therefore  Maxipime discontinued on 7/1  Systolic congestive heart failure with reduced left ventricular function, NYHA class 1- EF 40-45% 2011 ECHO *Echocardiogram this admission demonstrates stable EF of 40-45% with progression to mild diffuse hypokinesis (see below)-all of his consistent with progressive dilated cardiomyopathy *Echocardiogram from 2011 demonstrated mild systolic dysfunction with inferior hypokinesis with moderate dilatation and moderate LVH  Elevated CPK: myositis vs rhabdomyolosis *Resolved  Thrombocytopenia *Resolved-no evidence of gram-negative infection  CKD (chronic kidney disease) requiring chronic PERITONEAL dialysis *Appreciate nephrology assistant *Currently peritoneal dialysis being cycled 6 times daily and at home was supposed to be 4 cycles daily *perhaps HD would be better tolerated and lead to improve compliance  HTN (hypertension) *Blood pressure actually soft with systolic blood pressures in the 90s with appropriate reduction in fluid volume with peritoneal dialysis  Right heart failure/Pulmonary HTN/ Mitral regurgitation (MODERATE) *This is also associated with moderate to severe tricuspid regurgitation as well as moderate to severely dilated left/right atrium and mildly dilated right ventricle which is c/w underlying right heart failure-measure 50 mmHg in 2011 *Pulmonary hypertension has progressed in one year-now measuring 60 mmHg based on echocardiogram this admission *pt has been warned (in very clear terms - "not  using CPAP will shorten your llfe") that this will worsen without CPAP use, but he makes it clear in a very animated and negative fashion that he has no interest in using CPAP, here or at home   LVH (left ventricular hypertrophy) MODERATE *No evidence of diastolic dysfunction on recent echocardiogram  Anemia *Primarily mediated by chronic kidney disease *Continue Aranesp *Would like to keep hemoglobin greater than 7 if possible - transfuse as needed  Obesity with BMI of 37.6, adult/OSA/OHS  *Pulmonary suspects the patient has underlying obesity hypoventilation syndrome as well as sleep apnea *Pulmonary recommends formal sleep study as an outpatient but pt states very clearly that he is not interested in a sleep study or CPAP   Disposition *Remain in step down since blood pressure remains tenuous   LOS: 13 days   Junious Silk, ANP pager [phone]  [facility] [email or web address] Password: [email or web address]  09/20/2011, 11:31 AM  I have examined the patient and reviewed the chart. ABG reveals metabolic acidosis. Ordering a Lactic acid- if high will cont fluid boluses and ask PCCM to consult for pressors. The above note has been modified. I agree with it.   Calvert Cantor, MD [phone]

## 2011-09-20 NOTE — Progress Notes (Signed)
Assessment/Plan:  1. CHF/volume overload-resolved 2. ESRD - Increase to 3000cc exchanges, 1.5% today, 4x/day. I wonder if he is not able to clear adequately with CAPD.   This is a poor start to peritoneal  dialysis. I think he needs an AV access. May defer to his primary nephrologist, unless worsens. 3. Anemia- on aranesp.  4. Secondary hyperparathyroidism- continue calcitriol and renvela plus renavite .  5. HTN/volume- much improved.BP low. Off Fentanyl now. Low BP suggest below EDW, especially with pulmonary hypertension 6. Respiratory failure- Prior hx OSA/BIPAP  7. Pulmonary hypertension by 2D echo 6/13  Subjective: Interval History: Poorly functioning catheter with poor flows. Low BP due to UF  Objective: Vital signs in last 24 hours: Temp:  [98.1 F (36.7 C)-99 F (37.2 C)] 98.2 F (36.8 C) (07/02 0756) Pulse Rate:  [87-99] 97  (07/02 0756) Resp:  [15-25] 22  (07/02 0756) BP: (70-105)/(40-76) 91/75 mmHg (07/02 0846) SpO2:  [88 %-98 %] 98 % (07/02 0756) Weight change:   Intake/Output from previous day: 07/01 0701 - 07/02 0700 In: 1123 [P.O.:620; I.V.:3; IV Piggyback:500] Out: 100 [Urine:100] Intake/Output this shift:   Head: Normocephalic, without obvious abnormality, atraumatic Lungs wheezes and rhonchi bilateral Cor RRR Abd soft, obese Extrem tr edema  Lab Results:  Basename 09/19/11 0435 09/18/11 0355  WBC 11.6* 11.7*  HGB 10.3* 11.0*  HCT 33.6* 34.2*  PLT 314 323   BMET:  Basename 09/20/11 0411 09/19/11 0435  NA 136 138  K 4.0 4.1  CL 87* 87*  CO2 23 25  GLUCOSE 135* 125*  BUN 94* 93*  CREATININE 17.63* 18.06*  CALCIUM 9.3 9.3   No results found for this basename: PTH:2 in the last 72 hours Iron Studies: No results found for this basename: IRON,TIBC,TRANSFERRIN,FERRITIN in the last 72 hours Studies/Results: No results found.  Scheduled:   . antiseptic oral rinse  15 mL Mouth Rinse BID  . aspirin  325 mg Oral Daily  . calcitRIOL  0.25 mcg Oral  Custom  . darbepoetin (ARANESP) injection - DIALYSIS  150 mcg Subcutaneous Q Thu-HD  . dialysis solution for CAPD/CCPD [facility])   Peritoneal Dialysis Daily  . famotidine  20 mg Oral Daily  . feeding supplement  1 Container Oral Q24H  . ferric gluconate (FERRLECIT/NULECIT) IV  125 mg Intravenous Q T,Th,Sa-HD  . methylPREDNISolone (SOLU-MEDROL) injection  80 mg Intravenous Once  . multivitamin  1 tablet Oral Daily  . sevelamer  2,400 mg Oral TID WC  . sodium chloride  500 mL Intravenous Once  . sodium chloride  3 mL Intravenous Q12H  . sorbitol  30 mL Oral Once  . DISCONTD: albuterol  2.5 mg Nebulization Q6H  . DISCONTD: ceFEPime (MAXIPIME) IV  2 g Intravenous Q48H  . DISCONTD: fentaNYL  25 mcg Transdermal Q72H  . DISCONTD: ipratropium  0.5 mg Nebulization Q6H       LOS: 13 days   Shaquan Missey C 09/20/2011,10:14 AM

## 2011-09-20 NOTE — Progress Notes (Signed)
Pt. Refused cpap. 

## 2011-09-21 LAB — POCT I-STAT 3, ART BLOOD GAS (G3+)
Acid-base deficit: 3 mmol/L — ABNORMAL HIGH (ref 0.0–2.0)
Patient temperature: 98.4
pH, Arterial: 7.289 — ABNORMAL LOW (ref 7.350–7.450)

## 2011-09-21 LAB — BODY FLUID CELL COUNT WITH DIFFERENTIAL

## 2011-09-21 LAB — RENAL FUNCTION PANEL
Albumin: 3.1 g/dL — ABNORMAL LOW (ref 3.5–5.2)
BUN: 99 mg/dL — ABNORMAL HIGH (ref 6–23)
CO2: 24 mEq/L (ref 19–32)
Chloride: 86 mEq/L — ABNORMAL LOW (ref 96–112)
Creatinine, Ser: 19.68 mg/dL — ABNORMAL HIGH (ref 0.50–1.35)
GFR calc non Af Amer: 2 mL/min — ABNORMAL LOW (ref >90)
Potassium: 4.3 mEq/L (ref 3.5–5.1)

## 2011-09-21 LAB — PATHOLOGIST SMEAR REVIEW

## 2011-09-21 LAB — CORTISOL: Cortisol, Plasma: 9.9 ug/dL

## 2011-09-21 NOTE — Progress Notes (Signed)
09/21/11  Department Director Note 315 pm  Called to patients room to speak with patient due to patient stating that he did not want Dr. Lowell Guitar as his Nephrologist anymore. Spoke with patient. Patient stated that he did not want Dr. Lowell Guitar as his Nephrologist but would agree to being seen by someone else in the group. Explained to patient that Dr. Lowell Guitar is the MD covering in the hospital this week.  Called and spoke with Dr. Lowell Guitar to make aware of the above. Dr. Lowell Guitar stated that he nor [facility] will no longer be covering patient.   Paged and spoke with Junious Silk, NP to make aware that patient would need a new Nephrologist and discussed the above. Revonda Standard stated she would consult with Dr. Donette Larry regarding calling Dr. Bascom Levels to cover as the Nephrologist for this patient.  Will discuss the above with patient. Kaymon Denomme, Charlyne Quale

## 2011-09-21 NOTE — Progress Notes (Signed)
Clinical Social Worker received additional referral requesting to meet with pt to discuss barriers to medical compliance.  CSW met with pt at length at bedside.  CSW introduced self and explained role.  CSW provided opportunity for pt to process feelings.  Pt shared that he is not against a shunt for dialysis but would like to try peritoneal dialysis with a home health RN first.  Pt openly acknowledged that he was not successful with this prior to admission but feels renewed motivation to try this option.  Pt also shared that his mother passed away during a dialysis session and pt feels that this was due to complications from her shunt.  Pt stated, "She died because of that and we have similar bodies".  CSW noted that pt utilized present tense and used this as an opportunity to introduce grief counseling.  Pt again shared that he and his mother were close and he feels grief from her loss.  Pt treasures this relationship.  CSW acknowledged the difficulty of losing a loved one, especially during formative years (pt was 51 years old).  CSW and pt processed ways to assist with ongoing coping of this loss; speak with a therapist, write down positive memories/experiences.  CSW normalized grieving process.  Pt is open to speaking with someone and acknowledged that if he has to have a shunt, speaking with a therapist would be beneficial.  CSW thanked pt for his openness and willingness to communicate and encouraged pt to consider sharing this information with his current MDs to assist with plan of care.  CSW to update RNCM and unit CSW.    Angelia Mould, MSW, [place] [phone]

## 2011-09-21 NOTE — Progress Notes (Signed)
Patient complaining of abdominal discomfort during PD exchange.  MD notified and orders received.  PD fluid cell count and culture sent. Order changed to 2750cc per exchange.

## 2011-09-21 NOTE — Progress Notes (Signed)
[facility]  Agency TEAM 1 - Stepdown/ICU TEAM  PCP: Geraldo Pitter, MD  Interval History: Patient has history of HTN and ESRD on PD. In the past patient had received his care at [facility] and had been hospitalized there about a year ago with fluid overload and possible CHF. Recently he been going to a renal doctor in [facility]). He gets peritoneal dialysis 4 times a day. He has noticed a gain of about 20 pounds recently and started to have worsening shortness of breath for past 24 hours. He presented to [facility] emergency department and was found to be fluid overloaded. Hospitalist was called to admit. Dr. Allena Katz with renal recommended transferred to [facility]. On 6/20 he developed worsening resp distress with bilat infiltrates most c/w edema. He failed NPPV and required intubation. He was extubated on 6/26. It was later discovered that the patient was not compliant with peritoneal dialysis at home.  Subjective: Continues with raspy voice but no definitive stridor. No respiratory distress. Denies chest pain. He is much more alert animated and appropriate today. He is laughing and joking with his nurse. No other complaints verbalize.  Objective: Blood pressure 109/76, pulse 88, temperature 98.4 F (36.9 C), temperature source Oral, resp. rate 18, height 6\' 2"  (1.88 m), weight 134 kg (295 lb 6.7 oz), SpO2 97.00%.  Intake/Output from previous day: 07/02 0701 - 07/03 0700 In: 120 [P.O.:120] Out: 1 [Stool:1] Intake/Output this shift:   General appearance: alert,  no distress  Resp: clear to auscultation bilaterally with a few bibasilar crackles, distant breath sounds, 3 L nasal cannula oxygen; vocalization remains hoarse and raspy Cardio: regular rate and rhythm, S1, S2 normal, no murmur, click, rub or gallop GI: soft and nondistended, non-tender; bowel sounds normal; no masses,  no organomegaly Extremities: extremities normal, atraumatic, no cyanosis or edema Neurologic:  Grossly normal  Lab Results:  [facility] 09/19/11 0435  WBC 11.6*  HGB 10.3*  HCT 33.6*  PLT 314   BMET  Basename 09/21/11 0425 09/20/11 0411  NA 134* 136  K 4.3 4.0  CL 86* 87*  CO2 24 23  GLUCOSE 144* 135*  BUN 99* 94*  CREATININE 19.68* 17.63*  CALCIUM 8.9 9.3    Studies/Results: Dg Chest Port 1 View  09/20/2011  *RADIOLOGY REPORT*  Clinical Data: Hypoxia. Shortness of breath.  PORTABLE CHEST - 1 VIEW  Comparison: 09/18/2011.  Findings: Poor inspiratory portable examination.  Cardiomegaly.  Central pulmonary vascular prominence.  Limited evaluation left lung base.  No gross pneumothorax.  Mild tortuosity descending thoracic aorta.  IMPRESSION: Cardiomegaly with central pulmonary vascular prominence.  Original Report Authenticated By: Fuller Canada, M.D.   Medications: I have reviewed the patient's current medications.  Assessment/Plan:  Acute respiratory failure with hypoxia and hypercarbia *Multifactorial etiology related to acute systolic heart failure brought about by noncompliance with dialysis as well as a degree of right-sided heart failure from untreated obstructive sleep apnea *Continue nasal cannula oxygen and treat underlying causes * PCXR performed 09/20/2011 demonstrated stable cardiomegaly with central pulmonary vascular prominence  Hypotension *Resolved-was started on midodrine on 09/20/2011 *Orthostatic vital signs did demonstrate a 20 mmHg drop from lying to sitting *Over a period of 48 hours did require 2 separate 500 cc normal saline challenges *Cortisol level within normal limits *Likely related to volume depletion post aggressive volume removal for acute heart failure during this admission *It is noted that this patient's weight has decreased from 151.3 kg at presentation to 133 kg  Acute  delirium *Resolved after correction of hypotension *Unclear if related to solitary dose of Solu-Medrol given earlier 09/20/2011 or was a complication of his  persistent hypotension *Also has h/o hypercarbic respiratory failure this admit and has not been wearing his CPAP -an ABG was obtained on 09/20/2011 which demonstrated stable hypercarbia with a PCO2 of 50 which is at this patient's baseline. He had associated pH around 7.2 which is more consistent with medical acidosis related to his chronic renal failure *Briefly required a Sitter at the bedside  Post-extubation stridor and horseness  *Tolerating diet *A solitary dose of IV Solu-Medrol demonstrated no significant improvement in patient's symptoms although the stridor portion seems to have decreased *Postulate has 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 *Echocardiogram from 2011 demonstrated mild systolic dysfunction with inferior hypokinesis with moderate dilatation and moderate LVH  Elevated CPK: myositis vs rhabdomyolosis *Resolved  Thrombocytopenia *Resolved-no evidence of gram-negative infection  CKD (chronic kidney disease) requiring chronic PERITONEAL dialysis *Appreciate nephrology assistant *Currently peritoneal dialysis being cycled 6 times daily and at home was supposed to be 4 cycles daily-nephrology doesn't feel he is able to clear adequately with CAPD *perhaps HD would be better tolerated and lead to improve compliance-both the hospitalist service as well as the nephrology team have attempted to discuss this issue with the patient but the patient is very adamant about having no interest in discussing this. Nephrology at this point is deferring this discussion to the patient's  primary nephrologist in [place], [place].  History of HTN (hypertension) *Blood pressure actually soft with systolic blood pressures in the 90s with appropriate reduction in fluid volume with peritoneal dialysis  Right heart failure/Pulmonary HTN/ Mitral regurgitation (MODERATE) *This is also associated with moderate to severe tricuspid regurgitation as well as moderate to severely dilated left/right atrium and mildly dilated right ventricle which is c/w underlying right heart failure-measure 50 mmHg in 2011 *Pulmonary hypertension has progressed in one year-now measuring 60 mmHg based on echocardiogram this admission *pt has been warned (in very clear terms - "not using CPAP will shorten your llfe") that this will worsen without CPAP use, but he makes it clear in a very animated and negative fashion that he has no interest in using CPAP, here or at home   LVH (left ventricular hypertrophy) MODERATE *No evidence of diastolic dysfunction on recent echocardiogram  Anemia *Primarily mediated by chronic kidney disease *Continue Aranesp *Would like to keep hemoglobin greater than 7 if possible - transfuse as needed  Obesity with BMI of 37.6, adult/OSA/OHS  *Pulmonary suspects the patient has underlying obesity hypoventilation syndrome as well as sleep apnea *Pulmonary recommends formal sleep study as an outpatient but pt states very clearly that he is not interested in a sleep study or CPAP   Disposition *Transfer to 6700 - discharge home when volume status stable and hoarseness improved   LOS: 14 days   Junious Silk, ANP pager [phone]  [facility] 1 [email or web address] Password: [email or web address]  09/21/2011, 12:25 PM  I have personally examined this patient and reviewed the entire database. I have reviewed the above note, made any necessary editorial changes, and agree with its content.  Lonia Blood,  MD [facility]

## 2011-09-21 NOTE — Progress Notes (Signed)
I have reviewed the note by Ms. Adaline Sill and discussed this case with Junious Silk nurse practitioner. Ms. Rennis Harding contacted Dr. Bascom Levels to request that he provide consultative care for the patient's nephrology issues but unfortunately was informed Dr. Bascom Levels is scheduled to be out of town for the remainder of the week.  Revonda Standard then contacted the [facility] who requested that the rounding doctor in the morning be alerted to the fact that the patient would like to see a physician other than Dr. Lowell Guitar.  Lonia Blood, MD [facility] Office  [phone] Pager [phone]  On-Call/Text Page:      [email or web address]      password [facility]

## 2011-09-21 NOTE — Progress Notes (Signed)
Assessment/Plan:  1. CHF/volume overload-resolved, now probably over ultrafiltrated 2. ESRD - Increased dwell vol to 3000cc exchanges but tolerating 2.75l, will do 1.5% today, 4x/day. I don't think he is able to clear adequately with CAPD. This is a poor start to peritoneal dialysis. I think he needs an AV access but he is not interested in discussing this. Will defer to his primary nephrologist.  3. Anemia- on aranesp.  4. Secondary hyperparathyroidism- continue calcitriol and renvela plus renavite .  5. HTN/volume- much improved.BP low. Low BP suggest below EDW, especially with pulmonary hypertension. On Midodrine. I suspect he will not need this. 6. Respiratory failure- Prior hx OSA/BIPAP    Subjective: Interval History: Ambulated with PT.  Now on Midodrine.  Got IVFs  Objective: Vital signs in last 24 hours: Temp:  [98.1 F (36.7 C)-99.1 F (37.3 C)] 99.1 F (37.3 C) (07/03 0900) Pulse Rate:  [69-94] 93  (07/03 0900) Resp:  [20-25] 25  (07/03 0900) BP: (65-128)/(43-73) 92/66 mmHg (07/03 0900) SpO2:  [90 %-100 %] 96 % (07/03 0600) Weight:  [134 kg (295 lb 6.7 oz)] 134 kg (295 lb 6.7 oz) (07/03 0600) Weight change:   Intake/Output from previous day: 07/02 0701 - 07/03 0700 In: 120 [P.O.:120] Out: 1 [Stool:1] Intake/Output this shift:   Pleasant Alert and appropriate Lungs clear  Cor RRR abd obese Extre tr edema bilat  Lab Results:PD fliuid with 4 WBCs  Basename 09/19/11 0435  WBC 11.6*  HGB 10.3*  HCT 33.6*  PLT 314   BMET:  Basename 09/21/11 0425 09/20/11 0411  NA 134* 136  K 4.3 4.0  CL 86* 87*  CO2 24 23  GLUCOSE 144* 135*  BUN 99* 94*  CREATININE 19.68* 17.63*  CALCIUM 8.9 9.3   No results found for this basename: PTH:2 in the last 72 hours Iron Studies: No results found for this basename: IRON,TIBC,TRANSFERRIN,FERRITIN in the last 72 hours Studies/Results: Dg Chest Port 1 View  09/20/2011  *RADIOLOGY REPORT*  Clinical Data: Hypoxia. Shortness of  breath.  PORTABLE CHEST - 1 VIEW  Comparison: 09/18/2011.  Findings: Poor inspiratory portable examination.  Cardiomegaly.  Central pulmonary vascular prominence.  Limited evaluation left lung base.  No gross pneumothorax.  Mild tortuosity descending thoracic aorta.  IMPRESSION: Cardiomegaly with central pulmonary vascular prominence.  Original Report Authenticated By: Fuller Canada, M.D.    Continuous:   . dialysis solution 1.5% low-MG/low-CA    . heparin 1,500 Units (09/20/11 0204)  . DISCONTD: dialysis solution 1.5% low-MG/low-CA       LOS: 14 days   Gary Rivera C 09/21/2011,10:10 AM  7. Pulmonary hypertension by 2D echo 6/13

## 2011-09-21 NOTE — Progress Notes (Signed)
Patient refused cpap, no distress noted.

## 2011-09-21 NOTE — Progress Notes (Signed)
Received patient as a transfer from [facility].  Alert and oriented x4.  1 PIV noted on R-hand, saline-locked.  Baseline vital signs and physical assessment done and recorded.  No complaint of pain at this time.

## 2011-09-22 ENCOUNTER — Inpatient Hospital Stay (HOSPITAL_COMMUNITY): Payer: Medicare Other

## 2011-09-22 LAB — CBC
Hemoglobin: 9.6 g/dL — ABNORMAL LOW (ref 13.0–17.0)
MCH: 28.9 pg (ref 26.0–34.0)
MCHC: 32 g/dL (ref 30.0–36.0)
Platelets: 307 10*3/uL (ref 150–400)
RDW: 16.7 % — ABNORMAL HIGH (ref 11.5–15.5)

## 2011-09-22 LAB — RENAL FUNCTION PANEL
Albumin: 3.3 g/dL — ABNORMAL LOW (ref 3.5–5.2)
Calcium: 9.1 mg/dL (ref 8.4–10.5)
Creatinine, Ser: 19.69 mg/dL — ABNORMAL HIGH (ref 0.50–1.35)
GFR calc non Af Amer: 2 mL/min — ABNORMAL LOW (ref >90)
Phosphorus: 9 mg/dL — ABNORMAL HIGH (ref 2.3–4.6)
Sodium: 134 mEq/L — ABNORMAL LOW (ref 135–145)

## 2011-09-22 MED ORDER — DELFLEX-LC/2.5% DEXTROSE 394 MOSM/L IP SOLN: INTRAPERITONEAL | Status: DC

## 2011-09-22 MED ORDER — LORAZEPAM 2 MG/ML IJ SOLN
0.5000 mg | Freq: Four times a day (QID) | INTRAMUSCULAR | Status: DC | PRN
  Administered 2011-09-22 – 2011-09-24 (×2): 1 mg via INTRAVENOUS
  Filled 2011-09-22 (×2): qty 1

## 2011-09-22 MED ORDER — SODIUM CHLORIDE 0.9 % IR SOLN: Freq: Once | Status: DC

## 2011-09-22 MED ORDER — RACEPINEPHRINE HCL 2.25 % IN NEBU
0.5000 mL | INHALATION_SOLUTION | Freq: Once | RESPIRATORY_TRACT | Status: AC
  Administered 2011-09-22: 0.5 mL via RESPIRATORY_TRACT

## 2011-09-22 MED ORDER — METHYLPREDNISOLONE SODIUM SUCC 40 MG IJ SOLR
40.0000 mg | Freq: Two times a day (BID) | INTRAMUSCULAR | Status: DC
  Administered 2011-09-22 – 2011-09-24 (×5): 40 mg via INTRAVENOUS
  Filled 2011-09-22 (×9): qty 1

## 2011-09-22 MED ORDER — METHYLPREDNISOLONE SODIUM SUCC 125 MG IJ SOLR: 80.0000 mg | Freq: Once | INTRAMUSCULAR | Status: AC

## 2011-09-22 MED ORDER — CLONAZEPAM 0.5 MG PO TABS
0.5000 mg | ORAL_TABLET | Freq: Two times a day (BID) | ORAL | Status: DC | PRN
  Administered 2011-09-22 – 2011-09-23 (×2): 0.5 mg via ORAL
  Filled 2011-09-22 (×2): qty 1

## 2011-09-22 MED ORDER — RACEPINEPHRINE HCL 2.25 % IN NEBU
0.5000 mL | INHALATION_SOLUTION | Freq: Once | RESPIRATORY_TRACT | Status: AC
  Administered 2011-09-22: 0.5 mL via RESPIRATORY_TRACT
  Filled 2011-09-22: qty 0.5

## 2011-09-22 MED ORDER — DELFLEX-LM/1.5% DEXTROSE 346 MOSM/L IP SOLN
Freq: Four times a day (QID) | INTRAPERITONEAL | Status: DC
  Administered 2011-09-22: 03:00:00 via INTRAPERITONEAL

## 2011-09-22 MED ORDER — DELFLEX-LC/1.5% DEXTROSE 346 MOSM/L IP SOLN: INTRAPERITONEAL | Status: DC

## 2011-09-22 MED ORDER — METHYLPREDNISOLONE SODIUM SUCC 125 MG IJ SOLR
INTRAMUSCULAR | Status: AC
  Filled 2011-09-22: qty 2

## 2011-09-22 MED ORDER — METHYLPREDNISOLONE SODIUM SUCC 125 MG IJ SOLR
80.0000 mg | Freq: Once | INTRAMUSCULAR | Status: AC
Start: 2011-09-22 — End: 2011-09-22
  Administered 2011-09-22: 80 mg via INTRAVENOUS

## 2011-09-22 MED ORDER — HEPARIN SODIUM (PORCINE) 1000 UNIT/ML IJ SOLN
1500.0000 [IU] | Freq: Once | INTRAMUSCULAR | Status: AC
  Administered 2011-09-22: 1500 [IU] via INTRAPERITONEAL
  Filled 2011-09-22: qty 1.5

## 2011-09-22 MED ORDER — LORAZEPAM 2 MG/ML IJ SOLN: 1.0000 mg | Freq: Four times a day (QID) | INTRAMUSCULAR | Status: DC | PRN

## 2011-09-22 NOTE — Progress Notes (Addendum)
Initially after breathing treatments and solumedrol, pt breathing improved but is now worsening one hour post treatments. Pt again having difficulty talking, with audible wheezes and occasional strider. Pt using accessory muscles to breath. Dr. Butler Denmark paged concerning pt condition. RT reassessed pt. Rapid response nurse to check pt again. [name], [name]

## 2011-09-22 NOTE — Progress Notes (Signed)
Physical Therapy Treatment Patient Details Name: Gary Rivera MRN: [id number] DOB: 12-18-1960 Today's Date: 09/22/2011 Time: 0865-7846 PT Time Calculation (min): 20 min  PT Assessment / Plan / Recommendation Comments on Treatment Session  Decreased tolerance to mobility, significant drop in O2 sats on RA for even short distances (down to 76%).    Follow Up Recommendations  Home health PT;Supervision/Assistance - 24 hour;Supervision for mobility/OOB    Barriers to Discharge        Equipment Recommendations  None recommended by PT    Recommendations for Other Services    Frequency Min 3X/week   Plan Discharge plan remains appropriate;Frequency remains appropriate    Precautions / Restrictions Precautions Precautions: Fall Precaution Comments: peritoneal dialysis, O2 Restrictions Weight Bearing Restrictions: No   Pertinent Vitals/Pain O2 sats at rest on 2L O2 93%, HR 86.  O2 sats on room air for short walk down to 76%, HR 140.  Upon sitting , reapplying O2 and  practicing pursed lip breathing, O2 sats climbed to 95 quickly, HR 95.  Pt. Educated to use pursed lip breathing technique any time he feels SOB.    Mobility  Bed Mobility Bed Mobility: Supine to Sit Supine to Sit: 6: Modified independent (Device/Increase time);Other (comment);With rails (increased time needed) Details for Bed Mobility Assistance: supervision for lines Transfers Transfers: Sit to Stand;Stand to Sit Sit to Stand: 4: Min assist;From bed;With upper extremity assist Stand to Sit: 4: Min assist;With upper extremity assist;To bed Stand Pivot Transfers: Not tested (comment) Details for Transfer Assistance: cueing for hand placement and safety Ambulation/Gait Ambulation/Gait Assistance: 4: Min assist Ambulation Distance (Feet): 20 Feet Assistive device: Rolling walker Ambulation/Gait Assistance Details: cues and assist to advance and position Rw Gait Pattern: Step-through pattern;Decreased stride  length Gait velocity: decreased    Exercises     PT Diagnosis:    PT Problem List:   PT Treatment Interventions:     PT Goals Acute Rehab PT Goals PT Goal: Supine/Side to Sit - Progress: Progressing toward goal PT Goal: Sit to Stand - Progress: Progressing toward goal PT Goal: Stand to Sit - Progress: Progressing toward goal PT Goal: Ambulate - Progress: Progressing toward goal  Visit Information  Last PT Received On: 09/22/11 Assistance Needed: +1    Subjective Data  Subjective: I had a breathing treatment a little while ago.  It helped.   Cognition  Overall Cognitive Status: Appears within functional limits for tasks assessed/performed Arousal/Alertness: Awake/alert Orientation Level: Appears intact for tasks assessed Behavior During Session: [facility] for tasks performed    Balance  Static Sitting Balance Static Sitting - Balance Support: Bilateral upper extremity supported;Feet supported  End of Session PT - End of Session Equipment Utilized During Treatment: Gait belt Activity Tolerance: Patient limited by fatigue Patient left: in bed;with call bell/phone within reach Nurse Communication: Mobility status   GP     Gary Rivera 09/22/2011, 11:16 AM Acute Rehabilitation Services [phone] (pager)

## 2011-09-22 NOTE — Progress Notes (Addendum)
Subjective: Interval History: Having difficulty breathing, says he is "congested".   Objective: Vital signs in last 24 hours: Temp:  [98 F (36.7 C)-99 F (37.2 C)] 98.8 F (37.1 C) (07/04 1035) Pulse Rate:  [82-89] 86  (07/04 1035) Resp:  [16-20] 20  (07/04 1035) BP: (106-123)/(56-77) 118/70 mmHg (07/04 1035) SpO2:  [90 %-99 %] 99 % (07/04 1035) Weight:  [134 kg (295 lb 6.7 oz)] 134 kg (295 lb 6.7 oz) (07/03 2042) Weight change: 0 kg (0 lb)  Intake/Output from previous day: 07/03 0701 - 07/04 0700 In: 480 [P.O.:480] Out: -  Intake/Output this shift: Total I/O In: 240 [P.O.:240] Out: -  Gen: dyspneic, ?stridor, upper airway noises Lungs clear post bilat, no rales, + upper airway sounds bilat ant  Cor RRR abd obese Ext:  No edema bilat  Lab Results:PD fliuid with 4 WBCs  Basename 09/22/11 0550  WBC 11.1*  HGB 9.6*  HCT 30.0*  PLT 307   BMET:   Basename 09/22/11 0550 09/21/11 0425  NA 134* 134*  K 4.3 4.3  CL 88* 86*  CO2 23 24  GLUCOSE 105* 144*  BUN 99* 99*  CREATININE 19.69* 19.68*  CALCIUM 9.1 8.9   No results found for this basename: PTH:2 in the last 72 hours Iron Studies: No results found for this basename: IRON,TIBC,TRANSFERRIN,FERRITIN in the last 72 hours Studies/Results: Dg Chest Port 1 View  09/20/2011  *RADIOLOGY REPORT*  Clinical Data: Hypoxia. Shortness of breath.  PORTABLE CHEST - 1 VIEW  Comparison: 09/18/2011.  Findings: Poor inspiratory portable examination.  Cardiomegaly.  Central pulmonary vascular prominence.  Limited evaluation left lung base.  No gross pneumothorax.  Mild tortuosity descending thoracic aorta.  IMPRESSION: Cardiomegaly with central pulmonary vascular prominence.  Original Report Authenticated By: Fuller Canada, M.D.    Assessment 1. ESRD - started CAPD in Jan doing 4 exchanges a day.  Was told he might have to switch over to the cycler. Dr. Butler Denmark spoke with [facility] nephrologist on call for Dr. Louis Meckel who said he is a  "high-average" transporter.  Labs show solute clearance failure in -house with creat 11 > 19 since admitted.   2. Pulm edema/volume excess- improved by CXR 7/2, down 17kg over 2 wks since admission. STAT CXR just now does not show any new edema.  3. Resp- "congested" and has what sounds like upper airway noises today ? Stridor, vocal cord trauma from intubation.  4. Anemia- on aranesp.  5. Secondary hyperparathyroidism- continue calcitriol and renvela plus renavite .  6. HTN/volume- BP low now.  He was on clonidine 0.2bid, labetalol 300bid and procardia 60/d at home. Low BP suggest below EDW, especially with pulmonary hypertension. On Midodrine now. May be able to d/c midodrine if we let volume come up a bit.   7. Respiratory failure- Prior hx OSA/BIPAP  Plan Will try switching to cycler to see if shorter exchanges will improve his solute clearance, since he is a high transporter.  If that fails, then he is agreeable to switching to hemodialysis, for which we would start with a tunneled HD catheter placement.  Discussed with Dr. Butler Denmark, do not think vol overload is cause for current resp complaints.

## 2011-09-22 NOTE — Progress Notes (Signed)
Pt found with audible stridor, severe SOB, lung fields severely diminished. Pt hardly able to talk and using accessory muscles to breath. Rapid response paged. Respiratory therapy at bedside. Dr. Butler Denmark notified and new orders received. Gary Rivera, Gary Rivera

## 2011-09-22 NOTE — Consult Note (Signed)
Reason for Consult: Stridor, shortness of breath Referring Physician: Dr. Butler Denmark  HPI:  Gary Rivera is a 51 y.o. male with a history of HTN and ESRD on PD. The patient was recently intubated on 6/20 for congestive heart failure and fluid overload. He was extubated on 6/26. Since extubation, the patient has been experiencing stridor, with occasional shortness of breath. The patient is morbidly obese, and has a history of obstructive sleep apnea. According to the wife, he had a history of occasional shortness of breath. However his current stridor is new and significantly more severe than in the past. He gets peritoneal dialysis 4 times a day. He has noticed a gain of about 20 pounds recently. It was later discovered that the patient was not compliant with peritoneal dialysis at home. He has no previous history of head and neck surgery.  Past Medical History  Diagnosis Date  . Hypertension   . Renal disorder   . CHF (congestive heart failure)   . Peritoneal dialysis catheter in place   . Anemia     patient reporats that last hgb 7.9 a week ago    Past Surgical History  Procedure Date  . Appendectomy     Family History  Problem Relation Age of Onset  . Diabetes Mother   . Hypertension Mother   . Hypertension Sister     Social History:  reports that he has never smoked. He does not have any smokeless tobacco history on file. He reports that he does not drink alcohol or use illicit drugs.  Allergies:  Allergies  Allergen Reactions  . Prednisone Nausea Only    Medications:  I have reviewed the patient's current medications. Prior to Admission:  Prescriptions prior to admission  Medication Sig Dispense Refill  . aspirin 325 MG tablet Take 325 mg by mouth daily.        Marland Kitchen b complex-vitamin c-folic acid (NEPHRO-VITE) 0.8 MG TABS Take 0.8 mg by mouth daily.      . calcitRIOL (ROCALTROL) 0.25 MCG capsule Take 0.25 mcg by mouth See admin instructions. Takes 1 capsule daily Monday  through friday      . calcium carbonate (TUMS - DOSED IN MG ELEMENTAL CALCIUM) 500 MG chewable tablet Chew 1 tablet by mouth 3 (three) times daily with meals.      . clonazePAM (KLONOPIN) 0.5 MG tablet Take 0.5 mg by mouth 2 (two) times daily as needed. anxiety      . cloNIDine (CATAPRES) 0.2 MG tablet Take 0.2 mg by mouth 2 (two) times daily as needed. Only if blood pressure is elevated      . HYDROcodone-acetaminophen (VICODIN) 5-500 MG per tablet Take 1 tablet by mouth every 6 (six) hours as needed. pain      . labetalol (NORMODYNE) 300 MG tablet Take 300 mg by mouth 2 (two) times daily.        Marland Kitchen NIFEdipine (PROCARDIA-XL/ADALAT CC) 60 MG 24 hr tablet Take 60 mg by mouth daily.        . sevelamer (RENVELA) 800 MG tablet Take 800 mg by mouth See admin instructions. Takes 1 tablet tid with meals and snacks       Scheduled:   . antiseptic oral rinse  15 mL Mouth Rinse BID  . aspirin  325 mg Oral Daily  . calcitRIOL  0.25 mcg Oral Custom  . darbepoetin (ARANESP) injection - DIALYSIS  150 mcg Subcutaneous Q Thu-HD  . dialysis solution for CAPD/CCPD [facility])   Peritoneal Dialysis QID  .  dialysis solution for CAPD/CCPD [facility])   Peritoneal Dialysis Daily  . famotidine  20 mg Oral Daily  . feeding supplement  1 Container Oral Q24H  . heparin 500 unit irrigation   Irrigation Once  . heparin  1,500 Units Intraperitoneal Once in dialysis  . methylPREDNISolone (SOLU-MEDROL) injection  80 mg Intravenous Once  . methylPREDNISolone (SOLU-MEDROL) injection  80 mg Intravenous Once  . methylPREDNISolone sodium succinate      . methylPREDNISolone (SOLU-MEDROL) injection  40 mg Intravenous Q12H  . midodrine  5 mg Oral TID WC  . multivitamin  1 tablet Oral Daily  . Racepinephrine HCl  0.5 mL Nebulization Once  . Racepinephrine HCl  0.5 mL Nebulization Once  . sevelamer  2,400 mg Oral TID WC  . sodium chloride  500 mL Intravenous Once  . sodium chloride  3 mL Intravenous Q12H  . sorbitol  30 mL Oral  Once   UUV:OZDGUY chloride, acetaminophen, albuterol, bisacodyl, clonazePAM, HYDROcodone-acetaminophen, LORazepam, ondansetron (ZOFRAN) IV, phenol, sevelamer, sodium chloride, sodium chloride, DISCONTD: LORazepam  Results for orders placed during the hospital encounter of 09/07/11 (from the past 48 hour(s))  BODY FLUID CELL COUNT WITH DIFFERENTIAL     Status: Abnormal   Collection Time   09/21/11  3:22 AM      Component Value Range Comment   Fluid Type-FCT PERITONEAL CAVITY      Color, Fluid COLORLESS (*) YELLOW    Appearance, Fluid CLEAR  CLEAR    WBC, Fluid 4  0 - 1000 cu mm    Neutrophil Count, Fluid RARE  0 - 25 % TOO FEW TO COUNT, SMEAR AVAILABLE FOR REVIEW   Lymphs, Fluid FEW      Monocyte-Macrophage-Serous Fluid FEW  50 - 90 %   BODY FLUID CULTURE     Status: Normal (Preliminary result)   Collection Time   09/21/11  3:22 AM      Component Value Range Comment   Specimen Description PERITONEAL CAVITY      Special Requests Immunocompromised      Gram Stain        Value: NO WBC SEEN     NO ORGANISMS SEEN   Culture NO GROWTH 1 DAY      Report Status PENDING     PATHOLOGIST SMEAR REVIEW     Status: Normal   Collection Time   09/21/11  3:22 AM      Component Value Range Comment   Tech Review CELLULAR FEATURES OF A TRANSUDATE.     RENAL FUNCTION PANEL     Status: Abnormal   Collection Time   09/21/11  4:25 AM      Component Value Range Comment   Sodium 134 (*) 135 - 145 mEq/L    Potassium 4.3  3.5 - 5.1 mEq/L    Chloride 86 (*) 96 - 112 mEq/L    CO2 24  19 - 32 mEq/L    Glucose, Bld 144 (*) 70 - 99 mg/dL    BUN 99 (*) 6 - 23 mg/dL    Creatinine, Ser 40.34 (*) 0.50 - 1.35 mg/dL    Calcium 8.9  8.4 - 74.2 mg/dL    Phosphorus 9.5 (*) 2.3 - 4.6 mg/dL    Albumin 3.1 (*) 3.5 - 5.2 g/dL    GFR calc non Af Amer 2 (*) >90 mL/min    GFR calc Af Amer 3 (*) >90 mL/min   RENAL FUNCTION PANEL     Status: Abnormal   Collection Time  09/22/11  5:50 AM      Component Value Range Comment    Sodium 134 (*) 135 - 145 mEq/L    Potassium 4.3  3.5 - 5.1 mEq/L    Chloride 88 (*) 96 - 112 mEq/L    CO2 23  19 - 32 mEq/L    Glucose, Bld 105 (*) 70 - 99 mg/dL    BUN 99 (*) 6 - 23 mg/dL    Creatinine, Ser 16.10 (*) 0.50 - 1.35 mg/dL    Calcium 9.1  8.4 - 96.0 mg/dL    Phosphorus 9.0 (*) 2.3 - 4.6 mg/dL    Albumin 3.3 (*) 3.5 - 5.2 g/dL    GFR calc non Af Amer 2 (*) >90 mL/min    GFR calc Af Amer 3 (*) >90 mL/min   CBC     Status: Abnormal   Collection Time   09/22/11  5:50 AM      Component Value Range Comment   WBC 11.1 (*) 4.0 - 10.5 K/uL    RBC 3.32 (*) 4.22 - 5.81 MIL/uL    Hemoglobin 9.6 (*) 13.0 - 17.0 g/dL    HCT 45.4 (*) 09.8 - 52.0 %    MCV 90.4  78.0 - 100.0 fL    MCH 28.9  26.0 - 34.0 pg    MCHC 32.0  30.0 - 36.0 g/dL    RDW 11.9 (*) 14.7 - 15.5 %    Platelets 307  150 - 400 K/uL     Dg Chest Port 1 View  09/22/2011  *RADIOLOGY REPORT*  Clinical Data: Respiratory difficulty.  Question edema.  PORTABLE CHEST - 1 VIEW  Comparison: 09/20/2011.  Findings: Poor inspiratory slightly motion degraded portable examination.  Elevated right hemidiaphragm.  Cardiomegaly.  Central pulmonary vascular prominence.  No gross pneumothorax.  No mid to upper lung consolidation detected.  Limited evaluation lung bases.  The patient would eventually benefit from two-view chest with better inspiration.  Tortuous aorta.  IMPRESSION: Portable exam as detailed above demonstrates cardiomegaly and central pulmonary vascular prominence.  Limited evaluation lung bases.  Original Report Authenticated By: Fuller Canada, M.D.   Review of Systems  Constitutional:Negative for fever, weight loss and diaphoresis.  HENT: As above. Negative for neck pain.  Eyes: Negative.  Respiratory: Positive for cough, shortness of breath and wheezing. Negative for hemoptysis and sputum production.  Cardiovascular: Positive for  orthopnea and leg swelling. Negative for palpitations, claudication and PND.    Gastrointestinal: Negative for heartburn, vomiting, abdominal pain, diarrhea, constipation, blood in stool and melena.  Genitourinary: Negative.  Musculoskeletal: Positive for back pain. Negative for myalgias, joint pain and falls.  Skin: Negative.  Neurological: Positive for weakness.  Endo/Heme/Allergies: Negative.  Psychiatric/Behavioral: Negative.   Blood pressure 131/92, pulse 92, temperature 98.9 F (37.2 C), temperature source Oral, resp. rate 33, height 6\' 2"  (1.88 m), weight 134 kg (295 lb 6.7 oz), SpO2 95.00%.  Physical Exam  Constitutional: He is oriented to person, place, and time. He appears in mild distress secondary to stridor. Obese middle aged man in fair condition- appears to have labored respirations  HENT: His pupils are equal, round, reactive to light. Extraocular motion is intact. Examination of the ears shows normal auricles and external auditory canals bilaterally. Nasal examination shows normal mucosa, septum, turbinates. Facial examination shows no asymmetry. Palpation of the face elicit no significant tenderness. Oral cavity examination shows no mucosal lacerations. No significant trismus is noted. Palpation of the neck reveals no lymphadenopathy or mass.  The trachea is midline. The thyroid is not significantly enlarged. Cranial nerves 2-12 are all grossly in tact. Head: Normocephalic and atraumatic.  Cardiovascular: Normal rate, regular rhythm and normal heart sounds.  Labored respirations but able to speak complete sentences  Musculoskeletal: Normal range of motion. He exhibits edema.  2+ LE edema  Lymphadenopathy:  He has no cervical adenopathy.  Neurological: He is alert and oriented to person, place, and time. No cranial nerve deficit. Coordination normal.  Skin: Skin is warm and dry. No rash noted. He is not diaphoretic. No erythema.  Psychiatric: He has a normal mood and affect. His behavior is normal.   Procedure:  Flexible Fiberoptic  Laryngoscopy Anesthesia: Topical oxymetazoline and lidocaine Indication: Persistent stridor, respiratory distress Description: Risks, benefits, and alternatives of flexible endoscopy were explained to the patient. Specific mention was made of the risk of throat numbness with difficulty swallowing, possible bleeding from the nose and mouth, and pain from the procedure.  The patient gave oral consent to proceed.  The nasal cavities were decongested and anesthetised with a combination of oxymetazoline and 4% lidocaine solution.  The flexible scope was inserted into the left nasal cavity and advanced towards the nasopharynx.  Visualized mucosa over the turbinates and septum were normal.  The nasopharynx was clear.  Oropharyngeal walls were symmetric and mobile without lesion, mass, or edema.  Hypopharynx was also without  lesion or edema.   No lesions or asymmetry in the supraglottic larynx.  Arytenoid mucosa was edematous.  Posterior commissure with edema and redundant mucosa.  True vocal folds were edematous, erythematous, and stationary in paramedian position.  Both vocal cords are not mobile.  Assessment/Plan: Bilateral vocal cords edema and erythema, with minimal movement even with maximal inspiratory effort.  Agree with IV steroid. Low threshold for re-intubation.  If his condition persists, may need trach placement.  Will follow.  Alys Dulak,SUI W 09/22/2011, 6:57 PM

## 2011-09-22 NOTE — Progress Notes (Signed)
Report called to [facility], RN on 3300. Pt to be transferred to [id number]. [name], Rosanna Randy

## 2011-09-22 NOTE — Progress Notes (Signed)
Late entry. I was called yesterday by Nursing Secretary Francena Hanly on 6700 that Mr. Camps informed his nurse that he no longer wanted to see me as his nephrologist.  I suspect this action was precipitated by my advice that the peritoneal dialysis was failing, and he needed to consider hemodialysis preparations prior to getting sicker and getting readmitted.  I certainly will respect his wish, but this request will reflect the action of not being seen by [facility].  I have spoken to Ms Caple, Nurse Manager and informed her of such.  There are other options for nephrology care and they should be pursued. We have officially signed off this case and I understand the primary attending is aware. Krishon Adkison C

## 2011-09-22 NOTE — Progress Notes (Signed)
RT Note: Pt refused to wear CPAP at this time. He stated he would call if he needed to be placed on CPAP. RT will continue to monitor.

## 2011-09-22 NOTE — Progress Notes (Signed)
[facility]  [place] TEAM 1 - Stepdown/ICU TEAM  PCP: Geraldo Pitter, MD  Interval History: Patient has history of HTN and ESRD on PD. In the past patient had received his care at [facility] and had been hospitalized there about a year ago with fluid overload and possible CHF. Recently he been going to a renal doctor in [facility] [place]). He gets peritoneal dialysis 4 times a day. He has noticed a gain of about 20 pounds recently and started to have worsening shortness of breath for past 24 hours. He presented to [facility] emergency department and was found to be fluid overloaded. Hospitalist was called to admit. Dr. Allena Katz with renal recommended transferred to [facility]. On 6/20 he developed worsening resp distress with bilat infiltrates most c/w edema. He failed NPPV and required intubation. He was extubated on 6/26. It was later discovered that the patient was not compliant with peritoneal dialysis at home.  Subjective: Stridor continues. Per Resp therapist he was in distress when she first entered the room and he needed a Neb treatment before some improvement was noted. She did not obtain a pulse ox at that time but pulse ox currently was about 92% on 2 L O2. Pt thinks he noted some improvement in the stridor with the Solumedrol that was given a couple of days ago and would like to try it again.   Objective: Blood pressure 131/92, pulse 92, temperature 98.9 F (37.2 C), temperature source Oral, resp. rate 33, height 6\' 2"  (1.88 m), weight 134 kg (295 lb 6.7 oz), SpO2 95.00%.  Intake/Output from previous day: 07/03 0701 - 07/04 0700 In: 480 [P.O.:480] Out: -  Intake/Output this shift: Total I/O In: 240 [P.O.:240] Out: -  General appearance: alert,  no distress  Resp: clear to auscultation bilaterally - stridor noted Cardio: regular rate and rhythm, S1, S2 normal, no murmur, click, rub or gallop GI: soft and nondistended, non-tender; bowel sounds normal; no masses,  no  organomegaly Extremities: extremities normal, atraumatic, no cyanosis or edema Neurologic: Grossly normal  Lab Results:  Basename 09/22/11 0550  WBC 11.1*  HGB 9.6*  HCT 30.0*  PLT 307   BMET  Basename 09/22/11 0550 09/21/11 0425  NA 134* 134*  K 4.3 4.3  CL 88* 86*  CO2 23 24  GLUCOSE 105* 144*  BUN 99* 99*  CREATININE 19.69* 19.68*  CALCIUM 9.1 8.9    Studies/Results: Dg Chest Port 1 View  09/22/2011  *RADIOLOGY REPORT*  Clinical Data: Respiratory difficulty.  Question edema.  PORTABLE CHEST - 1 VIEW  Comparison: 09/20/2011.  Findings: Poor inspiratory slightly motion degraded portable examination.  Elevated right hemidiaphragm.  Cardiomegaly.  Central pulmonary vascular prominence.  No gross pneumothorax.  No mid to upper lung consolidation detected.  Limited evaluation lung bases.  The patient would eventually benefit from two-view chest with better inspiration.  Tortuous aorta.  IMPRESSION: Portable exam as detailed above demonstrates cardiomegaly and central pulmonary vascular prominence.  Limited evaluation lung bases.  Original Report Authenticated By: Fuller Canada, M.D.   Medications: I have reviewed the patient's current medications.  Assessment/Plan:  Acute respiratory failure with hypoxia and hypercarbia *Multifactorial etiology related to acute systolic heart failure brought about by noncompliance with dialysis as well as a degree of right-sided heart failure from untreated obstructive sleep apnea *Continue nasal cannula oxygen and treat underlying causes * PCXR repeated today 09/22/2011 demonstrated stable cardiomegaly with central pulmonary vascular prominence  Hypotension *Resolved-was started on midodrine on 09/20/2011 *Orthostatic  vital signs did demonstrate a 20 mmHg drop from lying to sitting *Over a period of 48 hours did require 2 separate 500 cc normal saline challenges *Cortisol level within normal limits *Likely related to volume depletion post  aggressive volume removal for acute heart failure during this admission *It is noted that this patient's weight has decreased from 151.3 kg at presentation to 133 kg  Acute delirium *Resolved after correction of hypotension *Unclear if related to solitary dose of Solu-Medrol given earlier 09/20/2011 or was a complication of his persistent hypotension *Also has h/o hypercarbic respiratory failure this admit and has not been wearing his CPAP -an ABG was obtained on 09/20/2011 which demonstrated stable hypercarbia with a PCO2 of 50 which is at this patient's baseline. 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  He is to be reintubated if he develops further respiratory distress.   Intermittent fever - ? 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 Spoke with Nephrology at [facility] point [facility], his specific nephrologist will not be back until next Tuesday.   History of HTN (hypertension) *Blood pressure actually soft with systolic blood pressures in the 90s with appropriate  reduction in fluid volume with peritoneal dialysis  Right heart failure/Pulmonary HTN/ Mitral regurgitation (MODERATE) *This is also associated with moderate to severe tricuspid regurgitation as well as moderate to severely dilated left/right atrium and mildly dilated right ventricle which is c/w underlying right heart failure-measure 50 mmHg in 2011 *Pulmonary hypertension has progressed in one year-now measuring 60 mmHg based on echocardiogram this admission *pt has been warned (in very clear terms - "not using CPAP will shorten your llfe") that this will worsen without CPAP use, but he makes it clear in a very animated and negative fashion that he has no interest in using CPAP either here or at home   LVH (left ventricular hypertrophy) MODERATE *No evidence of diastolic dysfunction on recent echocardiogram  Anemia *Primarily mediated by chronic kidney disease *Continue Aranesp *Would like to keep hemoglobin greater than 7 if possible - transfuse as needed  Obesity with BMI of 37.6, adult/OSA/OHS  *Pulmonary suspects the patient has underlying obesity hypoventilation syndrome as well as sleep apnea *Pulmonary recommends formal sleep study as an outpatient but pt states very clearly that he is not interested in a sleep study or CPAP   Disposition *transfer back to SDU   LOS: 15 days  09/22/2011, 6:13 PM  Calvert Cantor, MD [phone]

## 2011-09-22 NOTE — Progress Notes (Signed)
Pt admitted from 6700, stridor present, lung fields diminished throughout.  Pt AOx3, rapid response RN transported pt.

## 2011-09-23 DIAGNOSIS — R0602 Shortness of breath: Secondary | ICD-10-CM

## 2011-09-23 LAB — RENAL FUNCTION PANEL
Albumin: 3.4 g/dL — ABNORMAL LOW (ref 3.5–5.2)
Chloride: 87 mEq/L — ABNORMAL LOW (ref 96–112)
Creatinine, Ser: 19.21 mg/dL — ABNORMAL HIGH (ref 0.50–1.35)
GFR calc non Af Amer: 2 mL/min — ABNORMAL LOW (ref >90)
Potassium: 4.8 mEq/L (ref 3.5–5.1)
Sodium: 136 mEq/L (ref 135–145)

## 2011-09-23 MED ORDER — DELFLEX-LC/1.5% DEXTROSE 346 MOSM/L IP SOLN
INTRAPERITONEAL | Status: DC
  Administered 2011-09-23: 10000 mL via INTRAPERITONEAL

## 2011-09-23 MED ORDER — DELFLEX-LC/1.5% DEXTROSE 346 MOSM/L IP SOLN: Freq: Once | INTRAPERITONEAL | Status: DC

## 2011-09-23 MED ORDER — DELFLEX-LC/2.5% DEXTROSE 394 MOSM/L IP SOLN
INTRAPERITONEAL | Status: DC
  Administered 2011-09-23: 10000 mL via INTRAPERITONEAL

## 2011-09-23 NOTE — Progress Notes (Signed)
Subjective: Patient reports improvement in his breathing. Not working as hard to breath. No chest pain.  Objective: Vital signs in last 24 hours: Temp:  [97.8 F (36.6 C)-99 F (37.2 C)] 97.8 F (36.6 C) (07/05 0800) Pulse Rate:  [77-113] 95  (07/05 0800) Resp:  [16-33] 23  (07/05 0800) BP: (108-163)/(54-96) 108/83 mmHg (07/05 0800) SpO2:  [88 %-100 %] 95 % (07/05 0800)  Physical Exam  Constitutional: He is oriented to person, place, and time. He appears in no distress.  HENT: His pupils are equal, round, reactive to light. Extraocular motion is intact. Examination of the ears shows normal auricles and external auditory canals bilaterally. Nasal examination shows normal mucosa, septum, turbinates. Facial examination shows no asymmetry. Palpation of the face elicit no significant tenderness. Oral cavity examination shows no mucosal lacerations. No significant trismus is noted. Palpation of the neck reveals no lymphadenopathy or mass. The trachea is midline. The thyroid is not significantly enlarged. Cranial nerves 2-12 are all grossly in tact.  Pt still has stridorous breathing, but not as labored as yesterday. Head: Normocephalic and atraumatic.  Cardiovascular: Normal rate, regular rhythm and normal heart sounds.   Lymphadenopathy: He has no cervical adenopathy.  Neurological: He is alert and oriented to person, place, and time. No cranial nerve deficit. Coordination normal.     Basename 09/22/11 0550  WBC 11.1*  HGB 9.6*  HCT 30.0*  PLT 307    Basename 09/23/11 0357 09/22/11 0550  NA 136 134*  K 4.8 4.3  CL 87* 88*  CO2 26 23  GLUCOSE 152* 105*  BUN 99* 99*  CREATININE 19.21* 19.69*  CALCIUM 9.4 9.1    Medications:  I have reviewed the patient's current medications. Scheduled:   . antiseptic oral rinse  15 mL Mouth Rinse BID  . aspirin  325 mg Oral Daily  . calcitRIOL  0.25 mcg Oral Custom  . darbepoetin (ARANESP) injection - DIALYSIS  150 mcg Subcutaneous Q Thu-HD   . dialysis solution for CAPD/CCPD [facility])   Peritoneal Dialysis QID  . dialysis solution for CAPD/CCPD [facility])   Peritoneal Dialysis Daily  . famotidine  20 mg Oral Daily  . feeding supplement  1 Container Oral Q24H  . heparin 500 unit irrigation   Irrigation Once  . methylPREDNISolone (SOLU-MEDROL) injection  80 mg Intravenous Once  . methylPREDNISolone (SOLU-MEDROL) injection  80 mg Intravenous Once  . methylPREDNISolone sodium succinate      . methylPREDNISolone (SOLU-MEDROL) injection  40 mg Intravenous Q12H  . midodrine  5 mg Oral TID WC  . multivitamin  1 tablet Oral Daily  . Racepinephrine HCl  0.5 mL Nebulization Once  . Racepinephrine HCl  0.5 mL Nebulization Once  . sevelamer  2,400 mg Oral TID WC  . sodium chloride  500 mL Intravenous Once  . sodium chloride  3 mL Intravenous Q12H  . sorbitol  30 mL Oral Once   WGN:FAOZHY chloride, acetaminophen, albuterol, bisacodyl, clonazePAM, HYDROcodone-acetaminophen, LORazepam, ondansetron (ZOFRAN) IV, phenol, sevelamer, sodium chloride, sodium chloride, DISCONTD: LORazepam  Assessment/Plan: Respiratory stridor secondary to bilateral vocal cord edema and paresis.  Mildly improved with steroid treatment. Low threshold for re-intubation. Will follow.   LOS: 16 days   Laurelle Skiver,SUI W 09/23/2011, 11:35 AM

## 2011-09-23 NOTE — Progress Notes (Signed)
Subjective: Interval History: ENT eval showed VC edema and minimal movement, recommended IV steroids. Says his breathing is "much better" today.    Objective: Vital signs in last 24 hours: Temp:  [97.8 F (36.6 C)-99 F (37.2 C)] 97.8 F (36.6 C) (07/05 0800) Pulse Rate:  [77-113] 95  (07/05 0800) Resp:  [16-33] 23  (07/05 0800) BP: (108-163)/(54-96) 108/83 mmHg (07/05 0800) SpO2:  [88 %-100 %] 95 % (07/05 0800) Weight change:   Intake/Output from previous day: 07/04 0701 - 07/05 0700 In: 600 [P.O.:600] Out: -  Intake/Output this shift:   Gen: +stridor, slightly better Lungs clear post bilat, no rales, + upper airway sounds bilat ant  Cor RRR abd obese Ext:  No edema bilat  Lab Results:PD fliuid with 4 WBCs  Basename 09/22/11 0550  WBC 11.1*  HGB 9.6*  HCT 30.0*  PLT 307   BMET:   Basename 09/23/11 0357 09/22/11 0550  NA 136 134*  K 4.8 4.3  CL 87* 88*  CO2 26 23  GLUCOSE 152* 105*  BUN 99* 99*  CREATININE 19.21* 19.69*  CALCIUM 9.4 9.1   No results found for this basename: PTH:2 in the last 72 hours Iron Studies: No results found for this basename: IRON,TIBC,TRANSFERRIN,FERRITIN in the last 72 hours Studies/Results: Dg Chest Port 1 View  09/22/2011  *RADIOLOGY REPORT*  Clinical Data: Respiratory difficulty.  Question edema.  PORTABLE CHEST - 1 VIEW  Comparison: 09/20/2011.  Findings: Poor inspiratory slightly motion degraded portable examination.  Elevated right hemidiaphragm.  Cardiomegaly.  Central pulmonary vascular prominence.  No gross pneumothorax.  No mid to upper lung consolidation detected.  Limited evaluation lung bases.  The patient would eventually benefit from two-view chest with better inspiration.  Tortuous aorta.  IMPRESSION: Portable exam as detailed above demonstrates cardiomegaly and central pulmonary vascular prominence.  Limited evaluation lung bases.  Original Report Authenticated By: Fuller Canada, M.D.    Assessment/Plan 1. ESRD -  started CAPD in Jan doing 4 exchanges a day with [facility], Dr. Louis Meckel.  Was told he might have to switch over to the cycler at some point by his kidney doctors. Was admitted elsewhere for volume overload once, then admitted here for volume overload 2 wks ago. Gradually with PD weight has been reduced 17kg and pulm edema slowly resolved.  However solute clearance has been poor with rising creatinine 11 to 19 in hospital on CAPD (long exchanges). Dr. Butler Denmark spoke with [place] nephrologist on call for Dr. Louis Meckel who said he is a "high-average" transporter. We have put him on the cycler to see if more frequent shorter exchanges will help in solute clearance.  Will try 6 exchanges (90 min each) per day overnight for now.  2. Pulm edema/volume excess- resolved, down 17kg from admission. 3. Stridor/VC edema- per primary, on IV steroids 4. Anemia- on aranesp 150/wk, Hb 9's  5. Secondary hyperparathyroidism- continue calcitriol and renvela plus renavite .  6. HTN/volume- BP low now.  He was on clonidine 0.2bid, labetalol 300bid and procardia 60/d at home. Now on midodrine for low BP's, prob due to vol depletion.  Will stop midodrine and follow BP.   7. Respiratory failure- Prior hx OSA/BIPAP  [name]  MD [facility] [phone] pgr    [phone] cell 09/23/2011, 12:08 PM

## 2011-09-23 NOTE — Progress Notes (Signed)
Physical Therapy Treatment Patient Details Name: Gary Rivera MRN: [id number] DOB: 12-22-1960 Today's Date: 09/23/2011 Time: 1478-2956 PT Time Calculation (min): 20 min  PT Assessment / Plan / Recommendation Comments on Treatment Session  Much improved, stridor improving.  Balance still needs a bit of work.    Follow Up Recommendations  Home health PT;Supervision/Assistance - 24 hour;Supervision for mobility/OOB    Barriers to Discharge        Equipment Recommendations  Defer to next venue    Recommendations for Other Services    Frequency Min 3X/week   Plan Discharge plan remains appropriate;Frequency remains appropriate    Precautions / Restrictions Precautions Precautions: Fall Precaution Comments: peritoneal dialysis, O2 Restrictions Weight Bearing Restrictions: No   Pertinent Vitals/Pain     Mobility  Bed Mobility Bed Mobility: Supine to Sit Supine to Sit: 6: Modified independent (Device/Increase time);Other (comment);With rails Details for Bed Mobility Assistance: supervision for lines Transfers Transfers: Sit to Stand;Stand to Sit Sit to Stand: 5: Supervision Stand to Sit: 5: Supervision Details for Transfer Assistance: cueing for hand placement and safety Ambulation/Gait Ambulation/Gait Assistance: 4: Min guard Ambulation Distance (Feet): 380 Feet Assistive device:  (W/C to push) Ambulation/Gait Assistance Details: postural checks and cues to get closer to the W/C otherwise no assist Gait Pattern: Within Functional Limits;Decreased stride length Gait velocity: decreased Stairs: No Wheelchair Mobility Wheelchair Mobility: No    Exercises     PT Diagnosis:    PT Problem List:   PT Treatment Interventions:     PT Goals Acute Rehab PT Goals Time For Goal Achievement: 09/29/11 Potential to Achieve Goals: Good PT Goal: Supine/Side to Sit - Progress: Progressing toward goal PT Goal: Sit to Stand - Progress: Progressing toward goal PT Goal: Stand to  Sit - Progress: Progressing toward goal PT Goal: Ambulate - Progress: Progressing toward goal  Visit Information  Last PT Received On: 09/23/11 Assistance Needed: +1    Subjective Data  Subjective: I really want to go outside.   Cognition  Overall Cognitive Status: Appears within functional limits for tasks assessed/performed Arousal/Alertness: Awake/alert Orientation Level: Appears intact for tasks assessed Behavior During Session: [facility] for tasks performed    Balance  Static Sitting Balance Static Sitting - Balance Support: Feet supported;No upper extremity supported Static Sitting - Level of Assistance: 7: Independent  End of Session PT - End of Session Equipment Utilized During Treatment: Gait belt Activity Tolerance: Patient tolerated treatment well Patient left: in bed;with call bell/phone within reach (at [facility]) Nurse Communication: Mobility status   GP     Myrta Mercer, Eliseo Gum 09/23/2011, 4:41 PM  09/23/2011  [place], PT [phone] (pager)

## 2011-09-23 NOTE — Progress Notes (Signed)
09/23/2011 1:20 PM  After multiple attempts at draining patient via cycler, pt finally disconnected per MD order.  Unsure if some fluid is left in patient, however, he states he feels dry.  I am not sure if the cycler weight got thrown off somehow, however, there was a lot of issues getting the cycler to drain correctly.  For that reason, the last net UF i have before the sixth fill was 910cc off of the patient.  The patient then went into the sixth fill and was filled with 2500cc.  Again, I am unsure how much we got off after the sixth dwell as the weight on the scale was thrown off, it did appear to be a fair amount of fluid in the collection bag.  Pt tolerated well, will resume PD per MD order around 7pm this evening.    Eunice Blase

## 2011-09-23 NOTE — Progress Notes (Signed)
Clinical Social Work  Per patient's sister, patient has been set up with cycler from PCP. Patient is to complete training on 10/03/11 for cycler. Patient wanted medical team aware of status.  [place], [place] [phone]

## 2011-09-23 NOTE — Progress Notes (Signed)
[facility]  [place] TEAM 1 - Stepdown/ICU TEAM  PCP: Geraldo Pitter, MD  Interval History: Patient has history of HTN and ESRD on PD. In the past patient had received his care at [facility] and had been hospitalized there about a year ago with fluid overload and possible CHF. Recently he been going to a renal doctor in [facility]). He gets peritoneal dialysis 4 times a day. He has noticed a gain of about 20 pounds recently and started to have worsening shortness of breath for past 24 hours. He presented to [facility] emergency department and was found to be fluid overloaded. Hospitalist was called to admit. Dr. Allena Katz with renal recommended transferred to [facility]. On 6/20 he developed worsening resp distress with bilat infiltrates most c/w edema. He failed NPPV and required intubation. He was extubated on 6/26. It was later discovered that the patient was not compliant with peritoneal dialysis at home.  Subjective: Alert and stridor and improving. Patient has questions regarding whether he can obtain a cycler similar to the one utilize in the hospital for use at home. He complains the room is too cold requests return to temperature. He denies chest pain or dizziness.  Objective: Blood pressure 108/83, pulse 95, temperature 97.8 F (36.6 C), temperature source Oral, resp. rate 23, height 6\' 2"  (1.88 m), weight 134 kg (295 lb 6.7 oz), SpO2 95.00%.  Intake/Output from previous day: 07/04 0701 - 07/05 0700 In: 600 [P.O.:600] Out: -  Intake/Output this shift:   General appearance: alert,  no distress  HEENT: Raspy sounding vocal quality which is similar to earlier in the week, face mouth and tongue not examined Resp: clear to auscultation bilaterally, requiring nasal cannula oxygen, respiratory effort non-stridorous Cardio: regular rate and rhythm, S1, S2 normal, no murmur, click, rub or gallop, resolving edema and no cyanosis GI: soft and nondistended, non-tender; bowel sounds  normal; no masses,  no organomegaly Musculoskeletal: extremities normal, atraumatic and symmetric Neurologic: Grossly normal  Lab Results:  Basename 09/22/11 0550  WBC 11.1*  HGB 9.6*  HCT 30.0*  PLT 307   BMET  Basename 09/23/11 0357 09/22/11 0550  NA 136 134*  K 4.8 4.3  CL 87* 88*  CO2 26 23  GLUCOSE 152* 105*  BUN 99* 99*  CREATININE 19.21* 19.69*  CALCIUM 9.4 9.1    Studies/Results: Dg Chest Port 1 View  09/22/2011  *RADIOLOGY REPORT*  Clinical Data: Respiratory difficulty.  Question edema.  PORTABLE CHEST - 1 VIEW  Comparison: 09/20/2011.  Findings: Poor inspiratory slightly motion degraded portable examination.  Elevated right hemidiaphragm.  Cardiomegaly.  Central pulmonary vascular prominence.  No gross pneumothorax.  No mid to upper lung consolidation detected.  Limited evaluation lung bases.  The patient would eventually benefit from two-view chest with better inspiration.  Tortuous aorta.  IMPRESSION: Portable exam as detailed above demonstrates cardiomegaly and central pulmonary vascular prominence.  Limited evaluation lung bases.  Original Report Authenticated By: Fuller Canada, M.D.   Medications: I have reviewed the patient's current medications.  Assessment/Plan:  Acute respiratory failure with hypoxia and hypercarbia *Multifactorial etiology related to acute systolic heart failure brought about by noncompliance with dialysis as well as a degree of right-sided heart failure from untreated obstructive sleep apnea *Continue nasal cannula oxygen and treat underlying causes * PCXR repeated today 09/22/2011 demonstrated stable cardiomegaly with central pulmonary vascular prominence  Hypotension *Resolved-was started on midodrine on 09/20/2011 *Orthostatic vital signs did demonstrate a 20 mmHg drop from lying to  sitting *Over a period of 48 hours did require 2 separate 500 cc normal saline challenges *Cortisol level within normal limits *Likely related to volume  depletion post aggressive volume removal for acute heart failure during this admission *It is noted that this patient's weight has decreased from 151.3 kg at presentation to 133 kg  Acute delirium *Resolved after correction of hypotension *Unclear if related to solitary dose of Solu-Medrol given earlier 09/20/2011 or was a complication of his persistent hypotension *Also has h/o hypercarbic respiratory failure this admit and has not been wearing his CPAP -an ABG was obtained on 09/20/2011 which demonstrated stable hypercarbia with a PCO2 of 50 which is at this patient's baseline. 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  *He is to be reintubated if he develops further respiratory distress.  *Need this problem to improve significantly before transfer out of stepdown and discharged home                                                                                                                 Intermittent fever - ? 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 Spoke with  Nephrology at [facility], his specific nephrologist will not be back until next Tuesday.   History of HTN (hypertension) *Blood pressure actually soft with systolic blood pressures in the 90s with appropriate reduction in fluid volume with peritoneal dialysis  Right heart failure/Pulmonary HTN/ Mitral regurgitation (MODERATE) *This is also associated with moderate to severe tricuspid regurgitation as well as moderate to severely dilated left/right atrium and mildly dilated right ventricle which is c/w underlying right heart failure-measure 50 mmHg in 2011 *Pulmonary hypertension has progressed in one year-now measuring 60 mmHg based on echocardiogram this admission *pt has been warned (in very clear terms - "not using CPAP will shorten your llfe") that this will worsen without CPAP use, but he makes it clear in a very animated and negative fashion that he has no interest in using CPAP either here or at home   LVH (left ventricular hypertrophy) MODERATE *No evidence of diastolic dysfunction on recent echocardiogram  Anemia *Primarily mediated by chronic kidney disease *Continue Aranesp *Would like to keep hemoglobin greater than 7 if possible - transfuse as needed  Obesity  with BMI of 37.6, adult/OSA/OHS  *Pulmonary suspects the patient has underlying obesity hypoventilation syndrome as well as sleep apnea *Pulmonary recommends formal sleep study as an outpatient but pt states very clearly that he is not interested in a sleep study or CPAP   Disposition *Remain in step down   LOS: 16 days  09/23/2011, 1:45 PM Junious Silk, ANP Pager: [phone]  I have examined the patient and reviewed the chart, I agree with the above note.   Calvert Cantor, MD [phone]

## 2011-09-23 NOTE — Progress Notes (Signed)
Clinical Social Work  CSW reviewed chart which stated PT recommended HH vs 24 hour care. CSW met with patient at bedside who reported that he has a Therapist, nutritional and sister is involved in care. Patient refused SNF and reported that he would never go to SNF. Patient reports that he feels safe going home with care from CNA and family. CSW is signing off but available if needed.  [place], [place] [phone]

## 2011-09-23 NOTE — Progress Notes (Signed)
RT Note: pt placed on CPAP 8cmh20 with a full face mask.  Pt tolerating well, RT will continue to monitor.

## 2011-09-24 DIAGNOSIS — J383 Other diseases of vocal cords: Secondary | ICD-10-CM

## 2011-09-24 LAB — RENAL FUNCTION PANEL
Albumin: 3.4 g/dL — ABNORMAL LOW (ref 3.5–5.2)
BUN: 105 mg/dL — ABNORMAL HIGH (ref 6–23)
Phosphorus: 8.3 mg/dL — ABNORMAL HIGH (ref 2.3–4.6)
Potassium: 4.3 mEq/L (ref 3.5–5.1)
Sodium: 136 mEq/L (ref 135–145)

## 2011-09-24 LAB — BODY FLUID CULTURE

## 2011-09-24 NOTE — Progress Notes (Signed)
Patient unable to stay on bipap. Placed back on 2L O2 nasal cannula. Sats 98% and patient resting. RT aware. Will continue to monitor.  Leta Baptist, RN

## 2011-09-24 NOTE — Progress Notes (Signed)
Subjective:  No cos, wanting to walk with nurse, no sob , appetite good Objective Vital signs in last 24 hours: Filed Vitals:   09/24/11 0011 09/24/11 0439 09/24/11 0733 09/24/11 1142  BP: 146/83 135/81    Pulse: 89 92    Temp: 98.4 F (36.9 C) 98 F (36.7 C) 98.1 F (36.7 C) 98.2 F (36.8 C)  TempSrc: Oral Oral Oral Oral  Resp: 22 22    Height:      Weight:      SpO2: 98% 91%     Weight change:   Intake/Output Summary (Last 24 hours) at 09/24/11 1223 Last data filed at 09/23/11 2011  Gross per 24 hour  Intake    243 ml  Output      1 ml  Net    242 ml   Labs: Basic Metabolic Panel:  Lab 09/24/11 1610 09/23/11 0357 09/22/11 0550  NA 136 136 134*  K 4.3 4.8 4.3  CL 91* 87* 88*  CO2 23 26 23   GLUCOSE 147* 152* 105*  BUN 105* 99* 99*  CREATININE 19.53* 19.21* 19.69*  CALCIUM 9.2 9.4 9.1  ALB -- -- --  PHOS 8.3* 8.0* 9.0*   Liver Function Tests:  Lab 09/24/11 0850 09/23/11 0357 09/22/11 0550  AST -- -- --  ALT -- -- --  ALKPHOS -- -- --  BILITOT -- -- --  PROT -- -- --  ALBUMIN 3.4* 3.4* 3.3*   No results found for this basename: LIPASE:3,AMYLASE:3 in the last 168 hours No results found for this basename: AMMONIA:3 in the last 168 hours CBC:  Lab 09/22/11 0550 09/19/11 0435 09/18/11 0355  WBC 11.1* 11.6* 11.7*  NEUTROABS -- -- --  HGB 9.6* 10.3* 11.0*  HCT 30.0* 33.6* 34.2*  MCV 90.4 91.3 92.4  PLT 307 314 323   Cardiac Enzymes: No results found for this basename: CKTOTAL:5,CKMB:5,CKMBINDEX:5,TROPONINI:5 in the last 168 hours CBG: No results found for this basename: GLUCAP:5 in the last 168 hours  Iron Studies: No results found for this basename: IRON,TIBC,TRANSFERRIN,FERRITIN in the last 72 hours Studies/Results: Dg Chest Port 1 View  09/22/2011  *RADIOLOGY REPORT*  Clinical Data: Respiratory difficulty.  Question edema.  PORTABLE CHEST - 1 VIEW  Comparison: 09/20/2011.  Findings: Poor inspiratory slightly motion degraded portable examination.   Elevated right hemidiaphragm.  Cardiomegaly.  Central pulmonary vascular prominence.  No gross pneumothorax.  No mid to upper lung consolidation detected.  Limited evaluation lung bases.  The patient would eventually benefit from two-view chest with better inspiration.  Tortuous aorta.  IMPRESSION: Portable exam as detailed above demonstrates cardiomegaly and central pulmonary vascular prominence.  Limited evaluation lung bases.  Original Report Authenticated By: Fuller Canada, M.D.   Medications:    . dialysis solution 1.5% low-MG/low-CA 10,000 mL (09/23/11 1950)  . dialysis solution 2.5% low-MG/low-CA 10,000 mL (09/23/11 1951)      . antiseptic oral rinse  15 mL Mouth Rinse BID  . aspirin  325 mg Oral Daily  . calcitRIOL  0.25 mcg Oral Custom  . darbepoetin (ARANESP) injection - DIALYSIS  150 mcg Subcutaneous Q Thu-HD  . dialysis solution for CAPD/CCPD [facility])   Peritoneal Dialysis QID  . dialysis solution 1.5% low-MG/low-CA   Intraperitoneal Once in dialysis  . dialysis solution for CAPD/CCPD [facility])   Peritoneal Dialysis Daily  . famotidine  20 mg Oral Daily  . feeding supplement  1 Container Oral Q24H  . heparin 500 unit irrigation   Irrigation Once  .  methylPREDNISolone (SOLU-MEDROL) injection  40 mg Intravenous Q12H  . multivitamin  1 tablet Oral Daily  . sevelamer  2,400 mg Oral TID WC  . sodium chloride  500 mL Intravenous Once  . sodium chloride  3 mL Intravenous Q12H  . sorbitol  30 mL Oral Once   I  have reviewed scheduled and prn medications.  Physical Exam: General: Alert. Nad .Stridot+ no dyspnea Heart: RRR  Lungs: Upper airway  Sounds bilat , but no apparent rales Abdomen: obese, soft , nontender Extremities: Dialysis Access: No pedal edema/ PD Cath in abd    Assessment/Plan  1. ESRD - CCPD  Exchanges yesterday 2. Pulm edema/volume excess- resolved, down 17kg from admission. Last wt  134kg 7/03-13 recorded  i see. Needing daily/  ? Poor Solute Clearances  with  Cr now 19.53 with 105 bun no uremic symptoms or asterixis/ continue CCPD  6 exchanges overnight as yesterday. Once his VC issues stabilize, if he remains stable on CCPD without signs of uremia, I would say patient is ready for discharge from a renal standpoint. I would rather he f/u with his primary nephrologists in [place] and they can then pursue further the question of whether PD is the best modality for his RRT or not.  I think we've done all here that we need to do for now with regards to his dialysis.  3. Stridor/VC edema- Better symptomatically per Primary and ENT on IV steroids 4. Anemia- on aranesp 150/wk, Hb 9's  TSaturation  9 % with 779 Ferritin 09/12/11 / given iron   5. Secondary hyperparathyroidism- continue PO calcitriol and renvela plus renavite . Ca  9.2 and phos 8.3 Alb 3.4 6. HTN/volume- BP 135/81now. He was on clonidine 0.2bid, labetalol 300bid and procardia 60/d at home. Now on midodrine for low BP's, prob due to vol depletion. Yesterday stopped midodrine and follow BP.  7. Respiratory failure- Prior hx OSA/BIPAP   Lenny Pastel, PA-C [facility] [phone] 09/24/2011,12:23 PM  LOS: 17 days   Patient seen and examined and agree with assessment and plan as above with additions as indicated.  [name]  MD [employer] [phone] pgr    [phone] cell 09/24/2011, 2:27 PM

## 2011-09-24 NOTE — Progress Notes (Signed)
Patient refusing CPAP therapy at this time.  Patient was advised to call if he changed his mind.

## 2011-09-24 NOTE — Progress Notes (Signed)
Subjective: Pt reports breathing better this morning. Denies chest pain.  Objective: Vital signs in last 24 hours: Temp:  [98 F (36.7 C)-98.7 F (37.1 C)] 98.1 F (36.7 C) (07/06 0733) Pulse Rate:  [86-107] 92  (07/06 0439) Resp:  [20-23] 22  (07/06 0439) BP: (105-146)/(62-83) 135/81 mmHg (07/06 0439) SpO2:  [91 %-99 %] 91 % (07/06 0439)  Physical Exam  Constitutional: He is oriented to person, place, and time. He appears in no distress.  HENT: His pupils are equal, round, reactive to light. Extraocular motion is intact. Examination of the ears shows normal auricles and external auditory canals bilaterally. Nasal examination shows normal mucosa, septum, turbinates. Facial examination shows no asymmetry. Palpation of the face elicit no significant tenderness. Oral cavity examination shows no mucosal lacerations. No significant trismus is noted. Palpation of the neck reveals no lymphadenopathy or mass. The trachea is midline. The thyroid is not significantly enlarged. Cranial nerves 2-12 are all grossly in tact.  Pt still has stridorous breathing, but not as labored. Head: Normocephalic and atraumatic.  Cardiovascular: Normal rate, regular rhythm and normal heart sounds.  Lymphadenopathy: He has no cervical adenopathy.  Neurological: He is alert and oriented to person, place, and time. No cranial nerve deficit. Coordination normal.    Basename 09/22/11 0550  WBC 11.1*  HGB 9.6*  HCT 30.0*  PLT 307    Basename 09/23/11 0357 09/22/11 0550  NA 136 134*  K 4.8 4.3  CL 87* 88*  CO2 26 23  GLUCOSE 152* 105*  BUN 99* 99*  CREATININE 19.21* 19.69*  CALCIUM 9.4 9.1    Medications:  I have reviewed the patient's current medications. Scheduled:   . antiseptic oral rinse  15 mL Mouth Rinse BID  . aspirin  325 mg Oral Daily  . calcitRIOL  0.25 mcg Oral Custom  . darbepoetin (ARANESP) injection - DIALYSIS  150 mcg Subcutaneous Q Thu-HD  . dialysis solution for CAPD/CCPD [facility])    Peritoneal Dialysis QID  . dialysis solution 1.5% low-MG/low-CA   Intraperitoneal Once in dialysis  . dialysis solution for CAPD/CCPD [facility])   Peritoneal Dialysis Daily  . famotidine  20 mg Oral Daily  . feeding supplement  1 Container Oral Q24H  . heparin 500 unit irrigation   Irrigation Once  . methylPREDNISolone (SOLU-MEDROL) injection  40 mg Intravenous Q12H  . multivitamin  1 tablet Oral Daily  . sevelamer  2,400 mg Oral TID WC  . sodium chloride  500 mL Intravenous Once  . sodium chloride  3 mL Intravenous Q12H  . sorbitol  30 mL Oral Once  . DISCONTD: midodrine  5 mg Oral TID WC    Assessment/Plan: Respiratory stridor secondary to bilateral vocal cord edema and paresis. Mildly improved with steroid treatment. Will follow.    LOS: 17 days   Annely Sliva,SUI W 09/24/2011, 8:17 AM

## 2011-09-25 LAB — CBC
HCT: 28.8 % — ABNORMAL LOW (ref 39.0–52.0)
MCH: 29.3 pg (ref 26.0–34.0)
MCHC: 32.3 g/dL (ref 30.0–36.0)
MCV: 90.9 fL (ref 78.0–100.0)
RDW: 17.5 % — ABNORMAL HIGH (ref 11.5–15.5)

## 2011-09-25 LAB — RENAL FUNCTION PANEL
Calcium: 9.3 mg/dL (ref 8.4–10.5)
GFR calc Af Amer: 3 mL/min — ABNORMAL LOW (ref >90)
Glucose, Bld: 117 mg/dL — ABNORMAL HIGH (ref 70–99)
Phosphorus: 7.2 mg/dL — ABNORMAL HIGH (ref 2.3–4.6)
Sodium: 134 mEq/L — ABNORMAL LOW (ref 135–145)

## 2011-09-25 MED ORDER — PREDNISONE 20 MG PO TABS: 40.0000 mg | ORAL_TABLET | Freq: Every day | ORAL | Status: DC

## 2011-09-25 MED ORDER — PREDNISONE 20 MG PO TABS
40.0000 mg | ORAL_TABLET | Freq: Every day | ORAL | Status: DC
  Administered 2011-09-25: 40 mg via ORAL
  Filled 2011-09-25 (×2): qty 2

## 2011-09-25 MED ORDER — PREDNISONE 20 MG PO TABS: 40.0000 mg | ORAL_TABLET | Freq: Every day | ORAL | Status: AC

## 2011-09-25 MED ORDER — ALBUTEROL SULFATE HFA 108 (90 BASE) MCG/ACT IN AERS: 2.0000 | INHALATION_SPRAY | Freq: Four times a day (QID) | RESPIRATORY_TRACT | Status: DC | PRN

## 2011-09-25 NOTE — Progress Notes (Signed)
[id number] Pt had been told by Dr. Arlean Hopping that on their standpoint he can go home. Pt changed on his street clothes and will not place tele back on. Dr. Butler Denmark made aware of Dr. Arlean Hopping notes. Waiting for response.Informed pt.

## 2011-09-25 NOTE — Progress Notes (Signed)
Pt d/c home. Faxed orders, facesheet and f12f to [employer].  No d/c summary available. Isidoro Donning RN CCM Case Mgmt phone [phone]

## 2011-09-25 NOTE — Progress Notes (Signed)
Subjective: Pt resting comfortably in bed.  Stridor decreased. No SOB.  Objective: Vital signs in last 24 hours: Temp:  [97.7 F (36.5 C)-98.5 F (36.9 C)] 97.7 F (36.5 C) (07/06 2119) Pulse Rate:  [81-92] 83  (07/06 2119) Resp:  [21-23] 21  (07/06 2119) BP: (130-145)/(79-89) 145/79 mmHg (07/06 2119) SpO2:  [91 %-97 %] 94 % (07/06 2119)  Physical Exam  HENT: His pupils are equal, round, reactive to light. Extraocular motion is intact. Examination of the ears shows normal auricles and external auditory canals bilaterally. Nasal examination shows normal mucosa, septum, turbinates. Facial examination shows no asymmetry. Palpation of the face elicit no significant tenderness. Oral cavity examination shows no mucosal lacerations. No significant trismus is noted. Palpation of the neck reveals no lymphadenopathy or mass. The trachea is midline. The thyroid is not significantly enlarged. Cranial nerves 2-12 are all grossly in tact.  Pt still has mild stridorous breathing, but not labored.  Head: Normocephalic and atraumatic.  Cardiovascular: Normal rate, regular rhythm and normal heart sounds.  Lymphadenopathy: He has no cervical adenopathy.   Basename 09/22/11 0550  WBC 11.1*  HGB 9.6*  HCT 30.0*  PLT 307    Basename 09/24/11 0850 09/23/11 0357  NA 136 136  K 4.3 4.8  CL 91* 87*  CO2 23 26  GLUCOSE 147* 152*  BUN 105* 99*  CREATININE 19.53* 19.21*  CALCIUM 9.2 9.4    Medications:  I have reviewed the patient's current medications. Scheduled:   . antiseptic oral rinse  15 mL Mouth Rinse BID  . aspirin  325 mg Oral Daily  . calcitRIOL  0.25 mcg Oral Custom  . darbepoetin (ARANESP) injection - DIALYSIS  150 mcg Subcutaneous Q Thu-HD  . dialysis solution for CAPD/CCPD [facility])   Peritoneal Dialysis QID  . dialysis solution 1.5% low-MG/low-CA   Intraperitoneal Once in dialysis  . dialysis solution for CAPD/CCPD [facility])   Peritoneal Dialysis Daily  . famotidine  20 mg Oral  Daily  . feeding supplement  1 Container Oral Q24H  . heparin 500 unit irrigation   Irrigation Once  . methylPREDNISolone (SOLU-MEDROL) injection  40 mg Intravenous Q12H  . multivitamin  1 tablet Oral Daily  . sevelamer  2,400 mg Oral TID WC  . sodium chloride  500 mL Intravenous Once  . sodium chloride  3 mL Intravenous Q12H  . sorbitol  30 mL Oral Once    Assessment/Plan: Respiratory stridor improved. Consider switching to oral prednisone dosepak in anticipation of discharge.  Pt may f/u with me as an outpatient 1 week after discharge.   LOS: 18 days   Khandi Kernes,SUI W 09/25/2011, 1:43 AM

## 2011-09-25 NOTE — Progress Notes (Signed)
[facility]  Long TEAM 1 - Stepdown/ICU TEAM  PCP: Geraldo Pitter, MD  Interval History: Patient has history of HTN and ESRD on PD. In the past patient had received his care at [facility] and had been hospitalized there about a year ago with fluid overload and possible CHF. Recently he been going to a renal doctor in [facility]). He gets peritoneal dialysis 4 times a day. He has noticed a gain of about 20 pounds recently and started to have worsening shortness of breath for past 24 hours. He presented to [facility] emergency department and was found to be fluid overloaded. Hospitalist was called to admit. Dr. Allena Katz with renal recommended transferred to [facility]. On 6/20 he developed worsening resp distress with bilat infiltrates most c/w edema. He failed NPPV and required intubation. He was extubated on 6/26. It was later discovered that the patient was not compliant with peritoneal dialysis at home.  Subjective: States he feels much better in regard to stridor and breathing.   Objective: Blood pressure 148/88, pulse 86, temperature 98.2 F (36.8 C), temperature source Oral, resp. rate 20, height 6\' 2"  (1.88 m), weight 134 kg (295 lb 6.7 oz), SpO2 93.00%.  Intake/Output from previous day: 07/06 0701 - 07/07 0700 In: 1120 [P.O.:1120] Out: 2 [Stool:2] Intake/Output this shift:   General appearance: alert,  no distress  HEENT: Raspy sounding vocal quality - improving. Resp: clear to auscultation bilaterally, requiring nasal cannula oxygen, respiratory effort non-stridorous Cardio: regular rate and rhythm, S1, S2 normal, no murmur, click, rub or gallop, resolving edema and no cyanosis GI: soft and nondistended, non-tender; bowel sounds normal; no masses,  no organomegaly Musculoskeletal: extremities normal, atraumatic and symmetric Neurologic: Grossly normal  Lab Results:  Basename 09/25/11 0540  WBC 13.6*  HGB 9.3*  HCT 28.8*  PLT 257   BMET  Basename 09/24/11  0850 09/23/11 0357  NA 136 136  K 4.3 4.8  CL 91* 87*  CO2 23 26  GLUCOSE 147* 152*  BUN 105* 99*  CREATININE 19.53* 19.21*  CALCIUM 9.2 9.4    Studies/Results: No results found. Medications: I have reviewed the patient's current medications.  Assessment/Plan:  Acute respiratory failure with hypoxia and hypercarbia *Multifactorial etiology related to acute systolic heart failure brought about by noncompliance with dialysis as well as a degree of right-sided heart failure from untreated obstructive sleep apnea *Continue nasal cannula oxygen and treat underlying causes  Hypotension *Resolved-was started on midodrine on 09/20/2011 *Orthostatic vital signs did demonstrate a 20 mmHg drop from lying to sitting *Over a period of 48 hours did require 2 separate 500 cc normal saline challenges *Cortisol level within normal limits *Likely related to volume depletion post aggressive volume removal for acute heart failure during this admission *It is noted that this patient's weight has decreased from 151.3 kg at presentation to 133 kg  Acute delirium *Resolved after correction of hypotension *Unclear if related to solitary dose of Solu-Medrol given earlier 09/20/2011 or was a complication of his persistent hypotension *Also has h/o hypercarbic respiratory failure this admit and has not been wearing his CPAP -an ABG was obtained on 09/20/2011 which demonstrated stable hypercarbia with a PCO2 of 50 which is at this patient's baseline. 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  *He is to be reintubated if he develops further respiratory distress.    *Need this problem to improve significantly before transfer out of stepdown and discharged home                                                                                                                 Intermittent fever - ? 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   History of HTN (hypertension) BP rising likely due to steroids - follow  Right heart failure/Pulmonary HTN/ Mitral regurgitation (MODERATE) *This is also associated with moderate to severe tricuspid regurgitation as well as moderate to severely dilated left/right atrium and mildly dilated right ventricle which is c/w underlying right heart failure-measure 50 mmHg in 2011 *Pulmonary hypertension has progressed in one year-now measuring 60 mmHg based on echocardiogram this admission *pt has been warned (in very clear terms - "not using CPAP will shorten your llfe") that this will worsen without CPAP use, but he makes it clear in a very animated and negative fashion that he has no interest in using CPAP either here or at home   LVH (left ventricular hypertrophy) MODERATE *No evidence of diastolic dysfunction on recent echocardiogram  Anemia *Primarily mediated by chronic kidney disease *Continue Aranesp *Would like to keep hemoglobin greater than 7 if possible - transfuse as needed  Obesity with BMI of 37.6, adult/OSA/OHS   *Pulmonary suspects the patient has underlying obesity hypoventilation syndrome as well as sleep apnea *Pulmonary recommends formal sleep study as an outpatient but pt states very clearly that he is not interested in a sleep study or CPAP   Disposition *transfer out of SDU   LOS: 18 days  09/25/2011, 7:56 AM  Calvert Cantor, MD [phone]

## 2011-09-25 NOTE — Progress Notes (Signed)
Subjective:  Wants to go home, stridor much better Objective Vital signs in last 24 hours: Filed Vitals:   09/24/11 1823 09/24/11 2119 09/25/11 0600 09/25/11 0945  BP: 142/89 145/79 148/88 128/80  Pulse: 81 83 86 90  Temp: 98.5 F (36.9 C) 97.7 F (36.5 C) 98.2 F (36.8 C) 98.4 F (36.9 C)  TempSrc: Oral Oral Oral Oral  Resp: 21 21 20 20   Height:      Weight:      SpO2: 95% 94% 93% 96%   Weight change:   Intake/Output Summary (Last 24 hours) at 09/25/11 1427 Last data filed at 09/25/11 0945  Gross per 24 hour  Intake    810 ml  Output      3 ml  Net    807 ml   Labs: Basic Metabolic Panel:  Lab 09/25/11 7829 09/24/11 0850 09/23/11 0357  NA 134* 136 136  K 4.3 4.3 4.8  CL 89* 91* 87*  CO2 24 23 26   GLUCOSE 117* 147* 152*  BUN 115* 105* 99*  CREATININE 20.16* 19.53* 19.21*  CALCIUM 9.3 9.2 9.4  ALB -- -- --  PHOS 7.2* 8.3* 8.0*   Liver Function Tests:  Lab 09/25/11 0500 09/24/11 0850 09/23/11 0357  AST -- -- --  ALT -- -- --  ALKPHOS -- -- --  BILITOT -- -- --  PROT -- -- --  ALBUMIN 3.4* 3.4* 3.4*   No results found for this basename: LIPASE:3,AMYLASE:3 in the last 168 hours No results found for this basename: AMMONIA:3 in the last 168 hours CBC:  Lab 09/25/11 0540 09/22/11 0550 09/19/11 0435  WBC 13.6* 11.1* 11.6*  NEUTROABS -- -- --  HGB 9.3* 9.6* 10.3*  HCT 28.8* 30.0* 33.6*  MCV 90.9 90.4 91.3  PLT 257 307 314   Cardiac Enzymes: No results found for this basename: CKTOTAL:5,CKMB:5,CKMBINDEX:5,TROPONINI:5 in the last 168 hours CBG: No results found for this basename: GLUCAP:5 in the last 168 hours  Iron Studies: No results found for this basename: IRON,TIBC,TRANSFERRIN,FERRITIN in the last 72 hours Studies/Results: No results found. Medications:    . dialysis solution 1.5% low-MG/low-CA 10,000 mL (09/23/11 1950)  . dialysis solution 2.5% low-MG/low-CA 10,000 mL (09/23/11 1951)      . antiseptic oral rinse  15 mL Mouth Rinse BID  .  aspirin  325 mg Oral Daily  . calcitRIOL  0.25 mcg Oral Custom  . darbepoetin (ARANESP) injection - DIALYSIS  150 mcg Subcutaneous Q Thu-HD  . dialysis solution for CAPD/CCPD [facility])   Peritoneal Dialysis QID  . dialysis solution 1.5% low-MG/low-CA   Intraperitoneal Once in dialysis  . dialysis solution for CAPD/CCPD [facility])   Peritoneal Dialysis Daily  . famotidine  20 mg Oral Daily  . feeding supplement  1 Container Oral Q24H  . heparin 500 unit irrigation   Irrigation Once  . multivitamin  1 tablet Oral Daily  . predniSONE  40 mg Oral Q breakfast  . sevelamer  2,400 mg Oral TID WC  . sodium chloride  500 mL Intravenous Once  . sodium chloride  3 mL Intravenous Q12H  . sorbitol  30 mL Oral Once  . DISCONTD: methylPREDNISolone (SOLU-MEDROL) injection  40 mg Intravenous Q12H   I  have reviewed scheduled and prn medications.  Physical Exam: General: Alert. Nad .Stridot+ no dyspnea Heart: RRR  Lungs: Upper airway  Sounds bilat , but no apparent rales Abdomen: obese, soft , nontender Extremities: Dialysis Access: No pedal edema/ PD Cath in abd  Assessment/Plan  1. ESRD on CAPD- ok for discharge from renal standoint. Patient will f/u with his nephrologist Dr. Louis Meckel tomorrow in [facility]. Says he has cycler training to start on 7/15 as well. Volume is controlled and creat is high but he is asymptomatic w/o uremic symptoms.    2. Pulm edema- resolved. Lost 17-18kg while here.  3. Stridor/VC edema- much better 4. Anemia- on aranesp 150/wk, Hb 9's  TSaturation  9 % with 779 Ferritin 09/12/11 / given iron   5. Secondary hyperparathyroidism- continue PO calcitriol and renvela plus renavite . Ca  9.2 and phos 8.3 Alb 3.4 6. HTN/volume- BP 135/81now. He was on clonidine 0.2bid, labetalol 300bid and procardia 60/d at home, all are currently on hold. BP is normal off of midodrine. Would resume one or two of his BP meds at time of discharge and let primary neph adjust further.   7. Respiratory failure- Prior hx OSA/BIPAP  [name]  MD [facility] [phone] pgr    [phone] cell 09/25/2011, 2:27 PM

## 2011-09-27 ENCOUNTER — Encounter (HOSPITAL_COMMUNITY): Payer: Self-pay | Admitting: *Deleted

## 2011-09-27 ENCOUNTER — Emergency Department (HOSPITAL_COMMUNITY)
Admission: EM | Admit: 2011-09-27 | Discharge: 2011-09-27 | Disposition: A | Discharge status: Home / Self Care | Payer: Medicare Other | Attending: Emergency Medicine | Admitting: Emergency Medicine

## 2011-09-27 DIAGNOSIS — R109 Unspecified abdominal pain: Secondary | ICD-10-CM | POA: Insufficient documentation

## 2011-09-27 DIAGNOSIS — Z09 Encounter for follow-up examination after completed treatment for conditions other than malignant neoplasm: Secondary | ICD-10-CM | POA: Insufficient documentation

## 2011-09-27 NOTE — ED Notes (Signed)
Patient and family members requesting patient be upgraded to acuity 2. Vital signs reassessed, spoke with charge RN. Pt in no distress in waiting room. Charge RN Thayer Ohm states patient will stay acuity 3. Patient and family updated on plan of care and approximate wait times.

## 2011-09-27 NOTE — ED Notes (Signed)
Patient now complaining of abdominal pain; family member upset in waiting room stating that her family member needs to get back immediately.  Charge nurse has been informed of the situation; no change in acuity level at this time.  Patient calm, cooperative, and in no distress at this time.  Will continue to monitor.

## 2011-09-27 NOTE — ED Notes (Signed)
Called patient 3 times and NO ANSWER. 

## 2011-09-27 NOTE — ED Notes (Signed)
Went swimming today, and the cap of the peritoneal dialysis tubing came off and could not be found. Was told that the renal nurse must change. He is requesting antibiotics.

## 2011-09-28 NOTE — Discharge Summary (Signed)
Physician Discharge Summary  Gary Rivera Diss AVW:[id number] DOB: November 29, 1960 DOA: 09/07/2011  PCP: Geraldo Pitter, MD  Admit date: 09/07/2011 Discharge date: 09/28/2011  Recommendations for Outpatient Follow-up:  F/u with your nephrologist.   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 In the past patient had received his care at [facility] and had been hospitalized there about a year ago with fluid overload and possible CHF. Recently he been going to a renal doctor in [facility]). He gets peritoneal dialysis 4 times a day. He has noticed a gain of about 20 pounds recently and started to have worsening shortness of breath for past 24 hours. He presented to [facility] emergency department and was found to be fluid overloaded. Hospitalist was called to admit. Dr. Allena Katz with renal recommended transferred to [facility]. On 6/20 he developed worsening resp distress with bilat infiltrates most c/w edema. He failed NPPV and required intubation. He was extubated on 6/26. It was later discovered that the patient was not compliant with peritoneal dialysis at home.      Hospital Course:   Acute respiratory failure with hypoxia and hypercarbia  *Multifactorial  etiology related to acute systolic heart failure brought about by noncompliance with dialysis as well as a degree of right-sided heart failure from untreated obstructive sleep apnea    Hypotension  *Resolved-was started on midodrine on 09/20/2011 - this was eventually discontinued as his BP began to rise after steroids (see below) *Orthostatic vital signs did demonstrate a 20 mmHg drop from lying to sitting  *Over a period of 48 hours did require 2 separate 500 cc normal saline challenges  *Cortisol level within normal limits  *Likely related to volume depletion post aggressive volume removal for acute heart failure during this admission     Acute delirium  *Resolved after correction of hypotension  *Unclear if related to solitary dose of Solu-Medrol given earlier 09/20/2011 or was a complication of his persistent hypotension  *Also has h/o hypercarbic respiratory failure this admit and has not been wearing his CPAP -an ABG was obtained on 09/20/2011 which demonstrated stable hypercarbia with a PCO2 of 50 which is at this patient's baseline. He had associated pH around 7.2 which is more consistent with medical acidosis related to his chronic renal failure  *Briefly required a Sitter at the bedside   Post-extubation stridor and horseness  *ENT eval revealed b/l vocal cord edema with poor closure of the cord. Continuous steroids have improved this significantly.   Intermittent fever - ? PNA  * received 5 days of Cefepime (6/26- 7/1) with improvement in symptoms  Systolic congestive heart failure with reduced left ventricular function, NYHA class 1- EF 40-45% 2011 ECHO  *Echocardiogram this admission demonstrated stable EF of 40-45% with progression to mild diffuse hypokinesis-all of his consistent with progressive dilated cardiomyopathy   Elevated CPK:  myositis vs rhabdomyolosis  *Resolved   Thrombocytopenia  *Resolved-no evidence of gram-negative infection   CKD (chronic kidney disease)  requiring chronic PERITONEAL dialysis  *Cr rose from a baseline of 8 to 20 *It was discussed multiple times with the patient that hemodialysis would be beneficial but pt declined. In fact he fired the first nephrologist who introduced the option.  *Doing well with Cycler, Cr has stopped rising.  *It is noted that this patient's weight has decreased from 151.3 kg at presentation to 133 kg *he is asked to f/u with his regular nephrologist to further discuss management. I did attempt to transfer him to [facility] but due to a holiday weekend, his nephrologist was not available.   History of HTN (hypertension)  BP rising likely due to steroids -  Resumed his antihypertensives with exeception of Clonidine.   Right heart failure/Pulmonary HTN/ Mitral regurgitation (MODERATE)  *This is also associated with moderate to severe tricuspid regurgitation as well as moderate to severely dilated left/right atrium and mildly dilated right ventricle which is c/w underlying right heart failure-measure 50 mmHg in 2011  *Pulmonary hypertension has progressed in one year-now measuring 60 mmHg based on echocardiogram this admission  *pt has been warned (in very clear terms - "not using CPAP will shorten your llfe") that this will worsen without CPAP use, but he makes it clear in a very animated and negative fashion that he has no interest in using CPAP either here or at home   LVH (left ventricular hypertrophy) MODERATE (in the past) *No evidence of diastolic dysfunction on recent echocardiogram   Anemia  *Primarily mediated by chronic kidney disease  *Continue Aranesp  *Would like to keep hemoglobin greater than 7 if possible  Obesity with BMI of 37.6, adult/OSA/OHS  *Pulmonary suspects the patient has underlying obesity hypoventilation syndrome as well as sleep apnea  *Pulmonary recommends formal sleep study as an outpatient but pt states very clearly that he is not interested in a sleep study or  CPAP    Procedures: ECHO- 09/08/11 Study Conclusions  - Left ventricle: The cavity size was moderately dilated. Wall thickness was increased in a pattern of moderate LVH. Systolic function was mildly to moderately reduced. The estimated ejection fraction was in the range of 40% to 45%. Mild diffuse hypokinesis. - Aortic valve: Mild to moderate regurgitation. - Left atrium: The atrium was severely dilated. - Right ventricle: The cavity size was dilated. Wall thickness was normal. - Pulmonary arteries: Systolic pressure was moderately to severely increased. PA peak pressure: 60mm Hg (S).   Procedure: Flexible Fiberoptic Laryngoscopy 09/22/11 Anesthesia: Topical oxymetazoline and lidocaine  Indication: Persistent stridor, respiratory distress  Description: Risks, benefits, and alternatives of flexible endoscopy were explained to the patient. Specific mention was made of the risk of throat numbness with difficulty swallowing, possible bleeding from the nose and mouth, and pain from the procedure. The patient gave oral consent to proceed. The nasal cavities were decongested and anesthetised with a combination of oxymetazoline and 4% lidocaine solution. The flexible scope was inserted into the left nasal cavity and advanced towards the nasopharynx. Visualized mucosa over the turbinates and septum were normal. The nasopharynx was clear. Oropharyngeal walls were symmetric and mobile without lesion, mass, or edema. Hypopharynx was also without lesion or edema. No lesions or asymmetry in the supraglottic larynx. Arytenoid mucosa was edematous. Posterior commissure with edema and redundant mucosa. True vocal folds were edematous, erythematous, and stationary in paramedian position. Both vocal cords are not mobile.  Consultations:  [facility]phrology- [facility]  ENT- Dr Marguerita Merles PCCM team  Discharge Exam: Filed Vitals:   09/25/11 1410  BP: 120/80  Pulse: 87  Temp: 98.1  F (36.7 C)  Resp: 20   Filed Vitals:   09/24/11 2119 09/25/11 0600 09/25/11 0945 09/25/11 1410  BP: 145/79 148/88 128/80 120/80  Pulse: 83 86 90 87  Temp: 97.7 F (36.5 C) 98.2 F (36.8 C) 98.4 F (36.9 C) 98.1 F (36.7 C)  TempSrc: Oral Oral Oral Oral  Resp: 21 20 20 20   Height:      Weight:      SpO2: 94% 93% 96% 96%   General appearance: alert, no distress  Resp: clear to auscultation bilaterally - mild stridor noted  Cardio: regular rate and rhythm, S1, S2 normal, no murmur, click, rub or gallop  GI: soft and nondistended, non-tender; bowel sounds normal; no masses, no organomegaly  Extremities: extremities normal, atraumatic, mild edema Neurologic: Grossly normal   Discharge Instructions  Discharge Orders    Future Orders Please Complete By Expires   Diet - low sodium heart healthy - renal      Increase activity slowly      Discharge instructions      Comments:   Follow up with your doctor in 2-3 days Do not eat salty or fatty food.     Medication List  As of 09/28/2011 10:02 AM   STOP taking these medications         cloNIDine 0.2 MG tablet         TAKE these medications         albuterol 108 (90 BASE) MCG/ACT inhaler   Commonly known as: PROVENTIL HFA;VENTOLIN HFA   Inhale 2 puffs into the lungs every 6 (six) hours as needed for wheezing.      aspirin 325 MG tablet   Take 325 mg by mouth daily.      b complex-vitamin c-folic acid 0.8 MG Tabs   Take 0.8 mg by mouth daily.      calcitRIOL 0.25 MCG capsule   Commonly known as: ROCALTROL   Take 0.25 mcg by mouth See admin instructions. Takes 1 capsule daily Monday through friday      calcium carbonate 500 MG chewable tablet   Commonly known as: TUMS - dosed in mg elemental calcium   Chew 1 tablet by mouth 3 (three) times daily with meals.      clonazePAM 0.5 MG tablet   Commonly known as: KLONOPIN   Take 0.5 mg by mouth 2 (two) times daily as needed. anxiety      HYDROcodone-acetaminophen 5-500  MG per tablet   Commonly known as: VICODIN   Take 1 tablet by mouth every 6 (six) hours as needed. pain      labetalol 300 MG tablet   Commonly known as: NORMODYNE   Take 300 mg by mouth 2 (two) times daily.      NIFEdipine 60 MG 24 hr tablet   Commonly known as: PROCARDIA-XL/ADALAT CC   Take 60 mg by mouth daily.      predniSONE 20 MG tablet   Commonly known as: DELTASONE   Take 2 tablets (40 mg total) by mouth daily with breakfast.      sevelamer 800 MG tablet   Commonly known as: RENVELA   Take 800 mg by mouth See admin instructions. Takes 1 tablet tid with meals and snacks  The results of significant diagnostics from this hospitalization (including imaging, microbiology, ancillary and laboratory) are listed below for reference.    Significant Diagnostic Studies: Dg Chest 2 View  09/07/2011  *RADIOLOGY REPORT*  Clinical Data: Shortness of breath.  Anxiety.  Congestive heart failure.  Hypertension.  CHEST - 2 VIEW  Comparison: 12/07/2009  Findings: Moderate cardiomegaly stable.  New diffuse mixed interstitial and airspace disease is seen bilaterally, consistent with diffuse pulmonary edema.  No evidence of pleural effusion.  IMPRESSION:  Acute pulmonary edema and stable cardiomegaly, consistent with congestive heart failure.  Original Report Authenticated By: Danae Orleans, M.D.   Dg Chest Port 1 View  09/22/2011  *RADIOLOGY REPORT*  Clinical Data: Respiratory difficulty.  Question edema.  PORTABLE CHEST - 1 VIEW  Comparison: 09/20/2011.  Findings: Poor inspiratory slightly motion degraded portable examination.  Elevated right hemidiaphragm.  Cardiomegaly.  Central pulmonary vascular prominence.  No gross pneumothorax.  No mid to upper lung consolidation detected.  Limited evaluation lung bases.  The patient would eventually benefit from two-view chest with better inspiration.  Tortuous aorta.  IMPRESSION: Portable exam as detailed above demonstrates cardiomegaly and  central pulmonary vascular prominence.  Limited evaluation lung bases.  Original Report Authenticated By: Fuller Canada, M.D.   Dg Chest Port 1 View  09/20/2011  *RADIOLOGY REPORT*  Clinical Data: Hypoxia. Shortness of breath.  PORTABLE CHEST - 1 VIEW  Comparison: 09/18/2011.  Findings: Poor inspiratory portable examination.  Cardiomegaly.  Central pulmonary vascular prominence.  Limited evaluation left lung base.  No gross pneumothorax.  Mild tortuosity descending thoracic aorta.  IMPRESSION: Cardiomegaly with central pulmonary vascular prominence.  Original Report Authenticated By: Fuller Canada, M.D.   Dg Chest Port 1 View  09/18/2011  *RADIOLOGY REPORT*  Clinical Data: Evaluate for pneumonia.  PORTABLE CHEST - 1 VIEW  Comparison: Chest x-ray 09/17/2011.  Findings: Lung volumes are low.  There are bibasilar opacities (left greater than right), favored to predominately reflect atelectasis, although superimposed air space consolidation (particularly in the left lower lobe) is difficult to exclude. Possible small left-sided pleural effusion.  Pulmonary venous congestion (likely accentuated by low lung volumes), without frank pulmonary edema.  Heart size is mildly enlarged (unchanged). Mediastinal contours are unremarkable.  IMPRESSION: 1.  Low lung volumes with bibasilar opacities (left greater than right) which it is favored to represent areas of atelectasis, however, underlying airspace consolidation from aspiration or infection is difficult to entirely exclude (particularly in the left lower lobe). 2.  Possible small left pleural effusion. 3.  Mild cardiomegaly is unchanged.  Original Report Authenticated By: Florencia Reasons, M.D.   Dg Chest Port 1 View  09/17/2011  *RADIOLOGY REPORT*  Clinical Data: Respiratory failure  PORTABLE CHEST - 1 VIEW  Comparison: 09/15/2011  Findings: The right central line has been removed.  Low lung volumes with persistent patchy atelectasis or infiltrates in the lung  bases.  Mild cardiomegaly stable.  No definite effusion or overt interstitial edema.  IMPRESSION:  1.  Interval removal of central line.  Otherwise stable appearance since previous exam.  Original Report Authenticated By: Osa Craver, M.D.   Dg Chest Port 1 View  09/15/2011  *RADIOLOGY REPORT*  Clinical Data: Follow up of respiratory failure.  PORTABLE CHEST - 1 VIEW  Comparison: 09/13/2011  Findings: Right IJ central line unchanged.  Extubation and removal of nasogastric tube.  Left IJ line has also been removed.  Mild cardiomegaly.  No right and no definite left pleural effusion.  No congestive failure.  Low lung volumes with resultant pulmonary interstitial prominence. Patchy areas of lower lobe predominant atelectasis are not significantly changed.  IMPRESSION:  1.  Extubation. 2.  Cardiomegaly with similar low lung volumes and bibasilar atelectasis.  Original Report Authenticated By: Consuello Bossier, M.D.   Dg Chest Port 1 View  09/13/2011  *RADIOLOGY REPORT*  Clinical Data: Follow up of respiratory failure.  PORTABLE CHEST - 1 VIEW  Comparison: 1 day prior  Findings: Endotracheal tube terminates at the level of the clavicles.  Bilateral internal jugular lines are unchanged.  A nasogastric tube extends beyond the  inferior aspect of the film.  Cardiomegaly accentuated by AP portable technique.  Right hemidiaphragm elevation is unchanged.  No right pleural effusion. Limited evaluation of the left pleural space. No pneumothorax. Improved to resolved pulmonary venous congestion.  Interstitial prominence remains and is favored to be due to low lung volumes. Right-sided infrahilar atelectasis and probable left base atelectasis.  IMPRESSION: Improved to resolved pulmonary venous congestion.  Persistent low lung volumes with patchy areas of atelectasis.  Cardiomegaly.  Original Report Authenticated By: Consuello Bossier, M.D.   Dg Chest Port 1 View  09/12/2011  *RADIOLOGY REPORT*  Clinical Data:  Intubated.  PORTABLE CHEST - 1 VIEW  Comparison: 09/11/2011  Findings: The support devices are unchanged.  Cardiomegaly with vascular congestion.  Low lung volumes with bibasilar atelectasis. No overt edema.  IMPRESSION: Cardiomegaly, vascular congestion.  Stable bibasilar atelectasis.  Original Report Authenticated By: Cyndie Chime, M.D.   Dg Chest Port 1 View  09/11/2011  *RADIOLOGY REPORT*  Clinical Data: Respiratory difficulty  PORTABLE CHEST - 1 VIEW  Comparison: Yesterday  Findings: Endotracheal tube, NG tube, right internal jugular vein central venous catheter, left internal jugular vein dialysis catheter are stable.  Bibasilar atelectasis and low volumes are worse.  Upper normal heart size.  No pneumothorax.  IMPRESSION: Worsening bibasilar atelectasis.  Original Report Authenticated By: Donavan Burnet, M.D.   Dg Chest Port 1 View  09/10/2011  *RADIOLOGY REPORT*  Clinical Data: Edema  PORTABLE CHEST - 1 VIEW  Comparison: 09/09/2011  Findings: Low volumes.  Mild cardiomegaly.  Stable endotracheal tube, right internal jugular vein center venous catheter and left internal jugular vein dialysis catheter.  NG tube placed beyond the gastroesophageal junction.  Pulmonary edema resolved.  Bibasilar atelectasis.  Left basilar opacity persists.  IMPRESSION: Diffuse edema resolved.  Persistent bibasilar atelectasis and left lower lobe opacity.  Original Report Authenticated By: Donavan Burnet, M.D.   Dg Chest Port 1 View  09/09/2011  *RADIOLOGY REPORT*  Clinical Data: Insertion of hemodialysis catheter.  Pulmonary edema.  PORTABLE CHEST - 1 VIEW  Comparison: 6/21, 6/20, and 09/07/2011  Findings: Double-lumen left internal jugular vein hemodialysis catheter has been inserted and the tips are in the superior vena cava.  Right jugular vein catheter tip is also in the superior vena cava.  The patient has bilateral pulmonary edema with pulmonary vascular congestion, slightly increased.  No pneumothorax.   Endotracheal tube is 2.7 cm above the carina.  IMPRESSION: Dialysis catheter tips in the superior vena cava.  No pneumothorax. Increased pulmonary edema.  Original Report Authenticated By: Gwynn Burly, M.D.   Dg Chest Port 1 View  09/09/2011  *RADIOLOGY REPORT*  Clinical Data: Endotracheal tube and central line placement.  PORTABLE CHEST - 1 VIEW  Comparison: Chest radiograph performed 09/08/2011  Findings: The patient's endotracheal tube is seen ending nearly 2 cm above the carina.  This  could be retracted approximately 1 cm. A right IJ line is noted ending within the right atrium; this should be retracted 2-3 cm.  The lungs are hypoexpanded.  Vascular congestion is noted, with dense bilateral central opacities and retrocardiac opacity.  This may reflect pulmonary edema or multifocal pneumonia.  A small left pleural effusion is suspected; no pneumothorax is seen.  The cardiomediastinal silhouette is borderline enlarged.  No acute osseous abnormalities are identified.  IMPRESSION:  1.  Endotracheal tube seen ending nearly 2 cm above the carina; this could be retracted approximately 1 cm. 2.  Right IJ line noted ending within the right atrium; this should be retracted 2-3 cm. 3.  Lungs hypoexpanded; vascular congestion, with worsening dense bilateral central opacities and retrocardiac opacity.  This may reflect pulmonary edema or multifocal pneumonia.  Suspect small left pleural effusion. 4.  Borderline cardiomegaly.  These results were called by telephone on 09/09/2011  at  06:04 a.m. to  [facility] on [id number], who verbally acknowledged these results.  Original Report Authenticated By: Tonia Ghent, M.D.   Dg Chest Port 1 View  09/08/2011  *RADIOLOGY REPORT*  Clinical Data: Congestive heart failure.  Severe shortness of breath.  Pulmonary edema.  PORTABLE CHEST - 1 VIEW  Comparison: 09/07/2011 and 12/07/2009  Findings: Bilateral pulmonary edema persists with slight clearing at the right lung base.  No  effusions.  Chronic cardiomegaly.  No osseous abnormality.  IMPRESSION: Slight improvement in pulmonary edema at the right base.  Original Report Authenticated By: Gwynn Burly, M.D.    Microbiology: Recent Results (from the past 240 hour(s))  BODY FLUID CULTURE     Status: Normal   Collection Time   09/21/11  3:22 AM      Component Value Range Status Comment   Specimen Description PERITONEAL CAVITY   Final    Special Requests Immunocompromised   Final    Gram Stain     Final    Value: NO WBC SEEN     NO ORGANISMS SEEN   Culture NO GROWTH 3 DAYS   Final    Report Status 09/24/2011 FINAL   Final      Labs: Basic Metabolic Panel:  Lab 09/25/11 1478 09/24/11 0850 09/23/11 0357 09/22/11 0550  NA 134* 136 136 134*  K 4.3 4.3 4.8 4.3  CL 89* 91* 87* 88*  CO2 24 23 26 23   GLUCOSE 117* 147* 152* 105*  BUN 115* 105* 99* 99*  CREATININE 20.16* 19.53* 19.21* 19.69*  CALCIUM 9.3 9.2 9.4 9.1  MG -- -- -- --  PHOS 7.2* 8.3* 8.0* 9.0*   Liver Function Tests:  Lab 09/25/11 0500 09/24/11 0850 09/23/11 0357 09/22/11 0550  AST -- -- -- --  ALT -- -- -- --  ALKPHOS -- -- -- --  BILITOT -- -- -- --  PROT -- -- -- --  ALBUMIN 3.4* 3.4* 3.4* 3.3*   No results found for this basename: LIPASE:5,AMYLASE:5 in the last 168 hours No results found for this basename: AMMONIA:5 in the last 168 hours CBC:  Lab 09/25/11 0540 09/22/11 0550  WBC 13.6* 11.1*  NEUTROABS -- --  HGB 9.3* 9.6*  HCT 28.8* 30.0*  MCV 90.9 90.4  PLT 257 307   Cardiac Enzymes: No results found for this basename: CKTOTAL:5,CKMB:5,CKMBINDEX:5,TROPONINI:5 in the last 168 hours BNP: BNP (last 3 results)  Basename 09/07/11 2000  PROBNP 24858.0*     Time coordinating discharge: >65 min  Signed:  Aivy Akter  [facility] 09/28/2011, 10:02  AM    

## 2011-10-08 NOTE — ED Provider Notes (Signed)
Medical screening examination/treatment/procedure(s) were performed by non-physician practitioner and as supervising physician I was immediately available for consultation/collaboration. Pt with CKD on PD with worsening SOB.  Pt is fluid overloaded on exam without signs of infectious etiology.  20lb weight gain despite regular PD but only one course today.  Pt has no abd tenderness but does have rales on exam.  Needs admission and transferred to [facility].  Gwyneth Sprout, MD 10/08/11 [phone]

## 2011-12-29 ENCOUNTER — Encounter: Payer: Self-pay | Admitting: *Deleted

## 2011-12-30 ENCOUNTER — Encounter: Payer: Self-pay | Admitting: Internal Medicine

## 2011-12-30 ENCOUNTER — Ambulatory Visit (INDEPENDENT_AMBULATORY_CARE_PROVIDER_SITE_OTHER): Payer: Medicare Other | Admitting: Internal Medicine

## 2011-12-30 VITALS — BP 124/70 | HR 69 | Temp 98.2°F | Ht 74.0 in | Wt 315.0 lb

## 2011-12-30 DIAGNOSIS — R0602 Shortness of breath: Secondary | ICD-10-CM

## 2011-12-30 DIAGNOSIS — J383 Other diseases of vocal cords: Secondary | ICD-10-CM

## 2011-12-30 DIAGNOSIS — R0609 Other forms of dyspnea: Secondary | ICD-10-CM

## 2011-12-30 DIAGNOSIS — R05 Cough: Secondary | ICD-10-CM

## 2011-12-30 MED ORDER — FAMOTIDINE 20 MG PO TABS: ORAL_TABLET | ORAL | Status: DC

## 2011-12-30 NOTE — Assessment & Plan Note (Addendum)
The most common causes of chronic cough in immunocompetent adults include the following: upper airway cough syndrome (UACS), previously referred to as postnasal drip syndrome (PNDS), which is caused by variety of rhinosinus conditions; (2) asthma; (3) GERD; (4) chronic bronchitis from cigarette smoking or other inhaled environmental irritants; (5) nonasthmatic eosinophilic bronchitis; and (6) bronchiectasis.   These conditions, singly or in combination, have accounted for up to 94% of the causes of chronic cough in prospective studies.   Other conditions have constituted no >6% of the causes in prospective studies These have included bronchogenic carcinoma, chronic interstitial pneumonia, sarcoidosis, left ventricular failure, ACEI-induced cough, and aspiration from a condition associated with pharyngeal dysfunction.   Classic Upper airway cough syndrome, so named because it's frequently impossible to sort out how much is  CR/sinusitis with freq throat clearing (which can be related to primary GERD)   vs  causing  secondary (" extra esophageal")  GERD from wide swings in gastric pressure that occur with throat clearing, often  promoting self use of mint and menthol lozenges that reduce the lower esophageal sphincter tone and exacerbate the problem further in a cyclical fashion.   These are the same pts (now being labeled as having "irritable larynx syndrome" by some cough centers) who not infrequently have a history of having failed to tolerate ace inhibitors,  dry powder inhalers or biphosphonates or report having atypical reflux symptoms that don't respond to standard doses of PPI , and are easily confused as having aecopd or asthma flares by even experienced allergists/ pulmonologists.   He needs rx for gerd, cough variant asthma and return for pfts The proper method of use, as well as anticipated side effects, of a metered-dose inhaler are discussed and demonstrated to the patient. Improved  effectiveness after extensive coaching during this visit to a level of approximately  90% so best choice to cover ? Cough variant asthma is qvar.

## 2011-12-30 NOTE — Patient Instructions (Signed)
Qvar 80 (rust)  2 puffs each am then rinse and gargle and brush your teeth.  Pepcid 20 mg one at bedtime  Only use your albuterol (proaire red inhaler) as a rescue medication to be used if you can't catch your breath by resting or doing a relaxed purse lip breathing pattern. The less you use it, the better it will work when you need it.   I will let your doctor you may need to change labetolol to alternative medication because this can cause asthma and bronchitis which can be difficult to control  GERD (REFLUX)  is an extremely common cause of respiratory symptoms like voice problems, many times with no significant heartburn at all.    It can be treated with medication, but also with lifestyle changes including avoidance of late meals, excessive alcohol, smoking cessation, and avoid fatty foods, chocolate, peppermint, colas, red wine, and acidic juices such as orange juice.  NO MINT OR MENTHOL PRODUCTS SO NO COUGH DROPS  USE SUGARLESS CANDY INSTEAD (jolley ranchers or Stover's)  NO OIL BASED VITAMINS - use powdered substitutes.    Please schedule a follow up office visit in 4 weeks, sooner if needed with pfts

## 2011-12-30 NOTE — Assessment & Plan Note (Signed)
Eval by Gary Rivera 09/22/11   I hear no stridor today but it's worth noting 1) anytime he gets asthmatic any upper airway symptoms may worsen because intrathoracic airflow obstruction requires deeper insp effort to compensate 2) he is at high risk gerd/lpr which can exac upper airway symptoms in obese pt on PD  For now therefore optimize asthma rx, minimize ics exposure (which can irritate throat as well) and rx gerd and return for pft's with ent f/u prn at that point by Dr Gary Rivera

## 2011-12-30 NOTE — Progress Notes (Signed)
  Subjective:    Patient ID: Gary Rivera, male    DOB: 1960-09-10 MRN: [id number]  HPI  43 yobm never smoker  with esrf on PD 2009 and needed prn inhaler 2011 but increased sob/"wheezing" and so started on maint rx qvar Feb 2013 (but not non-adherent) then June 2013 with intubation at [facility] due to vol overload on vent about 10 days with neg ent by Suszanne Conners p extubation referred by Dr Louis Meckel urologist in [facility] for eval of ? Stridor.  12/30/2011 1st pulmonary eval cc not limited by breathing, doing water aerobics ok, but occ wheezing and coughing better p restart qvar and saba, cough mostly dry and after supper worst. No obvious daytime variabilty or assoc  cp or chest tightness,   overt sinus or hb symptoms. No unusual exp hx or h/o childhood pna/ asthma or premature birth to his knowledge.   Sleeping ok without nocturnal  or early am exacerbation  of respiratory  c/o's or need for noct saba. Also denies any obvious fluctuation of symptoms with weather or environmental changes or other aggravating or alleviating factors except as outlined above    Review of Systems  Constitutional: Negative for fever, chills, diaphoresis, activity change, appetite change, fatigue and unexpected weight change.  HENT: Positive for congestion, dental problem, voice change and postnasal drip. Negative for hearing loss, ear pain, nosebleeds, sore throat, facial swelling, rhinorrhea, sneezing, mouth sores, trouble swallowing, neck pain, neck stiffness, sinus pressure, tinnitus and ear discharge.   Eyes: Negative for visual disturbance.  Respiratory: Positive for cough and wheezing. Negative for choking, chest tightness and shortness of breath.   Cardiovascular: Negative for chest pain, palpitations and leg swelling.  Gastrointestinal: Positive for nausea. Negative for vomiting, abdominal pain, constipation and blood in stool.  Genitourinary: Negative for difficulty urinating.  Musculoskeletal: Negative for myalgias, back  pain, joint swelling, arthralgias and gait problem.  Skin: Negative for color change, pallor and rash.  Neurological: Positive for light-headedness. Negative for dizziness, tremors, seizures, speech difficulty, weakness, numbness and headaches.  Hematological: Does not bruise/bleed easily.  Psychiatric/Behavioral: Negative for confusion, disturbed wake/sleep cycle and agitation. The patient is not nervous/anxious.        Objective:   Physical Exam  Obese amb bm nad Wt Readings from Last 3 Encounters:  12/30/11 315 lb (142.883 kg)  09/21/11 295 lb 6.7 oz (134 kg)     HEENT: nl dentition, turbinates, and orophanx. Nl external ear canals without cough reflex   NECK :  without JVD/Nodes/TM/ nl carotid upstrokes bilaterally   LUNGS: no acc muscle use, clear to A and P bilaterally without cough on insp or exp maneuvers   CV:  RRR  no s3 or murmur or increase in P2, no edema   ABD:  soft and nontender with nl excursion in the supine position. No bruits or organomegaly, bowel sounds nl  MS:  warm without deformities, calf tenderness, cyanosis or clubbing  SKIN: warm and dry without lesions    NEURO:  alert, approp, no deficits     09/22/11 cxr Portable exam as detailed above demonstrates cardiomegaly and  central pulmonary vascular prominence.  Limited evaluation lung bases.       Assessment & Plan:

## 2012-01-31 ENCOUNTER — Ambulatory Visit: Payer: Medicare Other | Admitting: Internal Medicine

## 2012-01-31 ENCOUNTER — Ambulatory Visit (INDEPENDENT_AMBULATORY_CARE_PROVIDER_SITE_OTHER): Payer: Medicare Other | Admitting: Internal Medicine

## 2012-01-31 ENCOUNTER — Ambulatory Visit: Payer: Medicare Other

## 2012-01-31 DIAGNOSIS — I272 Pulmonary hypertension, unspecified: Secondary | ICD-10-CM

## 2012-01-31 DIAGNOSIS — I2789 Other specified pulmonary heart diseases: Secondary | ICD-10-CM

## 2012-01-31 DIAGNOSIS — J811 Chronic pulmonary edema: Secondary | ICD-10-CM

## 2012-01-31 DIAGNOSIS — R05 Cough: Secondary | ICD-10-CM

## 2012-01-31 LAB — PULMONARY FUNCTION TEST

## 2012-01-31 NOTE — Progress Notes (Signed)
PFT done today. 

## 2012-02-01 ENCOUNTER — Ambulatory Visit (INDEPENDENT_AMBULATORY_CARE_PROVIDER_SITE_OTHER): Payer: Medicare Other | Admitting: Internal Medicine

## 2012-02-01 ENCOUNTER — Encounter: Payer: Self-pay | Admitting: Internal Medicine

## 2012-02-01 VITALS — BP 110/82 | HR 89 | Ht 74.0 in | Wt 317.0 lb

## 2012-02-01 DIAGNOSIS — I10 Essential (primary) hypertension: Secondary | ICD-10-CM

## 2012-02-01 DIAGNOSIS — J383 Other diseases of vocal cords: Secondary | ICD-10-CM

## 2012-02-01 DIAGNOSIS — R05 Cough: Secondary | ICD-10-CM

## 2012-02-01 MED ORDER — NEBIVOLOL HCL 5 MG PO TABS: 5.0000 mg | ORAL_TABLET | Freq: Every day | ORAL | Status: DC

## 2012-02-01 MED ORDER — OMEPRAZOLE 20 MG PO CPDR: 20.0000 mg | DELAYED_RELEASE_CAPSULE | Freq: Every day | ORAL | Status: DC

## 2012-02-01 NOTE — Progress Notes (Signed)
  Subjective:    Patient ID: Gary Rivera, male    DOB: Jun 06, 1960 MRN: [id number]  HPI  19 yobm never smoker  with esrf on PD 2009 and needed prn inhaler 2011 but increased sob/"wheezing" and so started on maint rx qvar Feb 2013 (but not non-adherent) then June 2013 with intubation at [facility] due to vol overload on vent about 10 days with neg ent by Suszanne Conners p extubation referred by Dr Louis Meckel urologist in [facility] for eval of ? Stridor.  12/30/2011 1st pulmonary eval cc not limited by breathing, doing water aerobics ok, but occ wheezing and coughing better p restart qvar and saba, cough mostly dry and after supper worst.  rec Qvar 80 (rust)  2 puffs each am then rinse and gargle and brush your teeth. Pepcid 20 mg one at bedtime Only use your albuterol (proaire red inhaler) as a rescue medication   I will let your doctor you may need to change labetolol to alternative medication because this can cause asthma and bronchitis which can be difficult to control GERD diet   02/01/2012 f/u ov/Canesha Tesfaye on qvar s need for saba cc only complaint is hoarseness which is overall improving but   has audible mild  insp stridor>  denies limiting doe and No obvious daytime variabilty or assoc chronic cough or cp or chest tightness, subjective wheeze overt sinus or hb symptoms. No unusual exp hx or h/o childhood pna/ asthma   Sleeping ok without nocturnal  or early am exacerbation  of respiratory  c/o's or need for noct saba. Also denies any obvious fluctuation of symptoms with weather or environmental changes or other aggravating or alleviating factors except as outlined above   ROS  The following are not active complaints unless bolded sore throat, dysphagia, dental problems, itching, sneezing,  nasal congestion or excess/ purulent secretions, ear ache,   fever, chills, sweats, unintended wt loss, pleuritic or exertional cp, hemoptysis,  orthopnea pnd or leg swelling, presyncope, palpitations, heartburn, abdominal pain,  anorexia, nausea, vomiting, diarrhea  or change in bowel or urinary habits, change in stools or urine, dysuria,hematuria,  rash, arthralgias, visual complaints, headache, numbness weakness or ataxia or problems with walking or coordination,  change in mood/affect or memory.            Objective:   Physical Exam  Obese amb bm nad very hoarse with prominent pseudowheeze Wt 317 02/01/2012  Wt Readings from Last 3 Encounters:  12/30/11 315 lb (142.883 kg)  09/21/11 295 lb 6.7 oz (134 kg)     HEENT: nl dentition, turbinates, and orophanx. Nl external ear canals without cough reflex   NECK :  without JVD/Nodes/TM/ nl carotid upstrokes bilaterally   LUNGS: no acc muscle use, clear to A and P bilaterally without cough on insp or exp maneuvers   CV:  RRR  no s3 or murmur or increase in P2, no edema   ABD:  soft and nontender with nl excursion in the supine position. No bruits or organomegaly, bowel sounds nl  MS:  warm without deformities, calf tenderness, cyanosis or clubbing  SKIN: warm and dry without lesions        09/22/11 cxr Portable exam as detailed above demonstrates cardiomegaly and  central pulmonary vascular prominence.  Limited evaluation lung bases.       Assessment & Plan:

## 2012-02-01 NOTE — Assessment & Plan Note (Signed)
DDX of  difficult airways managment all start with A and  include Adherence, Ace Inhibitors, Acid Reflux, Active Sinus Disease, Alpha 1 Antitripsin deficiency, Anxiety masquerading as Airways dz,  ABPA,  allergy(esp in young), Aspiration (esp in elderly), Adverse effects of DPI,  Active smokers, plus two Bs  = Bronchiectasis and Beta blocker use..and one C= CHF   Adherence is always the initial "prime suspect" and is a multilayered concern that requires a "trust but verify" approach in every patient - starting with knowing how to use medications, especially inhalers, correctly, keeping up with refills and understanding the fundamental difference between maintenance and prns vs those medications only taken for a very short course and then stopped and not refilled.   ? Acid reflux > rx  ? Beta blocker effect > see HBP for change to bystolic  ? Adverse effect of ics > try reduce qvar to 80 one bid and see if flares with taper, and / or if hoarsenss resolves

## 2012-02-01 NOTE — Assessment & Plan Note (Signed)
Eval by Suszanne Conners 09/22/11  PFT's 01/31/12 Pos moderate insp truncation > 02/01/2012 rec aggressive gerd Rx then back to [facility] if not improving symptomatically  See instructions for specific recommendations which were reviewed directly with the patient who was given a copy with highlighter outlining the key components.

## 2012-02-01 NOTE — Patient Instructions (Addendum)
I recommend bystolic 5 mg one daily  Stop labetolol  Decrease the qvar to just one each am  Try prilosec (omeprazole) 20mg   Take 30-60 min before first meal of the day and Pepcid 20 mg one bedtime until return  Please schedule a follow up office visit in 4 weeks, sooner if needed

## 2012-02-01 NOTE — Assessment & Plan Note (Signed)
Strongly prefer in this setting: Bystolic, the most beta -1  selective Beta blocker available in sample form, with bisoprolol the most selective generic choice  on the market.   Rec samples of bystolic 5 mg daily

## 2012-02-15 ENCOUNTER — Encounter: Payer: Self-pay | Admitting: Internal Medicine

## 2012-03-01 ENCOUNTER — Ambulatory Visit: Payer: Medicare Other | Admitting: Internal Medicine

## 2012-03-09 ENCOUNTER — Encounter: Payer: Self-pay | Admitting: Internal Medicine

## 2012-03-09 ENCOUNTER — Ambulatory Visit (INDEPENDENT_AMBULATORY_CARE_PROVIDER_SITE_OTHER): Payer: Medicare Other | Admitting: Internal Medicine

## 2012-03-09 VITALS — BP 126/84 | HR 78 | Temp 97.4°F | Ht 74.0 in | Wt 326.0 lb

## 2012-03-09 DIAGNOSIS — J383 Other diseases of vocal cords: Secondary | ICD-10-CM

## 2012-03-09 DIAGNOSIS — J9600 Acute respiratory failure, unspecified whether with hypoxia or hypercapnia: Secondary | ICD-10-CM

## 2012-03-09 DIAGNOSIS — J9601 Acute respiratory failure with hypoxia: Secondary | ICD-10-CM

## 2012-03-09 DIAGNOSIS — R05 Cough: Secondary | ICD-10-CM

## 2012-03-09 NOTE — Progress Notes (Signed)
Subjective:    Patient ID: [name], male    DOB: 1960/11/07  MRN: [id number]  HPI  25 yobm never smoker  with esrf on PD 2009 and needed prn inhaler 2011 but increased sob/"wheezing" and so started on maint rx qvar Feb 2013 (but not non-adherent) then June 2013 with intubation at [facility] due to vol overload on vent about 10 days with neg ent by Suszanne Conners p extubation referred by Dr Louis Meckel urologist in [facility] for eval of ? Stridor.  12/30/2011 1st pulmonary eval cc not limited by breathing, doing water aerobics ok, but occ wheezing and coughing better p restart qvar and saba, cough mostly dry and after supper worst.  rec Qvar 80 (rust)  2 puffs each am then rinse and gargle and brush your teeth. Pepcid 20 mg one at bedtime Only use your albuterol (proaire red inhaler) as a rescue medication   I will let your doctor you may need to change labetolol to alternative medication because this can cause asthma and bronchitis which can be difficult to control GERD diet   02/01/2012 f/u ov/Reyes Aldaco on qvar s need for saba cc only complaint is hoarseness which is overall improving but   has audible mild  insp stridor>  denies limiting doe   rec I recommend bystolic 5 mg one daily Stop labetolol Decrease the qvar to just one each am Try prilosec (omeprazole) 20mg   Take 30-60 min before first meal of the day and Pepcid 20 mg one bedtime until return   03/09/2012 f/u ov/Geraldine Tesar cc much better just using qvar once or twice weekly and no sense of need for daytime saba at all    No obvious daytime variabilty or assoc chronic cough or excess mucus or cp or chest tightness, subjective wheeze overt sinus or hb symptoms. No unusual exp hx or h/o childhood pna/ asthma   Sleeping ok without nocturnal  or early am exacerbation  of respiratory  c/o's or need for noct saba. Also denies any obvious fluctuation of symptoms with weather or environmental changes or other aggravating or alleviating factors except as outlined  above   ROS  The following are not active complaints unless bolded sore throat, dysphagia, dental problems, itching, sneezing,  nasal congestion or excess/ purulent secretions, ear ache,   fever, chills, sweats, unintended wt loss, pleuritic or exertional cp, hemoptysis,  orthopnea pnd or leg swelling, presyncope, palpitations, heartburn, abdominal pain, anorexia, nausea, vomiting, diarrhea  or change in bowel or urinary habits, change in stools or urine, dysuria,hematuria,  rash, arthralgias, visual complaints, headache, numbness weakness or ataxia or problems with walking or coordination,  change in mood/affect or memory.            Objective:   Physical Exam  Obese amb bm nad less hoarse with min pseudowheeze Wt 317 02/01/2012 > 03/09/2012  326  Wt Readings from Last 3 Encounters:  12/30/11 315 lb (142.883 kg)  09/21/11 295 lb 6.7 oz (134 kg)     HEENT: nl dentition, turbinates, and orophanx. Nl external ear canals without cough reflex   NECK :  without JVD/Nodes/TM/ nl carotid upstrokes bilaterally   LUNGS: no acc muscle use, clear to A and P bilaterally without cough on insp or exp maneuvers   CV:  RRR  no s3 or murmur or increase in P2, no edema   ABD:  Massive mod tense but  nontender with nl excursion in the supine position. No bruits or organomegaly, bowel sounds nl  MS:  warm without deformities, calf tenderness, cyanosis or clubbing  SKIN: warm and dry without lesions        09/22/11 cxr Portable exam as detailed above demonstrates cardiomegaly and  central pulmonary vascular prominence.  Limited evaluation lung bases.       Assessment & Plan:

## 2012-03-09 NOTE — Assessment & Plan Note (Signed)
His sats are borderline with decreased aeration on most recent cxr with body habitus c/w effects of obesity / PD on the ventilation of his lower lobes.  Could also have element of intravasc vol excess > reviewed

## 2012-03-09 NOTE — Assessment & Plan Note (Signed)
-   trial of qvar 80 2bid 12/30/2011 with hfa 90% p coaching  Better off qvar strongly against cough variant asthma.

## 2012-03-09 NOTE — Assessment & Plan Note (Addendum)
PFT's 01/31/12 Pos moderate insp truncation  This is improved with decrease ICS s flare of cough or wheeze or need for saba and with max gerd rx, reviewed.  However it is still present and if worsens will need re-eval by [facility] to r/o subglottic stenosis which looks and sounds a lot like vcd.  See instructions for specific recommendations which were reviewed directly with the patient who was given a copy with highlighter outlining the key components.  Will check his f/v loop again in 3 months and regroup then re longterm rx

## 2012-03-09 NOTE — Patient Instructions (Addendum)
Stay on prilosec before bast and pepcid at bedtime until and if breathing gets worse in meantime call for referral back to Dr Suszanne Conners, a throat specialist.  Stay off qvar  GERD (REFLUX)  is an extremely common cause of respiratory symptoms, many times with no significant heartburn at all.    It can be treated with medication, but also with lifestyle changes including avoidance of late meals, excessive alcohol, smoking cessation, and avoid fatty foods, chocolate, peppermint, colas, red wine, and acidic juices such as orange juice.  NO MINT OR MENTHOL PRODUCTS SO NO COUGH DROPS  USE SUGARLESS CANDY INSTEAD (jolley ranchers or Stover's)  NO OIL BASED VITAMINS - use powdered substitutes.    Please schedule a follow up visit in 3 months but call sooner if needed with pfts

## 2012-03-16 ENCOUNTER — Telehealth: Payer: Self-pay | Admitting: Internal Medicine

## 2012-03-16 MED ORDER — NEBIVOLOL HCL 5 MG PO TABS: 5.0000 mg | ORAL_TABLET | Freq: Every day | ORAL | Status: DC

## 2012-03-16 NOTE — Telephone Encounter (Signed)
MW changed pts BP med; gave 2 samples and pt aware to have PCP give refills.

## 2012-04-03 ENCOUNTER — Telehealth: Payer: Self-pay | Admitting: Internal Medicine

## 2012-04-03 NOTE — Telephone Encounter (Signed)
Bisoprolol 2.5 mg daily or give samples of bystolic until regroup at ov me or Tammy NP

## 2012-04-03 NOTE — Telephone Encounter (Signed)
I called [facility] to see what was needed. I was advised bystolic is on pt non formulary. No option was even giving to do a PA. Per the pharmacists no alternative was giving either. Please advise Dr. Sherene Sires thanks

## 2012-04-03 NOTE — Telephone Encounter (Signed)
I spoke with pt. He stated he is going to check with his kidney doctor tomorrow about the change and will call us back and let us know. Will await pt call back before signing off message

## 2012-04-04 MED ORDER — BISOPROLOL FUMARATE 5 MG PO TABS: 2.5000 mg | ORAL_TABLET | Freq: Every day | ORAL | Status: DC

## 2012-04-04 NOTE — Telephone Encounter (Signed)
Pt returned call of Mindy.  States he would like to get a Rx for.  Antionette Fairy

## 2012-04-04 NOTE — Telephone Encounter (Signed)
I spoke with pt. He would like RX sent to [facility]. I have done so. Pt aware of directions. Nothing further was needed.

## 2012-06-15 ENCOUNTER — Ambulatory Visit: Payer: Medicare Other | Admitting: Internal Medicine

## 2012-06-19 ENCOUNTER — Emergency Department (HOSPITAL_COMMUNITY): Payer: Medicare Other

## 2012-06-19 ENCOUNTER — Emergency Department (HOSPITAL_COMMUNITY)
Admission: EM | Admit: 2012-06-19 | Discharge: 2012-06-19 | Disposition: A | Discharge status: Home / Self Care | Payer: Medicare Other | Attending: Emergency Medicine | Admitting: Emergency Medicine

## 2012-06-19 ENCOUNTER — Encounter (HOSPITAL_COMMUNITY): Payer: Self-pay | Admitting: *Deleted

## 2012-06-19 DIAGNOSIS — M2550 Pain in unspecified joint: Secondary | ICD-10-CM | POA: Insufficient documentation

## 2012-06-19 DIAGNOSIS — I509 Heart failure, unspecified: Secondary | ICD-10-CM | POA: Insufficient documentation

## 2012-06-19 DIAGNOSIS — Z7982 Long term (current) use of aspirin: Secondary | ICD-10-CM | POA: Insufficient documentation

## 2012-06-19 DIAGNOSIS — Z9981 Dependence on supplemental oxygen: Secondary | ICD-10-CM | POA: Insufficient documentation

## 2012-06-19 DIAGNOSIS — I120 Hypertensive chronic kidney disease with stage 5 chronic kidney disease or end stage renal disease: Secondary | ICD-10-CM | POA: Insufficient documentation

## 2012-06-19 DIAGNOSIS — Z862 Personal history of diseases of the blood and blood-forming organs and certain disorders involving the immune mechanism: Secondary | ICD-10-CM | POA: Insufficient documentation

## 2012-06-19 DIAGNOSIS — F411 Generalized anxiety disorder: Secondary | ICD-10-CM | POA: Insufficient documentation

## 2012-06-19 DIAGNOSIS — Z79899 Other long term (current) drug therapy: Secondary | ICD-10-CM | POA: Insufficient documentation

## 2012-06-19 DIAGNOSIS — R5381 Other malaise: Secondary | ICD-10-CM | POA: Insufficient documentation

## 2012-06-19 DIAGNOSIS — IMO0001 Reserved for inherently not codable concepts without codable children: Secondary | ICD-10-CM | POA: Insufficient documentation

## 2012-06-19 DIAGNOSIS — Z992 Dependence on renal dialysis: Secondary | ICD-10-CM | POA: Insufficient documentation

## 2012-06-19 DIAGNOSIS — R5383 Other fatigue: Secondary | ICD-10-CM | POA: Insufficient documentation

## 2012-06-19 DIAGNOSIS — N186 End stage renal disease: Secondary | ICD-10-CM | POA: Insufficient documentation

## 2012-06-19 LAB — CBC
HCT: 28.4 % — ABNORMAL LOW (ref 39.0–52.0)
Hemoglobin: 9.4 g/dL — ABNORMAL LOW (ref 13.0–17.0)
MCHC: 33.1 g/dL (ref 30.0–36.0)
MCV: 92.8 fL (ref 78.0–100.0)
RDW: 16.7 % — ABNORMAL HIGH (ref 11.5–15.5)

## 2012-06-19 LAB — BASIC METABOLIC PANEL
BUN: 76 mg/dL — ABNORMAL HIGH (ref 6–23)
Creatinine, Ser: 20.31 mg/dL — ABNORMAL HIGH (ref 0.50–1.35)
GFR calc Af Amer: 3 mL/min — ABNORMAL LOW (ref >90)
GFR calc non Af Amer: 2 mL/min — ABNORMAL LOW (ref >90)
Glucose, Bld: 114 mg/dL — ABNORMAL HIGH (ref 70–99)
Potassium: 3 mEq/L — ABNORMAL LOW (ref 3.5–5.1)

## 2012-06-19 LAB — BODY FLUID CELL COUNT WITH DIFFERENTIAL: Total Nucleated Cell Count, Fluid: 8 cu mm (ref 0–1000)

## 2012-06-19 LAB — PROTIME-INR: INR: 1.05 (ref 0.00–1.49)

## 2012-06-19 LAB — LACTIC ACID, PLASMA: Lactic Acid, Venous: 1.2 mmol/L (ref 0.5–2.2)

## 2012-06-19 NOTE — ED Notes (Addendum)
Patient transported to X-ray 

## 2012-06-19 NOTE — ED Provider Notes (Signed)
History     CSN: [id number]  Arrival date & time 06/19/12  1710   First MD Initiated Contact with Patient 06/19/12 1822      Chief Complaint  Patient presents with  . Weakness    (Consider location/radiation/quality/duration/timing/severity/associated sxs/prior treatment) HPI Comments: Patient present with 2 day history of generalized weakness and feeling that his iron is low. He has a history of peritoneal dialysis at home and was recently hospitalized at [facility] for any infection in the skin. Denies any fevers. States he is still taking an antibiotic but does not know the name. Denies any chest pain, shortness of breath, abdominal pain, nausea vomiting. Good by mouth intake and urine output. Some lightheadedness with standing. Some productive cough. Denies any change to diasylate fluid. He wears O2 at home and has not had to increase it.  The history is provided by the patient.    Past Medical History  Diagnosis Date  . Hypertension   . Renal disorder   . CHF (congestive heart failure)   . Peritoneal dialysis catheter in place   . Anemia     patient reporats that last hgb 7.9 a week ago  . Anxiety state, unspecified     Past Surgical History  Procedure Laterality Date  . Appendectomy    . Peritoneal catheter insertion      Family History  Problem Relation Age of Onset  . Diabetes Mother   . Hypertension Mother   . Hypertension Sister   . Asthma Sister     History  Substance Use Topics  . Smoking status: Never Smoker   . Smokeless tobacco: Never Used  . Alcohol Use: No      Review of Systems  Constitutional: Positive for fatigue. Negative for fever, activity change and appetite change.  Respiratory: Negative for cough, chest tightness and shortness of breath.   Cardiovascular: Negative for chest pain.  Gastrointestinal: Negative for nausea, vomiting and abdominal pain.  Genitourinary: Negative for dysuria and hematuria.  Musculoskeletal: Positive for  myalgias and arthralgias. Negative for back pain.  Skin: Negative for rash.  Neurological: Positive for weakness. Negative for dizziness and headaches.  A complete 10 system review of systems was obtained and all systems are negative except as noted in the HPI and PMH.    Allergies  Renvela  Home Medications   Current Outpatient Rx  Name  Route  Sig  Dispense  Refill  . albuterol (PROVENTIL HFA;VENTOLIN HFA) 108 (90 BASE) MCG/ACT inhaler   Inhalation   Inhale 2 puffs into the lungs every 6 (six) hours as needed for wheezing.   1 Inhaler   2   . aspirin EC 81 MG tablet   Oral   Take 81 mg by mouth daily.         Marland Kitchen b complex-vitamin c-folic acid (NEPHRO-VITE) 0.8 MG TABS   Oral   Take 0.8 mg by mouth daily.         . calcitRIOL (ROCALTROL) 0.25 MCG capsule   Oral   Take 0.25 mcg by mouth See admin instructions. Takes 1 capsule daily Monday through friday         . clindamycin (CLEOCIN) 150 MG capsule   Oral   Take 150 mg by mouth 3 (three) times daily.         . clonazePAM (KLONOPIN) 0.5 MG tablet   Oral   Take 0.5 mg by mouth 2 (two) times daily as needed. anxiety         .  famotidine (PEPCID) 20 MG tablet   Oral   Take 20 mg by mouth at bedtime as needed for heartburn.         Marland Kitchen HYDROcodone-acetaminophen (VICODIN) 5-500 MG per tablet   Oral   Take 1 tablet by mouth every 6 (six) hours as needed. pain         . lanthanum (FOSRENOL) 1000 MG chewable tablet   Oral   Chew 1,000 mg by mouth 3 (three) times daily with meals.         . Multiple Vitamin (MULTIVITAMIN) tablet   Oral   Take 1 tablet by mouth daily.         . nebivolol (BYSTOLIC) 5 MG tablet   Oral   Take 5 mg by mouth daily.         Marland Kitchen omeprazole (PRILOSEC) 20 MG capsule   Oral   Take 20 mg by mouth daily.         Marland Kitchen tiotropium (SPIRIVA) 18 MCG inhalation capsule   Inhalation   Place 18 mcg into inhaler and inhale daily.         . vitamin B-12 (CYANOCOBALAMIN) 100 MCG  tablet   Oral   Take 50 mcg by mouth daily.           BP 87/60  Pulse 81  Temp(Src) 98.9 F (37.2 C)  Resp 25  SpO2 93%  Physical Exam  Constitutional: He is oriented to person, place, and time. He appears well-developed and well-nourished. No distress.  HENT:  Head: Normocephalic and atraumatic.  Mouth/Throat: Oropharynx is clear and moist. No oropharyngeal exudate.  Eyes: EOM are normal. Pupils are equal, round, and reactive to light.  Cardiovascular: Normal rate, regular rhythm and normal heart sounds.   No murmur heard. Pulmonary/Chest: Effort normal and breath sounds normal. No respiratory distress. He has no wheezes.  Abdominal: Soft. There is no tenderness. There is no rebound and no guarding.  PD catheter site clean  Musculoskeletal: Normal range of motion. He exhibits no edema and no tenderness.  Neurological: He is alert and oriented to person, place, and time. No cranial nerve deficit. He exhibits normal muscle tone. Coordination normal.  Skin: Skin is warm.    ED Course  Procedures (including critical care time)  Labs Reviewed  CBC - Abnormal; Notable for the following:    RBC 3.06 (*)    Hemoglobin 9.4 (*)    HCT 28.4 (*)    RDW 16.7 (*)    All other components within normal limits  BASIC METABOLIC PANEL - Abnormal; Notable for the following:    Potassium 3.0 (*)    Chloride 93 (*)    Glucose, Bld 114 (*)    BUN 76 (*)    Creatinine, Ser 20.31 (*)    Calcium 7.8 (*)    GFR calc non Af Amer 2 (*)    GFR calc Af Amer 3 (*)    All other components within normal limits  BODY FLUID CELL COUNT WITH DIFFERENTIAL - Abnormal; Notable for the following:    Color, Fluid STRAW (*)    All other components within normal limits  PRO B NATRIURETIC PEPTIDE - Abnormal; Notable for the following:    Pro B Natriuretic peptide (BNP) 3703.0 (*)    All other components within normal limits  BODY FLUID CULTURE  TROPONIN I  PROTIME-INR  LACTIC ACID, PLASMA   Dg Chest  2 View  06/19/2012  *RADIOLOGY REPORT*  Clinical Data: Cough, fever  CHEST - 2 VIEW  Comparison: 09/22/2011  Findings: Lungs are clear. No pleural effusion or pneumothorax.  Mild cardiomegaly.  Mild degenerative changes of the visualized thoracolumbar spine.  IMPRESSION: No evidence of acute cardiopulmonary disease.   Original Report Authenticated By: Charline Bills, M.D.      1. Fatigue       MDM  2 day history of generalized weakness. Patient states his blood pressures normally in the 90s to 100. Denies any fevers. He is on home oxygen and has not had to increase it. States compliance with his peritoneal dialysis.  Patient complains of fatigue and generalized weakness and is concerned that his hemoglobin is low. He denies any chest pain, shortness of breath, fevers or vomiting. No problems with his peritoneal dialysis.  Chest x-ray negative. Hypokalemia noted. Hemoglobin is stable to previous. Peritoneal fluid is negative for infection.  Orthostatics are negative. Patient tolerating by mouth ambulatory in the ED without desaturation. He states he has an appointment with his nephrologist in 2 days. Dr. Louis Meckel in [facility].       Glynn Octave, MD 06/20/12 [phone]

## 2012-06-19 NOTE — ED Notes (Signed)
Pt is here with weakness and thinks iron is low.  Pt is home dialysis.   Bp 103/50  Hr 90.  93%/2L

## 2012-06-23 LAB — BODY FLUID CULTURE: Gram Stain: NONE SEEN

## 2012-07-01 ENCOUNTER — Inpatient Hospital Stay (HOSPITAL_COMMUNITY)
Admission: EM | Admit: 2012-07-01 | Discharge: 2012-07-16 | DRG: 871 | Disposition: A | Discharge status: Home / Self Care | Payer: Medicare Other | Attending: Internal Medicine | Admitting: Internal Medicine

## 2012-07-01 ENCOUNTER — Emergency Department (HOSPITAL_COMMUNITY): Payer: Medicare Other

## 2012-07-01 ENCOUNTER — Encounter (HOSPITAL_COMMUNITY): Payer: Self-pay | Admitting: *Deleted

## 2012-07-01 DIAGNOSIS — J9620 Acute and chronic respiratory failure, unspecified whether with hypoxia or hypercapnia: Secondary | ICD-10-CM

## 2012-07-01 DIAGNOSIS — B379 Candidiasis, unspecified: Secondary | ICD-10-CM

## 2012-07-01 DIAGNOSIS — T398X5A Adverse effect of other nonopioid analgesics and antipyretics, not elsewhere classified, initial encounter: Secondary | ICD-10-CM | POA: Diagnosis present

## 2012-07-01 DIAGNOSIS — I2789 Other specified pulmonary heart diseases: Secondary | ICD-10-CM | POA: Diagnosis present

## 2012-07-01 DIAGNOSIS — D72829 Elevated white blood cell count, unspecified: Secondary | ICD-10-CM | POA: Diagnosis present

## 2012-07-01 DIAGNOSIS — R0689 Other abnormalities of breathing: Secondary | ICD-10-CM | POA: Diagnosis present

## 2012-07-01 DIAGNOSIS — K5900 Constipation, unspecified: Secondary | ICD-10-CM | POA: Diagnosis present

## 2012-07-01 DIAGNOSIS — R6520 Severe sepsis without septic shock: Secondary | ICD-10-CM | POA: Diagnosis present

## 2012-07-01 DIAGNOSIS — A419 Sepsis, unspecified organism: Principal | ICD-10-CM | POA: Diagnosis present

## 2012-07-01 DIAGNOSIS — N186 End stage renal disease: Secondary | ICD-10-CM | POA: Diagnosis present

## 2012-07-01 DIAGNOSIS — I5020 Unspecified systolic (congestive) heart failure: Secondary | ICD-10-CM

## 2012-07-01 DIAGNOSIS — J9690 Respiratory failure, unspecified, unspecified whether with hypoxia or hypercapnia: Secondary | ICD-10-CM

## 2012-07-01 DIAGNOSIS — Z992 Dependence on renal dialysis: Secondary | ICD-10-CM

## 2012-07-01 DIAGNOSIS — R109 Unspecified abdominal pain: Secondary | ICD-10-CM

## 2012-07-01 DIAGNOSIS — I272 Pulmonary hypertension, unspecified: Secondary | ICD-10-CM

## 2012-07-01 DIAGNOSIS — R6521 Severe sepsis with septic shock: Secondary | ICD-10-CM

## 2012-07-01 DIAGNOSIS — T8571XA Infection and inflammatory reaction due to peritoneal dialysis catheter, initial encounter: Secondary | ICD-10-CM

## 2012-07-01 DIAGNOSIS — E669 Obesity, unspecified: Secondary | ICD-10-CM

## 2012-07-01 DIAGNOSIS — J9602 Acute respiratory failure with hypercapnia: Secondary | ICD-10-CM | POA: Diagnosis present

## 2012-07-01 DIAGNOSIS — J811 Chronic pulmonary edema: Secondary | ICD-10-CM | POA: Diagnosis present

## 2012-07-01 DIAGNOSIS — G929 Unspecified toxic encephalopathy: Secondary | ICD-10-CM | POA: Diagnosis present

## 2012-07-01 DIAGNOSIS — D649 Anemia, unspecified: Secondary | ICD-10-CM

## 2012-07-01 DIAGNOSIS — R05 Cough: Secondary | ICD-10-CM

## 2012-07-01 DIAGNOSIS — R410 Disorientation, unspecified: Secondary | ICD-10-CM

## 2012-07-01 DIAGNOSIS — J9601 Acute respiratory failure with hypoxia: Secondary | ICD-10-CM

## 2012-07-01 DIAGNOSIS — D631 Anemia in chronic kidney disease: Secondary | ICD-10-CM | POA: Diagnosis present

## 2012-07-01 DIAGNOSIS — E662 Morbid (severe) obesity with alveolar hypoventilation: Secondary | ICD-10-CM | POA: Diagnosis present

## 2012-07-01 DIAGNOSIS — Z6841 Body Mass Index (BMI) 40.0 and over, adult: Secondary | ICD-10-CM

## 2012-07-01 DIAGNOSIS — I10 Essential (primary) hypertension: Secondary | ICD-10-CM | POA: Diagnosis present

## 2012-07-01 DIAGNOSIS — K659 Peritonitis, unspecified: Secondary | ICD-10-CM | POA: Diagnosis present

## 2012-07-01 DIAGNOSIS — G92 Toxic encephalopathy: Secondary | ICD-10-CM | POA: Diagnosis present

## 2012-07-01 DIAGNOSIS — I509 Heart failure, unspecified: Secondary | ICD-10-CM | POA: Diagnosis present

## 2012-07-01 DIAGNOSIS — I5022 Chronic systolic (congestive) heart failure: Secondary | ICD-10-CM | POA: Diagnosis present

## 2012-07-01 DIAGNOSIS — I953 Hypotension of hemodialysis: Secondary | ICD-10-CM

## 2012-07-01 DIAGNOSIS — R0902 Hypoxemia: Secondary | ICD-10-CM | POA: Diagnosis present

## 2012-07-01 DIAGNOSIS — K658 Other peritonitis: Secondary | ICD-10-CM | POA: Diagnosis present

## 2012-07-01 DIAGNOSIS — T8571XD Infection and inflammatory reaction due to peritoneal dialysis catheter, subsequent encounter: Secondary | ICD-10-CM

## 2012-07-01 LAB — POCT I-STAT 3, ART BLOOD GAS (G3+)
Acid-Base Excess: 1 mmol/L (ref 0.0–2.0)
Acid-Base Excess: 2 mmol/L (ref 0.0–2.0)
Bicarbonate: 32.2 mEq/L — ABNORMAL HIGH (ref 20.0–24.0)
Bicarbonate: 27.4 mEq/L — ABNORMAL HIGH (ref 20.0–24.0)
O2 Saturation: 97 %
O2 Saturation: 88 %
Patient temperature: 98.6
TCO2: 35 mmol/L (ref 0–100)
TCO2: 29 mmol/L (ref 0–100)
pCO2 arterial: 94.2 mmHg (ref 35.0–45.0)
pCO2 arterial: 46.3 mmHg — ABNORMAL HIGH (ref 35.0–45.0)
pH, Arterial: 7.142 — CL (ref 7.350–7.450)
pH, Arterial: 7.38 (ref 7.350–7.450)
pO2, Arterial: 126 mmHg — ABNORMAL HIGH (ref 80.0–100.0)
pO2, Arterial: 57 mmHg — ABNORMAL LOW (ref 80.0–100.0)

## 2012-07-01 LAB — COMPREHENSIVE METABOLIC PANEL
ALT: 33 U/L (ref 0–53)
AST: 30 U/L (ref 0–37)
Albumin: 2.6 g/dL — ABNORMAL LOW (ref 3.5–5.2)
Alkaline Phosphatase: 58 U/L (ref 39–117)
BUN: 63 mg/dL — ABNORMAL HIGH (ref 6–23)
CO2: 29 mEq/L (ref 19–32)
Calcium: 8.5 mg/dL (ref 8.4–10.5)
Chloride: 90 mEq/L — ABNORMAL LOW (ref 96–112)
Creatinine, Ser: 17.74 mg/dL — ABNORMAL HIGH (ref 0.50–1.35)
GFR calc Af Amer: 3 mL/min — ABNORMAL LOW (ref >90)
GFR calc non Af Amer: 3 mL/min — ABNORMAL LOW (ref >90)
Glucose, Bld: 105 mg/dL — ABNORMAL HIGH (ref 70–99)
Potassium: 3.1 mEq/L — ABNORMAL LOW (ref 3.5–5.1)
Sodium: 134 mEq/L — ABNORMAL LOW (ref 135–145)
Total Bilirubin: 0.3 mg/dL (ref 0.3–1.2)
Total Protein: 7.6 g/dL (ref 6.0–8.3)

## 2012-07-01 LAB — CBC WITH DIFFERENTIAL/PLATELET
Basophils Absolute: 0 10*3/uL (ref 0.0–0.1)
Basophils Relative: 0 % (ref 0–1)
Eosinophils Absolute: 0.2 10*3/uL (ref 0.0–0.7)
Eosinophils Relative: 3 % (ref 0–5)
HCT: 30.3 % — ABNORMAL LOW (ref 39.0–52.0)
Hemoglobin: 9.9 g/dL — ABNORMAL LOW (ref 13.0–17.0)
Lymphocytes Relative: 13 % (ref 12–46)
Lymphs Abs: 1.1 10*3/uL (ref 0.7–4.0)
MCH: 30.5 pg (ref 26.0–34.0)
MCHC: 32.7 g/dL (ref 30.0–36.0)
MCV: 93.2 fL (ref 78.0–100.0)
Monocytes Absolute: 0.6 10*3/uL (ref 0.1–1.0)
Monocytes Relative: 7 % (ref 3–12)
Neutro Abs: 6.3 10*3/uL (ref 1.7–7.7)
Neutrophils Relative %: 77 % (ref 43–77)
Platelets: 178 10*3/uL (ref 150–400)
RBC: 3.25 MIL/uL — ABNORMAL LOW (ref 4.22–5.81)
RDW: 15.6 % — ABNORMAL HIGH (ref 11.5–15.5)
WBC: 8.3 10*3/uL (ref 4.0–10.5)

## 2012-07-01 LAB — TROPONIN I: Troponin I: <0.3 ng/mL (ref <0.30)

## 2012-07-01 LAB — LIPASE, BLOOD: Lipase: 41 U/L (ref 11–59)

## 2012-07-01 MED ORDER — ALBUTEROL SULFATE HFA 108 (90 BASE) MCG/ACT IN AERS: 4.0000 | INHALATION_SPRAY | RESPIRATORY_TRACT | Status: DC | PRN

## 2012-07-01 MED ORDER — ROCURONIUM BROMIDE 50 MG/5ML IV SOLN
INTRAVENOUS | Status: AC
  Filled 2012-07-01: qty 2

## 2012-07-01 MED ORDER — NALOXONE HCL 0.4 MG/ML IJ SOLN
0.4000 mg | Freq: Once | INTRAMUSCULAR | Status: AC
  Administered 2012-07-01: 0.4 mg via INTRAVENOUS
  Filled 2012-07-01 (×2): qty 1

## 2012-07-01 MED ORDER — LORAZEPAM 2 MG/ML IJ SOLN
INTRAMUSCULAR | Status: AC
  Administered 2012-07-01: 2 mg
  Filled 2012-07-01: qty 1

## 2012-07-01 MED ORDER — LIDOCAINE HCL (CARDIAC) 20 MG/ML IV SOLN
INTRAVENOUS | Status: AC
  Filled 2012-07-01: qty 5

## 2012-07-01 MED ORDER — DEXTROSE 5 % IV SOLN
1.0000 g | Freq: Once | INTRAVENOUS | Status: AC
  Administered 2012-07-01: 1 g via INTRAVENOUS
  Filled 2012-07-01: qty 10

## 2012-07-01 MED ORDER — DEXTROSE 5 % IV SOLN
500.0000 mg | Freq: Once | INTRAVENOUS | Status: DC
  Administered 2012-07-02: 500 mg via INTRAVENOUS
  Filled 2012-07-01: qty 500

## 2012-07-01 MED ORDER — PROPOFOL 10 MG/ML IV EMUL
5.0000 ug/kg/min | INTRAVENOUS | Status: DC
  Administered 2012-07-02: 12 ug/kg/min via INTRAVENOUS
  Administered 2012-07-02: 20 ug/kg/min via INTRAVENOUS
  Administered 2012-07-02: 13.665 ug/kg/min via INTRAVENOUS
  Administered 2012-07-03: 25 ug/kg/min via INTRAVENOUS
  Administered 2012-07-03: 20 ug/kg/min via INTRAVENOUS
  Filled 2012-07-01 (×5): qty 100

## 2012-07-01 MED ORDER — FUROSEMIDE 10 MG/ML IJ SOLN
80.0000 mg | Freq: Once | INTRAMUSCULAR | Status: DC
  Filled 2012-07-01: qty 8

## 2012-07-01 MED ORDER — PANTOPRAZOLE SODIUM 40 MG IV SOLR
40.0000 mg | Freq: Every day | INTRAVENOUS | Status: DC
  Administered 2012-07-02 – 2012-07-05 (×4): 40 mg via INTRAVENOUS
  Filled 2012-07-01 (×6): qty 40

## 2012-07-01 MED ORDER — ONDANSETRON HCL 4 MG/2ML IJ SOLN
4.0000 mg | Freq: Once | INTRAMUSCULAR | Status: AC
  Administered 2012-07-01: 4 mg via INTRAVENOUS
  Filled 2012-07-01: qty 2

## 2012-07-01 MED ORDER — PROPOFOL 10 MG/ML IV EMUL
INTRAVENOUS | Status: AC
  Administered 2012-07-01: 22:00:00
  Filled 2012-07-01: qty 100

## 2012-07-01 MED ORDER — IOHEXOL 300 MG/ML  SOLN
50.0000 mL | Freq: Once | INTRAMUSCULAR | Status: AC | PRN
  Administered 2012-07-01: 50 mL via ORAL

## 2012-07-01 MED ORDER — DELFLEX-LC/1.5% DEXTROSE 346 MOSM/L IP SOLN: Freq: Once | INTRAPERITONEAL | Status: DC

## 2012-07-01 MED ORDER — IOHEXOL 300 MG/ML  SOLN
100.0000 mL | Freq: Once | INTRAMUSCULAR | Status: AC | PRN
  Administered 2012-07-01: 100 mL via INTRAVENOUS

## 2012-07-01 MED ORDER — ROCURONIUM BROMIDE 50 MG/5ML IV SOLN
INTRAVENOUS | Status: AC
  Administered 2012-07-01: 150 mg
  Filled 2012-07-01: qty 2

## 2012-07-01 MED ORDER — MORPHINE SULFATE 4 MG/ML IJ SOLN
8.0000 mg | Freq: Once | INTRAMUSCULAR | Status: AC
  Administered 2012-07-01: 8 mg via INTRAVENOUS
  Filled 2012-07-01 (×2): qty 1

## 2012-07-01 MED ORDER — FUROSEMIDE 10 MG/ML IJ SOLN
80.0000 mg | Freq: Once | INTRAMUSCULAR | Status: AC
  Administered 2012-07-01: 80 mg via INTRAVENOUS

## 2012-07-01 MED ORDER — SUCCINYLCHOLINE CHLORIDE 20 MG/ML IJ SOLN
INTRAMUSCULAR | Status: AC
  Filled 2012-07-01: qty 1

## 2012-07-01 MED ORDER — HEPARIN SODIUM (PORCINE) 1000 UNIT/ML IJ SOLN: 500.0000 [IU] | INTRAMUSCULAR | Status: DC | PRN

## 2012-07-01 MED ORDER — HEPARIN SODIUM (PORCINE) 5000 UNIT/ML IJ SOLN
5000.0000 [IU] | Freq: Three times a day (TID) | INTRAMUSCULAR | Status: DC
  Administered 2012-07-02: 5000 [IU] via SUBCUTANEOUS
  Filled 2012-07-01 (×5): qty 1

## 2012-07-01 MED ORDER — ETOMIDATE 2 MG/ML IV SOLN
INTRAVENOUS | Status: AC
  Administered 2012-07-01: 40 mg
  Filled 2012-07-01: qty 20

## 2012-07-01 MED ORDER — SODIUM CHLORIDE 0.9 % IV SOLN
250.0000 mL | INTRAVENOUS | Status: DC | PRN
  Administered 2012-07-08: 250 mL via INTRAVENOUS

## 2012-07-01 MED ORDER — ETOMIDATE 2 MG/ML IV SOLN
INTRAVENOUS | Status: AC
  Filled 2012-07-01: qty 20

## 2012-07-01 NOTE — Progress Notes (Signed)
Pt placed on bipap by RT.  Removed briefly after to intubate.

## 2012-07-01 NOTE — ED Provider Notes (Signed)
History    51yM with abdominal pain. Diffuse, but worse around umbilicus. Gradual onset about 3d ago and progressively worsening. Constant. Does not radiate. No fever or chills. ESRD. He reports going to nephrologist in [facility] [place]). He gets peritoneal dialysis 4 times a day. Reports recent infection of what sounds like abdominal wall cellulitis around catheter site. Resolved and currently off abx. Mild nausea. No vomiting. No fever or chills. Makes a small amount of urine.   CSN: [id number]  Arrival date & time 07/01/12  1604   First MD Initiated Contact with Patient 07/01/12 1613      Chief Complaint  Patient presents with  . Abdominal Pain    (Consider location/radiation/quality/duration/timing/severity/associated sxs/prior treatment) HPI  Past Medical History  Diagnosis Date  . Hypertension   . Renal disorder   . CHF (congestive heart failure)   . Peritoneal dialysis catheter in place   . Anemia     patient reporats that last hgb 7.9 a week ago  . Anxiety state, unspecified     Past Surgical History  Procedure Laterality Date  . Appendectomy    . Peritoneal catheter insertion      Family History  Problem Relation Age of Onset  . Diabetes Mother   . Hypertension Mother   . Hypertension Sister   . Asthma Sister     History  Substance Use Topics  . Smoking status: Never Smoker   . Smokeless tobacco: Never Used  . Alcohol Use: No      Review of Systems  All systems reviewed and negative, other than as noted in HPI.  Allergies  Renvela  Home Medications   Current Outpatient Rx  Name  Route  Sig  Dispense  Refill  . albuterol (PROVENTIL HFA;VENTOLIN HFA) 108 (90 BASE) MCG/ACT inhaler   Inhalation   Inhale 2 puffs into the lungs every 6 (six) hours as needed for wheezing.   1 Inhaler   2   . aspirin EC 81 MG tablet   Oral   Take 81 mg by mouth daily.         Marland Kitchen b complex-vitamin c-folic acid (NEPHRO-VITE) 0.8 MG TABS   Oral   Take 0.8  mg by mouth daily.         . bisoprolol (ZEBETA) 5 MG tablet   Oral   Take 2.5 mg by mouth daily.         . calcitRIOL (ROCALTROL) 0.25 MCG capsule   Oral   Take 0.25 mcg by mouth See admin instructions. Takes 1 capsule daily Monday through friday         . clonazePAM (KLONOPIN) 0.5 MG tablet   Oral   Take 0.5 mg by mouth 2 (two) times daily as needed. anxiety         . famotidine (PEPCID) 20 MG tablet   Oral   Take 20 mg by mouth at bedtime as needed for heartburn.         . lanthanum (FOSRENOL) 1000 MG chewable tablet   Oral   Chew 1,000 mg by mouth 3 (three) times daily with meals.         . Multiple Vitamin (MULTIVITAMIN) tablet   Oral   Take 1 tablet by mouth daily.         Marland Kitchen omeprazole (PRILOSEC) 20 MG capsule   Oral   Take 20 mg by mouth daily.         . vitamin B-12 (CYANOCOBALAMIN) 100 MCG tablet  Oral   Take 50 mcg by mouth daily.           BP 102/81  Pulse 97  Temp(Src) 99 F (37.2 C) (Oral)  Resp 19  SpO2 97%  Physical Exam  Nursing note and vitals reviewed. Constitutional: He appears well-developed and well-nourished. No distress.  Laying in bed. NAD.   HENT:  Head: Normocephalic and atraumatic.  Eyes: Conjunctivae are normal. Right eye exhibits no discharge. Left eye exhibits no discharge.  Neck: Neck supple.  Cardiovascular: Normal rate, regular rhythm and normal heart sounds.  Exam reveals no gallop and no friction rub.   No murmur heard. Pulmonary/Chest: Effort normal. No respiratory distress.  Diminished breath sounds b/l  Abdominal: Soft. He exhibits no distension and no mass. There is tenderness. There is no rebound and no guarding.  Mild diffuse tenderness w/o rebound/guarding. No distension. Dialysis cath. No concerning skin changes.   Musculoskeletal: He exhibits no edema and no tenderness.  Neurological: He is alert.  Skin: Skin is warm and dry.  Psychiatric: He has a normal mood and affect. His behavior is normal.  Thought content normal.    ED Course  Procedures (including critical care time)  CRITICAL CARE Performed by: Raeford Razor   Total critical care time: 30 minutes  Critical care time was exclusive of separately billable procedures and treating other patients.  Critical care was necessary to treat or prevent imminent or life-threatening deterioration.  Critical care was time spent personally by me on the following activities: development of treatment plan with patient and/or surrogate as well as nursing, discussions with consultants, evaluation of patient's response to treatment, examination of patient, obtaining history from patient or surrogate, ordering and performing treatments and interventions, ordering and review of laboratory studies, ordering and review of radiographic studies, pulse oximetry and re-evaluation of patient's condition.    Labs Reviewed  CBC WITH DIFFERENTIAL - Abnormal; Notable for the following:    RBC 3.25 (*)    Hemoglobin 9.9 (*)    HCT 30.3 (*)    RDW 15.6 (*)    All other components within normal limits  COMPREHENSIVE METABOLIC PANEL - Abnormal; Notable for the following:    Sodium 134 (*)    Potassium 3.1 (*)    Chloride 90 (*)    Glucose, Bld 105 (*)    BUN 63 (*)    Creatinine, Ser 17.74 (*)    Albumin 2.6 (*)    GFR calc non Af Amer 3 (*)    GFR calc Af Amer 3 (*)    All other components within normal limits  POCT I-STAT 3, BLOOD GAS (G3+) - Abnormal; Notable for the following:    pH, Arterial 7.141 (*)    pCO2 arterial 94.1 (*)    pO2, Arterial 36.0 (*)    Bicarbonate 32.1 (*)    All other components within normal limits  POCT I-STAT 3, BLOOD GAS (G3+) - Abnormal; Notable for the following:    pH, Arterial 7.142 (*)    pCO2 arterial 94.2 (*)    pO2, Arterial 126.0 (*)    Bicarbonate 32.2 (*)    All other components within normal limits  POCT I-STAT 3, BLOOD GAS (G3+) - Abnormal; Notable for the following:    pCO2 arterial 46.3 (*)     pO2, Arterial 57.0 (*)    Bicarbonate 27.4 (*)    All other components within normal limits  LIPASE, BLOOD  TROPONIN I  BLOOD GAS, ARTERIAL  BLOOD GAS, ARTERIAL  BLOOD GAS, ARTERIAL   Ct Abdomen Pelvis W Contrast  07/01/2012  *RADIOLOGY REPORT*  Clinical Data: Abdominal pain.  Peritoneal dialysis.  CT ABDOMEN AND PELVIS WITH CONTRAST  Technique:  Multidetector CT imaging of the abdomen and pelvis was performed following the standard protocol during bolus administration of intravenous contrast.  Contrast: OMNIPAQUE IOHEXOL 300 MG/ML  SOLN  Comparison: None.  Findings: There is cardiomegaly with slight bibasilar atelectasis, right more than left as well as pulmonary vascular prominence. No pleural effusions.  The liver, spleen, pancreas, adrenal glands are normal.  Bilateral renal atrophy with non-enhancing small lesions in both kidneys, consistent with benign cysts.  Biliary tree is normal.  There are no dilated loops of large or small bowel.  Terminal ileum is normal.  Prostate gland is not enlarged.  There is moderate fluid in the abdomen consistent with the patient's peritoneal dialysis.  Peritoneal dialysis catheter is present.  IMPRESSION: No acute abnormality of the abdomen or pelvis. Peritoneal dialysate in the abdomen.   Original Report Authenticated By: Francene Boyers, M.D.    Dg Chest Portable 1 View  07/01/2012  *RADIOLOGY REPORT*  Clinical Data: Endotracheal tube placement; abdominal pain.  PORTABLE CHEST - 1 VIEW  Comparison: Chest radiograph performed 06/19/2012  Findings: The patient's endotracheal tube is seen ending 2-3 cm above the carina.  The lungs are significantly hypoexpanded.  Patchy bibasilar airspace opacities may reflect atelectasis or possibly pneumonia. Vascular crowding and likely vascular congestion are seen.  A small left pleural effusion is suspected.  No pneumothorax is seen.  The cardiomediastinal silhouette is borderline normal in size.  No acute osseous  abnormalities are identified.  IMPRESSION:  1.  Endotracheal tube seen ending 2-3 cm above the carina. 2.  Lungs significantly hypoexpanded.  Patchy bibasilar airspace opacities may reflect atelectasis or possibly pneumonia.  Likely vascular congestion and small left pleural effusion.   Original Report Authenticated By: Tonia Ghent, M.D.      1. Respiratory failure   2. Abdominal pain   3. ESRD on dialysis       MDM  51yM with abdominal pain. Etiology not completely clear. No evidence of recurrence of abdominal wall cellulitis. CT a/p fairly unremarkable. Pt with increasingly somnolence and hypoxia in ED. Initially desaturations and snoring when sleep. Likely has sleep apnea. Increasingly more somnolent. Did receive dose of morphine for abdominal pain. Narcan given with no clinical effect. ABG with hypercarbia. Not much improvement in mental status with BiPAP. Intubated. Dicussed with CM. Some vascular congestion on XR. Lasix given because apparently dose make some urine. Abx for possible infectious etiology, although I feel less likely. Discussed with nephrology and CCM.        Raeford Razor, MD 07/04/12 [phone]

## 2012-07-01 NOTE — ED Notes (Signed)
Pt returned from c-t sats on nasal 02  atb 5.  sats in the low 80s non-rebreather mask placed.  resp therapy called for b-pap

## 2012-07-01 NOTE — Procedures (Signed)
Intubation Procedure Note Gary Rivera [id number] [birth year]  Procedure: Intubation Indications: Respiratory insufficiency  Procedure Details Consent: Risks of procedure as well as the alternatives and risks of each were explained to the (patient/caregiver).  Consent for procedure obtained. and Unable to obtain consent because of altered level of consciousness. Time Out: Verified patient identification, verified procedure, site/side was marked, verified correct patient position, special equipment/implants available, medications/allergies/relevent history reviewed, required imaging and test results available.  Performed  Maximum sterile technique was used including gloves and hand hygiene.  MAC and 3    Evaluation Hemodynamic Status: BP stable throughout; O2 sats: stable throughout Patient's Current Condition: stable Complications: No apparent complications Patient did tolerate procedure well. Chest X-ray ordered to verify placement.  CXR: pending.   Gary Rivera 07/01/2012

## 2012-07-01 NOTE — ED Notes (Signed)
To ct scan

## 2012-07-01 NOTE — ED Notes (Signed)
Pt not tolerating bi-pap sat staying low.  The pt may be a little more alert after the narcan. abgs were poor

## 2012-07-01 NOTE — ED Notes (Signed)
Narcan given will not scan  Given 30 minutes ago.  No sign change

## 2012-07-01 NOTE — ED Notes (Signed)
Pt moved to trauma a for intubation

## 2012-07-01 NOTE — ED Notes (Signed)
The pt is a peritoneal dialysis pt  That  Does not void.  He has been on dialysis fro 1-2 years

## 2012-07-01 NOTE — ED Notes (Signed)
To xray on nasal cannula at 5 per dr Juleen China.  He was switched to a non-rebreather but the edp wanted him to come over on nasal cannula

## 2012-07-01 NOTE — ED Notes (Signed)
The pt arrived by gems from home c/o abd pain for 3 days.  Pt is a dialysis pt and he has been weak for  2 weeks

## 2012-07-01 NOTE — Progress Notes (Signed)
Pt placed on bipap by rt, then shortly after was intubated.

## 2012-07-01 NOTE — H&P (Signed)
PULMONARY  / CRITICAL CARE MEDICINE  Name: Gary Rivera MRN: [id number] DOB: 04-29-1960    ADMISSION DATE:  07/01/2012 CONSULTATION DATE:  07/01/12  REFERRING MD :  Dr. Juleen China PRIMARY SERVICE: PCCM  CHIEF COMPLAINT:  Acute respiratory failure  BRIEF PATIENT DESCRIPTION: Gary Rivera 52 yo AA man pmh of pulmonary HTN, systolic HF, ESRD on peritoneal dialysis, and OHS presented with abdominal pain after 8mg  of morphine developed acute respiratory failure with little improvement from narcan and refused bipap. pt was intubated and PCCM called to manage.   SIGNIFICANT EVENTS / STUDIES:  4/13- intubated for acute on chronic respiratory failure  LINES / TUBES: 4/13 ETT>>> 4/13 foley>>> Peritoneal site  CULTURES: 4/13 periotneal fluid Cx>>> 4/13 sputum Cx>>> 4/13 BCx>>> 4/13 UCx>>>  ANTIBIOTICS: 4/13 rocephin >>>4/13 4/13 azithromax >>>4/13 4/13 IV Vanc >>> 4/13 IV Zosyn >>>  Imaging:  4/13 CT abd: slight bibasilar infiltrates, no dilated bowels or acute process 4/13 CXR: bibasilar infiltrates, L effusion, under inflated lungs  HISTORY OF PRESENT ILLNESS:  [name] presented for evaluation of abdominal pain but denied any SOB/CP and subsequent CT abd was negative for acute process. Unknown compliance with dialysis. Pt has had hx of numerous intubations in the past for respiratory failure. Pt was given 8mg  of morphine and then developed acute respiratory failure. Pt had little response to narcan and refused using BiPAP. Pt was subsequently intubated.   PAST MEDICAL HISTORY :  Past Medical History  Diagnosis Date  . Hypertension   . Renal disorder   . CHF (congestive heart failure)   . Peritoneal dialysis catheter in place   . Anemia     patient reporats that last hgb 7.9 a week ago  . Anxiety state, unspecified    Past Surgical History  Procedure Laterality Date  . Appendectomy    . Peritoneal catheter insertion     Prior to Admission medications   Medication Sig Start  Date End Date Taking? Authorizing Provider  albuterol (PROVENTIL HFA;VENTOLIN HFA) 108 (90 BASE) MCG/ACT inhaler Inhale 2 puffs into the lungs every 6 (six) hours as needed for wheezing. 09/25/11 09/24/12 Yes Calvert Cantor, MD  aspirin EC 81 MG tablet Take 81 mg by mouth daily.   Yes Historical Provider, MD  b complex-vitamin c-folic acid (NEPHRO-VITE) 0.8 MG TABS Take 0.8 mg by mouth daily.   Yes Historical Provider, MD  bisoprolol (ZEBETA) 5 MG tablet Take 2.5 mg by mouth daily.   Yes Historical Provider, MD  calcitRIOL (ROCALTROL) 0.25 MCG capsule Take 0.25 mcg by mouth See admin instructions. Takes 1 capsule daily Monday through friday   Yes Historical Provider, MD  clonazePAM (KLONOPIN) 0.5 MG tablet Take 0.5 mg by mouth 2 (two) times daily as needed. anxiety   Yes Historical Provider, MD  famotidine (PEPCID) 20 MG tablet Take 20 mg by mouth at bedtime as needed for heartburn.   Yes Historical Provider, MD  lanthanum (FOSRENOL) 1000 MG chewable tablet Chew 1,000 mg by mouth 3 (three) times daily with meals.   Yes Historical Provider, MD  Multiple Vitamin (MULTIVITAMIN) tablet Take 1 tablet by mouth daily.   Yes Historical Provider, MD  omeprazole (PRILOSEC) 20 MG capsule Take 20 mg by mouth daily. 02/01/12  Yes Nyoka Cowden, MD  vitamin B-12 (CYANOCOBALAMIN) 100 MCG tablet Take 50 mcg by mouth daily.   Yes Historical Provider, MD   Allergies  Allergen Reactions  . Renvela (Sevelamer) Nausea And Vomiting    FAMILY HISTORY:  Family History  Problem Relation Age of Onset  . Diabetes Mother   . Hypertension Mother   . Hypertension Sister   . Asthma Sister    SOCIAL HISTORY:  reports that he has never smoked. He has never used smokeless tobacco. He reports that he does not drink alcohol or use illicit drugs.  REVIEW OF SYSTEMS:  Level 5 caveat  SUBJECTIVE: pt intubated  VITAL SIGNS: Temp:  [99 F (37.2 C)] 99 F (37.2 C) (04/13 1610) Pulse Rate:  [96-97] 97 (04/13 2141) Resp:   [19-24] 19 (04/13 2141) BP: (102-107)/(65-81) 102/81 mmHg (04/13 2141) SpO2:  [93 %-97 %] 97 % (04/13 2141) FiO2 (%):  [40 %] 40 % (04/13 2141) HEMODYNAMICS:   VENTILATOR SETTINGS: Vent Mode:  [-] PRVC FiO2 (%):  [40 %] 40 % Set Rate:  [20 bmp] 20 bmp Vt Set:  [600 mL] 600 mL PEEP:  [5 cmH20] 5 cmH20 Plateau Pressure:  [24 cmH20] 24 cmH20 INTAKE / OUTPUT: Intake/Output   None     PHYSICAL EXAMINATION: General:  Sedated, RASS -3 Neuro:  sedated HEENT:  ETT, OG draining bilious material, no JVD Cardiovascular:  RRR, no murmurs/rubs/gallops Lungs:  CTAB, no crackles/wheezes Abdomen:  Distended, perioteneal site non-erythematous/non-indurated, morbidly obese Musculoskeletal:  intact Skin:  Scaly, dry, warm, no LE edema  LABS:  Recent Labs Lab 07/01/12 1627 07/01/12 2036 07/01/12 2045 07/01/12 2123 07/01/12 2235  HGB 9.9*  --   --   --   --   WBC 8.3  --   --   --   --   PLT 178  --   --   --   --   NA 134*  --   --   --   --   K 3.1*  --   --   --   --   CL 90*  --   --   --   --   CO2 29  --   --   --   --   GLUCOSE 105*  --   --   --   --   BUN 63*  --   --   --   --   CREATININE 17.74*  --   --   --   --   CALCIUM 8.5  --   --   --   --   AST 30  --   --   --   --   ALT 33  --   --   --   --   ALKPHOS 58  --   --   --   --   BILITOT 0.3  --   --   --   --   PROT 7.6  --   --   --   --   ALBUMIN 2.6*  --   --   --   --   TROPONINI  --   --   --  <0.30  --   PHART  --  7.141* 7.142*  --  7.380  PCO2ART  --  94.1* 94.2*  --  46.3*  PO2ART  --  36.0* 126.0*  --  57.0*   No results found for this basename: GLUCAP,  in the last 168 hours  ASSESSMENT / PLAN:  PULMONARY A: Acute on chronic hypercarbic respiratory failure and possible pulm edema and less likely HCAP. The patient received 8 mg of morphine in the ED.  P:   - Mechanical ventilation   - PRVC, Vt: 8cc/kg,  PEEP: 5, RR: 16, FiO2: 100% and adjust to keep O2 sat > 94% - VAP prevention order set -SBT  in AM -repeat ABG in one hr. -Abx see ID  CARDIOVASCULAR A: systolic heart failure EF 40% last Echo 6/13 P:  - cont bisoprolol and ASA  RENAL A:  ESRD dependent on peritoneal dialysis  P:   -renal consult and will dialysis today -bmet trend  GASTROINTESTINAL A:  Presented with abd pain. CT abd negative for acute process P:   -NPO -OG IS  HEMATOLOGIC A:  Hgb stable. No active issues P:  -Cbc in AM  INFECTIOUS A:  Possible HCAP, ? abd pain = SBT P:   -pancultures pending -IV Vanc/Zosyn -procalcitonin, lactic acid  ENDOCRINE A:  No active issues P:   -cont to monitor  NEUROLOGIC A:  sedated P:   -propofol   TODAY'S SUMMARY: -pt intubated for acute on chronic hypercarbic respiratory failure -SBT in AM -peritoneal dialysis today  Christen Bame, MD PGY-1 Pgr: [phone] 07/01/2012, 10:44 PM   I have personally obtained a history, examined the patient, evaluated laboratory and imaging results. I discussed with the resident and agree with the plan stated above.  The patient is critically ill with multiple organ systems failure and requires high complexity decision making for assessment and support, frequent evaluation and titration of therapies, application of advanced monitoring technologies and extensive interpretation of multiple databases.   Critical Care Time devoted to patient care services described in this note is: 45 Minutes.  Overton Mam, M.D. Pulmonary and Critical Care Medicine [facility] Pager: [phone]

## 2012-07-01 NOTE — ED Notes (Signed)
The pt has become more obtunded since the morphine earlier.  He snores and his sats drop.  He vomited just a few minutes ago and with exertion such as  Bed sheets changed and his sats dropped to the low 80s.  edp notified

## 2012-07-01 NOTE — ED Notes (Signed)
Pain med given he has contrast to drink. He sounds like he has sleep apnea he is due to be checked next month... He doses off easily

## 2012-07-01 NOTE — ED Notes (Signed)
The pt states that his pain is better and he wants food to eat.  Not able to eat he still needs to be scanned.

## 2012-07-01 NOTE — ED Notes (Signed)
The pt is slow drinking his contrast

## 2012-07-01 NOTE — Progress Notes (Signed)
Patient with snoring respirations placed on Bipap per md order.  Patient wore bipap for approx 5 min, then began pulling at mask and refused to wear.  Patient placed back on NRB with sats 94%.  MD and RN notified, orders received for ABG.

## 2012-07-01 NOTE — ED Provider Notes (Signed)
History     CSN: [id number]  Arrival date & time 07/01/12  1604   First MD Initiated Contact with Patient 07/01/12 1613      Chief Complaint  Patient presents with  . Abdominal Pain    (Consider location/radiation/quality/duration/timing/severity/associated sxs/prior treatment) HPI  Past Medical History  Diagnosis Date  . Hypertension   . Renal disorder   . CHF (congestive heart failure)   . Peritoneal dialysis catheter in place   . Anemia     patient reporats that last hgb 7.9 a week ago  . Anxiety state, unspecified     Past Surgical History  Procedure Laterality Date  . Appendectomy    . Peritoneal catheter insertion      Family History  Problem Relation Age of Onset  . Diabetes Mother   . Hypertension Mother   . Hypertension Sister   . Asthma Sister     History  Substance Use Topics  . Smoking status: Never Smoker   . Smokeless tobacco: Never Used  . Alcohol Use: No      Review of Systems  Allergies  Renvela  Home Medications   Current Outpatient Rx  Name  Route  Sig  Dispense  Refill  . albuterol (PROVENTIL HFA;VENTOLIN HFA) 108 (90 BASE) MCG/ACT inhaler   Inhalation   Inhale 2 puffs into the lungs every 6 (six) hours as needed for wheezing.   1 Inhaler   2   . aspirin EC 81 MG tablet   Oral   Take 81 mg by mouth daily.         Marland Kitchen b complex-vitamin c-folic acid (NEPHRO-VITE) 0.8 MG TABS   Oral   Take 0.8 mg by mouth daily.         . bisoprolol (ZEBETA) 5 MG tablet   Oral   Take 2.5 mg by mouth daily.         . calcitRIOL (ROCALTROL) 0.25 MCG capsule   Oral   Take 0.25 mcg by mouth See admin instructions. Takes 1 capsule daily Monday through friday         . clonazePAM (KLONOPIN) 0.5 MG tablet   Oral   Take 0.5 mg by mouth 2 (two) times daily as needed. anxiety         . famotidine (PEPCID) 20 MG tablet   Oral   Take 20 mg by mouth at bedtime as needed for heartburn.         . lanthanum (FOSRENOL) 1000 MG  chewable tablet   Oral   Chew 1,000 mg by mouth 3 (three) times daily with meals.         . Multiple Vitamin (MULTIVITAMIN) tablet   Oral   Take 1 tablet by mouth daily.         Marland Kitchen omeprazole (PRILOSEC) 20 MG capsule   Oral   Take 20 mg by mouth daily.         . vitamin B-12 (CYANOCOBALAMIN) 100 MCG tablet   Oral   Take 50 mcg by mouth daily.           BP 107/65  Pulse 96  Temp(Src) 99 F (37.2 C) (Oral)  Resp 24  SpO2 93%  Physical Exam  ED Course  INTUBATION Performed by: Oleh Genin Authorized by: Raeford Razor Consent: Verbal consent obtained. The procedure was performed in an emergent situation. Consent given by: patient Patient understanding: patient states understanding of the procedure being performed Patient consent: the patient's understanding of the procedure matches  consent given Procedure consent: procedure consent matches procedure scheduled Imaging studies: imaging studies available Required items: required blood products, implants, devices, and special equipment available Patient identity confirmed: arm band and verbally with patient Time out: Immediately prior to procedure a "time out" was called to verify the correct patient, procedure, equipment, support staff and site/side marked as required. Indications: respiratory distress, respiratory failure and hypoxemia Intubation method: video-assisted Patient status: paralyzed (RSI) Sedatives: etomidate Paralytic: rocuronium Laryngoscope size: Mac 4 Tube size: 8.0 mm Tube type: cuffed Number of attempts: 1 Cords visualized: yes Post-procedure assessment: chest rise,  ETCO2 monitor and CO2 detector Breath sounds: equal Cuff inflated: yes ETT to lip: 24 cm Tube secured with: ETT holder Chest x-ray findings: endotracheal tube in appropriate position Patient tolerance: Patient tolerated the procedure well with no immediate complications.   (including critical care time)  Labs Reviewed   CBC WITH DIFFERENTIAL - Abnormal; Notable for the following:    RBC 3.25 (*)    Hemoglobin 9.9 (*)    HCT 30.3 (*)    RDW 15.6 (*)    All other components within normal limits  COMPREHENSIVE METABOLIC PANEL - Abnormal; Notable for the following:    Sodium 134 (*)    Potassium 3.1 (*)    Chloride 90 (*)    Glucose, Bld 105 (*)    BUN 63 (*)    Creatinine, Ser 17.74 (*)    Albumin 2.6 (*)    GFR calc non Af Amer 3 (*)    GFR calc Af Amer 3 (*)    All other components within normal limits  POCT I-STAT 3, BLOOD GAS (G3+) - Abnormal; Notable for the following:    pH, Arterial 7.141 (*)    pCO2 arterial 94.1 (*)    pO2, Arterial 36.0 (*)    Bicarbonate 32.1 (*)    All other components within normal limits  POCT I-STAT 3, BLOOD GAS (G3+) - Abnormal; Notable for the following:    pH, Arterial 7.142 (*)    pCO2 arterial 94.2 (*)    pO2, Arterial 126.0 (*)    Bicarbonate 32.2 (*)    All other components within normal limits  LIPASE, BLOOD  BLOOD GAS, ARTERIAL  TROPONIN I   Ct Abdomen Pelvis W Contrast  07/01/2012  *RADIOLOGY REPORT*  Clinical Data: Abdominal pain.  Peritoneal dialysis.  CT ABDOMEN AND PELVIS WITH CONTRAST  Technique:  Multidetector CT imaging of the abdomen and pelvis was performed following the standard protocol during bolus administration of intravenous contrast.  Contrast: OMNIPAQUE IOHEXOL 300 MG/ML  SOLN  Comparison: None.  Findings: There is cardiomegaly with slight bibasilar atelectasis, right more than left as well as pulmonary vascular prominence. No pleural effusions.  The liver, spleen, pancreas, adrenal glands are normal.  Bilateral renal atrophy with non-enhancing small lesions in both kidneys, consistent with benign cysts.  Biliary tree is normal.  There are no dilated loops of large or small bowel.  Terminal ileum is normal.  Prostate gland is not enlarged.  There is moderate fluid in the abdomen consistent with the patient's peritoneal dialysis.   Peritoneal dialysis catheter is present.  IMPRESSION: No acute abnormality of the abdomen or pelvis. Peritoneal dialysate in the abdomen.   Original Report Authenticated By: Francene Boyers, M.D.    Dg Chest Portable 1 View  07/01/2012  *RADIOLOGY REPORT*  Clinical Data: Endotracheal tube placement; abdominal pain.  PORTABLE CHEST - 1 VIEW  Comparison: Chest radiograph performed 06/19/2012  Findings: The patient's  endotracheal tube is seen ending 2-3 cm above the carina.  The lungs are significantly hypoexpanded.  Patchy bibasilar airspace opacities may reflect atelectasis or possibly pneumonia. Vascular crowding and likely vascular congestion are seen.  A small left pleural effusion is suspected.  No pneumothorax is seen.  The cardiomediastinal silhouette is borderline normal in size.  No acute osseous abnormalities are identified.  IMPRESSION:  1.  Endotracheal tube seen ending 2-3 cm above the carina. 2.  Lungs significantly hypoexpanded.  Patchy bibasilar airspace opacities may reflect atelectasis or possibly pneumonia.  Likely vascular congestion and small left pleural effusion.   Original Report Authenticated By: Tonia Ghent, M.D.      1. Respiratory failure   2. Abdominal pain   3. ESRD on dialysis       MDM   This is just a procedure note for the intubation.  Please refer to the note of Dr. Juleen China for further details of the ED visit.  Oleh Genin, MD PGY-II [facility] Emergency Medicine Resident         Oleh Genin, MD 07/02/12 [phone]

## 2012-07-02 ENCOUNTER — Inpatient Hospital Stay (HOSPITAL_COMMUNITY): Payer: Medicare Other

## 2012-07-02 DIAGNOSIS — R6521 Severe sepsis with septic shock: Secondary | ICD-10-CM

## 2012-07-02 DIAGNOSIS — J9600 Acute respiratory failure, unspecified whether with hypoxia or hypercapnia: Secondary | ICD-10-CM

## 2012-07-02 DIAGNOSIS — N186 End stage renal disease: Secondary | ICD-10-CM

## 2012-07-02 DIAGNOSIS — A419 Sepsis, unspecified organism: Secondary | ICD-10-CM | POA: Diagnosis present

## 2012-07-02 LAB — CBC
HCT: 31.3 % — ABNORMAL LOW (ref 39.0–52.0)
Hemoglobin: 10.2 g/dL — ABNORMAL LOW (ref 13.0–17.0)
MCH: 30.5 pg (ref 26.0–34.0)
MCHC: 32.6 g/dL (ref 30.0–36.0)
MCV: 93.7 fL (ref 78.0–100.0)
Platelets: 219 10*3/uL (ref 150–400)
RBC: 3.34 MIL/uL — ABNORMAL LOW (ref 4.22–5.81)
RDW: 15.7 % — ABNORMAL HIGH (ref 11.5–15.5)
WBC: 11.5 10*3/uL — ABNORMAL HIGH (ref 4.0–10.5)

## 2012-07-02 LAB — POCT I-STAT 3, ART BLOOD GAS (G3+)
Acid-Base Excess: 1 mmol/L (ref 0.0–2.0)
O2 Saturation: 49 %
Patient temperature: 98.6
pO2, Arterial: 36 mmHg — CL (ref 80.0–100.0)

## 2012-07-02 LAB — PROCALCITONIN: Procalcitonin: 5.85 ng/mL

## 2012-07-02 LAB — BASIC METABOLIC PANEL
BUN: 65 mg/dL — ABNORMAL HIGH (ref 6–23)
CO2: 23 mEq/L (ref 19–32)
Calcium: 8.6 mg/dL (ref 8.4–10.5)
Chloride: 87 mEq/L — ABNORMAL LOW (ref 96–112)
Creatinine, Ser: 17.82 mg/dL — ABNORMAL HIGH (ref 0.50–1.35)
GFR calc Af Amer: 3 mL/min — ABNORMAL LOW (ref >90)
GFR calc non Af Amer: 3 mL/min — ABNORMAL LOW (ref >90)
Glucose, Bld: 111 mg/dL — ABNORMAL HIGH (ref 70–99)
Potassium: 3.4 mEq/L — ABNORMAL LOW (ref 3.5–5.1)
Sodium: 132 mEq/L — ABNORMAL LOW (ref 135–145)

## 2012-07-02 LAB — GLUCOSE, CAPILLARY: Glucose-Capillary: 112 mg/dL — ABNORMAL HIGH (ref 70–99)

## 2012-07-02 LAB — MRSA PCR SCREENING: MRSA by PCR: NEGATIVE

## 2012-07-02 LAB — MAGNESIUM: Magnesium: 1.9 mg/dL (ref 1.5–2.5)

## 2012-07-02 LAB — LACTIC ACID, PLASMA: Lactic Acid, Venous: 3.3 mmol/L — ABNORMAL HIGH (ref 0.5–2.2)

## 2012-07-02 MED ORDER — PANTOPRAZOLE SODIUM 40 MG PO TBEC: 40.0000 mg | DELAYED_RELEASE_TABLET | Freq: Every day | ORAL | Status: DC

## 2012-07-02 MED ORDER — PROPOFOL 10 MG/ML IV EMUL: 5.0000 ug/kg/min | INTRAVENOUS | Status: DC

## 2012-07-02 MED ORDER — SODIUM CHLORIDE 0.9 % IV SOLN: 250.0000 mL | INTRAVENOUS | Status: DC | PRN

## 2012-07-02 MED ORDER — PIPERACILLIN-TAZOBACTAM 3.375 G IVPB
3.3750 g | Freq: Four times a day (QID) | INTRAVENOUS | Status: DC
  Administered 2012-07-02 – 2012-07-04 (×7): 3.375 g via INTRAVENOUS
  Filled 2012-07-02 (×10): qty 50

## 2012-07-02 MED ORDER — BIOTENE DRY MOUTH MT LIQD
15.0000 mL | Freq: Four times a day (QID) | OROMUCOSAL | Status: DC
  Administered 2012-07-03 – 2012-07-09 (×22): 15 mL via OROMUCOSAL

## 2012-07-02 MED ORDER — CHLORHEXIDINE GLUCONATE 0.12 % MT SOLN
15.0000 mL | Freq: Two times a day (BID) | OROMUCOSAL | Status: DC
  Administered 2012-07-02 – 2012-07-03 (×2): 15 mL via OROMUCOSAL
  Filled 2012-07-02: qty 15

## 2012-07-02 MED ORDER — HEPARIN SODIUM (PORCINE) 1000 UNIT/ML IJ SOLN
500.0000 [IU] | INTRAMUSCULAR | Status: DC | PRN
  Administered 2012-07-02: 500 [IU] via INTRAPERITONEAL
  Filled 2012-07-02: qty 3
  Filled 2012-07-02 (×2): qty 0.5

## 2012-07-02 MED ORDER — PIPERACILLIN-TAZOBACTAM IN DEX 2-0.25 GM/50ML IV SOLN
2.2500 g | Freq: Three times a day (TID) | INTRAVENOUS | Status: DC
  Administered 2012-07-02 (×2): 2.25 g via INTRAVENOUS
  Filled 2012-07-02 (×4): qty 50

## 2012-07-02 MED ORDER — PRISMASOL BGK 4/2.5 32-4-2.5 MEQ/L IV SOLN
INTRAVENOUS | Status: DC
  Administered 2012-07-02 – 2012-07-03 (×7): via INTRAVENOUS_CENTRAL
  Filled 2012-07-02 (×16): qty 5000

## 2012-07-02 MED ORDER — CALCITRIOL 0.25 MCG PO CAPS
0.2500 ug | ORAL_CAPSULE | ORAL | Status: DC
  Administered 2012-07-04 – 2012-07-13 (×8): 0.25 ug via ORAL
  Filled 2012-07-02 (×10): qty 1

## 2012-07-02 MED ORDER — NOREPINEPHRINE BITARTRATE 1 MG/ML IJ SOLN
2.0000 ug/min | INTRAVENOUS | Status: DC
  Administered 2012-07-02: 5 ug/min via INTRAVENOUS
  Filled 2012-07-02: qty 16

## 2012-07-02 MED ORDER — BISOPROLOL FUMARATE 5 MG PO TABS
2.5000 mg | ORAL_TABLET | Freq: Every day | ORAL | Status: DC
  Filled 2012-07-02: qty 0.5

## 2012-07-02 MED ORDER — LANTHANUM CARBONATE 500 MG PO CHEW
1000.0000 mg | CHEWABLE_TABLET | Freq: Three times a day (TID) | ORAL | Status: DC
  Administered 2012-07-03 – 2012-07-16 (×25): 1000 mg via ORAL
  Filled 2012-07-02 (×47): qty 2

## 2012-07-02 MED ORDER — CHLORHEXIDINE GLUCONATE 0.12 % MT SOLN
15.0000 mL | Freq: Two times a day (BID) | OROMUCOSAL | Status: DC
  Administered 2012-07-02: 15 mL via OROMUCOSAL
  Filled 2012-07-02 (×2): qty 15

## 2012-07-02 MED ORDER — HEPARIN SODIUM (PORCINE) 5000 UNIT/ML IJ SOLN
5000.0000 [IU] | Freq: Three times a day (TID) | INTRAMUSCULAR | Status: DC
  Filled 2012-07-02: qty 1

## 2012-07-02 MED ORDER — VANCOMYCIN HCL 10 G IV SOLR
2500.0000 mg | Freq: Two times a day (BID) | INTRAVENOUS | Status: AC
  Administered 2012-07-02: 2500 mg via INTRAVENOUS
  Filled 2012-07-02 (×2): qty 2500

## 2012-07-02 MED ORDER — PRISMASOL BGK 4/2.5 32-4-2.5 MEQ/L IV SOLN
INTRAVENOUS | Status: DC
  Administered 2012-07-02 – 2012-07-03 (×3): via INTRAVENOUS_CENTRAL
  Filled 2012-07-02 (×3): qty 5000

## 2012-07-02 MED ORDER — PRISMASOL BGK 4/2.5 32-4-2.5 MEQ/L IV SOLN
INTRAVENOUS | Status: DC
  Administered 2012-07-02 – 2012-07-03 (×2): via INTRAVENOUS_CENTRAL
  Filled 2012-07-02 (×3): qty 5000

## 2012-07-02 MED ORDER — SODIUM CHLORIDE 0.9 % IV SOLN
250.0000 mL | INTRAVENOUS | Status: DC | PRN
  Administered 2012-07-02 – 2012-07-04 (×2): 250 mL via INTRAVENOUS

## 2012-07-02 MED ORDER — VANCOMYCIN HCL 10 G IV SOLR
1500.0000 mg | INTRAVENOUS | Status: DC
  Administered 2012-07-02: 1500 mg via INTRAVENOUS
  Filled 2012-07-02 (×2): qty 1500

## 2012-07-02 MED ORDER — SODIUM CHLORIDE 0.9 % FOR CRRT
INTRAVENOUS_CENTRAL | Status: DC | PRN
  Filled 2012-07-02: qty 1000

## 2012-07-02 MED ORDER — HEPARIN SODIUM (PORCINE) 1000 UNIT/ML DIALYSIS
1000.0000 [IU] | INTRAMUSCULAR | Status: DC | PRN
  Filled 2012-07-02: qty 3
  Filled 2012-07-02: qty 6

## 2012-07-02 MED ORDER — PIPERACILLIN-TAZOBACTAM IN DEX 2-0.25 GM/50ML IV SOLN
2.2500 g | Freq: Once | INTRAVENOUS | Status: AC
  Administered 2012-07-02: 2.25 g via INTRAVENOUS
  Filled 2012-07-02: qty 50

## 2012-07-02 MED ORDER — ASPIRIN 325 MG PO TABS
325.0000 mg | ORAL_TABLET | Freq: Every day | ORAL | Status: DC
  Administered 2012-07-02 – 2012-07-09 (×7): 325 mg
  Filled 2012-07-02 (×8): qty 1

## 2012-07-02 MED ORDER — SODIUM CHLORIDE 0.9 % IJ SOLN
3.0000 mL | Freq: Two times a day (BID) | INTRAMUSCULAR | Status: DC
  Administered 2012-07-02 – 2012-07-05 (×8): 3 mL via INTRAVENOUS
  Administered 2012-07-06: 13:00:00 via INTRAVENOUS
  Administered 2012-07-06 – 2012-07-10 (×5): 3 mL via INTRAVENOUS

## 2012-07-02 MED ORDER — ASPIRIN EC 81 MG PO TBEC
81.0000 mg | DELAYED_RELEASE_TABLET | Freq: Every day | ORAL | Status: DC
  Filled 2012-07-02: qty 1

## 2012-07-02 MED ORDER — SODIUM CHLORIDE 0.9 % IJ SOLN
3.0000 mL | INTRAMUSCULAR | Status: DC | PRN
  Administered 2012-07-07: 3 mL via INTRAVENOUS

## 2012-07-02 NOTE — Progress Notes (Signed)
Wake up assessment.  Pt is on continuous propofol at 12 mcg.  Pt awakens and follows directions and is able to write notes to communicate needs.

## 2012-07-02 NOTE — Progress Notes (Signed)
PULMONARY  / CRITICAL CARE MEDICINE  Name: Gary Rivera MRN: [id number] DOB: [birth year]    ADMISSION DATE:  07/01/2012 CONSULTATION DATE:  07/01/12  REFERRING MD :  Dr. Juleen China PRIMARY SERVICE: PCCM  CHIEF COMPLAINT:  Acute respiratory failure  BRIEF PATIENT DESCRIPTION: Gary Rivera 52 yo AA man pmh of pulmonary HTN, systolic HF, ESRD on peritoneal dialysis, and OHS presented with abdominal pain after 8mg  of morphine developed acute respiratory failure with little improvement from narcan and refused bipap. pt was intubated and PCCM called to manage.   SIGNIFICANT EVENTS / STUDIES:  4/13 Adm with dx sepsis, resp source suspected 4/13 CT abd: slight bibasilar infiltrates, no dilated bowels or acute process 4/13 CXR: bibasilar infiltrates, L effusion, under inflated lungs 4/14 Vasopressors and CRRT initiated  LINES / TUBES: ETT 4/13 >> L IJ CVL 4/14 >> R IJ HD cath >>   CULTURES: 4/13 peritoneal fluid Cx >> GS neg >>  4/13 sputum Cx >> 4/13 BCx>>> 4/13 UCx>>>  ANTIBIOTICS: 4/13 rocephin >>>4/13 4/13 azithromax >>>4/13 4/13 IV Vanc >>> 4/13 IV Zosyn >>>  CXR: NSC  SUBJECTIVE:  RASS -1. + F/C  VITAL SIGNS: Temp:  [97.6 F (36.4 C)-101.3 F (38.5 C)] 97.6 F (36.4 C) (04/14 2000) Pulse Rate:  [56-113] 72 (04/14 2200) Resp:  [11-36] 15 (04/14 2200) BP: (66-168)/(41-95) 138/82 mmHg (04/14 2200) SpO2:  [91 %-100 %] 93 % (04/14 2200) FiO2 (%):  [40 %] 40 % (04/14 2200) Weight:  [142.7 kg (314 lb 9.5 oz)] 142.7 kg (314 lb 9.5 oz) (04/14 0500) HEMODYNAMICS:   VENTILATOR SETTINGS: Vent Mode:  [-] PRVC FiO2 (%):  [40 %] 40 % Set Rate:  [14 bmp-20 bmp] 14 bmp Vt Set:  [600 mL] 600 mL PEEP:  [5 cmH20] 5 cmH20 Plateau Pressure:  [21 cmH20-24 cmH20] 21 cmH20 INTAKE / OUTPUT: Intake/Output     04/14 0701 - 04/15 0700   I.V. (mL/kg) 608.9 (4.3)   NG/GT 60   IV Piggyback 600   Total Intake(mL/kg) 1268.9 (8.9)   Other 771   Total Output 771   Net +497.9          PHYSICAL EXAMINATION: General:  Sedated, RASS -1 Neuro:  sedated HEENT:  ETT, OG draining bilious material, no JVD Cardiovascular:  RRR, no murmurs/rubs/gallops Lungs:  CTAB, no crackles/wheezes Abdomen:  Distended, peritoneal site non-erythematous/non-indurated, morbidly obese Musculoskeletal:  intact Skin:  Scaly, dry, warm, no LE edema  LABS:  Recent Labs Lab 07/01/12 1627 07/01/12 2036 07/01/12 2045 07/01/12 2123 07/01/12 2235 07/02/12 0100 07/02/12 0200 07/02/12 0330  HGB 9.9*  --   --   --   --   --  10.2*  --   WBC 8.3  --   --   --   --   --  11.5*  --   PLT 178  --   --   --   --   --  219  --   NA 134*  --   --   --   --   --  132*  --   K 3.1*  --   --   --   --   --  3.4*  --   CL 90*  --   --   --   --   --  87*  --   CO2 29  --   --   --   --   --  23  --   GLUCOSE 105*  --   --   --   --   --  111*  --   BUN 63*  --   --   --   --   --  65*  --   CREATININE 17.74*  --   --   --   --   --  17.82*  --   CALCIUM 8.5  --   --   --   --   --  8.6  --   MG  --   --   --   --   --   --  1.9  --   AST 30  --   --   --   --   --   --   --   ALT 33  --   --   --   --   --   --   --   ALKPHOS 58  --   --   --   --   --   --   --   BILITOT 0.3  --   --   --   --   --   --   --   PROT 7.6  --   --   --   --   --   --   --   ALBUMIN 2.6*  --   --   --   --   --   --   --   LATICACIDVEN  --   --   --   --   --   --   --  3.3*  TROPONINI  --   --   --  <0.30  --   --   --   --   PROCALCITON  --   --   --   --   --  5.85  --   --   PHART  --  7.141* 7.142*  --  7.380  --   --   --   PCO2ART  --  94.1* 94.2*  --  46.3*  --   --   --   PO2ART  --  36.0* 126.0*  --  57.0*  --   --   --     Recent Labs Lab 07/02/12 0722 07/02/12 1146  GLUCAP 112* 89    ASSESSMENT / PLAN:  PULMONARY A: Acute on chronic hypercarbic respiratory failure  Suspect component of possible pulm edema Probable PNA P:   - Cont full vent support - Cont vent bundle  - Daily  WUA/SBT  CARDIOVASCULAR A: systolic heart failure EF 40% last Echo 6/13 Shock - likely septic P:  - Cont NE to maintain MAP > 65 mmHg  RENAL A:  ESRD, chronic peritoneal dialysis  P:   -CRRT per Renal   GASTROINTESTINAL A:  Presented with abd pain P:   -Consider nutrition 4/15   HEMATOLOGIC A: No active issues P:  -Monitor CBC intermittently   INFECTIOUS A:  Probable PNA Severe sepsis P:   -abx and micro as above -PCT protocol   ENDOCRINE A:  No active issues P:   -cont to monitor -Initiate SSI for glu > 180    NEUROLOGIC A:  sedated P:   -Cont propofol while intubated -Daily WUA  TODAY'S SUMMARY:    40 mins CCM time   Billy Fischer, MD ; [facility] service Mobile [phone].  After 5:30 PM or weekends, call [phone]

## 2012-07-02 NOTE — Procedures (Signed)
Hemodialysis Catheter Insertion Procedure Note Gary Rivera [id number] 18-Jul-1960  Procedure: Insertion of Central Venous Catheter Indications: Hemodialysis  Procedure Details Consent: Risks of procedure as well as the alternatives and risks of each were explained to the (patient/caregiver).  Consent for procedure obtained. Time Out: Verified patient identification, verified procedure, site/side was marked, verified correct patient position, special equipment/implants available, medications/allergies/relevent history reviewed, required imaging and test results available.  Performed  Maximum sterile technique was used including antiseptics, cap, gloves, gown, hand hygiene, mask and sheet. Skin prep: Chlorhexidine; local anesthetic administered A  triple lumen HD catheter was placed in the right internal jugular vein using the Seldinger technique to 15 cm.  Evaluation Blood flow good Complications: No apparent complications Patient did tolerate procedure well. Chest X-ray ordered to verify placement.  CXR: pending.   Procedure performed under direct supervision of Dr. Sung Amabile and with ultrasound guidance for real time vessel cannulation.      [name], NP-C [facility] Pgr: [phone] or [phone]   07/02/2012, 2:01 PM  I was present for and supervised the entire procedure  Billy Fischer, MD ; [facility] service Mobile [phone].  After 5:30 PM or weekends, call [phone]

## 2012-07-02 NOTE — Care Management Note (Addendum)
    Page 1 of 2   07/11/2012     3:18:09 PM   CARE MANAGEMENT NOTE 07/11/2012  Patient:  Gary Rivera, Gary Rivera   Account Number:  [id number]  Date Initiated:  07/02/2012  Documentation initiated by:  Junius Creamer  Subjective/Objective Assessment:   adm w resp failure, vent     Action/Plan:   lives  alone, pcp dr Marcelline Deist hassan   Anticipated DC Date:  07/16/2012   Anticipated DC Plan:  LONG TERM ACUTE CARE (LTAC)      DC Planning Services  CM consult      Choice offered to / List presented to:             Status of service:   Medicare Important Message given?   (If response is "NO", the following Medicare IM given date fields will be blank) Date Medicare IM given:   Date Additional Medicare IM given:    Discharge Disposition:    Per UR Regulation:  Reviewed for med. necessity/level of care/duration of stay  If discussed at Long Length of Stay Meetings, dates discussed:    Comments:  sister [name] [phone]  07-11-12 3pm Avie Arenas, RNBSN [phone] Sister in room.  Patient has questions about sleep studies. States has had sleep studies scheduled but he keeps cancelling because he is her.  Has one scheduled for tonight so would like to do it while he is here.  Told him we do not do that he will need to reschedule as OP once he gets out.  Informed I would cancel his appt tonight. Called [facility] sleep studies where he states he had it scheduled - does not have one tonight and has never been scheduled. Does not have Cpap at home - but feels he needs.  Informed physician that if they want him to have on discharge we can get set up but he will have to have an appt and go within 30 days or he will be billed for cpap not insurance. patient was informed of this also.   States does not have a car, uses [facility].  Does not want dialysis with [facility] - wants [place] at [facility] - [phone].  Doesn't want rehab - states already doing cardiac rehab at [facility].   patient lives in  [place], states PCP is [facility] in [place].  Talked with dialysis unit here as patient will need to be clipped and they will follow up.  Informed he needs to have PPD done and read here if doesn't already have one.  No record her of being given, patient states he has had in [place] dialysis - [phone].  Darel Hong in dialysis states will f/u and get back with [place].  07-09-12 12:15pm Avie Arenas, RNBSN [phone] Post surgery and extubated.  PT reordered.  Talked with patient about rehab in ltach setting due to need for continued IV antibiotics.  Does not feel at this time that he needs a higher level.  Has assistance at home with [facility], but is home alone when they are not there. Checking to see if would be a candidate for Lt[facility].  Patient in agreement to see what PT would recomment now.  Patient in agreement.  4/14 1030a debbie dowell rn,bsn

## 2012-07-02 NOTE — Consult Note (Signed)
Referring Provider: No ref. provider found Primary Care Physician:  [name], MD Primary Nephrologist:  Dr. Hyman Hopes  Reason for Consultation:  Management of End stage renal disease  HPI: Mr. Gary Rivera 52 yo AA man pmh of pulmonary HTN, systolic HF, ESRD on peritoneal dialysis, and OHS presented with abdominal pain. After receiving morphine in the ED apparently developed acute respiratory decompensation. Fever of 101.5 , fibrin noted in fluid, not particularly cloudy. Patient does not receive dialysis with [facility] and will need to contact center.    Past Medical History  Diagnosis Date  . Hypertension   . Renal disorder   . CHF (congestive heart failure)   . Peritoneal dialysis catheter in place   . Anemia     patient reporats that last hgb 7.9 a week ago  . Anxiety state, unspecified     Past Surgical History  Procedure Laterality Date  . Appendectomy    . Peritoneal catheter insertion      Prior to Admission medications   Medication Sig Start Date End Date Taking? Authorizing Provider  albuterol (PROVENTIL HFA;VENTOLIN HFA) 108 (90 BASE) MCG/ACT inhaler Inhale 2 puffs into the lungs every 6 (six) hours as needed for wheezing. 09/25/11 09/24/12 Yes Calvert Cantor, MD  aspirin EC 81 MG tablet Take 81 mg by mouth daily.   Yes Historical Provider, MD  b complex-vitamin c-folic acid (NEPHRO-VITE) 0.8 MG TABS Take 0.8 mg by mouth daily.   Yes Historical Provider, MD  bisoprolol (ZEBETA) 5 MG tablet Take 2.5 mg by mouth daily.   Yes Historical Provider, MD  calcitRIOL (ROCALTROL) 0.25 MCG capsule Take 0.25 mcg by mouth See admin instructions. Takes 1 capsule daily Monday through friday   Yes Historical Provider, MD  clonazePAM (KLONOPIN) 0.5 MG tablet Take 0.5 mg by mouth 2 (two) times daily as needed. anxiety   Yes Historical Provider, MD  famotidine (PEPCID) 20 MG tablet Take 20 mg by mouth at bedtime as needed for heartburn.   Yes Historical Provider, MD  lanthanum  (FOSRENOL) 1000 MG chewable tablet Chew 1,000 mg by mouth 3 (three) times daily with meals.   Yes Historical Provider, MD  Multiple Vitamin (MULTIVITAMIN) tablet Take 1 tablet by mouth daily.   Yes Historical Provider, MD  omeprazole (PRILOSEC) 20 MG capsule Take 20 mg by mouth daily. 02/01/12  Yes Nyoka Cowden, MD  vitamin B-12 (CYANOCOBALAMIN) 100 MCG tablet Take 50 mcg by mouth daily.   Yes Historical Provider, MD    Current Facility-Administered Medications  Medication Dose Route Frequency Provider Last Rate Last Dose  . 0.9 %  sodium chloride infusion  250 mL Intravenous PRN Elyse Jarvis, MD      . 0.9 %  sodium chloride infusion  250 mL Intravenous PRN Elyse Jarvis, MD      . 0.9 %  sodium chloride infusion  250 mL Intravenous PRN Curlene Dolphin, MD      . albuterol (PROVENTIL HFA;VENTOLIN HFA) 108 (90 BASE) MCG/ACT inhaler 4 puff  4 puff Inhalation Q2H PRN Curlene Dolphin, MD      . aspirin EC tablet 81 mg  81 mg Oral Daily Elyse Jarvis, MD      . bisoprolol (ZEBETA) tablet 2.5 mg  2.5 mg Oral Daily Elyse Jarvis, MD      . calcitRIOL (ROCALTROL) capsule 0.25 mcg  0.25 mcg Oral Custom Elyse Jarvis, MD      . heparin injection 1,000-6,000 Units  1,000-6,000 Units CRRT PRN Garnetta Buddy,  MD      . heparin injection 5,000 Units  5,000 Units Subcutaneous Q8H Curlene Dolphin, MD   5,000 Units at 07/02/12 0205  . heparin injection 500 Units  500 Units Intraperitoneal PRN Lonia Farber, MD   500 Units at 07/02/12 0543  . lanthanum (FOSRENOL) chewable tablet 1,000 mg  1,000 mg Oral TID WC Elyse Jarvis, MD      . norepinephrine (LEVOPHED) 16 mg in dextrose 5 % 250 mL infusion  2-50 mcg/min Intravenous Titrated Merwyn Katos, MD      . pantoprazole (PROTONIX) EC tablet 40 mg  40 mg Oral Daily Elyse Jarvis, MD      . pantoprazole (PROTONIX) injection 40 mg  40 mg Intravenous QHS Curlene Dolphin, MD   40 mg at 07/02/12 0207  . piperacillin-tazobactam  (ZOSYN) IVPB 2.25 g  2.25 g Intravenous Q8H Veronda Pauline Bowdle, RPH      . prismasol BGK 4/2.5 5,000 mL dialysis replacement fluid   CRRT Continuous Garnetta Buddy, MD      . prismasol BGK 4/2.5 5,000 mL dialysis replacement fluid   CRRT Continuous Garnetta Buddy, MD      . prismasol BGK 4/2.5 5,000 mL dialysis solution   CRRT Continuous Garnetta Buddy, MD      . propofol (DIPRIVAN) 10 mg/ml infusion  5-50 mcg/kg/min Intravenous Titrated Curlene Dolphin, MD 10.7 mL/hr at 07/02/12 0643 12.497 mcg/kg/min at 07/02/12 0643  . sodium chloride 0.9 % injection 3 mL  3 mL Intravenous Q12H Elyse Jarvis, MD      . sodium chloride 0.9 % injection 3 mL  3 mL Intravenous PRN Elyse Jarvis, MD      . sodium chloride 0.9 % primer fluid for CRRT   CRRT PRN Garnetta Buddy, MD        Allergies as of 07/01/2012 - Review Complete 07/01/2012  Allergen Reaction Noted  . Renvela (sevelamer) Nausea And Vomiting 06/19/2012    Family History  Problem Relation Age of Onset  . Diabetes Mother   . Hypertension Mother   . Hypertension Sister   . Asthma Sister     History   Social History  . Marital Status: Single    Spouse Name: N/A    Number of Children: 0  . Years of Education: N/A   Occupational History  . disabled    Social History Main Topics  . Smoking status: Never Smoker   . Smokeless tobacco: Never Used  . Alcohol Use: No  . Drug Use: No  . Sexually Active: Not on file   Other Topics Concern  . Not on file   Social History Narrative  . No narrative on file    Review of Systems:  Unable to obtain  Physical Exam: Vital signs in last 24 hours: Temp:  [99 F (37.2 C)-101.3 F (38.5 C)] 100.3 F (37.9 C) (04/14 0720) Pulse Rate:  [88-117] 88 (04/14 0800) Resp:  [17-36] 20 (04/14 0800) BP: (66-181)/(41-110) 66/41 mmHg (04/14 0800) SpO2:  [92 %-99 %] 94 % (04/14 0800) FiO2 (%):  [40 %] 40 % (04/14 0800) Weight:  [142.7 kg (314 lb 9.5 oz)] 142.7 kg (314 lb 9.5 oz) (04/14 0500)    General:   Chronically ill Head:  Normocephalic and atraumatic. Eyes:  Sclera clear, no icterus.   Conjunctiva pink. Ears:  Normal auditory acuity. Nose:  No deformity, discharge,  or lesions. Mouth:  Intubated Neck:  Supple; no masses or thyromegaly. JVP not  elevated Lungs: Ventilated difficult to assess due to mechanical sounds Heart:  Regular rate and rhythm; no murmurs, clicks, rubs,  or gallops. Abdomen:  Distended and hypoactive  Msk:  Symmetrical without gross deformities. Normal posture. Pulses:  No carotid, renal, femoral bruits. DP and PT symmetrical and equal Extremities:  Without clubbing or edema. Neurologic:  Alert and  oriented x4;  grossly normal neurologically. Skin:  Intact without significant lesions or rashes. Cervical Nodes:  No significant cervical adenopathy. Psych:  Alert and cooperative. Normal mood and affect.  Intake/Output from previous day: 04/13 0701 - 04/14 0700 In: 153.9 [I.V.:93.9; NG/GT:60] Out: 450  Intake/Output this shift:    Lab Results:  Recent Labs  07/01/12 1627 07/02/12 0200  WBC 8.3 11.5*  HGB 9.9* 10.2*  HCT 30.3* 31.3*  PLT 178 219   BMET  Recent Labs  07/01/12 1627 07/02/12 0200  NA 134* 132*  K 3.1* 3.4*  CL 90* 87*  CO2 29 23  GLUCOSE 105* 111*  BUN 63* 65*  CREATININE 17.74* 17.82*  CALCIUM 8.5 8.6   LFT  Recent Labs  07/01/12 1627  PROT 7.6  ALBUMIN 2.6*  AST 30  ALT 33  ALKPHOS 58  BILITOT 0.3   PT/INR No results found for this basename: LABPROT, INR,  in the last 72 hours Hepatitis Panel No results found for this basename: HEPBSAG, HCVAB, HEPAIGM, HEPBIGM,  in the last 72 hours  Studies/Results: Ct Abdomen Pelvis W Contrast  07/01/2012  *RADIOLOGY REPORT*  Clinical Data: Abdominal pain.  Peritoneal dialysis.  CT ABDOMEN AND PELVIS WITH CONTRAST  Technique:  Multidetector CT imaging of the abdomen and pelvis was performed following the standard protocol during bolus administration of intravenous  contrast.  Contrast: OMNIPAQUE IOHEXOL 300 MG/ML  SOLN  Comparison: None.  Findings: There is cardiomegaly with slight bibasilar atelectasis, right more than left as well as pulmonary vascular prominence. No pleural effusions.  The liver, spleen, pancreas, adrenal glands are normal.  Bilateral renal atrophy with non-enhancing small lesions in both kidneys, consistent with benign cysts.  Biliary tree is normal.  There are no dilated loops of large or small bowel.  Terminal ileum is normal.  Prostate gland is not enlarged.  There is moderate fluid in the abdomen consistent with the patient's peritoneal dialysis.  Peritoneal dialysis catheter is present.  IMPRESSION: No acute abnormality of the abdomen or pelvis. Peritoneal dialysate in the abdomen.   Original Report Authenticated By: Francene Boyers, M.D.    Dg Chest Port 1 View  07/02/2012  *RADIOLOGY REPORT*  Clinical Data:  Line placement  PORTABLE CHEST - 1 VIEW  Comparison: Portable exam 0846 hours compared to 07/01/2012  Findings: Tip of endotracheal tube projects 2.8 cm above carina. Nasogastric tube extends into stomach. New left jugular central venous catheter tip projecting over the right atrium; recommend withdrawal 2.0 cm. Enlargement of cardiac silhouette with pulmonary vascular congestion. Bibasilar opacities persist, atelectasis at right base with atelectasis versus consolidation left lower lobe. Upper lungs clear. No pneumothorax.  IMPRESSION: Tip of left jugular catheter projects over high right atrium; recommend withdrawal 2.0 cm. Persistent bibasilar opacities greater on left, unchanged. No pneumothorax following line placement.  Findings called to Joy RN on 2900 on 07/02/2012 at 0851 hours.   Original Report Authenticated By: Ulyses Southward, M.D.    Dg Chest Portable 1 View  07/01/2012  *RADIOLOGY REPORT*  Clinical Data: Endotracheal tube placement; abdominal pain.  PORTABLE CHEST - 1 VIEW  Comparison: Chest radiograph performed  06/19/2012   Findings: The patient's endotracheal tube is seen ending 2-3 cm above the carina.  The lungs are significantly hypoexpanded.  Patchy bibasilar airspace opacities may reflect atelectasis or possibly pneumonia. Vascular crowding and likely vascular congestion are seen.  A small left pleural effusion is suspected.  No pneumothorax is seen.  The cardiomediastinal silhouette is borderline normal in size.  No acute osseous abnormalities are identified.  IMPRESSION:  1.  Endotracheal tube seen ending 2-3 cm above the carina. 2.  Lungs significantly hypoexpanded.  Patchy bibasilar airspace opacities may reflect atelectasis or possibly pneumonia.  Likely vascular congestion and small left pleural effusion.   Original Report Authenticated By: Tonia Ghent, M.D.     Assessment/Plan:  ESRD Patient receives dialysis elsewhere. Patient hypotensive and now on pressors. Appears septic possibly peritonitis. Given empiric antibiotics and checking cultures.  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 Will start CVVHDF   LOS: 1 Rubi Tooley W @TODAY @10 :04 AM     LOS: 1 Antwaine Boomhower W @TODAY @10 :04 AM

## 2012-07-02 NOTE — Progress Notes (Signed)
07/02/2012 9:35 AM  Spoke with Dr. Hyman Hopes prior to attempting CAPD exchange.  Plan is to transition patient to CVVHD today.  Instructed by Dr. Hyman Hopes to go ahead and drain the patient's abdomen and leave dry.  Orders received for every three day standard flushes until told otherwise.  Pt tolerated drain well.  See flowsheet for documentation. Garner Nash, Earley Favor

## 2012-07-02 NOTE — Procedures (Signed)
Central Venous Catheter Insertion Procedure Note Gary Rivera [id number] [birth year]  Procedure: Insertion of Central Venous Catheter Indications: Assessment of intravascular volume, Drug and/or fluid administration and Frequent blood sampling  Procedure Details Consent: Risks of procedure as well as the alternatives and risks of each were explained to the (patient/caregiver).  Consent for procedure obtained. Time Out: Verified patient identification, verified procedure, site/side was marked, verified correct patient position, special equipment/implants available, medications/allergies/relevent history reviewed, required imaging and test results available.  Performed  Maximum sterile technique was used including antiseptics, cap, gloves, gown, hand hygiene, mask and sheet. Skin prep: Chlorhexidine; local anesthetic administered A antimicrobial bonded/coated triple lumen catheter was placed in the left internal jugular vein using the Seldinger technique. Ultrasound guidance used.yes Catheter placed to 20 cm. Blood aspirated via all 3 ports and then flushed x 3. Line sutured x 2 and dressing applied.  Evaluation Blood flow good Complications: No apparent complications Patient did tolerate procedure well. Chest X-ray ordered to verify placement.  CXR: normal.  [name] Minor ACNP [facility] Pollack PCCM Pager [phone] till 3 pm If no answer page [phone] 07/02/2012, 10:23 AM   I was present for and supervised the entire procedure  Billy Fischer, MD ; [facility] service Mobile [phone].  After 5:30 PM or weekends, call [phone]

## 2012-07-02 NOTE — Progress Notes (Signed)
INITIAL NUTRITION ASSESSMENT  DOCUMENTATION CODES Per approved criteria  -Morbid Obesity   INTERVENTION: 1.  Enteral nutrition; Consider initiation of nutrition support if pt to remain intubated.  Recommend Promote @ 20 mL/hr continuous. Advance by 10 mL q 4 hrs to 40 mL/hr continuous with Prostat 60 mL TID to provide 1845 kcal, 150g protein, 805 mL free water.  NUTRITION DIAGNOSIS: Inadequate oral intake related to inability to eat as evidenced by intubated, NPO.   Monitor:  1.  Enteral nutrition; initiation with tolerance if pt to remain intubated.  Enteral nutrition to provide 60-70% of estimated calorie needs (22-25 kcals/kg ideal body weight) and 100% of estimated protein needs, based on ASPEN guidelines for permissive underfeeding in critically ill obese individuals. 2.  Wt/wt change; monitor trends 3.  Nutrition-related labs; monitor trends  Reason for Assessment: vent  52 y.o. male  Admitting Dx: Acute respiratory failure with hypercapnia  ASSESSMENT: Pt admitted with VDRF. No family available for nutrition-related hx.  Pt  Patient is currently intubated on ventilator support.  MV: 9.8 L/min Temp:Temp (24hrs), Avg:100.1 F (37.8 C), Min:98.9 F (37.2 C), Max:101.3 F (38.5 C)  Propofol: 10.8 ml/hr which provide 285 kcal/day  Pt to transition to CVVHD today.  Pt receives PD at home.  Labs currently wnl.  Height: Ht Readings from Last 1 Encounters:  07/02/12 6' (1.829 m)    Weight: Wt Readings from Last 1 Encounters:  07/02/12 314 lb 9.5 oz (142.7 kg)    Ideal Body Weight: 80.9 kg  % Ideal Body Weight: 176%  Wt Readings from Last 10 Encounters:  07/02/12 314 lb 9.5 oz (142.7 kg)  03/09/12 326 lb (147.873 kg)  02/01/12 317 lb (143.79 kg)  12/30/11 315 lb (142.883 kg)  09/21/11 295 lb 6.7 oz (134 kg)    Usual Body Weight: variable, pt on PD, appears stable  BMI:  Body mass index is 42.66 kg/(m^2).  Estimated Nutritional Needs: Kcal: 2750 Permissive  underfeeding goals:  8295-6213 Protein: 160g Fluid: ~2.0 L/day or per MD discretion  Skin: intact  Diet Order: NPO  EDUCATION NEEDS: -Education not appropriate at this time   Intake/Output Summary (Last 24 hours) at 07/02/12 1353 Last data filed at 07/02/12 1300  Gross per 24 hour  Intake 411.33 ml  Output    450 ml  Net -38.67 ml    Last BM: PTA, distention  Labs:   Recent Labs Lab 07/01/12 1627 07/02/12 0200  NA 134* 132*  K 3.1* 3.4*  CL 90* 87*  CO2 29 23  BUN 63* 65*  CREATININE 17.74* 17.82*  CALCIUM 8.5 8.6  MG  --  1.9  GLUCOSE 105* 111*    CBG (last 3)   Recent Labs  07/02/12 0722 07/02/12 1146  GLUCAP 112* 89    Scheduled Meds: . aspirin  325 mg Per Tube Daily  . calcitRIOL  0.25 mcg Oral Custom  . chlorhexidine  15 mL Mouth/Throat BID  . lanthanum  1,000 mg Oral TID WC  . pantoprazole (PROTONIX) IV  40 mg Intravenous QHS  . piperacillin-tazobactam (ZOSYN)  IV  2.25 g Intravenous Q8H  . sodium chloride  3 mL Intravenous Q12H    Continuous Infusions: . norepinephrine (LEVOPHED) Adult infusion 5 mcg/min (07/02/12 0800)  . dialysis replacement fluid (prismasate)    . dialysis replacement fluid (prismasate)    . dialysate (PRISMASATE)    . propofol 12 mcg/kg/min (07/02/12 1200)    Past Medical History  Diagnosis Date  . Hypertension   .  Renal disorder   . CHF (congestive heart failure)   . Peritoneal dialysis catheter in place   . Anemia     patient reporats that last hgb 7.9 a week ago  . Anxiety state, unspecified     Past Surgical History  Procedure Laterality Date  . Appendectomy    . Peritoneal catheter insertion      Loyce Dys, MS RD LDN Clinical Inpatient Dietitian Pager: [phone] Weekend/After hours pager: [phone]

## 2012-07-02 NOTE — Progress Notes (Signed)
ANTIBIOTIC CONSULT NOTE - INITIAL  Pharmacy Consult for Vancocin and Zosyn Indication: rule out pneumonia  Allergies  Allergen Reactions  . Renvela (Sevelamer) Nausea And Vomiting    Patient Measurements: Height: 6' (182.9 cm) Weight: 314 lb 9.5 oz (142.7 kg) IBW/kg (Calculated) : 77.6  Vital Signs: Temp: 99.7 F (37.6 C) (04/14 0015) Temp src: Core (Comment) (04/13 2345) BP: 86/55 mmHg (04/14 0007) Pulse Rate: 95 (04/14 0015)  Labs:  Recent Labs  07/01/12 1627  WBC 8.3  HGB 9.9*  PLT 178  CREATININE 17.74*   Estimated Creatinine Clearance: 7.1 ml/min (by C-G formula based on Cr of 17.74).   Microbiology: Recent Results (from the past 720 hour(s))  BODY FLUID CULTURE     Status: None   Collection Time    06/19/12  8:22 PM      Result Value Range Status   Specimen Description PERITONEAL FLUID   Final   Special Requests NONE   Final   Gram Stain     Final   Value: NO WBC SEEN     NO ORGANISMS SEEN   Culture NO GROWTH 3 DAYS   Final   Report Status 06/23/2012 FINAL   Final    Medical History: Past Medical History  Diagnosis Date  . Hypertension   . Renal disorder   . CHF (congestive heart failure)   . Peritoneal dialysis catheter in place   . Anemia     patient reporats that last hgb 7.9 a week ago  . Anxiety state, unspecified     Medications:  Prescriptions prior to admission  Medication Sig Dispense Refill  . albuterol (PROVENTIL HFA;VENTOLIN HFA) 108 (90 BASE) MCG/ACT inhaler Inhale 2 puffs into the lungs every 6 (six) hours as needed for wheezing.  1 Inhaler  2  . aspirin EC 81 MG tablet Take 81 mg by mouth daily.      Marland Kitchen b complex-vitamin c-folic acid (NEPHRO-VITE) 0.8 MG TABS Take 0.8 mg by mouth daily.      . bisoprolol (ZEBETA) 5 MG tablet Take 2.5 mg by mouth daily.      . calcitRIOL (ROCALTROL) 0.25 MCG capsule Take 0.25 mcg by mouth See admin instructions. Takes 1 capsule daily Monday through friday      . clonazePAM (KLONOPIN) 0.5 MG  tablet Take 0.5 mg by mouth 2 (two) times daily as needed. anxiety      . famotidine (PEPCID) 20 MG tablet Take 20 mg by mouth at bedtime as needed for heartburn.      . lanthanum (FOSRENOL) 1000 MG chewable tablet Chew 1,000 mg by mouth 3 (three) times daily with meals.      . Multiple Vitamin (MULTIVITAMIN) tablet Take 1 tablet by mouth daily.      Marland Kitchen omeprazole (PRILOSEC) 20 MG capsule Take 20 mg by mouth daily.      . vitamin B-12 (CYANOCOBALAMIN) 100 MCG tablet Take 50 mcg by mouth daily.       Scheduled:  . aspirin EC  81 mg Oral Daily  . bisoprolol  2.5 mg Oral Daily  . calcitRIOL  0.25 mcg Oral See admin instructions  . [COMPLETED] cefTRIAXone (ROCEPHIN)  IV  1 g Intravenous Once  . [COMPLETED] etomidate      . etomidate      . [COMPLETED] furosemide  80 mg Intravenous Once  . heparin  5,000 Units Subcutaneous Q8H  . heparin  5,000 Units Subcutaneous Q8H  . lanthanum  1,000 mg Oral TID WC  . [  COMPLETED] LORazepam      . [COMPLETED]  morphine injection  8 mg Intravenous Once  . [COMPLETED] naLOXone (NARCAN)  injection  0.4 mg Intravenous Once  . [COMPLETED] ondansetron (ZOFRAN) IV  4 mg Intravenous Once  . pantoprazole  40 mg Oral Daily  . pantoprazole (PROTONIX) IV  40 mg Intravenous QHS  . piperacillin-tazobactam (ZOSYN)  IV  2.25 g Intravenous Once  . piperacillin-tazobactam (ZOSYN)  IV  2.25 g Intravenous Q8H  . [COMPLETED] propofol      . [COMPLETED] rocuronium      . rocuronium      . sodium chloride  3 mL Intravenous Q12H  . succinylcholine      . vancomycin  2,500 mg Intravenous Q12H  . [DISCONTINUED] azithromycin (ZITHROMAX) 500 MG IVPB  500 mg Intravenous Once  . [DISCONTINUED] dialysis solution 1.5% low-MG/low-CA   Intraperitoneal Once in dialysis  . [DISCONTINUED] furosemide  80 mg Intravenous Once  . [DISCONTINUED] lidocaine (cardiac) 100 mg/25ml      . [DISCONTINUED] lidocaine (cardiac) 100 mg/69ml      . [DISCONTINUED] succinylcholine         Assessment: 52yo male with ESRD on PD presented to ED with abdominal pain and developed acute respiratory failure after morphine with little improvement from Narcan, now intubated, CXR shows possible PNA, to begin IV ABX.  Goal of Therapy:  Vancomycin trough level 15-20 mcg/ml  Plan:  Will give vancomycin 2500mg  IV x1 and check random levels for redosing, start Zosyn 2.25g IV Q8H, and monitor CBC, Cx, levels prn.  Gary Rivera, PharmD, BCPS  07/02/2012,12:55 AM

## 2012-07-02 NOTE — Progress Notes (Signed)
ANTIBIOTIC CONSULT NOTE - FOLLOW UP  Pharmacy Consult for vancomycin and Zosyn Indication: rule out pneumonia  Allergies  Allergen Reactions  . Renvela (Sevelamer) Nausea And Vomiting    Patient Measurements: Height: 6' (182.9 cm) Weight: 314 lb 9.5 oz (142.7 kg) IBW/kg (Calculated) : 77.6  Labs:  Recent Labs  07/01/12 1627 07/02/12 0200  WBC 8.3 11.5*  HGB 9.9* 10.2*  PLT 178 219  CREATININE 17.74* 17.82*   Estimated Creatinine Clearance: 7.1 ml/min (by C-G formula based on Cr of 17.82). No results found for this basename: VANCOTROUGH, Leodis Binet, VANCORANDOM, GENTTROUGH, GENTPEAK, GENTRANDOM, TOBRATROUGH, TOBRAPEAK, TOBRARND, AMIKACINPEAK, AMIKACINTROU, AMIKACIN,  in the last 72 hours   Microbiology: Recent Results (from the past 720 hour(s))  BODY FLUID CULTURE     Status: None   Collection Time    06/19/12  8:22 PM      Result Value Range Status   Specimen Description PERITONEAL FLUID   Final   Special Requests NONE   Final   Gram Stain     Final   Value: NO WBC SEEN     NO ORGANISMS SEEN   Culture NO GROWTH 3 DAYS   Final   Report Status 06/23/2012 FINAL   Final  MRSA PCR SCREENING     Status: None   Collection Time    07/02/12 12:17 AM      Result Value Range Status   MRSA by PCR NEGATIVE  NEGATIVE Final   Comment:            The GeneXpert MRSA Assay (FDA     approved for NASAL specimens     only), is one component of a     comprehensive MRSA colonization     surveillance program. It is not     intended to diagnose MRSA     infection nor to guide or     monitor treatment for     MRSA infections.  BODY FLUID CULTURE     Status: None   Collection Time    07/02/12  5:57 AM      Result Value Range Status   Specimen Description FLUID PERITONEAL   Final   Special Requests Normal   Final   Gram Stain     Final   Value: ABUNDANT WBC PRESENT,BOTH PMN AND MONONUCLEAR     NO ORGANISMS SEEN   Culture PENDING   Incomplete   Report Status PENDING   Incomplete     Anti-infectives   Start     Dose/Rate Route Frequency Ordered Stop   07/02/12 2200  piperacillin-tazobactam (ZOSYN) IVPB 3.375 g     3.375 g 12.5 mL/hr over 240 Minutes Intravenous Every 6 hours 07/02/12 1703     07/02/12 2000  vancomycin (VANCOCIN) 1,500 mg in sodium chloride 0.9 % 500 mL IVPB     1,500 mg 250 mL/hr over 120 Minutes Intravenous Every 24 hours 07/02/12 1708     07/02/12 0800  piperacillin-tazobactam (ZOSYN) IVPB 2.25 g  Status:  Discontinued     2.25 g 100 mL/hr over 30 Minutes Intravenous Every 8 hours 07/02/12 0054 07/02/12 1703   07/02/12 0100  vancomycin (VANCOCIN) 2,500 mg in sodium chloride 0.9 % 500 mL IVPB     2,500 mg 250 mL/hr over 120 Minutes Intravenous Every 12 hours 07/02/12 0054 07/02/12 0409   07/02/12 0100  piperacillin-tazobactam (ZOSYN) IVPB 2.25 g     2.25 g 100 mL/hr over 30 Minutes Intravenous  Once 07/02/12 0054 07/02/12 0519  07/01/12 2245  cefTRIAXone (ROCEPHIN) 1 g in dextrose 5 % 50 mL IVPB     1 g 100 mL/hr over 30 Minutes Intravenous  Once 07/01/12 2236 07/01/12 2336   07/01/12 2245  azithromycin (ZITHROMAX) 500 mg in dextrose 5 % 250 mL IVPB  Status:  Discontinued     500 mg 250 mL/hr over 60 Minutes Intravenous  Once 07/01/12 2236 07/02/12 5784      Assessment: 52 y/o male with ESRD previously on PD and now transitioned to CRRT. Pharmacy consulted to mange vancomycin and Zosyn for PNA. Will need to adjust antibiotics while on CRRT and when he transitions to HD. Cultures are pending.  Goal of Therapy:  Vancomycin trough level 15-20 mcg/ml  Plan:  -Change Zosyn to 3.375 g IV q6h -Change vancomycin to 1500 mg IV q24h -Will follow-up plans for HD  [facility], Pharm.D., BCPS Clinical Pharmacist Pager: [phone] 07/02/2012 5:11 PM

## 2012-07-02 NOTE — Progress Notes (Signed)
Dr. Sung Amabile made aware of need to pull central line back 2 cm per the radiologist.

## 2012-07-03 ENCOUNTER — Inpatient Hospital Stay (HOSPITAL_COMMUNITY): Payer: Medicare Other

## 2012-07-03 DIAGNOSIS — R0989 Other specified symptoms and signs involving the circulatory and respiratory systems: Secondary | ICD-10-CM

## 2012-07-03 DIAGNOSIS — E669 Obesity, unspecified: Secondary | ICD-10-CM

## 2012-07-03 LAB — CBC
HCT: 26.3 % — ABNORMAL LOW (ref 39.0–52.0)
Hemoglobin: 8.6 g/dL — ABNORMAL LOW (ref 13.0–17.0)
MCH: 30 pg (ref 26.0–34.0)
MCHC: 32.7 g/dL (ref 30.0–36.0)
MCV: 91.6 fL (ref 78.0–100.0)
RBC: 2.87 MIL/uL — ABNORMAL LOW (ref 4.22–5.81)

## 2012-07-03 LAB — POCT I-STAT 3, ART BLOOD GAS (G3+)
Bicarbonate: 25.5 mEq/L — ABNORMAL HIGH (ref 20.0–24.0)
O2 Saturation: 94 %
TCO2: 27 mmol/L (ref 0–100)
pO2, Arterial: 73 mmHg — ABNORMAL LOW (ref 80.0–100.0)

## 2012-07-03 LAB — BODY FLUID CELL COUNT WITH DIFFERENTIAL
Eos, Fluid: 10 %
Total Nucleated Cell Count, Fluid: 1364 cu mm — ABNORMAL HIGH (ref 0–1000)

## 2012-07-03 LAB — RENAL FUNCTION PANEL
Albumin: 2.2 g/dL — ABNORMAL LOW (ref 3.5–5.2)
BUN: 47 mg/dL — ABNORMAL HIGH (ref 6–23)
Chloride: 96 mEq/L (ref 96–112)
Glucose, Bld: 103 mg/dL — ABNORMAL HIGH (ref 70–99)
Potassium: 3 mEq/L — ABNORMAL LOW (ref 3.5–5.1)

## 2012-07-03 MED ORDER — PRISMASOL BGK 4/2.5 32-4-2.5 MEQ/L IV SOLN
INTRAVENOUS | Status: DC
  Administered 2012-07-03: 12:00:00 via INTRAVENOUS_CENTRAL
  Filled 2012-07-03 (×5): qty 5000

## 2012-07-03 MED ORDER — PRISMASOL BGK 4/2.5 32-4-2.5 MEQ/L IV SOLN
INTRAVENOUS | Status: DC
  Filled 2012-07-03 (×2): qty 5000

## 2012-07-03 MED ORDER — CLONAZEPAM 0.5 MG PO TABS
0.5000 mg | ORAL_TABLET | Freq: Two times a day (BID) | ORAL | Status: DC | PRN
  Administered 2012-07-04 – 2012-07-07 (×2): 0.5 mg via ORAL
  Filled 2012-07-03 (×3): qty 1

## 2012-07-03 MED ORDER — PRISMASOL BGK 4/2.5 32-4-2.5 MEQ/L IV SOLN
INTRAVENOUS | Status: DC
  Administered 2012-07-03 – 2012-07-04 (×6): via INTRAVENOUS_CENTRAL
  Filled 2012-07-03 (×18): qty 5000

## 2012-07-03 MED ORDER — HEPARIN SODIUM (PORCINE) 1000 UNIT/ML DIALYSIS
1000.0000 [IU] | INTRAMUSCULAR | Status: DC | PRN
Start: 2012-07-03 — End: 2012-07-04
  Administered 2012-07-04 (×2): 1200 [IU] via INTRAVENOUS_CENTRAL
  Filled 2012-07-03: qty 6

## 2012-07-03 MED ORDER — SODIUM CHLORIDE 0.9 % FOR CRRT
INTRAVENOUS_CENTRAL | Status: DC | PRN
  Filled 2012-07-03: qty 1000

## 2012-07-03 MED ORDER — PRISMASOL BGK 4/2.5 32-4-2.5 MEQ/L IV SOLN
INTRAVENOUS | Status: DC
  Filled 2012-07-03 (×3): qty 5000

## 2012-07-03 NOTE — ED Provider Notes (Signed)
I saw and evaluated the patient, reviewed the resident's note and I agree with the findings and plan.  Please see completed note. Pt intubated by Dr Beverely Pace under my direct supervision.   Raeford Razor, MD 07/03/12 [phone]

## 2012-07-03 NOTE — Procedures (Signed)
Extubation Procedure Note  Patient Details:   Name: EUELL SCHIFF DOB: 1960-05-27 MRN: [id number]  Pt extubated to 4L [place] per MD order, tolerating well able to vocalize, no stridor noted. VS stable, RT will continue to monitor.    Evaluation  O2 sats: stable throughout Complications: No apparent complications Patient did tolerate procedure well. Bilateral Breath Sounds: Rhonchi Suctioning: Airway Yes  Harley Hallmark 07/03/2012, 10:31 AM

## 2012-07-03 NOTE — Progress Notes (Signed)
[facility] ROUNDING NOTE   Subjective:   Interval History: intubated and alert and able to write. Appears improved. Low dose Norepinephrine  Objective:  Vital signs in last 24 hours:  Temp:  [97.3 F (36.3 C)-98.9 F (37.2 C)] 97.4 F (36.3 C) (04/15 0800) Pulse Rate:  [56-87] 76 (04/15 1029) Resp:  [11-23] 20 (04/15 1029) BP: (79-168)/(51-95) 124/77 mmHg (04/15 1029) SpO2:  [93 %-100 %] 96 % (04/15 1029) FiO2 (%):  [40 %] 40 % (04/15 1000) Weight:  [143.1 kg (315 lb 7.7 oz)] 143.1 kg (315 lb 7.7 oz) (04/15 0500)  Weight change: 0.4 kg (14.1 oz) Filed Weights   07/02/12 0007 07/02/12 0500 07/03/12 0500  Weight: 142.7 kg (314 lb 9.5 oz) 142.7 kg (314 lb 9.5 oz) 143.1 kg (315 lb 7.7 oz)    Intake/Output: I/O last 3 completed shifts: In: 1820.8 [I.V.:1013.3; NG/GT:120; IV Piggyback:687.5] Out: 1653 [Other:1653]   Intake/Output this shift:  Total I/O In: 157.6 [I.V.:72.5; NG/GT:60; IV Piggyback:25.1] Out: 168 [Other:168]  CVS- RRR RS- CTA intubated ABD- hypoactive BS EXT- no edema   Basic Metabolic Panel:  Recent Labs Lab 07/01/12 1627 07/02/12 0200 07/03/12 0358  NA 134* 132* 136  K 3.1* 3.4* 3.0*  CL 90* 87* 96  CO2 29 23 26   GLUCOSE 105* 111* 103*  BUN 63* 65* 47*  CREATININE 17.74* 17.82* 12.57*  CALCIUM 8.5 8.6 8.6  MG  --  1.9  --   PHOS  --   --  5.4*    Liver Function Tests:  Recent Labs Lab 07/01/12 1627 07/03/12 0358  AST 30  --   ALT 33  --   ALKPHOS 58  --   BILITOT 0.3  --   PROT 7.6  --   ALBUMIN 2.6* 2.2*    Recent Labs Lab 07/01/12 1627  LIPASE 41   No results found for this basename: AMMONIA,  in the last 168 hours  CBC:  Recent Labs Lab 07/01/12 1627 07/02/12 0200 07/03/12 0358  WBC 8.3 11.5* 7.5  NEUTROABS 6.3  --   --   HGB 9.9* 10.2* 8.6*  HCT 30.3* 31.3* 26.3*  MCV 93.2 93.7 91.6  PLT 178 219 153    Cardiac Enzymes:  Recent Labs Lab 07/01/12 2123  TROPONINI <0.30    BNP: No  components found with this basename: POCBNP,   CBG:  Recent Labs Lab 07/02/12 0722 07/02/12 1146  GLUCAP 112* 89    Microbiology: Results for orders placed during the hospital encounter of 07/01/12  MRSA PCR SCREENING     Status: None   Collection Time    07/02/12 12:17 AM      Result Value Range Status   MRSA by PCR NEGATIVE  NEGATIVE Final   Comment:            The GeneXpert MRSA Assay (FDA     approved for NASAL specimens     only), is one component of a     comprehensive MRSA colonization     surveillance program. It is not     intended to diagnose MRSA     infection nor to guide or     monitor treatment for     MRSA infections.  CULTURE, BLOOD (ROUTINE X 2)     Status: None   Collection Time    07/02/12  3:30 AM      Result Value Range Status   Specimen Description BLOOD LEFT HAND   Final   Special  Requests BOTTLES DRAWN AEROBIC ONLY 6CC   Final   Culture  Setup Time 07/02/2012 07:34   Final   Culture     Final   Value:        BLOOD CULTURE RECEIVED NO GROWTH TO DATE CULTURE WILL BE HELD FOR 5 DAYS BEFORE ISSUING A FINAL NEGATIVE REPORT   Report Status PENDING   Incomplete  CULTURE, BLOOD (ROUTINE X 2)     Status: None   Collection Time    07/02/12  3:45 AM      Result Value Range Status   Specimen Description BLOOD LEFT HAND   Final   Special Requests BOTTLES DRAWN AEROBIC ONLY 3CC   Final   Culture  Setup Time 07/02/2012 07:34   Final   Culture     Final   Value:        BLOOD CULTURE RECEIVED NO GROWTH TO DATE CULTURE WILL BE HELD FOR 5 DAYS BEFORE ISSUING A FINAL NEGATIVE REPORT   Report Status PENDING   Incomplete  BODY FLUID CULTURE     Status: None   Collection Time    07/02/12  5:57 AM      Result Value Range Status   Specimen Description FLUID PERITONEAL   Final   Special Requests Normal   Final   Gram Stain     Final   Value: ABUNDANT WBC PRESENT,BOTH PMN AND MONONUCLEAR     NO ORGANISMS SEEN   Culture PENDING   Incomplete   Report Status  PENDING   Incomplete    Coagulation Studies: No results found for this basename: LABPROT, INR,  in the last 72 hours  Urinalysis: No results found for this basename: COLORURINE, APPERANCEUR, LABSPEC, PHURINE, GLUCOSEU, HGBUR, BILIRUBINUR, KETONESUR, PROTEINUR, UROBILINOGEN, NITRITE, LEUKOCYTESUR,  in the last 72 hours    Imaging: Ct Abdomen Pelvis W Contrast  07/01/2012  *RADIOLOGY REPORT*  Clinical Data: Abdominal pain.  Peritoneal dialysis.  CT ABDOMEN AND PELVIS WITH CONTRAST  Technique:  Multidetector CT imaging of the abdomen and pelvis was performed following the standard protocol during bolus administration of intravenous contrast.  Contrast: OMNIPAQUE IOHEXOL 300 MG/ML  SOLN  Comparison: None.  Findings: There is cardiomegaly with slight bibasilar atelectasis, right more than left as well as pulmonary vascular prominence. No pleural effusions.  The liver, spleen, pancreas, adrenal glands are normal.  Bilateral renal atrophy with non-enhancing small lesions in both kidneys, consistent with benign cysts.  Biliary tree is normal.  There are no dilated loops of large or small bowel.  Terminal ileum is normal.  Prostate gland is not enlarged.  There is moderate fluid in the abdomen consistent with the patient's peritoneal dialysis.  Peritoneal dialysis catheter is present.  IMPRESSION: No acute abnormality of the abdomen or pelvis. Peritoneal dialysate in the abdomen.   Original Report Authenticated By: Francene Boyers, M.D.    Dg Chest Port 1 View  07/03/2012  *RADIOLOGY REPORT*  Clinical Data: Respiratory failure.  Endotracheal tube position.  PORTABLE CHEST - 1 VIEW  Comparison: 07/02/2012.  Findings: 05:37 hours. The endotracheal tube, nasogastric tube and bilateral central lines are unchanged.  There are persistent low lung volumes with bibasilar atelectasis.  No pneumothorax or significant pleural effusion is identified.  The heart size and mediastinal contours are stable.  IMPRESSION:  Stable chest.  No acute findings identified.   Original Report Authenticated By: Carey Bullocks, M.D.    Dg Chest Port 1 View  07/02/2012  *RADIOLOGY REPORT*  Clinical Data:  52 year old male with dialysis catheter placement.  PORTABLE CHEST - 1 VIEW  Comparison: 0846 hours the same day and earlier.  Findings: Interval right IJ approach dual lumen catheter placement. Tip is at the level of the carina.  Stable left IJ central line.  Endotracheal tube and visible enteric tube are stable.  No pneumothorax.  Stable lung volumes and ventilation.  Stable cardiac size and mediastinal contours.  IMPRESSION: 1.  Right IJ dual lumen catheter placed, tip at the level of the carina - SVC. No pneumothorax. 2. Otherwise, stable lines and tubes. 3.  Otherwise stable chest.   Original Report Authenticated By: Erskine Speed, M.D.    Dg Chest Port 1 View  07/02/2012  *RADIOLOGY REPORT*  Clinical Data:  Line placement  PORTABLE CHEST - 1 VIEW  Comparison: Portable exam 0846 hours compared to 07/01/2012  Findings: Tip of endotracheal tube projects 2.8 cm above carina. Nasogastric tube extends into stomach. New left jugular central venous catheter tip projecting over the right atrium; recommend withdrawal 2.0 cm. Enlargement of cardiac silhouette with pulmonary vascular congestion. Bibasilar opacities persist, atelectasis at right base with atelectasis versus consolidation left lower lobe. Upper lungs clear. No pneumothorax.  IMPRESSION: Tip of left jugular catheter projects over high right atrium; recommend withdrawal 2.0 cm. Persistent bibasilar opacities greater on left, unchanged. No pneumothorax following line placement.  Findings called to Joy RN on 2900 on 07/02/2012 at 0851 hours.   Original Report Authenticated By: Ulyses Southward, M.D.    Dg Chest Portable 1 View  07/01/2012  *RADIOLOGY REPORT*  Clinical Data: Endotracheal tube placement; abdominal pain.  PORTABLE CHEST - 1 VIEW  Comparison: Chest radiograph performed  06/19/2012  Findings: The patient's endotracheal tube is seen ending 2-3 cm above the carina.  The lungs are significantly hypoexpanded.  Patchy bibasilar airspace opacities may reflect atelectasis or possibly pneumonia. Vascular crowding and likely vascular congestion are seen.  A small left pleural effusion is suspected.  No pneumothorax is seen.  The cardiomediastinal silhouette is borderline normal in size.  No acute osseous abnormalities are identified.  IMPRESSION:  1.  Endotracheal tube seen ending 2-3 cm above the carina. 2.  Lungs significantly hypoexpanded.  Patchy bibasilar airspace opacities may reflect atelectasis or possibly pneumonia.  Likely vascular congestion and small left pleural effusion.   Original Report Authenticated By: Tonia Ghent, M.D.      Medications:   . norepinephrine (LEVOPHED) Adult infusion 3 mcg/min (07/03/12 0940)  . dialysis replacement fluid (prismasate) 300 mL/hr at 07/02/12 1553  . dialysis replacement fluid (prismasate) 300 mL/hr at 07/03/12 1039  . dialysate (PRISMASATE)    . propofol 15 mcg/kg/min (07/03/12 0800)   . antiseptic oral rinse  15 mL Mouth Rinse QID  . aspirin  325 mg Per Tube Daily  . calcitRIOL  0.25 mcg Oral Custom  . chlorhexidine  15 mL Mouth Rinse BID  . lanthanum  1,000 mg Oral TID WC  . pantoprazole (PROTONIX) IV  40 mg Intravenous QHS  . piperacillin-tazobactam (ZOSYN)  IV  3.375 g Intravenous Q6H  . sodium chloride  3 mL Intravenous Q12H  . vancomycin  1,500 mg Intravenous Q24H   sodium chloride, sodium chloride, sodium chloride, albuterol, heparin, heparin, sodium chloride, sodium chloride  Assessment/ Plan:  ESRD Patient receives dialysis with Dr Fatima Sanger at [facility] Possible bacterial peritonitis. Given empiric antibiotics and cultures pending ANEMIA stable  MBD- will assess  HTN/VOL-hypotensive - pressor requirements lower ACCESS- catheter placed for CVVHDF  Patient septic  shock from possible bacterial  peritonitis. Continue CVVHD today, hypokalemia will replete    LOS: 2 Gary Rivera W @TODAY @10 :39 AM

## 2012-07-03 NOTE — Evaluation (Signed)
Clinical/Bedside Swallow Evaluation Patient Details  Name: Gary Rivera MRN: [id number] Date of Birth: 05-27-1960  Today's Date: 07/03/2012 Time: 6578-4696 SLP Time Calculation (min): 20 min  Past Medical History:  Past Medical History  Diagnosis Date  . Hypertension   . Renal disorder   . CHF (congestive heart failure)   . Peritoneal dialysis catheter in place   . Anemia     patient reporats that last hgb 7.9 a week ago  . Anxiety state, unspecified    Past Surgical History:  Past Surgical History  Procedure Laterality Date  . Appendectomy    . Peritoneal catheter insertion     HPI:  Gary Rivera 52 yo AA man pmh of pulmonary HTN, systolic HF, ESRD on peritoneal dialysis, and OHS presented with abdominal pain after 8mg  of morphine developed acute respiratory failure with little improvement from narcan and refused bipap. Gary Rivera was intubated and PCCM called to manage.   Assessment / Plan / Recommendation Clinical Impression  Gary Rivera demonstrates swallow function WNL; no evidence of aspiration or decreased airway protection. Since Gary Rivera has had respiratory complications, education Gary Rivera to avoid drinking when significantly short of breath and to keep posture upright for POs. Gary Rivera and wife verbalized understanding. Recommend regular diet and thin liquids. No SLP f/u needed.     Aspiration Risk  Mild    Diet Recommendation Regular;Thin liquid   Liquid Administration via: Straw;Cup Medication Administration: Whole meds with liquid Supervision: Patient able to self feed Postural Changes and/or Swallow Maneuvers: Seated upright 90 degrees    Other  Recommendations     Follow Up Recommendations  None    Frequency and Duration        Pertinent Vitals/Pain NA    SLP Swallow Goals     Swallow Study Prior Functional Status       General HPI: Gary Rivera 52 yo AA man pmh of pulmonary HTN, systolic HF, ESRD on peritoneal dialysis, and OHS presented with abdominal pain after 8mg  of  morphine developed acute respiratory failure with little improvement from narcan and refused bipap. Gary Rivera was intubated and PCCM called to manage. Type of Study: Bedside swallow evaluation Previous Swallow Assessment: none Diet Prior to this Study: NPO Temperature Spikes Noted: No Respiratory Status: Supplemental O2 delivered via (comment) History of Recent Intubation: Yes Length of Intubations (days): 3 days Date extubated: 07/03/12 Behavior/Cognition: Alert Oral Cavity - Dentition: Adequate natural dentition Self-Feeding Abilities: Able to feed self Patient Positioning: Upright in bed Baseline Vocal Quality: Clear Volitional Cough: Strong Volitional Swallow: Able to elicit    Oral/Motor/Sensory Function Overall Oral Motor/Sensory Function: Appears within functional limits for tasks assessed   Ice Chips Ice chips: Within functional limits   Thin Liquid Thin Liquid: Within functional limits    Nectar Thick Nectar Thick Liquid: Not tested   Honey Thick Honey Thick Liquid: Not tested   Puree Puree: Within functional limits   Solid   GO    Solid: Within functional limits      [facility], MA CCC-SLP [phone]  Claudine Mouton 07/03/2012,3:00 PM

## 2012-07-03 NOTE — Progress Notes (Signed)
ANTIBIOTIC CONSULT NOTE - FOLLOW UP  Pharmacy Consult for vancomycin Indication: rule out pneumonia   Recent Labs  07/01/12 1627 07/02/12 0200 07/03/12 0358  WBC 8.3 11.5* 7.5  HGB 9.9* 10.2* 8.6*  PLT 178 219 153  CREATININE 17.74* 17.82* 12.57*   Estimated Creatinine Clearance: 10.1 ml/min (by C-G formula based on Cr of 12.57).  Recent Labs  07/03/12 0358  VANCORANDOM 41.0    Assessment: 52 yo male with ESRD previously on PD, transitioned to CRRT yesterday afternoon. Antibiotics adjusted yesterday for CRRT dosing. Vancomycin random this morning elevated at 41.   Goal of Therapy:  Vancomycin trough level 15-20 mcg/ml Clinical improvement  Plan:  1) Hold Vancomycin dose tonight 2) Check vancomycin random with AM labs to assess clearance & re-dosing   Benjaman Pott, PharmD, BCPS 07/03/2012   12:43 PM

## 2012-07-03 NOTE — Progress Notes (Signed)
eLink Physician-Brief Progress Note Patient Name: Gary Rivera DOB: Jan 04, 1961 MRN: [id number]  Date of Service  07/03/2012   HPI/Events of Note   Pt asking for klonopin prn , he takes this at home   eICU Interventions  Klonopin ordered   Intervention Category Minor Interventions: Routine modifications to care plan (e.g. PRN medications for pain, fever)  Shan Levans 07/03/2012, 6:19 PM

## 2012-07-03 NOTE — Progress Notes (Signed)
PULMONARY  / CRITICAL CARE MEDICINE  Name: Gary Rivera MRN: [id number] DOB: Dec 23, 1960    ADMISSION DATE:  07/01/2012 CONSULTATION DATE:  07/01/12  REFERRING MD :  Dr. Juleen China PRIMARY SERVICE: PCCM  CHIEF COMPLAINT:  Acute respiratory failure  BRIEF PATIENT DESCRIPTION: Gary Rivera 52 yo AA man pmh of pulmonary HTN, systolic HF, ESRD on peritoneal dialysis, and OHS presented with abdominal pain after 8mg  of morphine developed acute respiratory failure with little improvement from narcan and refused bipap. pt was intubated and PCCM called to manage.   SIGNIFICANT EVENTS / STUDIES:  4/13 Adm with dx sepsis, resp source suspected 4/13 CT abd: slight bibasilar infiltrates, no dilated bowels or acute process 4/13 CXR: bibasilar infiltrates, L effusion, under inflated lungs 4/14 Vasopressors and CRRT initiated 4/15 Extubated.  LINES / TUBES: ETT 4/13 >>4/15 L IJ CVL 4/14 >> R IJ HD cath >>   CULTURES: 4/13 peritoneal fluid Cx >> GS neg >>  4/13 sputum Cx >> 4/13 BCx>>> 4/13 UCx>>>  ANTIBIOTICS: 4/13 rocephin >>>4/13 4/13 azithromax >>>4/13 4/13 IV Vanc >>> 4/13 IV Zosyn >>>  CXR: NSC  SUBJECTIVE:  RASS -1. + F/C  VITAL SIGNS: Temp:  [97.3 F (36.3 C)-98.9 F (37.2 C)] 97.4 F (36.3 C) (04/15 0800) Pulse Rate:  [56-85] 76 (04/15 1029) Resp:  [11-23] 20 (04/15 1029) BP: (79-168)/(51-95) 124/77 mmHg (04/15 1029) SpO2:  [93 %-100 %] 96 % (04/15 1029) FiO2 (%):  [40 %] 40 % (04/15 1000) Weight:  [143.1 kg (315 lb 7.7 oz)] 143.1 kg (315 lb 7.7 oz) (04/15 0500) HEMODYNAMICS:   VENTILATOR SETTINGS: Vent Mode:  [-] PSV FiO2 (%):  [40 %] 40 % Set Rate:  [14 bmp] 14 bmp Vt Set:  [600 mL] 600 mL PEEP:  [5 cmH20] 5 cmH20 Pressure Support:  [5 cmH20] 5 cmH20 Plateau Pressure:  [21 cmH20-22 cmH20] 22 cmH20 INTAKE / OUTPUT: Intake/Output     04/14 0701 - 04/15 0700 04/15 0701 - 04/16 0700   I.V. (mL/kg) 919.4 (6.4) 72.5 (0.5)   NG/GT 60 60   IV Piggyback 687.5 25.1   Total Intake(mL/kg) 1666.9 (11.6) 157.6 (1.1)   Other 1203 168   Total Output 1203 168   Net +463.9 -10.5         PHYSICAL EXAMINATION: General:  Sedated, RASS -1 Neuro:  sedated HEENT:  ETT, OG draining bilious material, no JVD Cardiovascular:  RRR, no murmurs/rubs/gallops Lungs:  CTAB, no crackles/wheezes Abdomen:  Distended, peritoneal site non-erythematous/non-indurated, morbidly obese Musculoskeletal:  intact Skin:  Scaly, dry, warm, no LE edema  LABS:  Recent Labs Lab 07/01/12 1627  07/01/12 2045 07/01/12 2123 07/01/12 2235 07/02/12 0100 07/02/12 0200 07/02/12 0330 07/03/12 0358 07/03/12 1019  HGB 9.9*  --   --   --   --   --  10.2*  --  8.6*  --   WBC 8.3  --   --   --   --   --  11.5*  --  7.5  --   PLT 178  --   --   --   --   --  219  --  153  --   NA 134*  --   --   --   --   --  132*  --  136  --   K 3.1*  --   --   --   --   --  3.4*  --  3.0*  --   CL 90*  --   --   --   --   --  87*  --  96  --   CO2 29  --   --   --   --   --  23  --  26  --   GLUCOSE 105*  --   --   --   --   --  111*  --  103*  --   BUN 63*  --   --   --   --   --  65*  --  47*  --   CREATININE 17.74*  --   --   --   --   --  17.82*  --  12.57*  --   CALCIUM 8.5  --   --   --   --   --  8.6  --  8.6  --   MG  --   --   --   --   --   --  1.9  --   --   --   PHOS  --   --   --   --   --   --   --   --  5.4*  --   AST 30  --   --   --   --   --   --   --   --   --   ALT 33  --   --   --   --   --   --   --   --   --   ALKPHOS 58  --   --   --   --   --   --   --   --   --   BILITOT 0.3  --   --   --   --   --   --   --   --   --   PROT 7.6  --   --   --   --   --   --   --   --   --   ALBUMIN 2.6*  --   --   --   --   --   --   --  2.2*  --   LATICACIDVEN  --   --   --   --   --   --   --  3.3*  --   --   TROPONINI  --   --   --  <0.30  --   --   --   --   --   --   PROCALCITON  --   --   --   --   --  5.85  --   --   --   --   PHART  --   < > 7.142*  --  7.380  --   --   --   --   7.345*  PCO2ART  --   < > 94.2*  --  46.3*  --   --   --   --  46.7*  PO2ART  --   < > 126.0*  --  57.0*  --   --   --   --  73.0*  < > = values in this interval not displayed.  Recent Labs Lab 07/02/12 0722 07/02/12 1146  GLUCAP 112* 89    ASSESSMENT / PLAN:  PULMONARY A: Acute on chronic hypercarbic respiratory failure  Suspect component of possible pulm edema Probable PNA P:   - SBT to extubate today. -  Cont vent bundle. - BiPAP PRN post extubation. - Continue fluid negative.  CARDIOVASCULAR A: systolic heart failure EF 40% last Echo 6/13 Shock - likely septic P:  - Cont NE to maintain MAP > 65 mmHg but titrate down as tolerated.  RENAL A:  ESRD, chronic peritoneal dialysis  P:   - CRRT per Renal - Replace K in dilasate.  GASTROINTESTINAL A:  Presented with abd pain P:   - Swallow evaluation. - Renal and diabetic diet if passes.  HEMATOLOGIC A: No active issues P:  -Monitor CBC intermittently  INFECTIOUS A:  Probable PNA Severe sepsis P:   - Abx and micro as above  ENDOCRINE A:  No active issues P:   - Cont to monitor. - Initiate SSI for glu > 180.  NEUROLOGIC A:  sedated P:   - D/C sedation. - Daily WUA  TODAY'S SUMMARY: Extubate today, BiPAP on a PRN bases if needed.  Continue CVVH with negative volume removal and monitor.  CC Time 35 min.  Alyson Reedy, M.D. [facility] Pulmonary/Critical Care Medicine. Pager: [phone]. After hours pager: [phone].

## 2012-07-04 ENCOUNTER — Inpatient Hospital Stay (HOSPITAL_COMMUNITY): Payer: Medicare Other

## 2012-07-04 DIAGNOSIS — E662 Morbid (severe) obesity with alveolar hypoventilation: Secondary | ICD-10-CM

## 2012-07-04 DIAGNOSIS — R05 Cough: Secondary | ICD-10-CM

## 2012-07-04 LAB — CBC
HCT: 28 % — ABNORMAL LOW (ref 39.0–52.0)
Hemoglobin: 8.5 g/dL — ABNORMAL LOW (ref 13.0–17.0)
MCH: 29.3 pg (ref 26.0–34.0)
MCHC: 30.4 g/dL (ref 30.0–36.0)
MCV: 96.6 fL (ref 78.0–100.0)
RBC: 2.85 MIL/uL — ABNORMAL LOW (ref 4.22–5.81)
RDW: 15.6 % — ABNORMAL HIGH (ref 11.5–15.5)
WBC: 9.2 10*3/uL (ref 4.0–10.5)

## 2012-07-04 LAB — POCT I-STAT 3, ART BLOOD GAS (G3+)
Acid-base deficit: 2 mmol/L (ref 0.0–2.0)
O2 Saturation: 83 %

## 2012-07-04 LAB — BASIC METABOLIC PANEL
CO2: 25 mEq/L (ref 19–32)
GFR calc non Af Amer: 5 mL/min — ABNORMAL LOW (ref >90)
Glucose, Bld: 110 mg/dL — ABNORMAL HIGH (ref 70–99)
Potassium: 4.1 mEq/L (ref 3.5–5.1)
Sodium: 137 mEq/L (ref 135–145)

## 2012-07-04 LAB — RENAL FUNCTION PANEL
Albumin: 2.3 g/dL — ABNORMAL LOW (ref 3.5–5.2)
BUN: 29 mg/dL — ABNORMAL HIGH (ref 6–23)
Calcium: 8.5 mg/dL (ref 8.4–10.5)
Creatinine, Ser: 8.36 mg/dL — ABNORMAL HIGH (ref 0.50–1.35)
GFR calc non Af Amer: 7 mL/min — ABNORMAL LOW (ref >90)
Phosphorus: 5.7 mg/dL — ABNORMAL HIGH (ref 2.3–4.6)

## 2012-07-04 LAB — TROPONIN I: Troponin I: <0.3 ng/mL (ref <0.30)

## 2012-07-04 MED ORDER — DELFLEX-LC/1.5% DEXTROSE 346 MOSM/L IP SOLN
Freq: Once | INTRAPERITONEAL | Status: AC
  Administered 2012-07-04: 16:00:00 via INTRAPERITONEAL

## 2012-07-04 MED ORDER — PIPERACILLIN-TAZOBACTAM IN DEX 2-0.25 GM/50ML IV SOLN
2.2500 g | Freq: Three times a day (TID) | INTRAVENOUS | Status: DC
  Administered 2012-07-04 – 2012-07-08 (×10): 2.25 g via INTRAVENOUS
  Filled 2012-07-04 (×13): qty 50

## 2012-07-04 MED ORDER — DIPHENHYDRAMINE HCL 50 MG/ML IJ SOLN: 25.0000 mg | Freq: Every evening | INTRAMUSCULAR | Status: DC | PRN

## 2012-07-04 MED ORDER — DELFLEX-LC/1.5% DEXTROSE 346 MOSM/L IP SOLN: INTRAPERITONEAL | Status: DC

## 2012-07-04 MED ORDER — DELFLEX-LC/2.5% DEXTROSE 394 MOSM/L IP SOLN: INTRAPERITONEAL | Status: DC

## 2012-07-04 MED ORDER — FENTANYL CITRATE 0.05 MG/ML IJ SOLN
25.0000 ug | INTRAMUSCULAR | Status: DC | PRN
  Administered 2012-07-04: 50 ug via INTRAVENOUS
  Filled 2012-07-04: qty 2
  Filled 2012-07-04: qty 4

## 2012-07-04 MED ORDER — DELFLEX-LC/2.5% DEXTROSE 394 MOSM/L IP SOLN: Freq: Once | INTRAPERITONEAL | Status: DC

## 2012-07-04 MED ORDER — SIMETHICONE 80 MG PO CHEW
80.0000 mg | CHEWABLE_TABLET | Freq: Four times a day (QID) | ORAL | Status: DC | PRN
  Administered 2012-07-04: 80 mg via ORAL
  Filled 2012-07-04: qty 1

## 2012-07-04 NOTE — Progress Notes (Signed)
07/04/2012 patient transfer from 2900 to 6700 at 1530. Patient is alert, oriented and ambulatory with assist. Patient have right hemodialysis cath with a pigtail, right AC iv  a left triple lumen cath. He also have a peritoneal cath the area is clean and dry. Patient skin is fine, just dry no breakage noted. He was placed on telemetry when arrived on the unit. [facility] RN.

## 2012-07-04 NOTE — Progress Notes (Signed)
[facility] PCCM  I was called to evaluate Mr. Reihl emergently because of respiratory failure that developed suddenly on the floor.  Per nursing, he was feeling well between 8-9PM this evening but developed some abdominal pain and received fentanyl IV and sleep medication.  Not long afterwards he became tachypnic and hypoxemic.  RRT was called and he was moved to the ICU.  Filed Vitals:   07/04/12 2200 07/04/12 2255 07/04/12 2300 07/04/12 2315  BP:  83/53 94/52 79/46   Pulse: 114 114 109 109  Temp:  101.1 F (38.4 C)    TempSrc:  Axillary    Resp: 48 39 39 42  Height:      Weight:      SpO2: 94% 93% 94% 97%   Gen: obese, tachypnic but comfortable on BIPAP HEENT: NCAT, PERRL, EOMi, OP clear, neck supple without masses PULM: CTA B, no stridor CV: RRR, distant heart sounds, cannot assess JVD AB: BS+, mildly tender lower quadrants, no guarding or rebound, PD site clean  Ext: warm, trace edema, no clubbing, no cyanosis Derm: no rash or skin breakdown Neuro: Awake, alert, follows commands bilaterally  Abg noted, hypercarbic, hypoxemic CXR> low lung volumes, no real change  Impression: 1) Acute hypercarbic hypoxemic respiratory failure> related to sedation from fentanyl? Similar to presentation in ED; CXR unchanged 2) pulm hypertension 3) likely peritonitis 4) baseline hypotension (SBP 80's to 90's) 5) acute encephalopathy due to narcotics and to hypercarbia; improving with BIPAP 6) prior vocal cord paralysis, vcd, no stridor now 7) ESRD on PD  Plan: -continue BIPAP for now, O2 adjusted -repeat ABG in one hour -low threshold intubation -hold narcotics -restart PD now -continue antibiotics  CC time by me 45 minutes.  Yolonda Kida PCCM Pager: [phone] Cell: [phone] If no response, call [phone]

## 2012-07-04 NOTE — Progress Notes (Signed)
[facility] ROUNDING NOTE   Subjective:   Interval History: extubated and alert throat sore but no distress  Objective:  Vital signs in last 24 hours:  Temp:  [97.9 F (36.6 C)-98.6 F (37 C)] 98.6 F (37 C) (04/16 0400) Pulse Rate:  [72-103] 92 (04/16 0800) Resp:  [19-30] 24 (04/16 0800) BP: (80-157)/(46-89) 143/70 mmHg (04/16 0800) SpO2:  [86 %-99 %] 93 % (04/16 0800) FiO2 (%):  [40 %] 40 % (04/15 1000) Weight:  [143.473 kg (316 lb 4.8 oz)] 143.473 kg (316 lb 4.8 oz) (04/16 0500)  Weight change: 0.373 kg (13.2 oz) Filed Weights   07/02/12 0500 07/03/12 0500 07/04/12 0500  Weight: 142.7 kg (314 lb 9.5 oz) 143.1 kg (315 lb 7.7 oz) 143.473 kg (316 lb 4.8 oz)    Intake/Output: I/O last 3 completed shifts: In: 1573.8 [P.O.:120; I.V.:593.7; NG/GT:60; IV Piggyback:800.1] Out: 1224 [Other:1222; Stool:2]   Intake/Output this shift:  Total I/O In: 252.5 [P.O.:240; IV Piggyback:12.5] Out: -  No distress slight dypnea to conversation CVS- RRR systolic murmur Rigth IJ RS- CTA extubated stridorous  ABD- BS present soft non-distended EXT- no edema   Basic Metabolic Panel:  Recent Labs Lab 07/01/12 1627 07/02/12 0200 07/03/12 0358 07/04/12 0445  NA 134* 132* 136 139  K 3.1* 3.4* 3.0* 4.4  CL 90* 87* 96 101  CO2 29 23 26 27   GLUCOSE 105* 111* 103* 101*  BUN 63* 65* 47* 29*  CREATININE 17.74* 17.82* 12.57* 8.36*  CALCIUM 8.5 8.6 8.6 8.5  MG  --  1.9  --  2.5  PHOS  --   --  5.4* 5.7*    Liver Function Tests:  Recent Labs Lab 07/01/12 1627 07/03/12 0358 07/04/12 0445  AST 30  --   --   ALT 33  --   --   ALKPHOS 58  --   --   BILITOT 0.3  --   --   PROT 7.6  --   --   ALBUMIN 2.6* 2.2* 2.3*    Recent Labs Lab 07/01/12 1627  LIPASE 41   No results found for this basename: AMMONIA,  in the last 168 hours  CBC:  Recent Labs Lab 07/01/12 1627 07/02/12 0200 07/03/12 0358 07/04/12 0445  WBC 8.3 11.5* 7.5 9.2  NEUTROABS 6.3  --   --    --   HGB 9.9* 10.2* 8.6* 8.5*  HCT 30.3* 31.3* 26.3* 28.0*  MCV 93.2 93.7 91.6 96.6  PLT 178 219 153 171    Cardiac Enzymes:  Recent Labs Lab 07/01/12 2123  TROPONINI <0.30    BNP: No components found with this basename: POCBNP,   CBG:  Recent Labs Lab 07/02/12 0722 07/02/12 1146  GLUCAP 112* 89    Microbiology: Results for orders placed during the hospital encounter of 07/01/12  MRSA PCR SCREENING     Status: None   Collection Time    07/02/12 12:17 AM      Result Value Range Status   MRSA by PCR NEGATIVE  NEGATIVE Final   Comment:            The GeneXpert MRSA Assay (FDA     approved for NASAL specimens     only), is one component of a     comprehensive MRSA colonization     surveillance program. It is not     intended to diagnose MRSA     infection nor to guide or     monitor treatment for  MRSA infections.  CULTURE, BLOOD (ROUTINE X 2)     Status: None   Collection Time    07/02/12  3:30 AM      Result Value Range Status   Specimen Description BLOOD LEFT HAND   Final   Special Requests BOTTLES DRAWN AEROBIC ONLY 6CC   Final   Culture  Setup Time 07/02/2012 07:34   Final   Culture     Final   Value:        BLOOD CULTURE RECEIVED NO GROWTH TO DATE CULTURE WILL BE HELD FOR 5 DAYS BEFORE ISSUING A FINAL NEGATIVE REPORT   Report Status PENDING   Incomplete  CULTURE, BLOOD (ROUTINE X 2)     Status: None   Collection Time    07/02/12  3:45 AM      Result Value Range Status   Specimen Description BLOOD LEFT HAND   Final   Special Requests BOTTLES DRAWN AEROBIC ONLY 3CC   Final   Culture  Setup Time 07/02/2012 07:34   Final   Culture     Final   Value:        BLOOD CULTURE RECEIVED NO GROWTH TO DATE CULTURE WILL BE HELD FOR 5 DAYS BEFORE ISSUING A FINAL NEGATIVE REPORT   Report Status PENDING   Incomplete  BODY FLUID CULTURE     Status: None   Collection Time    07/02/12  5:57 AM      Result Value Range Status   Specimen Description FLUID PERITONEAL    Final   Special Requests Normal   Final   Gram Stain     Final   Value: ABUNDANT WBC PRESENT,BOTH PMN AND MONONUCLEAR     NO ORGANISMS SEEN   Culture NO GROWTH 1 DAY   Final   Report Status PENDING   Incomplete    Coagulation Studies: No results found for this basename: LABPROT, INR,  in the last 72 hours  Urinalysis: No results found for this basename: COLORURINE, APPERANCEUR, LABSPEC, PHURINE, GLUCOSEU, HGBUR, BILIRUBINUR, KETONESUR, PROTEINUR, UROBILINOGEN, NITRITE, LEUKOCYTESUR,  in the last 72 hours    Imaging: Dg Chest Port 1 View  07/03/2012  *RADIOLOGY REPORT*  Clinical Data: Respiratory failure.  Endotracheal tube position.  PORTABLE CHEST - 1 VIEW  Comparison: 07/02/2012.  Findings: 05:37 hours. The endotracheal tube, nasogastric tube and bilateral central lines are unchanged.  There are persistent low lung volumes with bibasilar atelectasis.  No pneumothorax or significant pleural effusion is identified.  The heart size and mediastinal contours are stable.  IMPRESSION: Stable chest.  No acute findings identified.   Original Report Authenticated By: Carey Bullocks, M.D.    Dg Chest Port 1 View  07/02/2012  *RADIOLOGY REPORT*  Clinical Data: 52 year old male with dialysis catheter placement.  PORTABLE CHEST - 1 VIEW  Comparison: 0846 hours the same day and earlier.  Findings: Interval right IJ approach dual lumen catheter placement. Tip is at the level of the carina.  Stable left IJ central line.  Endotracheal tube and visible enteric tube are stable.  No pneumothorax.  Stable lung volumes and ventilation.  Stable cardiac size and mediastinal contours.  IMPRESSION: 1.  Right IJ dual lumen catheter placed, tip at the level of the carina - SVC. No pneumothorax. 2. Otherwise, stable lines and tubes. 3.  Otherwise stable chest.   Original Report Authenticated By: Erskine Speed, M.D.      Medications:   . norepinephrine (LEVOPHED) Adult infusion 3 mcg/min (07/03/12 0940)  .  propofol 15  mcg/kg/min (07/03/12 0800)   . antiseptic oral rinse  15 mL Mouth Rinse QID  . aspirin  325 mg Per Tube Daily  . calcitRIOL  0.25 mcg Oral Custom  . lanthanum  1,000 mg Oral TID WC  . pantoprazole (PROTONIX) IV  40 mg Intravenous QHS  . piperacillin-tazobactam (ZOSYN)  IV  3.375 g Intravenous Q6H  . sodium chloride  3 mL Intravenous Q12H   sodium chloride, sodium chloride, sodium chloride, albuterol, clonazePAM, heparin, sodium chloride  Assessment/ Plan:  1.ESRD Patient receives dialysis with Dr Fatima Sanger at [facility]  2.Possible bacterial peritonitis. Given empiric antibiotics and cultures pending 3 ANEMIA stable 4 MBD- will assess 5 HTN/VOL-improved blood pressure ACCESS- catheter placed for CVVHDF will stop cvvhd and restart Peritoneal dialysis    Patient septic shock from possible bacterial peritonitis culture negative.Restart PD today     LOS: 3 Karynn Deblasi W @TODAY @9 :18 AM

## 2012-07-04 NOTE — Progress Notes (Signed)
PULMONARY  / CRITICAL CARE MEDICINE  Name: Gary Rivera MRN: [id number] DOB: 10/16/1960    ADMISSION DATE:  07/01/2012 CONSULTATION DATE:  07/01/12  REFERRING MD :  Dr. Juleen China PRIMARY SERVICE: PCCM  CHIEF COMPLAINT:  Acute respiratory failure  BRIEF PATIENT DESCRIPTION: Gary Rivera 52 yo AA man pmh of pulmonary HTN, systolic HF, ESRD on peritoneal dialysis, and OHS presented with abdominal pain after 8mg  of morphine developed acute respiratory failure with little improvement from narcan and refused bipap. pt was intubated and PCCM called to manage.   SIGNIFICANT EVENTS / STUDIES:  4/13 Adm with dx sepsis, resp source suspected 4/13 CT abd: slight bibasilar infiltrates, no dilated bowels or acute process 4/13 CXR: bibasilar infiltrates, L effusion, under inflated lungs 4/14 Vasopressors and CRRT initiated 4/15 Extubated.  LINES / TUBES: ETT 4/13 >>4/15 L IJ CVL 4/14 >> R IJ HD cath >>   CULTURES: 4/13 peritoneal fluid Cx >> GS neg >>  4/13 sputum Cx >> 4/13 BCx>>> 4/13 UCx>>>  ANTIBIOTICS: 4/13 rocephin >>>4/13 4/13 azithromax >>>4/13 4/13 IV Vanc >>> 4/13 IV Zosyn >>>  CXR: NSC  SUBJECTIVE:  RASS -1. + F/C  VITAL SIGNS: Temp:  [97.9 F (36.6 C)-98.6 F (37 C)] 98.6 F (37 C) (04/16 0400) Pulse Rate:  [76-103] 87 (04/16 1000) Resp:  [16-30] 25 (04/16 1000) BP: (80-157)/(46-89) 95/57 mmHg (04/16 1000) SpO2:  [86 %-99 %] 95 % (04/16 1000) Weight:  [143.473 kg (316 lb 4.8 oz)] 143.473 kg (316 lb 4.8 oz) (04/16 0500) HEMODYNAMICS:   VENTILATOR SETTINGS:   INTAKE / OUTPUT: Intake/Output     04/15 0701 - 04/16 0700 04/16 0701 - 04/17 0700   P.O. 120 240   I.V. (mL/kg) 184.8 (1.3)    NG/GT 60    IV Piggyback 212.6 22.5   Total Intake(mL/kg) 577.4 (4) 262.5 (1.8)   Other 214    Stool 2    Total Output 216     Net +361.4 +262.5         PHYSICAL EXAMINATION: General:  Sedated, RASS -1 Neuro:  sedated HEENT:  ETT, OG draining bilious material, no  JVD Cardiovascular:  RRR, no murmurs/rubs/gallops Lungs:  CTAB, no crackles/wheezes Abdomen:  Distended, peritoneal site non-erythematous/non-indurated, morbidly obese Musculoskeletal:  intact Skin:  Scaly, dry, warm, no LE edema  LABS:  Recent Labs Lab 07/01/12 1627  07/01/12 2045 07/01/12 2123 07/01/12 2235 07/02/12 0100 07/02/12 0200 07/02/12 0330 07/03/12 0358 07/03/12 1019 07/04/12 0445  HGB 9.9*  --   --   --   --   --  10.2*  --  8.6*  --  8.5*  WBC 8.3  --   --   --   --   --  11.5*  --  7.5  --  9.2  PLT 178  --   --   --   --   --  219  --  153  --  171  NA 134*  --   --   --   --   --  132*  --  136  --  139  K 3.1*  --   --   --   --   --  3.4*  --  3.0*  --  4.4  CL 90*  --   --   --   --   --  87*  --  96  --  101  CO2 29  --   --   --   --   --  23  --  26  --  27  GLUCOSE 105*  --   --   --   --   --  111*  --  103*  --  101*  BUN 63*  --   --   --   --   --  65*  --  47*  --  29*  CREATININE 17.74*  --   --   --   --   --  17.82*  --  12.57*  --  8.36*  CALCIUM 8.5  --   --   --   --   --  8.6  --  8.6  --  8.5  MG  --   --   --   --   --   --  1.9  --   --   --  2.5  PHOS  --   --   --   --   --   --   --   --  5.4*  --  5.7*  AST 30  --   --   --   --   --   --   --   --   --   --   ALT 33  --   --   --   --   --   --   --   --   --   --   ALKPHOS 58  --   --   --   --   --   --   --   --   --   --   BILITOT 0.3  --   --   --   --   --   --   --   --   --   --   PROT 7.6  --   --   --   --   --   --   --   --   --   --   ALBUMIN 2.6*  --   --   --   --   --   --   --  2.2*  --  2.3*  LATICACIDVEN  --   --   --   --   --   --   --  3.3*  --   --   --   TROPONINI  --   --   --  <0.30  --   --   --   --   --   --   --   PROCALCITON  --   --   --   --   --  5.85  --   --   --   --   --   PHART  --   < > 7.142*  --  7.380  --   --   --   --  7.345*  --   PCO2ART  --   < > 94.2*  --  46.3*  --   --   --   --  46.7*  --   PO2ART  --   < > 126.0*  --  57.0*  --    --   --   --  73.0*  --   < > = values in this interval not displayed.  Recent Labs Lab 07/02/12 0722 07/02/12 1146  GLUCAP 112* 89    ASSESSMENT / PLAN:  PULMONARY A: Acute on chronic hypercarbic respiratory failure  Suspect component  of possible pulm edema Probable PNA P:   - D/C BiPAP. - IS and flutter valve. - Titrate O2 for sats.  CARDIOVASCULAR A: systolic heart failure EF 40% last Echo 6/13 Shock - likely septic P:  - D/C pressors. - Patient usually runs low BP, will accept SBP of 80-90 as long as mentation is intact.  RENAL A:  ESRD, chronic peritoneal dialysis  P:   - D/C CRRT and start peritoneal dialysis. - Replace K in dilasate.  GASTROINTESTINAL A:  Presented with abd pain P:   - Renal diet.  HEMATOLOGIC A: No active issues P:  -Monitor CBC intermittently  INFECTIOUS A:  Probable PNA Severe sepsis P:   - Abx and micro as above  ENDOCRINE A:  No active issues P:   - Cont to monitor. - Initiate SSI for glu > 180.  NEUROLOGIC A:  sedated P:   - D/C sedation. - Klonipin which patient takes at home.  TODAY'S SUMMARY: Extubated, doing well, will transfer to renal floor and to [facility] service, PCCM will sign off, please call back if needed.  Alyson Reedy, M.D. [facility] Pulmonary/Critical Care Medicine. Pager: [phone]. After hours pager: [phone].

## 2012-07-04 NOTE — Progress Notes (Signed)
eLink Physician-Brief Progress Note Patient Name: Gary Rivera DOB: [birth year] MRN: [id number]  Date of Service  07/04/2012   HPI/Events of Note  Rn wanting pain meds   eICU Interventions  Fentanyl iv ordered    Intervention Category Intermediate Interventions: Pain - evaluation and management  Shan Levans 07/04/2012, 8:35 PM

## 2012-07-04 NOTE — Progress Notes (Signed)
NUTRITION FOLLOW UP  Intervention:   1. Would likely benefit from being on a Renal Diet 2. RD will continue to follow    Nutrition Dx:   Inadequate oral intake related to inability to eat as evidenced by intubated, NPO. Resolved  Goal:   EN goals no longer applicable.  New Goal: Meet >/=90% estimated nutrition needs.   Monitor:   PO intake, weight trends, labs   Assessment:   Pt was extubated on 4/15. SLP completed bedside swallow evaluation and pt was advanced to Renal 60/70 diet, has since been changed to Heart Healthy diet. Was on CVVHD while intubated. Now changing back over to PD per home regimen.  Pt reports good appetite today. Denies any nutrition problems PTA.    Height: Ht Readings from Last 1 Encounters:  07/02/12 6' (1.829 m)    Weight Status:   Wt Readings from Last 1 Encounters:  07/04/12 316 lb 4.8 oz (143.473 kg)  Weight is trending up from admission weight of 314 lbs   Re-estimated needs:  Kcal: 2700-2900 Protein: 150-160 gm  Fluid: per MD   Skin: intact   Diet Order: Cardiac   Intake/Output Summary (Last 24 hours) at 07/04/12 1100 Last data filed at 07/04/12 0900  Gross per 24 hour  Intake 632.33 ml  Output     48 ml  Net 584.33 ml    Last BM: 4/16   Labs:   Recent Labs Lab 07/02/12 0200 07/03/12 0358 07/04/12 0445  NA 132* 136 139  K 3.4* 3.0* 4.4  CL 87* 96 101  CO2 23 26 27   BUN 65* 47* 29*  CREATININE 17.82* 12.57* 8.36*  CALCIUM 8.6 8.6 8.5  MG 1.9  --  2.5  PHOS  --  5.4* 5.7*  GLUCOSE 111* 103* 101*    CBG (last 3)   Recent Labs  07/02/12 0722 07/02/12 1146  GLUCAP 112* 89    Scheduled Meds: . antiseptic oral rinse  15 mL Mouth Rinse QID  . aspirin  325 mg Per Tube Daily  . calcitRIOL  0.25 mcg Oral Custom  . lanthanum  1,000 mg Oral TID WC  . pantoprazole (PROTONIX) IV  40 mg Intravenous QHS  . piperacillin-tazobactam (ZOSYN)  IV  3.375 g Intravenous Q6H  . sodium chloride  3 mL Intravenous Q12H     Continuous Infusions: . norepinephrine (LEVOPHED) Adult infusion 3 mcg/min (07/03/12 0940)  . propofol 15 mcg/kg/min (07/03/12 0800)    Clarene Duke RD, LDN Pager [phone] After Hours pager [phone]

## 2012-07-04 NOTE — Evaluation (Addendum)
Physical Therapy Evaluation Patient Details Name: Gary Rivera MRN: [id number] DOB: 1961-01-31 Today's Date: 07/04/2012 Time: 1030-1101 PT Time Calculation (min): 31 min  PT Assessment / Plan / Recommendation Clinical Impression  52 y.o. male admitted to [facility] for sepsis, VDRF, PNA, ESRD.  He presents today generally deconditioned with increased WOB during gait on 4 L O2 [place].  Sat waveform on monitor was not the best, but I believe he dropped into the mid 80s on 4L O2 [place] with gait.  He rebounded <2 mins with seated rest breaks and pursed lip breathing.  Pt has an aid at home and I believe he will be able to return home with HHPT f/u at discharge.      PT Assessment  Patient needs continued PT services    Follow Up Recommendations  Home health PT    Does the patient have the potential to tolerate intense rehabilitation     NA  Barriers to Discharge None None    Equipment Recommendations  None recommended by PT    Recommendations for Other Services   none  Frequency Min 3X/week    Precautions / Restrictions Precautions Precautions: Fall;Other (comment) Precaution Comments: O2, fall- left knee buckles (this was there PTA as well) Required Braces or Orthoses: Other Brace/Splint Other Brace/Splint: pt uses bil knee braces at home.   Pertinent Vitals/Pain During gait DOE 3/4 with 4 L O2 [place] O2 sats dropped to mid 80s, rebounded into the 90s with seated rest break, PLB <2 mins.       Mobility  Bed Mobility Bed Mobility: Supine to Sit;Sitting - Scoot to Edge of Bed Supine to Sit: 6: Modified independent (Device/Increase time);With rails;HOB elevated Sitting - Scoot to Edge of Bed: 6: Modified independent (Device/Increase time);With rail Details for Bed Mobility Assistance: pt able to get to EOB on his own with heavy use of railing for support Transfers Transfers: Sit to Stand;Stand to Sit Sit to Stand: 3: Mod assist;With upper extremity assist;With armrests;From bed;From  chair/3-in-1 Stand to Sit: 3: Mod assist;With upper extremity assist;With armrests;To chair/3-in-1 Details for Transfer Assistance: mod assist to get to standing from bed and low chair.  Pt is 6'2" tall and needs a little support and momentum to get to standing.   Ambulation/Gait Ambulation/Gait Assistance: 4: Min assist Ambulation Distance (Feet): 150 Feet Assistive device: Rolling walker Ambulation/Gait Assistance Details: ambulated with RW 150 with one seated rest break due to DOE and lower O2 sats (mid 80s) on 4 L O2 [place].  Pt's left knee buckeled twice during gait, but he was able to catch himself with min assist and support of his upper extremities on RW.  Verbal cues for PLB during gait.   Gait Pattern: Step-through pattern;Trunk flexed    Exercises Other Exercises Other Exercises: encouraged ankle pumps, LAQs and seated marches.while sitting upright in the recliner chair.     PT Diagnosis: Difficulty walking;Abnormality of gait;Generalized weakness  PT Problem List: Decreased strength;Decreased activity tolerance;Decreased balance;Decreased mobility;Cardiopulmonary status limiting activity PT Treatment Interventions: DME instruction;Gait training;Stair training;Functional mobility training;Therapeutic activities;Therapeutic exercise;Balance training;Neuromuscular re-education;Patient/family education   PT Goals Acute Rehab PT Goals PT Goal Formulation: With patient Time For Goal Achievement: 07/18/12 Potential to Achieve Goals: Good Pt will go Supine/Side to Sit: with modified independence;with HOB 0 degrees PT Goal: Supine/Side to Sit - Progress: Goal set today Pt will go Sit to Supine/Side: with modified independence;with HOB 0 degrees PT Goal: Sit to Supine/Side - Progress: Goal set today Pt will go Sit to  Stand: with modified independence PT Goal: Sit to Stand - Progress: Goal set today Pt will go Stand to Sit: with modified independence PT Goal: Stand to Sit - Progress: Goal  set today Pt will Ambulate: >150 feet;with modified independence;with rolling walker PT Goal: Ambulate - Progress: Goal set today Pt will Go Up / Down Stairs: 3-5 stairs;with modified independence;with least restrictive assistive device PT Goal: Up/Down Stairs - Progress: Goal set today  Visit Information  Last PT Received On: 07/04/12 Assistance Needed: +2 (for lines and chair to follow)    Subjective Data  Subjective: Pt reports he wanted to do therapy before his peritoneal dialysis.   Patient Stated Goal: to be able to go home at discharge   Prior Functioning  Home Living Lives With: Alone Available Help at Discharge: Personal care attendant (CNA, daily 2.5 hours, 7 days/wk) Type of Home: House Home Access: Stairs to enter [employer] of Steps: 4 Entrance Stairs-Rails: Right;Left;Can reach both Home Layout: One level Bathroom Shower/Tub: Forensic scientist: Standard Bathroom Accessibility: Yes How Accessible: Accessible via walker Home Adaptive Equipment: Walker - rolling (home O2, leg braces) Additional Comments: has  bil knee braces hinged- wears them just when he knows he will really exert himself.   Prior Function Level of Independence: Needs assistance Needs Assistance: Meal Prep;Light Housekeeping (CNA helps with this) Able to Take Stairs?: Yes Driving: Yes Communication Communication: Other (comment) (raspy/horse voice due to intubation) Dominant Hand: Right    Cognition  Cognition Arousal/Alertness: Awake/alert Behavior During Therapy: WFL for tasks assessed/performed Overall Cognitive Status: Within Functional Limits for tasks assessed    Extremity/Trunk Assessment Right Lower Extremity Assessment RLE ROM/Strength/Tone: Deficits RLE ROM/Strength/Tone Deficits: at least 4/5 knee ext per gross MMT EOB.  Functionally generally weak throughout.   Left Lower Extremity Assessment LLE ROM/Strength/Tone: Deficits LLE  ROM/Strength/Tone Deficits: at least 4/5 knee ext per gross MMT EOB.  Functionally generally weak throughout.  Pt's left knee buckled twice with gait.  Pt reports this is not new for him. He was able to catch himself with upper extremitites on RW and min assist.  I would say overall L leg is weaker than right       End of Session PT - End of Session Equipment Utilized During Treatment: Gait belt;Oxygen Activity Tolerance: Patient limited by fatigue;Other (comment) (limited by SOB/DOE) Patient left: in chair;with call bell/phone within reach Nurse Communication: Mobility status;Other (comment) (O2 sats)    Lurena Joiner B. [name], PT, DPT [phone]   07/04/2012, 2:12 PM

## 2012-07-04 NOTE — Progress Notes (Signed)
CRRT stopped and discontinued due to clotting. Dr. Hyman Hopes made aware and order discontinuation. Instilled each port with 1.2cc heparin per protocol via sterile procedure.

## 2012-07-04 NOTE — Progress Notes (Signed)
ANTIBIOTIC CONSULT NOTE - FOLLOW UP  Pharmacy Consult for Vancomycin and Zosyn Indication: R/O PNA, Peritonitis  Allergies  Allergen Reactions  . Renvela (Sevelamer) Nausea And Vomiting    Patient Measurements: Height: 6' (182.9 cm) Weight: 316 lb 4.8 oz (143.473 kg) IBW/kg (Calculated) : 77.6 Adjusted Body Weight:   Vital Signs: Temp: 98.6 F (37 C) (04/16 0400) BP: 95/57 mmHg (04/16 1000) Pulse Rate: 90 (04/16 1031) Intake/Output from previous day: 04/15 0701 - 04/16 0700 In: 577.4 [P.O.:120; I.V.:184.8; NG/GT:60; IV Piggyback:212.6] Out: 216 [Stool:2] Intake/Output from this shift: Total I/O In: 262.5 [P.O.:240; IV Piggyback:22.5] Out: -   Labs:  Recent Labs  07/02/12 0200 07/03/12 0358 07/04/12 0445  WBC 11.5* 7.5 9.2  HGB 10.2* 8.6* 8.5*  PLT 219 153 171  CREATININE 17.82* 12.57* 8.36*   Estimated Creatinine Clearance: 15.2 ml/min (by C-G formula based on Cr of 8.36).  Recent Labs  07/03/12 0358 07/04/12 0445  VANCORANDOM 41.0 25.2     Microbiology: Recent Results (from the past 720 hour(s))  BODY FLUID CULTURE     Status: None   Collection Time    06/19/12  8:22 PM      Result Value Range Status   Specimen Description PERITONEAL FLUID   Final   Special Requests NONE   Final   Gram Stain     Final   Value: NO WBC SEEN     NO ORGANISMS SEEN   Culture NO GROWTH 3 DAYS   Final   Report Status 06/23/2012 FINAL   Final  MRSA PCR SCREENING     Status: None   Collection Time    07/02/12 12:17 AM      Result Value Range Status   MRSA by PCR NEGATIVE  NEGATIVE Final   Comment:            The GeneXpert MRSA Assay (FDA     approved for NASAL specimens     only), is one component of a     comprehensive MRSA colonization     surveillance program. It is not     intended to diagnose MRSA     infection nor to guide or     monitor treatment for     MRSA infections.  CULTURE, BLOOD (ROUTINE X 2)     Status: None   Collection Time    07/02/12  3:30  AM      Result Value Range Status   Specimen Description BLOOD LEFT HAND   Final   Special Requests BOTTLES DRAWN AEROBIC ONLY 6CC   Final   Culture  Setup Time 07/02/2012 07:34   Final   Culture     Final   Value:        BLOOD CULTURE RECEIVED NO GROWTH TO DATE CULTURE WILL BE HELD FOR 5 DAYS BEFORE ISSUING A FINAL NEGATIVE REPORT   Report Status PENDING   Incomplete  CULTURE, BLOOD (ROUTINE X 2)     Status: None   Collection Time    07/02/12  3:45 AM      Result Value Range Status   Specimen Description BLOOD LEFT HAND   Final   Special Requests BOTTLES DRAWN AEROBIC ONLY 3CC   Final   Culture  Setup Time 07/02/2012 07:34   Final   Culture     Final   Value:        BLOOD CULTURE RECEIVED NO GROWTH TO DATE CULTURE WILL BE HELD FOR 5 DAYS BEFORE ISSUING A FINAL NEGATIVE  REPORT   Report Status PENDING   Incomplete  BODY FLUID CULTURE     Status: None   Collection Time    07/02/12  5:57 AM      Result Value Range Status   Specimen Description FLUID PERITONEAL   Final   Special Requests Normal   Final   Gram Stain     Final   Value: ABUNDANT WBC PRESENT,BOTH PMN AND MONONUCLEAR     NO ORGANISMS SEEN   Culture NO GROWTH 1 DAY   Final   Report Status PENDING   Incomplete    Anti-infectives   Start     Dose/Rate Route Frequency Ordered Stop   07/02/12 2200  piperacillin-tazobactam (ZOSYN) IVPB 3.375 g     3.375 g 12.5 mL/hr over 240 Minutes Intravenous Every 6 hours 07/02/12 1703     07/02/12 2000  vancomycin (VANCOCIN) 1,500 mg in sodium chloride 0.9 % 500 mL IVPB  Status:  Discontinued     1,500 mg 250 mL/hr over 120 Minutes Intravenous Every 24 hours 07/02/12 1708 07/03/12 1245   07/02/12 0800  piperacillin-tazobactam (ZOSYN) IVPB 2.25 g  Status:  Discontinued     2.25 g 100 mL/hr over 30 Minutes Intravenous Every 8 hours 07/02/12 0054 07/02/12 1703   07/02/12 0100  vancomycin (VANCOCIN) 2,500 mg in sodium chloride 0.9 % 500 mL IVPB     2,500 mg 250 mL/hr over 120 Minutes  Intravenous Every 12 hours 07/02/12 0054 07/02/12 0409   07/02/12 0100  piperacillin-tazobactam (ZOSYN) IVPB 2.25 g     2.25 g 100 mL/hr over 30 Minutes Intravenous  Once 07/02/12 0054 07/02/12 0519   07/01/12 2245  cefTRIAXone (ROCEPHIN) 1 g in dextrose 5 % 50 mL IVPB     1 g 100 mL/hr over 30 Minutes Intravenous  Once 07/01/12 2236 07/01/12 2336   07/01/12 2245  azithromycin (ZITHROMAX) 500 mg in dextrose 5 % 250 mL IVPB  Status:  Discontinued     500 mg 250 mL/hr over 60 Minutes Intravenous  Once 07/01/12 2236 07/02/12 0218      Assessment: 51yom on Vancomycin and Zosyn Day 3 for possible PNA and peritonitis. Patient has been receiving CRRT since 4/14 but has now been discontinued due to clotting. Noted Renal plans to restart PD today. Vancomycin random level (25.2) was just above goal range and CRRT continued for just ~3 hours after level. With plan to restart PD, will plan to recheck Vancomycin level in AM and redose if <20 mcg/ml. - Afebrile, WBC wnl - Cultures: NGTD  Goal of Therapy:  Vancomycin trough level 15-20 mcg/ml  Plan:  1. Vancomycin random level with AM labs (4/17) - redose Vancomycin once level <20 mcg/ml 2. Change Zosyn to 2.25g IV q8h 3. Follow-up renal plans, cultures and LOT  Cleon Dew [phone] 07/04/2012,11:09 AM

## 2012-07-05 ENCOUNTER — Inpatient Hospital Stay (HOSPITAL_COMMUNITY): Payer: Medicare Other

## 2012-07-05 DIAGNOSIS — R109 Unspecified abdominal pain: Secondary | ICD-10-CM

## 2012-07-05 DIAGNOSIS — I10 Essential (primary) hypertension: Secondary | ICD-10-CM

## 2012-07-05 LAB — POCT I-STAT 3, ART BLOOD GAS (G3+)
Acid-base deficit: 1 mmol/L (ref 0.0–2.0)
Acid-base deficit: 2 mmol/L (ref 0.0–2.0)
O2 Saturation: 98 %
Patient temperature: 98.6
Patient temperature: 98.6
pH, Arterial: 7.249 — ABNORMAL LOW (ref 7.350–7.450)
pO2, Arterial: 122 mmHg — ABNORMAL HIGH (ref 80.0–100.0)

## 2012-07-05 LAB — MAGNESIUM: Magnesium: 2.3 mg/dL (ref 1.5–2.5)

## 2012-07-05 LAB — CBC
MCV: 95.8 fL (ref 78.0–100.0)
Platelets: 172 10*3/uL (ref 150–400)
RBC: 2.63 MIL/uL — ABNORMAL LOW (ref 4.22–5.81)
WBC: 17 10*3/uL — ABNORMAL HIGH (ref 4.0–10.5)

## 2012-07-05 LAB — PHOSPHORUS: Phosphorus: 5.5 mg/dL — ABNORMAL HIGH (ref 2.3–4.6)

## 2012-07-05 LAB — BASIC METABOLIC PANEL
CO2: 25 mEq/L (ref 19–32)
Chloride: 99 mEq/L (ref 96–112)
Sodium: 137 mEq/L (ref 135–145)

## 2012-07-05 MED ORDER — NEPRO/CARBSTEADY PO LIQD
237.0000 mL | ORAL | Status: DC | PRN
  Filled 2012-07-05: qty 237

## 2012-07-05 MED ORDER — DELFLEX-LC/2.5% DEXTROSE 394 MOSM/L IP SOLN: Freq: Once | INTRAPERITONEAL | Status: DC

## 2012-07-05 MED ORDER — DELFLEX-LM/2.5% DEXTROSE 396 MOSM/L IP SOLN: Freq: Once | INTRAPERITONEAL | Status: DC

## 2012-07-05 MED ORDER — HEPARIN SODIUM (PORCINE) 1000 UNIT/ML DIALYSIS: 3000.0000 [IU] | INTRAMUSCULAR | Status: DC | PRN

## 2012-07-05 MED ORDER — DELFLEX-LM/1.5% DEXTROSE 346 MOSM/L IP SOLN
Freq: Once | INTRAPERITONEAL | Status: AC
  Administered 2012-07-05: 13:00:00 via INTRAPERITONEAL

## 2012-07-05 MED ORDER — SODIUM CHLORIDE 0.9 % IV SOLN: 100.0000 mL | INTRAVENOUS | Status: DC | PRN

## 2012-07-05 MED ORDER — VANCOMYCIN HCL IN DEXTROSE 750-5 MG/150ML-% IV SOLN
750.0000 mg | Freq: Once | INTRAVENOUS | Status: DC
  Filled 2012-07-05: qty 150

## 2012-07-05 MED ORDER — PENTAFLUOROPROP-TETRAFLUOROETH EX AERO: 1.0000 "application " | INHALATION_SPRAY | CUTANEOUS | Status: DC | PRN

## 2012-07-05 MED ORDER — LIDOCAINE HCL (PF) 1 % IJ SOLN: 5.0000 mL | INTRAMUSCULAR | Status: DC | PRN

## 2012-07-05 MED ORDER — VANCOMYCIN HCL IN DEXTROSE 750-5 MG/150ML-% IV SOLN
750.0000 mg | Freq: Once | INTRAVENOUS | Status: AC
  Administered 2012-07-05: 750 mg via INTRAVENOUS
  Filled 2012-07-05: qty 150

## 2012-07-05 MED ORDER — HEPARIN SODIUM (PORCINE) 1000 UNIT/ML IJ SOLN
1500.0000 [IU] | Freq: Once | INTRAMUSCULAR | Status: AC
  Administered 2012-07-05: 1500 [IU] via INTRAPERITONEAL
  Filled 2012-07-05: qty 1.5

## 2012-07-05 MED ORDER — LIDOCAINE-PRILOCAINE 2.5-2.5 % EX CREA
1.0000 | TOPICAL_CREAM | CUTANEOUS | Status: DC | PRN
Start: 2012-07-05 — End: 2012-07-06

## 2012-07-05 MED ORDER — OXYCODONE-ACETAMINOPHEN 5-325 MG PO TABS
1.0000 | ORAL_TABLET | Freq: Once | ORAL | Status: AC
  Administered 2012-07-05: 1 via ORAL
  Filled 2012-07-05: qty 1

## 2012-07-05 MED ORDER — ALTEPLASE 2 MG IJ SOLR: 2.0000 mg | Freq: Once | INTRAMUSCULAR | Status: AC | PRN

## 2012-07-05 MED ORDER — HEPARIN SODIUM (PORCINE) 1000 UNIT/ML DIALYSIS: 1000.0000 [IU] | INTRAMUSCULAR | Status: DC | PRN

## 2012-07-05 NOTE — Progress Notes (Signed)
[facility] P[facility]  Mr. Crill mental status has improved greatly, though he remains tachypnic.  He says he is not short of breath, just has belly pain making him uncomfortable.  Plan: -cont bipap -percocet x1 with careful monitoring -repeat ABG in AM  Max Fickle [place] [facility] Pager: [phone] Cell: [phone] If no response, call [phone]

## 2012-07-05 NOTE — Progress Notes (Signed)
[facility] PROGRESS NOTE  Gary Rivera OZH:[id number] DOB: 09-12-1960 DOA: 07/01/2012 PCP: [name], MD  Assessment/Plan: 1. Acute respiratory failure with hypercapnia 2. CKD on peritoneal dialysis 3. Pulmonary HTN 4. Hypertension 5. 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 Leukocytosis  1. Bipap prn q 4 hours on and q 4 hours off as tolerated.  PCCM on board. Patient may be fluid overloaded and plans are in motion for dialysis for today 4/17 2. CKD: Nephrology on board and will defer dialysis recommendations to them.  Will f/u with their evaluation and recommendations. 3. Pulmonary HTN: per PCCM 4. HTN: At this point patient's blood pressures have been on the soft side.  Will hold blood pressure medication and continue to monitor and blood pressures 5. Systolic CHF: Blood pressures too soft for B blocker.  At this juncture patient is to get dialysis and extra fluid will try to be taken off.  Will reassess next am. Strict I and O's. Daily weights. 6. Thought to be 2ary to peritonitis. Agree with Vanc and Zosyn at this juncture.  Will continue to monitor. Patient still spiking fevers. Blood cultures negative  Code Status: full Family Communication: no family at bedside.  Disposition Plan: Pending further improvement in condition   Consultants:  PCCM  Nephrology  Antibiotics:  Vanc and Zosyn  HPI/Subjective: Patient has no new complaints.  Reports coming to hospital due to abdominal discomfort and SOB. Feels somewhat improved.   Objective: Filed Vitals:   07/05/12 1605 07/05/12 1615 07/05/12 1620 07/05/12 1625  BP: 97/52 62/34 68/35  74/36  Pulse: 89 87 86   Temp:      TempSrc:      Resp: 24 27 26    Height:      Weight:      SpO2: 100% 100% 100%     Intake/Output Summary (Last 24 hours) at 07/05/12 1628 Last data filed at 07/05/12 1100  Gross per 24 hour  Intake    540 ml  Output      0 ml  Net    540 ml   Filed  Weights   07/04/12 2120 07/05/12 0500 07/05/12 1407  Weight: 146.6 kg (323 lb 3.1 oz) 144.5 kg (318 lb 9 oz) 143.9 kg (317 lb 3.9 oz)    Exam:   General:  Pt in NAD, Alert and Awake  Cardiovascular: RRR, No murmurs  Respiratory: increased work of breathing. Mild expiratory wheezes  Abdomen: soft, NT, ND  Musculoskeletal: no clubbing   Data Reviewed: Basic Metabolic Panel:  Recent Labs Lab 07/02/12 0200 07/03/12 0358 07/04/12 0445 07/04/12 2300 07/05/12 0420  NA 132* 136 139 137 137  K 3.4* 3.0* 4.4 4.1 4.5  CL 87* 96 101 99 99  CO2 23 26 27 25 25   GLUCOSE 111* 103* 101* 110* 121*  BUN 65* 47* 29* 34* 38*  CREATININE 17.82* 12.57* 8.36* 10.20* 11.03*  CALCIUM 8.6 8.6 8.5 8.5 8.7  MG 1.9  --  2.5  --  2.3  PHOS  --  5.4* 5.7*  --  5.5*   Liver Function Tests:  Recent Labs Lab 07/01/12 1627 07/03/12 0358 07/04/12 0445  AST 30  --   --   ALT 33  --   --   ALKPHOS 58  --   --   BILITOT 0.3  --   --   PROT 7.6  --   --   ALBUMIN 2.6* 2.2* 2.3*    Recent Labs Lab 07/01/12  1627  LIPASE 41   No results found for this basename: AMMONIA,  in the last 168 hours CBC:  Recent Labs Lab 07/01/12 1627 07/02/12 0200 07/03/12 0358 07/04/12 0445 07/04/12 2300 07/05/12 0420  WBC 8.3 11.5* 7.5 9.2 12.0* 17.0*  NEUTROABS 6.3  --   --   --   --   --   HGB 9.9* 10.2* 8.6* 8.5* 8.5* 8.0*  HCT 30.3* 31.3* 26.3* 28.0* 27.4* 25.2*  MCV 93.2 93.7 91.6 96.6 96.1 95.8  PLT 178 219 153 171 218 172   Cardiac Enzymes:  Recent Labs Lab 07/01/12 2123 07/04/12 2300  TROPONINI <0.30 <0.30   BNP (last 3 results)  Recent Labs  09/07/11 2000 06/19/12 1927  PROBNP 24858.0* 3703.0*   CBG:  Recent Labs Lab 07/02/12 0722 07/02/12 1146 07/04/12 2305  GLUCAP 112* 89 98    Recent Results (from the past 240 hour(s))  MRSA PCR SCREENING     Status: None   Collection Time    07/02/12 12:17 AM      Result Value Range Status   MRSA by PCR NEGATIVE  NEGATIVE Final    Comment:            The GeneXpert MRSA Assay (FDA     approved for NASAL specimens     only), is one component of a     comprehensive MRSA colonization     surveillance program. It is not     intended to diagnose MRSA     infection nor to guide or     monitor treatment for     MRSA infections.  CULTURE, BLOOD (ROUTINE X 2)     Status: None   Collection Time    07/02/12  3:30 AM      Result Value Range Status   Specimen Description BLOOD LEFT HAND   Final   Special Requests BOTTLES DRAWN AEROBIC ONLY 6CC   Final   Culture  Setup Time 07/02/2012 07:34   Final   Culture     Final   Value:        BLOOD CULTURE RECEIVED NO GROWTH TO DATE CULTURE WILL BE HELD FOR 5 DAYS BEFORE ISSUING A FINAL NEGATIVE REPORT   Report Status PENDING   Incomplete  CULTURE, BLOOD (ROUTINE X 2)     Status: None   Collection Time    07/02/12  3:45 AM      Result Value Range Status   Specimen Description BLOOD LEFT HAND   Final   Special Requests BOTTLES DRAWN AEROBIC ONLY 3CC   Final   Culture  Setup Time 07/02/2012 07:34   Final   Culture     Final   Value:        BLOOD CULTURE RECEIVED NO GROWTH TO DATE CULTURE WILL BE HELD FOR 5 DAYS BEFORE ISSUING A FINAL NEGATIVE REPORT   Report Status PENDING   Incomplete  BODY FLUID CULTURE     Status: None   Collection Time    07/02/12  5:57 AM      Result Value Range Status   Specimen Description FLUID PERITONEAL   Final   Special Requests Normal   Final   Gram Stain     Final   Value: ABUNDANT WBC PRESENT,BOTH PMN AND MONONUCLEAR     NO ORGANISMS SEEN   Culture NO GROWTH 1 DAY   Final   Report Status PENDING   Incomplete     Studies: Dg Abd 1 View  07/05/2012  *RADIOLOGY REPORT*  Clinical Data: Peritoneal dialysis catheter not draining.  Evaluate for position of catheter.  Question constipation.  ABDOMEN - 1 VIEW  Comparison: CT of 07/01/2012  Findings: Multiple supine views, performed portably.  These are moderate to markedly degraded by patient body  habitus.  Gas within normal caliber stomach.  Increased density within the right side abdomen is likely due to contrast within the ascending colon from prior CT.  A large bore catheter projects over the midabdomen.  This could represent the external course of the dialysis catheter.  The remainder of the catheter is not confidently identified.  There is a moderate amount of colonic stool.  The low pelvis is excluded.  Images are motion degraded.  IMPRESSION: Moderately degraded exam, secondary multiple factors detailed above.  Lack of confident visualization of the dialysis catheter.  Its external course may be identified, terminating over the left paracentral abdomen.  Moderate amount of ascending colonic stool.  Low pelvis excluded.   Original Report Authenticated By: Jeronimo Greaves, M.D.    Dg Chest Port 1 View  07/05/2012  *RADIOLOGY REPORT*  Clinical Data: Increased respiratory rate.  Question volume overload.  PORTABLE CHEST - 1 VIEW  Comparison: Plain film chest 07/04/2012.  Findings: Bilateral internal jugular central venous catheters are unchanged.  Lung volumes are low with basilar atelectasis, also unchanged.  No pulmonary edema is identified.  There is cardiomegaly.  IMPRESSION:  No acute abnormality with bibasilar atelectasis and cardiomegaly again seen.   Original Report Authenticated By: Holley Dexter, M.D.    Dg Chest Port 1 View  07/04/2012  *RADIOLOGY REPORT*  Clinical Data: Acute dyspnea.  PORTABLE CHEST - 1 VIEW  Comparison: 07/03/2012 and 07/02/2012  Findings: Central catheters are unchanged.  NG tube and endotracheal tube have been removed.  Chronic cardiomegaly. Pulmonary vascularity is within normal limits considering the shallow inspiration.  Slight atelectasis at the bases is essentially unchanged.  IMPRESSION: No acute abnormalities.  Slight atelectasis, stable.   Original Report Authenticated By: Francene Boyers, M.D.     Scheduled Meds: . antiseptic oral rinse  15 mL Mouth Rinse  QID  . aspirin  325 mg Per Tube Daily  . calcitRIOL  0.25 mcg Oral Custom  . dialysis solution for CAPD/CCPD [facility])   Peritoneal Dialysis Once in dialysis  . dialysis solution 2.5% low-MG/low-CA   Intraperitoneal Once in dialysis  . lanthanum  1,000 mg Oral TID WC  . pantoprazole (PROTONIX) IV  40 mg Intravenous QHS  . piperacillin-tazobactam (ZOSYN)  IV  2.25 g Intravenous Q8H  . sodium chloride  3 mL Intravenous Q12H  . vancomycin  750 mg Intravenous Once   Continuous Infusions: . dialysis solution 1.5% low-MG/low-CA    . dialysis solution 2.5% low-MG/low-CA      Principal Problem:   Acute respiratory failure with hypercapnia Active Problems:   Systolic congestive heart failure with reduced left ventricular function, NYHA class 1- EF 40-45% 2011 ECHO   CKD (chronic kidney disease) requiring chronic PERITONEAL dialysis   HTN (hypertension)   Pulmonary HTN- 50 mmHg 2011 ECHO   Hypercapnia   Pulmonary edema   Obesity hypoventilation syndrome   Septic shock(785.52)    Time spent: > 35 minutes    Penny Pia  [facility] Pager [phone]. If 7PM-7AM, please contact night-coverage at [email or web address], password [facility] 07/05/2012, 4:28 PM  LOS: 4 days

## 2012-07-05 NOTE — Progress Notes (Signed)
07/05/2012 1:12 PM  Attempted initial drain while performing flush.  About 10cc of pink tinged fluid with fibrin noted to drain from peritoneal cavity.  I put about 500cc of dialysate in at once and attempted immediate drain, no drainage noted to return.  Instilled another 500cc of dialysate to total 1000cc per order.  Will let this sit in patient's abdomen for a few hours and then attempt to drain patient again.  This dialysate has heparin in it for the fibrin. Dr. Arlean Hopping aware.  Pt tolerated procedure well.  Will follow up this afternoon to assess drain.  Eunice Blase

## 2012-07-05 NOTE — Progress Notes (Signed)
ANTIBIOTIC CONSULT NOTE - FOLLOW UP  Pharmacy Consult for Vancomycin and Zosyn Indication: R/O PNA, Peritonitis  Allergies  Allergen Reactions  . Renvela (Sevelamer) Nausea And Vomiting    Patient Measurements: Height: 6' (182.9 cm) Weight: 318 lb 9 oz (144.5 kg) IBW/kg (Calculated) : 77.6 Adjusted Body Weight:   Vital Signs: Temp: 100.1 F (37.8 C) (04/17 1300) Temp src: Axillary (04/17 1300) BP: 113/54 mmHg (04/17 1300) Pulse Rate: 97 (04/17 1300) Intake/Output from previous day: 04/16 0701 - 04/17 0700 In: 932.5 [P.O.:720; I.V.:90; IV Piggyback:122.5] Out: -  Intake/Output from this shift: Total I/O In: 160 [P.O.:150; I.V.:10] Out: -   Labs:  Recent Labs  07/04/12 0445 07/04/12 2300 07/05/12 0420  WBC 9.2 12.0* 17.0*  HGB 8.5* 8.5* 8.0*  PLT 171 218 172  CREATININE 8.36* 10.20* 11.03*   Estimated Creatinine Clearance: 11.6 ml/min (by C-G formula based on Cr of 11.03).  Recent Labs  07/04/12 0445 07/05/12 0424  VANCORANDOM 25.2 26.0     Microbiology: Recent Results (from the past 720 hour(s))  BODY FLUID CULTURE     Status: None   Collection Time    06/19/12  8:22 PM      Result Value Range Status   Specimen Description PERITONEAL FLUID   Final   Special Requests NONE   Final   Gram Stain     Final   Value: NO WBC SEEN     NO ORGANISMS SEEN   Culture NO GROWTH 3 DAYS   Final   Report Status 06/23/2012 FINAL   Final  MRSA PCR SCREENING     Status: None   Collection Time    07/02/12 12:17 AM      Result Value Range Status   MRSA by PCR NEGATIVE  NEGATIVE Final   Comment:            The GeneXpert MRSA Assay (FDA     approved for NASAL specimens     only), is one component of a     comprehensive MRSA colonization     surveillance program. It is not     intended to diagnose MRSA     infection nor to guide or     monitor treatment for     MRSA infections.  CULTURE, BLOOD (ROUTINE X 2)     Status: None   Collection Time    07/02/12  3:30 AM       Result Value Range Status   Specimen Description BLOOD LEFT HAND   Final   Special Requests BOTTLES DRAWN AEROBIC ONLY 6CC   Final   Culture  Setup Time 07/02/2012 07:34   Final   Culture     Final   Value:        BLOOD CULTURE RECEIVED NO GROWTH TO DATE CULTURE WILL BE HELD FOR 5 DAYS BEFORE ISSUING A FINAL NEGATIVE REPORT   Report Status PENDING   Incomplete  CULTURE, BLOOD (ROUTINE X 2)     Status: None   Collection Time    07/02/12  3:45 AM      Result Value Range Status   Specimen Description BLOOD LEFT HAND   Final   Special Requests BOTTLES DRAWN AEROBIC ONLY 3CC   Final   Culture  Setup Time 07/02/2012 07:34   Final   Culture     Final   Value:        BLOOD CULTURE RECEIVED NO GROWTH TO DATE CULTURE WILL BE HELD FOR 5 DAYS BEFORE ISSUING  A FINAL NEGATIVE REPORT   Report Status PENDING   Incomplete  BODY FLUID CULTURE     Status: None   Collection Time    07/02/12  5:57 AM      Result Value Range Status   Specimen Description FLUID PERITONEAL   Final   Special Requests Normal   Final   Gram Stain     Final   Value: ABUNDANT WBC PRESENT,BOTH PMN AND MONONUCLEAR     NO ORGANISMS SEEN   Culture NO GROWTH 1 DAY   Final   Report Status PENDING   Incomplete    Anti-infectives   Start     Dose/Rate Route Frequency Ordered Stop   07/04/12 2100  piperacillin-tazobactam (ZOSYN) IVPB 2.25 g     2.25 g 100 mL/hr over 30 Minutes Intravenous 3 times per day 07/04/12 1120     07/02/12 2200  piperacillin-tazobactam (ZOSYN) IVPB 3.375 g  Status:  Discontinued     3.375 g 12.5 mL/hr over 240 Minutes Intravenous Every 6 hours 07/02/12 1703 07/04/12 1120   07/02/12 2000  vancomycin (VANCOCIN) 1,500 mg in sodium chloride 0.9 % 500 mL IVPB  Status:  Discontinued     1,500 mg 250 mL/hr over 120 Minutes Intravenous Every 24 hours 07/02/12 1708 07/03/12 1245   07/02/12 0800  piperacillin-tazobactam (ZOSYN) IVPB 2.25 g  Status:  Discontinued     2.25 g 100 mL/hr over 30 Minutes  Intravenous Every 8 hours 07/02/12 0054 07/02/12 1703   07/02/12 0100  vancomycin (VANCOCIN) 2,500 mg in sodium chloride 0.9 % 500 mL IVPB     2,500 mg 250 mL/hr over 120 Minutes Intravenous Every 12 hours 07/02/12 0054 07/02/12 0409   07/02/12 0100  piperacillin-tazobactam (ZOSYN) IVPB 2.25 g     2.25 g 100 mL/hr over 30 Minutes Intravenous  Once 07/02/12 0054 07/02/12 0519   07/01/12 2245  cefTRIAXone (ROCEPHIN) 1 g in dextrose 5 % 50 mL IVPB     1 g 100 mL/hr over 30 Minutes Intravenous  Once 07/01/12 2236 07/01/12 2336   07/01/12 2245  azithromycin (ZITHROMAX) 500 mg in dextrose 5 % 250 mL IVPB  Status:  Discontinued     500 mg 250 mL/hr over 60 Minutes Intravenous  Once 07/01/12 2236 07/02/12 0218      Assessment: 51yom on Vancomycin and Zosyn Day 4 for possible PNA and peritonitis. CRRT was stopped 4/16 and CAPD was attempted but unsuccessful due to draining complications. Per Renal note, patient will receive hemodialysis tonight. Vancomycin random level this AM was 26 - will order dose to be given after successful HD session. Patient has started spiking fevers (Tmax 101.2) and WBC has increased - follow-up for plan (removal of catheters, adjusting of antibiotics?). - Weight: 140kg  Goal of Therapy:  Pre-HD Vancomycin level: 15-25 mcg/ml  Plan:  1. Vancomycin 750mg  IV x 1 after HD this evening 2. Continue Zosyn 2.25g IV q8h 3. Follow-up antibiotic plan, renal plan and clinical status  Cleon Dew [phone] 07/05/2012,1:46 PM

## 2012-07-05 NOTE — Progress Notes (Signed)
Pt on hemodialysis, Noted to have low blood pressure.  Tried changing location of cuff and found best reading off Right forearm.  Pt was asymptomatic and talking while blood pressures were low.  BIPAP restarted at beginning of hemodialysis due to low saturations.

## 2012-07-05 NOTE — Progress Notes (Signed)
PULMONARY  / CRITICAL CARE MEDICINE  Name: Gary Rivera MRN: [id number] DOB: [birth year]    ADMISSION DATE:  07/01/2012 CONSULTATION DATE:  07/01/12  REFERRING MD :  Dr. Juleen China PRIMARY SERVICE: PCCM  CHIEF COMPLAINT:  Acute respiratory failure  BRIEF PATIENT DESCRIPTION: Gary Rivera 52 yo AA man pmh of pulmonary HTN, systolic HF, ESRD on peritoneal dialysis, and OHS presented with abdominal pain after 8mg  of morphine developed acute respiratory failure with little improvement from narcan and refused bipap. pt was intubated and PCCM called to manage.   SIGNIFICANT EVENTS / STUDIES:  4/13 Adm with dx sepsis, resp source suspected 4/13 CT abd: slight bibasilar infiltrates, no dilated bowels or acute process 4/13 CXR: bibasilar infiltrates, L effusion, under inflated lungs 4/14 Vasopressors and CRRT initiated 4/15 Extubated.  LINES / TUBES: ETT 4/13 >>4/15 L IJ CVL 4/14 >>  R IJ HD cath >>   CULTURES: 4/13 peritoneal fluid Cx >> ngtd 4/17 >>  4/13 BCx>> ngtd >>    ANTIBIOTICS: 4/13 rocephin >>>4/13 4/13 azithromax >>>4/13 4/13 IV Vanc >>> 4/13 IV Zosyn >>>   SUBJECTIVE:  RASS 0. Tolerates off NPPV. Asks to eat  VITAL SIGNS: Temp:  [98.1 F (36.7 C)-101.2 F (38.4 C)] 98.1 F (36.7 C) (04/17 2000) Pulse Rate:  [77-114] 96 (04/17 2100) Resp:  [20-47] 24 (04/17 2100) BP: (70-116)/(35-90) 102/57 mmHg (04/17 2100) SpO2:  [91 %-100 %] 93 % (04/17 2100) FiO2 (%):  [60 %-80 %] 70 % (04/17 1800) Weight:  [143.9 kg (317 lb 3.9 oz)-144.5 kg (318 lb 9 oz)] 143.9 kg (317 lb 3.9 oz) (04/17 1407) HEMODYNAMICS:   VENTILATOR SETTINGS: Vent Mode:  [-] BIPAP FiO2 (%):  [60 %-80 %] 70 % Set Rate:  [8 bmp] 8 bmp Plateau Pressure:  [15 cmH20] 15 cmH20 INTAKE / OUTPUT: Intake/Output     04/17 0701 - 04/18 0700   P.O. 390   I.V. (mL/kg) 13 (0.1)   IV Piggyback    Total Intake(mL/kg) 403 (2.8)   Other 1159   Total Output 1159   Net -756        PHYSICAL EXAMINATION: General:  NAD Neuro:  No focal deficits HEENT: WNL Cardiovascular:  RRR s M Lungs:  CTA anteriorly Abdomen:  Distended, peritoneal site non-erythematous/non-indurated, morbidly obese Ext:  no edema  LABS: BMET    Component Value Date/Time   NA 137 07/05/2012 0420   K 4.5 07/05/2012 0420   CL 99 07/05/2012 0420   CO2 25 07/05/2012 0420   GLUCOSE 121* 07/05/2012 0420   BUN 38* 07/05/2012 0420   CREATININE 11.03* 07/05/2012 0420   CALCIUM 8.7 07/05/2012 0420   CALCIUM 8.0* 12/04/2009 0455   GFRNONAA 5* 07/05/2012 0420   GFRAA 5* 07/05/2012 0420    CBC    Component Value Date/Time   WBC 17.0* 07/05/2012 0420   RBC 2.63* 07/05/2012 0420   HGB 8.0* 07/05/2012 0420   HCT 25.2* 07/05/2012 0420   PLT 172 07/05/2012 0420   MCV 95.8 07/05/2012 0420   MCH 30.4 07/05/2012 0420   MCHC 31.7 07/05/2012 0420   RDW 16.0* 07/05/2012 0420   LYMPHSABS 1.1 07/01/2012 1627   MONOABS 0.6 07/01/2012 1627   EOSABS 0.2 07/01/2012 1627   BASOSABS 0.0 07/01/2012 1627      Recent Labs Lab 07/02/12 0722 07/02/12 1146 07/04/12 2305  GLUCAP 112* 89 98    ASSESSMENT / PLAN:  PULMONARY A: Acute on chronic hypercarbic respiratory failure  Suspect component of possible pulm edema  Probable PNA P:   - D/C BiPAP. - IS and flutter valve. - Titrate O2 for sats.  CARDIOVASCULAR A: systolic heart failure EF 40% last Echo 6/13 Shock - likely septic, resolved P:  - monitor off pressors. - Patient usually runs low BP, will accept SBP of 80-90 as long as mentation is intact.  RENAL A:  ESRD, chronic peritoneal dialysis  P:   - mgmt per Renal - Will leave HD cath in place until PD cath functional.  GASTROINTESTINAL A:  Presented with abd pain P:   - Cont renal diet.   HEMATOLOGIC A: No active issues P:  -Monitor CBC intermittently  INFECTIOUS A:  Possible PNA Severe sepsis, resolved P:   - Abx and micro as above   ENDOCRINE A:  No active issues P:   - Cont to monitor. - Initiate SSI for glu >  180.  NEUROLOGIC A:  sedated P:   - D/C sedation. - Klonipin which patient takes at home.  TODAY'S SUMMARY:   Gary Fischer, MD ; [facility] [phone].  After 5:30 PM or weekends, call [phone]

## 2012-07-05 NOTE — Progress Notes (Signed)
[facility] ROUNDING NOTE   Subjective:   Interval History: on 5L [place], desat's off of O2, no complaints Objective:  Vital signs in last 24 hours:  Temp:  [97.9 F (36.6 C)-101.2 F (38.4 C)] 101.2 F (38.4 C) (04/17 0900) Pulse Rate:  [88-114] 92 (04/17 0900) Resp:  [18-48] 27 (04/17 0900) BP: (70-131)/(35-78) 105/48 mmHg (04/17 0900) SpO2:  [93 %-100 %] 100 % (04/17 0900) FiO2 (%):  [60 %-80 %] 70 % (04/17 0900) Weight:  [144.5 kg (318 lb 9 oz)-146.6 kg (323 lb 3.1 oz)] 144.5 kg (318 lb 9 oz) (04/17 0500)  Weight change: 3.127 kg (6 lb 14.3 oz) Filed Weights   07/04/12 0500 07/04/12 2120 07/05/12 0500  Weight: 143.473 kg (316 lb 4.8 oz) 146.6 kg (323 lb 3.1 oz) 144.5 kg (318 lb 9 oz)    Intake/Output: I/O last 3 completed shifts: In: 1072.3 [P.O.:720; I.V.:142.3; IV Piggyback:210] Out: 59 [Other:57; Stool:2]   Intake/Output this shift:  Total I/O In: 10 [I.V.:10] Out: -  No distress slight dypnea to conversation CVS- RRR systolic murmur Rigth IJ RS- CTA extubated ABD- BS present soft non-distended EXT- no edema   Basic Metabolic Panel:  Recent Labs Lab 07/02/12 0200 07/03/12 0358 07/04/12 0445 07/04/12 2300 07/05/12 0420  NA 132* 136 139 137 137  K 3.4* 3.0* 4.4 4.1 4.5  CL 87* 96 101 99 99  CO2 23 26 27 25 25   GLUCOSE 111* 103* 101* 110* 121*  BUN 65* 47* 29* 34* 38*  CREATININE 17.82* 12.57* 8.36* 10.20* 11.03*  CALCIUM 8.6 8.6 8.5 8.5 8.7  MG 1.9  --  2.5  --  2.3  PHOS  --  5.4* 5.7*  --  5.5*    Liver Function Tests:  Recent Labs Lab 07/01/12 1627 07/03/12 0358 07/04/12 0445  AST 30  --   --   ALT 33  --   --   ALKPHOS 58  --   --   BILITOT 0.3  --   --   PROT 7.6  --   --   ALBUMIN 2.6* 2.2* 2.3*    Recent Labs Lab 07/01/12 1627  LIPASE 41   No results found for this basename: AMMONIA,  in the last 168 hours  CBC:  Recent Labs Lab 07/01/12 1627 07/02/12 0200 07/03/12 0358 07/04/12 0445 07/04/12 2300  07/05/12 0420  WBC 8.3 11.5* 7.5 9.2 12.0* 17.0*  NEUTROABS 6.3  --   --   --   --   --   HGB 9.9* 10.2* 8.6* 8.5* 8.5* 8.0*  HCT 30.3* 31.3* 26.3* 28.0* 27.4* 25.2*  MCV 93.2 93.7 91.6 96.6 96.1 95.8  PLT 178 219 153 171 218 172    Cardiac Enzymes:  Recent Labs Lab 07/01/12 2123 07/04/12 2300  TROPONINI <0.30 <0.30    BNP: No components found with this basename: POCBNP,   CBG:  Recent Labs Lab 07/02/12 0722 07/02/12 1146 07/04/12 2305  GLUCAP 112* 89 98    Microbiology: Results for orders placed during the hospital encounter of 07/01/12  MRSA PCR SCREENING     Status: None   Collection Time    07/02/12 12:17 AM      Result Value Range Status   MRSA by PCR NEGATIVE  NEGATIVE Final   Comment:            The GeneXpert MRSA Assay (FDA     approved for NASAL specimens     only), is one component of a  comprehensive MRSA colonization     surveillance program. It is not     intended to diagnose MRSA     infection nor to guide or     monitor treatment for     MRSA infections.  CULTURE, BLOOD (ROUTINE X 2)     Status: None   Collection Time    07/02/12  3:30 AM      Result Value Range Status   Specimen Description BLOOD LEFT HAND   Final   Special Requests BOTTLES DRAWN AEROBIC ONLY 6CC   Final   Culture  Setup Time 07/02/2012 07:34   Final   Culture     Final   Value:        BLOOD CULTURE RECEIVED NO GROWTH TO DATE CULTURE WILL BE HELD FOR 5 DAYS BEFORE ISSUING A FINAL NEGATIVE REPORT   Report Status PENDING   Incomplete  CULTURE, BLOOD (ROUTINE X 2)     Status: None   Collection Time    07/02/12  3:45 AM      Result Value Range Status   Specimen Description BLOOD LEFT HAND   Final   Special Requests BOTTLES DRAWN AEROBIC ONLY 3CC   Final   Culture  Setup Time 07/02/2012 07:34   Final   Culture     Final   Value:        BLOOD CULTURE RECEIVED NO GROWTH TO DATE CULTURE WILL BE HELD FOR 5 DAYS BEFORE ISSUING A FINAL NEGATIVE REPORT   Report Status PENDING    Incomplete  BODY FLUID CULTURE     Status: None   Collection Time    07/02/12  5:57 AM      Result Value Range Status   Specimen Description FLUID PERITONEAL   Final   Special Requests Normal   Final   Gram Stain     Final   Value: ABUNDANT WBC PRESENT,BOTH PMN AND MONONUCLEAR     NO ORGANISMS SEEN   Culture NO GROWTH 1 DAY   Final   Report Status PENDING   Incomplete    Coagulation Studies: No results found for this basename: LABPROT, INR,  in the last 72 hours  Urinalysis: No results found for this basename: COLORURINE, APPERANCEUR, LABSPEC, PHURINE, GLUCOSEU, HGBUR, BILIRUBINUR, KETONESUR, PROTEINUR, UROBILINOGEN, NITRITE, LEUKOCYTESUR,  in the last 72 hours    Imaging: Dg Chest Port 1 View  07/04/2012  *RADIOLOGY REPORT*  Clinical Data: Acute dyspnea.  PORTABLE CHEST - 1 VIEW  Comparison: 07/03/2012 and 07/02/2012  Findings: Central catheters are unchanged.  NG tube and endotracheal tube have been removed.  Chronic cardiomegaly. Pulmonary vascularity is within normal limits considering the shallow inspiration.  Slight atelectasis at the bases is essentially unchanged.  IMPRESSION: No acute abnormalities.  Slight atelectasis, stable.   Original Report Authenticated By: Francene Boyers, M.D.      Medications:   . dialysis solution 1.5% low-MG/low-CA    . dialysis solution 2.5% low-MG/low-CA     . antiseptic oral rinse  15 mL Mouth Rinse QID  . aspirin  325 mg Per Tube Daily  . calcitRIOL  0.25 mcg Oral Custom  . dialysis solution for CAPD/[facility]PD [facility])   Peritoneal Dialysis Once in dialysis  . dialysis solution 2.5% low-MG/low-CA   Intraperitoneal Once in dialysis  . lanthanum  1,000 mg Oral TID WC  . pantoprazole (PROTONIX) IV  40 mg Intravenous QHS  . piperacillin-tazobactam (ZOSYN)  IV  2.25 g Intravenous Q8H  . sodium chloride  3 mL  Intravenous Q12H   sodium chloride, sodium chloride, sodium chloride, clonazePAM, diphenhydrAMINE, heparin, simethicone, sodium  chloride  Assessment/ Plan:  1. ESRD Patient receives peritoneal dialysis with Dr Fatima Sanger at [facility]. Did CRRT on admission due to shock, today PD cath not draining. Will try to flush cath, get KUB check for cath placement, eval for constipation. In meantime use HD, HD today 2. Resp- tachpneic, last cxr negative for edema. Repeat today 3. Possible bacterial peritonitis. Given empiric antibiotics and cultures are negative. PD fluid cell ct was 1362 (47%pmn) on 4/14. CT scan without abcess or signs of perforation. WBC and fever were better, now worse today. On vanc and zosyn IV. If septic picture gets any worse, may need to have PD cath removed.  4. 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 HTN/VOL- BP low here. Was not on any BP meds at home [facility] listed, but pt had discontinued)   Gary Moselle  MD [phone] pgr    [phone] cell 07/05/2012, 11:54 AM

## 2012-07-05 NOTE — Progress Notes (Signed)
ABG    Component Value Date/Time   PHART 7.238* 07/05/2012 0542   PCO2ART 59.4* 07/05/2012 0542   PO2ART 122.0* 07/05/2012 0542   HCO3 25.3* 07/05/2012 0542   TCO2 27 07/05/2012 0542   ACIDBASEDEF 2.0 07/05/2012 0542   O2SAT 98.0 07/05/2012 0542   Results called to MD, will continue patient on current settings.

## 2012-07-05 NOTE — Progress Notes (Signed)
07/05/2012 5:58 PM  Attempted drain from dialysate instilled earlier in the day.  Only obtained about 75cc of cloudy yellow fluid with fibrin off of patient.  All attempts made to get more fluid off of patient unsuccessful.  Dr. Arlean Hopping and Dr. Kathrene Bongo made aware.  No further orders received.  Will reattempt to get catheter to function tomorrow per Dr. Arlean Hopping.   Eunice Blase

## 2012-07-05 NOTE — Progress Notes (Addendum)
Pt had complaints of abdominal "gas pains" at 8pm. Pt was alert and oriented x4, breathing unlabored, and denied distress. At 10pm, pt became extremely anxious and SOB with pursed lip breathing. O2 saturation with 4 L [place] dropped down to 65%, HR at 200, tachypnea, RR 38, BP 79/50, temp 99.9.  Non re-breather placed and rapid response called. April RN from rapid responded and pt was transferred to 2900 for further monitoring. Mcquaid MD, Wylene Men RT at bedside for patient transfer. Report given to [facility].

## 2012-07-05 NOTE — Progress Notes (Signed)
Pt is hypotensive; called Dr. [name]; he ordered to stop pulling fluid the rest of the tx.

## 2012-07-05 NOTE — Progress Notes (Signed)
Spoke with Dr Caryn Section regarding PD cath not draining.  Order received to stop PD exchanges.

## 2012-07-06 ENCOUNTER — Inpatient Hospital Stay (HOSPITAL_COMMUNITY): Payer: Medicare Other

## 2012-07-06 DIAGNOSIS — K658 Other peritonitis: Secondary | ICD-10-CM

## 2012-07-06 DIAGNOSIS — I5020 Unspecified systolic (congestive) heart failure: Secondary | ICD-10-CM

## 2012-07-06 DIAGNOSIS — I509 Heart failure, unspecified: Secondary | ICD-10-CM

## 2012-07-06 DIAGNOSIS — I2789 Other specified pulmonary heart diseases: Secondary | ICD-10-CM

## 2012-07-06 LAB — BASIC METABOLIC PANEL
CO2: 29 mEq/L (ref 19–32)
Chloride: 100 mEq/L (ref 96–112)
Creatinine, Ser: 7.93 mg/dL — ABNORMAL HIGH (ref 0.50–1.35)
Glucose, Bld: 95 mg/dL (ref 70–99)

## 2012-07-06 LAB — CBC
Hemoglobin: 7.2 g/dL — ABNORMAL LOW (ref 13.0–17.0)
MCV: 96.3 fL (ref 78.0–100.0)
Platelets: 150 10*3/uL (ref 150–400)
RBC: 2.41 MIL/uL — ABNORMAL LOW (ref 4.22–5.81)
WBC: 11.7 10*3/uL — ABNORMAL HIGH (ref 4.0–10.5)

## 2012-07-06 LAB — PREPARE RBC (CROSSMATCH)

## 2012-07-06 LAB — HEPATITIS B SURFACE ANTIBODY,QUALITATIVE: Hep B S Ab: REACTIVE — AB

## 2012-07-06 MED ORDER — HEPARIN SODIUM (PORCINE) 1000 UNIT/ML DIALYSIS: 1000.0000 [IU] | INTRAMUSCULAR | Status: DC | PRN

## 2012-07-06 MED ORDER — NEPRO/CARBSTEADY PO LIQD: 237.0000 mL | ORAL | Status: DC | PRN

## 2012-07-06 MED ORDER — PENTAFLUOROPROP-TETRAFLUOROETH EX AERO: 1.0000 "application " | INHALATION_SPRAY | CUTANEOUS | Status: DC | PRN

## 2012-07-06 MED ORDER — HEPARIN SODIUM (PORCINE) 1000 UNIT/ML DIALYSIS
5000.0000 [IU] | Freq: Once | INTRAMUSCULAR | Status: AC
  Administered 2012-07-06: 5000 [IU] via INTRAVENOUS_CENTRAL

## 2012-07-06 MED ORDER — OXYCODONE-ACETAMINOPHEN 5-325 MG PO TABS
1.0000 | ORAL_TABLET | Freq: Four times a day (QID) | ORAL | Status: DC | PRN
  Administered 2012-07-06 – 2012-07-07 (×3): 1 via ORAL
  Filled 2012-07-06 (×3): qty 1

## 2012-07-06 MED ORDER — SODIUM CHLORIDE 0.9 % IV SOLN: 100.0000 mL | INTRAVENOUS | Status: DC | PRN

## 2012-07-06 MED ORDER — LIDOCAINE HCL (PF) 1 % IJ SOLN: 5.0000 mL | INTRAMUSCULAR | Status: DC | PRN

## 2012-07-06 MED ORDER — PANTOPRAZOLE SODIUM 40 MG PO TBEC
40.0000 mg | DELAYED_RELEASE_TABLET | Freq: Every day | ORAL | Status: DC
  Administered 2012-07-06 – 2012-07-07 (×2): 40 mg via ORAL
  Filled 2012-07-06 (×2): qty 1

## 2012-07-06 MED ORDER — DARBEPOETIN ALFA-POLYSORBATE 100 MCG/0.5ML IJ SOLN
100.0000 ug | INTRAMUSCULAR | Status: DC
  Administered 2012-07-06: 100 ug via INTRAVENOUS
  Filled 2012-07-06: qty 0.5

## 2012-07-06 MED ORDER — SODIUM CHLORIDE 0.9 % IV SOLN
100.0000 mg | Freq: Every day | INTRAVENOUS | Status: DC
  Administered 2012-07-07 – 2012-07-09 (×2): 100 mg via INTRAVENOUS
  Filled 2012-07-06 (×5): qty 100

## 2012-07-06 MED ORDER — LIDOCAINE-PRILOCAINE 2.5-2.5 % EX CREA: 1.0000 "application " | TOPICAL_CREAM | CUTANEOUS | Status: DC | PRN

## 2012-07-06 MED ORDER — ALTEPLASE 2 MG IJ SOLR
2.0000 mg | Freq: Once | INTRAMUSCULAR | Status: DC
  Filled 2012-07-06: qty 2

## 2012-07-06 MED ORDER — ALTEPLASE 2 MG IJ SOLR: 2.0000 mg | Freq: Once | INTRAMUSCULAR | Status: DC | PRN

## 2012-07-06 NOTE — Progress Notes (Signed)
Report called to Willaim Sheng, RN on dialysis unit; Left IJ TLC removed per protocol; occlusive/gauze/tegaderm dressing applied; pt tolerated well; peripheral IV x 2 inserted per MD orders; ID doctor at bedside talking c pt; will continue to monitor

## 2012-07-06 NOTE — Progress Notes (Signed)
PD fluid is now growing yeast.  This is c/w fungal peritonitis which is an absolute indication for PD cath removal.  Also will need antifungal therapy added and would recommend ID input as well.  Discussed with primary MD  [name]  MD [phone] pgr    [phone] cell 07/06/2012, 2:46 PM

## 2012-07-06 NOTE — Progress Notes (Signed)
ANTIBIOTIC CONSULT NOTE - FOLLOW UP  Pharmacy Consult for Vancomycin and Zosyn Indication: R/O PNA, Peritonitis   Allergies  Allergen Reactions  . Renvela (Sevelamer) Nausea And Vomiting    Patient Measurements: Height: 6' (182.9 cm) Weight: 313 lb 0.9 oz (142 kg) IBW/kg (Calculated) : 77.6 Adjusted Body Weight:   Vital Signs: Temp: 100.5 F (38.1 C) (04/18 0800) Temp src: Oral (04/18 0800) BP: 99/46 mmHg (04/18 1031) Pulse Rate: 94 (04/18 1031) Intake/Output from previous day: 04/17 0701 - 04/18 0700 In: 523 [P.O.:510; I.V.:13] Out: 1159  Intake/Output from this shift: Total I/O In: 250 [P.O.:250] Out: -   Labs:  Recent Labs  07/04/12 2300 07/05/12 0420 07/06/12 0440  WBC 12.0* 17.0* 11.7*  HGB 8.5* 8.0* 7.2*  PLT 218 172 150  CREATININE 10.20* 11.03* 7.93*   Estimated Creatinine Clearance: 15.9 ml/min (by C-G formula based on Cr of 7.93).  Recent Labs  07/04/12 0445 07/05/12 0424  VANCORANDOM 25.2 26.0     Microbiology: Recent Results (from the past 720 hour(s))  BODY FLUID CULTURE     Status: None   Collection Time    06/19/12  8:22 PM      Result Value Range Status   Specimen Description PERITONEAL FLUID   Final   Special Requests NONE   Final   Gram Stain     Final   Value: NO WBC SEEN     NO ORGANISMS SEEN   Culture NO GROWTH 3 DAYS   Final   Report Status 06/23/2012 FINAL   Final  MRSA PCR SCREENING     Status: None   Collection Time    07/02/12 12:17 AM      Result Value Range Status   MRSA by PCR NEGATIVE  NEGATIVE Final   Comment:            The GeneXpert MRSA Assay (FDA     approved for NASAL specimens     only), is one component of a     comprehensive MRSA colonization     surveillance program. It is not     intended to diagnose MRSA     infection nor to guide or     monitor treatment for     MRSA infections.  CULTURE, BLOOD (ROUTINE X 2)     Status: None   Collection Time    07/02/12  3:30 AM      Result Value Range  Status   Specimen Description BLOOD LEFT HAND   Final   Special Requests BOTTLES DRAWN AEROBIC ONLY 6CC   Final   Culture  Setup Time 07/02/2012 07:34   Final   Culture     Final   Value:        BLOOD CULTURE RECEIVED NO GROWTH TO DATE CULTURE WILL BE HELD FOR 5 DAYS BEFORE ISSUING A FINAL NEGATIVE REPORT   Report Status PENDING   Incomplete  CULTURE, BLOOD (ROUTINE X 2)     Status: None   Collection Time    07/02/12  3:45 AM      Result Value Range Status   Specimen Description BLOOD LEFT HAND   Final   Special Requests BOTTLES DRAWN AEROBIC ONLY 3CC   Final   Culture  Setup Time 07/02/2012 07:34   Final   Culture     Final   Value:        BLOOD CULTURE RECEIVED NO GROWTH TO DATE CULTURE WILL BE HELD FOR 5 DAYS BEFORE ISSUING A FINAL  NEGATIVE REPORT   Report Status PENDING   Incomplete  BODY FLUID CULTURE     Status: None   Collection Time    07/02/12  5:57 AM      Result Value Range Status   Specimen Description FLUID PERITONEAL   Final   Special Requests Normal   Final   Gram Stain     Final   Value: ABUNDANT WBC PRESENT,BOTH PMN AND MONONUCLEAR     NO ORGANISMS SEEN   Culture NO GROWTH 2 DAYS   Final   Report Status PENDING   Incomplete    Anti-infectives   Start     Dose/Rate Route Frequency Ordered Stop   07/05/12 1800  vancomycin (VANCOCIN) IVPB 750 mg/150 ml premix  Status:  Discontinued     750 mg 150 mL/hr over 60 Minutes Intravenous  Once 07/05/12 1352 07/05/12 1355   07/05/12 1600  vancomycin (VANCOCIN) IVPB 750 mg/150 ml premix     750 mg 150 mL/hr over 60 Minutes Intravenous  Once 07/05/12 1355 07/05/12 1956   07/04/12 2100  piperacillin-tazobactam (ZOSYN) IVPB 2.25 g     2.25 g 100 mL/hr over 30 Minutes Intravenous 3 times per day 07/04/12 1120     07/02/12 2200  piperacillin-tazobactam (ZOSYN) IVPB 3.375 g  Status:  Discontinued     3.375 g 12.5 mL/hr over 240 Minutes Intravenous Every 6 hours 07/02/12 1703 07/04/12 1120   07/02/12 2000  vancomycin  (VANCOCIN) 1,500 mg in sodium chloride 0.9 % 500 mL IVPB  Status:  Discontinued     1,500 mg 250 mL/hr over 120 Minutes Intravenous Every 24 hours 07/02/12 1708 07/03/12 1245   07/02/12 0800  piperacillin-tazobactam (ZOSYN) IVPB 2.25 g  Status:  Discontinued     2.25 g 100 mL/hr over 30 Minutes Intravenous Every 8 hours 07/02/12 0054 07/02/12 1703   07/02/12 0100  vancomycin (VANCOCIN) 2,500 mg in sodium chloride 0.9 % 500 mL IVPB     2,500 mg 250 mL/hr over 120 Minutes Intravenous Every 12 hours 07/02/12 0054 07/02/12 0409   07/02/12 0100  piperacillin-tazobactam (ZOSYN) IVPB 2.25 g     2.25 g 100 mL/hr over 30 Minutes Intravenous  Once 07/02/12 0054 07/02/12 0519   07/01/12 2245  cefTRIAXone (ROCEPHIN) 1 g in dextrose 5 % 50 mL IVPB     1 g 100 mL/hr over 30 Minutes Intravenous  Once 07/01/12 2236 07/01/12 2336   07/01/12 2245  azithromycin (ZITHROMAX) 500 mg in dextrose 5 % 250 mL IVPB  Status:  Discontinued     500 mg 250 mL/hr over 60 Minutes Intravenous  Once 07/01/12 2236 07/02/12 0218      Assessment: 51yom on Vancomycin and Zosyn Day 5 for possible PNA and peritonitis. CRRT was stopped 4/16 and CAPD was attempted but unsuccessful due to draining complications. Patient received HD 4/17 with plans for an additional HD session today. Patient continues to spike fevers but fever curve has trended down (Tmax 100.5). Per MD notes, patient is improving on antibiotics.   Expected current Vancomycin level ~21 mcg/ml - will order additional Vancomycin dose to be given at end of HD session today. Per Renal note, no additional plans for dialysis this weekend. Continue current Zosyn regimen - dosed appropriate for HD. - WBC trending down - Cultures NGTD  Goal of Therapy:  Pre-HD Vancomycin level: 15-25 mcg/ml  Plan:  1. Vancomycin 1.25g IV x 1 after HD this evening.  2. Depending on dialysis plans, may check Vancomycin random  with Monday's AM labs 3. Continue Zosyn 2.25g IV q8h 4.  Follow-up antibiotic plan, renal plan and clinical status   Cleon Dew [phone] 07/06/2012,11:57 AM

## 2012-07-06 NOTE — Progress Notes (Signed)
[facility] PROGRESS NOTE  DAMAIN BROADUS YNW:[id number] DOB: 08/24/1960 DOA: 07/01/2012 PCP: [name], MD  Assessment/Plan: 1. Acute respiratory failure with hypercapnia 2. CKD on peritoneal dialysis 3. Pulmonary HTN 4. Hypertension 5. 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 Leukocytosis  1. Pulmonologist's recommendations reviewed and discussed with nursing.  At this point will d/c Bipap. Patient's respiratory condition improved.  Reportedly had > 1 liter removed yesterday with dialysis.  CXR reported cardiomegaly and pulmonary vascular congestion.  Agree with incentive spirometry 2. CKD: Nephrology on board and will defer dialysis recommendations to them.  Will f/u with their evaluation and recommendations.  Reportedly PD cath has no been draining.  Further decisions to follow from nephrology. 3. Pulmonary HTN: per Pulmonologist. 4. HTN: At this point patient's blood pressures have been on the soft side.  Will hold blood pressure medication and continue to monitor and blood pressures. Reportedly he has history of soft blood pressures.   5. Systolic CHF: Blood pressures too soft for B blocker.  At this juncture patient is to get dialysis and extra fluid will try to be taken off.  Strict I and O's. Daily weights. 6. Thought to be 2ary to peritonitis pulm suspecting pna although chest x ray's make no mention of infiltrate or consolidation. Agree with Vanc and Zosyn at this juncture.  Will continue to monitor. Patient still spiking fevers. Blood cultures negative  Code Status: full Family Communication: no family at bedside.  Disposition Plan: Pending further improvement in condition   Consultants:  PCCM  Nephrology  Antibiotics:  Vanc and Zosyn  HPI/Subjective: Patient has no new complaints. Feeling better today. No acute issues reported overnight.  Objective: Filed Vitals:   07/06/12 0817 07/06/12 0900 07/06/12 1000 07/06/12 1031   BP:  92/47 79/48 99/46   Pulse: 97 96 98 94  Temp:      TempSrc:      Resp: 28 28 29 28   Height:      Weight:      SpO2: 97% 95% 90% 93%    Intake/Output Summary (Last 24 hours) at 07/06/12 1050 Last data filed at 07/06/12 0800  Gross per 24 hour  Intake    760 ml  Output   1159 ml  Net   -399 ml   Filed Weights   07/05/12 0500 07/05/12 1407 07/06/12 0500  Weight: 144.5 kg (318 lb 9 oz) 143.9 kg (317 lb 3.9 oz) 142 kg (313 lb 0.9 oz)    Exam:   General:  Pt in NAD, Alert and Awake  Cardiovascular: RRR, No murmurs  Respiratory: increased work of breathing. Mild expiratory wheezes  Abdomen: soft, NT, ND  Musculoskeletal: no clubbing   Data Reviewed: Basic Metabolic Panel:  Recent Labs Lab 07/02/12 0200 07/03/12 0358 07/04/12 0445 07/04/12 2300 07/05/12 0420 07/06/12 0440  NA 132* 136 139 137 137 138  K 3.4* 3.0* 4.4 4.1 4.5 3.9  CL 87* 96 101 99 99 100  CO2 23 26 27 25 25 29   GLUCOSE 111* 103* 101* 110* 121* 95  BUN 65* 47* 29* 34* 38* 28*  CREATININE 17.82* 12.57* 8.36* 10.20* 11.03* 7.93*  CALCIUM 8.6 8.6 8.5 8.5 8.7 8.5  MG 1.9  --  2.5  --  2.3  --   PHOS  --  5.4* 5.7*  --  5.5*  --    Liver Function Tests:  Recent Labs Lab 07/01/12 1627 07/03/12 0358 07/04/12 0445  AST 30  --   --  ALT 33  --   --   ALKPHOS 58  --   --   BILITOT 0.3  --   --   PROT 7.6  --   --   ALBUMIN 2.6* 2.2* 2.3*    Recent Labs Lab 07/01/12 1627  LIPASE 41   No results found for this basename: AMMONIA,  in the last 168 hours CBC:  Recent Labs Lab 07/01/12 1627  07/03/12 0358 07/04/12 0445 07/04/12 2300 07/05/12 0420 07/06/12 0440  WBC 8.3  < > 7.5 9.2 12.0* 17.0* 11.7*  NEUTROABS 6.3  --   --   --   --   --   --   HGB 9.9*  < > 8.6* 8.5* 8.5* 8.0* 7.2*  HCT 30.3*  < > 26.3* 28.0* 27.4* 25.2* 23.2*  MCV 93.2  < > 91.6 96.6 96.1 95.8 96.3  PLT 178  < > 153 171 218 172 150  < > = values in this interval not displayed. Cardiac Enzymes:  Recent  Labs Lab 07/01/12 2123 07/04/12 2300  TROPONINI <0.30 <0.30   BNP (last 3 results)  Recent Labs  09/07/11 2000 06/19/12 1927  PROBNP 24858.0* 3703.0*   CBG:  Recent Labs Lab 07/02/12 0722 07/02/12 1146 07/04/12 2305  GLUCAP 112* 89 98    Recent Results (from the past 240 hour(s))  MRSA PCR SCREENING     Status: None   Collection Time    07/02/12 12:17 AM      Result Value Range Status   MRSA by PCR NEGATIVE  NEGATIVE Final   Comment:            The GeneXpert MRSA Assay (FDA     approved for NASAL specimens     only), is one component of a     comprehensive MRSA colonization     surveillance program. It is not     intended to diagnose MRSA     infection nor to guide or     monitor treatment for     MRSA infections.  CULTURE, BLOOD (ROUTINE X 2)     Status: None   Collection Time    07/02/12  3:30 AM      Result Value Range Status   Specimen Description BLOOD LEFT HAND   Final   Special Requests BOTTLES DRAWN AEROBIC ONLY 6CC   Final   Culture  Setup Time 07/02/2012 07:34   Final   Culture     Final   Value:        BLOOD CULTURE RECEIVED NO GROWTH TO DATE CULTURE WILL BE HELD FOR 5 DAYS BEFORE ISSUING A FINAL NEGATIVE REPORT   Report Status PENDING   Incomplete  CULTURE, BLOOD (ROUTINE X 2)     Status: None   Collection Time    07/02/12  3:45 AM      Result Value Range Status   Specimen Description BLOOD LEFT HAND   Final   Special Requests BOTTLES DRAWN AEROBIC ONLY 3CC   Final   Culture  Setup Time 07/02/2012 07:34   Final   Culture     Final   Value:        BLOOD CULTURE RECEIVED NO GROWTH TO DATE CULTURE WILL BE HELD FOR 5 DAYS BEFORE ISSUING A FINAL NEGATIVE REPORT   Report Status PENDING   Incomplete  BODY FLUID CULTURE     Status: None   Collection Time    07/02/12  5:57 AM  Result Value Range Status   Specimen Description FLUID PERITONEAL   Final   Special Requests Normal   Final   Gram Stain     Final   Value: ABUNDANT WBC PRESENT,BOTH PMN  AND MONONUCLEAR     NO ORGANISMS SEEN   Culture NO GROWTH 2 DAYS   Final   Report Status PENDING   Incomplete     Studies: Dg Abd 1 View  07/05/2012  *RADIOLOGY REPORT*  Clinical Data: Peritoneal dialysis catheter not draining.  Evaluate for position of catheter.  Question constipation.  ABDOMEN - 1 VIEW  Comparison: CT of 07/01/2012  Findings: Multiple supine views, performed portably.  These are moderate to markedly degraded by patient body habitus.  Gas within normal caliber stomach.  Increased density within the right side abdomen is likely due to contrast within the ascending colon from prior CT.  A large bore catheter projects over the midabdomen.  This could represent the external course of the dialysis catheter.  The remainder of the catheter is not confidently identified.  There is a moderate amount of colonic stool.  The low pelvis is excluded.  Images are motion degraded.  IMPRESSION: Moderately degraded exam, secondary multiple factors detailed above.  Lack of confident visualization of the dialysis catheter.  Its external course may be identified, terminating over the left paracentral abdomen.  Moderate amount of ascending colonic stool.  Low pelvis excluded.   Original Report Authenticated By: Jeronimo Greaves, M.D.    Dg Chest Port 1 View  07/06/2012  *RADIOLOGY REPORT*  Clinical Data: Respiratory failure, follow  PORTABLE CHEST - 1 VIEW  Comparison: A portable chest x-ray of 07/05/2012  Findings: There is little change in poor aeration with mild pulmonary vascular congestion remaining.  Cardiomegaly is stable. Left and right central venous lines are unchanged in position.  No pneumothorax is noted.  IMPRESSION: Little change in poor aeration with cardiomegaly and pulmonary vascular congestion.   Original Report Authenticated By: Dwyane Dee, M.D.    Dg Chest Port 1 View  07/05/2012  *RADIOLOGY REPORT*  Clinical Data: Increased respiratory rate.  Question volume overload.  PORTABLE CHEST - 1  VIEW  Comparison: Plain film chest 07/04/2012.  Findings: Bilateral internal jugular central venous catheters are unchanged.  Lung volumes are low with basilar atelectasis, also unchanged.  No pulmonary edema is identified.  There is cardiomegaly.  IMPRESSION:  No acute abnormality with bibasilar atelectasis and cardiomegaly again seen.   Original Report Authenticated By: Holley Dexter, M.D.    Dg Chest Port 1 View  07/04/2012  *RADIOLOGY REPORT*  Clinical Data: Acute dyspnea.  PORTABLE CHEST - 1 VIEW  Comparison: 07/03/2012 and 07/02/2012  Findings: Central catheters are unchanged.  NG tube and endotracheal tube have been removed.  Chronic cardiomegaly. Pulmonary vascularity is within normal limits considering the shallow inspiration.  Slight atelectasis at the bases is essentially unchanged.  IMPRESSION: No acute abnormalities.  Slight atelectasis, stable.   Original Report Authenticated By: Francene Boyers, M.D.     Scheduled Meds: . antiseptic oral rinse  15 mL Mouth Rinse QID  . aspirin  325 mg Per Tube Daily  . calcitRIOL  0.25 mcg Oral Custom  . dialysis solution for CAPD/[facility]PD [facility])   Peritoneal Dialysis Once in dialysis  . dialysis solution 2.5% low-MG/low-CA   Intraperitoneal Once in dialysis  . lanthanum  1,000 mg Oral TID WC  . pantoprazole (PROTONIX) IV  40 mg Intravenous QHS  . piperacillin-tazobactam (ZOSYN)  IV  2.25 g  Intravenous Q8H  . sodium chloride  3 mL Intravenous Q12H   Continuous Infusions: . dialysis solution 1.5% low-MG/low-CA    . dialysis solution 2.5% low-MG/low-CA      Principal Problem:   Acute respiratory failure with hypercapnia Active Problems:   Systolic congestive heart failure with reduced left ventricular function, NYHA class 1- EF 40-45% 2011 ECHO   CKD (chronic kidney disease) requiring chronic PERITONEAL dialysis   HTN (hypertension)   Pulmonary HTN- 50 mmHg 2011 ECHO   Hypercapnia   Pulmonary edema   Obesity hypoventilation syndrome    Septic shock(785.52)    Time spent: > 35 minutes    Penny Pia  [facility] Pager [phone]. If 7PM-7AM, please contact night-coverage at [email or web address], password [facility] 07/06/2012, 10:50 AM  LOS: 5 days

## 2012-07-06 NOTE — Consult Note (Addendum)
Reason for Consult: infected PD catheter Referring Physician: Alin Chavira is an 52 y.o. male.  HPI:  Pt is 52 yo M with ESRD on PD who presents with around 1 week of mild abdominal pain.  Per hospitalist, nephrology requests catheter removal due to positive culture from yeast. Pt is having fevers, but does not subjectively have fevers.  He denies nausea or vomiting.  He is short of breath.  He has not been having trouble with his dialysis catheter being able to dialyze.     Past Medical History  Diagnosis Date  . Hypertension   . Renal disorder   . CHF (congestive heart failure)   . Peritoneal dialysis catheter in place   . Anemia     patient reporats that last hgb 7.9 a week ago  . Anxiety state, unspecified     Past Surgical History  Procedure Laterality Date  . Appendectomy    . Peritoneal catheter insertion      Family History  Problem Relation Age of Onset  . Diabetes Mother   . Hypertension Mother   . Hypertension Sister   . Asthma Sister     Social History:  reports that he has never smoked. He has never used smokeless tobacco. He reports that he does not drink alcohol or use illicit drugs.  Allergies:  Allergies  Allergen Reactions  . Renvela (Sevelamer) Nausea And Vomiting    Medications:  Prior to Admission:  Prescriptions prior to admission  Medication Sig Dispense Refill  . albuterol (PROVENTIL HFA;VENTOLIN HFA) 108 (90 BASE) MCG/ACT inhaler Inhale 2 puffs into the lungs every 6 (six) hours as needed for wheezing.  1 Inhaler  2  . aspirin EC 81 MG tablet Take 81 mg by mouth daily.      Marland Kitchen b complex-vitamin c-folic acid (NEPHRO-VITE) 0.8 MG TABS Take 0.8 mg by mouth daily.      . bisoprolol (ZEBETA) 5 MG tablet Take 2.5 mg by mouth daily.      . calcitRIOL (ROCALTROL) 0.25 MCG capsule Take 0.25 mcg by mouth See admin instructions. Takes 1 capsule daily Monday through friday      . clonazePAM (KLONOPIN) 0.5 MG tablet Take 0.5 mg by mouth 2 (two)  times daily as needed. anxiety      . famotidine (PEPCID) 20 MG tablet Take 20 mg by mouth at bedtime as needed for heartburn.      . lanthanum (FOSRENOL) 1000 MG chewable tablet Chew 1,000 mg by mouth 3 (three) times daily with meals.      . Multiple Vitamin (MULTIVITAMIN) tablet Take 1 tablet by mouth daily.      Marland Kitchen omeprazole (PRILOSEC) 20 MG capsule Take 20 mg by mouth daily.      . vitamin B-12 (CYANOCOBALAMIN) 100 MCG tablet Take 50 mcg by mouth daily.        Results for orders placed during the hospital encounter of 07/01/12 (from the past 48 hour(s))  CBC     Status: Abnormal   Collection Time    07/04/12 11:00 PM      Result Value Range   WBC 12.0 (*) 4.0 - 10.5 K/uL   RBC 2.85 (*) 4.22 - 5.81 MIL/uL   Hemoglobin 8.5 (*) 13.0 - 17.0 g/dL   HCT 95.2 (*) 84.1 - 32.4 %   MCV 96.1  78.0 - 100.0 fL   MCH 29.8  26.0 - 34.0 pg   MCHC 31.0  30.0 - 36.0 g/dL  RDW 15.9 (*) 11.5 - 15.5 %   Platelets 218  150 - 400 K/uL  BASIC METABOLIC PANEL     Status: Abnormal   Collection Time    07/04/12 11:00 PM      Result Value Range   Sodium 137  135 - 145 mEq/L   Potassium 4.1  3.5 - 5.1 mEq/L   Chloride 99  96 - 112 mEq/L   CO2 25  19 - 32 mEq/L   Glucose, Bld 110 (*) 70 - 99 mg/dL   BUN 34 (*) 6 - 23 mg/dL   Creatinine, Ser 40.98 (*) 0.50 - 1.35 mg/dL   Calcium 8.5  8.4 - 11.9 mg/dL   GFR calc non Af Amer 5 (*) >90 mL/min   GFR calc Af Amer 6 (*) >90 mL/min   Comment:            The eGFR has been calculated     using the CKD EPI equation.     This calculation has not been     validated in all clinical     situations.     eGFR's persistently     <90 mL/min signify     possible Chronic Kidney Disease.  LACTIC ACID, PLASMA     Status: None   Collection Time    07/04/12 11:00 PM      Result Value Range   Lactic Acid, Venous 0.8  0.5 - 2.2 mmol/L  TROPONIN I     Status: None   Collection Time    07/04/12 11:00 PM      Result Value Range   Troponin I <0.30  <0.30 ng/mL    Comment:            Due to the release kinetics of cTnI,     a negative result within the first hours     of the onset of symptoms does not rule out     myocardial infarction with certainty.     If myocardial infarction is still suspected,     repeat the test at appropriate intervals.  GLUCOSE, CAPILLARY     Status: None   Collection Time    07/04/12 11:05 PM      Result Value Range   Glucose-Capillary 98  70 - 99 mg/dL  POCT I-STAT 3, BLOOD GAS (G3+)     Status: Abnormal   Collection Time    07/04/12 11:06 PM      Result Value Range   pH, Arterial 7.248 (*) 7.350 - 7.450   pCO2 arterial 58.0 (*) 35.0 - 45.0 mmHg   pO2, Arterial 56.0 (*) 80.0 - 100.0 mmHg   Bicarbonate 25.3 (*) 20.0 - 24.0 mEq/L   TCO2 27  0 - 100 mmol/L   O2 Saturation 83.0     Acid-base deficit 2.0  0.0 - 2.0 mmol/L   Collection site RADIAL, ALLEN'S TEST ACCEPTABLE     Drawn by Operator     Sample type ARTERIAL    POCT I-STAT 3, BLOOD GAS (G3+)     Status: Abnormal   Collection Time    07/05/12 12:06 AM      Result Value Range   pH, Arterial 7.249 (*) 7.350 - 7.450   pCO2 arterial 60.0 (*) 35.0 - 45.0 mmHg   pO2, Arterial 72.0 (*) 80.0 - 100.0 mmHg   Bicarbonate 26.3 (*) 20.0 - 24.0 mEq/L   TCO2 28  0 - 100 mmol/L   O2 Saturation 91.0  Acid-base deficit 1.0  0.0 - 2.0 mmol/L   Patient temperature 98.6 F     Collection site RADIAL, ALLEN'S TEST ACCEPTABLE     Sample type ARTERIAL     Comment NOTIFIED PHYSICIAN    CBC     Status: Abnormal   Collection Time    07/05/12  4:20 AM      Result Value Range   WBC 17.0 (*) 4.0 - 10.5 K/uL   RBC 2.63 (*) 4.22 - 5.81 MIL/uL   Hemoglobin 8.0 (*) 13.0 - 17.0 g/dL   HCT 16.1 (*) 09.6 - 04.5 %   MCV 95.8  78.0 - 100.0 fL   MCH 30.4  26.0 - 34.0 pg   MCHC 31.7  30.0 - 36.0 g/dL   RDW 40.9 (*) 81.1 - 91.4 %   Platelets 172  150 - 400 K/uL  BASIC METABOLIC PANEL     Status: Abnormal   Collection Time    07/05/12  4:20 AM      Result Value Range   Sodium  137  135 - 145 mEq/L   Potassium 4.5  3.5 - 5.1 mEq/L   Chloride 99  96 - 112 mEq/L   CO2 25  19 - 32 mEq/L   Glucose, Bld 121 (*) 70 - 99 mg/dL   BUN 38 (*) 6 - 23 mg/dL   Creatinine, Ser 78.29 (*) 0.50 - 1.35 mg/dL   Calcium 8.7  8.4 - 56.2 mg/dL   GFR calc non Af Amer 5 (*) >90 mL/min   GFR calc Af Amer 5 (*) >90 mL/min   Comment:            The eGFR has been calculated     using the CKD EPI equation.     This calculation has not been     validated in all clinical     situations.     eGFR's persistently     <90 mL/min signify     possible Chronic Kidney Disease.  MAGNESIUM     Status: None   Collection Time    07/05/12  4:20 AM      Result Value Range   Magnesium 2.3  1.5 - 2.5 mg/dL  PHOSPHORUS     Status: Abnormal   Collection Time    07/05/12  4:20 AM      Result Value Range   Phosphorus 5.5 (*) 2.3 - 4.6 mg/dL  VANCOMYCIN, RANDOM     Status: None   Collection Time    07/05/12  4:24 AM      Result Value Range   Vancomycin Rm 26.0     Comment:            Random Vancomycin therapeutic     range is dependent on dosage and     time of specimen collection.     A peak range is 20.0-40.0 ug/mL     A trough range is 5.0-15.0 ug/mL             POCT I-STAT 3, BLOOD GAS (G3+)     Status: Abnormal   Collection Time    07/05/12  5:42 AM      Result Value Range   pH, Arterial 7.238 (*) 7.350 - 7.450   pCO2 arterial 59.4 (*) 35.0 - 45.0 mmHg   pO2, Arterial 122.0 (*) 80.0 - 100.0 mmHg   Bicarbonate 25.3 (*) 20.0 - 24.0 mEq/L   TCO2 27  0 - 100 mmol/L   O2 Saturation  98.0     Acid-base deficit 2.0  0.0 - 2.0 mmol/L   Patient temperature 98.6 F     Collection site RADIAL, ALLEN'S TEST ACCEPTABLE     Sample type ARTERIAL     Comment NOTIFIED PHYSICIAN    HEPATITIS B SURFACE ANTIGEN     Status: None   Collection Time    07/05/12  2:00 PM      Result Value Range   Hepatitis B Surface Ag NEGATIVE  NEGATIVE  HEPATITIS B CORE ANTIBODY, TOTAL     Status: None   Collection  Time    07/05/12  2:00 PM      Result Value Range   Hep B Core Total Ab NEGATIVE  NEGATIVE  HEPATITIS B SURFACE ANTIBODY     Status: Abnormal   Collection Time    07/05/12  2:00 PM      Result Value Range   Hep B S Ab Reactive (*) NONREACTIVE  BASIC METABOLIC PANEL     Status: Abnormal   Collection Time    07/06/12  4:40 AM      Result Value Range   Sodium 138  135 - 145 mEq/L   Potassium 3.9  3.5 - 5.1 mEq/L   Chloride 100  96 - 112 mEq/L   CO2 29  19 - 32 mEq/L   Glucose, Bld 95  70 - 99 mg/dL   BUN 28 (*) 6 - 23 mg/dL   Creatinine, Ser 7.82 (*) 0.50 - 1.35 mg/dL   Calcium 8.5  8.4 - 95.6 mg/dL   GFR calc non Af Amer 7 (*) >90 mL/min   GFR calc Af Amer 8 (*) >90 mL/min   Comment:            The eGFR has been calculated     using the CKD EPI equation.     This calculation has not been     validated in all clinical     situations.     eGFR's persistently     <90 mL/min signify     possible Chronic Kidney Disease.  CBC     Status: Abnormal   Collection Time    07/06/12  4:40 AM      Result Value Range   WBC 11.7 (*) 4.0 - 10.5 K/uL   RBC 2.41 (*) 4.22 - 5.81 MIL/uL   Hemoglobin 7.2 (*) 13.0 - 17.0 g/dL   HCT 21.3 (*) 08.6 - 57.8 %   MCV 96.3  78.0 - 100.0 fL   MCH 29.9  26.0 - 34.0 pg   MCHC 31.0  30.0 - 36.0 g/dL   RDW 46.9 (*) 62.9 - 52.8 %   Platelets 150  150 - 400 K/uL  PREPARE RBC (CROSSMATCH)     Status: None   Collection Time    07/06/12 12:15 PM      Result Value Range   Order Confirmation ORDER PROCESSED BY BLOOD BANK    TYPE AND SCREEN     Status: None   Collection Time    07/06/12 12:15 PM      Result Value Range   ABO/RH(D) A POS     Antibody Screen NEG     Sample Expiration 07/09/2012     Unit Number [id number]     Blood Component Type RED CELLS,LR     Unit division 00     Status of Unit ISSUED     Transfusion Status OK TO TRANSFUSE     Crossmatch Result  Compatible    ABO/RH     Status: None   Collection Time    07/06/12 12:15 PM       Result Value Range   ABO/RH(D) A POS      Dg Abd 1 View  07/05/2012  *RADIOLOGY REPORT*  Clinical Data: Peritoneal dialysis catheter not draining.  Evaluate for position of catheter.  Question constipation.  ABDOMEN - 1 VIEW  Comparison: CT of 07/01/2012  Findings: Multiple supine views, performed portably.  These are moderate to markedly degraded by patient body habitus.  Gas within normal caliber stomach.  Increased density within the right side abdomen is likely due to contrast within the ascending colon from prior CT.  A large bore catheter projects over the midabdomen.  This could represent the external course of the dialysis catheter.  The remainder of the catheter is not confidently identified.  There is a moderate amount of colonic stool.  The low pelvis is excluded.  Images are motion degraded.  IMPRESSION: Moderately degraded exam, secondary multiple factors detailed above.  Lack of confident visualization of the dialysis catheter.  Its external course may be identified, terminating over the left paracentral abdomen.  Moderate amount of ascending colonic stool.  Low pelvis excluded.   Original Report Authenticated By: Jeronimo Greaves, M.D.    Dg Chest Port 1 View  07/06/2012  *RADIOLOGY REPORT*  Clinical Data: Respiratory failure, follow  PORTABLE CHEST - 1 VIEW  Comparison: A portable chest x-ray of 07/05/2012  Findings: There is little change in poor aeration with mild pulmonary vascular congestion remaining.  Cardiomegaly is stable. Left and right central venous lines are unchanged in position.  No pneumothorax is noted.  IMPRESSION: Little change in poor aeration with cardiomegaly and pulmonary vascular congestion.   Original Report Authenticated By: Dwyane Dee, M.D.    Dg Chest Port 1 View  07/05/2012  *RADIOLOGY REPORT*  Clinical Data: Increased respiratory rate.  Question volume overload.  PORTABLE CHEST - 1 VIEW  Comparison: Plain film chest 07/04/2012.  Findings: Bilateral internal jugular  central venous catheters are unchanged.  Lung volumes are low with basilar atelectasis, also unchanged.  No pulmonary edema is identified.  There is cardiomegaly.  IMPRESSION:  No acute abnormality with bibasilar atelectasis and cardiomegaly again seen.   Original Report Authenticated By: Holley Dexter, M.D.    Dg Chest Port 1 View  07/04/2012  *RADIOLOGY REPORT*  Clinical Data: Acute dyspnea.  PORTABLE CHEST - 1 VIEW  Comparison: 07/03/2012 and 07/02/2012  Findings: Central catheters are unchanged.  NG tube and endotracheal tube have been removed.  Chronic cardiomegaly. Pulmonary vascularity is within normal limits considering the shallow inspiration.  Slight atelectasis at the bases is essentially unchanged.  IMPRESSION: No acute abnormalities.  Slight atelectasis, stable.   Original Report Authenticated By: Francene Boyers, M.D.     Review of Systems  Constitutional: Positive for fever.  Eyes: Negative.   Respiratory: Positive for shortness of breath.   Cardiovascular: Negative.   Gastrointestinal: Positive for abdominal pain.  All other systems reviewed and are negative.   Blood pressure 127/62, pulse 96, temperature 98.3 F (36.8 C), temperature source Oral, resp. rate 28, height 6' (1.829 m), weight 316 lb 5.8 oz (143.5 kg), SpO2 88.00%. Physical Exam  Constitutional: He is oriented to person, place, and time. He appears well-developed and well-nourished. No distress.  HENT:  Head: Normocephalic and atraumatic.  Eyes: Conjunctivae are normal. Pupils are equal, round, and reactive to light. No scleral icterus.  Neck:  Normal range of motion. Neck supple. No tracheal deviation present. No thyromegaly present.  Cardiovascular: Normal rate, regular rhythm and intact distal pulses.  Exam reveals no gallop and no friction rub.   No murmur heard. Respiratory: He is in respiratory distress. He has no wheezes. He has rales. He exhibits no tenderness.  GI: Soft. Bowel sounds are normal. He  exhibits no distension and no mass. There is no tenderness. There is no rebound and no guarding.  Musculoskeletal: Normal range of motion. He exhibits no edema and no tenderness.  Lymphadenopathy:    He has no cervical adenopathy.  Neurological: He is alert and oriented to person, place, and time. Coordination normal.  Skin: Skin is warm and dry. He is not diaphoretic.  Mucus membranes very dry  Psychiatric: He has a normal mood and affect. His behavior is normal. Judgment and thought content normal.    Assessment/Plan: Fungal peritonitis. Will need operative notes from [facility] where PD catheter put in.  Will also need to discuss with nephrology.   Antifungals Will likely remove once questions resolved.    Leana Springston 07/06/2012, 8:00 PM

## 2012-07-06 NOTE — Progress Notes (Signed)
Physical Therapy Treatment Patient Details Name: Gary Rivera MRN: [id number] DOB: Mar 31, 1960 Today's Date: 07/06/2012 Time: 5621-3086 PT Time Calculation (min): 25 min  PT Assessment / Plan / Recommendation Comments on Treatment Session  Pt con't to be limited by SOB and onset of fatigue. Pt also con't to complain of abdominal pain however better than when he was first admitted. If pt con't to progress will most likely be able to return home with RW, HHPT and 24/7 supervision.    Follow Up Recommendations  Home health PT     Does the patient have the potential to tolerate intense rehabilitation     Barriers to Discharge        Equipment Recommendations  Rolling walker with 5" wheels    Recommendations for Other Services    Frequency Min 3X/week   Plan Discharge plan remains appropriate;Frequency remains appropriate    Precautions / Restrictions Precautions Precautions: Fall (multiple lines, O2) Precaution Comments: 5 LO2 - SpO2 dec 87% with amb, recovers in 2 min to 90s Restrictions Weight Bearing Restrictions: No   Pertinent Vitals/Pain 6/10 abdominal pain    Mobility  Bed Mobility Bed Mobility: Supine to Sit;Sitting - Scoot to Edge of Bed Supine to Sit: 6: Modified independent (Device/Increase time);HOB elevated (HOB at 45 deg) Sitting - Scoot to Edge of Bed: 6: Modified independent (Device/Increase time);With rail Details for Bed Mobility Assistance: pt requires use of UEs Transfers Transfers: Sit to Stand;Stand to Sit Sit to Stand: 3: Mod assist;With upper extremity assist;With armrests;From bed;From chair/3-in-1 Stand to Sit: 4: Min assist;With upper extremity assist;To chair/3-in-1 Details for Transfer Assistance: mod A with rocking technique from lower surface height, minA from elevated surface height (pillows placed in chair to elevate surface height) Ambulation/Gait Ambulation/Gait Assistance: 4: Min assist Ambulation Distance (Feet): 100 Feet (with 1  seated rest break x 5 min) Assistive device: Rolling walker Ambulation/Gait Assistance Details: +DOE, SpO2 dec to 87% recovered s/p 2 min of seated rest to >95%. pt reports being most limited by fatigue. no knee buckling this date Gait Pattern: Step-through pattern;Trunk flexed Gait velocity: slow Stairs: No    Exercises     PT Diagnosis:    PT Problem List:   PT Treatment Interventions:     PT Goals Acute Rehab PT Goals PT Goal: Supine/Side to Sit - Progress: Progressing toward goal PT Goal: Sit to Stand - Progress: Progressing toward goal PT Goal: Stand to Sit - Progress: Progressing toward goal PT Goal: Ambulate - Progress: Progressing toward goal  Visit Information  Last PT Received On: 07/06/12 Assistance Needed: +2 (for lines and chair follow)    Subjective Data  Subjective: Pt received supine in bed agreeable to PT.   Cognition  Cognition Arousal/Alertness: Awake/alert Behavior During Therapy: WFL for tasks assessed/performed Overall Cognitive Status: Within Functional Limits for tasks assessed    Balance     End of Session PT - End of Session Equipment Utilized During Treatment: Gait belt;Oxygen Activity Tolerance: Patient limited by fatigue;Other (comment) Patient left: in chair;with call bell/phone within reach Nurse Communication: Mobility status;Other (comment)   GP     Marcene Brawn 07/06/2012, 11:51 AM  Lewis Shock, PT, DPT Pager #: [phone] Office #: [phone]

## 2012-07-06 NOTE — Progress Notes (Signed)
Dr. Leia Alf celled and informed of pts frequent drops and O2 SAT and tachypnea (resp > 30). Concerned that pts acuity may be high for 6700. Order written to transfer pt to stepdown unit.

## 2012-07-06 NOTE — Progress Notes (Signed)
Called to give report to 2900 from hemodialysis and nurse informs me he has a room on 6700. Pt is on 5L O2 and is tachypnic and frequently dropping Sats below 90%. Pt does not act to be in resp distress and fell that this is probably his baseline, however, I am not sure that he meets 6700 admitting criterior. I called repaid response and RT to evaluate pts O2 status and paged the the doctor on call for [facility] to discuss pt placement with him.

## 2012-07-06 NOTE — Consult Note (Signed)
[facility]    Date of Admission:  07/01/2012  Date of Consult:  07/06/2012  Reason for Consult: fungal peritonitis associated with PD catheter Referring Physician: Dr. Cena Benton   HPI: Gary Rivera is an 52 y.o. male. With ESRD on PD, admitted with respiratory failure that occurred during dialysis. He had been given Narcan with little improvement and ultimately needed to be intubated by critical care medicine on April 13. Blood cultures were drawn and a culture was also taken from his peritoneal fluid. He was started on vancomycin and Zosyn. Since then his peritoneal fluid is growing a yeast. We have been consult to assist in management this patient who is now been diagnosed with likely candidal infection of his peritoneal dialysis catheter and candidal peritonitis. Fungal infection of peritoneal dialysis catheters is an indication for removal of peritoneal dialysis catheter and [facility], surgery has been consulted. I'm starting him on micafungin.     Past Medical History  Diagnosis Date  . Hypertension   . Renal disorder   . CHF (congestive heart failure)   . Peritoneal dialysis catheter in place   . Anemia     patient reporats that last hgb 7.9 a week ago  . Anxiety state, unspecified     Past Surgical History  Procedure Laterality Date  . Appendectomy    . Peritoneal catheter insertion    ergies:   Allergies  Allergen Reactions  . Renvela (Sevelamer) Nausea And Vomiting     Medications: I have reviewed patients current medications as documented in Epic Anti-infectives   Start     Dose/Rate Route Frequency Ordered Stop   07/06/12 1545  micafungin (MYCAMINE) 100 mg in sodium chloride 0.9 % 100 mL IVPB     100 mg 100 mL/hr over 1 Hours Intravenous Daily 07/06/12 1541     07/05/12 1800  vancomycin (VANCOCIN) IVPB 750 mg/150 ml premix  Status:  Discontinued     750 mg 150 mL/hr over 60 Minutes Intravenous  Once 07/05/12 1352 07/05/12 1355   07/05/12 1600  vancomycin (VANCOCIN) IVPB 750 mg/150 ml premix     750 mg 150 mL/hr over 60 Minutes Intravenous  Once 07/05/12 1355 07/05/12 1956   07/04/12 2100  piperacillin-tazobactam (ZOSYN) IVPB 2.25 g     2.25 g 100 mL/hr over 30 Minutes Intravenous 3 times per day 07/04/12 1120     07/02/12 2200  piperacillin-tazobactam (ZOSYN) IVPB 3.375 g  Status:  Discontinued     3.375 g 12.5 mL/hr over 240 Minutes Intravenous Every 6 hours 07/02/12 1703 07/04/12 1120   07/02/12 2000  vancomycin (VANCOCIN) 1,500 mg in sodium chloride 0.9 % 500 mL IVPB  Status:  Discontinued     1,500 mg 250 mL/hr over 120 Minutes Intravenous Every 24 hours 07/02/12 1708 07/03/12 1245   07/02/12 0800  piperacillin-tazobactam (ZOSYN) IVPB 2.25 g  Status:  Discontinued     2.25 g 100 mL/hr over 30 Minutes Intravenous Every 8 hours 07/02/12 0054 07/02/12 1703   07/02/12 0100  vancomycin (VANCOCIN) 2,500 mg in sodium chloride 0.9 % 500 mL IVPB     2,500 mg 250 mL/hr over 120 Minutes Intravenous Every 12 hours 07/02/12 0054 07/02/12 0409   07/02/12 0100  piperacillin-tazobactam (ZOSYN) IVPB 2.25 g     2.25 g 100 mL/hr over 30 Minutes Intravenous  Once 07/02/12 0054 07/02/12 0519   07/01/12 2245  cefTRIAXone (ROCEPHIN) 1 g in dextrose 5 % 50 mL IVPB  1 g 100 mL/hr over 30 Minutes Intravenous  Once 07/01/12 2236 07/01/12 2336   07/01/12 2245  azithromycin (ZITHROMAX) 500 mg in dextrose 5 % 250 mL IVPB  Status:  Discontinued     500 mg 250 mL/hr over 60 Minutes Intravenous  Once 07/01/12 2236 07/02/12 0218      Social History:  reports that he has never smoked. He has never used smokeless tobacco. He reports that he does not drink alcohol or use illicit drugs.  Family History  Problem Relation Age of Onset  . Diabetes Mother   . Hypertension Mother   . Hypertension Sister   . Asthma Sister     As in HPI and primary teams notes otherwise 12 point review of systems is negative  Blood pressure 111/63,  pulse 87, temperature 98.7 F (37.1 C), temperature source Oral, resp. rate 23, height 6' (1.829 m), weight 316 lb 5.8 oz (143.5 kg), SpO2 93.00%. General: Alert and awake, oriented x3, not in any acute distress. HEENT: anicteric sclera, pupils reactive to light and accommodation, EOMI, oropharynx clear and without exudate, neck with HD catheter CVS regular rate, normal r,  no murmur rubs or gallops Chest: clear to auscultation bilaterally, no wheezing, rales or rhonchi Abdomen: Protuberant diffusely distended and tender to palpation throughout without rebound positive bowel sounds Skin: no rashes Neuro: nonfocal, strength and sensation intact   Results for orders placed during the hospital encounter of 07/01/12 (from the past 48 hour(s))  CBC     Status: Abnormal   Collection Time    07/04/12 11:00 PM      Result Value Range   WBC 12.0 (*) 4.0 - 10.5 K/uL   RBC 2.85 (*) 4.22 - 5.81 MIL/uL   Hemoglobin 8.5 (*) 13.0 - 17.0 g/dL   HCT 16.1 (*) 09.6 - 04.5 %   MCV 96.1  78.0 - 100.0 fL   MCH 29.8  26.0 - 34.0 pg   MCHC 31.0  30.0 - 36.0 g/dL   RDW 40.9 (*) 81.1 - 91.4 %   Platelets 218  150 - 400 K/uL  BASIC METABOLIC PANEL     Status: Abnormal   Collection Time    07/04/12 11:00 PM      Result Value Range   Sodium 137  135 - 145 mEq/L   Potassium 4.1  3.5 - 5.1 mEq/L   Chloride 99  96 - 112 mEq/L   CO2 25  19 - 32 mEq/L   Glucose, Bld 110 (*) 70 - 99 mg/dL   BUN 34 (*) 6 - 23 mg/dL   Creatinine, Ser 78.29 (*) 0.50 - 1.35 mg/dL   Calcium 8.5  8.4 - 56.2 mg/dL   GFR calc non Af Amer 5 (*) >90 mL/min   GFR calc Af Amer 6 (*) >90 mL/min   Comment:            The eGFR has been calculated     using the CKD EPI equation.     This calculation has not been     validated in all clinical     situations.     eGFR's persistently     <90 mL/min signify     possible Chronic Kidney Disease.  LACTIC ACID, PLASMA     Status: None   Collection Time    07/04/12 11:00 PM      Result Value  Range   Lactic Acid, Venous 0.8  0.5 - 2.2 mmol/L  TROPONIN I  Status: None   Collection Time    07/04/12 11:00 PM      Result Value Range   Troponin I <0.30  <0.30 ng/mL   Comment:            Due to the release kinetics of cTnI,     a negative result within the first hours     of the onset of symptoms does not rule out     myocardial infarction with certainty.     If myocardial infarction is still suspected,     repeat the test at appropriate intervals.  GLUCOSE, CAPILLARY     Status: None   Collection Time    07/04/12 11:05 PM      Result Value Range   Glucose-Capillary 98  70 - 99 mg/dL  POCT I-STAT 3, BLOOD GAS (G3+)     Status: Abnormal   Collection Time    07/04/12 11:06 PM      Result Value Range   pH, Arterial 7.248 (*) 7.350 - 7.450   pCO2 arterial 58.0 (*) 35.0 - 45.0 mmHg   pO2, Arterial 56.0 (*) 80.0 - 100.0 mmHg   Bicarbonate 25.3 (*) 20.0 - 24.0 mEq/L   TCO2 27  0 - 100 mmol/L   O2 Saturation 83.0     Acid-base deficit 2.0  0.0 - 2.0 mmol/L   Collection site RADIAL, ALLEN'S TEST ACCEPTABLE     Drawn by Operator     Sample type ARTERIAL    POCT I-STAT 3, BLOOD GAS (G3+)     Status: Abnormal   Collection Time    07/05/12 12:06 AM      Result Value Range   pH, Arterial 7.249 (*) 7.350 - 7.450   pCO2 arterial 60.0 (*) 35.0 - 45.0 mmHg   pO2, Arterial 72.0 (*) 80.0 - 100.0 mmHg   Bicarbonate 26.3 (*) 20.0 - 24.0 mEq/L   TCO2 28  0 - 100 mmol/L   O2 Saturation 91.0     Acid-base deficit 1.0  0.0 - 2.0 mmol/L   Patient temperature 98.6 F     Collection site RADIAL, ALLEN'S TEST ACCEPTABLE     Sample type ARTERIAL     Comment NOTIFIED PHYSICIAN    CBC     Status: Abnormal   Collection Time    07/05/12  4:20 AM      Result Value Range   WBC 17.0 (*) 4.0 - 10.5 K/uL   RBC 2.63 (*) 4.22 - 5.81 MIL/uL   Hemoglobin 8.0 (*) 13.0 - 17.0 g/dL   HCT 40.9 (*) 81.1 - 91.4 %   MCV 95.8  78.0 - 100.0 fL   MCH 30.4  26.0 - 34.0 pg   MCHC 31.7  30.0 - 36.0 g/dL    RDW 78.2 (*) 95.6 - 15.5 %   Platelets 172  150 - 400 K/uL  BASIC METABOLIC PANEL     Status: Abnormal   Collection Time    07/05/12  4:20 AM      Result Value Range   Sodium 137  135 - 145 mEq/L   Potassium 4.5  3.5 - 5.1 mEq/L   Chloride 99  96 - 112 mEq/L   CO2 25  19 - 32 mEq/L   Glucose, Bld 121 (*) 70 - 99 mg/dL   BUN 38 (*) 6 - 23 mg/dL   Creatinine, Ser 21.30 (*) 0.50 - 1.35 mg/dL   Calcium 8.7  8.4 - 86.5 mg/dL   GFR calc non  Af Amer 5 (*) >90 mL/min   GFR calc Af Amer 5 (*) >90 mL/min   Comment:            The eGFR has been calculated     using the CKD EPI equation.     This calculation has not been     validated in all clinical     situations.     eGFR's persistently     <90 mL/min signify     possible Chronic Kidney Disease.  MAGNESIUM     Status: None   Collection Time    07/05/12  4:20 AM      Result Value Range   Magnesium 2.3  1.5 - 2.5 mg/dL  PHOSPHORUS     Status: Abnormal   Collection Time    07/05/12  4:20 AM      Result Value Range   Phosphorus 5.5 (*) 2.3 - 4.6 mg/dL  VANCOMYCIN, RANDOM     Status: None   Collection Time    07/05/12  4:24 AM      Result Value Range   Vancomycin Rm 26.0     Comment:            Random Vancomycin therapeutic     range is dependent on dosage and     time of specimen collection.     A peak range is 20.0-40.0 ug/mL     A trough range is 5.0-15.0 ug/mL             POCT I-STAT 3, BLOOD GAS (G3+)     Status: Abnormal   Collection Time    07/05/12  5:42 AM      Result Value Range   pH, Arterial 7.238 (*) 7.350 - 7.450   pCO2 arterial 59.4 (*) 35.0 - 45.0 mmHg   pO2, Arterial 122.0 (*) 80.0 - 100.0 mmHg   Bicarbonate 25.3 (*) 20.0 - 24.0 mEq/L   TCO2 27  0 - 100 mmol/L   O2 Saturation 98.0     Acid-base deficit 2.0  0.0 - 2.0 mmol/L   Patient temperature 98.6 F     Collection site RADIAL, ALLEN'S TEST ACCEPTABLE     Sample type ARTERIAL     Comment NOTIFIED PHYSICIAN    HEPATITIS B SURFACE ANTIGEN     Status:  None   Collection Time    07/05/12  2:00 PM      Result Value Range   Hepatitis B Surface Ag NEGATIVE  NEGATIVE  HEPATITIS B CORE ANTIBODY, TOTAL     Status: None   Collection Time    07/05/12  2:00 PM      Result Value Range   Hep B Core Total Ab NEGATIVE  NEGATIVE  HEPATITIS B SURFACE ANTIBODY     Status: Abnormal   Collection Time    07/05/12  2:00 PM      Result Value Range   Hep B S Ab Reactive (*) NONREACTIVE  BASIC METABOLIC PANEL     Status: Abnormal   Collection Time    07/06/12  4:40 AM      Result Value Range   Sodium 138  135 - 145 mEq/L   Potassium 3.9  3.5 - 5.1 mEq/L   Chloride 100  96 - 112 mEq/L   CO2 29  19 - 32 mEq/L   Glucose, Bld 95  70 - 99 mg/dL   BUN 28 (*) 6 - 23 mg/dL   Creatinine, Ser 3.08 (*) 0.50 -  1.35 mg/dL   Calcium 8.5  8.4 - 16.1 mg/dL   GFR calc non Af Amer 7 (*) >90 mL/min   GFR calc Af Amer 8 (*) >90 mL/min   Comment:            The eGFR has been calculated     using the CKD EPI equation.     This calculation has not been     validated in all clinical     situations.     eGFR's persistently     <90 mL/min signify     possible Chronic Kidney Disease.  CBC     Status: Abnormal   Collection Time    07/06/12  4:40 AM      Result Value Range   WBC 11.7 (*) 4.0 - 10.5 K/uL   RBC 2.41 (*) 4.22 - 5.81 MIL/uL   Hemoglobin 7.2 (*) 13.0 - 17.0 g/dL   HCT 09.6 (*) 04.5 - 40.9 %   MCV 96.3  78.0 - 100.0 fL   MCH 29.9  26.0 - 34.0 pg   MCHC 31.0  30.0 - 36.0 g/dL   RDW 81.1 (*) 91.4 - 78.2 %   Platelets 150  150 - 400 K/uL  PREPARE RBC (CROSSMATCH)     Status: None   Collection Time    07/06/12 12:15 PM      Result Value Range   Order Confirmation ORDER PROCESSED BY BLOOD BANK    TYPE AND SCREEN     Status: None   Collection Time    07/06/12 12:15 PM      Result Value Range   ABO/RH(D) A POS     Antibody Screen NEG     Sample Expiration 07/09/2012     Unit Number [id number]     Blood Component Type RED CELLS,LR     Unit  division 00     Status of Unit ALLOCATED     Transfusion Status OK TO TRANSFUSE     Crossmatch Result Compatible    ABO/RH     Status: None   Collection Time    07/06/12 12:15 PM      Result Value Range   ABO/RH(D) A POS        Component Value Date/Time   SDES FLUID PERITONEAL 07/02/2012 0557   SPECREQUEST Normal 07/02/2012 0557   CULT  Value: RARE YEAST Note: CRITICAL RESULT CALLED TO, READ BACK BY AND VERIFIED WITH: NURSE AMARUGE@1431  ON [id number] BY [facility] 07/02/2012 0557   REPTSTATUS PENDING 07/02/2012 0557   Dg Abd 1 View  07/05/2012  *RADIOLOGY REPORT*  Clinical Data: Peritoneal dialysis catheter not draining.  Evaluate for position of catheter.  Question constipation.  ABDOMEN - 1 VIEW  Comparison: CT of 07/01/2012  Findings: Multiple supine views, performed portably.  These are moderate to markedly degraded by patient body habitus.  Gas within normal caliber stomach.  Increased density within the right side abdomen is likely due to contrast within the ascending colon from prior CT.  A large bore catheter projects over the midabdomen.  This could represent the external course of the dialysis catheter.  The remainder of the catheter is not confidently identified.  There is a moderate amount of colonic stool.  The low pelvis is excluded.  Images are motion degraded.  IMPRESSION: Moderately degraded exam, secondary multiple factors detailed above.  Lack of confident visualization of the dialysis catheter.  Its external course may be identified, terminating over the left paracentral abdomen.  Moderate amount  of ascending colonic stool.  Low pelvis excluded.   Original Report Authenticated By: Jeronimo Greaves, M.D.    Dg Chest Port 1 View  07/06/2012  *RADIOLOGY REPORT*  Clinical Data: Respiratory failure, follow  PORTABLE CHEST - 1 VIEW  Comparison: A portable chest x-ray of 07/05/2012  Findings: There is little change in poor aeration with mild pulmonary vascular congestion remaining.  Cardiomegaly is  stable. Left and right central venous lines are unchanged in position.  No pneumothorax is noted.  IMPRESSION: Little change in poor aeration with cardiomegaly and pulmonary vascular congestion.   Original Report Authenticated By: Dwyane Dee, M.D.    Dg Chest Port 1 View  07/05/2012  *RADIOLOGY REPORT*  Clinical Data: Increased respiratory rate.  Question volume overload.  PORTABLE CHEST - 1 VIEW  Comparison: Plain film chest 07/04/2012.  Findings: Bilateral internal jugular central venous catheters are unchanged.  Lung volumes are low with basilar atelectasis, also unchanged.  No pulmonary edema is identified.  There is cardiomegaly.  IMPRESSION:  No acute abnormality with bibasilar atelectasis and cardiomegaly again seen.   Original Report Authenticated By: Holley Dexter, M.D.    Dg Chest Port 1 View  07/04/2012  *RADIOLOGY REPORT*  Clinical Data: Acute dyspnea.  PORTABLE CHEST - 1 VIEW  Comparison: 07/03/2012 and 07/02/2012  Findings: Central catheters are unchanged.  NG tube and endotracheal tube have been removed.  Chronic cardiomegaly. Pulmonary vascularity is within normal limits considering the shallow inspiration.  Slight atelectasis at the bases is essentially unchanged.  IMPRESSION: No acute abnormalities.  Slight atelectasis, stable.   Original Report Authenticated By: Francene Boyers, M.D.      Recent Results (from the past 720 hour(s))  BODY FLUID CULTURE     Status: None   Collection Time    06/19/12  8:22 PM      Result Value Range Status   Specimen Description PERITONEAL FLUID   Final   Special Requests NONE   Final   Gram Stain     Final   Value: NO WBC SEEN     NO ORGANISMS SEEN   Culture NO GROWTH 3 DAYS   Final   Report Status 06/23/2012 FINAL   Final  MRSA PCR SCREENING     Status: None   Collection Time    07/02/12 12:17 AM      Result Value Range Status   MRSA by PCR NEGATIVE  NEGATIVE Final   Comment:            The GeneXpert MRSA Assay (FDA     approved for  NASAL specimens     only), is one component of a     comprehensive MRSA colonization     surveillance program. It is not     intended to diagnose MRSA     infection nor to guide or     monitor treatment for     MRSA infections.  CULTURE, BLOOD (ROUTINE X 2)     Status: None   Collection Time    07/02/12  3:30 AM      Result Value Range Status   Specimen Description BLOOD LEFT HAND   Final   Special Requests BOTTLES DRAWN AEROBIC ONLY [facility]   Final   Culture  Setup Time 07/02/2012 07:34   Final   Culture     Final   Value:        BLOOD CULTURE RECEIVED NO GROWTH TO DATE CULTURE WILL BE HELD FOR 5 DAYS BEFORE ISSUING A  FINAL NEGATIVE REPORT   Report Status PENDING   Incomplete  CULTURE, BLOOD (ROUTINE X 2)     Status: None   Collection Time    07/02/12  3:45 AM      Result Value Range Status   Specimen Description BLOOD LEFT HAND   Final   Special Requests BOTTLES DRAWN AEROBIC ONLY [facility]   Final   Culture  Setup Time 07/02/2012 07:34   Final   Culture     Final   Value:        BLOOD CULTURE RECEIVED NO GROWTH TO DATE CULTURE WILL BE HELD FOR 5 DAYS BEFORE ISSUING A FINAL NEGATIVE REPORT   Report Status PENDING   Incomplete  BODY FLUID CULTURE     Status: None   Collection Time    07/02/12  5:57 AM      Result Value Range Status   Specimen Description FLUID PERITONEAL   Final   Special Requests Normal   Final   Gram Stain     Final   Value: ABUNDANT WBC PRESENT,BOTH PMN AND MONONUCLEAR     NO ORGANISMS SEEN   Culture     Final   Value: RARE YEAST     Note: CRITICAL RESULT CALLED TO, READ BACK BY AND VERIFIED WITH: NURSE AMARUGE@1431  ON [id number] BY [facility]   Report Status PENDING   Incomplete     Impression/Recommendation  52 year old with fungal peritonitis associated with PD catheter  #1 Fungal peritonitis: --start mycafungin --fu speciation of organism if C albicans can change to fluconazole since org likely to be susceptible. If C glabrata would worry about azole R and  continue micafungin --agree with removal of PD catheter --would dc vancomycin today and then de-escalate zosyn the next day  #2 Screening: re-check hiv,    Thank you so much for this interesting consult  [facility] [phone] (pager) [phone] (office) 07/06/2012, 5:20 PM  Paulette Blanch [place] 07/06/2012, 5:20 PM

## 2012-07-06 NOTE — Progress Notes (Signed)
[facility] ROUNDING NOTE   Subjective:   Interval History: feeling better, abd pain improving, ate breakfast, less SOB Objective:  Vital signs in last 24 hours:  Temp:  [98.1 F (36.7 C)-100.5 F (38.1 C)] 100.5 F (38.1 C) (04/18 0800) Pulse Rate:  [77-103] 97 (04/18 0817) Resp:  [20-33] 28 (04/18 0817) BP: (81-129)/(43-90) 91/43 mmHg (04/18 0700) SpO2:  [90 %-100 %] 97 % (04/18 0817) FiO2 (%):  [5 %-70 %] 5 % (04/18 0700) Weight:  [142 kg (313 lb 0.9 oz)-143.9 kg (317 lb 3.9 oz)] 142 kg (313 lb 0.9 oz) (04/18 0500)  Weight change: -2.7 kg (-5 lb 15.2 oz) Filed Weights   07/05/12 0500 07/05/12 1407 07/06/12 0500  Weight: 144.5 kg (318 lb 9 oz) 143.9 kg (317 lb 3.9 oz) 142 kg (313 lb 0.9 oz)    Intake/Output: I/O last 3 completed shifts: In: 663 [P.O.:510; I.V.:53; IV Piggyback:100] Out: 1159 [Other:1159]   Intake/Output this shift:    Gen: looks better, not tachypneic today CVS- RRR systolic murmur Rigth IJ RS- CTA extubated ABD- BS present soft distended, PD cath exit site without drainage, erythema or edema, tunnel not edematous either EXT- no edema   Basic Metabolic Panel:  Recent Labs Lab 07/02/12 0200 07/03/12 0358 07/04/12 0445 07/04/12 2300 07/05/12 0420 07/06/12 0440  NA 132* 136 139 137 137 138  K 3.4* 3.0* 4.4 4.1 4.5 3.9  CL 87* 96 101 99 99 100  CO2 23 26 27 25 25 29   GLUCOSE 111* 103* 101* 110* 121* 95  BUN 65* 47* 29* 34* 38* 28*  CREATININE 17.82* 12.57* 8.36* 10.20* 11.03* 7.93*  CALCIUM 8.6 8.6 8.5 8.5 8.7 8.5  MG 1.9  --  2.5  --  2.3  --   PHOS  --  5.4* 5.7*  --  5.5*  --     Liver Function Tests:  Recent Labs Lab 07/01/12 1627 07/03/12 0358 07/04/12 0445  AST 30  --   --   ALT 33  --   --   ALKPHOS 58  --   --   BILITOT 0.3  --   --   PROT 7.6  --   --   ALBUMIN 2.6* 2.2* 2.3*    Recent Labs Lab 07/01/12 1627  LIPASE 41   No results found for this basename: AMMONIA,  in the last 168  hours  CBC:  Recent Labs Lab 07/01/12 1627  07/03/12 0358 07/04/12 0445 07/04/12 2300 07/05/12 0420 07/06/12 0440  WBC 8.3  < > 7.5 9.2 12.0* 17.0* 11.7*  NEUTROABS 6.3  --   --   --   --   --   --   HGB 9.9*  < > 8.6* 8.5* 8.5* 8.0* 7.2*  HCT 30.3*  < > 26.3* 28.0* 27.4* 25.2* 23.2*  MCV 93.2  < > 91.6 96.6 96.1 95.8 96.3  PLT 178  < > 153 171 218 172 150  < > = values in this interval not displayed.  Cardiac Enzymes:  Recent Labs Lab 07/01/12 2123 07/04/12 2300  TROPONINI <0.30 <0.30    BNP: No components found with this basename: POCBNP,   CBG:  Recent Labs Lab 07/02/12 0722 07/02/12 1146 07/04/12 2305  GLUCAP 112* 89 98    Microbiology: Results for orders placed during the hospital encounter of 07/01/12  MRSA PCR SCREENING     Status: None   Collection Time    07/02/12 12:17 AM      Result  Value Range Status   MRSA by PCR NEGATIVE  NEGATIVE Final   Comment:            The GeneXpert MRSA Assay (FDA     approved for NASAL specimens     only), is one component of a     comprehensive MRSA colonization     surveillance program. It is not     intended to diagnose MRSA     infection nor to guide or     monitor treatment for     MRSA infections.  CULTURE, BLOOD (ROUTINE X 2)     Status: None   Collection Time    07/02/12  3:30 AM      Result Value Range Status   Specimen Description BLOOD LEFT HAND   Final   Special Requests BOTTLES DRAWN AEROBIC ONLY 6CC   Final   Culture  Setup Time 07/02/2012 07:34   Final   Culture     Final   Value:        BLOOD CULTURE RECEIVED NO GROWTH TO DATE CULTURE WILL BE HELD FOR 5 DAYS BEFORE ISSUING A FINAL NEGATIVE REPORT   Report Status PENDING   Incomplete  CULTURE, BLOOD (ROUTINE X 2)     Status: None   Collection Time    07/02/12  3:45 AM      Result Value Range Status   Specimen Description BLOOD LEFT HAND   Final   Special Requests BOTTLES DRAWN AEROBIC ONLY 3CC   Final   Culture  Setup Time 07/02/2012 07:34    Final   Culture     Final   Value:        BLOOD CULTURE RECEIVED NO GROWTH TO DATE CULTURE WILL BE HELD FOR 5 DAYS BEFORE ISSUING A FINAL NEGATIVE REPORT   Report Status PENDING   Incomplete  BODY FLUID CULTURE     Status: None   Collection Time    07/02/12  5:57 AM      Result Value Range Status   Specimen Description FLUID PERITONEAL   Final   Special Requests Normal   Final   Gram Stain     Final   Value: ABUNDANT WBC PRESENT,BOTH PMN AND MONONUCLEAR     NO ORGANISMS SEEN   Culture NO GROWTH 1 DAY   Final   Report Status PENDING   Incomplete    Coagulation Studies: No results found for this basename: LABPROT, INR,  in the last 72 hours  Urinalysis: No results found for this basename: COLORURINE, APPERANCEUR, LABSPEC, PHURINE, GLUCOSEU, HGBUR, BILIRUBINUR, KETONESUR, PROTEINUR, UROBILINOGEN, NITRITE, LEUKOCYTESUR,  in the last 72 hours    Imaging: Dg Abd 1 View  07/05/2012  *RADIOLOGY REPORT*  Clinical Data: Peritoneal dialysis catheter not draining.  Evaluate for position of catheter.  Question constipation.  ABDOMEN - 1 VIEW  Comparison: CT of 07/01/2012  Findings: Multiple supine views, performed portably.  These are moderate to markedly degraded by patient body habitus.  Gas within normal caliber stomach.  Increased density within the right side abdomen is likely due to contrast within the ascending colon from prior CT.  A large bore catheter projects over the midabdomen.  This could represent the external course of the dialysis catheter.  The remainder of the catheter is not confidently identified.  There is a moderate amount of colonic stool.  The low pelvis is excluded.  Images are motion degraded.  IMPRESSION: Moderately degraded exam, secondary multiple factors detailed above.  Lack of  confident visualization of the dialysis catheter.  Its external course may be identified, terminating over the left paracentral abdomen.  Moderate amount of ascending colonic stool.  Low pelvis  excluded.   Original Report Authenticated By: Jeronimo Greaves, M.D.    Dg Chest Port 1 View  07/06/2012  *RADIOLOGY REPORT*  Clinical Data: Respiratory failure, follow  PORTABLE CHEST - 1 VIEW  Comparison: A portable chest x-ray of 07/05/2012  Findings: There is little change in poor aeration with mild pulmonary vascular congestion remaining.  Cardiomegaly is stable. Left and right central venous lines are unchanged in position.  No pneumothorax is noted.  IMPRESSION: Little change in poor aeration with cardiomegaly and pulmonary vascular congestion.   Original Report Authenticated By: Dwyane Dee, M.D.    Dg Chest Port 1 View  07/05/2012  *RADIOLOGY REPORT*  Clinical Data: Increased respiratory rate.  Question volume overload.  PORTABLE CHEST - 1 VIEW  Comparison: Plain film chest 07/04/2012.  Findings: Bilateral internal jugular central venous catheters are unchanged.  Lung volumes are low with basilar atelectasis, also unchanged.  No pulmonary edema is identified.  There is cardiomegaly.  IMPRESSION:  No acute abnormality with bibasilar atelectasis and cardiomegaly again seen.   Original Report Authenticated By: Holley Dexter, M.D.    Dg Chest Port 1 View  07/04/2012  *RADIOLOGY REPORT*  Clinical Data: Acute dyspnea.  PORTABLE CHEST - 1 VIEW  Comparison: 07/03/2012 and 07/02/2012  Findings: Central catheters are unchanged.  NG tube and endotracheal tube have been removed.  Chronic cardiomegaly. Pulmonary vascularity is within normal limits considering the shallow inspiration.  Slight atelectasis at the bases is essentially unchanged.  IMPRESSION: No acute abnormalities.  Slight atelectasis, stable.   Original Report Authenticated By: Francene Boyers, M.D.      Medications:   . dialysis solution 1.5% low-MG/low-CA    . dialysis solution 2.5% low-MG/low-CA     . antiseptic oral rinse  15 mL Mouth Rinse QID  . aspirin  325 mg Per Tube Daily  . calcitRIOL  0.25 mcg Oral Custom  . dialysis solution  for CAPD/CCPD [facility])   Peritoneal Dialysis Once in dialysis  . dialysis solution 2.5% low-MG/low-CA   Intraperitoneal Once in dialysis  . lanthanum  1,000 mg Oral TID WC  . pantoprazole (PROTONIX) IV  40 mg Intravenous QHS  . piperacillin-tazobactam (ZOSYN)  IV  2.25 g Intravenous Q8H  . sodium chloride  3 mL Intravenous Q12H   sodium chloride, sodium chloride, sodium chloride, sodium chloride, sodium chloride, clonazePAM, diphenhydrAMINE, feeding supplement (NEPRO CARB STEADY), heparin, heparin, heparin, lidocaine (PF), lidocaine-prilocaine, pentafluoroprop-tetrafluoroeth, simethicone, sodium chloride  Assessment/ Plan:  1. Septic shock / abd pain / peritonitis- improving with empiric abx  2. ESRD- usual modality if CCPD, on HD now due to PD cath malfunction. HD today then hold til Monday. At dry wt 3. PD cath malfunction- likely due to fibrin from peritonitis. Flushes but doesn't drain. CT showed good position above bladder in pelvis. Moderate constipation. Give laxative, consider TPA 4. Resp- improved, cxr clear 5. Peritonitis, cx negative- on vanc and zosyn, clinical improvement. Continue for now. Will need 2-3 wks abx total, will adjust abx when we know whether PD cath remains in or not 6. 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 HTN/volume- BP low here. Was not on any BP meds at home [facility] listed, but pt had discontinued). Is right at home dry weight of  142kg (313lbs) per pt   [name]  MD [phone] pgr    [phone] cell 07/06/2012, 10:33 AM

## 2012-07-06 NOTE — Progress Notes (Signed)
Called to hemodialysis to evaluate patient for sats 87-90% on 5L nasal canulla, after HD treatment.  Patient placed on 50% VM with sats increasing to 96%.  Patient is awake and alert, RN at bedside, rapid response present.  HR 102, BBS diminished, upper airway sounds.

## 2012-07-07 DIAGNOSIS — T8571XA Infection and inflammatory reaction due to peritoneal dialysis catheter, initial encounter: Secondary | ICD-10-CM

## 2012-07-07 LAB — GLUCOSE, CAPILLARY
Glucose-Capillary: 107 mg/dL — ABNORMAL HIGH (ref 70–99)
Glucose-Capillary: 102 mg/dL — ABNORMAL HIGH (ref 70–99)

## 2012-07-07 MED ORDER — VANCOMYCIN HCL 1000 MG IV SOLR: 1250.0000 mg | Freq: Once | INTRAVENOUS | Status: DC

## 2012-07-07 NOTE — Progress Notes (Signed)
[facility] ROUNDING NOTE   Subjective:   Interval History: transferred to floor but had another episode of tachypnea and desaturation, moved back to SDU status on 2900. This am without complaints Objective:  Vital signs in last 24 hours:  Temp:  [96.7 F (35.9 C)-98.9 F (37.2 C)] 98 F (36.7 C) (04/19 0400) Pulse Rate:  [86-106] 97 (04/19 0700) Resp:  [21-34] 31 (04/19 0700) BP: (79-160)/(46-78) 114/55 mmHg (04/19 0400) SpO2:  [88 %-100 %] 98 % (04/19 0700) FiO2 (%):  [50 %] 50 % (04/18 2145) Weight:  [143.5 kg (316 lb 5.8 oz)] 143.5 kg (316 lb 5.8 oz) (04/18 1634)  Weight change: -0.4 kg (-14.1 oz) Filed Weights   07/05/12 1407 07/06/12 0500 07/06/12 1634  Weight: 143.9 kg (317 lb 3.9 oz) 142 kg (313 lb 0.9 oz) 143.5 kg (316 lb 5.8 oz)    Intake/Output: I/O last 3 completed shifts: In: 1510 [P.O.:960; Blood:350; IV Piggyback:200] Out: 650 [Other:650]   Intake/Output this shift:    Gen: calm, RR 31, 94% SaO2 on nasal O2 CVS- RRR systolic murmur R IJ temp cath 3-lumen RS- CTA extubated ABD- BS present soft distended, PD cath exit site without drainage, erythema or edema EXT- no edema   Basic Metabolic Panel:  Recent Labs Lab 07/02/12 0200 07/03/12 0358 07/04/12 0445 07/04/12 2300 07/05/12 0420 07/06/12 0440  NA 132* 136 139 137 137 138  K 3.4* 3.0* 4.4 4.1 4.5 3.9  CL 87* 96 101 99 99 100  CO2 23 26 27 25 25 29   GLUCOSE 111* 103* 101* 110* 121* 95  BUN 65* 47* 29* 34* 38* 28*  CREATININE 17.82* 12.57* 8.36* 10.20* 11.03* 7.93*  CALCIUM 8.6 8.6 8.5 8.5 8.7 8.5  MG 1.9  --  2.5  --  2.3  --   PHOS  --  5.4* 5.7*  --  5.5*  --     Liver Function Tests:  Recent Labs Lab 07/01/12 1627 07/03/12 0358 07/04/12 0445  AST 30  --   --   ALT 33  --   --   ALKPHOS 58  --   --   BILITOT 0.3  --   --   PROT 7.6  --   --   ALBUMIN 2.6* 2.2* 2.3*    Recent Labs Lab 07/01/12 1627  LIPASE 41   No results found for this basename: AMMONIA,   in the last 168 hours  CBC:  Recent Labs Lab 07/01/12 1627  07/03/12 0358 07/04/12 0445 07/04/12 2300 07/05/12 0420 07/06/12 0440  WBC 8.3  < > 7.5 9.2 12.0* 17.0* 11.7*  NEUTROABS 6.3  --   --   --   --   --   --   HGB 9.9*  < > 8.6* 8.5* 8.5* 8.0* 7.2*  HCT 30.3*  < > 26.3* 28.0* 27.4* 25.2* 23.2*  MCV 93.2  < > 91.6 96.6 96.1 95.8 96.3  PLT 178  < > 153 171 218 172 150  < > = values in this interval not displayed.  Cardiac Enzymes:  Recent Labs Lab 07/01/12 2123 07/04/12 2300  TROPONINI <0.30 <0.30    BNP: No components found with this basename: POCBNP,   CBG:  Recent Labs Lab 07/02/12 0722 07/02/12 1146 07/04/12 2305  GLUCAP 112* 89 98    Microbiology: Results for orders placed during the hospital encounter of 07/01/12  MRSA PCR SCREENING     Status: None   Collection Time    07/02/12 12:17  AM      Result Value Range Status   MRSA by PCR NEGATIVE  NEGATIVE Final   Comment:            The GeneXpert MRSA Assay (FDA     approved for NASAL specimens     only), is one component of a     comprehensive MRSA colonization     surveillance program. It is not     intended to diagnose MRSA     infection nor to guide or     monitor treatment for     MRSA infections.  CULTURE, BLOOD (ROUTINE X 2)     Status: None   Collection Time    07/02/12  3:30 AM      Result Value Range Status   Specimen Description BLOOD LEFT HAND   Final   Special Requests BOTTLES DRAWN AEROBIC ONLY 6CC   Final   Culture  Setup Time 07/02/2012 07:34   Final   Culture     Final   Value:        BLOOD CULTURE RECEIVED NO GROWTH TO DATE CULTURE WILL BE HELD FOR 5 DAYS BEFORE ISSUING A FINAL NEGATIVE REPORT   Report Status PENDING   Incomplete  CULTURE, BLOOD (ROUTINE X 2)     Status: None   Collection Time    07/02/12  3:45 AM      Result Value Range Status   Specimen Description BLOOD LEFT HAND   Final   Special Requests BOTTLES DRAWN AEROBIC ONLY 3CC   Final   Culture  Setup Time  07/02/2012 07:34   Final   Culture     Final   Value:        BLOOD CULTURE RECEIVED NO GROWTH TO DATE CULTURE WILL BE HELD FOR 5 DAYS BEFORE ISSUING A FINAL NEGATIVE REPORT   Report Status PENDING   Incomplete  BODY FLUID CULTURE     Status: None   Collection Time    07/02/12  5:57 AM      Result Value Range Status   Specimen Description FLUID PERITONEAL   Final   Special Requests Normal   Final   Gram Stain     Final   Value: ABUNDANT WBC PRESENT,BOTH PMN AND MONONUCLEAR     NO ORGANISMS SEEN   Culture     Final   Value: RARE YEAST     Note: CRITICAL RESULT CALLED TO, READ BACK BY AND VERIFIED WITH: NURSE AMARUGE@1431  ON [id number] BY [facility]   Report Status PENDING   Incomplete    Coagulation Studies: No results found for this basename: LABPROT, INR,  in the last 72 hours  Urinalysis: No results found for this basename: COLORURINE, APPERANCEUR, LABSPEC, PHURINE, GLUCOSEU, HGBUR, BILIRUBINUR, KETONESUR, PROTEINUR, UROBILINOGEN, NITRITE, LEUKOCYTESUR,  in the last 72 hours    Imaging: Dg Abd 1 View  07/05/2012  *RADIOLOGY REPORT*  Clinical Data: Peritoneal dialysis catheter not draining.  Evaluate for position of catheter.  Question constipation.  ABDOMEN - 1 VIEW  Comparison: CT of 07/01/2012  Findings: Multiple supine views, performed portably.  These are moderate to markedly degraded by patient body habitus.  Gas within normal caliber stomach.  Increased density within the right side abdomen is likely due to contrast within the ascending colon from prior CT.  A large bore catheter projects over the midabdomen.  This could represent the external course of the dialysis catheter.  The remainder of the catheter is not confidently identified.  There  is a moderate amount of colonic stool.  The low pelvis is excluded.  Images are motion degraded.  IMPRESSION: Moderately degraded exam, secondary multiple factors detailed above.  Lack of confident visualization of the dialysis catheter.  Its external  course may be identified, terminating over the left paracentral abdomen.  Moderate amount of ascending colonic stool.  Low pelvis excluded.   Original Report Authenticated By: Jeronimo Greaves, M.D.    Dg Chest Port 1 View  07/06/2012  *RADIOLOGY REPORT*  Clinical Data: Respiratory failure, follow  PORTABLE CHEST - 1 VIEW  Comparison: A portable chest x-ray of 07/05/2012  Findings: There is little change in poor aeration with mild pulmonary vascular congestion remaining.  Cardiomegaly is stable. Left and right central venous lines are unchanged in position.  No pneumothorax is noted.  IMPRESSION: Little change in poor aeration with cardiomegaly and pulmonary vascular congestion.   Original Report Authenticated By: Dwyane Dee, M.D.    Dg Chest Port 1 View  07/05/2012  *RADIOLOGY REPORT*  Clinical Data: Increased respiratory rate.  Question volume overload.  PORTABLE CHEST - 1 VIEW  Comparison: Plain film chest 07/04/2012.  Findings: Bilateral internal jugular central venous catheters are unchanged.  Lung volumes are low with basilar atelectasis, also unchanged.  No pulmonary edema is identified.  There is cardiomegaly.  IMPRESSION:  No acute abnormality with bibasilar atelectasis and cardiomegaly again seen.   Original Report Authenticated By: Holley Dexter, M.D.      Medications:     . alteplase  2 mg Intracatheter Once  . antiseptic oral rinse  15 mL Mouth Rinse QID  . aspirin  325 mg Per Tube Daily  . calcitRIOL  0.25 mcg Oral Custom  . darbepoetin (ARANESP) injection - DIALYSIS  100 mcg Intravenous Q Fri-HD  . dialysis solution for CAPD/CCPD [facility])   Peritoneal Dialysis Once in dialysis  . dialysis solution 2.5% low-MG/low-CA   Intraperitoneal Once in dialysis  . lanthanum  1,000 mg Oral TID WC  . micafungin (MYCAMINE) IV  100 mg Intravenous Daily  . pantoprazole  40 mg Oral Q1200  . piperacillin-tazobactam (ZOSYN)  IV  2.25 g Intravenous Q8H  . sodium chloride  3 mL Intravenous Q12H    sodium chloride, sodium chloride, sodium chloride, sodium chloride, sodium chloride, clonazePAM, diphenhydrAMINE, heparin, oxyCODONE-acetaminophen, simethicone, sodium chloride  Assessment/ Plan:  1. Septic shock / abd pain /fungal peritonitis- persistent episodes of tachypnea/desaturation probably due to abdominal infection/SIRS. Needs PD cath removed, will talk with surgery. Cont Micafungin per ID rec's, taper other abx 2. ESRD- usual modality is CCPD, on HD now due to #1. Last HD Fri, next HD Monday or sooner if needed 3. Resp- improved, cxr clear 4. Peritonitis, cx negative- on vanc and zosyn, clinical improvement. Continue for now. Will need 2-3 wks abx total, will adjust abx when we know whether PD cath remains in or not 5. 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 HTN/volume- BP low here. Was not on any BP meds at home [facility] listed, but pt had discontinued). Is 1 kg over dry wt   [name]  MD [phone] pgr    [phone] cell 07/07/2012, 9:06 AM

## 2012-07-07 NOTE — Progress Notes (Signed)
[facility]  Day #2 micafungin Day # 6 zosyn  Subjective: No new complaints   Antibiotics:  Anti-infectives   Start     Dose/Rate Route Frequency Ordered Stop   07/06/12 1545  micafungin (MYCAMINE) 100 mg in sodium chloride 0.9 % 100 mL IVPB     100 mg 100 mL/hr over 1 Hours Intravenous Daily 07/06/12 1541     07/05/12 1800  vancomycin (VANCOCIN) IVPB 750 mg/150 ml premix  Status:  Discontinued     750 mg 150 mL/hr over 60 Minutes Intravenous  Once 07/05/12 1352 07/05/12 1355   07/05/12 1600  vancomycin (VANCOCIN) IVPB 750 mg/150 ml premix     750 mg 150 mL/hr over 60 Minutes Intravenous  Once 07/05/12 1355 07/05/12 1956   07/04/12 2100  piperacillin-tazobactam (ZOSYN) IVPB 2.25 g     2.25 g 100 mL/hr over 30 Minutes Intravenous 3 times per day 07/04/12 1120     07/02/12 2200  piperacillin-tazobactam (ZOSYN) IVPB 3.375 g  Status:  Discontinued     3.375 g 12.5 mL/hr over 240 Minutes Intravenous Every 6 hours 07/02/12 1703 07/04/12 1120   07/02/12 2000  vancomycin (VANCOCIN) 1,500 mg in sodium chloride 0.9 % 500 mL IVPB  Status:  Discontinued     1,500 mg 250 mL/hr over 120 Minutes Intravenous Every 24 hours 07/02/12 1708 07/03/12 1245   07/02/12 0800  piperacillin-tazobactam (ZOSYN) IVPB 2.25 g  Status:  Discontinued     2.25 g 100 mL/hr over 30 Minutes Intravenous Every 8 hours 07/02/12 0054 07/02/12 1703   07/02/12 0100  vancomycin (VANCOCIN) 2,500 mg in sodium chloride 0.9 % 500 mL IVPB     2,500 mg 250 mL/hr over 120 Minutes Intravenous Every 12 hours 07/02/12 0054 07/02/12 0409   07/02/12 0100  piperacillin-tazobactam (ZOSYN) IVPB 2.25 g     2.25 g 100 mL/hr over 30 Minutes Intravenous  Once 07/02/12 0054 07/02/12 0519   07/01/12 2245  cefTRIAXone (ROCEPHIN) 1 g in dextrose 5 % 50 mL IVPB     1 g 100 mL/hr over 30 Minutes Intravenous  Once 07/01/12 2236 07/01/12 2336   07/01/12 2245  azithromycin (ZITHROMAX) 500 mg in dextrose 5 % 250 mL  IVPB  Status:  Discontinued     500 mg 250 mL/hr over 60 Minutes Intravenous  Once 07/01/12 2236 07/02/12 0218      Medications: Scheduled Meds: . alteplase  2 mg Intracatheter Once  . antiseptic oral rinse  15 mL Mouth Rinse QID  . aspirin  325 mg Per Tube Daily  . calcitRIOL  0.25 mcg Oral Custom  . darbepoetin (ARANESP) injection - DIALYSIS  100 mcg Intravenous Q Fri-HD  . dialysis solution for CAPD/CCPD [facility])   Peritoneal Dialysis Once in dialysis  . dialysis solution 2.5% low-MG/low-CA   Intraperitoneal Once in dialysis  . lanthanum  1,000 mg Oral TID WC  . micafungin (MYCAMINE) IV  100 mg Intravenous Daily  . pantoprazole  40 mg Oral Q1200  . piperacillin-tazobactam (ZOSYN)  IV  2.25 g Intravenous Q8H  . sodium chloride  3 mL Intravenous Q12H   Continuous Infusions:  PRN Meds:.sodium chloride, sodium chloride, sodium chloride, sodium chloride, sodium chloride, clonazePAM, diphenhydrAMINE, heparin, oxyCODONE-acetaminophen, simethicone, sodium chloride   Objective: Weight change: -14.1 oz (-0.4 kg)  Intake/Output Summary (Last 24 hours) at 07/07/12 1210 Last data filed at 07/07/12 1005  Gross per 24 hour  Intake    940 ml  Output  650 ml  Net    290 ml   Blood pressure 134/71, pulse 95, temperature 98.5 F (36.9 C), temperature source Oral, resp. rate 28, height 6' (1.829 m), weight 316 lb 5.8 oz (143.5 kg), SpO2 93.00%. Temp:  [96.7 F (35.9 C)-98.7 F (37.1 C)] 98.5 F (36.9 C) (04/19 0929) Pulse Rate:  [86-106] 95 (04/19 1000) Resp:  [21-34] 28 (04/19 1000) BP: (89-160)/(49-78) 134/71 mmHg (04/19 1000) SpO2:  [88 %-99 %] 93 % (04/19 1000) FiO2 (%):  [50 %] 50 % (04/18 2145) Weight:  [316 lb 5.8 oz (143.5 kg)] 316 lb 5.8 oz (143.5 kg) (04/18 1634)  Physical Exam: General: Alert and awake, oriented x3, not in any acute distress.  HEENT: anicteric sclera, pupils reactive to light and accommodation, EOMI, oropharynx clear and without exudate, neck with HD  catheter  CVS regular rate, normal r, no murmur rubs or gallops  Chest: clear to auscultation bilaterally, no wheezing, rales or rhonchi  Abdomen: Protuberant diffusely distended and tender to palpation throughout without rebound positive bowel sounds  Skin: no rashes  Neuro: nonfocal, strength and sensation intact  Lab Results:  Recent Labs  07/05/12 0420 07/06/12 0440  WBC 17.0* 11.7*  HGB 8.0* 7.2*  HCT 25.2* 23.2*  PLT 172 150    BMET  Recent Labs  07/05/12 0420 07/06/12 0440  NA 137 138  K 4.5 3.9  CL 99 100  CO2 25 29  GLUCOSE 121* 95  BUN 38* 28*  CREATININE 11.03* 7.93*  CALCIUM 8.7 8.5    Micro Results: Recent Results (from the past 240 hour(s))  MRSA PCR SCREENING     Status: None   Collection Time    07/02/12 12:17 AM      Result Value Range Status   MRSA by PCR NEGATIVE  NEGATIVE Final   Comment:            The GeneXpert MRSA Assay (FDA     approved for NASAL specimens     only), is one component of a     comprehensive MRSA colonization     surveillance program. It is not     intended to diagnose MRSA     infection nor to guide or     monitor treatment for     MRSA infections.  CULTURE, BLOOD (ROUTINE X 2)     Status: None   Collection Time    07/02/12  3:30 AM      Result Value Range Status   Specimen Description BLOOD LEFT HAND   Final   Special Requests BOTTLES DRAWN AEROBIC ONLY 6CC   Final   Culture  Setup Time 07/02/2012 07:34   Final   Culture     Final   Value:        BLOOD CULTURE RECEIVED NO GROWTH TO DATE CULTURE WILL BE HELD FOR 5 DAYS BEFORE ISSUING A FINAL NEGATIVE REPORT   Report Status PENDING   Incomplete  CULTURE, BLOOD (ROUTINE X 2)     Status: None   Collection Time    07/02/12  3:45 AM      Result Value Range Status   Specimen Description BLOOD LEFT HAND   Final   Special Requests BOTTLES DRAWN AEROBIC ONLY 3CC   Final   Culture  Setup Time 07/02/2012 07:34   Final   Culture     Final   Value:        BLOOD CULTURE  RECEIVED NO GROWTH TO DATE CULTURE WILL BE  HELD FOR 5 DAYS BEFORE ISSUING A FINAL NEGATIVE REPORT   Report Status PENDING   Incomplete  BODY FLUID CULTURE     Status: None   Collection Time    07/02/12  5:57 AM      Result Value Range Status   Specimen Description FLUID PERITONEAL   Final   Special Requests Normal   Final   Gram Stain     Final   Value: ABUNDANT WBC PRESENT,BOTH PMN AND MONONUCLEAR     NO ORGANISMS SEEN   Culture     Final   Value: RARE YEAST     Note: CRITICAL RESULT CALLED TO, READ BACK BY AND VERIFIED WITH: NURSE AMARUGE@1431  ON [id number] BY [facility]   Report Status PENDING   Incomplete    Studies/Results: Dg Abd 1 View  07/05/2012  *RADIOLOGY REPORT*  Clinical Data: Peritoneal dialysis catheter not draining.  Evaluate for position of catheter.  Question constipation.  ABDOMEN - 1 VIEW  Comparison: CT of 07/01/2012  Findings: Multiple supine views, performed portably.  These are moderate to markedly degraded by patient body habitus.  Gas within normal caliber stomach.  Increased density within the right side abdomen is likely due to contrast within the ascending colon from prior CT.  A large bore catheter projects over the midabdomen.  This could represent the external course of the dialysis catheter.  The remainder of the catheter is not confidently identified.  There is a moderate amount of colonic stool.  The low pelvis is excluded.  Images are motion degraded.  IMPRESSION: Moderately degraded exam, secondary multiple factors detailed above.  Lack of confident visualization of the dialysis catheter.  Its external course may be identified, terminating over the left paracentral abdomen.  Moderate amount of ascending colonic stool.  Low pelvis excluded.   Original Report Authenticated By: Jeronimo Greaves, M.D.    Dg Chest Port 1 View  07/06/2012  *RADIOLOGY REPORT*  Clinical Data: Respiratory failure, follow  PORTABLE CHEST - 1 VIEW  Comparison: A portable chest x-ray of 07/05/2012   Findings: There is little change in poor aeration with mild pulmonary vascular congestion remaining.  Cardiomegaly is stable. Left and right central venous lines are unchanged in position.  No pneumothorax is noted.  IMPRESSION: Little change in poor aeration with cardiomegaly and pulmonary vascular congestion.   Original Report Authenticated By: Dwyane Dee, M.D.    Dg Chest Port 1 View  07/05/2012  *RADIOLOGY REPORT*  Clinical Data: Increased respiratory rate.  Question volume overload.  PORTABLE CHEST - 1 VIEW  Comparison: Plain film chest 07/04/2012.  Findings: Bilateral internal jugular central venous catheters are unchanged.  Lung volumes are low with basilar atelectasis, also unchanged.  No pulmonary edema is identified.  There is cardiomegaly.  IMPRESSION:  No acute abnormality with bibasilar atelectasis and cardiomegaly again seen.   Original Report Authenticated By: Holley Dexter, M.D.       Assessment/Plan: Gary Rivera is a 52 y.o. male with with fungal peritonitis associated with PD catheter   #1 Fungal peritonitis:  --cotninue mycafungin  --fu speciation of organism if C albicans can change to fluconazole since org likely to be susceptible. If C glabrata would worry about azole R and continue micafungin  --agree with removal of PD catheter  --will keep zosyn on board thru surgery tomorrow and then dc it   --c#2 Screening: re-check hiv,    LOS: 6 days   Acey Lav 07/07/2012, 12:10 PM

## 2012-07-07 NOTE — Progress Notes (Signed)
07/06/12 2355  BiPAP/CPAP/SIPAP  BiPAP/CPAP/SIPAP Pt Type Adult  Mask Type Full face mask  Mask Size Large  Set Rate 14 breaths/min  Respiratory Rate 30 breaths/min  IPAP 15 cmH20  EPAP 5 cmH2O  Oxygen Percent 50 %  Minute Ventilation 14  Leak 25  Peak Inspiratory Pressure (PIP) 16  Tidal Volume (Vt) 458  BiPAP/CPAP/SIPAP BiPAP  Patient Home Equipment No  Auto Titrate No  BiPAP/CPAP /SiPAP Vitals  Pulse Rate ! 104  Resp ! 33  SpO2 96 %  Placed patient on BIPAP per order and due to increase WOB, Tachypnea.

## 2012-07-07 NOTE — Progress Notes (Signed)
[facility] PROGRESS NOTE Interim History: 52 y.o. male. With ESRD on PD, admitted with respiratory failure that occurred during dialysis. He had been given Narcan with little improvement and ultimately needed to be intubated by critical care medicine on April 13. Blood cultures were drawn and a culture was also taken from his peritoneal fluid. He was started on vancomycin and Zosyn. Since then his peritoneal fluid is growing a yeast. We have been consult to assist in management this patient who is now been diagnosed with likely candidal infection of his peritoneal dialysis catheter and candidal peritonitis. Fungal infection of peritoneal dialysis catheters is an indication for removal of peritoneal dialysis catheter and Central, surgery has been consulted    Assessment/Plan: Acute respiratory failure with hypercapnia: - most likely due to pulmonary edema and possible PNA. Off bipap. - > 1.0 L on HD. - titrate O2. - previously on Vanc d/c on 4.18.2014. - currently on Zosyn - check CBG's ACHS, HbgA1c  Severe sepsis/Fungal peritonitis/Leukocytosis: - zosyn and Micafungin, leukocytosis improved. - ID consulted. - Peritoneal fluid: yeast, started on Micafungin 4.18.2014. Awaiting speciation. - Peritoneal catheter needs to be removed schedule on 4.20.2014    Pulmonary HTN- 50 mmHg 2011 ECHO/  Obesity hypoventilation syndrome - Recheck HIV.   Systolic congestive heart failure with reduced left ventricular function, NYHA class 1- EF 40-45% 2011 ECHO - stable.   CKD (chronic kidney disease) requiring chronic PERITONEAL dialysis - Per renal - HD MWF - monitor Hbg.  Anemia of chronic disease: - cont to monitor Hbg. Management per renal. - I will go ahead and transfused.   HTN (hypertension): - Blood pressure stable.   Code Status: full  Family Communication: no family at bedside.  Disposition Plan: SDU    Consultants:  ID  Nephrology  PCCM  Procedures: 4/13  peritoneal fluid Cx >> ngtd 4/17 >>  4/13 BCx>> ngtd >>    Antibiotics: 4/13 rocephin >>>4/13  4/13 azithromax >>>4/13  4/13 IV Vanc >>> 4.17.2014 4/13 IV Zosyn >>> Micafungin 4.18.2014>>  HPI/Subjective: No complains  Objective: Filed Vitals:   07/07/12 0100 07/07/12 0200 07/07/12 0400 07/07/12 0700  BP:  128/73 114/55   Pulse: 94 91 98 97  Temp:   98 F (36.7 C)   TempSrc:   Oral   Resp: 31 27 24 31   Height:      Weight:      SpO2: 97% 97% 99% 98%    Intake/Output Summary (Last 24 hours) at 07/07/12 0912 Last data filed at 07/07/12 0202  Gross per 24 hour  Intake    900 ml  Output    650 ml  Net    250 ml   Filed Weights   07/05/12 1407 07/06/12 0500 07/06/12 1634  Weight: 143.9 kg (317 lb 3.9 oz) 142 kg (313 lb 0.9 oz) 143.5 kg (316 lb 5.8 oz)    Exam:  General: Alert, awake, oriented x3, in no acute distress.  HEENT: No bruits, no goiter.  Heart: Regular rate and rhythm, without murmurs, rubs, gallops.  Lungs: Good air movement, clear to auscultation Abdomen: Soft, nontender, nondistended, positive bowel sounds.  Neuro: Grossly intact, nonfocal.   Data Reviewed: Basic Metabolic Panel:  Recent Labs Lab 07/02/12 0200 07/03/12 0358 07/04/12 0445 07/04/12 2300 07/05/12 0420 07/06/12 0440  NA 132* 136 139 137 137 138  K 3.4* 3.0* 4.4 4.1 4.5 3.9  CL 87* 96 101 99 99 100  CO2 23 26 27 25 25 29   GLUCOSE 111* 103*  101* 110* 121* 95  BUN 65* 47* 29* 34* 38* 28*  CREATININE 17.82* 12.57* 8.36* 10.20* 11.03* 7.93*  CALCIUM 8.6 8.6 8.5 8.5 8.7 8.5  MG 1.9  --  2.5  --  2.3  --   PHOS  --  5.4* 5.7*  --  5.5*  --    Liver Function Tests:  Recent Labs Lab 07/01/12 1627 07/03/12 0358 07/04/12 0445  AST 30  --   --   ALT 33  --   --   ALKPHOS 58  --   --   BILITOT 0.3  --   --   PROT 7.6  --   --   ALBUMIN 2.6* 2.2* 2.3*    Recent Labs Lab 07/01/12 1627  LIPASE 41   No results found for this basename: AMMONIA,  in the last 168  hours CBC:  Recent Labs Lab 07/01/12 1627  07/03/12 0358 07/04/12 0445 07/04/12 2300 07/05/12 0420 07/06/12 0440  WBC 8.3  < > 7.5 9.2 12.0* 17.0* 11.7*  NEUTROABS 6.3  --   --   --   --   --   --   HGB 9.9*  < > 8.6* 8.5* 8.5* 8.0* 7.2*  HCT 30.3*  < > 26.3* 28.0* 27.4* 25.2* 23.2*  MCV 93.2  < > 91.6 96.6 96.1 95.8 96.3  PLT 178  < > 153 171 218 172 150  < > = values in this interval not displayed. Cardiac Enzymes:  Recent Labs Lab 07/01/12 2123 07/04/12 2300  TROPONINI <0.30 <0.30   BNP (last 3 results)  Recent Labs  09/07/11 2000 06/19/12 1927  PROBNP 24858.0* 3703.0*   CBG:  Recent Labs Lab 07/02/12 0722 07/02/12 1146 07/04/12 2305  GLUCAP 112* 89 98    Recent Results (from the past 240 hour(s))  MRSA PCR SCREENING     Status: None   Collection Time    07/02/12 12:17 AM      Result Value Range Status   MRSA by PCR NEGATIVE  NEGATIVE Final   Comment:            The GeneXpert MRSA Assay (FDA     approved for NASAL specimens     only), is one component of a     comprehensive MRSA colonization     surveillance program. It is not     intended to diagnose MRSA     infection nor to guide or     monitor treatment for     MRSA infections.  CULTURE, BLOOD (ROUTINE X 2)     Status: None   Collection Time    07/02/12  3:30 AM      Result Value Range Status   Specimen Description BLOOD LEFT HAND   Final   Special Requests BOTTLES DRAWN AEROBIC ONLY 6CC   Final   Culture  Setup Time 07/02/2012 07:34   Final   Culture     Final   Value:        BLOOD CULTURE RECEIVED NO GROWTH TO DATE CULTURE WILL BE HELD FOR 5 DAYS BEFORE ISSUING A FINAL NEGATIVE REPORT   Report Status PENDING   Incomplete  CULTURE, BLOOD (ROUTINE X 2)     Status: None   Collection Time    07/02/12  3:45 AM      Result Value Range Status   Specimen Description BLOOD LEFT HAND   Final   Special Requests BOTTLES DRAWN AEROBIC ONLY 3CC   Final  Culture  Setup Time 07/02/2012 07:34    Final   Culture     Final   Value:        BLOOD CULTURE RECEIVED NO GROWTH TO DATE CULTURE WILL BE HELD FOR 5 DAYS BEFORE ISSUING A FINAL NEGATIVE REPORT   Report Status PENDING   Incomplete  BODY FLUID CULTURE     Status: None   Collection Time    07/02/12  5:57 AM      Result Value Range Status   Specimen Description FLUID PERITONEAL   Final   Special Requests Normal   Final   Gram Stain     Final   Value: ABUNDANT WBC PRESENT,BOTH PMN AND MONONUCLEAR     NO ORGANISMS SEEN   Culture     Final   Value: RARE YEAST     Note: CRITICAL RESULT CALLED TO, READ BACK BY AND VERIFIED WITH: NURSE AMARUGE@1431  ON [id number] BY [facility]   Report Status PENDING   Incomplete     Studies: Dg Abd 1 View  07/05/2012  *RADIOLOGY REPORT*  Clinical Data: Peritoneal dialysis catheter not draining.  Evaluate for position of catheter.  Question constipation.  ABDOMEN - 1 VIEW  Comparison: CT of 07/01/2012  Findings: Multiple supine views, performed portably.  These are moderate to markedly degraded by patient body habitus.  Gas within normal caliber stomach.  Increased density within the right side abdomen is likely due to contrast within the ascending colon from prior CT.  A large bore catheter projects over the midabdomen.  This could represent the external course of the dialysis catheter.  The remainder of the catheter is not confidently identified.  There is a moderate amount of colonic stool.  The low pelvis is excluded.  Images are motion degraded.  IMPRESSION: Moderately degraded exam, secondary multiple factors detailed above.  Lack of confident visualization of the dialysis catheter.  Its external course may be identified, terminating over the left paracentral abdomen.  Moderate amount of ascending colonic stool.  Low pelvis excluded.   Original Report Authenticated By: Jeronimo Greaves, M.D.    Dg Chest Port 1 View  07/06/2012  *RADIOLOGY REPORT*  Clinical Data: Respiratory failure, follow  PORTABLE CHEST - 1 VIEW   Comparison: A portable chest x-ray of 07/05/2012  Findings: There is little change in poor aeration with mild pulmonary vascular congestion remaining.  Cardiomegaly is stable. Left and right central venous lines are unchanged in position.  No pneumothorax is noted.  IMPRESSION: Little change in poor aeration with cardiomegaly and pulmonary vascular congestion.   Original Report Authenticated By: Dwyane Dee, M.D.    Dg Chest Port 1 View  07/05/2012  *RADIOLOGY REPORT*  Clinical Data: Increased respiratory rate.  Question volume overload.  PORTABLE CHEST - 1 VIEW  Comparison: Plain film chest 07/04/2012.  Findings: Bilateral internal jugular central venous catheters are unchanged.  Lung volumes are low with basilar atelectasis, also unchanged.  No pulmonary edema is identified.  There is cardiomegaly.  IMPRESSION:  No acute abnormality with bibasilar atelectasis and cardiomegaly again seen.   Original Report Authenticated By: Holley Dexter, M.D.     Scheduled Meds: . alteplase  2 mg Intracatheter Once  . antiseptic oral rinse  15 mL Mouth Rinse QID  . aspirin  325 mg Per Tube Daily  . calcitRIOL  0.25 mcg Oral Custom  . darbepoetin (ARANESP) injection - DIALYSIS  100 mcg Intravenous Q Fri-HD  . dialysis solution for CAPD/CCPD [facility])   Peritoneal Dialysis Once  in dialysis  . dialysis solution 2.5% low-MG/low-CA   Intraperitoneal Once in dialysis  . lanthanum  1,000 mg Oral TID WC  . micafungin (MYCAMINE) IV  100 mg Intravenous Daily  . pantoprazole  40 mg Oral Q1200  . piperacillin-tazobactam (ZOSYN)  IV  2.25 g Intravenous Q8H  . sodium chloride  3 mL Intravenous Q12H   Continuous Infusions:    [name]  [facility] Pager [phone]. If 8PM-8AM, please contact night-coverage at [email or web address], password [email or web address] 07/07/2012, 9:12 AM  LOS: 6 days

## 2012-07-07 NOTE — Progress Notes (Signed)
Subjective: No c/o this am. Had some resp issues overnight requiring biPAP  Objective: Vital signs in last 24 hours: Temp:  [96.7 F (35.9 C)-98.9 F (37.2 C)] 98 F (36.7 C) (04/19 0400) Pulse Rate:  [86-106] 97 (04/19 0700) Resp:  [21-34] 31 (04/19 0700) BP: (79-160)/(46-78) 114/55 mmHg (04/19 0400) SpO2:  [88 %-100 %] 98 % (04/19 0700) FiO2 (%):  [50 %] 50 % (04/18 2145) Weight:  [316 lb 5.8 oz (143.5 kg)] 316 lb 5.8 oz (143.5 kg) (04/18 1634) Last BM Date: 07/07/12  Intake/Output from previous day: 04/18 0701 - 04/19 0700 In: 1150 [P.O.:600; Blood:350; IV Piggyback:200] Out: 650  Intake/Output this shift:    Alert, pleasant Obese, soft, nt, nd; well healed incisions  Lab Results:   Recent Labs  07/05/12 0420 07/06/12 0440  WBC 17.0* 11.7*  HGB 8.0* 7.2*  HCT 25.2* 23.2*  PLT 172 150   BMET  Recent Labs  07/05/12 0420 07/06/12 0440  NA 137 138  K 4.5 3.9  CL 99 100  CO2 25 29  GLUCOSE 121* 95  BUN 38* 28*  CREATININE 11.03* 7.93*  CALCIUM 8.7 8.5   PT/INR No results found for this basename: LABPROT, INR,  in the last 72 hours ABG  Recent Labs  07/05/12 0006 07/05/12 0542  PHART 7.249* 7.238*  HCO3 26.3* 25.3*    Studies/Results: Dg Abd 1 View  07/05/2012  *RADIOLOGY REPORT*  Clinical Data: Peritoneal dialysis catheter not draining.  Evaluate for position of catheter.  Question constipation.  ABDOMEN - 1 VIEW  Comparison: CT of 07/01/2012  Findings: Multiple supine views, performed portably.  These are moderate to markedly degraded by patient body habitus.  Gas within normal caliber stomach.  Increased density within the right side abdomen is likely due to contrast within the ascending colon from prior CT.  A large bore catheter projects over the midabdomen.  This could represent the external course of the dialysis catheter.  The remainder of the catheter is not confidently identified.  There is a moderate amount of colonic stool.  The low  pelvis is excluded.  Images are motion degraded.  IMPRESSION: Moderately degraded exam, secondary multiple factors detailed above.  Lack of confident visualization of the dialysis catheter.  Its external course may be identified, terminating over the left paracentral abdomen.  Moderate amount of ascending colonic stool.  Low pelvis excluded.   Original Report Authenticated By: Jeronimo Greaves, M.D.    Dg Chest Port 1 View  07/06/2012  *RADIOLOGY REPORT*  Clinical Data: Respiratory failure, follow  PORTABLE CHEST - 1 VIEW  Comparison: A portable chest x-ray of 07/05/2012  Findings: There is little change in poor aeration with mild pulmonary vascular congestion remaining.  Cardiomegaly is stable. Left and right central venous lines are unchanged in position.  No pneumothorax is noted.  IMPRESSION: Little change in poor aeration with cardiomegaly and pulmonary vascular congestion.   Original Report Authenticated By: Dwyane Dee, M.D.    Dg Chest Port 1 View  07/05/2012  *RADIOLOGY REPORT*  Clinical Data: Increased respiratory rate.  Question volume overload.  PORTABLE CHEST - 1 VIEW  Comparison: Plain film chest 07/04/2012.  Findings: Bilateral internal jugular central venous catheters are unchanged.  Lung volumes are low with basilar atelectasis, also unchanged.  No pulmonary edema is identified.  There is cardiomegaly.  IMPRESSION:  No acute abnormality with bibasilar atelectasis and cardiomegaly again seen.   Original Report Authenticated By: Holley Dexter, M.D.     Anti-infectives: Anti-infectives  Start     Dose/Rate Route Frequency Ordered Stop   07/06/12 1545  micafungin (MYCAMINE) 100 mg in sodium chloride 0.9 % 100 mL IVPB     100 mg 100 mL/hr over 1 Hours Intravenous Daily 07/06/12 1541     07/05/12 1800  vancomycin (VANCOCIN) IVPB 750 mg/150 ml premix  Status:  Discontinued     750 mg 150 mL/hr over 60 Minutes Intravenous  Once 07/05/12 1352 07/05/12 1355   07/05/12 1600  vancomycin  (VANCOCIN) IVPB 750 mg/150 ml premix     750 mg 150 mL/hr over 60 Minutes Intravenous  Once 07/05/12 1355 07/05/12 1956   07/04/12 2100  piperacillin-tazobactam (ZOSYN) IVPB 2.25 g     2.25 g 100 mL/hr over 30 Minutes Intravenous 3 times per day 07/04/12 1120     07/02/12 2200  piperacillin-tazobactam (ZOSYN) IVPB 3.375 g  Status:  Discontinued     3.375 g 12.5 mL/hr over 240 Minutes Intravenous Every 6 hours 07/02/12 1703 07/04/12 1120   07/02/12 2000  vancomycin (VANCOCIN) 1,500 mg in sodium chloride 0.9 % 500 mL IVPB  Status:  Discontinued     1,500 mg 250 mL/hr over 120 Minutes Intravenous Every 24 hours 07/02/12 1708 07/03/12 1245   07/02/12 0800  piperacillin-tazobactam (ZOSYN) IVPB 2.25 g  Status:  Discontinued     2.25 g 100 mL/hr over 30 Minutes Intravenous Every 8 hours 07/02/12 0054 07/02/12 1703   07/02/12 0100  vancomycin (VANCOCIN) 2,500 mg in sodium chloride 0.9 % 500 mL IVPB     2,500 mg 250 mL/hr over 120 Minutes Intravenous Every 12 hours 07/02/12 0054 07/02/12 0409   07/02/12 0100  piperacillin-tazobactam (ZOSYN) IVPB 2.25 g     2.25 g 100 mL/hr over 30 Minutes Intravenous  Once 07/02/12 0054 07/02/12 0519   07/01/12 2245  cefTRIAXone (ROCEPHIN) 1 g in dextrose 5 % 50 mL IVPB     1 g 100 mL/hr over 30 Minutes Intravenous  Once 07/01/12 2236 07/01/12 2336   07/01/12 2245  azithromycin (ZITHROMAX) 500 mg in dextrose 5 % 250 mL IVPB  Status:  Discontinued     500 mg 250 mL/hr over 60 Minutes Intravenous  Once 07/01/12 2236 07/02/12 0218      Assessment/Plan: ESRD s/p PD catheter placement at [facility] Fungal peritonitis per Renal team Renal has requested PD cath removal given few yeast seen on body fluid culture  Pt ate breakfast today So will plan on PD cath removal on Sunday NPO p MN  Mary Sella. Andrey Campanile, MD, FACS General, Bariatric, & Minimally Invasive Surgery [facility], [place]   LOS: 6 days    Atilano Ina 07/07/2012

## 2012-07-08 ENCOUNTER — Encounter (HOSPITAL_COMMUNITY): Payer: Self-pay | Admitting: Certified Registered"

## 2012-07-08 ENCOUNTER — Inpatient Hospital Stay (HOSPITAL_COMMUNITY): Payer: Medicare Other | Admitting: Certified Registered"

## 2012-07-08 ENCOUNTER — Inpatient Hospital Stay (HOSPITAL_COMMUNITY): Payer: Medicare Other

## 2012-07-08 ENCOUNTER — Encounter (HOSPITAL_COMMUNITY)
Admission: EM | Disposition: A | Discharge status: Home / Self Care | Payer: Self-pay | Source: Home / Self Care | Attending: Internal Medicine

## 2012-07-08 DIAGNOSIS — N186 End stage renal disease: Secondary | ICD-10-CM

## 2012-07-08 DIAGNOSIS — D649 Anemia, unspecified: Secondary | ICD-10-CM

## 2012-07-08 DIAGNOSIS — T8571XA Infection and inflammatory reaction due to peritoneal dialysis catheter, initial encounter: Secondary | ICD-10-CM

## 2012-07-08 LAB — BASIC METABOLIC PANEL
BUN: 38 mg/dL — ABNORMAL HIGH (ref 6–23)
Calcium: 8.9 mg/dL (ref 8.4–10.5)
GFR calc non Af Amer: 5 mL/min — ABNORMAL LOW (ref >90)
Glucose, Bld: 92 mg/dL (ref 70–99)

## 2012-07-08 LAB — RENAL FUNCTION PANEL
Albumin: 2.3 g/dL — ABNORMAL LOW (ref 3.5–5.2)
Calcium: 9.1 mg/dL (ref 8.4–10.5)
GFR calc Af Amer: 6 mL/min — ABNORMAL LOW (ref >90)
GFR calc non Af Amer: 5 mL/min — ABNORMAL LOW (ref >90)
Phosphorus: 6.9 mg/dL — ABNORMAL HIGH (ref 2.3–4.6)
Potassium: 4.2 mEq/L (ref 3.5–5.1)
Sodium: 140 mEq/L (ref 135–145)

## 2012-07-08 LAB — TYPE AND SCREEN: Antibody Screen: NEGATIVE

## 2012-07-08 LAB — POCT I-STAT 3, ART BLOOD GAS (G3+)
Acid-base deficit: 1 mmol/L (ref 0.0–2.0)
Acid-base deficit: 1 mmol/L (ref 0.0–2.0)
O2 Saturation: 88 %
pCO2 arterial: 69.3 mmHg (ref 35.0–45.0)
pO2, Arterial: 108 mmHg — ABNORMAL HIGH (ref 80.0–100.0)
pO2, Arterial: 63 mmHg — ABNORMAL LOW (ref 80.0–100.0)

## 2012-07-08 LAB — CULTURE, BLOOD (ROUTINE X 2)
Culture: NO GROWTH
Culture: NO GROWTH

## 2012-07-08 LAB — HEMOGLOBIN A1C: Hgb A1c MFr Bld: 5.1 % (ref <5.7)

## 2012-07-08 LAB — CBC
MCV: 92.3 fL (ref 78.0–100.0)
Platelets: 163 10*3/uL (ref 150–400)
RBC: 2.97 MIL/uL — ABNORMAL LOW (ref 4.22–5.81)
WBC: 11.3 10*3/uL — ABNORMAL HIGH (ref 4.0–10.5)

## 2012-07-08 LAB — GLUCOSE, CAPILLARY
Glucose-Capillary: 92 mg/dL (ref 70–99)
Glucose-Capillary: 78 mg/dL (ref 70–99)
Glucose-Capillary: 76 mg/dL (ref 70–99)

## 2012-07-08 SURGERY — MINOR REMOVAL OF PERITONEAL DIALYSIS CATHETER
Anesthesia: General | Site: Abdomen | Wound class: Dirty or Infected

## 2012-07-08 MED ORDER — FENTANYL CITRATE 0.05 MG/ML IJ SOLN
INTRAMUSCULAR | Status: DC | PRN
  Administered 2012-07-08: 150 ug via INTRAVENOUS

## 2012-07-08 MED ORDER — ALBUMIN HUMAN 5 % IV SOLN
INTRAVENOUS | Status: DC | PRN
  Administered 2012-07-08: 10:00:00 via INTRAVENOUS

## 2012-07-08 MED ORDER — ALBUMIN HUMAN 25 % IV SOLN
25.0000 g | Freq: Once | INTRAVENOUS | Status: AC
  Administered 2012-07-08: 25 g via INTRAVENOUS

## 2012-07-08 MED ORDER — FENTANYL BOLUS VIA INFUSION
25.0000 ug | Freq: Four times a day (QID) | INTRAVENOUS | Status: DC | PRN
  Filled 2012-07-08: qty 100

## 2012-07-08 MED ORDER — PROMETHAZINE HCL 25 MG/ML IJ SOLN: 6.2500 mg | INTRAMUSCULAR | Status: DC | PRN

## 2012-07-08 MED ORDER — OXYCODONE HCL 5 MG PO TABS: 5.0000 mg | ORAL_TABLET | Freq: Once | ORAL | Status: DC | PRN

## 2012-07-08 MED ORDER — PANTOPRAZOLE SODIUM 40 MG PO PACK
40.0000 mg | PACK | Freq: Every day | ORAL | Status: DC
  Administered 2012-07-08: 40 mg
  Filled 2012-07-08 (×2): qty 20

## 2012-07-08 MED ORDER — SODIUM CHLORIDE 0.9 % IV SOLN: 100.0000 mL | INTRAVENOUS | Status: DC | PRN

## 2012-07-08 MED ORDER — MIDAZOLAM HCL 2 MG/2ML IJ SOLN
1.0000 mg | INTRAMUSCULAR | Status: DC | PRN
  Administered 2012-07-08 (×5): 2 mg via INTRAVENOUS
  Filled 2012-07-08: qty 2
  Filled 2012-07-08: qty 4
  Filled 2012-07-08 (×3): qty 2

## 2012-07-08 MED ORDER — PANTOPRAZOLE SODIUM 40 MG PO PACK
40.0000 mg | PACK | Freq: Every day | ORAL | Status: DC
  Filled 2012-07-08: qty 20

## 2012-07-08 MED ORDER — NEPRO/CARBSTEADY PO LIQD
237.0000 mL | ORAL | Status: DC | PRN
  Filled 2012-07-08: qty 237

## 2012-07-08 MED ORDER — OXYCODONE HCL 5 MG/5ML PO SOLN: 5.0000 mg | Freq: Once | ORAL | Status: DC | PRN

## 2012-07-08 MED ORDER — PHENYLEPHRINE HCL 10 MG/ML IJ SOLN
INTRAMUSCULAR | Status: DC | PRN
  Administered 2012-07-08: 80 ug via INTRAVENOUS

## 2012-07-08 MED ORDER — HYDROCODONE-ACETAMINOPHEN 5-325 MG PO TABS: 1.0000 | ORAL_TABLET | ORAL | Status: DC | PRN

## 2012-07-08 MED ORDER — LIDOCAINE HCL (PF) 1 % IJ SOLN: 5.0000 mL | INTRAMUSCULAR | Status: DC | PRN

## 2012-07-08 MED ORDER — ACETAMINOPHEN 160 MG/5ML PO SOLN
500.0000 mg | Freq: Three times a day (TID) | ORAL | Status: DC | PRN
  Filled 2012-07-08: qty 15.6
  Filled 2012-07-08: qty 20.3

## 2012-07-08 MED ORDER — 0.9 % SODIUM CHLORIDE (POUR BTL) OPTIME
TOPICAL | Status: DC | PRN
  Administered 2012-07-08: 1000 mL

## 2012-07-08 MED ORDER — HEPARIN SODIUM (PORCINE) 1000 UNIT/ML DIALYSIS: 1000.0000 [IU] | INTRAMUSCULAR | Status: DC | PRN

## 2012-07-08 MED ORDER — PENTAFLUOROPROP-TETRAFLUOROETH EX AERO
1.0000 | INHALATION_SPRAY | CUTANEOUS | Status: DC | PRN
Start: 2012-07-08 — End: 2012-07-10

## 2012-07-08 MED ORDER — SODIUM CHLORIDE 0.9 % IV SOLN
25.0000 ug/h | INTRAVENOUS | Status: DC
  Administered 2012-07-08: 100 ug/h via INTRAVENOUS
  Administered 2012-07-09: 200 ug/h via INTRAVENOUS
  Filled 2012-07-08 (×2): qty 50

## 2012-07-08 MED ORDER — HYDROMORPHONE HCL PF 1 MG/ML IJ SOLN: 0.2500 mg | INTRAMUSCULAR | Status: DC | PRN

## 2012-07-08 MED ORDER — MIDAZOLAM HCL 5 MG/5ML IJ SOLN
INTRAMUSCULAR | Status: DC | PRN
  Administered 2012-07-08: 2 mg via INTRAVENOUS

## 2012-07-08 MED ORDER — LIDOCAINE-PRILOCAINE 2.5-2.5 % EX CREA
1.0000 "application " | TOPICAL_CREAM | CUTANEOUS | Status: DC | PRN
  Filled 2012-07-08: qty 5

## 2012-07-08 MED ORDER — LACTATED RINGERS IV SOLN
INTRAVENOUS | Status: DC | PRN
  Administered 2012-07-08 (×2): via INTRAVENOUS

## 2012-07-08 MED ORDER — ALTEPLASE 2 MG IJ SOLR: 2.0000 mg | Freq: Once | INTRAMUSCULAR | Status: AC | PRN

## 2012-07-08 MED ORDER — MORPHINE SULFATE 2 MG/ML IJ SOLN: 1.0000 mg | INTRAMUSCULAR | Status: DC | PRN

## 2012-07-08 MED ORDER — ROCURONIUM BROMIDE 100 MG/10ML IV SOLN
INTRAVENOUS | Status: DC | PRN
  Administered 2012-07-08 (×2): 20 mg via INTRAVENOUS
  Administered 2012-07-08: 50 mg via INTRAVENOUS

## 2012-07-08 MED ORDER — PROPOFOL 10 MG/ML IV BOLUS
INTRAVENOUS | Status: DC | PRN
  Administered 2012-07-08: 80 mg via INTRAVENOUS

## 2012-07-08 MED ORDER — LIDOCAINE HCL (CARDIAC) 20 MG/ML IV SOLN
INTRAVENOUS | Status: DC | PRN
  Administered 2012-07-08: 80 mg via INTRAVENOUS

## 2012-07-08 MED ORDER — SUCCINYLCHOLINE CHLORIDE 20 MG/ML IJ SOLN
INTRAMUSCULAR | Status: DC | PRN
  Administered 2012-07-08: 100 mg via INTRAVENOUS

## 2012-07-08 SURGICAL SUPPLY — 19 items
CONT SPEC STER OR (MISCELLANEOUS) ×2 IMPLANT
DRSG MEPITEL 3X4 ME34 (GAUZE/BANDAGES/DRESSINGS) ×2 IMPLANT
GAUZE PACKING IODOFORM 1/2 (PACKING) ×2 IMPLANT
GLOVE BIO SURGEON STRL SZ 6 (GLOVE) ×2 IMPLANT
GLOVE BIO SURGEON STRL SZ7 (GLOVE) ×2 IMPLANT
GLOVE BIOGEL PI IND STRL 6.5 (GLOVE) ×1 IMPLANT
GLOVE BIOGEL PI INDICATOR 6.5 (GLOVE) ×1
GOWN PREVENTION PLUS XXLARGE (GOWN DISPOSABLE) ×2 IMPLANT
GOWN STRL REIN XL XLG (GOWN DISPOSABLE) ×2 IMPLANT
KIT BASIN OR (CUSTOM PROCEDURE TRAY) ×2 IMPLANT
NS IRRIG 1000ML POUR BTL (IV SOLUTION) ×2 IMPLANT
PACK GENERAL/GYN (CUSTOM PROCEDURE TRAY) ×2 IMPLANT
STAPLER VISISTAT 35W (STAPLE) ×2 IMPLANT
SUT MNCRL AB 4-0 PS2 18 (SUTURE) ×2 IMPLANT
SUT VIC AB 3-0 SH 27 (SUTURE) ×1
SUT VIC AB 3-0 SH 27X BRD (SUTURE) ×1 IMPLANT
SUT VICRYL 0 UR6 27IN ABS (SUTURE) ×4 IMPLANT
TOWEL OR 17X24 6PK STRL BLUE (TOWEL DISPOSABLE) ×2 IMPLANT
TOWEL OR 17X26 10 PK STRL BLUE (TOWEL DISPOSABLE) ×2 IMPLANT

## 2012-07-08 NOTE — Progress Notes (Signed)
eLink Physician-Brief Progress Note Patient Name: Gary Rivera DOB: 07-Jun-1960 MRN: [id number]  Date of Service  07/08/2012   HPI/Events of Note    Recent Labs Lab 07/04/12 2306 07/05/12 0006 07/05/12 0542 07/08/12 1228 07/08/12 1613  PHART 7.248* 7.249* 7.238* 7.208* 7.303*  PCO2ART 58.0* 60.0* 59.4* 69.3* 51.3*  PO2ART 56.0* 72.0* 122.0* 108.0* 63.0*  HCO3 25.3* 26.3* 25.3* 27.6* 25.2*  TCO2 27 28 27 30 27   O2SAT 83.0 91.0 98.0 97.0 88.0     eICU Interventions  ABG ok on current vent settings. Will continue to monitor   Intervention Category Intermediate Interventions: Diagnostic test evaluation  Melitza Metheny 07/08/2012, 5:13 PM

## 2012-07-08 NOTE — Progress Notes (Signed)
Patient ID: Gary Rivera, male   DOB: Aug 10, 1960, 52 y.o.   MRN: [id number] Day of Surgery  Subjective: Pt with min complaints this AM.  Objective: Vital signs in last 24 hours: Temp:  [98 F (36.7 C)-99.7 F (37.6 C)] 98.4 F (36.9 C) (04/20 0730) Pulse Rate:  [82-100] 93 (04/20 0730) Resp:  [19-35] 29 (04/20 0730) BP: (101-147)/(59-89) 129/70 mmHg (04/20 0730) SpO2:  [87 %-100 %] 98 % (04/20 0730) FiO2 (%):  [50 %-90 %] 90 % (04/20 0730) Weight:  [319 lb 3.6 oz (144.8 kg)] 319 lb 3.6 oz (144.8 kg) (04/20 0446) Last BM Date: 07/07/12  Intake/Output from previous day: 04/19 0701 - 04/20 0700 In: 1393 [P.O.:840; I.V.:3; Blood:350; IV Piggyback:200] Out: -  Intake/Output this shift:    Alert, pleasant Obese, soft, nt, nd; well healed incisions  Lab Results:   Recent Labs  07/06/12 0440  WBC 11.7*  HGB 7.2*  HCT 23.2*  PLT 150   BMET  Recent Labs  07/06/12 0440  NA 138  K 3.9  CL 100  CO2 29  GLUCOSE 95  BUN 28*  CREATININE 7.93*  CALCIUM 8.5   PT/INR No results found for this basename: LABPROT, INR,  in the last 72 hours ABG No results found for this basename: PHART, PCO2, PO2, HCO3,  in the last 72 hours  Studies/Results: No results found.  Anti-infectives: Anti-infectives   Start     Dose/Rate Route Frequency Ordered Stop   07/07/12 2200  vancomycin (VANCOCIN) 1,250 mg in sodium chloride 0.9 % 250 mL IVPB  Status:  Discontinued     1,250 mg 250 mL/hr over 60 Minutes Intravenous  Once 07/07/12 2158 07/07/12 2206   07/06/12 1545  micafungin (MYCAMINE) 100 mg in sodium chloride 0.9 % 100 mL IVPB     100 mg 100 mL/hr over 1 Hours Intravenous Daily 07/06/12 1541     07/05/12 1800  vancomycin (VANCOCIN) IVPB 750 mg/150 ml premix  Status:  Discontinued     750 mg 150 mL/hr over 60 Minutes Intravenous  Once 07/05/12 1352 07/05/12 1355   07/05/12 1600  vancomycin (VANCOCIN) IVPB 750 mg/150 ml premix     750 mg 150 mL/hr over 60 Minutes Intravenous   Once 07/05/12 1355 07/05/12 1956   07/04/12 2100  piperacillin-tazobactam (ZOSYN) IVPB 2.25 g     2.25 g 100 mL/hr over 30 Minutes Intravenous 3 times per day 07/04/12 1120     07/02/12 2200  piperacillin-tazobactam (ZOSYN) IVPB 3.375 g  Status:  Discontinued     3.375 g 12.5 mL/hr over 240 Minutes Intravenous Every 6 hours 07/02/12 1703 07/04/12 1120   07/02/12 2000  vancomycin (VANCOCIN) 1,500 mg in sodium chloride 0.9 % 500 mL IVPB  Status:  Discontinued     1,500 mg 250 mL/hr over 120 Minutes Intravenous Every 24 hours 07/02/12 1708 07/03/12 1245   07/02/12 0800  piperacillin-tazobactam (ZOSYN) IVPB 2.25 g  Status:  Discontinued     2.25 g 100 mL/hr over 30 Minutes Intravenous Every 8 hours 07/02/12 0054 07/02/12 1703   07/02/12 0100  vancomycin (VANCOCIN) 2,500 mg in sodium chloride 0.9 % 500 mL IVPB     2,500 mg 250 mL/hr over 120 Minutes Intravenous Every 12 hours 07/02/12 0054 07/02/12 0409   07/02/12 0100  piperacillin-tazobactam (ZOSYN) IVPB 2.25 g     2.25 g 100 mL/hr over 30 Minutes Intravenous  Once 07/02/12 0054 07/02/12 0519   07/01/12 2245  cefTRIAXone (ROCEPHIN) 1 g in  dextrose 5 % 50 mL IVPB     1 g 100 mL/hr over 30 Minutes Intravenous  Once 07/01/12 2236 07/01/12 2336   07/01/12 2245  azithromycin (ZITHROMAX) 500 mg in dextrose 5 % 250 mL IVPB  Status:  Discontinued     500 mg 250 mL/hr over 60 Minutes Intravenous  Once 07/01/12 2236 07/02/12 0218      Assessment/Plan: ESRD s/p PD catheter placement at [facility] Renal team desires removal because of fungal infection/colonization of PD catheter.   He does not have frank peritonitis, but yeast unlikely to clear from catheter with antibiotic treatment, and they have significant concerns that this would worsen if left in place.  Will plan removal of PD catheter.   Pt NPO.   Pt has reasonable platelet count.      LOS: 7 days    [facility] 07/08/2012

## 2012-07-08 NOTE — Progress Notes (Signed)
Hemodialysis- total UF=4L without issue. BP remained stable throughout treatment. Albumin 25g x1  given for support with bp drop to 90s. Vitals stable at rinseback. Update given to [facility].

## 2012-07-08 NOTE — Op Note (Signed)
PRE-OPERATIVE DIAGNOSIS: ESRD, infected PD catheter with yeast  POST-OPERATIVE DIAGNOSIS: Same   PROCEDURE: Procedure(s):  Removal of PD catheter   SURGEON: Surgeon(s):  Almond Lint, MD   ANESTHESIA: general  DRAINS: none   SPECIMEN: PD catheter and fluid to microbiology  COUNTS: correct   PATIENT DISPOSITION: ICU intubated.    FINDINGS: chunky exudate in    PROCEDURE:  Patient was identified in the holding area and taken to the operating room where he was placed supine on the operating room table. General anesthesia was induced. His abdomen was prepped and draped in sterile fashion. Timeout was performed according to the surgical safety checklist. When all was correct we continued.   The catheter extension set was removed and the catheter capped off. The catheter was prepped into the field.  The catheter was attempted to be dissected out from the skin insertion site.  The subxiphoid incision was opened and the cuff dissected out.  The catheter was clamped and divided at this incision.  The catheter had a cuff between the exit site and the left subxiphoid incision.   Blunt dissection was used from the skin insertion site to try to free up the cuff in the skin. The external portion of catheter was able to be removed.  The left paramedian incision was opened.  There was an additional cuff between the umbilical incision and the subxiphoid incision.  This cuff was very adherent and quite difficult to dissect out.  Once this was freed up, the remaining catheter was pulled through to the umbilical incision.    The catheter was then caught at the anterior rectus fascia.  The bead of the catheter was in the rectus sheath, and the anterior fascia was opened just enough to be able to remove the catheter. The bead/cuff was also very adherent, and was dissected out with a combination of cautery, blunt, and sharp dissection.  The catheter was pulled completely out. The tip of the catheter had chunky  opaque drainage in it.  The internal portion of the catheter was sent to microbiology with some peritoneal fluid.  Two 0 Vicryl sutures were used to close the fascia. There was no residual palpable fascial defect.   Hemostasis was achieved with the cautery at all incisions. The exit site was packed with 1/2" Iodoform gauze.  The remaining incisions were closed with staples. The incisions were dressed with mepilex.   Needle, sponge, and instrument counts were correct x2. The patient was taken to the ICU intubated.

## 2012-07-08 NOTE — Progress Notes (Signed)
[facility] ROUNDING NOTE   Subjective:   Interval History: s/p PD cath removal today, on vent, CXR today shows new diffuse pulm infiltrates Objective:  Vital signs in last 24 hours:  Temp:  [98 F (36.7 C)-99.7 F (37.6 C)] 98.3 F (36.8 C) (04/20 1100) Pulse Rate:  [82-115] 97 (04/20 1330) Resp:  [17-35] 19 (04/20 1330) BP: (101-190)/(59-103) 124/67 mmHg (04/20 1330) SpO2:  [85 %-100 %] 90 % (04/20 1330) FiO2 (%):  [40 %-100 %] 60 % (04/20 1200) Weight:  [144.8 kg (319 lb 3.6 oz)] 144.8 kg (319 lb 3.6 oz) (04/20 0446)  Weight change: 1.3 kg (2 lb 13.9 oz) Filed Weights   07/06/12 0500 07/06/12 1634 07/08/12 0446  Weight: 142 kg (313 lb 0.9 oz) 143.5 kg (316 lb 5.8 oz) 144.8 kg (319 lb 3.6 oz)    Intake/Output: I/O last 3 completed shifts: In: 1443 [P.O.:840; I.V.:3; Blood:350; IV Piggyback:250] Out: 650 [Other:650]   Intake/Output this shift:  Total I/O In: 735.2 [I.V.:415.2; NG/GT:70; IV Piggyback:250] Out: 50 [Blood:50] Gen: on vent, sedated, BP 130/84 CVS- RRR systolic murmur R IJ temp cath 3-lumen RS- coarse BS bilat ABD- distended, PD cath is out EXT- trace LE edema   Basic Metabolic Panel:  Recent Labs Lab 07/02/12 0200 07/03/12 0358 07/04/12 0445 07/04/12 2300 07/05/12 0420 07/06/12 0440 07/08/12 1227 07/08/12 1228  NA 132* 136 139 137 137 138 140 139  K 3.4* 3.0* 4.4 4.1 4.5 3.9 4.2 4.2  CL 87* 96 101 99 99 100 99 99  CO2 23 26 27 25 25 29 26 26   GLUCOSE 111* 103* 101* 110* 121* 95 94 92  BUN 65* 47* 29* 34* 38* 28* 38* 38*  CREATININE 17.82* 12.57* 8.36* 10.20* 11.03* 7.93* 10.17* 9.87*  CALCIUM 8.6 8.6 8.5 8.5 8.7 8.5 9.1 8.9  MG 1.9  --  2.5  --  2.3  --   --   --   PHOS  --  5.4* 5.7*  --  5.5*  --  6.9*  --     Liver Function Tests:  Recent Labs Lab 07/01/12 1627 07/03/12 0358 07/04/12 0445 07/08/12 1227  AST 30  --   --   --   ALT 33  --   --   --   ALKPHOS 58  --   --   --   BILITOT 0.3  --   --   --   PROT  7.6  --   --   --   ALBUMIN 2.6* 2.2* 2.3* 2.3*    Recent Labs Lab 07/01/12 1627  LIPASE 41   No results found for this basename: AMMONIA,  in the last 168 hours  CBC:  Recent Labs Lab 07/01/12 1627  07/04/12 0445 07/04/12 2300 07/05/12 0420 07/06/12 0440 07/08/12 0758  WBC 8.3  < > 9.2 12.0* 17.0* 11.7* 11.3*  NEUTROABS 6.3  --   --   --   --   --   --   HGB 9.9*  < > 8.5* 8.5* 8.0* 7.2* 8.5*  HCT 30.3*  < > 28.0* 27.4* 25.2* 23.2* 27.4*  MCV 93.2  < > 96.6 96.1 95.8 96.3 92.3  PLT 178  < > 171 218 172 150 163  < > = values in this interval not displayed.  Cardiac Enzymes:  Recent Labs Lab 07/01/12 2123 07/04/12 2300 07/08/12 1228  TROPONINI <0.30 <0.30 <0.30    BNP: No components found with this basename: POCBNP,   CBG:  Recent Labs Lab 07/07/12 1228 07/07/12 1559 07/07/12 2129 07/08/12 0732 07/08/12 1131  GLUCAP 118* 107* 102* 92 78    Microbiology: Results for orders placed during the hospital encounter of 07/01/12  MRSA PCR SCREENING     Status: None   Collection Time    07/02/12 12:17 AM      Result Value Range Status   MRSA by PCR NEGATIVE  NEGATIVE Final   Comment:            The GeneXpert MRSA Assay (FDA     approved for NASAL specimens     only), is one component of a     comprehensive MRSA colonization     surveillance program. It is not     intended to diagnose MRSA     infection nor to guide or     monitor treatment for     MRSA infections.  CULTURE, BLOOD (ROUTINE X 2)     Status: None   Collection Time    07/02/12  3:30 AM      Result Value Range Status   Specimen Description BLOOD LEFT HAND   Final   Special Requests BOTTLES DRAWN AEROBIC ONLY [facility]   Final   Culture  Setup Time 07/02/2012 07:34   Final   Culture NO GROWTH 5 DAYS   Final   Report Status 07/08/2012 FINAL   Final  CULTURE, BLOOD (ROUTINE X 2)     Status: None   Collection Time    07/02/12  3:45 AM      Result Value Range Status   Specimen Description  BLOOD LEFT HAND   Final   Special Requests BOTTLES DRAWN AEROBIC ONLY [facility]   Final   Culture  Setup Time 07/02/2012 07:34   Final   Culture NO GROWTH 5 DAYS   Final   Report Status 07/08/2012 FINAL   Final  BODY FLUID CULTURE     Status: None   Collection Time    07/02/12  5:57 AM      Result Value Range Status   Specimen Description FLUID PERITONEAL   Final   Special Requests Normal   Final   Gram Stain     Final   Value: ABUNDANT WBC PRESENT,BOTH PMN AND MONONUCLEAR     NO ORGANISMS SEEN   Culture     Final   Value: RARE YEAST     Note: CRITICAL RESULT CALLED TO, READ BACK BY AND VERIFIED WITH: NURSE AMARUGE@1431  ON [id number] BY [facility]   Report Status PENDING   Incomplete    Coagulation Studies: No results found for this basename: LABPROT, INR,  in the last 72 hours  Urinalysis: No results found for this basename: COLORURINE, APPERANCEUR, LABSPEC, PHURINE, GLUCOSEU, HGBUR, BILIRUBINUR, KETONESUR, PROTEINUR, UROBILINOGEN, NITRITE, LEUKOCYTESUR,  in the last 72 hours    Imaging: Dg Chest Port 1 View  07/08/2012  *RADIOLOGY REPORT*  Clinical Data: Check ET tube.  PORTABLE CHEST - 1 VIEW  Comparison: 07/06/2012  Findings: Endotracheal tube is 3 cm above the carina.  Interval removal of the left central line.  Right central line and NG tube are stable.  There is cardiomegaly with vascular congestion.  Worsening bilateral airspace disease, likely edema.  Low lung volumes with bibasilar atelectasis.  IMPRESSION: Worsening pulmonary edema pattern/CHF.   Original Report Authenticated By: Charlett Nose, M.D.      Medications:   . fentaNYL infusion INTRAVENOUS 200 mcg/hr (07/08/12 1426)   . alteplase  2 mg Intracatheter Once  .  antiseptic oral rinse  15 mL Mouth Rinse QID  . aspirin  325 mg Per Tube Daily  . calcitRIOL  0.25 mcg Oral Custom  . darbepoetin (ARANESP) injection - DIALYSIS  100 mcg Intravenous Q Fri-HD  . dialysis solution for CAPD/[facility])   Peritoneal Dialysis Once in  dialysis  . dialysis solution 2.5% low-MG/low-CA   Intraperitoneal Once in dialysis  . lanthanum  1,000 mg Oral TID WC  . micafungin (MYCAMINE) IV  100 mg Intravenous Daily  . pantoprazole sodium  40 mg Per Tube Q1200  . sodium chloride  3 mL Intravenous Q12H   sodium chloride, sodium chloride, sodium chloride, sodium chloride, sodium chloride, fentaNYL, heparin, midazolam, simethicone, sodium chloride  Assessment/ Plan:  1. Septic shock / fungal peritonitis- s/p PD cath removal today. Appreciate assistance of surgery, CCM and [facility].  On micafungin per ID rec's, no renal adjustment needed.  2. ESRD- usual modality is CCPD, on HD now due to #1. HD today for volume removal as tolerated. Has temp cath R IJ for HD.  3. VDRF- bilat new pulm infiltrates, pulm edema due to vol excess and/or ALI 4. 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 HTN/volume- Was not on any BP meds at home [facility] listed, but pt had stopped). See above also   [name]  MD [phone] pgr    [phone] cell 07/08/2012, 2:47 PM

## 2012-07-08 NOTE — Anesthesia Preprocedure Evaluation (Addendum)
Anesthesia Evaluation  Patient identified by MRN, date of birth, ID band Patient awake    Reviewed: Allergy & Precautions, H&P , NPO status , Patient's Chart, lab work & pertinent test results, reviewed documented beta blocker date and time   History of Anesthesia Complications Negative for: history of anesthetic complications  Airway Mallampati: II TM Distance: >3 FB Neck ROM: Full    Dental  (+) Teeth Intact   Pulmonary neg pulmonary ROS,    + decreased breath sounds- wheezing  rales    Cardiovascular hypertension, +CHF Rhythm:Regular Rate:Normal     Neuro/Psych PSYCHIATRIC DISORDERS Anxiety negative neurological ROS     GI/Hepatic Neg liver ROS,   Endo/Other  Morbid obesity  Renal/GU CRFRenal disease     Musculoskeletal   Abdominal   Peds  Hematology   Anesthesia Other Findings   Reproductive/Obstetrics                         Anesthesia Physical Anesthesia Plan  ASA: IV and emergent  Anesthesia Plan: General   Post-op Pain Management:    Induction: Intravenous and Rapid sequence  Airway Management Planned: Oral ETT  Additional Equipment:   Intra-op Plan:   Post-operative Plan: Post-operative intubation/ventilation  Informed Consent: I have reviewed the patients History and Physical, chart, labs and discussed the procedure including the risks, benefits and alternatives for the proposed anesthesia with the patient or authorized representative who has indicated his/her understanding and acceptance.   Dental advisory given and Consent reviewed with POA  Plan Discussed with: CRNA, Anesthesiologist and Surgeon  Anesthesia Plan Comments: (Discussed pt's respiratory status with pt and surgeon.  He will require post op ventilation due to his present pulmonary status.)        Anesthesia Quick Evaluation

## 2012-07-08 NOTE — Anesthesia Procedure Notes (Signed)
Procedure Name: Intubation Date/Time: 07/08/2012 9:26 AM Performed by: Jerilee Hoh Pre-anesthesia Checklist: Patient identified, Emergency Drugs available, Suction available and Patient being monitored Patient Re-evaluated:Patient Re-evaluated prior to inductionOxygen Delivery Method: Circle system utilized Preoxygenation: Pre-oxygenation with 100% oxygen Intubation Type: IV induction Ventilation: Mask ventilation without difficulty Grade View: Grade I Tube type: Oral Tube size: 8.0 mm Number of attempts: 1 Airway Equipment and Method: Video-laryngoscopy and Rigid stylet Placement Confirmation: ETT inserted through vocal cords under direct vision,  positive ETCO2 and breath sounds checked- equal and bilateral Secured at: 23 cm Tube secured with: Tape Dental Injury: Teeth and Oropharynx as per pre-operative assessment

## 2012-07-08 NOTE — Transfer of Care (Signed)
Immediate Anesthesia Transfer of Care Note  Patient: Geordan Xu Hornberger  Procedure(s) Performed: Procedure(s): MINOR REMOVAL OF PERITONEAL DIALYSIS CATHETER (N/A)  Patient Location: ICU  Anesthesia Type:General  Level of Consciousness: sedated, unresponsive and Patient remains intubated per anesthesia plan  Airway & Oxygen Therapy: Patient remains intubated per anesthesia plan and Patient placed on Ventilator (see vital sign flow sheet for setting)  Post-op Assessment: Report given to PACU RN and Post -op Vital signs reviewed and stable  Post vital signs: Reviewed and stable  Complications: No apparent anesthesia complications

## 2012-07-08 NOTE — Progress Notes (Signed)
[facility] PROGRESS NOTE Interim History: 53 y.o. male. With ESRD on PD, admitted with respiratory failure that occurred during dialysis. He had been given Narcan with little improvement and ultimately needed to be intubated by critical care medicine on April 13. Blood cultures were drawn and a culture was also taken from his peritoneal fluid. He was started on vancomycin and Zosyn. Since then his peritoneal fluid is growing a yeast. We have been consult to assist in management this patient who is now been diagnosed with likely candidal infection of his peritoneal dialysis catheter and candidal peritonitis. Fungal infection of peritoneal dialysis catheters is an indication for removal of peritoneal dialysis catheter and Central, surgery has been consulted    Assessment/Plan: Acute respiratory failure with hypercapnia: - most likely due to pulmonary edema and possible PNA. Off bipap. - > 1.0 L on HD. - currently on Zosyn  Severe sepsis/Fungal peritonitis/Leukocytosis: - Zosyn and Micafungin, leukocytosis improved. - ID consulted. - Peritoneal fluid: yeast: Awaiting speciation.started on Micafungin 4.18.2014.  - Peritoneal catheter needs to be removed schedule on 4.20.2014   - Pulmonary HTN- 50 mmHg 2011 ECHO/  Obesity hypoventilation syndrome  Systolic congestive heart failure with reduced left ventricular function, NYHA class 1- EF 40-45% 2011 ECHO - stable.   CKD (chronic kidney disease) requiring chronic PERITONEAL dialysis - Per renal - HD MWF - monitor Hbg.  Anemia of chronic disease: - cont to monitor Hbg. Management per renal. - I will go ahead and transfused.   HTN (hypertension): - Blood pressure stable.   Code Status: full  Family Communication: no family at bedside.  Disposition Plan: SDU    Consultants:  ID  Nephrology  PCCM  Procedures: 4/13 peritoneal fluid Cx >> ngtd 4/17 >>  4/13 BCx>> ngtd >>    Antibiotics: 4/13 rocephin >>>4/13  4/13  azithromax >>>4/13  4/13 IV Vanc >>> 4.17.2014 4/13 IV Zosyn >>> Micafungin 4.18.2014>>  HPI/Subjective: No complains.   Objective: Filed Vitals:   07/08/12 0324 07/08/12 0325 07/08/12 0446 07/08/12 0730  BP:   138/87 129/70  Pulse: 95 95 97 93  Temp:   99.6 F (37.6 C) 98.4 F (36.9 C)  TempSrc:   Axillary Oral  Resp: 27 27 30 29   Height:      Weight:   144.8 kg (319 lb 3.6 oz)   SpO2: 97% 98% 100% 98%    Intake/Output Summary (Last 24 hours) at 07/08/12 0738 Last data filed at 07/08/12 0200  Gross per 24 hour  Intake   1393 ml  Output      0 ml  Net   1393 ml   Filed Weights   07/06/12 0500 07/06/12 1634 07/08/12 0446  Weight: 142 kg (313 lb 0.9 oz) 143.5 kg (316 lb 5.8 oz) 144.8 kg (319 lb 3.6 oz)    Exam:  General: Alert, awake, oriented x3, in no acute distress.  HEENT: No bruits, no goiter.  Heart: Regular rate and rhythm, without murmurs, rubs, gallops.  Lungs: Good air movement, crackles on her left. Abdomen: Soft, nontender, nondistended, positive bowel sounds.  Neuro: Grossly intact, nonfocal.   Data Reviewed: Basic Metabolic Panel:  Recent Labs Lab 07/02/12 0200 07/03/12 0358 07/04/12 0445 07/04/12 2300 07/05/12 0420 07/06/12 0440  NA 132* 136 139 137 137 138  K 3.4* 3.0* 4.4 4.1 4.5 3.9  CL 87* 96 101 99 99 100  CO2 23 26 27 25 25 29   GLUCOSE 111* 103* 101* 110* 121* 95  BUN 65* 47* 29*  34* 38* 28*  CREATININE 17.82* 12.57* 8.36* 10.20* 11.03* 7.93*  CALCIUM 8.6 8.6 8.5 8.5 8.7 8.5  MG 1.9  --  2.5  --  2.3  --   PHOS  --  5.4* 5.7*  --  5.5*  --    Liver Function Tests:  Recent Labs Lab 07/01/12 1627 07/03/12 0358 07/04/12 0445  AST 30  --   --   ALT 33  --   --   ALKPHOS 58  --   --   BILITOT 0.3  --   --   PROT 7.6  --   --   ALBUMIN 2.6* 2.2* 2.3*    Recent Labs Lab 07/01/12 1627  LIPASE 41   No results found for this basename: AMMONIA,  in the last 168 hours CBC:  Recent Labs Lab 07/01/12 1627   07/03/12 0358 07/04/12 0445 07/04/12 2300 07/05/12 0420 07/06/12 0440  WBC 8.3  < > 7.5 9.2 12.0* 17.0* 11.7*  NEUTROABS 6.3  --   --   --   --   --   --   HGB 9.9*  < > 8.6* 8.5* 8.5* 8.0* 7.2*  HCT 30.3*  < > 26.3* 28.0* 27.4* 25.2* 23.2*  MCV 93.2  < > 91.6 96.6 96.1 95.8 96.3  PLT 178  < > 153 171 218 172 150  < > = values in this interval not displayed. Cardiac Enzymes:  Recent Labs Lab 07/01/12 2123 07/04/12 2300  TROPONINI <0.30 <0.30   BNP (last 3 results)  Recent Labs  09/07/11 2000 06/19/12 1927  PROBNP 24858.0* 3703.0*   CBG:  Recent Labs Lab 07/02/12 1146 07/04/12 2305 07/07/12 1228 07/07/12 1559 07/07/12 2129  GLUCAP 89 98 118* 107* 102*    Recent Results (from the past 240 hour(s))  MRSA PCR SCREENING     Status: None   Collection Time    07/02/12 12:17 AM      Result Value Range Status   MRSA by PCR NEGATIVE  NEGATIVE Final   Comment:            The GeneXpert MRSA Assay (FDA     approved for NASAL specimens     only), is one component of a     comprehensive MRSA colonization     surveillance program. It is not     intended to diagnose MRSA     infection nor to guide or     monitor treatment for     MRSA infections.  CULTURE, BLOOD (ROUTINE X 2)     Status: None   Collection Time    07/02/12  3:30 AM      Result Value Range Status   Specimen Description BLOOD LEFT HAND   Final   Special Requests BOTTLES DRAWN AEROBIC ONLY 6CC   Final   Culture  Setup Time 07/02/2012 07:34   Final   Culture     Final   Value:        BLOOD CULTURE RECEIVED NO GROWTH TO DATE CULTURE WILL BE HELD FOR 5 DAYS BEFORE ISSUING A FINAL NEGATIVE REPORT   Report Status PENDING   Incomplete  CULTURE, BLOOD (ROUTINE X 2)     Status: None   Collection Time    07/02/12  3:45 AM      Result Value Range Status   Specimen Description BLOOD LEFT HAND   Final   Special Requests BOTTLES DRAWN AEROBIC ONLY 3CC   Final   Culture  Setup  Time 07/02/2012 07:34   Final    Culture     Final   Value:        BLOOD CULTURE RECEIVED NO GROWTH TO DATE CULTURE WILL BE HELD FOR 5 DAYS BEFORE ISSUING A FINAL NEGATIVE REPORT   Report Status PENDING   Incomplete  BODY FLUID CULTURE     Status: None   Collection Time    07/02/12  5:57 AM      Result Value Range Status   Specimen Description FLUID PERITONEAL   Final   Special Requests Normal   Final   Gram Stain     Final   Value: ABUNDANT WBC PRESENT,BOTH PMN AND MONONUCLEAR     NO ORGANISMS SEEN   Culture     Final   Value: RARE YEAST     Note: CRITICAL RESULT CALLED TO, READ BACK BY AND VERIFIED WITH: NURSE AMARUGE@1431  ON [id number] BY [facility]   Report Status PENDING   Incomplete     Studies: No results found.  Scheduled Meds: . alteplase  2 mg Intracatheter Once  . antiseptic oral rinse  15 mL Mouth Rinse QID  . aspirin  325 mg Per Tube Daily  . calcitRIOL  0.25 mcg Oral Custom  . darbepoetin (ARANESP) injection - DIALYSIS  100 mcg Intravenous Q Fri-HD  . dialysis solution for CAPD/CCPD [facility])   Peritoneal Dialysis Once in dialysis  . dialysis solution 2.5% low-MG/low-CA   Intraperitoneal Once in dialysis  . lanthanum  1,000 mg Oral TID WC  . micafungin (MYCAMINE) IV  100 mg Intravenous Daily  . pantoprazole  40 mg Oral Q1200  . piperacillin-tazobactam (ZOSYN)  IV  2.25 g Intravenous Q8H  . sodium chloride  3 mL Intravenous Q12H   Continuous Infusions:    [name]  [facility] Pager [phone]. If 8PM-8AM, please contact night-coverage at [email or web address], password [facility] 07/08/2012, 7:38 AM  LOS: 7 days

## 2012-07-08 NOTE — Anesthesia Postprocedure Evaluation (Signed)
  Anesthesia Post-op Note  Patient: Gary Rivera  Procedure(s) Performed: Procedure(s): MINOR REMOVAL OF PERITONEAL DIALYSIS CATHETER (N/A)  Patient Location: ICU  Anesthesia Type:General  Level of Consciousness: sedated, unresponsive and Patient remains intubated per anesthesia plan  Airway and Oxygen Therapy: Patient placed on Ventilator (see vital sign flow sheet for setting)  Post-op Pain: none  Post-op Assessment: Post-op Vital signs reviewed, Patient's Cardiovascular Status Stable, Respiratory Function Stable and Patent Airway  Post-op Vital Signs: Reviewed and stable  Complications: No apparent anesthesia complications

## 2012-07-08 NOTE — Progress Notes (Signed)
PULMONARY  / CRITICAL CARE MEDICINE  Name: Gary Rivera MRN: [id number] DOB: [birth year]    ADMISSION DATE:  07/01/2012 CONSULTATION DATE:  07/01/12  REFERRING MD :  Dr. Juleen China PRIMARY SERVICE: PCCM  CHIEF COMPLAINT:  Acute respiratory failure post op  BRIEF PATIENT DESCRIPTION: Mr. Gary Rivera 52 yo AA man pmh of pulmonary HTN, systolic HF, ESRD on peritoneal dialysis, and OHS presented with abdominal pain after 8mg  of morphine developed acute respiratory failure with little improvement from narcan and refused bipap. pt was intubated and PCCM called to manage. Extubated. Comes back to 2100 post op resp failure after fungal peritonitis and catheter removal in OR.  SIGNIFICANT EVENTS / STUDIES:  4/13 Adm with dx sepsis, resp source suspected 4/13 CT abd: slight bibasilar infiltrates, no dilated bowels or acute process 4/13 CXR: bibasilar infiltrates, L effusion, under inflated lungs 4/14 Vasopressors and CRRT initiated 4/15 Extubated. 4/20- peritoneal catheter removal in OR, intubated  LINES / TUBES: ETT 4/13 >>4/15 L IJ CVL 4/14 >> R IJ HD cath >>   CULTURES: 4/14 peritoneal fluid Cx >> yeast>>> 4/13 sputum Cx >> 4/14 BCx>>>  ANTIBIOTICS: 4/13 rocephin >>>4/13 4/13 azithromax >>>4/13 4/13 IV Vanc >>>4/20 4/13 IV Zosyn >>>4/20 mycofungin 4/18>>>  SUBJECTIVE:  Vent post op  VITAL SIGNS: Temp:  [98 F (36.7 C)-99.7 F (37.6 C)] 98.4 F (36.9 C) (04/20 0730) Pulse Rate:  [82-100] 96 (04/20 0800) Resp:  [19-35] 34 (04/20 0800) BP: (101-171)/(59-89) 171/83 mmHg (04/20 0800) SpO2:  [85 %-100 %] 85 % (04/20 0800) FiO2 (%):  [50 %-90 %] 90 % (04/20 0730) Weight:  [144.8 kg (319 lb 3.6 oz)] 144.8 kg (319 lb 3.6 oz) (04/20 0446) HEMODYNAMICS:   VENTILATOR SETTINGS: Vent Mode:  [-]  FiO2 (%):  [50 %-90 %] 90 % INTAKE / OUTPUT: Intake/Output     04/19 0701 - 04/20 0700 04/20 0701 - 04/21 0700   P.O. 840    I.V. (mL/kg) 3 (0) 400 (2.8)   Blood 350    IV Piggyback 200 250    Total Intake(mL/kg) 1393 (9.6) 650 (4.5)   Other     Blood  50   Total Output   50   Net +1393 +600        Stool Occurrence 2 x     PHYSICAL EXAMINATION: General:  Sedated, RASS -5, just back from OR Neuro:  Sedated rass deep HEENT:  ETT Cardiovascular: S1 S2  RRR, no murmurs/rubs/gallops Lungs:  coarse Abdomen:  Distended, peritoneal site non-erythematous/non-indurated, no catheter present now Musculoskeletal:  intact Skin: dry  LABS:  Recent Labs Lab 07/01/12 1627  07/01/12 2123  07/02/12 0100 07/02/12 0200 07/02/12 0330 07/03/12 0358  07/04/12 0445 07/04/12 2300 07/04/12 2306 07/05/12 0006 07/05/12 0420 07/05/12 0542 07/06/12 0440 07/07/12 0500 07/08/12 0758  HGB 9.9*  --   --   --   --  10.2*  --  8.6*  --  8.5* 8.5*  --   --  8.0*  --  7.2*  --  8.5*  WBC 8.3  --   --   --   --  11.5*  --  7.5  --  9.2 12.0*  --   --  17.0*  --  11.7*  --  11.3*  PLT 178  --   --   --   --  219  --  153  --  171 218  --   --  172  --  150  --  163  NA  134*  --   --   --   --  132*  --  136  --  139 137  --   --  137  --  138  --   --   K 3.1*  --   --   --   --  3.4*  --  3.0*  --  4.4 4.1  --   --  4.5  --  3.9  --   --   CL 90*  --   --   --   --  87*  --  96  --  101 99  --   --  99  --  100  --   --   CO2 29  --   --   --   --  23  --  26  --  27 25  --   --  25  --  29  --   --   GLUCOSE 105*  --   --   --   --  111*  --  103*  --  101* 110*  --   --  121*  --  95  --   --   BUN 63*  --   --   --   --  65*  --  47*  --  29* 34*  --   --  38*  --  28*  --   --   CREATININE 17.74*  --   --   --   --  17.82*  --  12.57*  --  8.36* 10.20*  --   --  11.03*  --  7.93*  --   --   CALCIUM 8.5  --   --   --   --  8.6  --  8.6  --  8.5 8.5  --   --  8.7  --  8.5  --   --   MG  --   --   --   --   --  1.9  --   --   --  2.5  --   --   --  2.3  --   --   --   --   PHOS  --   --   --   --   --   --   --  5.4*  --  5.7*  --   --   --  5.5*  --   --   --   --   AST 30  --   --   --    --   --   --   --   --   --   --   --   --   --   --   --   --   --   ALT 33  --   --   --   --   --   --   --   --   --   --   --   --   --   --   --   --   --   ALKPHOS 58  --   --   --   --   --   --   --   --   --   --   --   --   --   --   --   --   --   BILITOT 0.3  --   --   --   --   --   --   --   --   --   --   --   --   --   --   --   --   --  PROT 7.6  --   --   --   --   --   --   --   --   --   --   --   --   --   --   --   --   --   ALBUMIN 2.6*  --   --   --   --   --   --  2.2*  --  2.3*  --   --   --   --   --   --   --   --   LATICACIDVEN  --   --   --   --   --   --  3.3*  --   --   --  0.8  --   --   --   --   --   --   --   TROPONINI  --   --  <0.30  --   --   --   --   --   --   --  <0.30  --   --   --   --   --   --   --   PROCALCITON  --   --   --   --  5.85  --   --   --   --   --   --   --   --   --   --   --  34.94  --   PHART  --   < >  --   < >  --   --   --   --   < >  --   --  7.248* 7.249*  --  7.238*  --   --   --   PCO2ART  --   < >  --   < >  --   --   --   --   < >  --   --  58.0* 60.0*  --  59.4*  --   --   --   PO2ART  --   < >  --   < >  --   --   --   --   < >  --   --  56.0* 72.0*  --  122.0*  --   --   --   < > = values in this interval not displayed.  Recent Labs Lab 07/04/12 2305 07/07/12 1228 07/07/12 1559 07/07/12 2129 07/08/12 0732  GLUCAP 98 118* 107* 102* 92    ASSESSMENT / PLAN:  PULMONARY A: Acute Resp failure (pre procedure) R/o ATX / pulm edema / ALI P:   - to 8 cc/kg -100% peep 5, abg to follow -STAT pcxr assess ett -dc flutter -will assess cvp for volume status -keep plat less 30, concern ALI ,assess cvp and ratio  CARDIOVASCULAR A: systolic heart failure EF 40% last Echo 6/13 R/o ischemia contribution P:  - assess cvp -failed propofol -r/o ischemia, assess trop, ecg  RENAL A:  ESRD, chronic peritoneal dialysis  P:   - per renal -cvp bemt now  GASTROINTESTINAL A: High risk stress P:   - post op = npo ppi  addition on vent  HEMATOLOGIC A:risk dvt P:  -Monitor CBC intermittently scd  INFECTIOUS A:  Yeast peritonitis Severe sepsis P:   - continue myco Follow new OR cultures D/w ID, dc vanc, zosyn  ENDOCRINE A:  At risk hypoglycemia P:   -  Cont to monitor. - Initiate SSI for glu > 180, ssi  NEUROLOGIC A:  Agitated, high risk delirium P:   -add fent drip, int versed - Klonipin dc -dc benadryl -WUA  TODAY'S SUMMARY: Vent management, dc vanc, zosyn, abg  Ccm time 35 min   Mcarthur Rossetti. Tyson Alias, MD, FACP Pgr: [phone] [facility]

## 2012-07-08 NOTE — Preoperative (Signed)
Beta Blockers   Reason not to administer Beta Blockers:Not Applicable 

## 2012-07-09 ENCOUNTER — Inpatient Hospital Stay (HOSPITAL_COMMUNITY): Payer: Medicare Other

## 2012-07-09 DIAGNOSIS — K659 Peritonitis, unspecified: Secondary | ICD-10-CM

## 2012-07-09 DIAGNOSIS — Z5189 Encounter for other specified aftercare: Secondary | ICD-10-CM

## 2012-07-09 DIAGNOSIS — I953 Hypotension of hemodialysis: Secondary | ICD-10-CM

## 2012-07-09 DIAGNOSIS — T8571XA Infection and inflammatory reaction due to peritoneal dialysis catheter, initial encounter: Secondary | ICD-10-CM

## 2012-07-09 LAB — RENAL FUNCTION PANEL
Albumin: 2.4 g/dL — ABNORMAL LOW (ref 3.5–5.2)
Albumin: 2.4 g/dL — ABNORMAL LOW (ref 3.5–5.2)
BUN: 48 mg/dL — ABNORMAL HIGH (ref 6–23)
BUN: 51 mg/dL — ABNORMAL HIGH (ref 6–23)
CO2: 24 mEq/L (ref 19–32)
Chloride: 100 mEq/L (ref 96–112)
Chloride: 102 mEq/L (ref 96–112)
Creatinine, Ser: 12.44 mg/dL — ABNORMAL HIGH (ref 0.50–1.35)
GFR calc non Af Amer: 4 mL/min — ABNORMAL LOW (ref >90)
Glucose, Bld: 91 mg/dL (ref 70–99)
Potassium: 3.7 mEq/L (ref 3.5–5.1)

## 2012-07-09 LAB — CBC
HCT: 23.3 % — ABNORMAL LOW (ref 39.0–52.0)
MCV: 89.6 fL (ref 78.0–100.0)
Platelets: 151 10*3/uL (ref 150–400)
RBC: 2.6 MIL/uL — ABNORMAL LOW (ref 4.22–5.81)
RDW: 18.4 % — ABNORMAL HIGH (ref 11.5–15.5)
WBC: 9 10*3/uL (ref 4.0–10.5)

## 2012-07-09 LAB — GLUCOSE, CAPILLARY
Glucose-Capillary: 93 mg/dL (ref 70–99)
Glucose-Capillary: 74 mg/dL (ref 70–99)
Glucose-Capillary: 75 mg/dL (ref 70–99)

## 2012-07-09 MED ORDER — CLONAZEPAM 0.5 MG PO TABS
0.5000 mg | ORAL_TABLET | Freq: Two times a day (BID) | ORAL | Status: DC | PRN
  Administered 2012-07-09 – 2012-07-13 (×5): 0.5 mg via ORAL
  Filled 2012-07-09 (×5): qty 1

## 2012-07-09 MED ORDER — BISOPROLOL FUMARATE 5 MG PO TABS
2.5000 mg | ORAL_TABLET | Freq: Every day | ORAL | Status: DC
  Administered 2012-07-12 – 2012-07-14 (×3): 2.5 mg via ORAL
  Filled 2012-07-09 (×8): qty 0.5

## 2012-07-09 MED ORDER — DEXTROSE 50 % IV SOLN
25.0000 mL | Freq: Once | INTRAVENOUS | Status: AC | PRN
  Administered 2012-07-09: 25 mL via INTRAVENOUS

## 2012-07-09 MED ORDER — ALBUTEROL SULFATE (5 MG/ML) 0.5% IN NEBU
INHALATION_SOLUTION | RESPIRATORY_TRACT | Status: AC
  Administered 2012-07-09: 2.5 mg via RESPIRATORY_TRACT
  Filled 2012-07-09: qty 0.5

## 2012-07-09 MED ORDER — RACEPINEPHRINE HCL 2.25 % IN NEBU
INHALATION_SOLUTION | RESPIRATORY_TRACT | Status: AC
  Administered 2012-07-09: 0.5 mL via RESPIRATORY_TRACT
  Filled 2012-07-09: qty 0.5

## 2012-07-09 MED ORDER — ALBUTEROL SULFATE (5 MG/ML) 0.5% IN NEBU: 2.5000 mg | INHALATION_SOLUTION | RESPIRATORY_TRACT | Status: DC | PRN

## 2012-07-09 MED ORDER — FENTANYL CITRATE 0.05 MG/ML IJ SOLN: 12.5000 ug | INTRAMUSCULAR | Status: DC | PRN

## 2012-07-09 MED ORDER — RACEPINEPHRINE HCL 2.25 % IN NEBU: 0.5000 mL | INHALATION_SOLUTION | Freq: Once | RESPIRATORY_TRACT | Status: AC

## 2012-07-09 MED ORDER — CHLORHEXIDINE GLUCONATE 0.12 % MT SOLN
15.0000 mL | Freq: Two times a day (BID) | OROMUCOSAL | Status: DC
  Administered 2012-07-09: 15 mL via OROMUCOSAL
  Filled 2012-07-09: qty 15

## 2012-07-09 MED ORDER — DEXTROSE 50 % IV SOLN
INTRAVENOUS | Status: AC
  Filled 2012-07-09: qty 50

## 2012-07-09 MED ORDER — ASPIRIN 325 MG PO TABS
325.0000 mg | ORAL_TABLET | Freq: Every day | ORAL | Status: DC
  Administered 2012-07-10 – 2012-07-16 (×6): 325 mg via ORAL
  Filled 2012-07-09 (×7): qty 1

## 2012-07-09 MED ORDER — PANTOPRAZOLE SODIUM 40 MG PO TBEC
40.0000 mg | DELAYED_RELEASE_TABLET | Freq: Every day | ORAL | Status: DC
  Administered 2012-07-09 – 2012-07-15 (×7): 40 mg via ORAL
  Filled 2012-07-09 (×7): qty 1

## 2012-07-09 MED ORDER — BIOTENE DRY MOUTH MT LIQD: 15.0000 mL | Freq: Four times a day (QID) | OROMUCOSAL | Status: DC

## 2012-07-09 MED ORDER — BIOTENE DRY MOUTH MT LIQD: 15.0000 mL | Freq: Two times a day (BID) | OROMUCOSAL | Status: DC

## 2012-07-09 MED ORDER — METHYLPREDNISOLONE SODIUM SUCC 40 MG IJ SOLR
20.0000 mg | INTRAMUSCULAR | Status: DC
  Administered 2012-07-09 – 2012-07-10 (×3): 20 mg via INTRAVENOUS
  Filled 2012-07-09 (×4): qty 0.5

## 2012-07-09 MED ORDER — BIOTENE DRY MOUTH MT LIQD
15.0000 mL | Freq: Two times a day (BID) | OROMUCOSAL | Status: DC
  Administered 2012-07-10 – 2012-07-11 (×2): 15 mL via OROMUCOSAL

## 2012-07-09 MED ORDER — PANTOPRAZOLE SODIUM 40 MG PO PACK
40.0000 mg | PACK | Freq: Every day | ORAL | Status: DC
  Filled 2012-07-09: qty 20

## 2012-07-09 MED ORDER — ALBUTEROL SULFATE (5 MG/ML) 0.5% IN NEBU
2.5000 mg | INHALATION_SOLUTION | RESPIRATORY_TRACT | Status: DC
  Administered 2012-07-09 – 2012-07-10 (×4): 2.5 mg via RESPIRATORY_TRACT
  Filled 2012-07-09 (×6): qty 0.5

## 2012-07-09 NOTE — Progress Notes (Signed)
Post-extubation stridor.  Racemic Epi ordered.  Solu-Medrol 20 IV q4h x 4 ordered.

## 2012-07-09 NOTE — Progress Notes (Signed)
[facility]   Day #3 micafungin   Subjective: No new complaints   Antibiotics:  Anti-infectives   Start     Dose/Rate Route Frequency Ordered Stop   07/07/12 2200  vancomycin (VANCOCIN) 1,250 mg in sodium chloride 0.9 % 250 mL IVPB  Status:  Discontinued     1,250 mg 250 mL/hr over 60 Minutes Intravenous  Once 07/07/12 2158 07/07/12 2206   07/06/12 1545  micafungin (MYCAMINE) 100 mg in sodium chloride 0.9 % 100 mL IVPB     100 mg 100 mL/hr over 1 Hours Intravenous Daily 07/06/12 1541     07/05/12 1800  vancomycin (VANCOCIN) IVPB 750 mg/150 ml premix  Status:  Discontinued     750 mg 150 mL/hr over 60 Minutes Intravenous  Once 07/05/12 1352 07/05/12 1355   07/05/12 1600  vancomycin (VANCOCIN) IVPB 750 mg/150 ml premix     750 mg 150 mL/hr over 60 Minutes Intravenous  Once 07/05/12 1355 07/05/12 1956   07/04/12 2100  piperacillin-tazobactam (ZOSYN) IVPB 2.25 g  Status:  Discontinued     2.25 g 100 mL/hr over 30 Minutes Intravenous 3 times per day 07/04/12 1120 07/08/12 1103   07/02/12 2200  piperacillin-tazobactam (ZOSYN) IVPB 3.375 g  Status:  Discontinued     3.375 g 12.5 mL/hr over 240 Minutes Intravenous Every 6 hours 07/02/12 1703 07/04/12 1120   07/02/12 2000  vancomycin (VANCOCIN) 1,500 mg in sodium chloride 0.9 % 500 mL IVPB  Status:  Discontinued     1,500 mg 250 mL/hr over 120 Minutes Intravenous Every 24 hours 07/02/12 1708 07/03/12 1245   07/02/12 0800  piperacillin-tazobactam (ZOSYN) IVPB 2.25 g  Status:  Discontinued     2.25 g 100 mL/hr over 30 Minutes Intravenous Every 8 hours 07/02/12 0054 07/02/12 1703   07/02/12 0100  vancomycin (VANCOCIN) 2,500 mg in sodium chloride 0.9 % 500 mL IVPB     2,500 mg 250 mL/hr over 120 Minutes Intravenous Every 12 hours 07/02/12 0054 07/02/12 0409   07/02/12 0100  piperacillin-tazobactam (ZOSYN) IVPB 2.25 g     2.25 g 100 mL/hr over 30 Minutes Intravenous  Once 07/02/12 0054 07/02/12 0519   07/01/12 2245  cefTRIAXone (ROCEPHIN) 1 g in dextrose 5 % 50 mL IVPB     1 g 100 mL/hr over 30 Minutes Intravenous  Once 07/01/12 2236 07/01/12 2336   07/01/12 2245  azithromycin (ZITHROMAX) 500 mg in dextrose 5 % 250 mL IVPB  Status:  Discontinued     500 mg 250 mL/hr over 60 Minutes Intravenous  Once 07/01/12 2236 07/02/12 0218      Medications: Scheduled Meds: . albuterol  2.5 mg Nebulization Q4H  . alteplase  2 mg Intracatheter Once  . antiseptic oral rinse  15 mL Mouth Rinse BID  . [START ON 07/10/2012] aspirin  325 mg Oral Daily  . bisoprolol  2.5 mg Oral Daily  . calcitRIOL  0.25 mcg Oral Custom  . darbepoetin (ARANESP) injection - DIALYSIS  100 mcg Intravenous Q Fri-HD  . lanthanum  1,000 mg Oral TID WC  . micafungin (MYCAMINE) IV  100 mg Intravenous Daily  . pantoprazole  40 mg Oral QHS  . sodium chloride  3 mL Intravenous Q12H   Continuous Infusions:  PRN Meds:.sodium chloride, sodium chloride, sodium chloride, sodium chloride, acetaminophen (TYLENOL) oral liquid 160 mg/5 mL, clonazePAM, feeding supplement (NEPRO CARB STEADY), fentaNYL, heparin, heparin, lidocaine (PF), lidocaine-prilocaine, pentafluoroprop-tetrafluoroeth, simethicone, sodium chloride   Objective: Weight  change: 1 lb 15.7 oz (0.9 kg)  Intake/Output Summary (Last 24 hours) at 07/09/12 1936 Last data filed at 07/09/12 1100  Gross per 24 hour  Intake 383.42 ml  Output      0 ml  Net 383.42 ml   Blood pressure 183/87, pulse 98, temperature 98.2 F (36.8 C), temperature source Oral, resp. rate 19, height 6' (1.829 m), weight 312 lb 2.7 oz (141.6 kg), SpO2 98.00%. Temp:  [97.6 F (36.4 C)-101.4 F (38.6 C)] 98.2 F (36.8 C) (04/21 1553) Pulse Rate:  [81-101] 98 (04/21 1853) Resp:  [15-32] 19 (04/21 1853) BP: (91-183)/(58-112) 183/87 mmHg (04/21 1853) SpO2:  [80 %-100 %] 98 % (04/21 1853) FiO2 (%):  [40 %-60 %] 40 % (04/21 1853)  Physical Exam: General: Alert and awake, oriented x3, not in any  acute distress.  HEENT: anicteric sclera, pupils reactive to light and accommodation, EOMI, oropharynx clear and without exudate, neck with HD catheter  CVS regular rate, normal r, no murmur rubs or gallops  Chest: clear to auscultation bilaterally, no wheezing, rales or rhonchi  Abdomen: Protuberant diffusely distended and tender to palpation throughout without rebound positive bowel sounds , bandages from PD catheter removal Skin: no rashes  Neuro: nonfocal, strength and sensation intact  Lab Results:  Recent Labs  07/08/12 0758 07/09/12 0400  WBC 11.3* 9.0  HGB 8.5* 7.6*  HCT 27.4* 23.3*  PLT 163 151    BMET  Recent Labs  07/09/12 0400 07/09/12 1100  NA 141 143  K 3.7 4.0  CL 100 102  CO2 24 25  GLUCOSE 78 91  BUN 48* 51*  CREATININE 11.39* 12.44*  CALCIUM 9.2 9.3    Micro Results: Recent Results (from the past 240 hour(s))  MRSA PCR SCREENING     Status: None   Collection Time    07/02/12 12:17 AM      Result Value Range Status   MRSA by PCR NEGATIVE  NEGATIVE Final   Comment:            The GeneXpert MRSA Assay (FDA     approved for NASAL specimens     only), is one component of a     comprehensive MRSA colonization     surveillance program. It is not     intended to diagnose MRSA     infection nor to guide or     monitor treatment for     MRSA infections.  CULTURE, BLOOD (ROUTINE X 2)     Status: None   Collection Time    07/02/12  3:30 AM      Result Value Range Status   Specimen Description BLOOD LEFT HAND   Final   Special Requests BOTTLES DRAWN AEROBIC ONLY [facility]   Final   Culture  Setup Time 07/02/2012 07:34   Final   Culture NO GROWTH 5 DAYS   Final   Report Status 07/08/2012 FINAL   Final  CULTURE, BLOOD (ROUTINE X 2)     Status: None   Collection Time    07/02/12  3:45 AM      Result Value Range Status   Specimen Description BLOOD LEFT HAND   Final   Special Requests BOTTLES DRAWN AEROBIC ONLY 3CC   Final   Culture  Setup Time 07/02/2012  07:34   Final   Culture NO GROWTH 5 DAYS   Final   Report Status 07/08/2012 FINAL   Final  BODY FLUID CULTURE     Status: None  Collection Time    07/02/12  5:57 AM      Result Value Range Status   Specimen Description FLUID PERITONEAL   Final   Special Requests Normal   Final   Gram Stain     Final   Value: ABUNDANT WBC PRESENT,BOTH PMN AND MONONUCLEAR     NO ORGANISMS SEEN   Culture     Final   Value: RARE YEAST     Note: CRITICAL RESULT CALLED TO, READ BACK BY AND VERIFIED WITH: NURSE AMARUGE@1431  ON [id number] BY [facility]   Report Status PENDING   Incomplete  FUNGUS CULTURE W SMEAR     Status: None   Collection Time    07/08/12  8:30 AM      Result Value Range Status   Specimen Description CATH TIP   Final   Special Requests POF ZOSYN   Final   Fungal Smear FEW YEAST   Final   Culture CULTURE IN PROGRESS FOR FOUR WEEKS   Final   Report Status PENDING   Incomplete  CATH TIP CULTURE     Status: None   Collection Time    07/08/12  8:30 AM      Result Value Range Status   Specimen Description CATH TIP   Final   Special Requests POF ZOSYN   Final   Culture NO GROWTH   Final   Report Status PENDING   Incomplete  MRSA PCR SCREENING     Status: Abnormal   Collection Time    07/08/12 11:01 AM      Result Value Range Status   MRSA by PCR INVALID RESULTS, SPECIMEN SENT FOR CULTURE (*) NEGATIVE Final   Comment:            The GeneXpert MRSA Assay (FDA     approved for NASAL specimens     only), is one component of a     comprehensive MRSA colonization     surveillance program. It is not     intended to diagnose MRSA     infection nor to guide or     monitor treatment for     MRSA infections.    Studies/Results: Dg Chest Port 1 View  07/09/2012  *RADIOLOGY REPORT*  Clinical Data: Respiratory failure and pulmonary edema.  PORTABLE CHEST - 1 VIEW  Comparison: 07/08/2012  Findings: Endotracheal tube tip projects approximately 1.5 cm above the carina.  Nasogastric tube extends below  the diaphragm.  Large bore right jugular central venous catheter tip remains in the SVC. Lungs show improved volumes bilaterally with residual suggestion of mild edema and bibasilar atelectasis present.  No large pleural effusions are seen.  IMPRESSION: Improved aeration bilaterally with suspected residual mild edema.   Original Report Authenticated By: Irish Lack, M.D.    Dg Chest Port 1 View  07/08/2012  *RADIOLOGY REPORT*  Clinical Data: Check ET tube.  PORTABLE CHEST - 1 VIEW  Comparison: 07/06/2012  Findings: Endotracheal tube is 3 cm above the carina.  Interval removal of the left central line.  Right central line and NG tube are stable.  There is cardiomegaly with vascular congestion.  Worsening bilateral airspace disease, likely edema.  Low lung volumes with bibasilar atelectasis.  IMPRESSION: Worsening pulmonary edema pattern/CHF.   Original Report Authenticated By: Charlett Nose, M.D.       Assessment/Plan: Gary Rivera is a 52 y.o. male with with fungal peritonitis associated with PD catheter   #1 Fungal peritonitis:  --cotninue mycafungin  --fu speciation  of organism if C albicans can change toHIGH dose fluconazole since org likely to be susceptible. If C glabrata would worry about azole R and continue micafungin  --she needs two weeks of effective antifungal therapy post PD catheter removal, 13 more days   --c#2 Screening: HIV negative    LOS: 8 days   Acey Lav 07/09/2012, 7:36 PM

## 2012-07-09 NOTE — Therapy (Signed)
Racimic epi given for stridor. Pt placed back on bipap 12/6 and 40%.

## 2012-07-09 NOTE — Progress Notes (Signed)
Found pt. On bipap on these settings. Pt is tolerating well at this time.

## 2012-07-09 NOTE — Progress Notes (Addendum)
PULMONARY  / CRITICAL CARE MEDICINE  Name: Gary Rivera MRN: [id number] DOB: Mar 12, 1961    ADMISSION DATE:  07/01/2012 CONSULTATION DATE:  07/01/12  REFERRING MD :  Dr. Juleen China PRIMARY SERVICE: PCCM  CHIEF COMPLAINT:  Acute respiratory failure post op  BRIEF PATIENT DESCRIPTION: Mr. Gary Rivera 52 yo AA man pmh of pulmonary HTN, systolic HF, ESRD on peritoneal dialysis, and OHS presented with abdominal pain after 8mg  of morphine developed acute respiratory failure with little improvement from narcan and refused bipap. pt was intubated and PCCM called to manage. Extubated. Comes back to 2100 post op resp failure after fungal peritonitis and catheter removal in OR.  SIGNIFICANT EVENTS / STUDIES:  4/13 Adm with dx sepsis, resp source suspected 4/13 CT abd: slight bibasilar infiltrates, no dilated bowels or acute process 4/13 CXR: bibasilar infiltrates, L effusion, under inflated lungs 4/14 Vasopressors and CRRT initiated 4/15 Extubated. 4/20- peritoneal catheter removal in OR, intubated 4/21- 1st HD 4/20, tolerated well, 4L removed.  LINES / TUBES: ETT 4/13 >>4/15 L IJ CVL 4/14 >> R IJ HD cath >>  ETT 4/21>>4/21  CULTURES: 4/14 peritoneal fluid Cx >> yeast>>> 4/13 sputum Cx >neg 4/14 BCx>>>neg  ANTIBIOTICS: 4/13 rocephin >>>4/13 4/13 azithromax >>>4/13 4/13 IV Vanc >>>4/20 4/13 IV Zosyn >>>4/20 mycofungin 4/18>>>  SUBJECTIVE:  tol extubation well 4/21  VITAL SIGNS: Temp:  [98.3 F (36.8 C)-101.4 F (38.6 C)] 98.4 F (36.9 C) (04/21 0424) Pulse Rate:  [81-115] 86 (04/21 0630) Resp:  [17-34] 24 (04/21 0630) BP: (91-190)/(56-112) 131/76 mmHg (04/21 0630) SpO2:  [85 %-100 %] 98 % (04/21 0630) FiO2 (%):  [40 %-100 %] 60 % (04/21 0400) Weight:  [312 lb 2.7 oz (141.6 kg)-321 lb 3.4 oz (145.7 kg)] 312 lb 2.7 oz (141.6 kg) (04/20 1928) HEMODYNAMICS:   VENTILATOR SETTINGS: Vent Mode:  [-] PRVC FiO2 (%):  [40 %-100 %] 60 % Set Rate:  [14 bmp-24 bmp] 24 bmp Vt Set:  [600 mL]  600 mL PEEP:  [5 cmH20] 5 cmH20 Plateau Pressure:  [22 cmH20-32 cmH20] 32 cmH20 INTAKE / OUTPUT: Intake/Output     04/20 0701 - 04/21 0700 04/21 0701 - 04/22 0700   P.O.     I.V. (mL/kg) 888 (6.3)    Blood     NG/GT 70    IV Piggyback 250    Total Intake(mL/kg) 1208 (8.5)    Emesis/NG output 500    Other 4000    Blood 50    Total Output 4550     Net -3342           PHYSICAL EXAMINATION: General:  Awake and NAD Neuro:  Alert and oriented.  HEENT: , EOMI. ETT out Cardiovascular: S1 S2  RRR, no murmurs/rubs/gallops Lungs: diffuse coarse breathing b/l, no wheezes. Abdomen:  Distended, peritoneal site dressed, non-erythematous/non-indurated, no catheter present now Musculoskeletal:  intact Skin: dry Post extubation pt passed swallow well   LABS:  Recent Labs Lab 07/04/12 0445 07/04/12 2300  07/05/12 0420 07/05/12 0542 07/06/12 0440 07/07/12 0500 07/08/12 0758 07/08/12 1227 07/08/12 1228 07/08/12 1613 07/09/12 0400  HGB 8.5* 8.5*  --  8.0*  --  7.2*  --  8.5*  --   --   --  7.6*  WBC 9.2 12.0*  --  17.0*  --  11.7*  --  11.3*  --   --   --  9.0  PLT 171 218  --  172  --  150  --  163  --   --   --  151  NA 139 137  --  137  --  138  --   --  140 139  --  141  K 4.4 4.1  --  4.5  --  3.9  --   --  4.2 4.2  --  3.7  CL 101 99  --  99  --  100  --   --  99 99  --  100  CO2 27 25  --  25  --  29  --   --  26 26  --  24  GLUCOSE 101* 110*  --  121*  --  95  --   --  94 92  --  78  BUN 29* 34*  --  38*  --  28*  --   --  38* 38*  --  48*  CREATININE 8.36* 10.20*  --  11.03*  --  7.93*  --   --  10.17* 9.87*  --  11.39*  CALCIUM 8.5 8.5  --  8.7  --  8.5  --   --  9.1 8.9  --  9.2  MG 2.5  --   --  2.3  --   --   --   --   --   --   --   --   PHOS 5.7*  --   --  5.5*  --   --   --   --  6.9*  --   --  5.1*  ALBUMIN 2.3*  --   --   --   --   --   --   --  2.3*  --   --  2.4*  LATICACIDVEN  --  0.8  --   --   --   --   --   --   --   --   --   --   TROPONINI  --  <0.30   --   --   --   --   --   --   --  <0.30  --   --   PROCALCITON  --   --   --   --   --   --  34.94  --   --   --   --   --   PHART  --   --   < >  --  7.238*  --   --   --   --  7.208* 7.303*  --   PCO2ART  --   --   < >  --  59.4*  --   --   --   --  69.3* 51.3*  --   PO2ART  --   --   < >  --  122.0*  --   --   --   --  108.0* 63.0*  --   < > = values in this interval not displayed.  Recent Labs Lab 07/08/12 1956 07/08/12 2234 07/08/12 2356 07/09/12 0112 07/09/12 0355  GLUCAP 78 76 70 93 74    ASSESSMENT / PLAN: Principal Problem:   Acute respiratory failure with hypercapnia Active Problems:   Systolic congestive heart failure with reduced left ventricular function, NYHA class 1- EF 40-45% 2011 ECHO   CKD (chronic kidney disease) requiring chronic PERITONEAL dialysis   HTN (hypertension)   Hypercapnia   Pulmonary edema   Obesity hypoventilation syndrome   Septic shock(785.52)   Peritoneal dialysis catheter exit site infection   PULMONARY A:  Acute Resp failure (pre procedure) R/o ATX / pulm edema / ALI P:   - tol exubation 4/21 well -pcxr - improved aeration- decreased fluid overload. -will assess cvp for volume status  CARDIOVASCULAR A: systolic heart failure EF 40% last Echo 6/13 R/o ischemia contribution. Trop neg and unchanged EKG. Hypotensive with HD. P: Albumin 25 gm x1 received with HD. - no active intervention needed now.  RENAL A:  ESRD, chronic peritoneal dialysis- removed PD cath 4/20. Started HD 4/20. 4L removed. P:   - HD per renal  GASTROINTESTINAL A: High risk stress P:   - advance to clear liq diet, passes swallow easily 4/21 ppi for SUP  HEMATOLOGIC A:risk dvt, Hb- 7.6- at baseline. P:  -Monitor CBC intermittently - Scd  INFECTIOUS A:  Yeast peritonitis Severe sepsis- resolved. P:   - continue mycafungin Follow new OR cultures D/w ID, dc'd vanc, zosyn 4/20.  ENDOCRINE A:  At risk hypoglycemia P:   - Cont to monitor. - cont  SSI for glu > 180, ssi  NEUROLOGIC A:  Agitated, high risk delirium P:   - hold sedation now that is extubated  TODAY'S SUMMARY: 52 yo AAM with peritoneal dialysis and PD catheter fungal peritonitis s/p PD catheter removal. Need to stay on ICU as just was extubated 4/21.   Ccm time 35 min    Caryl Bis  [phone]  Cell  [phone]  If no response or cell goes to voicemail, call beeper [phone]

## 2012-07-09 NOTE — Progress Notes (Signed)
[facility] ROUNDING NOTE   Subjective:   Interval History: s/p PD cath removal yesterday, pulm edema yest better today after HD yest with 4kg off, BP's much better today , higher. Abd pain a lot better per pt, just "sore" now. For extubation this am  Objective:  Vital signs in last 24 hours:  Temp:  [98.4 F (36.9 C)-101.4 F (38.6 C)] 99.1 F (37.3 C) (04/21 0843) Pulse Rate:  [81-115] 92 (04/21 1000) Resp:  [15-32] 31 (04/21 1000) BP: (91-159)/(56-112) 145/81 mmHg (04/21 1000) SpO2:  [90 %-100 %] 92 % (04/21 1000) FiO2 (%):  [40 %-60 %] 40 % (04/21 1000) Weight:  [141.6 kg (312 lb 2.7 oz)-145.7 kg (321 lb 3.4 oz)] 141.6 kg (312 lb 2.7 oz) (04/20 1928)  Weight change: 0.9 kg (1 lb 15.7 oz) Filed Weights   07/08/12 0446 07/08/12 1546 07/08/12 1928  Weight: 144.8 kg (319 lb 3.6 oz) 145.7 kg (321 lb 3.4 oz) 141.6 kg (312 lb 2.7 oz)    Intake/Output: I/O last 3 completed shifts: In: 1278.5 [I.V.:908.5; NG/GT:70; IV Piggyback:300] Out: 4550 [Emesis/NG output:500; Other:4000; Blood:50]   Intake/Output this shift:  Total I/O In: 25.9 [I.V.:25.9] Out: -  Gen: on vent, sedated, BP 130/84 CVS- RRR systolic murmur R IJ temp cath 3-lumen RS- coarse BS bilat ABD- distended, PD cath is out EXT- trace LE edema   Basic Metabolic Panel:  Recent Labs Lab 07/03/12 0358 07/04/12 0445  07/05/12 0420 07/06/12 0440 07/08/12 1227 07/08/12 1228 07/09/12 0400  NA 136 139  < > 137 138 140 139 141  K 3.0* 4.4  < > 4.5 3.9 4.2 4.2 3.7  CL 96 101  < > 99 100 99 99 100  CO2 26 27  < > 25 29 26 26 24   GLUCOSE 103* 101*  < > 121* 95 94 92 78  BUN 47* 29*  < > 38* 28* 38* 38* 48*  CREATININE 12.57* 8.36*  < > 11.03* 7.93* 10.17* 9.87* 11.39*  CALCIUM 8.6 8.5  < > 8.7 8.5 9.1 8.9 9.2  MG  --  2.5  --  2.3  --   --   --   --   PHOS 5.4* 5.7*  --  5.5*  --  6.9*  --  5.1*  < > = values in this interval not displayed.  Liver Function Tests:  Recent Labs Lab  07/03/12 0358 07/04/12 0445 07/08/12 1227 07/09/12 0400  ALBUMIN 2.2* 2.3* 2.3* 2.4*   No results found for this basename: LIPASE, AMYLASE,  in the last 168 hours No results found for this basename: AMMONIA,  in the last 168 hours  CBC:  Recent Labs Lab 07/04/12 2300 07/05/12 0420 07/06/12 0440 07/08/12 0758 07/09/12 0400  WBC 12.0* 17.0* 11.7* 11.3* 9.0  HGB 8.5* 8.0* 7.2* 8.5* 7.6*  HCT 27.4* 25.2* 23.2* 27.4* 23.3*  MCV 96.1 95.8 96.3 92.3 89.6  PLT 218 172 150 163 151    Cardiac Enzymes:  Recent Labs Lab 07/04/12 2300 07/08/12 1228  TROPONINI <0.30 <0.30    BNP: No components found with this basename: POCBNP,   CBG:  Recent Labs Lab 07/08/12 2234 07/08/12 2356 07/09/12 0112 07/09/12 0355 07/09/12 0813  GLUCAP 76 70 93 74 75    Microbiology: Results for orders placed during the hospital encounter of 07/01/12  MRSA PCR SCREENING     Status: None   Collection Time    07/02/12 12:17 AM      Result Value Range  Status   MRSA by PCR NEGATIVE  NEGATIVE Final   Comment:            The GeneXpert MRSA Assay (FDA     approved for NASAL specimens     only), is one component of a     comprehensive MRSA colonization     surveillance program. It is not     intended to diagnose MRSA     infection nor to guide or     monitor treatment for     MRSA infections.  CULTURE, BLOOD (ROUTINE X 2)     Status: None   Collection Time    07/02/12  3:30 AM      Result Value Range Status   Specimen Description BLOOD LEFT HAND   Final   Special Requests BOTTLES DRAWN AEROBIC ONLY [facility]   Final   Culture  Setup Time 07/02/2012 07:34   Final   Culture NO GROWTH 5 DAYS   Final   Report Status 07/08/2012 FINAL   Final  CULTURE, BLOOD (ROUTINE X 2)     Status: None   Collection Time    07/02/12  3:45 AM      Result Value Range Status   Specimen Description BLOOD LEFT HAND   Final   Special Requests BOTTLES DRAWN AEROBIC ONLY [facility]   Final   Culture  Setup Time 07/02/2012  07:34   Final   Culture NO GROWTH 5 DAYS   Final   Report Status 07/08/2012 FINAL   Final  BODY FLUID CULTURE     Status: None   Collection Time    07/02/12  5:57 AM      Result Value Range Status   Specimen Description FLUID PERITONEAL   Final   Special Requests Normal   Final   Gram Stain     Final   Value: ABUNDANT WBC PRESENT,BOTH PMN AND MONONUCLEAR     NO ORGANISMS SEEN   Culture     Final   Value: RARE YEAST     Note: CRITICAL RESULT CALLED TO, READ BACK BY AND VERIFIED WITH: NURSE AMARUGE@1431  ON [id number] BY [facility]   Report Status PENDING   Incomplete  FUNGUS CULTURE W SMEAR     Status: None   Collection Time    07/08/12  8:30 AM      Result Value Range Status   Specimen Description CATH TIP   Final   Special Requests POF ZOSYN   Final   Fungal Smear FEW YEAST   Final   Culture CULTURE IN PROGRESS FOR FOUR WEEKS   Final   Report Status PENDING   Incomplete  CATH TIP CULTURE     Status: None   Collection Time    07/08/12  8:30 AM      Result Value Range Status   Specimen Description CATH TIP   Final   Special Requests POF ZOSYN   Final   Culture NO GROWTH   Final   Report Status PENDING   Incomplete  MRSA PCR SCREENING     Status: Abnormal   Collection Time    07/08/12 11:01 AM      Result Value Range Status   MRSA by PCR INVALID RESULTS, SPECIMEN SENT FOR CULTURE (*) NEGATIVE Final   Comment:            The GeneXpert MRSA Assay (FDA     approved for NASAL specimens     only), is one component of a  comprehensive MRSA colonization     surveillance program. It is not     intended to diagnose MRSA     infection nor to guide or     monitor treatment for     MRSA infections.    Coagulation Studies: No results found for this basename: LABPROT, INR,  in the last 72 hours  Urinalysis: No results found for this basename: COLORURINE, APPERANCEUR, LABSPEC, PHURINE, GLUCOSEU, HGBUR, BILIRUBINUR, KETONESUR, PROTEINUR, UROBILINOGEN, NITRITE, LEUKOCYTESUR,  in the last  72 hours    Imaging: Dg Chest Port 1 View  07/09/2012  *RADIOLOGY REPORT*  Clinical Data: Respiratory failure and pulmonary edema.  PORTABLE CHEST - 1 VIEW  Comparison: 07/08/2012  Findings: Endotracheal tube tip projects approximately 1.5 cm above the carina.  Nasogastric tube extends below the diaphragm.  Large bore right jugular central venous catheter tip remains in the SVC. Lungs show improved volumes bilaterally with residual suggestion of mild edema and bibasilar atelectasis present.  No large pleural effusions are seen.  IMPRESSION: Improved aeration bilaterally with suspected residual mild edema.   Original Report Authenticated By: Irish Lack, M.D.    Dg Chest Port 1 View  07/08/2012  *RADIOLOGY REPORT*  Clinical Data: Check ET tube.  PORTABLE CHEST - 1 VIEW  Comparison: 07/06/2012  Findings: Endotracheal tube is 3 cm above the carina.  Interval removal of the left central line.  Right central line and NG tube are stable.  There is cardiomegaly with vascular congestion.  Worsening bilateral airspace disease, likely edema.  Low lung volumes with bibasilar atelectasis.  IMPRESSION: Worsening pulmonary edema pattern/CHF.   Original Report Authenticated By: Charlett Nose, M.D.      Medications:     . alteplase  2 mg Intracatheter Once  . antiseptic oral rinse  15 mL Mouth Rinse QID  . [START ON 07/10/2012] aspirin  325 mg Oral Daily  . calcitRIOL  0.25 mcg Oral Custom  . chlorhexidine  15 mL Mouth Rinse BID  . darbepoetin (ARANESP) injection - DIALYSIS  100 mcg Intravenous Q Fri-HD  . dextrose      . lanthanum  1,000 mg Oral TID WC  . micafungin (MYCAMINE) IV  100 mg Intravenous Daily  . pantoprazole sodium  40 mg Oral Q1200  . sodium chloride  3 mL Intravenous Q12H   sodium chloride, sodium chloride, sodium chloride, sodium chloride, acetaminophen (TYLENOL) oral liquid 160 mg/5 mL, feeding supplement (NEPRO CARB STEADY), fentaNYL, heparin, heparin, lidocaine (PF),  lidocaine-prilocaine, pentafluoroprop-tetrafluoroeth, simethicone, sodium chloride  Assessment/ Plan:   1. Septic shock / fungal peritonitis- s/p PD cath removal 4/20. Appreciate assistance of surgery, CCM and [facility] doctors.  On micafungin per ID rec's, no renal adjustment needed. Looks better, BP higher, abd pain much better per pt 2. ESRD, transitioned to HD from PD. HD yest for pulm edema, mostly resolved by cxr today. Plan HD Tues. Will plan [facility] placement late in the wk then get temp cath out when more stable 3. 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 HTN/volume- not on BP meds at home.    Gary Moselle  MD [phone] pgr    [phone] cell 07/09/2012, 11:31 AM

## 2012-07-09 NOTE — Progress Notes (Signed)
[facility] Progress Note 1 Day Post-Op  Subjective: Patient alert on the ventilator.  Packing removed from the LUQ incision site per Dr. Arita Miss suggestion.  Objective: Vital signs in last 24 hours: Temp:  [98.3 F (36.8 C)-101.4 F (38.6 C)] 98.4 F (36.9 C) (04/21 0424) Pulse Rate:  [81-115] 90 (04/21 0800) Resp:  [15-27] 15 (04/21 0800) BP: (91-190)/(56-112) 126/78 mmHg (04/21 0800) SpO2:  [90 %-100 %] 95 % (04/21 0800) FiO2 (%):  [40 %-100 %] 40 % (04/21 0800) Weight:  [141.6 kg (312 lb 2.7 oz)-145.7 kg (321 lb 3.4 oz)] 141.6 kg (312 lb 2.7 oz) (04/20 1928) Last BM Date: 07/07/12  Intake/Output from previous day: 04/20 0701 - 04/21 0700 In: 1225.5 [I.V.:905.5; NG/GT:70; IV Piggyback:250] Out: 4550 [Emesis/NG output:500; Blood:50] Intake/Output this shift: Total I/O In: 15 [I.V.:15] Out: -   General: No acute distress.  Alert and able to write on the ventilator.  Lungs: Clear  Abd: No peritonitis.  LUQ wound site had blood drainage.  Will cover not with dry dressing.  Extremities: No changes  Neuro: Intact  Lab Results:  @LABLAST2 (wbc:2,hgb:2,hct:2,plt:2) BMET  Recent Labs  07/08/12 1228 07/09/12 0400  NA 139 141  K 4.2 3.7  CL 99 100  CO2 26 24  GLUCOSE 92 78  BUN 38* 48*  CREATININE 9.87* 11.39*  CALCIUM 8.9 9.2   PT/INR No results found for this basename: LABPROT, INR,  in the last 72 hours ABG  Recent Labs  07/08/12 1228 07/08/12 1613  PHART 7.208* 7.303*  HCO3 27.6* 25.2*    Studies/Results: Dg Chest Port 1 View  07/09/2012  *RADIOLOGY REPORT*  Clinical Data: Respiratory failure and pulmonary edema.  PORTABLE CHEST - 1 VIEW  Comparison: 07/08/2012  Findings: Endotracheal tube tip projects approximately 1.5 cm above the carina.  Nasogastric tube extends below the diaphragm.  Large bore right jugular central venous catheter tip remains in the SVC. Lungs show improved volumes bilaterally with residual suggestion of mild edema and bibasilar  atelectasis present.  No large pleural effusions are seen.  IMPRESSION: Improved aeration bilaterally with suspected residual mild edema.   Original Report Authenticated By: Irish Lack, M.D.    Dg Chest Port 1 View  07/08/2012  *RADIOLOGY REPORT*  Clinical Data: Check ET tube.  PORTABLE CHEST - 1 VIEW  Comparison: 07/06/2012  Findings: Endotracheal tube is 3 cm above the carina.  Interval removal of the left central line.  Right central line and NG tube are stable.  There is cardiomegaly with vascular congestion.  Worsening bilateral airspace disease, likely edema.  Low lung volumes with bibasilar atelectasis.  IMPRESSION: Worsening pulmonary edema pattern/CHF.   Original Report Authenticated By: Charlett Nose, M.D.     Anti-infectives: Anti-infectives   Start     Dose/Rate Route Frequency Ordered Stop   07/07/12 2200  vancomycin (VANCOCIN) 1,250 mg in sodium chloride 0.9 % 250 mL IVPB  Status:  Discontinued     1,250 mg 250 mL/hr over 60 Minutes Intravenous  Once 07/07/12 2158 07/07/12 2206   07/06/12 1545  micafungin (MYCAMINE) 100 mg in sodium chloride 0.9 % 100 mL IVPB     100 mg 100 mL/hr over 1 Hours Intravenous Daily 07/06/12 1541     07/05/12 1800  vancomycin (VANCOCIN) IVPB 750 mg/150 ml premix  Status:  Discontinued     750 mg 150 mL/hr over 60 Minutes Intravenous  Once 07/05/12 1352 07/05/12 1355   07/05/12 1600  vancomycin (VANCOCIN) IVPB 750 mg/150 ml premix  750 mg 150 mL/hr over 60 Minutes Intravenous  Once 07/05/12 1355 07/05/12 1956   07/04/12 2100  piperacillin-tazobactam (ZOSYN) IVPB 2.25 g  Status:  Discontinued     2.25 g 100 mL/hr over 30 Minutes Intravenous 3 times per day 07/04/12 1120 07/08/12 1103   07/02/12 2200  piperacillin-tazobactam (ZOSYN) IVPB 3.375 g  Status:  Discontinued     3.375 g 12.5 mL/hr over 240 Minutes Intravenous Every 6 hours 07/02/12 1703 07/04/12 1120   07/02/12 2000  vancomycin (VANCOCIN) 1,500 mg in sodium chloride 0.9 % 500 mL IVPB   Status:  Discontinued     1,500 mg 250 mL/hr over 120 Minutes Intravenous Every 24 hours 07/02/12 1708 07/03/12 1245   07/02/12 0800  piperacillin-tazobactam (ZOSYN) IVPB 2.25 g  Status:  Discontinued     2.25 g 100 mL/hr over 30 Minutes Intravenous Every 8 hours 07/02/12 0054 07/02/12 1703   07/02/12 0100  vancomycin (VANCOCIN) 2,500 mg in sodium chloride 0.9 % 500 mL IVPB     2,500 mg 250 mL/hr over 120 Minutes Intravenous Every 12 hours 07/02/12 0054 07/02/12 0409   07/02/12 0100  piperacillin-tazobactam (ZOSYN) IVPB 2.25 g     2.25 g 100 mL/hr over 30 Minutes Intravenous  Once 07/02/12 0054 07/02/12 0519   07/01/12 2245  cefTRIAXone (ROCEPHIN) 1 g in dextrose 5 % 50 mL IVPB     1 g 100 mL/hr over 30 Minutes Intravenous  Once 07/01/12 2236 07/01/12 2336   07/01/12 2245  azithromycin (ZITHROMAX) 500 mg in dextrose 5 % 250 mL IVPB  Status:  Discontinued     500 mg 250 mL/hr over 60 Minutes Intravenous  Once 07/01/12 2236 07/02/12 0218      Assessment/Plan: s/p Procedure(s): MINOR REMOVAL OF PERITONEAL DIALYSIS CATHETER Patient to be extubated soon.  Will re-evaluate later today.  LOS: 8 days   Marta Lamas. Gae Bon, MD, FACS [phone] [facility] Surgery 07/09/2012

## 2012-07-09 NOTE — Progress Notes (Signed)
Placed pt. On 6L [place]. Pt. Requested a break from the bipap.

## 2012-07-09 NOTE — Procedures (Signed)
Extubation Procedure Note  Patient Details:   Name: Gary Rivera DOB: 1960-04-24 MRN: [id number]   Airway Documentation:     Evaluation  O2 sats: stable throughout Complications: No apparent complications Patient did tolerate procedure well. Bilateral Breath Sounds: Rhonchi;Diminished Suctioning: Airway Yes  Ave Filter 07/09/2012, 10:19 AM

## 2012-07-09 NOTE — Progress Notes (Signed)
Physical Therapy Treatment Patient Details Name: Gary Rivera MRN: [id number] DOB: 09-Nov-1960 Today's Date: 07/09/2012 Time: 0865-7846 PT Time Calculation (min): 25 min  PT Assessment / Plan / Recommendation Comments on Treatment Session  Pt con't to be limited by SOB and onset of fatigue. Pt had another setback back in the ICU on the vent.  Was extubated earlier today.  Pt was able to perform stand pivot transfer to chair today with desat to mid 80's.  May need CIR stay prior to d/c home.      Follow Up Recommendations  CIR;Supervision/Assistance - 24 hour                 Equipment Recommendations  Rolling walker with 5" wheels    Recommendations for Other Services Rehab consult  Frequency Min 3X/week   Plan Discharge plan needs to be updated;Frequency remains appropriate    Precautions / Restrictions Precautions Precautions: Fall Precaution Comments: 6 LO2 - SpO2 dec 80% with getting off bedpan and transfers, recovers in 2 min to low 90s Required Braces or Orthoses: Other Brace/Splint Other Brace/Splint: pt uses bil knee braces at home. Restrictions Weight Bearing Restrictions: No   Pertinent Vitals/Pain Desat to 80% when gettting off of bedpan, desat to 85% during transfer to chair; No pain    Mobility  Bed Mobility Bed Mobility: Supine to Sit;Sitting - Scoot to Edge of Bed Supine to Sit: HOB elevated;3: Mod assist;With rails Sitting - Scoot to Edge of Bed: With rail;4: Min guard Details for Bed Mobility Assistance: pt requires use of UEs.  Pt on bedpan on arrival.  Pt desat to 80% on 6LO2 while rollling to clean after bedpan use.  Pt coughed up some secretions that PT retrieved with [place].   Transfers Transfers: Sit to Stand;Stand to [employer] Transfers Sit to Stand: 4: Min assist;With upper extremity assist;From bed Stand to Sit: 4: Min assist;With upper extremity assist;To chair/3-in-1;With armrests Stand Pivot Transfers: 4: Min assist Details for  Transfer Assistance: min with rocking technique from lower surface height, minguard A from elevated surface height (pillows placed in chair to elevate surface height).  Pt was able to hold RW and use it to pivot around to chair.  Fair knee stability noted.  Pt having difficulty breathing and keeping sats up.  Desat to 85 % with transfer.   Ambulation/Gait Ambulation/Gait Assistance: Not tested (comment) Stairs: No Wheelchair Mobility Wheelchair Mobility: No    Exercises General Exercises - Lower Extremity Long Arc Quad: AROM;Both;10 reps;Seated Hip Flexion/Marching: AROM;Both;10 reps;Seated    PT Goals Acute Rehab PT Goals PT Goal: Supine/Side to Sit - Progress: Progressing toward goal PT Goal: Sit to Stand - Progress: Progressing toward goal PT Goal: Stand to Sit - Progress: Progressing toward goal PT Goal: Ambulate - Progress: Progressing toward goal  Visit Information  Last PT Received On: 07/09/12 Assistance Needed: +2    Subjective Data  Subjective: Pt received supine in bed agreeable to PT.   Cognition  Cognition Arousal/Alertness: Awake/alert Behavior During Therapy: WFL for tasks assessed/performed Overall Cognitive Status: Within Functional Limits for tasks assessed    Balance  Static Sitting Balance Static Sitting - Balance Support: Bilateral upper extremity supported;Feet supported Static Sitting - Level of Assistance: 5: Stand by assistance Static Sitting - Comment/# of Minutes: 2  End of Session PT - End of Session Equipment Utilized During Treatment: Gait belt;Oxygen Activity Tolerance: Patient limited by fatigue;Other (comment) Patient left: in chair;with call bell/phone within reach Nurse Communication: Mobility status  INGOLD,Jeremie Abdelaziz 07/09/2012, 2:02 PM [facility] Acute Rehabilitation [phone] (pager)

## 2012-07-09 NOTE — Progress Notes (Signed)
Rehab Admissions Coordinator Note:  Patient was screened by Meryl Dare for appropriateness for an Inpatient Acute Rehab Consult.  At this time, we are recommending Inpatient Rehab consult.  Meryl Dare 07/09/2012, 3:31 PM  I can be reached at [phone].

## 2012-07-10 ENCOUNTER — Inpatient Hospital Stay (HOSPITAL_COMMUNITY): Payer: Medicare Other

## 2012-07-10 DIAGNOSIS — J811 Chronic pulmonary edema: Secondary | ICD-10-CM

## 2012-07-10 DIAGNOSIS — R404 Transient alteration of awareness: Secondary | ICD-10-CM

## 2012-07-10 DIAGNOSIS — B3789 Other sites of candidiasis: Secondary | ICD-10-CM

## 2012-07-10 DIAGNOSIS — B379 Candidiasis, unspecified: Secondary | ICD-10-CM

## 2012-07-10 LAB — GLUCOSE, CAPILLARY
Glucose-Capillary: 124 mg/dL — ABNORMAL HIGH (ref 70–99)
Glucose-Capillary: 143 mg/dL — ABNORMAL HIGH (ref 70–99)
Glucose-Capillary: 144 mg/dL — ABNORMAL HIGH (ref 70–99)
Glucose-Capillary: 192 mg/dL — ABNORMAL HIGH (ref 70–99)

## 2012-07-10 LAB — BODY FLUID CULTURE: Special Requests: NORMAL

## 2012-07-10 LAB — CBC
HCT: 24.2 % — ABNORMAL LOW (ref 39.0–52.0)
Hemoglobin: 7.6 g/dL — ABNORMAL LOW (ref 13.0–17.0)
MCH: 28.5 pg (ref 26.0–34.0)
MCV: 90.6 fL (ref 78.0–100.0)
Platelets: 176 10*3/uL (ref 150–400)
RBC: 2.67 MIL/uL — ABNORMAL LOW (ref 4.22–5.81)

## 2012-07-10 LAB — MRSA CULTURE: Special Requests: INVALID

## 2012-07-10 LAB — BASIC METABOLIC PANEL
BUN: 59 mg/dL — ABNORMAL HIGH (ref 6–23)
CO2: 22 mEq/L (ref 19–32)
Calcium: 9.2 mg/dL (ref 8.4–10.5)
Creatinine, Ser: 13.98 mg/dL — ABNORMAL HIGH (ref 0.50–1.35)
Glucose, Bld: 124 mg/dL — ABNORMAL HIGH (ref 70–99)

## 2012-07-10 MED ORDER — FLUCONAZOLE IN SODIUM CHLORIDE 200-0.9 MG/100ML-% IV SOLN
200.0000 mg | INTRAVENOUS | Status: DC
  Filled 2012-07-10: qty 100

## 2012-07-10 MED ORDER — METHYLPREDNISOLONE SODIUM SUCC 125 MG IJ SOLR
125.0000 mg | Freq: Once | INTRAMUSCULAR | Status: AC
  Administered 2012-07-10: 125 mg via INTRAVENOUS
  Filled 2012-07-10 (×2): qty 2

## 2012-07-10 MED ORDER — INSULIN ASPART 100 UNIT/ML ~~LOC~~ SOLN
0.0000 [IU] | Freq: Three times a day (TID) | SUBCUTANEOUS | Status: DC
  Administered 2012-07-10: 2 [IU] via SUBCUTANEOUS
  Administered 2012-07-11 (×2): 1 [IU] via SUBCUTANEOUS

## 2012-07-10 MED ORDER — SODIUM CHLORIDE 0.9 % IV SOLN: 100.0000 mL | INTRAVENOUS | Status: DC | PRN

## 2012-07-10 MED ORDER — LIDOCAINE HCL (PF) 1 % IJ SOLN: 5.0000 mL | INTRAMUSCULAR | Status: DC | PRN

## 2012-07-10 MED ORDER — LIDOCAINE-PRILOCAINE 2.5-2.5 % EX CREA
1.0000 "application " | TOPICAL_CREAM | CUTANEOUS | Status: DC | PRN
  Filled 2012-07-10: qty 5

## 2012-07-10 MED ORDER — FLUCONAZOLE IN SODIUM CHLORIDE 400-0.9 MG/200ML-% IV SOLN
400.0000 mg | Freq: Once | INTRAVENOUS | Status: AC
  Administered 2012-07-10: 400 mg via INTRAVENOUS
  Filled 2012-07-10: qty 200

## 2012-07-10 MED ORDER — HEPARIN SODIUM (PORCINE) 1000 UNIT/ML DIALYSIS
1000.0000 [IU] | INTRAMUSCULAR | Status: DC | PRN
  Administered 2012-07-10: 1000 [IU] via INTRAVENOUS_CENTRAL
  Filled 2012-07-10: qty 1

## 2012-07-10 MED ORDER — PENTAFLUOROPROP-TETRAFLUOROETH EX AERO: 1.0000 "application " | INHALATION_SPRAY | CUTANEOUS | Status: DC | PRN

## 2012-07-10 MED ORDER — OXYCODONE-ACETAMINOPHEN 5-325 MG PO TABS
1.0000 | ORAL_TABLET | ORAL | Status: DC | PRN
  Administered 2012-07-10 – 2012-07-14 (×9): 1 via ORAL
  Administered 2012-07-14 (×2): 2 via ORAL
  Administered 2012-07-15: 1 via ORAL
  Administered 2012-07-16 (×2): 2 via ORAL
  Filled 2012-07-10: qty 2
  Filled 2012-07-10 (×2): qty 1
  Filled 2012-07-10 (×2): qty 2
  Filled 2012-07-10 (×5): qty 1
  Filled 2012-07-10: qty 2
  Filled 2012-07-10: qty 1
  Filled 2012-07-10 (×2): qty 2

## 2012-07-10 MED ORDER — BOOST / RESOURCE BREEZE PO LIQD
1.0000 | Freq: Three times a day (TID) | ORAL | Status: DC
  Administered 2012-07-10 – 2012-07-16 (×6): 1 via ORAL

## 2012-07-10 MED ORDER — ALTEPLASE 2 MG IJ SOLR
2.0000 mg | Freq: Once | INTRAMUSCULAR | Status: AC | PRN
  Filled 2012-07-10: qty 2

## 2012-07-10 MED ORDER — HEPARIN SODIUM (PORCINE) 1000 UNIT/ML DIALYSIS
2000.0000 [IU] | INTRAMUSCULAR | Status: DC | PRN
  Filled 2012-07-10: qty 2

## 2012-07-10 MED ORDER — ALBUTEROL SULFATE (5 MG/ML) 0.5% IN NEBU: 2.5000 mg | INHALATION_SOLUTION | RESPIRATORY_TRACT | Status: DC | PRN

## 2012-07-10 MED ORDER — NEPRO/CARBSTEADY PO LIQD: 237.0000 mL | ORAL | Status: DC | PRN

## 2012-07-10 NOTE — Progress Notes (Signed)
PULMONARY  / CRITICAL CARE MEDICINE  Name: JEMMIE RHINEHART MRN: [id number] DOB: [birth year]    ADMISSION DATE:  07/01/2012 CONSULTATION DATE:  07/01/12  REFERRING MD :  Dr. Juleen China PRIMARY SERVICE: PCCM  CHIEF COMPLAINT:  Acute respiratory failure post op  BRIEF PATIENT DESCRIPTION: Mr. Arora 52 yo AA man pmh of pulmonary HTN, systolic HF, ESRD on peritoneal dialysis, and OHS presented with abdominal pain after 8mg  of morphine developed acute respiratory failure with little improvement from narcan and refused bipap. pt was intubated and PCCM called to manage. Extubated. Comes back to 2100 post op resp failure after fungal peritonitis and catheter removal in OR.  SIGNIFICANT EVENTS / STUDIES:  4/13 Adm with dx sepsis, resp source suspected 4/13 CT abd: slight bibasilar infiltrates, no dilated bowels or acute process 4/13 CXR: bibasilar infiltrates, L effusion, under inflated lungs 4/14 Vasopressors and CRRT initiated 4/15 Extubated. 4/20- peritoneal catheter removal in OR, intubated 4/21- 1st HD 4/20, tolerated well, 4L removed.  LINES / TUBES: ETT 4/13 >>4/15 L IJ CVL 4/14 >> R IJ HD cath >>  ETT 4/21>>4/21  CULTURES: 4/14 peritoneal fluid Cx >> yeast>>> 4/13 sputum Cx >neg 4/14 BCx>>>neg  ANTIBIOTICS: 4/13 rocephin >>>4/13 4/13 azithromax >>>4/13 4/13 IV Vanc >>>4/20 4/13 IV Zosyn >>>4/20 mycofungin 4/18>>>  SUBJECTIVE:  Back on Bipap overnight due to hypoxic distress.  VITAL SIGNS: Temp:  [97.5 F (36.4 C)-100 F (37.8 C)] 97.5 F (36.4 C) (04/22 0358) Pulse Rate:  [83-101] 85 (04/22 0600) Resp:  [15-37] 25 (04/22 0600) BP: (101-183)/(48-90) 125/72 mmHg (04/22 0600) SpO2:  [80 %-100 %] 95 % (04/22 0600) FiO2 (%):  [39.5 %-50.1 %] 39.9 % (04/22 0600) Weight:  [312 lb 2.7 oz (141.6 kg)] 312 lb 2.7 oz (141.6 kg) (04/22 0600) HEMODYNAMICS:   VENTILATOR SETTINGS: Vent Mode:  [-] BIPAP FiO2 (%):  [39.5 %-50.1 %] 39.9 % Set Rate:  [12 bmp] 12 bmp PEEP:  [5 cmH20-6  cmH20] 6 cmH20 Pressure Support:  [5 cmH20] 5 cmH20 INTAKE / OUTPUT: Intake/Output     04/21 0701 - 04/22 0700 04/22 0701 - 04/23 0700   I.V. (mL/kg) 25.9 (0.2)    NG/GT     IV Piggyback 100    Total Intake(mL/kg) 125.9 (0.9)    Emesis/NG output     Other     Blood     Total Output       Net +125.9          Stool Occurrence 3 x     PHYSICAL EXAMINATION: General:  Awake and NAD Neuro:  Alert and oriented.  HEENT: , EOMI. On BiPaP Cardiovascular: S1 S2  RRR, no murmurs/rubs/gallops Lungs: diffuse coarse breathing b/l, no wheezes. Abdomen:  Distended, peritoneal site dressed, non-erythematous/non-indurated, no catheter present now Musculoskeletal:  intact Skin: dry    LABS:  Recent Labs Lab 07/04/12 0445 07/04/12 2300  07/05/12 0420 07/05/12 0542  07/07/12 0500 07/08/12 0758 07/08/12 1227 07/08/12 1228 07/08/12 1613 07/09/12 0400 07/09/12 1100 07/10/12 0333  HGB 8.5* 8.5*  --  8.0*  --   < >  --  8.5*  --   --   --  7.6*  --  7.6*  WBC 9.2 12.0*  --  17.0*  --   < >  --  11.3*  --   --   --  9.0  --  10.6*  PLT 171 218  --  172  --   < >  --  163  --   --   --  151  --  176  NA 139 137  --  137  --   < >  --   --  140 139  --  141 143 141  K 4.4 4.1  --  4.5  --   < >  --   --  4.2 4.2  --  3.7 4.0 4.8  CL 101 99  --  99  --   < >  --   --  99 99  --  100 102 100  CO2 27 25  --  25  --   < >  --   --  26 26  --  24 25 22   GLUCOSE 101* 110*  --  121*  --   < >  --   --  94 92  --  78 91 124*  BUN 29* 34*  --  38*  --   < >  --   --  38* 38*  --  48* 51* 59*  CREATININE 8.36* 10.20*  --  11.03*  --   < >  --   --  10.17* 9.87*  --  11.39* 12.44* 13.98*  CALCIUM 8.5 8.5  --  8.7  --   < >  --   --  9.1 8.9  --  9.2 9.3 9.2  MG 2.5  --   --  2.3  --   --   --   --   --   --   --   --   --   --   PHOS 5.7*  --   --  5.5*  --   --   --   --  6.9*  --   --  5.1* 7.2*  --   ALBUMIN 2.3*  --   --   --   --   --   --   --  2.3*  --   --  2.4* 2.4*  --   LATICACIDVEN  --   0.8  --   --   --   --   --   --   --   --   --   --   --   --   TROPONINI  --  <0.30  --   --   --   --   --   --   --  <0.30  --   --   --   --   PROCALCITON  --   --   --   --   --   --  34.94  --   --   --   --   --   --   --   PHART  --   --   < >  --  7.238*  --   --   --   --  7.208* 7.303*  --   --   --   PCO2ART  --   --   < >  --  59.4*  --   --   --   --  69.3* 51.3*  --   --   --   PO2ART  --   --   < >  --  122.0*  --   --   --   --  108.0* 63.0*  --   --   --   < > = values in this interval not displayed.  Recent Labs Lab 07/08/12 2356 07/09/12 0112 07/09/12 0355 07/09/12 0813  07/10/12 0344  GLUCAP 70 93 74 75 117*   4/22 CXR: mild subglottic edema, mild pulm edema  ASSESSMENT / PLAN: Principal Problem:   Acute respiratory failure with hypercapnia Active Problems:   Systolic congestive heart failure with reduced left ventricular function, NYHA class 1- EF 40-45% 2011 ECHO   CKD (chronic kidney disease) requiring chronic PERITONEAL dialysis   HTN (hypertension)   Hypercapnia   Pulmonary edema   Obesity hypoventilation syndrome   Septic shock(785.52)   Peritoneal dialysis catheter exit site infection   PULMONARY A: Acute Resp failure (pre procedure) R/o ATX / pulm edema / ALI Overt subglottic edema seen on CXR AM 4/22 P:   - tol exubation 4/21 well for few hours and then developed post-extubation stridor and hypoxia. Now better this AM 4/21. -cont IV medrol for UAO  CARDIOVASCULAR A: systolic heart failure EF 40% last Echo 6/13 R/o ischemia contribution. Trop neg and unchanged EKG. Hypotensive with HD. P:  - no active intervention needed now.  RENAL A:  ESRD, chronic peritoneal dialysis- removed PD cath 4/20. Started HD 4/20. 4L removed. P:   - HD per renal this am.   GASTROINTESTINAL A: High risk stress P:   - advance to renal diet , passed swallow easily 4/22 ppi for SUP  HEMATOLOGIC A:risk dvt, Hb- 7.6- at baseline. P:  -Monitor CBC  intermittently - Scd  INFECTIOUS A:  Yeast peritonitis Severe sepsis- resolved. P:   - transition to flucanozole IV per ID. Follow new OR cultures  ENDOCRINE A:  At risk hypoglycemia P:   - Cont to monitor. - cont SSI for glu > 180, ssi  NEUROLOGIC A:  Agitated, high risk delirium P:   - hold sedation now that is extubated  PATEL,RAVI, MD 7:53 AM   TODAY'S SUMMARY: 52 yo AAM with peritoneal dialysis and PD catheter fungal peritonitis s/p PD catheter removal. Pt better 4/22 and off NIMV. We will titrate oxygen, give steroids for UAO, use NIMV PRN.  Ccm time 35 min    Caryl Bis  [phone]  Cell  [phone]  If no response or cell goes to voicemail, call beeper [phone]

## 2012-07-10 NOTE — Progress Notes (Signed)
Pt  Requested and demanded to come off bipap for a brief period to use the bed pan. Pt. Was placed on non rebreather for a brief period while he used the bed pan, and then RT placed pt. Back on bipap. Pt. Has had a few times where his sats would drop into the 70's when taken of bipap. Pt. Currently maintaining sats and tolerating bipap.

## 2012-07-10 NOTE — Progress Notes (Signed)
Chaplain Note:  Chaplain visited with pt and pt's dialysis nurse.  Pt was resting in bed, receiving HD.  Pt's dialysis nurse was at bedside conducting the HD treatment.  Chaplain provided spiritual comfort, support, and prayer for pt.  Pt expressed appreciation for chaplain support.  Chaplain will follow up as needed.  07/10/12 1200  Clinical Encounter Type  Visited With Patient;Health care provider  Visit Type Spiritual support  Referral From Other (Comment) (Sheniqua Carolan referral)  Spiritual Encounters  Spiritual Needs Prayer;Emotional  Stress Factors  Patient Stress Factors Health changes  Family Stress Factors None identified (No family present at this time)  Verdie Shire, Chaplain [phone]

## 2012-07-10 NOTE — Progress Notes (Signed)
[facility] ROUNDING NOTE   Subjective:   Interval History: desat's overnight, was on bipap for a period, now on FM O2 at 100%. CXR today no overt edema  Objective:  Vital signs in last 24 hours:  Temp:  [97.5 F (36.4 C)-100 F (37.8 C)] 98.3 F (36.8 C) (04/22 0811) Pulse Rate:  [82-101] 86 (04/22 1045) Resp:  [19-37] 23 (04/22 1045) BP: (101-183)/(48-90) 141/80 mmHg (04/22 1045) SpO2:  [80 %-100 %] 100 % (04/22 1045) FiO2 (%):  [39.5 %-100 %] 100 % (04/22 1000) Weight:  [141.6 kg (312 lb 2.7 oz)-141.7 kg (312 lb 6.3 oz)] 141.7 kg (312 lb 6.3 oz) (04/22 1023)  Weight change: -4.1 kg (-9 lb 0.6 oz) Filed Weights   07/08/12 1928 07/10/12 0600 07/10/12 1023  Weight: 141.6 kg (312 lb 2.7 oz) 141.6 kg (312 lb 2.7 oz) 141.7 kg (312 lb 6.3 oz)    Intake/Output: I/O last 3 completed shifts: In: 383.4 [I.V.:283.4; IV Piggyback:100] Out: 4000 [Other:4000]   Intake/Output this shift:    Gen: on FM O2, alert, responsive, not in distress, tachypneic CVS- RRR systolic murmur R IJ temp cath 3-lumen RS- bibasilar rales ABD- distended, PD cath is out, nontender EXT- no LE edema   Basic Metabolic Panel:  Recent Labs Lab 07/04/12 0445  07/05/12 0420  07/08/12 1227 07/08/12 1228 07/09/12 0400 07/09/12 1100 07/10/12 0333  NA 139  < > 137  < > 140 139 141 143 141  K 4.4  < > 4.5  < > 4.2 4.2 3.7 4.0 4.8  CL 101  < > 99  < > 99 99 100 102 100  CO2 27  < > 25  < > 26 26 24 25 22   GLUCOSE 101*  < > 121*  < > 94 92 78 91 124*  BUN 29*  < > 38*  < > 38* 38* 48* 51* 59*  CREATININE 8.36*  < > 11.03*  < > 10.17* 9.87* 11.39* 12.44* 13.98*  CALCIUM 8.5  < > 8.7  < > 9.1 8.9 9.2 9.3 9.2  MG 2.5  --  2.3  --   --   --   --   --   --   PHOS 5.7*  --  5.5*  --  6.9*  --  5.1* 7.2*  --   < > = values in this interval not displayed.  Liver Function Tests:  Recent Labs Lab 07/04/12 0445 07/08/12 1227 07/09/12 0400 07/09/12 1100  ALBUMIN 2.3* 2.3* 2.4* 2.4*   No  results found for this basename: LIPASE, AMYLASE,  in the last 168 hours No results found for this basename: AMMONIA,  in the last 168 hours  CBC:  Recent Labs Lab 07/05/12 0420 07/06/12 0440 07/08/12 0758 07/09/12 0400 07/10/12 0333  WBC 17.0* 11.7* 11.3* 9.0 10.6*  HGB 8.0* 7.2* 8.5* 7.6* 7.6*  HCT 25.2* 23.2* 27.4* 23.3* 24.2*  MCV 95.8 96.3 92.3 89.6 90.6  PLT 172 150 163 151 176    Cardiac Enzymes:  Recent Labs Lab 07/04/12 2300 07/08/12 1228  TROPONINI <0.30 <0.30    BNP: No components found with this basename: POCBNP,   CBG:  Recent Labs Lab 07/09/12 0112 07/09/12 0355 07/09/12 0813 07/10/12 0344 07/10/12 0754  GLUCAP 93 74 75 117* 126*    Microbiology: Results for orders placed during the hospital encounter of 07/01/12  MRSA PCR SCREENING     Status: None   Collection Time    07/02/12 12:17 AM  Result Value Range Status   MRSA by PCR NEGATIVE  NEGATIVE Final   Comment:            The GeneXpert MRSA Assay (FDA     approved for NASAL specimens     only), is one component of a     comprehensive MRSA colonization     surveillance program. It is not     intended to diagnose MRSA     infection nor to guide or     monitor treatment for     MRSA infections.  CULTURE, BLOOD (ROUTINE X 2)     Status: None   Collection Time    07/02/12  3:30 AM      Result Value Range Status   Specimen Description BLOOD LEFT HAND   Final   Special Requests BOTTLES DRAWN AEROBIC ONLY [facility]   Final   Culture  Setup Time 07/02/2012 07:34   Final   Culture NO GROWTH 5 DAYS   Final   Report Status 07/08/2012 FINAL   Final  CULTURE, BLOOD (ROUTINE X 2)     Status: None   Collection Time    07/02/12  3:45 AM      Result Value Range Status   Specimen Description BLOOD LEFT HAND   Final   Special Requests BOTTLES DRAWN AEROBIC ONLY [facility]   Final   Culture  Setup Time 07/02/2012 07:34   Final   Culture NO GROWTH 5 DAYS   Final   Report Status 07/08/2012 FINAL   Final   BODY FLUID CULTURE     Status: None   Collection Time    07/02/12  5:57 AM      Result Value Range Status   Specimen Description FLUID PERITONEAL   Final   Special Requests Normal   Final   Gram Stain     Final   Value: ABUNDANT WBC PRESENT,BOTH PMN AND MONONUCLEAR     NO ORGANISMS SEEN   Culture     Final   Value: RARE CANDIDA PARAPSILOSIS     Note: CRITICAL RESULT CALLED TO, READ BACK BY AND VERIFIED WITH: NURSE AMARUGE@1431  ON [id number] BY [facility]   Report Status 07/10/2012 FINAL   Final  FUNGUS CULTURE W SMEAR     Status: None   Collection Time    07/08/12  8:30 AM      Result Value Range Status   Specimen Description CATH TIP   Final   Special Requests POF ZOSYN   Final   Fungal Smear FEW YEAST   Final   Culture CULTURE IN PROGRESS FOR FOUR WEEKS   Final   Report Status PENDING   Incomplete  CATH TIP CULTURE     Status: None   Collection Time    07/08/12  8:30 AM      Result Value Range Status   Specimen Description CATH TIP   Final   Special Requests POF ZOSYN   Final   Culture NO GROWTH   Final   Report Status PENDING   Incomplete  MRSA PCR SCREENING     Status: Abnormal   Collection Time    07/08/12 11:01 AM      Result Value Range Status   MRSA by PCR INVALID RESULTS, SPECIMEN SENT FOR CULTURE (*) NEGATIVE Final   Comment:            The GeneXpert MRSA Assay (FDA     approved for NASAL specimens     only), is one  component of a     comprehensive MRSA colonization     surveillance program. It is not     intended to diagnose MRSA     infection nor to guide or     monitor treatment for     MRSA infections.  MRSA CULTURE     Status: None   Collection Time    07/08/12 11:01 AM      Result Value Range Status   Specimen Description NASAL SWAB   Final   Special Requests NONE MRSA PCR WAS INVALID TWICE   Final   Culture     Final   Value: NO STAPHYLOCOCCUS AUREUS ISOLATED     Note: NOMRSA   Report Status 07/10/2012 FINAL   Final      Medications:     .  albuterol  2.5 mg Nebulization Q4H  . alteplase  2 mg Intracatheter Once  . antiseptic oral rinse  15 mL Mouth Rinse BID  . aspirin  325 mg Oral Daily  . bisoprolol  2.5 mg Oral Daily  . calcitRIOL  0.25 mcg Oral Custom  . darbepoetin (ARANESP) injection - DIALYSIS  100 mcg Intravenous Q Fri-HD  . feeding supplement  1 Container Oral TID BM  . lanthanum  1,000 mg Oral TID WC  . micafungin (MYCAMINE) IV  100 mg Intravenous Daily  . pantoprazole  40 mg Oral QHS  . sodium chloride  3 mL Intravenous Q12H   sodium chloride, sodium chloride, sodium chloride, sodium chloride, acetaminophen (TYLENOL) oral liquid 160 mg/5 mL, alteplase, clonazePAM, feeding supplement (NEPRO CARB STEADY), fentaNYL, heparin, [START ON 07/11/2012] heparin, lidocaine (PF), lidocaine-prilocaine, oxyCODONE-acetaminophen, pentafluoroprop-tetrafluoroeth, simethicone, sodium chloride  Assessment/ Plan:   1. Septic shock / fungal peritonitis, s/p PD cath removal 4/20-  micafungin per ID, better 2. Acute resp failure / pulm edema-  Still prob vol overloaded, HD again today for volume removal 3. ESRD, transitioned to HD from PD. HD today with temp cath. Will need tunneled HD cath when more stable 4. 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 HTN/volume- not on BP meds at home. Vol excess, HD today   [name]  MD [phone] pgr    [phone] cell 07/10/2012, 11:11 AM

## 2012-07-10 NOTE — Consult Note (Signed)
Physical Medicine and Rehabilitation Consult  Reason for Consult: Deconditioning Referring Physician: Dr. Tyson Alias   HPI: Gary Rivera is a 52 y.o. male with history of ESRD on peritoneal dialysis and recent treatment for cellulitis, pulmonary HTN, OHS--oxygen dependent; who presented to ED on 07/01/12 with worsening abdominal pain. He developed acute respiratory failure after 8 mg of morphine with little improvement from narcan and required intubation. He was treated for septic shock with IV antibiotics and fluid culture done+ for candida parapsilosis. Dr. Daiva Eves consulted and recommended removal of PD cath and starting patient on antifungal. Cath removed by Dr. Donell Beers on 04/20 and ID recommends 4 weeks total of flucanozole past PD removal. Patient extubation 04/21but few hours later developed hypoxia and stridor  requiring BIPAP/ face mask. Was dialyzed yesterday for treatment of fluid overload with improvement in respiratory status. PT evaluation done and patient noted to be deconditioned. PT, MD recommending CIR.    Review of Systems  Eyes: Negative for blurred vision and double vision.  Respiratory: Negative for cough and shortness of breath.   Cardiovascular: Negative for chest pain and palpitations.  Gastrointestinal: Negative for nausea and abdominal pain.  Musculoskeletal: Negative for myalgias, back pain and joint pain.  Neurological: Negative for sensory change and speech change.  Psychiatric/Behavioral: The patient does not have insomnia.    Past Medical History  Diagnosis Date  . Hypertension   . Renal disorder   . CHF (congestive heart failure)   . Peritoneal dialysis catheter in place   . Anemia     patient reporats that last hgb 7.9 a week ago  . Anxiety state, unspecified    Past Surgical History  Procedure Laterality Date  . Appendectomy    . Peritoneal catheter insertion     Family History  Problem Relation Age of Onset  . Diabetes Mother   . Hypertension  Mother   . Hypertension Sister   . Asthma Sister    Social History:  ives alone in [place]. Independent PTA--disabled due to PD for past 18 months. He reports that he has never smoked. He has never used smokeless tobacco. He reports that he does not drink alcohol or use illicit drugs.   Allergies  Allergen Reactions  . Renvela (Sevelamer) Nausea And Vomiting   Medications Prior to Admission  Medication Sig Dispense Refill  . albuterol (PROVENTIL HFA;VENTOLIN HFA) 108 (90 BASE) MCG/ACT inhaler Inhale 2 puffs into the lungs every 6 (six) hours as needed for wheezing.  1 Inhaler  2  . aspirin EC 81 MG tablet Take 81 mg by mouth daily.      Marland Kitchen b complex-vitamin c-folic acid (NEPHRO-VITE) 0.8 MG TABS Take 0.8 mg by mouth daily.      . bisoprolol (ZEBETA) 5 MG tablet Take 2.5 mg by mouth daily.      . calcitRIOL (ROCALTROL) 0.25 MCG capsule Take 0.25 mcg by mouth See admin instructions. Takes 1 capsule daily Monday through friday      . clonazePAM (KLONOPIN) 0.5 MG tablet Take 0.5 mg by mouth 2 (two) times daily as needed. anxiety      . famotidine (PEPCID) 20 MG tablet Take 20 mg by mouth at bedtime as needed for heartburn.      . lanthanum (FOSRENOL) 1000 MG chewable tablet Chew 1,000 mg by mouth 3 (three) times daily with meals.      . Multiple Vitamin (MULTIVITAMIN) tablet Take 1 tablet by mouth daily.      Marland Kitchen omeprazole (PRILOSEC) 20  MG capsule Take 20 mg by mouth daily.      . vitamin B-12 (CYANOCOBALAMIN) 100 MCG tablet Take 50 mcg by mouth daily.        Home: Home Living Lives With: Alone Available Help at Discharge: Personal care attendant (CNA, daily 2.5 hours, 7 days/wk) Type of Home: House Home Access: Stairs to enter [employer] of Steps: 4 Entrance Stairs-Rails: Right;Left;Can reach both Home Layout: One level Bathroom Shower/Tub: Forensic scientist: Standard Bathroom Accessibility: Yes How Accessible: Accessible via walker Home Adaptive  Equipment: Walker - rolling (home O2, leg braces) Additional Comments: has  bil knee braces hinged- wears them just when he knows he will really exert himself.    Functional History: Prior Function Able to Take Stairs?: Yes Driving: Yes Functional Status:  Mobility: Bed Mobility Bed Mobility: Supine to Sit;Sitting - Scoot to Edge of Bed Supine to Sit: HOB elevated;3: Mod assist;With rails Sitting - Scoot to [place] of Bed: With rail;4: Min guard Transfers Transfers: Sit to Stand;Stand to [employer] Transfers Sit to Stand: 4: Min assist;With upper extremity assist;From bed Stand to Sit: 4: Min assist;With upper extremity assist;To chair/3-in-1;With armrests Stand Pivot Transfers: 4: Min assist Ambulation/Gait Ambulation/Gait Assistance: Not tested (comment) Ambulation Distance (Feet): 100 Feet (with 1 seated rest break x 5 min) Assistive device: Rolling walker Ambulation/Gait Assistance Details: +DOE, SpO2 dec to 87% recovered s/p 2 min of seated rest to >95%. pt reports being most limited by fatigue. no knee buckling this date Gait Pattern: Step-through pattern;Trunk flexed Gait velocity: slow Stairs: No Wheelchair Mobility Wheelchair Mobility: No  ADL:    Cognition: Cognition Overall Cognitive Status: Within Functional Limits for tasks assessed Arousal/Alertness: Awake/alert Orientation Level: Oriented X4 Cognition Arousal/Alertness: Awake/alert Behavior During Therapy: WFL for tasks assessed/performed Overall Cognitive Status: Within Functional Limits for tasks assessed  Blood pressure 109/79, pulse 76, temperature 98.1 F (36.7 C), temperature source Oral, resp. rate 24, height 6' (1.829 m), weight 137.4 kg (302 lb 14.6 oz), SpO2 99.00%. Physical Exam  Nursing note and vitals reviewed. Constitutional: He is oriented to person, place, and time. He appears well-developed and well-nourished.  Obese male.  HENT:  Head: Normocephalic and atraumatic.  Eyes: Pupils are  equal, round, and reactive to light.  Neck: Normal range of motion.  Cardiovascular: Normal rate and regular rhythm.   Pulmonary/Chest: Effort normal and breath sounds normal. No respiratory distress. He has no wheezes.  On  5L O2 per [place].   Abdominal: Soft. Bowel sounds are normal. He exhibits no distension. There is no tenderness.  Musculoskeletal: He exhibits no edema and no tenderness.  Neurological: He is alert and oriented to person, place, and time.  Mild dysphonia. Moves all 4's. 4-5/5 strength. No sensory findings   Skin: Skin is warm and dry.  Psychiatric: He has a normal mood and affect. His behavior is normal. Judgment and thought content normal.    Results for orders placed during the hospital encounter of 07/01/12 (from the past 24 hour(s))  GLUCOSE, CAPILLARY     Status: Abnormal   Collection Time    07/10/12 11:47 AM      Result Value Range   Glucose-Capillary 124 (*) 70 - 99 mg/dL  GLUCOSE, CAPILLARY     Status: Abnormal   Collection Time    07/10/12  4:42 PM      Result Value Range   Glucose-Capillary 192 (*) 70 - 99 mg/dL  GLUCOSE, CAPILLARY     Status: Abnormal   Collection  Time    07/10/12 10:09 PM      Result Value Range   Glucose-Capillary 144 (*) 70 - 99 mg/dL   Comment 1 Notify RN    GLUCOSE, CAPILLARY     Status: Abnormal   Collection Time    07/10/12 11:38 PM      Result Value Range   Glucose-Capillary 143 (*) 70 - 99 mg/dL   Comment 1 Notify RN    RENAL FUNCTION PANEL     Status: Abnormal   Collection Time    07/11/12  3:50 AM      Result Value Range   Sodium 140  135 - 145 mEq/L   Potassium 4.4  3.5 - 5.1 mEq/L   Chloride 99  96 - 112 mEq/L   CO2 28  19 - 32 mEq/L   Glucose, Bld 138 (*) 70 - 99 mg/dL   BUN 46 (*) 6 - 23 mg/dL   Creatinine, Ser 9.62 (*) 0.50 - 1.35 mg/dL   Calcium 9.3  8.4 - 95.2 mg/dL   Phosphorus 6.9 (*) 2.3 - 4.6 mg/dL   Albumin 2.6 (*) 3.5 - 5.2 g/dL   GFR calc non Af Amer 6 (*) >90 mL/min   GFR calc Af Amer 6 (*) >90  mL/min  GLUCOSE, CAPILLARY     Status: Abnormal   Collection Time    07/11/12  3:54 AM      Result Value Range   Glucose-Capillary 133 (*) 70 - 99 mg/dL   Comment 1 Notify RN    GLUCOSE, CAPILLARY     Status: Abnormal   Collection Time    07/11/12  7:19 AM      Result Value Range   Glucose-Capillary 135 (*) 70 - 99 mg/dL   Dg Chest Port 1 View  07/10/2012  *RADIOLOGY REPORT*  Clinical Data: Post extubation, stridor  PORTABLE CHEST - 1 VIEW  Comparison: Portable exam 0801 hours compared to 07/09/2012  Findings: Right jugular central venous catheter stable tip projecting over SVC. Nasogastric and endotracheal tubes no longer identified. Enlargement of cardiac silhouette. Persistent elevation of right diaphragm with right basilar atelectasis. Question minimal perihilar infiltrate versus edema. No gross segmental consolidation, pleural effusion or pneumothorax.  IMPRESSION: Mild right basilar atelectasis. Enlargement of cardiac silhouette with question residual perihilar edema or infiltrate.   Original Report Authenticated By: Ulyses Southward, M.D.     Assessment/Plan: Diagnosis: deconditioning after sepsis 1. Does the need for close, 24 hr/day medical supervision in concert with the patient's rehab needs make it unreasonable for this patient to be served in a less intensive setting? No 2. Co-Morbidities requiring supervision/potential complications: see above 3. Due to bladder management, bowel management, safety, skin/wound care, disease management, medication administration, pain management and patient education, does the patient require 24 hr/day rehab nursing? No 4. Does the patient require coordinated care of a physician, rehab nurse, PT, OT to address physical and functional deficits in the context of the above medical diagnosis(es)? No Addressing deficits in the following areas: balance, endurance, locomotion, strength and transferring 5. Can the patient actively participate in an intensive  therapy program of at least 3 hrs of therapy per day at least 5 days per week? No 6. The potential for patient to make measurable gains while on inpatient rehab is fair 7. Anticipated functional outcomes upon discharge from inpatient rehab are n/a with PT, n/a with OT, n/a with SLP. 8. Estimated rehab length of stay to reach the above functional goals is: n/a 9.  Does the patient have adequate social supports to accommodate these discharge functional goals? Potentially 10. Anticipated D/C setting: Home 11. Anticipated post D/C treatments: HH therapy 12. Overall Rehab/Functional Prognosis: excellent  RECOMMENDATIONS: This patient's condition is appropriate for continued rehabilitative care in the following setting: HHPT once medically appropriate for dc. Patient has agreed to participate in recommended program. Yes Note that insurance prior authorization may be required for reimbursement for recommended care.  Comment:    07/11/2012

## 2012-07-10 NOTE — Progress Notes (Signed)
[facility]Gary Rivera Progress Note 2 Days Post-Op  Subjective: Patient is extubated and communicating well.  On NRB mask.  To be going for dialysis soon this morning.  Objective: Vital signs in last 24 hours: Temp:  [97.5 F (36.4 C)-100 F (37.8 C)] 98.3 F (36.8 C) (04/22 0811) Pulse Rate:  [83-101] 85 (04/22 0600) Resp:  [19-37] 25 (04/22 0600) BP: (101-183)/(48-90) 125/72 mmHg (04/22 0600) SpO2:  [80 %-100 %] 95 % (04/22 0600) FiO2 (%):  [39.5 %-50.1 %] 39.9 % (04/22 0600) Weight:  [141.6 kg (312 lb 2.7 oz)] 141.6 kg (312 lb 2.7 oz) (04/22 0600) Last BM Date: 07/09/12  Intake/Output from previous day: 04/21 0701 - 04/22 0700 In: 125.9 [I.V.:25.9; IV Piggyback:100] Out: -  Intake/Output this shift:    General: No acute distress.  Oxygen saturations are 100%  Lungs: Clear  Abd: Non-tender.  LUQ wound needs to be cleaned with peroxide and Q-tip to removed the clot.  Then covered with Mepilex like dressing.  Extremities: No changes  Neuro: Intact  Lab Results:  @LABLAST2 (wbc:2,hgb:2,hct:2,plt:2) BMET  Recent Labs  07/09/12 1100 07/10/12 0333  NA 143 141  K 4.0 4.8  CL 102 100  CO2 25 22  GLUCOSE 91 124*  BUN 51* 59*  CREATININE 12.44* 13.98*  CALCIUM 9.3 9.2   PT/INR No results found for this basename: LABPROT, INR,  in the last 72 hours ABG  Recent Labs  07/08/12 1228 07/08/12 1613  PHART 7.208* 7.303*  HCO3 27.6* 25.2*    Studies/Results: Dg Chest Port 1 View  07/09/2012  *RADIOLOGY REPORT*  Clinical Data: Respiratory failure and pulmonary edema.  PORTABLE CHEST - 1 VIEW  Comparison: 07/08/2012  Findings: Endotracheal tube tip projects approximately 1.5 cm above the carina.  Nasogastric tube extends below the diaphragm.  Large bore right jugular central venous catheter tip remains in the SVC. Lungs show improved volumes bilaterally with residual suggestion of mild edema and bibasilar atelectasis present.  No large pleural effusions are seen.  IMPRESSION:  Improved aeration bilaterally with suspected residual mild edema.   Original Report Authenticated By: Irish Lack, M.D.    Dg Chest Port 1 View  07/08/2012  *RADIOLOGY REPORT*  Clinical Data: Check ET tube.  PORTABLE CHEST - 1 VIEW  Comparison: 07/06/2012  Findings: Endotracheal tube is 3 cm above the carina.  Interval removal of the left central line.  Right central line and NG tube are stable.  There is cardiomegaly with vascular congestion.  Worsening bilateral airspace disease, likely edema.  Low lung volumes with bibasilar atelectasis.  IMPRESSION: Worsening pulmonary edema pattern/CHF.   Original Report Authenticated By: Charlett Nose, M.D.     Anti-infectives: Anti-infectives   Start     Dose/Rate Route Frequency Ordered Stop   07/07/12 2200  vancomycin (VANCOCIN) 1,250 mg in sodium chloride 0.9 % 250 mL IVPB  Status:  Discontinued     1,250 mg 250 mL/hr over 60 Minutes Intravenous  Once 07/07/12 2158 07/07/12 2206   07/06/12 1545  micafungin (MYCAMINE) 100 mg in sodium chloride 0.9 % 100 mL IVPB     100 mg 100 mL/hr over 1 Hours Intravenous Daily 07/06/12 1541     07/05/12 1800  vancomycin (VANCOCIN) IVPB 750 mg/150 ml premix  Status:  Discontinued     750 mg 150 mL/hr over 60 Minutes Intravenous  Once 07/05/12 1352 07/05/12 1355   07/05/12 1600  vancomycin (VANCOCIN) IVPB 750 mg/150 ml premix     750 mg 150 mL/hr over 60 Minutes  Intravenous  Once 07/05/12 1355 07/05/12 1956   07/04/12 2100  piperacillin-tazobactam (ZOSYN) IVPB 2.25 g  Status:  Discontinued     2.25 g 100 mL/hr over 30 Minutes Intravenous 3 times per day 07/04/12 1120 07/08/12 1103   07/02/12 2200  piperacillin-tazobactam (ZOSYN) IVPB 3.375 g  Status:  Discontinued     3.375 g 12.5 mL/hr over 240 Minutes Intravenous Every 6 hours 07/02/12 1703 07/04/12 1120   07/02/12 2000  vancomycin (VANCOCIN) 1,500 mg in sodium chloride 0.9 % 500 mL IVPB  Status:  Discontinued     1,500 mg 250 mL/hr over 120 Minutes  Intravenous Every 24 hours 07/02/12 1708 07/03/12 1245   07/02/12 0800  piperacillin-tazobactam (ZOSYN) IVPB 2.25 g  Status:  Discontinued     2.25 g 100 mL/hr over 30 Minutes Intravenous Every 8 hours 07/02/12 0054 07/02/12 1703   07/02/12 0100  vancomycin (VANCOCIN) 2,500 mg in sodium chloride 0.9 % 500 mL IVPB     2,500 mg 250 mL/hr over 120 Minutes Intravenous Every 12 hours 07/02/12 0054 07/02/12 0409   07/02/12 0100  piperacillin-tazobactam (ZOSYN) IVPB 2.25 g     2.25 g 100 mL/hr over 30 Minutes Intravenous  Once 07/02/12 0054 07/02/12 0519   07/01/12 2245  cefTRIAXone (ROCEPHIN) 1 g in dextrose 5 % 50 mL IVPB     1 g 100 mL/hr over 30 Minutes Intravenous  Once 07/01/12 2236 07/01/12 2336   07/01/12 2245  azithromycin (ZITHROMAX) 500 mg in dextrose 5 % 250 mL IVPB  Status:  Discontinued     500 mg 250 mL/hr over 60 Minutes Intravenous  Once 07/01/12 2236 07/02/12 0218      Assessment/Plan: s/p Procedure(s): MINOR REMOVAL OF PERITONEAL DIALYSIS CATHETER Advance diet Clean woung with peroxide and cover.  LOS: 9 days   Marta Lamas. Gae Bon, MD, FACS [phone] [facility] 07/10/2012

## 2012-07-10 NOTE — Progress Notes (Signed)
HD tx complete without adverse events.  Pt tolerated tx well.  Vital signs stable and pt is currently without complaint.  Report given to Herbie Baltimore, RN.

## 2012-07-10 NOTE — Progress Notes (Signed)
PT Cancellation Note  Patient Details Name: TREVELLE MCGURN MRN: [id number] DOB: [birth year]   Cancelled Treatment:    Reason Eval/Treat Not Completed: Medical issues which prohibited therapy;Other (comment) (pt on dialysis)   INGOLD,Dmarius Reeder 07/10/2012, 10:27 AM [facility] [phone] (pager)

## 2012-07-10 NOTE — Progress Notes (Signed)
NUTRITION FOLLOW UP  Intervention:    Advance diet per MD as able; recommend Renal 80/90-2-2 diet with Nepro or Resource Breeze supplements BID-TID.  While on clear liquids, add Resource Breeze PO TID to maximize oral intake.  Nutrition Dx:   Inadequate oral intake related to difficulty breathing as evidenced by clear liquid diet.  Goal:   Meet >/=90% estimated nutrition needs, unmet.   Monitor:   PO intake, weight trends, labs   Assessment:   Pt was intubated 4/20 for peritoneal dialysis catheter removal in the OR due to fungal peritonitis. Transitioning to HD; patient tolerated first HD well on 4/21. For another HD today. Was extubated 4/21. Required BiPAP over night last night due to hypoxic respiratory distress. Now receiving oxygen via non-rebreather mask. Patient remains on clear liquid diet. Appetite is good, but clear liquids are inadequate in protein and calories.  Height: Ht Readings from Last 1 Encounters:  07/02/12 6' (1.829 m)    Weight Status:   Wt Readings from Last 1 Encounters:  07/10/12 312 lb 2.7 oz (141.6 kg)  07/04/12  316 lb 4.8 oz (143.473 kg)  Admission weight 314 lb  Weight is trending back down.   Re-estimated needs:  Kcal: 2700-2900 Protein: 150-160 gm  Fluid: per MD   Skin: abdominal surgical incision   Diet Order:  Clear Liquids   Intake/Output Summary (Last 24 hours) at 07/10/12 1021 Last data filed at 07/09/12 1100  Gross per 24 hour  Intake    100 ml  Output      0 ml  Net    100 ml    Last BM: 4/22   Labs:   Recent Labs Lab 07/04/12 0445  07/05/12 0420  07/08/12 1227  07/09/12 0400 07/09/12 1100 07/10/12 0333  NA 139  < > 137  < > 140  < > 141 143 141  K 4.4  < > 4.5  < > 4.2  < > 3.7 4.0 4.8  CL 101  < > 99  < > 99  < > 100 102 100  CO2 27  < > 25  < > 26  < > 24 25 22   BUN 29*  < > 38*  < > 38*  < > 48* 51* 59*  CREATININE 8.36*  < > 11.03*  < > 10.17*  < > 11.39* 12.44* 13.98*  CALCIUM 8.5  < > 8.7  < > 9.1  <  > 9.2 9.3 9.2  MG 2.5  --  2.3  --   --   --   --   --   --   PHOS 5.7*  --  5.5*  --  6.9*  --  5.1* 7.2*  --   GLUCOSE 101*  < > 121*  < > 94  < > 78 91 124*  < > = values in this interval not displayed.  CBG (last 3)   Recent Labs  07/09/12 0813 07/10/12 0344 07/10/12 0754  GLUCAP 75 117* 126*    Scheduled Meds: . albuterol  2.5 mg Nebulization Q4H  . alteplase  2 mg Intracatheter Once  . antiseptic oral rinse  15 mL Mouth Rinse BID  . aspirin  325 mg Oral Daily  . bisoprolol  2.5 mg Oral Daily  . calcitRIOL  0.25 mcg Oral Custom  . darbepoetin (ARANESP) injection - DIALYSIS  100 mcg Intravenous Q Fri-HD  . lanthanum  1,000 mg Oral TID WC  . micafungin (MYCAMINE) IV  100 mg  Intravenous Daily  . pantoprazole  40 mg Oral QHS  . sodium chloride  3 mL Intravenous Q12H    Continuous Infusions:  None  Joaquin Courts, RD, LDN, CNSC Pager [phone] After Hours Pager [phone]

## 2012-07-10 NOTE — Progress Notes (Signed)
[facility]    Day #76micafungin   Subjective: Wearing facemask today during HD   Antibiotics:  Anti-infectives   Start     Dose/Rate Route Frequency Ordered Stop   07/07/12 2200  vancomycin (VANCOCIN) 1,250 mg in sodium chloride 0.9 % 250 mL IVPB  Status:  Discontinued     1,250 mg 250 mL/hr over 60 Minutes Intravenous  Once 07/07/12 2158 07/07/12 2206   07/06/12 1545  micafungin (MYCAMINE) 100 mg in sodium chloride 0.9 % 100 mL IVPB  Status:  Discontinued     100 mg 100 mL/hr over 1 Hours Intravenous Daily 07/06/12 1541 07/10/12 1123   07/05/12 1800  vancomycin (VANCOCIN) IVPB 750 mg/150 ml premix  Status:  Discontinued     750 mg 150 mL/hr over 60 Minutes Intravenous  Once 07/05/12 1352 07/05/12 1355   07/05/12 1600  vancomycin (VANCOCIN) IVPB 750 mg/150 ml premix     750 mg 150 mL/hr over 60 Minutes Intravenous  Once 07/05/12 1355 07/05/12 1956   07/04/12 2100  piperacillin-tazobactam (ZOSYN) IVPB 2.25 g  Status:  Discontinued     2.25 g 100 mL/hr over 30 Minutes Intravenous 3 times per day 07/04/12 1120 07/08/12 1103   07/02/12 2200  piperacillin-tazobactam (ZOSYN) IVPB 3.375 g  Status:  Discontinued     3.375 g 12.5 mL/hr over 240 Minutes Intravenous Every 6 hours 07/02/12 1703 07/04/12 1120   07/02/12 2000  vancomycin (VANCOCIN) 1,500 mg in sodium chloride 0.9 % 500 mL IVPB  Status:  Discontinued     1,500 mg 250 mL/hr over 120 Minutes Intravenous Every 24 hours 07/02/12 1708 07/03/12 1245   07/02/12 0800  piperacillin-tazobactam (ZOSYN) IVPB 2.25 g  Status:  Discontinued     2.25 g 100 mL/hr over 30 Minutes Intravenous Every 8 hours 07/02/12 0054 07/02/12 1703   07/02/12 0100  vancomycin (VANCOCIN) 2,500 mg in sodium chloride 0.9 % 500 mL IVPB     2,500 mg 250 mL/hr over 120 Minutes Intravenous Every 12 hours 07/02/12 0054 07/02/12 0409   07/02/12 0100  piperacillin-tazobactam (ZOSYN) IVPB 2.25 g     2.25 g 100 mL/hr over 30 Minutes  Intravenous  Once 07/02/12 0054 07/02/12 0519   07/01/12 2245  cefTRIAXone (ROCEPHIN) 1 g in dextrose 5 % 50 mL IVPB     1 g 100 mL/hr over 30 Minutes Intravenous  Once 07/01/12 2236 07/01/12 2336   07/01/12 2245  azithromycin (ZITHROMAX) 500 mg in dextrose 5 % 250 mL IVPB  Status:  Discontinued     500 mg 250 mL/hr over 60 Minutes Intravenous  Once 07/01/12 2236 07/02/12 0218      Medications: Scheduled Meds: . alteplase  2 mg Intracatheter Once  . antiseptic oral rinse  15 mL Mouth Rinse BID  . aspirin  325 mg Oral Daily  . bisoprolol  2.5 mg Oral Daily  . calcitRIOL  0.25 mcg Oral Custom  . darbepoetin (ARANESP) injection - DIALYSIS  100 mcg Intravenous Q Fri-HD  . feeding supplement  1 Container Oral TID BM  . lanthanum  1,000 mg Oral TID WC  . pantoprazole  40 mg Oral QHS   Continuous Infusions:  PRN Meds:.sodium chloride, sodium chloride, sodium chloride, acetaminophen (TYLENOL) oral liquid 160 mg/5 mL, albuterol, alteplase, clonazePAM, feeding supplement (NEPRO CARB STEADY), fentaNYL, heparin, [START ON 07/11/2012] heparin, lidocaine (PF), lidocaine-prilocaine, oxyCODONE-acetaminophen, pentafluoroprop-tetrafluoroeth, simethicone   Objective: Weight change: -9 lb 0.6 oz (-4.1 kg) No intake or  output data in the 24 hours ending 07/10/12 1220 Blood pressure 122/70, pulse 85, temperature 98.3 F (36.8 C), temperature source Oral, resp. rate 25, height 6' (1.829 m), weight 312 lb 6.3 oz (141.7 kg), SpO2 98.00%. Temp:  [97.5 F (36.4 C)-100 F (37.8 C)] 98.3 F (36.8 C) (04/22 0811) Pulse Rate:  [82-101] 85 (04/22 1200) Resp:  [19-37] 25 (04/22 1200) BP: (101-183)/(48-90) 122/70 mmHg (04/22 1200) SpO2:  [80 %-100 %] 98 % (04/22 1200) FiO2 (%):  [39.5 %-100 %] 100 % (04/22 1100) Weight:  [312 lb 2.7 oz (141.6 kg)-312 lb 6.3 oz (141.7 kg)] 312 lb 6.3 oz (141.7 kg) (04/22 1023)  Physical Exam: General: Alert and awake, oriented x3, not wearing face mask HEENT: anicteric  sclera, pupils reactive to light and accommodation, EOMI, oropharynx clear and without exudate, neck with HD catheter  CVS regular rate, normal r, no murmur rubs or gallops  Chest:dim breath sounds at bases  Abdomen: Protuberant diffusely distended and tender to palpation throughout without rebound positive bowel sounds , bandages from PD catheter removal Skin: no rashes  Neuro: nonfocal, strength and sensation intact  Lab Results:  Recent Labs  07/09/12 0400 07/10/12 0333  WBC 9.0 10.6*  HGB 7.6* 7.6*  HCT 23.3* 24.2*  PLT 151 176    BMET  Recent Labs  07/09/12 1100 07/10/12 0333  NA 143 141  K 4.0 4.8  CL 102 100  CO2 25 22  GLUCOSE 91 124*  BUN 51* 59*  CREATININE 12.44* 13.98*  CALCIUM 9.3 9.2    Micro Results: Recent Results (from the past 240 hour(s))  MRSA PCR SCREENING     Status: None   Collection Time    07/02/12 12:17 AM      Result Value Range Status   MRSA by PCR NEGATIVE  NEGATIVE Final   Comment:            The GeneXpert MRSA Assay (FDA     approved for NASAL specimens     only), is one component of a     comprehensive MRSA colonization     surveillance program. It is not     intended to diagnose MRSA     infection nor to guide or     monitor treatment for     MRSA infections.  CULTURE, BLOOD (ROUTINE X 2)     Status: None   Collection Time    07/02/12  3:30 AM      Result Value Range Status   Specimen Description BLOOD LEFT HAND   Final   Special Requests BOTTLES DRAWN AEROBIC ONLY [facility]   Final   Culture  Setup Time 07/02/2012 07:34   Final   Culture NO GROWTH 5 DAYS   Final   Report Status 07/08/2012 FINAL   Final  CULTURE, BLOOD (ROUTINE X 2)     Status: None   Collection Time    07/02/12  3:45 AM      Result Value Range Status   Specimen Description BLOOD LEFT HAND   Final   Special Requests BOTTLES DRAWN AEROBIC ONLY [facility]   Final   Culture  Setup Time 07/02/2012 07:34   Final   Culture NO GROWTH 5 DAYS   Final   Report Status  07/08/2012 FINAL   Final  BODY FLUID CULTURE     Status: None   Collection Time    07/02/12  5:57 AM      Result Value Range Status   Specimen Description  FLUID PERITONEAL   Final   Special Requests Normal   Final   Gram Stain     Final   Value: ABUNDANT WBC PRESENT,BOTH PMN AND MONONUCLEAR     NO ORGANISMS SEEN   Culture     Final   Value: RARE CANDIDA PARAPSILOSIS     Note: CRITICAL RESULT CALLED TO, READ BACK BY AND VERIFIED WITH: NURSE AMARUGE@1431  ON [id number] BY [facility]   Report Status 07/10/2012 FINAL   Final  FUNGUS CULTURE W SMEAR     Status: None   Collection Time    07/08/12  8:30 AM      Result Value Range Status   Specimen Description CATH TIP   Final   Special Requests POF ZOSYN   Final   Fungal Smear FEW YEAST   Final   Culture CULTURE IN PROGRESS FOR FOUR WEEKS   Final   Report Status PENDING   Incomplete  CATH TIP CULTURE     Status: None   Collection Time    07/08/12  8:30 AM      Result Value Range Status   Specimen Description CATH TIP   Final   Special Requests POF ZOSYN   Final   Culture NO GROWTH   Final   Report Status PENDING   Incomplete  MRSA PCR SCREENING     Status: Abnormal   Collection Time    07/08/12 11:01 AM      Result Value Range Status   MRSA by PCR INVALID RESULTS, SPECIMEN SENT FOR CULTURE (*) NEGATIVE Final   Comment:            The GeneXpert MRSA Assay (FDA     approved for NASAL specimens     only), is one component of a     comprehensive MRSA colonization     surveillance program. It is not     intended to diagnose MRSA     infection nor to guide or     monitor treatment for     MRSA infections.  MRSA CULTURE     Status: None   Collection Time    07/08/12 11:01 AM      Result Value Range Status   Specimen Description NASAL SWAB   Final   Special Requests NONE MRSA PCR WAS INVALID TWICE   Final   Culture     Final   Value: NO STAPHYLOCOCCUS AUREUS ISOLATED     Note: NOMRSA   Report Status 07/10/2012 FINAL   Final     Studies/Results: Dg Chest Port 1 View  07/10/2012  *RADIOLOGY REPORT*  Clinical Data: Post extubation, stridor  PORTABLE CHEST - 1 VIEW  Comparison: Portable exam 0801 hours compared to 07/09/2012  Findings: Right jugular central venous catheter stable tip projecting over SVC. Nasogastric and endotracheal tubes no longer identified. Enlargement of cardiac silhouette. Persistent elevation of right diaphragm with right basilar atelectasis. Question minimal perihilar infiltrate versus edema. No gross segmental consolidation, pleural effusion or pneumothorax.  IMPRESSION: Mild right basilar atelectasis. Enlargement of cardiac silhouette with question residual perihilar edema or infiltrate.   Original Report Authenticated By: Ulyses Southward, M.D.    Dg Chest Port 1 View  07/09/2012  *RADIOLOGY REPORT*  Clinical Data: Respiratory failure and pulmonary edema.  PORTABLE CHEST - 1 VIEW  Comparison: 07/08/2012  Findings: Endotracheal tube tip projects approximately 1.5 cm above the carina.  Nasogastric tube extends below the diaphragm.  Large bore right jugular central venous catheter tip remains in the SVC.  Lungs show improved volumes bilaterally with residual suggestion of mild edema and bibasilar atelectasis present.  No large pleural effusions are seen.  IMPRESSION: Improved aeration bilaterally with suspected residual mild edema.   Original Report Authenticated By: Irish Lack, M.D.       Assessment/Plan: Gary Rivera is a 52 y.o. 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 --Past pharmacy to dose of fluconazole but I believe he will need about 200 mg of fluconazole which will be able to be taken orally --I WOULD ACTUALLY GIVE HIM A TOTAL OF 4 WEEKS POST PD REMOVAL or 26 MORE DAYS OF FLUCONAZOLE --WOULD ALSO ASK MICRO TO SEND OUT  SUSCEPTIBILITY OF ORGANISM TO FLUCONAZOLE,  VORICONAZOLE  I will arrange HSFU in our clinic in next 2-3 weeks but otherwise will sign off for now and now  These do not hesitate to call with further questions    LOS: 9 days   Acey Lav 07/10/2012, 12:20 PM

## 2012-07-10 NOTE — Progress Notes (Signed)
Placed pt. Back on bipap, due to pt. sats dropping.

## 2012-07-10 NOTE — Progress Notes (Signed)
Patient has been refusing CBG's since 8am. RN concerned because CBG's prior had been in low 70's. Patient requiring bipap and therefore is not eating. Patient educated on importance of CBG testing his blood sugars. Will continue to monitor. On call resident notified.   Wyn Quaker RN

## 2012-07-10 NOTE — Progress Notes (Signed)
ANTIBIOTIC CONSULT NOTE - INITIAL  Pharmacy Consult for fluconazole Indication: peritonitis  Allergies  Allergen Reactions  . Renvela (Sevelamer) Nausea And Vomiting    Patient Measurements: Height: 6' (182.9 cm) Weight: 312 lb 6.3 oz (141.7 kg) IBW/kg (Calculated) : 77.6   Vital Signs: Temp: 98.2 F (36.8 C) (04/22 1230) Temp src: Oral (04/22 1230) BP: 104/65 mmHg (04/22 1345) Pulse Rate: 101 (04/22 1345) Intake/Output from previous day: 04/21 0701 - 04/22 0700 In: 125.9 [I.V.:25.9; IV Piggyback:100] Out: -  Intake/Output from this shift:    Labs:  Recent Labs  07/08/12 0758  07/09/12 0400 07/09/12 1100 07/10/12 0333  WBC 11.3*  --  9.0  --  10.6*  HGB 8.5*  --  7.6*  --  7.6*  PLT 163  --  151  --  176  CREATININE  --   < > 11.39* 12.44* 13.98*  < > = values in this interval not displayed. Estimated Creatinine Clearance: 9 ml/min (by C-G formula based on Cr of 13.98). No results found for this basename: VANCOTROUGH, Leodis Binet, VANCORANDOM, GENTTROUGH, GENTPEAK, GENTRANDOM, TOBRATROUGH, TOBRAPEAK, TOBRARND, AMIKACINPEAK, AMIKACINTROU, AMIKACIN,  in the last 72 hours   Microbiology: Recent Results (from the past 720 hour(s))  BODY FLUID CULTURE     Status: None   Collection Time    06/19/12  8:22 PM      Result Value Range Status   Specimen Description PERITONEAL FLUID   Final   Special Requests NONE   Final   Gram Stain     Final   Value: NO WBC SEEN     NO ORGANISMS SEEN   Culture NO GROWTH 3 DAYS   Final   Report Status 06/23/2012 FINAL   Final  MRSA PCR SCREENING     Status: None   Collection Time    07/02/12 12:17 AM      Result Value Range Status   MRSA by PCR NEGATIVE  NEGATIVE Final   Comment:            The GeneXpert MRSA Assay (FDA     approved for NASAL specimens     only), is one component of a     comprehensive MRSA colonization     surveillance program. It is not     intended to diagnose MRSA     infection nor to guide or   monitor treatment for     MRSA infections.  CULTURE, BLOOD (ROUTINE X 2)     Status: None   Collection Time    07/02/12  3:30 AM      Result Value Range Status   Specimen Description BLOOD LEFT HAND   Final   Special Requests BOTTLES DRAWN AEROBIC ONLY [facility]   Final   Culture  Setup Time 07/02/2012 07:34   Final   Culture NO GROWTH 5 DAYS   Final   Report Status 07/08/2012 FINAL   Final  CULTURE, BLOOD (ROUTINE X 2)     Status: None   Collection Time    07/02/12  3:45 AM      Result Value Range Status   Specimen Description BLOOD LEFT HAND   Final   Special Requests BOTTLES DRAWN AEROBIC ONLY [facility]   Final   Culture  Setup Time 07/02/2012 07:34   Final   Culture NO GROWTH 5 DAYS   Final   Report Status 07/08/2012 FINAL   Final  BODY FLUID CULTURE     Status: None   Collection Time  07/02/12  5:57 AM      Result Value Range Status   Specimen Description FLUID PERITONEAL   Final   Special Requests Normal   Final   Gram Stain     Final   Value: ABUNDANT WBC PRESENT,BOTH PMN AND MONONUCLEAR     NO ORGANISMS SEEN   Culture     Final   Value: RARE CANDIDA PARAPSILOSIS     Note: CRITICAL RESULT CALLED TO, READ BACK BY AND VERIFIED WITH: NURSE AMARUGE@1431  ON [id number] BY [facility]   Report Status 07/10/2012 FINAL   Final  FUNGUS CULTURE W SMEAR     Status: None   Collection Time    07/08/12  8:30 AM      Result Value Range Status   Specimen Description CATH TIP   Final   Special Requests POF ZOSYN   Final   Fungal Smear FEW YEAST   Final   Culture CULTURE IN PROGRESS FOR FOUR WEEKS   Final   Report Status PENDING   Incomplete  CATH TIP CULTURE     Status: None   Collection Time    07/08/12  8:30 AM      Result Value Range Status   Specimen Description CATH TIP   Final   Special Requests POF ZOSYN   Final   Culture NO GROWTH   Final   Report Status PENDING   Incomplete  MRSA PCR SCREENING     Status: Abnormal   Collection Time    07/08/12 11:01 AM      Result Value Range Status    MRSA by PCR INVALID RESULTS, SPECIMEN SENT FOR CULTURE (*) NEGATIVE Final   Comment:            The GeneXpert MRSA Assay (FDA     approved for NASAL specimens     only), is one component of a     comprehensive MRSA colonization     surveillance program. It is not     intended to diagnose MRSA     infection nor to guide or     monitor treatment for     MRSA infections.  MRSA CULTURE     Status: None   Collection Time    07/08/12 11:01 AM      Result Value Range Status   Specimen Description NASAL SWAB   Final   Special Requests NONE MRSA PCR WAS INVALID TWICE   Final   Culture     Final   Value: NO STAPHYLOCOCCUS AUREUS ISOLATED     Note: NOMRSA   Report Status 07/10/2012 FINAL   Final    Medical History: Past Medical History  Diagnosis Date  . Hypertension   . Renal disorder   . CHF (congestive heart failure)   . Peritoneal dialysis catheter in place   . Anemia     patient reporats that last hgb 7.9 a week ago  . Anxiety state, unspecified     Medications:  Prescriptions prior to admission  Medication Sig Dispense Refill  . albuterol (PROVENTIL HFA;VENTOLIN HFA) 108 (90 BASE) MCG/ACT inhaler Inhale 2 puffs into the lungs every 6 (six) hours as needed for wheezing.  1 Inhaler  2  . aspirin EC 81 MG tablet Take 81 mg by mouth daily.      Marland Kitchen b complex-vitamin c-folic acid (NEPHRO-VITE) 0.8 MG TABS Take 0.8 mg by mouth daily.      . bisoprolol (ZEBETA) 5 MG tablet Take 2.5 mg by mouth daily.      Marland Kitchen  calcitRIOL (ROCALTROL) 0.25 MCG capsule Take 0.25 mcg by mouth See admin instructions. Takes 1 capsule daily Monday through friday      . clonazePAM (KLONOPIN) 0.5 MG tablet Take 0.5 mg by mouth 2 (two) times daily as needed. anxiety      . famotidine (PEPCID) 20 MG tablet Take 20 mg by mouth at bedtime as needed for heartburn.      . lanthanum (FOSRENOL) 1000 MG chewable tablet Chew 1,000 mg by mouth 3 (three) times daily with meals.      . Multiple Vitamin (MULTIVITAMIN) tablet  Take 1 tablet by mouth daily.      Marland Kitchen omeprazole (PRILOSEC) 20 MG capsule Take 20 mg by mouth daily.      . vitamin B-12 (CYANOCOBALAMIN) 100 MCG tablet Take 50 mcg by mouth daily.       Assessment: 52 year old man with candida parapsilosis peritonitis associated with PD catheter which has been removed.  Micafungin to change to fluconazole.  Goal of Therapy:  treat peritonitis  Plan:  Fluconazole 400mg  IV x 1, then 200mg  IV daily - needs 4 weeks of total treatment.  Will follow up ability to take oral meds reliably.  Mickeal Skinner 07/10/2012,1:52 PM

## 2012-07-10 NOTE — Procedures (Signed)
I was present at this dialysis session. I have reviewed the session itself and made appropriate changes.   [name], MD [employer] 07/10/2012, 1:55 PM

## 2012-07-11 ENCOUNTER — Inpatient Hospital Stay (HOSPITAL_COMMUNITY): Payer: Medicare Other

## 2012-07-11 DIAGNOSIS — R5381 Other malaise: Secondary | ICD-10-CM

## 2012-07-11 LAB — CATH TIP CULTURE: Culture: >100

## 2012-07-11 LAB — GLUCOSE, CAPILLARY: Glucose-Capillary: 135 mg/dL — ABNORMAL HIGH (ref 70–99)

## 2012-07-11 LAB — RENAL FUNCTION PANEL
BUN: 46 mg/dL — ABNORMAL HIGH (ref 6–23)
CO2: 28 mEq/L (ref 19–32)
Glucose, Bld: 138 mg/dL — ABNORMAL HIGH (ref 70–99)
Phosphorus: 6.9 mg/dL — ABNORMAL HIGH (ref 2.3–4.6)
Potassium: 4.4 mEq/L (ref 3.5–5.1)

## 2012-07-11 MED ORDER — LIDOCAINE-PRILOCAINE 2.5-2.5 % EX CREA: 1.0000 "application " | TOPICAL_CREAM | CUTANEOUS | Status: DC | PRN

## 2012-07-11 MED ORDER — HEPARIN SODIUM (PORCINE) 1000 UNIT/ML DIALYSIS
3000.0000 [IU] | INTRAMUSCULAR | Status: DC | PRN
  Filled 2012-07-11: qty 3

## 2012-07-11 MED ORDER — FLUCONAZOLE 200 MG PO TABS
200.0000 mg | ORAL_TABLET | Freq: Every day | ORAL | Status: DC
  Administered 2012-07-11 – 2012-07-15 (×5): 200 mg via ORAL
  Filled 2012-07-11 (×6): qty 1

## 2012-07-11 MED ORDER — SODIUM CHLORIDE 0.9 % IV SOLN: 100.0000 mL | INTRAVENOUS | Status: DC | PRN

## 2012-07-11 MED ORDER — LIDOCAINE HCL (PF) 1 % IJ SOLN: 5.0000 mL | INTRAMUSCULAR | Status: DC | PRN

## 2012-07-11 MED ORDER — NEPRO/CARBSTEADY PO LIQD: 237.0000 mL | ORAL | Status: DC | PRN

## 2012-07-11 MED ORDER — HEPARIN SODIUM (PORCINE) 1000 UNIT/ML DIALYSIS: 1000.0000 [IU] | INTRAMUSCULAR | Status: DC | PRN

## 2012-07-11 MED ORDER — ALTEPLASE 2 MG IJ SOLR
2.0000 mg | Freq: Once | INTRAMUSCULAR | Status: AC | PRN
  Filled 2012-07-11: qty 2

## 2012-07-11 MED ORDER — PENTAFLUOROPROP-TETRAFLUOROETH EX AERO: 1.0000 "application " | INHALATION_SPRAY | CUTANEOUS | Status: DC | PRN

## 2012-07-11 NOTE — Progress Notes (Signed)
PULMONARY  / CRITICAL CARE MEDICINE  Name: MAYSON MCNEISH MRN: [id number] DOB: 29-Nov-1960    ADMISSION DATE:  07/01/2012 CONSULTATION DATE:  07/01/12  REFERRING MD :  Dr. Juleen China PRIMARY SERVICE: PCCM  CHIEF COMPLAINT:  Acute respiratory failure post op  BRIEF PATIENT DESCRIPTION: Mr. Fillinger 52 yo AA man pmh of pulmonary HTN, systolic HF, ESRD on peritoneal dialysis, and OHS presented with abdominal pain after 8mg  of morphine developed acute respiratory failure with little improvement from narcan and refused bipap. pt was intubated and PCCM called to manage. Extubated. Comes back to 2100 post op resp failure after fungal peritonitis and catheter removal in OR.  SIGNIFICANT EVENTS / STUDIES:  4/13 Adm with dx sepsis, resp source suspected 4/13 CT abd: slight bibasilar infiltrates, no dilated bowels or acute process 4/13 CXR: bibasilar infiltrates, L effusion, under inflated lungs 4/14 Vasopressors and CRRT initiated 4/15 Extubated. 4/20- peritoneal catheter removal in OR, intubated 4/21- 1st HD 4/20, tolerated well, 4L removed. 4/22- 2nd HD - tolerated well. On non-re breather.  LINES / TUBES: ETT 4/13 >>4/15 L IJ CVL 4/14 >> R IJ HD cath >>  ETT 4/21>>4/21  CULTURES: 4/14 peritoneal fluid Cx >> yeast>>>Candida Parapsilosis 4/13 sputum Cx >neg 4/14 BCx>>>neg  ANTIBIOTICS: ABX per ID 4/13 rocephin >>>4/13 4/13 azithromax >>>4/13 4/13 IV Vanc >>>4/20 4/13 IV Zosyn >>>4/20 mycofungin 4/18>>>4/22 Diflucan 4/22>>>  SUBJECTIVE:  On nasal cannulae  VITAL SIGNS: Temp:  [97.4 F (36.3 C)-98.3 F (36.8 C)] 98.1 F (36.7 C) (04/23 0400) Pulse Rate:  [80-102] 80 (04/23 0705) Resp:  [18-32] 23 (04/23 0705) BP: (92-153)/(51-109) 109/79 mmHg (04/23 0705) SpO2:  [68 %-100 %] 98 % (04/23 0705) FiO2 (%):  [50 %-100 %] 100 % (04/22 2200) Weight:  [302 lb 14.6 oz (137.4 kg)-312 lb 6.3 oz (141.7 kg)] 302 lb 14.6 oz (137.4 kg) (04/23 0700) HEMODYNAMICS:   Nasal cannula ok  sats INTAKE / OUTPUT: Intake/Output     04/22 0701 - 04/23 0700 04/23 0701 - 04/24 0700   P.O. 180    I.V. (mL/kg)     IV Piggyback 200    Total Intake(mL/kg) 380 (2.8)    Other 5004    Total Output 5004     Net -4624          Stool Occurrence 2 x     PHYSICAL EXAMINATION: General:  Awake and NAD Neuro:  Alert and oriented.  HEENT: , EOMI, nasal cannula Cardiovascular: S1 S2  RRR, no murmurs/rubs/gallops Lungs: clear, stridor gone Abdomen:  Distended, peritoneal site dressed, non-erythematous/non-indurated, no catheter present now Musculoskeletal:  intact Skin: dry    LABS:  Recent Labs Lab 07/04/12 2300  07/05/12 0420 07/05/12 0542  07/07/12 0500 07/08/12 0758  07/08/12 1228 07/08/12 1613 07/09/12 0400 07/09/12 1100 07/10/12 0333 07/11/12 0350  HGB 8.5*  --  8.0*  --   < >  --  8.5*  --   --   --  7.6*  --  7.6*  --   WBC 12.0*  --  17.0*  --   < >  --  11.3*  --   --   --  9.0  --  10.6*  --   PLT 218  --  172  --   < >  --  163  --   --   --  151  --  176  --   NA 137  --  137  --   < >  --   --   < >  139  --  141 143 141 140  K 4.1  --  4.5  --   < >  --   --   < > 4.2  --  3.7 4.0 4.8 4.4  CL 99  --  99  --   < >  --   --   < > 99  --  100 102 100 99  CO2 25  --  25  --   < >  --   --   < > 26  --  24 25 22 28   GLUCOSE 110*  --  121*  --   < >  --   --   < > 92  --  78 91 124* 138*  BUN 34*  --  38*  --   < >  --   --   < > 38*  --  48* 51* 59* 46*  CREATININE 10.20*  --  11.03*  --   < >  --   --   < > 9.87*  --  11.39* 12.44* 13.98* 9.47*  CALCIUM 8.5  --  8.7  --   < >  --   --   < > 8.9  --  9.2 9.3 9.2 9.3  MG  --   --  2.3  --   --   --   --   --   --   --   --   --   --   --   PHOS  --   --  5.5*  --   --   --   --   < >  --   --  5.1* 7.2*  --  6.9*  ALBUMIN  --   --   --   --   --   --   --   < >  --   --  2.4* 2.4*  --  2.6*  LATICACIDVEN 0.8  --   --   --   --   --   --   --   --   --   --   --   --   --   TROPONINI <0.30  --   --   --   --   --    --   --  <0.30  --   --   --   --   --   PROCALCITON  --   --   --   --   --  34.94  --   --   --   --   --   --   --   --   PHART  --   < >  --  7.238*  --   --   --   --  7.208* 7.303*  --   --   --   --   PCO2ART  --   < >  --  59.4*  --   --   --   --  69.3* 51.3*  --   --   --   --   PO2ART  --   < >  --  122.0*  --   --   --   --  108.0* 63.0*  --   --   --   --   < > = values in this interval not displayed.  Recent Labs Lab 07/10/12 1642 07/10/12 2209 07/10/12 2338 07/11/12 0354 07/11/12 0719  GLUCAP 192*  144* 143* 133* 135*   4/22 CXR: mild subglottic edema, mild pulm edema  ASSESSMENT / PLAN: Principal Problem:   Acute respiratory failure with hypercapnia Active Problems:   Systolic congestive heart failure with reduced left ventricular function, NYHA class 1- EF 40-45% 2011 ECHO   CKD (chronic kidney disease) requiring chronic PERITONEAL dialysis   HTN (hypertension)   Hypercapnia   Pulmonary edema   Obesity hypoventilation syndrome   Septic shock(785.52)   Peritoneal dialysis catheter exit site infection   PULMONARY A: Acute Resp failure resolved Overt subglottic edema seen on CXR AM 4/22>>improved 4/23 and resolved. P:   D/c steroids Cont oxygen  No bipap Needs sleep study when d/c   CARDIOVASCULAR A: systolic heart failure EF 40% last Echo 6/13 Stable at present P:  - no active intervention needed now.  RENAL A:  ESRD, chronic peritoneal dialysis- removed PD cath 4/20. Started HD 4/20. 4L removed. P:   - HD per renal.  - for perm cath placement 4/25 per vasc surgery  GASTROINTESTINAL A: High risk stress P:   - advance to renal diet , passed swallow easily 4/22 ppi for SUP  HEMATOLOGIC A:risk dvt, Hb- 7.6- at baseline. P:  -Monitor CBC intermittently - Scd  INFECTIOUS A:  Yeast peritonitis Severe sepsis- resolved. P:   - 4/22 transitioned to flucanozole per ID to complete 4 weeks therapy. - fluconazole 200 mg daily po. PER  ID  ENDOCRINE A:  At risk hypoglycemia P:   - Cont to monitor. - cont SSI for glu > 180, ssi  NEUROLOGIC A:  Agitated, high risk delirium P:   -  percocet PRN for pain control.  PATEL,RAVI, MD 7:36 AM   TODAY'S SUMMARY: 52 yo AAM with peritoneal dialysis and PD catheter fungal peritonitis s/p PD catheter removal. Pt better 4/23 post HD. Transfer to med-surg- 6700 today. [facility] hospitalist to resume care tomorrow am.  Ccm time 35 min    Beeper  [phone]  Cell  [phone]  If no response or cell goes to voicemail, call beeper [phone]  Dorcas Carrow Beeper  [phone]  Cell  [phone]  If no response or cell goes to voicemail, call beeper [phone]

## 2012-07-11 NOTE — Progress Notes (Signed)
[facility] ROUNDING NOTE   Subjective:   History: looks a lot better  Objective:  Vital signs in last 24 hours:  Temp:  [97.4 F (36.3 C)-98.2 F (36.8 C)] 98.1 F (36.7 C) (04/23 0400) Pulse Rate:  [75-102] 78 (04/23 0930) Resp:  [18-29] 20 (04/23 0930) BP: (87-139)/(51-109) 100/56 mmHg (04/23 0920) SpO2:  [68 %-100 %] 95 % (04/23 0930) FiO2 (%):  [50 %-100 %] 100 % (04/22 2200) Weight:  [137.4 kg (302 lb 14.6 oz)] 137.4 kg (302 lb 14.6 oz) (04/23 0700)  Weight change: 0.1 kg (3.5 oz) Filed Weights   07/10/12 1023 07/10/12 1407 07/11/12 0700  Weight: 141.7 kg (312 lb 6.3 oz) 137.4 kg (302 lb 14.6 oz) 137.4 kg (302 lb 14.6 oz)    Intake/Output: I/O last 3 completed shifts: In: 380 [P.O.:180; IV Piggyback:200] Out: 5004 [Other:5004]   Intake/Output this shift:  Total I/O In: 360 [P.O.:360] Out: -  Gen: nasal O2, much better CVS- RRR systolic murmur R IJ temp cath 3-lumen Resp- clear bilat ABD- distended, PD cath is out, nontender EXT- no LE edema   Basic Metabolic Panel:  Recent Labs Lab 07/05/12 0420  07/08/12 1227 07/08/12 1228 07/09/12 0400 07/09/12 1100 07/10/12 0333 07/11/12 0350  NA 137  < > 140 139 141 143 141 140  K 4.5  < > 4.2 4.2 3.7 4.0 4.8 4.4  CL 99  < > 99 99 100 102 100 99  CO2 25  < > 26 26 24 25 22 28   GLUCOSE 121*  < > 94 92 78 91 124* 138*  BUN 38*  < > 38* 38* 48* 51* 59* 46*  CREATININE 11.03*  < > 10.17* 9.87* 11.39* 12.44* 13.98* 9.47*  CALCIUM 8.7  < > 9.1 8.9 9.2 9.3 9.2 9.3  MG 2.3  --   --   --   --   --   --   --   PHOS 5.5*  --  6.9*  --  5.1* 7.2*  --  6.9*  < > = values in this interval not displayed.  Liver Function Tests:  Recent Labs Lab 07/08/12 1227 07/09/12 0400 07/09/12 1100 07/11/12 0350  ALBUMIN 2.3* 2.4* 2.4* 2.6*   No results found for this basename: LIPASE, AMYLASE,  in the last 168 hours No results found for this basename: AMMONIA,  in the last 168 hours  CBC:  Recent Labs Lab  07/05/12 0420 07/06/12 0440 07/08/12 0758 07/09/12 0400 07/10/12 0333  WBC 17.0* 11.7* 11.3* 9.0 10.6*  HGB 8.0* 7.2* 8.5* 7.6* 7.6*  HCT 25.2* 23.2* 27.4* 23.3* 24.2*  MCV 95.8 96.3 92.3 89.6 90.6  PLT 172 150 163 151 176    Cardiac Enzymes:  Recent Labs Lab 07/04/12 2300 07/08/12 1228  TROPONINI <0.30 <0.30    BNP: No components found with this basename: POCBNP,   CBG:  Recent Labs Lab 07/10/12 1642 07/10/12 2209 07/10/12 2338 07/11/12 0354 07/11/12 0719  GLUCAP 192* 144* 143* 133* 135*    Microbiology: Results for orders placed during the hospital encounter of 07/01/12  MRSA PCR SCREENING     Status: None   Collection Time    07/02/12 12:17 AM      Result Value Range Status   MRSA by PCR NEGATIVE  NEGATIVE Final   Comment:            The GeneXpert MRSA Assay (FDA     approved for NASAL specimens     only), is  one component of a     comprehensive MRSA colonization     surveillance program. It is not     intended to diagnose MRSA     infection nor to guide or     monitor treatment for     MRSA infections.  CULTURE, BLOOD (ROUTINE X 2)     Status: None   Collection Time    07/02/12  3:30 AM      Result Value Range Status   Specimen Description BLOOD LEFT HAND   Final   Special Requests BOTTLES DRAWN AEROBIC ONLY [facility]   Final   Culture  Setup Time 07/02/2012 07:34   Final   Culture NO GROWTH 5 DAYS   Final   Report Status 07/08/2012 FINAL   Final  CULTURE, BLOOD (ROUTINE X 2)     Status: None   Collection Time    07/02/12  3:45 AM      Result Value Range Status   Specimen Description BLOOD LEFT HAND   Final   Special Requests BOTTLES DRAWN AEROBIC ONLY [facility]   Final   Culture  Setup Time 07/02/2012 07:34   Final   Culture NO GROWTH 5 DAYS   Final   Report Status 07/08/2012 FINAL   Final  BODY FLUID CULTURE     Status: None   Collection Time    07/02/12  5:57 AM      Result Value Range Status   Specimen Description FLUID PERITONEAL   Final    Special Requests Normal   Final   Gram Stain     Final   Value: ABUNDANT WBC PRESENT,BOTH PMN AND MONONUCLEAR     NO ORGANISMS SEEN   Culture     Final   Value: RARE CANDIDA PARAPSILOSIS     Note: CRITICAL RESULT CALLED TO, READ BACK BY AND VERIFIED WITH: NURSE AMARUGE@[id number]  ON [id number] BY [facility]   Report Status 07/10/2012 FINAL   Final  FUNGUS CULTURE W SMEAR     Status: None   Collection Time    07/08/12  8:30 AM      Result Value Range Status   Specimen Description CATH TIP   Final   Special Requests POF ZOSYN   Final   Fungal Smear FEW YEAST   Final   Culture CULTURE IN PROGRESS FOR FOUR WEEKS   Final   Report Status PENDING   Incomplete  CATH TIP CULTURE     Status: None   Collection Time    07/08/12  8:30 AM      Result Value Range Status   Specimen Description CATH TIP   Final   Special Requests POF ZOSYN   Final   Culture NO GROWTH   Final   Report Status PENDING   Incomplete  MRSA PCR SCREENING     Status: Abnormal   Collection Time    07/08/12 11:01 AM      Result Value Range Status   MRSA by PCR INVALID RESULTS, SPECIMEN SENT FOR CULTURE (*) NEGATIVE Final   Comment:            The GeneXpert MRSA Assay (FDA     approved for NASAL specimens     only), is one component of a     comprehensive MRSA colonization     surveillance program. It is not     intended to diagnose MRSA     infection nor to guide or     monitor treatment for  MRSA infections.  MRSA CULTURE     Status: None   Collection Time    07/08/12 11:01 AM      Result Value Range Status   Specimen Description NASAL SWAB   Final   Special Requests NONE MRSA PCR WAS INVALID TWICE   Final   Culture     Final   Value: NO STAPHYLOCOCCUS AUREUS ISOLATED     Note: NOMRSA   Report Status 07/10/2012 FINAL   Final      Medications:     . alteplase  2 mg Intracatheter Once  . antiseptic oral rinse  15 mL Mouth Rinse BID  . aspirin  325 mg Oral Daily  . bisoprolol  2.5 mg Oral Daily  . calcitRIOL   0.25 mcg Oral Custom  . darbepoetin (ARANESP) injection - DIALYSIS  100 mcg Intravenous Q Fri-HD  . feeding supplement  1 Container Oral TID BM  . fluconazole (DIFLUCAN) IV  200 mg Intravenous Q24H  . insulin aspart  0-9 Units Subcutaneous TID WC  . lanthanum  1,000 mg Oral TID WC  . pantoprazole  40 mg Oral QHS   sodium chloride, sodium chloride, sodium chloride, acetaminophen (TYLENOL) oral liquid 160 mg/5 mL, albuterol, clonazePAM, feeding supplement (NEPRO CARB STEADY), fentaNYL, heparin, heparin, lidocaine (PF), lidocaine-prilocaine, oxyCODONE-acetaminophen, pentafluoroprop-tetrafluoroeth, simethicone  Assessment/ Plan:   1. Septic shock / fungal peritonitis, s/p PD cath removal 4/20- on fluconazole now. Improved 2. Acute resp failure / pulm edema- resolved clinically. 3. ESRD, transitioned to HD from PD. HD TTS in hospital for now. Will ask IR to place tunneled HD cath on Friday. Pt's nephrologist's in [facility] were contacted today and are aware pt will need an outpatient dialysis spot (Dr Louis Meckel is primary neph in [facility]), they will contact nephrology here when spot is available 4. 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 HTN/volume- not on BP meds at home, euvolemic now   [name]  MD [phone] pgr    [phone] cell 07/11/2012, 10:54 AM

## 2012-07-11 NOTE — Progress Notes (Signed)
Rehab admissions - Evaluated for possible admission.  Please see rehab consult done by Dr. Riley Kill recommending home with [facility] therapies once medically stable for discharge.  Call me for questions.  #[phone]

## 2012-07-11 NOTE — Progress Notes (Signed)
PT does not want to wear a BIPAP. He said is is happy with the [place] and his sats are looking fine on that

## 2012-07-11 NOTE — Progress Notes (Signed)
[facility]/Ailanie Ruttan Progress Note 3 Days Post-Op  Subjective: Patient doing much better  Objective: Vital signs in last 24 hours: Temp:  [97.4 F (36.3 C)-98.2 F (36.8 C)] 98.1 F (36.7 C) (04/23 0400) Pulse Rate:  [76-102] 76 (04/23 0758) Resp:  [18-32] 24 (04/23 0758) BP: (92-153)/(51-109) 109/79 mmHg (04/23 0705) SpO2:  [68 %-100 %] 99 % (04/23 0758) FiO2 (%):  [50 %-100 %] 100 % (04/22 2200) Weight:  [137.4 kg (302 lb 14.6 oz)-141.7 kg (312 lb 6.3 oz)] 137.4 kg (302 lb 14.6 oz) (04/23 0700) Last BM Date: 07/10/12  Intake/Output from previous day: 04/22 0701 - 04/23 0700 In: 380 [P.O.:180; IV Piggyback:200] Out: 5004  Intake/Output this shift:    General: No distress  Lungs: Clear  Abd: Much better.  Dressing changes once a day.  Extremities: No changes  Neuro: Intact  Lab Results:  @LABLAST2 (wbc:2,hgb:2,hct:2,plt:2) BMET  Recent Labs  07/10/12 0333 07/11/12 0350  NA 141 140  K 4.8 4.4  CL 100 99  CO2 22 28  GLUCOSE 124* 138*  BUN 59* 46*  CREATININE 13.98* 9.47*  CALCIUM 9.2 9.3   PT/INR No results found for this basename: LABPROT, INR,  in the last 72 hours ABG  Recent Labs  07/08/12 1228 07/08/12 1613  PHART 7.208* 7.303*  HCO3 27.6* 25.2*    Studies/Results: Dg Chest Port 1 View  07/10/2012  *RADIOLOGY REPORT*  Clinical Data: Post extubation, stridor  PORTABLE CHEST - 1 VIEW  Comparison: Portable exam 0801 hours compared to 07/09/2012  Findings: Right jugular central venous catheter stable tip projecting over SVC. Nasogastric and endotracheal tubes no longer identified. Enlargement of cardiac silhouette. Persistent elevation of right diaphragm with right basilar atelectasis. Question minimal perihilar infiltrate versus edema. No gross segmental consolidation, pleural effusion or pneumothorax.  IMPRESSION: Mild right basilar atelectasis. Enlargement of cardiac silhouette with question residual perihilar edema or infiltrate.   Original Report  Authenticated By: Ulyses Southward, M.D.     Anti-infectives: Anti-infectives   Start     Dose/Rate Route Frequency Ordered Stop   07/11/12 2200  fluconazole (DIFLUCAN) IVPB 200 mg     200 mg 100 mL/hr over 60 Minutes Intravenous Every 24 hours 07/10/12 1350 08/05/12 2159   07/10/12 1400  fluconazole (DIFLUCAN) IVPB 400 mg     400 mg 100 mL/hr over 120 Minutes Intravenous  Once 07/10/12 1349 07/10/12 1709   07/07/12 2200  vancomycin (VANCOCIN) 1,250 mg in sodium chloride 0.9 % 250 mL IVPB  Status:  Discontinued     1,250 mg 250 mL/hr over 60 Minutes Intravenous  Once 07/07/12 2158 07/07/12 2206   07/06/12 1545  micafungin (MYCAMINE) 100 mg in sodium chloride 0.9 % 100 mL IVPB  Status:  Discontinued     100 mg 100 mL/hr over 1 Hours Intravenous Daily 07/06/12 1541 07/10/12 1123   07/05/12 1800  vancomycin (VANCOCIN) IVPB 750 mg/150 ml premix  Status:  Discontinued     750 mg 150 mL/hr over 60 Minutes Intravenous  Once 07/05/12 1352 07/05/12 1355   07/05/12 1600  vancomycin (VANCOCIN) IVPB 750 mg/150 ml premix     750 mg 150 mL/hr over 60 Minutes Intravenous  Once 07/05/12 1355 07/05/12 1956   07/04/12 2100  piperacillin-tazobactam (ZOSYN) IVPB 2.25 g  Status:  Discontinued     2.25 g 100 mL/hr over 30 Minutes Intravenous 3 times per day 07/04/12 1120 07/08/12 1103   07/02/12 2200  piperacillin-tazobactam (ZOSYN) IVPB 3.375 g  Status:  Discontinued  3.375 g 12.5 mL/hr over 240 Minutes Intravenous Every 6 hours 07/02/12 1703 07/04/12 1120   07/02/12 2000  vancomycin (VANCOCIN) 1,500 mg in sodium chloride 0.9 % 500 mL IVPB  Status:  Discontinued     1,500 mg 250 mL/hr over 120 Minutes Intravenous Every 24 hours 07/02/12 1708 07/03/12 1245   07/02/12 0800  piperacillin-tazobactam (ZOSYN) IVPB 2.25 g  Status:  Discontinued     2.25 g 100 mL/hr over 30 Minutes Intravenous Every 8 hours 07/02/12 0054 07/02/12 1703   07/02/12 0100  vancomycin (VANCOCIN) 2,500 mg in sodium chloride 0.9 % 500  mL IVPB     2,500 mg 250 mL/hr over 120 Minutes Intravenous Every 12 hours 07/02/12 0054 07/02/12 0409   07/02/12 0100  piperacillin-tazobactam (ZOSYN) IVPB 2.25 g     2.25 g 100 mL/hr over 30 Minutes Intravenous  Once 07/02/12 0054 07/02/12 0519   07/01/12 2245  cefTRIAXone (ROCEPHIN) 1 g in dextrose 5 % 50 mL IVPB     1 g 100 mL/hr over 30 Minutes Intravenous  Once 07/01/12 2236 07/01/12 2336   07/01/12 2245  azithromycin (ZITHROMAX) 500 mg in dextrose 5 % 250 mL IVPB  Status:  Discontinued     500 mg 250 mL/hr over 60 Minutes Intravenous  Once 07/01/12 2236 07/02/12 0218      Assessment/Plan: s/p Procedure(s): MINOR REMOVAL OF PERITONEAL DIALYSIS CATHETER Advance diet Doing well.  Converted to hemodialysis.  We will sign off.  LOS: 10 days   Marta Lamas. Gae Bon, MD, FACS [phone] [facility] 07/11/2012

## 2012-07-11 NOTE — Progress Notes (Signed)
Physical Therapy Treatment Patient Details Name: Gary Rivera MRN: [id number] DOB: 14-Apr-1960 Today's Date: 07/11/2012 Time: 4098-1191 PT Time Calculation (min): 25 min  PT Assessment / Plan / Recommendation Comments on Treatment Session  Pt con't to be limited by SOB and onset of fatigue.   Pt was able to ambulate today with desat to 86%.  Progressing although slowly. May need CIR stay prior to d/c home.      Follow Up Recommendations  CIR;Supervision/Assistance - 24 hour                 Equipment Recommendations  Rolling walker with 5" wheels    Recommendations for Other Services Rehab consult  Frequency Min 3X/week   Plan Discharge plan needs to be updated;Frequency remains appropriate    Precautions / Restrictions Precautions Precautions: Fall Precaution Comments: 3.5 LO2 on arrival.  Ambulated on 4L O2.  Sats stay above 90% on 4L with pursed lip breathing Restrictions Weight Bearing Restrictions: No   Pertinent Vitals/Pain See transfer comment below for sats with ambulation.  Other VSS, No paiin    Mobility  Bed Mobility Bed Mobility: Not assessed Transfers Transfers: Sit to Stand;Stand to Sit Sit to Stand: 4: Min assist;With upper extremity assist;From chair/3-in-1;With armrests Stand to Sit: 4: Min assist;With upper extremity assist;To chair/3-in-1;With armrests Stand Pivot Transfers: Not tested (comment) Details for Transfer Assistance: min with rocking technique from lower surface height, minguard A from elevated surface height (pillows placed in chair to elevate surface height).  Pt having difficulty breathing needing standing rest breaks with pursed lip breathing practice to keep sats up >90%.  Desat to 86 % on 4L with ambulation which improved when cued to take standing rest break and pursed lip breathe. .   Ambulation/Gait Ambulation/Gait Assistance: 4: Min guard Ambulation Distance (Feet): 350 Feet Assistive device: Rolling walker Ambulation/Gait  Assistance Details: See comments under transfers re: O2 sats.  No knee buclking noted.  Steady throughout.   Gait Pattern: Step-through pattern;Trunk flexed Gait velocity: slow Stairs: No Wheelchair Mobility Wheelchair Mobility: No    PT Goals Acute Rehab PT Goals PT Goal: Sit to Stand - Progress: Progressing toward goal PT Goal: Stand to Sit - Progress: Progressing toward goal PT Goal: Ambulate - Progress: Progressing toward goal  Visit Information  Last PT Received On: 07/11/12 Assistance Needed: +2    Subjective Data  Subjective: "I want to walk the unit."   Cognition  Cognition Arousal/Alertness: Awake/alert Behavior During Therapy: WFL for tasks assessed/performed Overall Cognitive Status: Within Functional Limits for tasks assessed    Balance  Static Sitting Balance Static Sitting - Level of Assistance: Not tested (comment) Static Standing Balance Static Standing - Balance Support: Bilateral upper extremity supported;During functional activity Static Standing - Level of Assistance: 5: Stand by assistance Static Standing - Comment/# of Minutes: 3  End of Session PT - End of Session Equipment Utilized During Treatment: Gait belt;Oxygen Activity Tolerance: Patient limited by fatigue Patient left: in chair;with call bell/phone within reach Nurse Communication: Mobility status       INGOLD,Lorin Gawron 07/11/2012, 1:49 PM  [facility] [phone] (pager)

## 2012-07-12 LAB — CBC
MCHC: 31.2 g/dL (ref 30.0–36.0)
RDW: 17.8 % — ABNORMAL HIGH (ref 11.5–15.5)

## 2012-07-12 LAB — RENAL FUNCTION PANEL
Albumin: 2.4 g/dL — ABNORMAL LOW (ref 3.5–5.2)
BUN: 69 mg/dL — ABNORMAL HIGH (ref 6–23)
GFR calc Af Amer: 5 mL/min — ABNORMAL LOW (ref >90)
Phosphorus: 9.1 mg/dL — ABNORMAL HIGH (ref 2.3–4.6)
Potassium: 3.5 mEq/L (ref 3.5–5.1)
Sodium: 142 mEq/L (ref 135–145)

## 2012-07-12 LAB — GLUCOSE, CAPILLARY: Glucose-Capillary: 120 mg/dL — ABNORMAL HIGH (ref 70–99)

## 2012-07-12 MED ORDER — NEPRO/CARBSTEADY PO LIQD: 237.0000 mL | Freq: Three times a day (TID) | ORAL | Status: DC | PRN

## 2012-07-12 MED ORDER — ONDANSETRON HCL 4 MG PO TABS: 4.0000 mg | ORAL_TABLET | Freq: Four times a day (QID) | ORAL | Status: DC | PRN

## 2012-07-12 MED ORDER — CALCIUM CARBONATE 1250 MG/5ML PO SUSP: 500.0000 mg | Freq: Four times a day (QID) | ORAL | Status: DC | PRN

## 2012-07-12 MED ORDER — DARBEPOETIN ALFA-POLYSORBATE 200 MCG/0.4ML IJ SOLN
INTRAMUSCULAR | Status: AC
  Administered 2012-07-12: 200 ug via INTRAVENOUS
  Filled 2012-07-12: qty 0.4

## 2012-07-12 MED ORDER — SORBITOL 70 % SOLN: 30.0000 mL | Status: DC | PRN

## 2012-07-12 MED ORDER — ONDANSETRON HCL 4 MG/2ML IJ SOLN: 4.0000 mg | Freq: Four times a day (QID) | INTRAMUSCULAR | Status: DC | PRN

## 2012-07-12 MED ORDER — ACETAMINOPHEN 325 MG PO TABS
ORAL_TABLET | ORAL | Status: AC
  Filled 2012-07-12: qty 2

## 2012-07-12 MED ORDER — ZOLPIDEM TARTRATE 5 MG PO TABS: 5.0000 mg | ORAL_TABLET | Freq: Every evening | ORAL | Status: DC | PRN

## 2012-07-12 MED ORDER — OXYCODONE-ACETAMINOPHEN 5-325 MG PO TABS
ORAL_TABLET | ORAL | Status: AC
  Filled 2012-07-12: qty 1

## 2012-07-12 MED ORDER — ACETAMINOPHEN 325 MG PO TABS
650.0000 mg | ORAL_TABLET | Freq: Four times a day (QID) | ORAL | Status: DC | PRN
  Administered 2012-07-12: 650 mg via ORAL

## 2012-07-12 MED ORDER — DOCUSATE SODIUM 283 MG RE ENEM: 1.0000 | ENEMA | RECTAL | Status: DC | PRN

## 2012-07-12 MED ORDER — DARBEPOETIN ALFA-POLYSORBATE 200 MCG/0.4ML IJ SOLN
200.0000 ug | INTRAMUSCULAR | Status: DC
  Administered 2012-07-16: 200 ug via INTRAVENOUS
  Filled 2012-07-12: qty 0.4

## 2012-07-12 MED ORDER — HYDROXYZINE HCL 25 MG PO TABS
25.0000 mg | ORAL_TABLET | Freq: Three times a day (TID) | ORAL | Status: DC | PRN
  Filled 2012-07-12: qty 1

## 2012-07-12 MED ORDER — ACETAMINOPHEN 650 MG RE SUPP: 650.0000 mg | Freq: Four times a day (QID) | RECTAL | Status: DC | PRN

## 2012-07-12 MED ORDER — CAMPHOR-MENTHOL 0.5-0.5 % EX LOTN
1.0000 "application " | TOPICAL_LOTION | Freq: Three times a day (TID) | CUTANEOUS | Status: DC | PRN
  Filled 2012-07-12: qty 222

## 2012-07-12 MED ORDER — TUBERCULIN PPD 5 UNIT/0.1ML ID SOLN
5.0000 [IU] | Freq: Once | INTRADERMAL | Status: AC
  Administered 2012-07-12: 5 [IU] via INTRADERMAL
  Filled 2012-07-12 (×3): qty 0.1

## 2012-07-12 NOTE — Progress Notes (Signed)
[facility] PROGRESS NOTE  Rivera DEGOLLADO YNW:[id number] DOB: March 05, 1961 DOA: 07/01/2012 PCP: [name], MD  Assessment/Plan: 1. Septic shock -resolved -Fungal peritonitis:  -s/p PD cath removal 4/20 - on fluconazole now per ID recs  2. ESRD: on HD in the inerim now -Tunneled HD catheter per IR tomorrow -Renal contacted primary nephrologist in [place] yesterday -PPD placed today  3. Anemia of chronic disease and acute illness -s/p I unit PRBC on 4/18 -Aranesp per renal  4. HTN: stable  5. DM: - stable, SSI  DVt proph: on heparin SQ  Code Status: FULL Family Communication:  Disposition Plan: home in 24-48 hours with [facility] services once outpatient HD set up   Consultants:  PCCM  Renal  ID   Procedures: ETT 4/13 >>4/15  L IJ CVL 4/14 >>  R IJ HD cath >>  ETT 4/21>>4/21  ANTIBIOTICS:  ABX per ID  4/13 rocephin >>>4/13  4/13 azithromax >>>4/13  4/13 IV Vanc >>>4/20  4/13 IV Zosyn >>>4/20  mycofungin 4/18>>>4/22  Diflucan 4/22   HPI/Subjective: Feel well, doing better  Objective: Filed Vitals:   07/12/12 1054 07/12/12 1100 07/12/12 1453 07/12/12 1544  BP: 125/76 119/67 101/58   Pulse: 83 93 86   Temp: 98.1 F (36.7 C) 98.3 F (36.8 C) 98.6 F (37 C)   TempSrc: Oral Oral Oral   Resp: 18 18 18    Height:      Weight: 134.5 kg (296 lb 8.3 oz)     SpO2: 92% 92% 90% 93%    Intake/Output Summary (Last 24 hours) at 07/12/12 1556 Last data filed at 07/12/12 1300  Gross per 24 hour  Intake   1160 ml  Output   2248 ml  Net  -1088 ml   Filed Weights   07/11/12 1941 07/12/12 0637 07/12/12 1054  Weight: 136 kg (299 lb 13.2 oz) 136.4 kg (300 lb 11.3 oz) 134.5 kg (296 lb 8.3 oz)    Exam:   General:  AAOx3  Cardiovascular: S1S2/RRR  Respiratory: Diminished at bases  Abdomen: soft, obese, NT, BS present  Ext: trace edema  Data Reviewed: Basic Metabolic Panel:  Recent Labs Lab 07/08/12 1227  07/09/12 0400 07/09/12 1100  07/10/12 0333 07/11/12 0350 07/12/12 0642  NA 140  < > 141 143 141 140 142  K 4.2  < > 3.7 4.0 4.8 4.4 3.5  CL 99  < > 100 102 100 99 99  CO2 26  < > 24 25 22 28 25   GLUCOSE 94  < > 78 91 124* 138* 96  BUN 38*  < > 48* 51* 59* 46* 69*  CREATININE 10.17*  < > 11.39* 12.44* 13.98* 9.47* 12.43*  CALCIUM 9.1  < > 9.2 9.3 9.2 9.3 8.8  PHOS 6.9*  --  5.1* 7.2*  --  6.9* 9.1*  < > = values in this interval not displayed. Liver Function Tests:  Recent Labs Lab 07/08/12 1227 07/09/12 0400 07/09/12 1100 07/11/12 0350 07/12/12 0642  ALBUMIN 2.3* 2.4* 2.4* 2.6* 2.4*   No results found for this basename: LIPASE, AMYLASE,  in the last 168 hours No results found for this basename: AMMONIA,  in the last 168 hours CBC:  Recent Labs Lab 07/06/12 0440 07/08/12 0758 07/09/12 0400 07/10/12 0333 07/12/12 0642  WBC 11.7* 11.3* 9.0 10.6* 9.2  HGB 7.2* 8.5* 7.6* 7.6* 7.7*  HCT 23.2* 27.4* 23.3* 24.2* 24.7*  MCV 96.3 92.3 89.6 90.6 90.5  PLT 150 163 151 176 215  Cardiac Enzymes:  Recent Labs Lab 07/08/12 1228  TROPONINI <0.30   BNP (last 3 results)  Recent Labs  09/07/11 2000 06/19/12 1927  PROBNP 24858.0* 3703.0*   CBG:  Recent Labs Lab 07/11/12 0354 07/11/12 0719 07/11/12 1107 07/11/12 1648 07/12/12 1216  GLUCAP 133* 135* 133* 115* 120*    Recent Results (from the past 240 hour(s))  FUNGUS CULTURE W SMEAR     Status: None   Collection Time    07/08/12  8:30 AM      Result Value Range Status   Specimen Description CATH TIP   Final   Special Requests POF ZOSYN   Final   Fungal Smear FEW YEAST   Final   Culture CULTURE IN PROGRESS FOR FOUR WEEKS   Final   Report Status PENDING   Incomplete  CATH TIP CULTURE     Status: None   Collection Time    07/08/12  8:30 AM      Result Value Range Status   Specimen Description CATH TIP   Final   Special Requests POF ZOSYN   Final   Culture >100 COLONIES YEAST, NOT CANDIDA ALBICANS   Final   Report Status 07/11/2012  FINAL   Final  MRSA PCR SCREENING     Status: Abnormal   Collection Time    07/08/12 11:01 AM      Result Value Range Status   MRSA by PCR INVALID RESULTS, SPECIMEN SENT FOR CULTURE (*) NEGATIVE Final   Comment:            The GeneXpert MRSA Assay (FDA     approved for NASAL specimens     only), is one component of a     comprehensive MRSA colonization     surveillance program. It is not     intended to diagnose MRSA     infection nor to guide or     monitor treatment for     MRSA infections.  MRSA CULTURE     Status: None   Collection Time    07/08/12 11:01 AM      Result Value Range Status   Specimen Description NASAL SWAB   Final   Special Requests NONE MRSA PCR WAS INVALID TWICE   Final   Culture     Final   Value: NO STAPHYLOCOCCUS AUREUS ISOLATED     Note: NOMRSA   Report Status 07/10/2012 FINAL   Final     Studies: Dg Chest Port 1 View  07/11/2012  *RADIOLOGY REPORT*  Clinical Data: Extubation  PORTABLE CHEST - 1 VIEW  Comparison: 07/10/2012  Findings: Low lung volumes.  Mild bibasilar atelectasis.  No pleural effusion or pneumothorax.  Stable right IJ venous catheter terminates in the lower SVC.  Mild cardiomegaly.  IMPRESSION: Low lung volumes with bibasilar atelectasis.   Original Report Authenticated By: Charline Bills, M.D.     Scheduled Meds: . acetaminophen      . alteplase  2 mg Intracatheter Once  . aspirin  325 mg Oral Daily  . bisoprolol  2.5 mg Oral Daily  . calcitRIOL  0.25 mcg Oral Custom  . darbepoetin (ARANESP) injection - DIALYSIS  200 mcg Intravenous Q Thu-HD  . feeding supplement  1 Container Oral TID BM  . fluconazole  200 mg Oral Daily  . insulin aspart  0-9 Units Subcutaneous TID WC  . lanthanum  1,000 mg Oral TID WC  . oxyCODONE-acetaminophen      . pantoprazole  40 mg Oral QHS  .  tuberculin  5 Units Intradermal Once   Continuous Infusions:   Principal Problem:   Acute respiratory failure with hypercapnia Active Problems:   Systolic  congestive heart failure with reduced left ventricular function, NYHA class 1- EF 40-45% 2011 ECHO   CKD (chronic kidney disease) requiring chronic PERITONEAL dialysis   HTN (hypertension)   Hypercapnia   Pulmonary edema   Obesity hypoventilation syndrome   Septic shock(785.52)   Peritoneal dialysis catheter exit site infection    Time spent:    [facility]  [facility] Pager [phone]. If 7PM-7AM, please contact night-coverage at [email or web address], password [facility] 07/12/2012, 3:56 PM  LOS: 11 days

## 2012-07-12 NOTE — Progress Notes (Signed)
Patient ID: Gary Rivera, male   DOB: 10/31/1960, 52 y.o.   MRN: [id number]  [facility] Progress Note    Subjective:   Reports to be feeling better with minimal abdominal discomfort- tolerating HD well   Objective:   BP 87/74  Pulse 83  Temp(Src) 97.6 F (36.4 C) (Oral)  Resp 19  Ht 6\' 2"  (1.88 m)  Wt 136.4 kg (300 lb 11.3 oz)  BMI 38.59 kg/m2  SpO2 93%  Physical Exam: WUJ:[id number] on HD AOZ:HYQMV RRR, normal S1 and S2  Resp:CTA bilaterally, no rales/rhonchi HQI:ONGE, obese, mildly tender- BS normal Ext:No palpable LE edema  Labs: BMET  Recent Labs Lab 07/08/12 1227 07/08/12 1228 07/09/12 0400 07/09/12 1100 07/10/12 0333 07/11/12 0350 07/12/12 0642  NA 140 139 141 143 141 140 142  K 4.2 4.2 3.7 4.0 4.8 4.4 3.5  CL 99 99 100 102 100 99 99  CO2 26 26 24 25 22 28 25   GLUCOSE 94 92 78 91 124* 138* 96  BUN 38* 38* 48* 51* 59* 46* 69*  CREATININE 10.17* 9.87* 11.39* 12.44* 13.98* 9.47* 12.43*  CALCIUM 9.1 8.9 9.2 9.3 9.2 9.3 8.8  PHOS 6.9*  --  5.1* 7.2*  --  6.9* 9.1*   CBC  Recent Labs Lab 07/08/12 0758 07/09/12 0400 07/10/12 0333 07/12/12 0642  WBC 11.3* 9.0 10.6* 9.2  HGB 8.5* 7.6* 7.6* 7.7*  HCT 27.4* 23.3* 24.2* 24.7*  MCV 92.3 89.6 90.6 90.5  PLT 163 151 176 215    Medications:    . acetaminophen      . alteplase  2 mg Intracatheter Once  . aspirin  325 mg Oral Daily  . bisoprolol  2.5 mg Oral Daily  . calcitRIOL  0.25 mcg Oral Custom  . darbepoetin (ARANESP) injection - DIALYSIS  200 mcg Intravenous Q Thu-HD  . feeding supplement  1 Container Oral TID BM  . fluconazole  200 mg Oral Daily  . insulin aspart  0-9 Units Subcutaneous TID WC  . lanthanum  1,000 mg Oral TID WC  . pantoprazole  40 mg Oral QHS     Assessment/ Plan:   1. Septic shock / fungal peritonitis, s/p PD cath removal 4/20- on fluconazole now. Clinically Improved with resolution of markers of sepsis (ALI) 2. ESRD, transitioned to HD from PD (because  of #1). HD TTS in hospital for now. IR to place tunneled HD cath on Friday. His primary nephrologist on [place] (Dr.Stovall) was contacted yesterday for OP placement help and will contact us back with updates 3. 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 HTN/volume- not on BP meds at home, euvolemic now    Zetta Bills, MD 07/12/2012, 8:34 AM

## 2012-07-12 NOTE — Procedures (Signed)
Patient seen on Hemodialysis. QB 400, UF goal 3.6L, BP 130/67 Treatment adjusted as needed.  Zetta Bills MD [facility]. Office # [phone] Pager # [phone] 8:47 AM

## 2012-07-12 NOTE — Progress Notes (Signed)
Late entry; pt transferred to unit 4/23. Pt is AOX3  Having no signs of resp. Distress on oxygen. Pt amb with assistance with generalized weakness. Pt has dry sin and incision to abdomen, no other skin issues. Pt oriented to unit, staff, and safety measures. Will continue to monitor.

## 2012-07-13 ENCOUNTER — Inpatient Hospital Stay (HOSPITAL_COMMUNITY): Payer: Medicare Other

## 2012-07-13 ENCOUNTER — Encounter (HOSPITAL_COMMUNITY): Payer: Self-pay | Admitting: Radiology

## 2012-07-13 MED ORDER — CEFAZOLIN SODIUM-DEXTROSE 2-3 GM-% IV SOLR
2.0000 g | INTRAVENOUS | Status: AC
  Administered 2012-07-13: 2 g via INTRAVENOUS
  Filled 2012-07-13: qty 50

## 2012-07-13 MED ORDER — ENOXAPARIN SODIUM 30 MG/0.3ML ~~LOC~~ SOLN
30.0000 mg | SUBCUTANEOUS | Status: DC
  Administered 2012-07-14 – 2012-07-16 (×3): 30 mg via SUBCUTANEOUS
  Filled 2012-07-13 (×4): qty 0.3

## 2012-07-13 MED ORDER — FENTANYL CITRATE 0.05 MG/ML IJ SOLN
INTRAMUSCULAR | Status: AC | PRN
  Administered 2012-07-13: 50 ug via INTRAVENOUS

## 2012-07-13 MED ORDER — HEPARIN SODIUM (PORCINE) 1000 UNIT/ML DIALYSIS
5000.0000 [IU] | INTRAMUSCULAR | Status: DC | PRN
  Filled 2012-07-13: qty 5

## 2012-07-13 MED ORDER — MIDAZOLAM HCL 2 MG/2ML IJ SOLN
INTRAMUSCULAR | Status: AC | PRN
  Administered 2012-07-13 (×2): 1 mg via INTRAVENOUS

## 2012-07-13 NOTE — Progress Notes (Signed)
PT Cancellation Note  Patient Details Name: BRAYLN DUQUE MRN: [id number] DOB: March 08, 1961   Cancelled Treatment:    Reason Eval/Treat Not Completed: Other (comment) (Pt refused to participate.) Pt stated that he will be going to surgery for permanent catheter placement and declined PT.     Ronit Marczak 07/13/2012, 2:55 PM Franz Svec L. Emillia Weatherly DPT [phone].

## 2012-07-13 NOTE — Plan of Care (Signed)
Problem: Food- and Nutrition-Related Knowledge Deficit (NB-1.1) Goal: Nutrition education Formal process to instruct or train a patient/client in a skill or to impart knowledge to help patients/clients voluntarily manage or modify food choices and eating behavior to maintain or improve health. Outcome: Completed/Met Date Met:  07/13/12 Nutrition Education Note  RD consulted for Renal Education. Provided Choose-A-Meal Booklet to patient/family. Reviewed food groups and provided written recommended serving sizes specifically determined for patient's current nutritional status.   Explained why diet restrictions are needed and provided lists of foods to limit/avoid that are high potassium, sodium, and phosphorus. Provided specific recommendations on safer alternatives of these foods. Strongly encouraged compliance of this diet.   Discussed importance of protein intake at each meal and snack. Provided examples of how to maximize protein intake throughout the day. Discussed need for fluid restriction with dialysis, importance of minimizing weight gain between HD treatments, and renal-friendly beverage options.  Encouraged pt to discuss specific diet questions/concerns with RD at HD outpatient facility. Teach back method used.  Pt was previously on PD, was familiar with some of the information but needed a refresher on how much of foods to eat.   Expect good compliance.   Pt continues to be followed by RD. Pt requesting snacks be added as he does not like the meals, "bland" and is only eating about 50% of meals. RD will add snacks per pt preference.     Clarene Duke RD, LDN Pager [phone] After Hours pager [phone]

## 2012-07-13 NOTE — H&P (Signed)
HPI: Gary Rivera is an 52 y.o. male with ESRD who was previously on PD. Developed fungal peritonitis and had to have PD cath removed about 5 days ago. Has been doing well and is getting HD via temp cath. He now needs tunneled HD cath. Chart, PMHx, and meds reviewed.  Past Medical History:  Past Medical History  Diagnosis Date  . Hypertension   . Renal disorder   . CHF (congestive heart failure)   . Peritoneal dialysis catheter in place   . Anemia     patient reporats that last hgb 7.9 a week ago  . Anxiety state, unspecified     Past Surgical History:  Past Surgical History  Procedure Laterality Date  . Appendectomy    . Peritoneal catheter insertion      Family History:  Family History  Problem Relation Age of Onset  . Diabetes Mother   . Hypertension Mother   . Hypertension Sister   . Asthma Sister     Social History:  reports that he has never smoked. He has never used smokeless tobacco. He reports that he does not drink alcohol or use illicit drugs.  Allergies:  Allergies  Allergen Reactions  . Renvela (Sevelamer) Nausea And Vomiting    Medications: Medications Prior to Admission  Medication Sig Dispense Refill  . albuterol (PROVENTIL HFA;VENTOLIN HFA) 108 (90 BASE) MCG/ACT inhaler Inhale 2 puffs into the lungs every 6 (six) hours as needed for wheezing.  1 Inhaler  2  . aspirin EC 81 MG tablet Take 81 mg by mouth daily.      Marland Kitchen b complex-vitamin c-folic acid (NEPHRO-VITE) 0.8 MG TABS Take 0.8 mg by mouth daily.      . bisoprolol (ZEBETA) 5 MG tablet Take 2.5 mg by mouth daily.      . calcitRIOL (ROCALTROL) 0.25 MCG capsule Take 0.25 mcg by mouth See admin instructions. Takes 1 capsule daily Monday through friday      . clonazePAM (KLONOPIN) 0.5 MG tablet Take 0.5 mg by mouth 2 (two) times daily as needed. anxiety      . famotidine (PEPCID) 20 MG tablet Take 20 mg by mouth at bedtime as needed for heartburn.      . lanthanum (FOSRENOL) 1000 MG chewable  tablet Chew 1,000 mg by mouth 3 (three) times daily with meals.      . Multiple Vitamin (MULTIVITAMIN) tablet Take 1 tablet by mouth daily.      Marland Kitchen omeprazole (PRILOSEC) 20 MG capsule Take 20 mg by mouth daily.      . vitamin B-12 (CYANOCOBALAMIN) 100 MCG tablet Take 50 mcg by mouth daily.        Please HPI for pertinent positives, otherwise complete 10 system ROS negative.  Physical Exam: Blood pressure 103/52, pulse 82, temperature 98.7 F (37.1 C), temperature source Oral, resp. rate 18, height 6\' 2"  (1.88 m), weight 296 lb 12.8 oz (134.628 kg), SpO2 98.00%. Body mass index is 38.09 kg/(m^2).   General Appearance:  Alert, cooperative, no distress, appears stated age  Head:  Normocephalic, without obvious abnormality, atraumatic  ENT: Unremarkable  Neck: Supple, symmetrical, trachea midline, right IJ temp cath intact  Lungs:   Clear to auscultation bilaterally, no w/r/r, respirations unlabored without use of accessory muscles.  Chest Wall:  No tenderness or deformity  Heart:  Regular rate and rhythm, S1, S2 normal, no murmur, rub or gallop.  Abdomen:   Soft, non-tender, non distended. Bowel sounds active all four quadrants,  no masses,  no organomegaly.  Neurologic: Normal affect, no gross deficits.   Results for orders placed during the hospital encounter of 07/01/12 (from the past 48 hour(s))  GLUCOSE, CAPILLARY     Status: Abnormal   Collection Time    07/11/12 11:07 AM      Result Value Range   Glucose-Capillary 133 (*) 70 - 99 mg/dL  GLUCOSE, CAPILLARY     Status: Abnormal   Collection Time    07/11/12  4:48 PM      Result Value Range   Glucose-Capillary 115 (*) 70 - 99 mg/dL  CBC     Status: Abnormal   Collection Time    07/12/12  6:42 AM      Result Value Range   WBC 9.2  4.0 - 10.5 K/uL   RBC 2.73 (*) 4.22 - 5.81 MIL/uL   Hemoglobin 7.7 (*) 13.0 - 17.0 g/dL   HCT 16.1 (*) 09.6 - 04.5 %   MCV 90.5  78.0 - 100.0 fL   MCH 28.2  26.0 - 34.0 pg   MCHC 31.2  30.0 - 36.0  g/dL   RDW 40.9 (*) 81.1 - 91.4 %   Platelets 215  150 - 400 K/uL  RENAL FUNCTION PANEL     Status: Abnormal   Collection Time    07/12/12  6:42 AM      Result Value Range   Sodium 142  135 - 145 mEq/L   Potassium 3.5  3.5 - 5.1 mEq/L   Chloride 99  96 - 112 mEq/L   CO2 25  19 - 32 mEq/L   Glucose, Bld 96  70 - 99 mg/dL   BUN 69 (*) 6 - 23 mg/dL   Creatinine, Ser 78.29 (*) 0.50 - 1.35 mg/dL   Calcium 8.8  8.4 - 56.2 mg/dL   Phosphorus 9.1 (*) 2.3 - 4.6 mg/dL   Albumin 2.4 (*) 3.5 - 5.2 g/dL   GFR calc non Af Amer 4 (*) >90 mL/min   GFR calc Af Amer 5 (*) >90 mL/min   Comment:            The eGFR has been calculated     using the CKD EPI equation.     This calculation has not been     validated in all clinical     situations.     eGFR's persistently     <90 mL/min signify     possible Chronic Kidney Disease.  GLUCOSE, CAPILLARY     Status: Abnormal   Collection Time    07/12/12 12:16 PM      Result Value Range   Glucose-Capillary 120 (*) 70 - 99 mg/dL  GLUCOSE, CAPILLARY     Status: Abnormal   Collection Time    07/12/12  4:33 PM      Result Value Range   Glucose-Capillary 116 (*) 70 - 99 mg/dL  GLUCOSE, CAPILLARY     Status: Abnormal   Collection Time    07/12/12  9:14 PM      Result Value Range   Glucose-Capillary 112 (*) 70 - 99 mg/dL  GLUCOSE, CAPILLARY     Status: None   Collection Time    07/13/12  8:04 AM      Result Value Range   Glucose-Capillary 99  70 - 99 mg/dL     Assessment/Plan ESRD Needs tunneled HD catheter. Discussed procedure, risks, use of sedation. Holding Lovenox today. Last PT/INR from 4/1 was 13.6/1.0, not on Coumadin Has been NPO since  9am Consent signed in chart  Brayton El PA-C 07/13/2012, 9:59 AM

## 2012-07-13 NOTE — Progress Notes (Signed)
Utilization review completed.  

## 2012-07-13 NOTE — Progress Notes (Signed)
Patient said he hasnt worn for three days and feel like he would be okay at 3l. Will contiue to monitor the need for Bipap and was explained if he wanted to wear it all he had to do was call. RN aware

## 2012-07-13 NOTE — Progress Notes (Signed)
Patient ID: Gary Rivera, male   DOB: 1961-03-08, 52 y.o.   MRN: [id number]  [facility] Progress Note    Subjective:   Reports to be tired this morning after poor sleep last night. Had breakfast this morning as he was not formally on the schedule for tunneled dialysis catheter placement. Seen earlier today by IR.    Objective:   BP 112/58  Pulse 80  Temp(Src) 93.3 F (34.1 C) (Oral)  Resp 17  Ht 6\' 2"  (1.88 m)  Wt 134.628 kg (296 lb 12.8 oz)  BMI 38.09 kg/m2  SpO2 86%  Physical Exam: Gen: Comfortably resting in bed, no emerging complaints CVS: Pulse regular in rate and rhythm, heart sounds S1 and S2 normal Resp: Clear to auscultation bilaterally-no rales/rhonchi Abd: Soft, obese, mildly tender over the lower quadrants otherwise no mass/organomegaly Ext: No lower extremity edema  Labs: BMET  Recent Labs Lab 07/08/12 1227 07/08/12 1228 07/09/12 0400 07/09/12 1100 07/10/12 0333 07/11/12 0350 07/12/12 0642  NA 140 139 141 143 141 140 142  K 4.2 4.2 3.7 4.0 4.8 4.4 3.5  CL 99 99 100 102 100 99 99  CO2 26 26 24 25 22 28 25   GLUCOSE 94 92 78 91 124* 138* 96  BUN 38* 38* 48* 51* 59* 46* 69*  CREATININE 10.17* 9.87* 11.39* 12.44* 13.98* 9.47* 12.43*  CALCIUM 9.1 8.9 9.2 9.3 9.2 9.3 8.8  PHOS 6.9*  --  5.1* 7.2*  --  6.9* 9.1*   CBC  Recent Labs Lab 07/08/12 0758 07/09/12 0400 07/10/12 0333 07/12/12 0642  WBC 11.3* 9.0 10.6* 9.2  HGB 8.5* 7.6* 7.6* 7.7*  HCT 27.4* 23.3* 24.2* 24.7*  MCV 92.3 89.6 90.6 90.5  PLT 163 151 176 215   Medications:    . alteplase  2 mg Intracatheter Once  . aspirin  325 mg Oral Daily  . bisoprolol  2.5 mg Oral Daily  . calcitRIOL  0.25 mcg Oral Custom  .  ceFAZolin (ANCEF) IV  2 g Intravenous On Call  . darbepoetin (ARANESP) injection - DIALYSIS  200 mcg Intravenous Q Thu-HD  . enoxaparin (LOVENOX) injection  30 mg Subcutaneous Q24H  . feeding supplement  1 Container Oral TID BM  . fluconazole  200 mg Oral  Daily  . insulin aspart  0-9 Units Subcutaneous TID WC  . lanthanum  1,000 mg Oral TID WC  . pantoprazole  40 mg Oral QHS  . tuberculin  5 Units Intradermal Once     Assessment/ Plan:   1. Septic shock / fungal peritonitis, s/p PD cath removal 4/20- on fluconazole now. Clinically Improved with resolution of markers of sepsis (ALI) 2. ESRD, transitioned to HD from PD (because of #1). HD TTS in hospital for now-next scheduled hemodialysis will be tomorrow. IR to place tunneled HD cath on Friday. His primary nephrologist on [place] (Dr.Stovall) was contacted yesterday for OP placement help and will contact us back with updates 3. 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 HTN/volume- not on BP meds at home, euvolemic now    Zetta Bills, MD 07/13/2012, 11:14 AM

## 2012-07-13 NOTE — Procedures (Signed)
Successful placement of tunneled HD catheter with tips terminating within the superior aspect of the right atrium.   The patient tolerated the procedure well without immediate post procedural complication. The catheter is ready for immediate use.  

## 2012-07-13 NOTE — Progress Notes (Signed)
[facility] PROGRESS NOTE  Gary Rivera ZOX:[id number] DOB: [birth year] DOA: 07/01/2012 PCP: [name], MD  Assessment/Plan: 1. Septic shock - resolved - Fungal peritonitis:  - s/p PD cath removal 4/20, on HD in the interim - on fluconazole now per ID recs  2. ESRD: on HD in the inerim now -Tunneled HD catheter per IR today or tomorrow -Renal contacted primary nephrologist in [place] yesterday -PPD placed 4/24  3. Anemia of chronic disease and acute illness -s/p I unit PRBC on 4/18 -Aranesp per renal - CBC in 66m  4. HTN: stable  5. DM: - stable, SSI  DVt proph: on heparin SQ  Code Status: FULL Family Communication:  Disposition Plan: home in with [employer] services once outpatient HD set up   Consultants:  PCCM  Renal  ID   Procedures: ETT 4/13 >>4/15  L IJ CVL 4/14 >>  R IJ HD cath >>  ETT 4/21>>4/21  ANTIBIOTICS:  ABX per ID  4/13 rocephin >>>4/13  4/13 azithromax >>>4/13  4/13 IV Vanc >>>4/20  4/13 IV Zosyn >>>4/20  mycofungin 4/18>>>4/22  Diflucan 4/22   HPI/Subjective: Feel well, doing better  Objective: Filed Vitals:   07/12/12 1735 07/12/12 2115 07/13/12 0525 07/13/12 0920  BP: 104/49 108/68 103/52 112/58  Pulse: 85 89 82 80  Temp: 98.3 F (36.8 C) 99.4 F (37.4 C) 98.7 F (37.1 C) 93.3 F (34.1 C)  TempSrc: Oral Oral Oral Oral  Resp: 18 18 18 17   Height:      Weight:  134.628 kg (296 lb 12.8 oz)    SpO2: 92% 90% 98% 86%    Intake/Output Summary (Last 24 hours) at 07/13/12 1107 Last data filed at 07/13/12 0904  Gross per 24 hour  Intake    600 ml  Output      2 ml  Net    598 ml   Filed Weights   07/12/12 0637 07/12/12 1054 07/12/12 2115  Weight: 136.4 kg (300 lb 11.3 oz) 134.5 kg (296 lb 8.3 oz) 134.628 kg (296 lb 12.8 oz)    Exam:   General:  AAOx3  Cardiovascular: S1S2/RRR  Respiratory: Diminished at bases  Abdomen: soft, obese, NT, BS present  Ext: trace edema  Data Reviewed: Basic Metabolic  Panel:  Recent Labs Lab 07/08/12 1227  07/09/12 0400 07/09/12 1100 07/10/12 0333 07/11/12 0350 07/12/12 0642  NA 140  < > 141 143 141 140 142  K 4.2  < > 3.7 4.0 4.8 4.4 3.5  CL 99  < > 100 102 100 99 99  CO2 26  < > 24 25 22 28 25   GLUCOSE 94  < > 78 91 124* 138* 96  BUN 38*  < > 48* 51* 59* 46* 69*  CREATININE 10.17*  < > 11.39* 12.44* 13.98* 9.47* 12.43*  CALCIUM 9.1  < > 9.2 9.3 9.2 9.3 8.8  PHOS 6.9*  --  5.1* 7.2*  --  6.9* 9.1*  < > = values in this interval not displayed. Liver Function Tests:  Recent Labs Lab 07/08/12 1227 07/09/12 0400 07/09/12 1100 07/11/12 0350 07/12/12 0642  ALBUMIN 2.3* 2.4* 2.4* 2.6* 2.4*   No results found for this basename: LIPASE, AMYLASE,  in the last 168 hours No results found for this basename: AMMONIA,  in the last 168 hours CBC:  Recent Labs Lab 07/08/12 0758 07/09/12 0400 07/10/12 0333 07/12/12 0642  WBC 11.3* 9.0 10.6* 9.2  HGB 8.5* 7.6* 7.6* 7.7*  HCT 27.4* 23.3* 24.2* 24.7*  MCV 92.3 89.6 90.6 90.5  PLT 163 151 176 215   Cardiac Enzymes:  Recent Labs Lab 07/08/12 1228  TROPONINI <0.30   BNP (last 3 results)  Recent Labs  09/07/11 2000 06/19/12 1927  PROBNP 24858.0* 3703.0*   CBG:  Recent Labs Lab 07/11/12 1648 07/12/12 1216 07/12/12 1633 07/12/12 2114 07/13/12 0804  GLUCAP 115* 120* 116* 112* 99    Recent Results (from the past 240 hour(s))  FUNGUS CULTURE W SMEAR     Status: None   Collection Time    07/08/12  8:30 AM      Result Value Range Status   Specimen Description CATH TIP   Final   Special Requests POF ZOSYN   Final   Fungal Smear FEW YEAST   Final   Culture CULTURE IN PROGRESS FOR FOUR WEEKS   Final   Report Status PENDING   Incomplete  CATH TIP CULTURE     Status: None   Collection Time    07/08/12  8:30 AM      Result Value Range Status   Specimen Description CATH TIP   Final   Special Requests POF ZOSYN   Final   Culture >100 COLONIES YEAST, NOT CANDIDA ALBICANS   Final    Report Status 07/11/2012 FINAL   Final  MRSA PCR SCREENING     Status: Abnormal   Collection Time    07/08/12 11:01 AM      Result Value Range Status   MRSA by PCR INVALID RESULTS, SPECIMEN SENT FOR CULTURE (*) NEGATIVE Final   Comment:            The GeneXpert MRSA Assay (FDA     approved for NASAL specimens     only), is one component of a     comprehensive MRSA colonization     surveillance program. It is not     intended to diagnose MRSA     infection nor to guide or     monitor treatment for     MRSA infections.  MRSA CULTURE     Status: None   Collection Time    07/08/12 11:01 AM      Result Value Range Status   Specimen Description NASAL SWAB   Final   Special Requests NONE MRSA PCR WAS INVALID TWICE   Final   Culture     Final   Value: NO STAPHYLOCOCCUS AUREUS ISOLATED     Note: NOMRSA   Report Status 07/10/2012 FINAL   Final     Studies: No results found.  Scheduled Meds: . alteplase  2 mg Intracatheter Once  . aspirin  325 mg Oral Daily  . bisoprolol  2.5 mg Oral Daily  . calcitRIOL  0.25 mcg Oral Custom  .  ceFAZolin (ANCEF) IV  2 g Intravenous On Call  . darbepoetin (ARANESP) injection - DIALYSIS  200 mcg Intravenous Q Thu-HD  . enoxaparin (LOVENOX) injection  30 mg Subcutaneous Q24H  . feeding supplement  1 Container Oral TID BM  . fluconazole  200 mg Oral Daily  . insulin aspart  0-9 Units Subcutaneous TID WC  . lanthanum  1,000 mg Oral TID WC  . pantoprazole  40 mg Oral QHS  . tuberculin  5 Units Intradermal Once   Continuous Infusions:   Principal Problem:   Acute respiratory failure with hypercapnia Active Problems:   Systolic congestive heart failure with reduced left ventricular function, NYHA class 1- EF 40-45% 2011 ECHO   CKD (chronic kidney  disease) requiring chronic PERITONEAL dialysis   HTN (hypertension)   Hypercapnia   Pulmonary edema   Obesity hypoventilation syndrome   Septic shock(785.52)   Peritoneal dialysis catheter exit  site infection    Time spent:    [facility]  [facility] Pager [phone]. If 7PM-7AM, please contact night-coverage at [email or web address], password [facility] 07/13/2012, 11:07 AM  LOS: 12 days

## 2012-07-14 LAB — BASIC METABOLIC PANEL
BUN: 56 mg/dL — ABNORMAL HIGH (ref 6–23)
Calcium: 8.8 mg/dL (ref 8.4–10.5)
GFR calc non Af Amer: 4 mL/min — ABNORMAL LOW (ref >90)
Glucose, Bld: 100 mg/dL — ABNORMAL HIGH (ref 70–99)

## 2012-07-14 LAB — CBC
HCT: 26.3 % — ABNORMAL LOW (ref 39.0–52.0)
Hemoglobin: 8.1 g/dL — ABNORMAL LOW (ref 13.0–17.0)
MCH: 28 pg (ref 26.0–34.0)
MCHC: 30.8 g/dL (ref 30.0–36.0)

## 2012-07-14 MED ORDER — DOXERCALCIFEROL 4 MCG/2ML IV SOLN
2.0000 ug | INTRAVENOUS | Status: DC
  Filled 2012-07-14: qty 2

## 2012-07-14 MED ORDER — DOXERCALCIFEROL 4 MCG/2ML IV SOLN
INTRAVENOUS | Status: AC
  Administered 2012-07-14: 2 ug via INTRAVENOUS
  Filled 2012-07-14: qty 2

## 2012-07-14 NOTE — Progress Notes (Signed)
I have personally seen and examined this patient and agree with the assessment/plan as outlined above by Lyles PA. Awaiting placement to [facility] dialysis [facility] in [place]-apparently the center has a spot for him however he is awaiting evaluation by clinician to accept him. (Previously was seen by [facility] nephrology and needs to transfer care to [facility] or [facility] nephrology as the former practice does not go to [facility]). I discussed at length with several social workers/previous home therapy unit and informed me that the process is underway for him to be evaluated-possibly issue resolved by Monday. PPD to be read today. Lela Gell K.,MD 07/14/2012 10:48 AM

## 2012-07-14 NOTE — Progress Notes (Signed)
[facility] PROGRESS NOTE  Gary Rivera ZOX:[id number] DOB: 08-28-1960 DOA: 07/01/2012 PCP: [name], MD  Assessment/Plan: 1. Septic shock - resolved - Fungal peritonitis:  - s/p PD cath removal 4/20, on HD in the interim - on fluconazole now per ID recs for 22 more days - will ask micro to do sensitivities to Fluconazole and Voriconazole  2. ESRD: on HD in the inerim now -Tunneled HD catheter per IR 4/25 -Renal contacted primary nephrologist in [place] and awaiting acceptance to outpatient HD center -PPD placed 4/24  3. Anemia of chronic disease and acute illness -s/p I unit PRBC on 4/18 -Aranesp per renal - Hb stable  4. HTN:  - BP soft, but asymptomatic  5. DM: - stable, SSI  DVt proph: on heparin SQ  Code Status: FULL Family Communication:  Disposition Plan: home in with [facility] services once outpatient HD set up, Renal working on this   Consultants:  PCCM  Renal  ID   Procedures: ETT 4/13 >>4/15  L IJ CVL 4/14 >>  R IJ HD cath >>  ETT 4/21>>4/21  ANTIBIOTICS:  ABX per ID  4/13 rocephin >>>4/13  4/13 azithromax >>>4/13  4/13 IV Vanc >>>4/20  4/13 IV Zosyn >>>4/20  mycofungin 4/18>>>4/22  Diflucan 4/22   HPI/Subjective: Feel well, no complaints  Objective: Filed Vitals:   07/13/12 1651 07/13/12 1814 07/13/12 2203 07/14/12 0454  BP: 126/75 113/61 84/43 97/49   Pulse:  72 75 79  Temp:  99.3 F (37.4 C) 99 F (37.2 C) 99.4 F (37.4 C)  TempSrc:  Oral Oral Oral  Resp: 14 15 16 16   Height:      Weight:   134.854 kg (297 lb 4.8 oz)   SpO2: 94% 85% 98% 92%    Intake/Output Summary (Last 24 hours) at 07/14/12 0900 Last data filed at 07/13/12 0904  Gross per 24 hour  Intake    120 ml  Output      0 ml  Net    120 ml   Filed Weights   07/12/12 1054 07/12/12 2115 07/13/12 2203  Weight: 134.5 kg (296 lb 8.3 oz) 134.628 kg (296 lb 12.8 oz) 134.854 kg (297 lb 4.8 oz)    Exam:   General:  AAOx3  Cardiovascular:  S1S2/RRR  Respiratory: Diminished at bases  Abdomen: soft, obese, NT, BS present  Ext: trace edema  Data Reviewed: Basic Metabolic Panel:  Recent Labs Lab 07/08/12 1227  07/09/12 0400 07/09/12 1100 07/10/12 0333 07/11/12 0350 07/12/12 0642  NA 140  < > 141 143 141 140 142  K 4.2  < > 3.7 4.0 4.8 4.4 3.5  CL 99  < > 100 102 100 99 99  CO2 26  < > 24 25 22 28 25   GLUCOSE 94  < > 78 91 124* 138* 96  BUN 38*  < > 48* 51* 59* 46* 69*  CREATININE 10.17*  < > 11.39* 12.44* 13.98* 9.47* 12.43*  CALCIUM 9.1  < > 9.2 9.3 9.2 9.3 8.8  PHOS 6.9*  --  5.1* 7.2*  --  6.9* 9.1*  < > = values in this interval not displayed. Liver Function Tests:  Recent Labs Lab 07/08/12 1227 07/09/12 0400 07/09/12 1100 07/11/12 0350 07/12/12 0642  ALBUMIN 2.3* 2.4* 2.4* 2.6* 2.4*   No results found for this basename: LIPASE, AMYLASE,  in the last 168 hours No results found for this basename: AMMONIA,  in the last 168 hours CBC:  Recent Labs Lab 07/08/12 0758  07/09/12 0400 07/10/12 0333 07/12/12 0642 07/14/12 0758  WBC 11.3* 9.0 10.6* 9.2 9.8  HGB 8.5* 7.6* 7.6* 7.7* 8.1*  HCT 27.4* 23.3* 24.2* 24.7* 26.3*  MCV 92.3 89.6 90.6 90.5 91.0  PLT 163 151 176 215 212   Cardiac Enzymes:  Recent Labs Lab 07/08/12 1228  TROPONINI <0.30   BNP (last 3 results)  Recent Labs  09/07/11 2000 06/19/12 1927  PROBNP 24858.0* 3703.0*   CBG:  Recent Labs Lab 07/12/12 1216 07/12/12 1633 07/12/12 2114 07/13/12 0804 07/14/12 0758  GLUCAP 120* 116* 112* 99 96    Recent Results (from the past 240 hour(s))  FUNGUS CULTURE W SMEAR     Status: None   Collection Time    07/08/12  8:30 AM      Result Value Range Status   Specimen Description CATH TIP   Final   Special Requests POF ZOSYN   Final   Fungal Smear FEW YEAST   Final   Culture CULTURE IN PROGRESS FOR FOUR WEEKS   Final   Report Status PENDING   Incomplete  CATH TIP CULTURE     Status: None   Collection Time    07/08/12  8:30  AM      Result Value Range Status   Specimen Description CATH TIP   Final   Special Requests POF ZOSYN   Final   Culture >100 COLONIES YEAST, NOT CANDIDA ALBICANS   Final   Report Status 07/11/2012 FINAL   Final  MRSA PCR SCREENING     Status: Abnormal   Collection Time    07/08/12 11:01 AM      Result Value Range Status   MRSA by PCR INVALID RESULTS, SPECIMEN SENT FOR CULTURE (*) NEGATIVE Final   Comment:            The GeneXpert MRSA Assay (FDA     approved for NASAL specimens     only), is one component of a     comprehensive MRSA colonization     surveillance program. It is not     intended to diagnose MRSA     infection nor to guide or     monitor treatment for     MRSA infections.  MRSA CULTURE     Status: None   Collection Time    07/08/12 11:01 AM      Result Value Range Status   Specimen Description NASAL SWAB   Final   Special Requests NONE MRSA PCR WAS INVALID TWICE   Final   Culture     Final   Value: NO STAPHYLOCOCCUS AUREUS ISOLATED     Note: NOMRSA   Report Status 07/10/2012 FINAL   Final     Studies: Ir Fluoro Guide Cv Line Right  07/13/2012  *RADIOLOGY REPORT*  Indication: In need of access for initiation of hemodialysis  TUNNELED CENTRAL VENOUS HEMODIALYSIS CATHETER PLACEMENT WITH ULTRASOUND AND FLUOROSCOPIC GUIDANCE  Medications: Versed 2 mg IV; Fentanyl 50 mcg IV; Ancef 2 gm IV; The IV antibiotic was given in an appropriate time interval prior to skin puncture.  Contrast: None  Total Moderate Sedation Time: 16 minutes.  Fluoroscopy Time: 1.1 minutes.  Complications:  None immediate  Findings / Procedure:  Informed written consent was obtained from the patient after a discussion of the risks, benefits, and alternatives to treatment. Questions regarding the procedure were encouraged and answered. The right neck and chest were prepped with chlorhexidine in a sterile fashion, and a sterile drape was applied covering  the operative field.  Maximum barrier sterile  technique with sterile gowns and gloves were used for the procedure.  A timeout was performed prior to the initiation of the procedure.  After creating a small venotomy incision, a micropuncture kit was utilized to access the right internal jugular vein under direct, real-time ultrasound guidance after the overlying soft tissues were anesthetized with 1% lidocaine with epinephrine.  Ultrasound image documentation was performed.  The microwire was kinked to measure appropriate catheter length.  A stiff glidewire was advanced to the level of the IVC and the micropuncture sheath was exchanged for a peel-away sheath.  A hemosplit tunneled hemodialysis catheter measuring 23 cm from tip to cuff was tunneled in a retrograde fashion from the anterior chest wall to the venotomy incision.  The catheter was then placed through the peel-away sheath with tips ultimately positioned within the superior aspect of the right atrium.  Final catheter positioning was confirmed and documented with a spot radiographic image.  The catheter aspirates and flushes normally.  The catheter was flushed with appropriate volume heparin dwells.  The catheter exit site was secured with a 0-Prolene retention suture.  The venotomy incision was closed with an interrupted 4-0 Vicryl, Dermabond and Steri-strips.  Dressings were applied.  The patient tolerated the procedure well without immediate post procedural complication.  IMPRESSION:  Successful placement of 23 cm tip to cuff tunneled hemodialysis catheter via the right internal jugular vein with tips terminating within the superior aspect of the right atrium.  The catheter is ready for immediate use.   Original Report Authenticated By: Tacey Ruiz, MD    Ir US Guide Vasc Access Right  07/13/2012  *RADIOLOGY REPORT*  Indication: In need of access for initiation of hemodialysis  TUNNELED CENTRAL VENOUS HEMODIALYSIS CATHETER PLACEMENT WITH ULTRASOUND AND FLUOROSCOPIC GUIDANCE  Medications: Versed 2  mg IV; Fentanyl 50 mcg IV; Ancef 2 gm IV; The IV antibiotic was given in an appropriate time interval prior to skin puncture.  Contrast: None  Total Moderate Sedation Time: 16 minutes.  Fluoroscopy Time: 1.1 minutes.  Complications:  None immediate  Findings / Procedure:  Informed written consent was obtained from the patient after a discussion of the risks, benefits, and alternatives to treatment. Questions regarding the procedure were encouraged and answered. The right neck and chest were prepped with chlorhexidine in a sterile fashion, and a sterile drape was applied covering the operative field.  Maximum barrier sterile technique with sterile gowns and gloves were used for the procedure.  A timeout was performed prior to the initiation of the procedure.  After creating a small venotomy incision, a micropuncture kit was utilized to access the right internal jugular vein under direct, real-time ultrasound guidance after the overlying soft tissues were anesthetized with 1% lidocaine with epinephrine.  Ultrasound image documentation was performed.  The microwire was kinked to measure appropriate catheter length.  A stiff glidewire was advanced to the level of the IVC and the micropuncture sheath was exchanged for a peel-away sheath.  A hemosplit tunneled hemodialysis catheter measuring 23 cm from tip to cuff was tunneled in a retrograde fashion from the anterior chest wall to the venotomy incision.  The catheter was then placed through the peel-away sheath with tips ultimately positioned within the superior aspect of the right atrium.  Final catheter positioning was confirmed and documented with a spot radiographic image.  The catheter aspirates and flushes normally.  The catheter was flushed with appropriate volume heparin dwells.  The catheter  exit site was secured with a 0-Prolene retention suture.  The venotomy incision was closed with an interrupted 4-0 Vicryl, Dermabond and Steri-strips.  Dressings were  applied.  The patient tolerated the procedure well without immediate post procedural complication.  IMPRESSION:  Successful placement of 23 cm tip to cuff tunneled hemodialysis catheter via the right internal jugular vein with tips terminating within the superior aspect of the right atrium.  The catheter is ready for immediate use.   Original Report Authenticated By: Tacey Ruiz, MD     Scheduled Meds: . alteplase  2 mg Intracatheter Once  . aspirin  325 mg Oral Daily  . bisoprolol  2.5 mg Oral Daily  . calcitRIOL  0.25 mcg Oral Custom  . darbepoetin (ARANESP) injection - DIALYSIS  200 mcg Intravenous Q Thu-HD  . enoxaparin (LOVENOX) injection  30 mg Subcutaneous Q24H  . feeding supplement  1 Container Oral TID BM  . fluconazole  200 mg Oral Daily  . insulin aspart  0-9 Units Subcutaneous TID WC  . lanthanum  1,000 mg Oral TID WC  . pantoprazole  40 mg Oral QHS  . tuberculin  5 Units Intradermal Once   Continuous Infusions:   Principal Problem:   Acute respiratory failure with hypercapnia Active Problems:   Systolic congestive heart failure with reduced left ventricular function, NYHA class 1- EF 40-45% 2011 ECHO   CKD (chronic kidney disease) requiring chronic PERITONEAL dialysis   HTN (hypertension)   Hypercapnia   Pulmonary edema   Obesity hypoventilation syndrome   Septic shock(785.52)   Peritoneal dialysis catheter exit site infection    Time spent:    [facility]  [facility] Pager [phone]. If 7PM-7AM, please contact night-coverage at [email or web address], password [facility] 07/14/2012, 9:00 AM  LOS: 13 days

## 2012-07-14 NOTE — Progress Notes (Deleted)
Pt does not want to wear BiPAP.

## 2012-07-14 NOTE — Progress Notes (Signed)
Subjective:  Feeling much better, eating breakfast without complaints.  Objective: Vital signs in last 24 hours: Temp:  [93.3 F (34.1 C)-99.6 F (37.6 C)] 99.4 F (37.4 C) (04/26 0454) Pulse Rate:  [72-80] 79 (04/26 0454) Resp:  [14-26] 16 (04/26 0454) BP: (84-126)/(43-76) 97/49 mmHg (04/26 0454) SpO2:  [85 %-98 %] 92 % (04/26 0454) Weight:  [134.854 kg (297 lb 4.8 oz)] 134.854 kg (297 lb 4.8 oz) (04/25 2203) Weight change: 0.354 kg (12.5 oz)  Intake/Output from previous day: 04/25 0701 - 04/26 0700 In: 120 [P.O.:120] Out: -    EXAM: General appearance:  Alert, in no apparent distress Resp:  CTA without rales, rhonchi, or wheezes Cardio:  RRR without murmur or rub GI: + BS, soft and nontender Extremities: No edema Access:  Right IJ catheter  Lab Results:  Recent Labs  07/12/12 0642  WBC 9.2  HGB 7.7*  HCT 24.7*  PLT 215   BMET:  Recent Labs  07/12/12 0642  NA 142  K 3.5  CL 99  CO2 25  GLUCOSE 96  BUN 69*  CREATININE 12.43*  CALCIUM 8.8  ALBUMIN 2.4*   No results found for this basename: PTH,  in the last 72 hours Iron Studies: No results found for this basename: IRON, TIBC, TRANSFERRIN, FERRITIN,  in the last 72 hours  Assessment/Plan: 1. Septic shock/fungal peritonitis - s/p PD catheter removal 4/20; on fluconazole 200 mg qd; clinically improved with resolution of markers of sepsis (ALI). 2. ESRD - transitioned from PD to HD, secondary to #1; HD on TTS @ [facility], s/p tunneled HD catheter placement yesterday; primary nephrologist in [facility] (Dr. Louis Meckel) was contacted for OP placement , awaiting updates. 3. Anemia od CKD - Hgb 7.7 on 4/24, started Aranesp 200 mcg on Thurs, s/p 1 U PRBCs 4/18. 4. Secondary hyperparathyroidism - Ca 8.8 (10.1 corrected), P 9.1; still on Calcitriol PO, Fosrenol 1 g with meals.  Change PO Calcitriol to IV Hectorol. 5. HTN/Volume - BP 97/49 without meds, currently euvolemic.    LOS: 13 days    Gary Rivera 07/14/2012,8:17 AM

## 2012-07-15 LAB — CBC
HCT: 27.7 % — ABNORMAL LOW (ref 39.0–52.0)
MCH: 28.4 pg (ref 26.0–34.0)
MCV: 91.4 fL (ref 78.0–100.0)
Platelets: 227 10*3/uL (ref 150–400)
RBC: 3.03 MIL/uL — ABNORMAL LOW (ref 4.22–5.81)
RDW: 18 % — ABNORMAL HIGH (ref 11.5–15.5)

## 2012-07-15 MED ORDER — MIDODRINE HCL 2.5 MG PO TABS
2.5000 mg | ORAL_TABLET | Freq: Two times a day (BID) | ORAL | Status: DC
  Administered 2012-07-15 – 2012-07-16 (×2): 2.5 mg via ORAL
  Filled 2012-07-15 (×4): qty 1

## 2012-07-15 NOTE — Progress Notes (Signed)
Subjective:  No complaints, awaiting outpatient hemodialysis placement.  Objective: Vital signs in last 24 hours: Temp:  [98.4 F (36.9 C)-99.3 F (37.4 C)] 98.5 F (36.9 C) (04/27 0522) Pulse Rate:  [45-93] 88 (04/27 0522) Resp:  [17-18] 18 (04/27 0522) BP: (81-112)/(39-65) 81/57 mmHg (04/27 0522) SpO2:  [87 %-98 %] 94 % (04/27 0522) Weight:  [131.2 kg (289 lb 3.9 oz)-137 kg (302 lb 0.5 oz)] 131.2 kg (289 lb 3.9 oz) (04/26 2021) Weight change: 2.146 kg (4 lb 11.7 oz)  Intake/Output from previous day: 04/26 0701 - 04/27 0700 In: 480 [P.O.:480] Out: 3650    EXAM: General appearance:  Alert, in no apparent distress Resp:  CTA without rales, rhonchi, or wheezes Cardio:  RRR without murmur or rub GI:  + BS, soft and nontender Extremities:  No edema Access:  Right IJ catheter  Lab Results:  Recent Labs  07/14/12 0758 07/15/12 0449  WBC 9.8 10.4  HGB 8.1* 8.6*  HCT 26.3* 27.7*  PLT 212 227   BMET:  Recent Labs  07/14/12 0758  NA 139  K 3.9  CL 98  CO2 25  GLUCOSE 100*  BUN 56*  CREATININE 11.50*  CALCIUM 8.8   No results found for this basename: PTH,  in the last 72 hours Iron Studies: No results found for this basename: IRON, TIBC, TRANSFERRIN, FERRITIN,  in the last 72 hours  Assessment/Plan: 1. Septic shock/fungal peritonitis - s/p PD catheter removal 4/20; on fluconazole 200 mg qd; clinically improved with resolution of markers of sepsis (ALI). 2. ESRD - transitioned from PD to HD, secondary to #1; HD on TTS @ [facility], s/p tunneled HD catheter placement 4/25; will likely transfer outpatient care to [facility] or [place] neph[facility] to do outpatient dialysis at [facility]. 3. Anemia of CKD - Hgb 8.1 yesterday, started Aranesp 200 mcg on Thurs, s/p 1 U PRBCs 4/18. 4. Secondary hyperparathyroidism - Ca 8.8 (10.1 corrected), P 9.1; Calcitriol PO changed to Hectorol 2 mcg IV yesterday, Fosrenol 1 g with meals. 5. HTN/Volume - BP 81/57 without  meds, current wt 131.2 kg s/p net UF 3.7 L yesterday.      LOS: 14 days   Merick Kelleher 07/15/2012,7:24 AM

## 2012-07-15 NOTE — Progress Notes (Signed)
[facility] PROGRESS NOTE  Gary Rivera ZOX:[id number] DOB: January 18, 1961 DOA: 07/01/2012 PCP: [name], MD  Assessment/Plan: 1. Septic shock - resolved - Fungal peritonitis:  - s/p PD cath removal 4/20, on HD in the interim - on fluconazole now per ID recs for 22 more days - will ask micro to do sensitivities to Fluconazole and Voriconazole  2. ESRD: on HD in the inerim now -Tunneled HD catheter per IR 4/25 -Renal contacted primary nephrologist in [place] and awaiting acceptance to outpatient HD center -PPD placed 4/24 and negative  3. Anemia of chronic disease and acute illness -s/p I unit PRBC on 4/18 -Aranesp per renal - Hb stable  4. Hypotension: secondary to ESRD - BP soft, but asymptomatic - start midodrine  5. DM: - stable, SSI  DVt proph: on heparin SQ  Code Status: FULL Family Communication:  Disposition Plan: home in with [facility] services once outpatient HD set up, Renal working on this   Consultants:  PCCM  Renal  ID   Procedures: ETT 4/13 >>4/15  L IJ CVL 4/14 >>  R IJ HD cath >>  ETT 4/21>>4/21  ANTIBIOTICS:  ABX per ID  4/13 rocephin >>>4/13  4/13 azithromax >>>4/13  4/13 IV Vanc >>>4/20  4/13 IV Zosyn >>>4/20  mycofungin 4/18>>>4/22  Diflucan 4/22   HPI/Subjective: Feel well, no complaints  Objective: Filed Vitals:   07/14/12 1800 07/14/12 1847 07/14/12 2021 07/15/12 0522  BP: 97/62 96/51 90/52  81/57  Pulse: 84 88 93 88  Temp: 98.4 F (36.9 C) 99.3 F (37.4 C) 99.3 F (37.4 C) 98.5 F (36.9 C)  TempSrc: Oral Oral Oral Oral  Resp: 18 17 18 18   Height:      Weight: 132.3 kg (291 lb 10.7 oz)  131.2 kg (289 lb 3.9 oz)   SpO2: 95% 87% 90% 94%    Intake/Output Summary (Last 24 hours) at 07/15/12 1023 Last data filed at 07/14/12 2003  Gross per 24 hour  Intake    240 ml  Output   3650 ml  Net  -3410 ml   Filed Weights   07/14/12 1404 07/14/12 1800 07/14/12 2021  Weight: 137 kg (302 lb 0.5 oz) 132.3 kg (291 lb  10.7 oz) 131.2 kg (289 lb 3.9 oz)    Exam:   General:  AAOx3  Cardiovascular: S1S2/RRR  Respiratory: Diminished at bases  Abdomen: soft, obese, NT, BS present  Ext: no edema  Data Reviewed: Basic Metabolic Panel:  Recent Labs Lab 07/08/12 1227  07/09/12 0400 07/09/12 1100 07/10/12 0333 07/11/12 0350 07/12/12 0642 07/14/12 0758  NA 140  < > 141 143 141 140 142 139  K 4.2  < > 3.7 4.0 4.8 4.4 3.5 3.9  CL 99  < > 100 102 100 99 99 98  CO2 26  < > 24 25 22 28 25 25   GLUCOSE 94  < > 78 91 124* 138* 96 100*  BUN 38*  < > 48* 51* 59* 46* 69* 56*  CREATININE 10.17*  < > 11.39* 12.44* 13.98* 9.47* 12.43* 11.50*  CALCIUM 9.1  < > 9.2 9.3 9.2 9.3 8.8 8.8  PHOS 6.9*  --  5.1* 7.2*  --  6.9* 9.1*  --   < > = values in this interval not displayed. Liver Function Tests:  Recent Labs Lab 07/08/12 1227 07/09/12 0400 07/09/12 1100 07/11/12 0350 07/12/12 0642  ALBUMIN 2.3* 2.4* 2.4* 2.6* 2.4*   No results found for this basename: LIPASE, AMYLASE,  in the  last 168 hours No results found for this basename: AMMONIA,  in the last 168 hours CBC:  Recent Labs Lab 07/09/12 0400 07/10/12 0333 07/12/12 0642 07/14/12 0758 07/15/12 0449  WBC 9.0 10.6* 9.2 9.8 10.4  HGB 7.6* 7.6* 7.7* 8.1* 8.6*  HCT 23.3* 24.2* 24.7* 26.3* 27.7*  MCV 89.6 90.6 90.5 91.0 91.4  PLT 151 176 215 212 227   Cardiac Enzymes:  Recent Labs Lab 07/08/12 1228  TROPONINI <0.30   BNP (last 3 results)  Recent Labs  09/07/11 2000 06/19/12 1927  PROBNP 24858.0* 3703.0*   CBG:  Recent Labs Lab 07/12/12 1633 07/12/12 2114 07/13/12 0804 07/14/12 0758 07/14/12 1845  GLUCAP 116* 112* 99 96 92    Recent Results (from the past 240 hour(s))  FUNGUS CULTURE W SMEAR     Status: None   Collection Time    07/08/12  8:30 AM      Result Value Range Status   Specimen Description CATH TIP   Final   Special Requests POF ZOSYN   Final   Fungal Smear FEW YEAST   Final   Culture CULTURE IN PROGRESS  FOR FOUR WEEKS   Final   Report Status PENDING   Incomplete  CATH TIP CULTURE     Status: None   Collection Time    07/08/12  8:30 AM      Result Value Range Status   Specimen Description CATH TIP   Final   Special Requests POF ZOSYN   Final   Culture >100 COLONIES YEAST, NOT CANDIDA ALBICANS   Final   Report Status 07/11/2012 FINAL   Final  MRSA PCR SCREENING     Status: Abnormal   Collection Time    07/08/12 11:01 AM      Result Value Range Status   MRSA by PCR INVALID RESULTS, SPECIMEN SENT FOR CULTURE (*) NEGATIVE Final   Comment:            The GeneXpert MRSA Assay (FDA     approved for NASAL specimens     only), is one component of a     comprehensive MRSA colonization     surveillance program. It is not     intended to diagnose MRSA     infection nor to guide or     monitor treatment for     MRSA infections.  MRSA CULTURE     Status: None   Collection Time    07/08/12 11:01 AM      Result Value Range Status   Specimen Description NASAL SWAB   Final   Special Requests NONE MRSA PCR WAS INVALID TWICE   Final   Culture     Final   Value: NO STAPHYLOCOCCUS AUREUS ISOLATED     Note: NOMRSA   Report Status 07/10/2012 FINAL   Final     Studies: Ir Fluoro Guide Cv Line Right  07/13/2012  *RADIOLOGY REPORT*  Indication: In need of access for initiation of hemodialysis  TUNNELED CENTRAL VENOUS HEMODIALYSIS CATHETER PLACEMENT WITH ULTRASOUND AND FLUOROSCOPIC GUIDANCE  Medications: Versed 2 mg IV; Fentanyl 50 mcg IV; Ancef 2 gm IV; The IV antibiotic was given in an appropriate time interval prior to skin puncture.  Contrast: None  Total Moderate Sedation Time: 16 minutes.  Fluoroscopy Time: 1.1 minutes.  Complications:  None immediate  Findings / Procedure:  Informed written consent was obtained from the patient after a discussion of the risks, benefits, and alternatives to treatment. Questions regarding the procedure were  encouraged and answered. The right neck and chest were  prepped with chlorhexidine in a sterile fashion, and a sterile drape was applied covering the operative field.  Maximum barrier sterile technique with sterile gowns and gloves were used for the procedure.  A timeout was performed prior to the initiation of the procedure.  After creating a small venotomy incision, a micropuncture kit was utilized to access the right internal jugular vein under direct, real-time ultrasound guidance after the overlying soft tissues were anesthetized with 1% lidocaine with epinephrine.  Ultrasound image documentation was performed.  The microwire was kinked to measure appropriate catheter length.  A stiff glidewire was advanced to the level of the IVC and the micropuncture sheath was exchanged for a peel-away sheath.  A hemosplit tunneled hemodialysis catheter measuring 23 cm from tip to cuff was tunneled in a retrograde fashion from the anterior chest wall to the venotomy incision.  The catheter was then placed through the peel-away sheath with tips ultimately positioned within the superior aspect of the right atrium.  Final catheter positioning was confirmed and documented with a spot radiographic image.  The catheter aspirates and flushes normally.  The catheter was flushed with appropriate volume heparin dwells.  The catheter exit site was secured with a 0-Prolene retention suture.  The venotomy incision was closed with an interrupted 4-0 Vicryl, Dermabond and Steri-strips.  Dressings were applied.  The patient tolerated the procedure well without immediate post procedural complication.  IMPRESSION:  Successful placement of 23 cm tip to cuff tunneled hemodialysis catheter via the right internal jugular vein with tips terminating within the superior aspect of the right atrium.  The catheter is ready for immediate use.   Original Report Authenticated By: Tacey Ruiz, MD    Ir US Guide Vasc Access Right  07/13/2012  *RADIOLOGY REPORT*  Indication: In need of access for initiation  of hemodialysis  TUNNELED CENTRAL VENOUS HEMODIALYSIS CATHETER PLACEMENT WITH ULTRASOUND AND FLUOROSCOPIC GUIDANCE  Medications: Versed 2 mg IV; Fentanyl 50 mcg IV; Ancef 2 gm IV; The IV antibiotic was given in an appropriate time interval prior to skin puncture.  Contrast: None  Total Moderate Sedation Time: 16 minutes.  Fluoroscopy Time: 1.1 minutes.  Complications:  None immediate  Findings / Procedure:  Informed written consent was obtained from the patient after a discussion of the risks, benefits, and alternatives to treatment. Questions regarding the procedure were encouraged and answered. The right neck and chest were prepped with chlorhexidine in a sterile fashion, and a sterile drape was applied covering the operative field.  Maximum barrier sterile technique with sterile gowns and gloves were used for the procedure.  A timeout was performed prior to the initiation of the procedure.  After creating a small venotomy incision, a micropuncture kit was utilized to access the right internal jugular vein under direct, real-time ultrasound guidance after the overlying soft tissues were anesthetized with 1% lidocaine with epinephrine.  Ultrasound image documentation was performed.  The microwire was kinked to measure appropriate catheter length.  A stiff glidewire was advanced to the level of the IVC and the micropuncture sheath was exchanged for a peel-away sheath.  A hemosplit tunneled hemodialysis catheter measuring 23 cm from tip to cuff was tunneled in a retrograde fashion from the anterior chest wall to the venotomy incision.  The catheter was then placed through the peel-away sheath with tips ultimately positioned within the superior aspect of the right atrium.  Final catheter positioning was confirmed and documented with a  spot radiographic image.  The catheter aspirates and flushes normally.  The catheter was flushed with appropriate volume heparin dwells.  The catheter exit site was secured with a  0-Prolene retention suture.  The venotomy incision was closed with an interrupted 4-0 Vicryl, Dermabond and Steri-strips.  Dressings were applied.  The patient tolerated the procedure well without immediate post procedural complication.  IMPRESSION:  Successful placement of 23 cm tip to cuff tunneled hemodialysis catheter via the right internal jugular vein with tips terminating within the superior aspect of the right atrium.  The catheter is ready for immediate use.   Original Report Authenticated By: Tacey Ruiz, MD     Scheduled Meds: . alteplase  2 mg Intracatheter Once  . aspirin  325 mg Oral Daily  . bisoprolol  2.5 mg Oral Daily  . darbepoetin (ARANESP) injection - DIALYSIS  200 mcg Intravenous Q Thu-HD  . doxercalciferol  2 mcg Intravenous Q T,Th,Sa-HD  . enoxaparin (LOVENOX) injection  30 mg Subcutaneous Q24H  . feeding supplement  1 Container Oral TID BM  . fluconazole  200 mg Oral Daily  . insulin aspart  0-9 Units Subcutaneous TID WC  . lanthanum  1,000 mg Oral TID WC  . pantoprazole  40 mg Oral QHS   Continuous Infusions:   Principal Problem:   Acute respiratory failure with hypercapnia Active Problems:   Systolic congestive heart failure with reduced left ventricular function, NYHA class 1- EF 40-45% 2011 ECHO   CKD (chronic kidney disease) requiring chronic PERITONEAL dialysis   HTN (hypertension)   Hypercapnia   Pulmonary edema   Obesity hypoventilation syndrome   Septic shock(785.52)   Peritoneal dialysis catheter exit site infection    Time spent:    [facility]  [facility] Pager [phone]. If 7PM-7AM, please contact night-coverage at [email or web address], password [facility] 07/15/2012, 10:23 AM  LOS: 14 days

## 2012-07-15 NOTE — Progress Notes (Signed)
Pt refusing CBG checks. Will continue to monitor.

## 2012-07-15 NOTE — Progress Notes (Signed)
Pt bp 71/56. Pt is asymptomatic, alert and oriented, states he feels normal. Dr Jomarie Longs notified, states ok to hold am bisoprolol, and requested to notify renal as well. Renal PA notified, no new orders. Will continue to monitor.

## 2012-07-15 NOTE — Progress Notes (Signed)
Late Entry:  PPD reading results negative read 07/14/12 1948

## 2012-07-15 NOTE — Progress Notes (Signed)
Pt is refuses to wear BiPAP. Pt said he is leaving tomorrow anyway. RT made pt aware that if he changed his mind to call RT.

## 2012-07-15 NOTE — Progress Notes (Signed)
I have personally seen and examined this patient and agree with the assessment/plan as outlined above by Lyles PA. Probable word on OP HD unit placement tomorrow to allow for DC thereafter. Balthazar Dooly K.,MD 07/15/2012 10:41 AM

## 2012-07-15 NOTE — Progress Notes (Signed)
Resp Care Note:Pt staes he has not worn  BiPAP in several days and doesn't feel like he needs it,on 3lpm O2 via [place].

## 2012-07-16 LAB — CBC
Hemoglobin: 8.9 g/dL — ABNORMAL LOW (ref 13.0–17.0)
MCH: 28.1 pg (ref 26.0–34.0)
MCHC: 30.7 g/dL (ref 30.0–36.0)
MCV: 91.5 fL (ref 78.0–100.0)

## 2012-07-16 LAB — BASIC METABOLIC PANEL
BUN: 50 mg/dL — ABNORMAL HIGH (ref 6–23)
Calcium: 9.1 mg/dL (ref 8.4–10.5)
Creatinine, Ser: 11.39 mg/dL — ABNORMAL HIGH (ref 0.50–1.35)
GFR calc non Af Amer: 4 mL/min — ABNORMAL LOW (ref >90)
Glucose, Bld: 100 mg/dL — ABNORMAL HIGH (ref 70–99)
Sodium: 140 mEq/L (ref 135–145)

## 2012-07-16 MED ORDER — DOXERCALCIFEROL 4 MCG/2ML IV SOLN
INTRAVENOUS | Status: AC
  Administered 2012-07-16: 2 ug via INTRAVENOUS
  Filled 2012-07-16: qty 2

## 2012-07-16 MED ORDER — NEPRO/CARBSTEADY PO LIQD: 237.0000 mL | ORAL | Status: DC | PRN

## 2012-07-16 MED ORDER — DARBEPOETIN ALFA-POLYSORBATE 200 MCG/0.4ML IJ SOLN
INTRAMUSCULAR | Status: AC
  Filled 2012-07-16: qty 0.4

## 2012-07-16 MED ORDER — HEPARIN SODIUM (PORCINE) 1000 UNIT/ML DIALYSIS
1000.0000 [IU] | INTRAMUSCULAR | Status: DC | PRN
  Filled 2012-07-16: qty 1

## 2012-07-16 MED ORDER — INDOMETHACIN 25 MG PO CAPS
25.0000 mg | ORAL_CAPSULE | Freq: Two times a day (BID) | ORAL | Status: DC
  Administered 2012-07-16: 25 mg via ORAL
  Filled 2012-07-16 (×3): qty 1

## 2012-07-16 MED ORDER — OXYCODONE-ACETAMINOPHEN 5-325 MG PO TABS: 1.0000 | ORAL_TABLET | ORAL | Status: DC | PRN

## 2012-07-16 MED ORDER — INDOMETHACIN 25 MG PO CAPS: 25.0000 mg | ORAL_CAPSULE | Freq: Two times a day (BID) | ORAL | Status: DC

## 2012-07-16 MED ORDER — MIDODRINE HCL 2.5 MG PO TABS: 2.5000 mg | ORAL_TABLET | Freq: Two times a day (BID) | ORAL | Status: DC

## 2012-07-16 MED ORDER — SODIUM CHLORIDE 0.9 % IV SOLN: 100.0000 mL | INTRAVENOUS | Status: DC | PRN

## 2012-07-16 MED ORDER — RENA-VITE PO TABS
1.0000 | ORAL_TABLET | Freq: Every day | ORAL | Status: DC
  Filled 2012-07-16: qty 1

## 2012-07-16 MED ORDER — FLUCONAZOLE 200 MG PO TABS: 200.0000 mg | ORAL_TABLET | Freq: Every day | ORAL | Status: DC

## 2012-07-16 MED ORDER — PANTOPRAZOLE SODIUM 40 MG PO TBEC: 40.0000 mg | DELAYED_RELEASE_TABLET | Freq: Every day | ORAL | Status: DC

## 2012-07-16 MED ORDER — LIDOCAINE HCL (PF) 1 % IJ SOLN: 5.0000 mL | INTRAMUSCULAR | Status: DC | PRN

## 2012-07-16 MED ORDER — LIDOCAINE-PRILOCAINE 2.5-2.5 % EX CREA: 1.0000 "application " | TOPICAL_CREAM | CUTANEOUS | Status: DC | PRN

## 2012-07-16 MED ORDER — ALTEPLASE 2 MG IJ SOLR
2.0000 mg | Freq: Once | INTRAMUSCULAR | Status: DC | PRN
  Filled 2012-07-16: qty 2

## 2012-07-16 MED ORDER — BISOPROLOL FUMARATE 5 MG PO TABS: 2.5000 mg | ORAL_TABLET | Freq: Every day | ORAL | Status: DC

## 2012-07-16 MED ORDER — PENTAFLUOROPROP-TETRAFLUOROETH EX AERO: 1.0000 "application " | INHALATION_SPRAY | CUTANEOUS | Status: DC | PRN

## 2012-07-16 MED ORDER — HEPARIN SODIUM (PORCINE) 1000 UNIT/ML DIALYSIS
20.0000 [IU]/kg | INTRAMUSCULAR | Status: DC | PRN
  Filled 2012-07-16: qty 3

## 2012-07-16 MED FILL — Heparin Sodium (Porcine) Inj 1000 Unit/ML: INTRAMUSCULAR | Qty: 10 | Status: AC

## 2012-07-16 NOTE — Procedures (Signed)
Patient was seen on dialysis and the procedure was supervised.  BFR 400  Via PC BP is  107/68.   Patient appears to be tolerating treatment well  Shardai Star A 07/16/2012

## 2012-07-16 NOTE — Progress Notes (Signed)
Gary Rivera Progress Note  Subjective:   Had some foot pain- thought to be gout, given indocin- just took first dose- now have found out that OP HD will be MWF at the [facility] so will need HD prior to discharge  Objective Filed Vitals:   07/15/12 1000 07/15/12 1800 07/15/12 2043 07/16/12 0452  BP: 71/34 78/46 91/51  96/62  Pulse: 88 84 85 85  Temp: 99 F (37.2 C) 98.9 F (37.2 C) 98.7 F (37.1 C) 98.2 F (36.8 C)  TempSrc: Oral Oral Oral Oral  Resp: 18 18 20 18   Height:      Weight:   131.3 kg (289 lb 7.4 oz)   SpO2: 88% 88% 91% 100%   Physical Exam General: obese NAD wearing O2 (always wears) Heart: RRR Lungs: no wheezes or rales Abdomen: obese  Extremities: no significant LE edema Dialysis Access: PC  Assessment/Plan: 1. Septic shock/fungal peritonitis - s/p PD catheter removal 4/20; on fluconazole 200 mg qd; clinically improved with resolution of markers of sepsis (ALI). 2. ESRD - transitioned from PD to HD, secondary to #1; HD on TTS @ [facility], s/p tunneled HD catheter placement 4/25; will likely transfer outpatient care to [facility] or [place] nephrology to do outpatient dialysis at [facility]; awaiting acceptance- dialyze at 4 hours; but may need longer time as an outpt due to size- found out OP spot accepted MWF 3. Anemia of CKD - Hgb 8.9 - trending up Aranesp 200 mcg on Thurs, s/p 1 U PRBCs 4/18.- Fe studies not done 4. Secondary hyperparathyroidism - Ca 8.8 (10.1 corrected), P 9.1; Calcitriol PO changed to Hectorol 2 mcg IV yesterday, Fosrenol 1 g with meals. 5. Hypotension/Volume - appears chronic - not on midodrine/on bisoprolol 2.5 since 4/21 , last post HD weight 131.2 kg - needs orthstatics pre and post HD Tuesday.- does he need bisoprolol - I have written parameters for holding if systolic  Weights down significantly since admission. Not symptomatic-small dose bisoprolol will give parameters would advise do not take if SBP is  less than 100 and would not give on AMs of HD 6. Malnutrition - albumin 2.4 - changed to high protein renal diet and add multi vitmain 7. DM - BS controlled  Sheffield Slider, PA-C [facility] [phone] 07/16/2012,10:43 AM  LOS: 15 days   Patient seen and examined, agree with above note with above modifications.  52 year old BM ESRD- needed transition from PD to HD in the setting of fungal peritonitis.  Has been accepted at the [facility] HD center, will do HD this PM and then pt could go home this evening.   Annie Sable, MD 07/16/2012      Additional Objective Labs: Basic Metabolic Panel:  Recent Labs Lab 07/09/12 1100  07/11/12 0350 07/12/12 0642 07/14/12 0758 07/16/12 0745  NA 143  < > 140 142 139 140  K 4.0  < > 4.4 3.5 3.9 4.4  CL 102  < > 99 99 98 98  CO2 25  < > 28 25 25 26   GLUCOSE 91  < > 138* 96 100* 100*  BUN 51*  < > 46* 69* 56* 50*  CREATININE 12.44*  < > 9.47* 12.43* 11.50* 11.39*  CALCIUM 9.3  < > 9.3 8.8 8.8 9.1  PHOS 7.2*  --  6.9* 9.1*  --   --   < > = values in this interval not displayed. Liver Function Tests:  Recent Labs Lab 07/09/12 1100  07/11/12 0350 07/12/12 0642  ALBUMIN 2.4* 2.6* 2.4*   CBC:  Recent Labs Lab 07/10/12 0333 07/12/12 0642 07/14/12 0758 07/15/12 0449 07/16/12 0745  WBC 10.6* 9.2 9.8 10.4 10.2  HGB 7.6* 7.7* 8.1* 8.6* 8.9*  HCT 24.2* 24.7* 26.3* 27.7* 29.0*  MCV 90.6 90.5 91.0 91.4 91.5  PLT 176 215 212 227 224   Recent Labs Lab 07/12/12 1633 07/12/12 2114 07/13/12 0804 07/14/12 0758 07/14/12 1845  GLUCAP 116* 112* 99 96 92  Medications:   . alteplase  2 mg Intracatheter Once  . aspirin  325 mg Oral Daily  . bisoprolol  2.5 mg Oral Daily  . darbepoetin (ARANESP) injection - DIALYSIS  200 mcg Intravenous Q Thu-HD  . doxercalciferol  2 mcg Intravenous Q T,Th,Sa-HD  . enoxaparin (LOVENOX) injection  30 mg Subcutaneous Q24H  . feeding supplement  1 Container Oral TID BM  .  fluconazole  200 mg Oral Daily  . indomethacin  25 mg Oral BID WC  . insulin aspart  0-9 Units Subcutaneous TID WC  . lanthanum  1,000 mg Oral TID WC  . midodrine  2.5 mg Oral BID WC  . pantoprazole  40 mg Oral QHS

## 2012-07-16 NOTE — Progress Notes (Addendum)
Received phone call from [name] @ [phone] from [facility] regarding the patients transfer to [facility] HD center. Stated that pts records need to be faxed including: labs, latest notes, HD treatment flow sheets, xrays and any other tests as well as MD H&p. Fax to [employer] at [facility] at fax #[phone]. [name] reported that patient has a spot at the kidney center however, she is awaiting a specific appointment day/time. Stated that this would be completed today. Celester Morgan, Charlyne Quale

## 2012-07-16 NOTE — Progress Notes (Signed)
Called and spoke with Forest Gleason at [facility] Stated that patient's HD appointment is for MWF at 1120 at [facility] in [place]. Krystle Polcyn, Charlyne Quale

## 2012-07-16 NOTE — Discharge Summary (Signed)
Physician Discharge Summary  Plato Alspaugh Gatz ZOX:[id number] DOB: 09-Feb-1961 DOA: 07/01/2012  PCP: [name], MD  Admit date: 07/01/2012 Discharge date: 07/16/2012  Time spent: 50 minutes  Recommendations for Outpatient Follow-up:  1. PCP Dr.Sami in 1 week 2. Dr.Byerly CCS in 2 weeks 3. Outpatient HD at [facility]  Discharge Diagnoses:  Principal Problem:   Acute respiratory failure with hypercapnia Active Problems:   Systolic congestive heart failure with reduced left ventricular function, NYHA class 1- EF 40-45% 2011 ECHO   CKD (chronic kidney disease) requiring chronic PERITONEAL dialysis   HTN (hypertension)   Hypercapnia   Pulmonary edema   Obesity hypoventilation syndrome   Septic shock(785.52)   Peritoneal dialysis catheter exit site infection  Fungal peritonitis  Gout  Discharge Condition: improved  Diet recommendation: Renal diet  Filed Weights   07/15/12 2043 07/16/12 1434 07/16/12 1838  Weight: 131.3 kg (289 lb 7.4 oz) 130.9 kg (288 lb 9.3 oz) 130.9 kg (288 lb 9.3 oz)    History of present illness:  BRIEF PATIENT DESCRIPTION: Mr. Gary Rivera 52 yo AA man pmh of pulmonary HTN, systolic HF, ESRD on peritoneal dialysis, and OHS presented with abdominal pain after 8mg  of morphine developed acute respiratory failure with little improvement from narcan and refused bipap. pt was intubated   Hospital Course:  He was admitted with abdominal pain and intubated with acute respiratory failure in ER following 8mg  of morphine. He was found to have septic shock from fungal peritonitis Weaned off vasopressors on 4/14  1. Septic shock - resolved  - seconadary to Fungal peritonitis:  - s/p PD catheter  removal on 4/20, started on Hemodialysis in the interim  - on fluconazole now per ID recs for 20 more days   2. ESRD: on HD in the inerim now  -managed per Renal -Had Tunneled HD catheter placed per IR on 4/25  -Renal contacted primary nephrologist  in [place] and has been accepted to outpatient HD on MWF schedule -PPD placed 4/24 and negative   3. Anemia of chronic disease and acute illness  -s/p I unit PRBC on 4/18  -continued onAranesp per renal  - Hb stable   4. Hypotension: secondary to ESRD  - BP soft, but asymptomatic  - started on midodrine 4/27 -bystolic held on dialysis days  5. DM:  - stable, SSI   6. Gout: -Indomethacin for 3 days  Disposition: DC home with [facility] services after HD today   Discharge Exam: Filed Vitals:   07/16/12 1835 07/16/12 1838 07/16/12 1908 07/16/12 2001  BP: 126/48 113/46 92/41 95/46   Pulse: 85 80 82 85  Temp:  98.3 F (36.8 C)    TempSrc:  Oral    Resp: 16 14 20 19   Height:      Weight:  130.9 kg (288 lb 9.3 oz)    SpO2:  92% 98% 97%    General: AAOx3 Cardiovascular: S1S22/RRR Respiratory: CTAB  Discharge Instructions  Discharge Orders   Future Appointments Provider Department Dept Phone   08/01/2012 11:15 AM Randall Hiss, MD [facility] [phone]   Future Orders Complete By Expires     Discharge instructions  As directed     Comments:      Renal Diet    Increase activity slowly  As directed         Medication List    TAKE these medications       albuterol 108 (90 BASE) MCG/ACT  inhaler  Commonly known as:  PROVENTIL HFA;VENTOLIN HFA  Inhale 2 puffs into the lungs every 6 (six) hours as needed for wheezing.     aspirin EC 81 MG tablet  Take 81 mg by mouth daily.     b complex-vitamin c-folic acid 0.8 MG Tabs  Take 0.8 mg by mouth daily.     bisoprolol 5 MG tablet  Commonly known as:  ZEBETA  Take 0.5 tablets (2.5 mg total) by mouth daily. DO not take on Dialysis days     calcitRIOL 0.25 MCG capsule  Commonly known as:  ROCALTROL  Take 0.25 mcg by mouth See admin instructions. Takes 1 capsule daily Monday through friday     clonazePAM 0.5 MG tablet  Commonly known as:  KLONOPIN  Take 0.5 mg by mouth 2 (two)  times daily as needed. anxiety     famotidine 20 MG tablet  Commonly known as:  PEPCID  Take 20 mg by mouth at bedtime as needed for heartburn.     fluconazole 200 MG tablet  Commonly known as:  DIFLUCAN  Take 1 tablet (200 mg total) by mouth daily. For 20 days     indomethacin 25 MG capsule  Commonly known as:  INDOCIN  Take 1 capsule (25 mg total) by mouth 2 (two) times daily with a meal. For 2 days     lanthanum 1000 MG chewable tablet  Commonly known as:  FOSRENOL  Chew 1,000 mg by mouth 3 (three) times daily with meals.     midodrine 2.5 MG tablet  Commonly known as:  PROAMATINE  Take 1 tablet (2.5 mg total) by mouth 2 (two) times daily with a meal.     multivitamin tablet  Take 1 tablet by mouth daily.     omeprazole 20 MG capsule  Commonly known as:  PRILOSEC  Take 20 mg by mouth daily.     oxyCODONE-acetaminophen 5-325 MG per tablet  Commonly known as:  PERCOCET/ROXICET  Take 1-2 tablets by mouth every 4 (four) hours as needed (severe pain).     pantoprazole 40 MG tablet  Commonly known as:  PROTONIX  Take 1 tablet (40 mg total) by mouth at bedtime.     vitamin B-12 100 MCG tablet  Commonly known as:  CYANOCOBALAMIN  Take 50 mcg by mouth daily.           Follow-up Information   Follow up with [facility], MD. Schedule an appointment as soon as possible for a visit in 3 weeks.   Contact information:   [address] [place] [address] [phone]       Follow up with [facility], MD In 1 week.   Contact information:   [address] [place] [address] [phone]       Follow up with [facility]. (in [place] at 11:20)        The results of significant diagnostics from this hospitalization (including imaging, microbiology, ancillary and laboratory) are listed below for reference.    Significant Diagnostic Studies: Dg Chest 2 View  06/19/2012  *RADIOLOGY REPORT*  Clinical Data: Cough, fever   CHEST - 2 VIEW  Comparison: 09/22/2011  Findings: Lungs are clear. No pleural effusion or pneumothorax.  Mild cardiomegaly.  Mild degenerative changes of the visualized thoracolumbar spine.  IMPRESSION: No evidence of acute cardiopulmonary disease.   Original Report Authenticated By: Charline Bills, M.D.    Dg Abd 1 View  07/05/2012  *RADIOLOGY  REPORT*  Clinical Data: Peritoneal dialysis catheter not draining.  Evaluate for position of catheter.  Question constipation.  ABDOMEN - 1 VIEW  Comparison: CT of 07/01/2012  Findings: Multiple supine views, performed portably.  These are moderate to markedly degraded by patient body habitus.  Gas within normal caliber stomach.  Increased density within the right side abdomen is likely due to contrast within the ascending colon from prior CT.  A large bore catheter projects over the midabdomen.  This could represent the external course of the dialysis catheter.  The remainder of the catheter is not confidently identified.  There is a moderate amount of colonic stool.  The low pelvis is excluded.  Images are motion degraded.  IMPRESSION: Moderately degraded exam, secondary multiple factors detailed above.  Lack of confident visualization of the dialysis catheter.  Its external course may be identified, terminating over the left paracentral abdomen.  Moderate amount of ascending colonic stool.  Low pelvis excluded.   Original Report Authenticated By: Jeronimo Greaves, M.D.    Ct Abdomen Pelvis W Contrast  07/01/2012  *RADIOLOGY REPORT*  Clinical Data: Abdominal pain.  Peritoneal dialysis.  CT ABDOMEN AND PELVIS WITH CONTRAST  Technique:  Multidetector CT imaging of the abdomen and pelvis was performed following the standard protocol during bolus administration of intravenous contrast.  Contrast: OMNIPAQUE IOHEXOL 300 MG/ML  SOLN  Comparison: None.  Findings: There is cardiomegaly with slight bibasilar atelectasis, right more than left as well as pulmonary vascular  prominence. No pleural effusions.  The liver, spleen, pancreas, adrenal glands are normal.  Bilateral renal atrophy with non-enhancing small lesions in both kidneys, consistent with benign cysts.  Biliary tree is normal.  There are no dilated loops of large or small bowel.  Terminal ileum is normal.  Prostate gland is not enlarged.  There is moderate fluid in the abdomen consistent with the patient's peritoneal dialysis.  Peritoneal dialysis catheter is present.  IMPRESSION: No acute abnormality of the abdomen or pelvis. Peritoneal dialysate in the abdomen.   Original Report Authenticated By: Francene Boyers, M.D.    Ir Fluoro Guide Cv Line Right  07/13/2012  *RADIOLOGY REPORT*  Indication: In need of access for initiation of hemodialysis  TUNNELED CENTRAL VENOUS HEMODIALYSIS CATHETER PLACEMENT WITH ULTRASOUND AND FLUOROSCOPIC GUIDANCE  Medications: Versed 2 mg IV; Fentanyl 50 mcg IV; Ancef 2 gm IV; The IV antibiotic was given in an appropriate time interval prior to skin puncture.  Contrast: None  Total Moderate Sedation Time: 16 minutes.  Fluoroscopy Time: 1.1 minutes.  Complications:  None immediate  Findings / Procedure:  Informed written consent was obtained from the patient after a discussion of the risks, benefits, and alternatives to treatment. Questions regarding the procedure were encouraged and answered. The right neck and chest were prepped with chlorhexidine in a sterile fashion, and a sterile drape was applied covering the operative field.  Maximum barrier sterile technique with sterile gowns and gloves were used for the procedure.  A timeout was performed prior to the initiation of the procedure.  After creating a small venotomy incision, a micropuncture kit was utilized to access the right internal jugular vein under direct, real-time ultrasound guidance after the overlying soft tissues were anesthetized with 1% lidocaine with epinephrine.  Ultrasound image documentation was performed.  The  microwire was kinked to measure appropriate catheter length.  A stiff glidewire was advanced to the level of the IVC and the micropuncture sheath was exchanged for a peel-away sheath.  A hemosplit tunneled  hemodialysis catheter measuring 23 cm from tip to cuff was tunneled in a retrograde fashion from the anterior chest wall to the venotomy incision.  The catheter was then placed through the peel-away sheath with tips ultimately positioned within the superior aspect of the right atrium.  Final catheter positioning was confirmed and documented with a spot radiographic image.  The catheter aspirates and flushes normally.  The catheter was flushed with appropriate volume heparin dwells.  The catheter exit site was secured with a 0-Prolene retention suture.  The venotomy incision was closed with an interrupted 4-0 Vicryl, Dermabond and Steri-strips.  Dressings were applied.  The patient tolerated the procedure well without immediate post procedural complication.  IMPRESSION:  Successful placement of 23 cm tip to cuff tunneled hemodialysis catheter via the right internal jugular vein with tips terminating within the superior aspect of the right atrium.  The catheter is ready for immediate use.   Original Report Authenticated By: Tacey Ruiz, MD    Ir US Guide Vasc Access Right  07/13/2012  *RADIOLOGY REPORT*  Indication: In need of access for initiation of hemodialysis  TUNNELED CENTRAL VENOUS HEMODIALYSIS CATHETER PLACEMENT WITH ULTRASOUND AND FLUOROSCOPIC GUIDANCE  Medications: Versed 2 mg IV; Fentanyl 50 mcg IV; Ancef 2 gm IV; The IV antibiotic was given in an appropriate time interval prior to skin puncture.  Contrast: None  Total Moderate Sedation Time: 16 minutes.  Fluoroscopy Time: 1.1 minutes.  Complications:  None immediate  Findings / Procedure:  Informed written consent was obtained from the patient after a discussion of the risks, benefits, and alternatives to treatment. Questions regarding the procedure  were encouraged and answered. The right neck and chest were prepped with chlorhexidine in a sterile fashion, and a sterile drape was applied covering the operative field.  Maximum barrier sterile technique with sterile gowns and gloves were used for the procedure.  A timeout was performed prior to the initiation of the procedure.  After creating a small venotomy incision, a micropuncture kit was utilized to access the right internal jugular vein under direct, real-time ultrasound guidance after the overlying soft tissues were anesthetized with 1% lidocaine with epinephrine.  Ultrasound image documentation was performed.  The microwire was kinked to measure appropriate catheter length.  A stiff glidewire was advanced to the level of the IVC and the micropuncture sheath was exchanged for a peel-away sheath.  A hemosplit tunneled hemodialysis catheter measuring 23 cm from tip to cuff was tunneled in a retrograde fashion from the anterior chest wall to the venotomy incision.  The catheter was then placed through the peel-away sheath with tips ultimately positioned within the superior aspect of the right atrium.  Final catheter positioning was confirmed and documented with a spot radiographic image.  The catheter aspirates and flushes normally.  The catheter was flushed with appropriate volume heparin dwells.  The catheter exit site was secured with a 0-Prolene retention suture.  The venotomy incision was closed with an interrupted 4-0 Vicryl, Dermabond and Steri-strips.  Dressings were applied.  The patient tolerated the procedure well without immediate post procedural complication.  IMPRESSION:  Successful placement of 23 cm tip to cuff tunneled hemodialysis catheter via the right internal jugular vein with tips terminating within the superior aspect of the right atrium.  The catheter is ready for immediate use.   Original Report Authenticated By: Tacey Ruiz, MD    Dg Chest Port 1 View  07/11/2012  *RADIOLOGY  REPORT*  Clinical Data: Extubation  PORTABLE CHEST -  1 VIEW  Comparison: 07/10/2012  Findings: Low lung volumes.  Mild bibasilar atelectasis.  No pleural effusion or pneumothorax.  Stable right IJ venous catheter terminates in the lower SVC.  Mild cardiomegaly.  IMPRESSION: Low lung volumes with bibasilar atelectasis.   Original Report Authenticated By: Charline Bills, M.D.    Dg Chest Port 1 View  07/10/2012  *RADIOLOGY REPORT*  Clinical Data: Post extubation, stridor  PORTABLE CHEST - 1 VIEW  Comparison: Portable exam 0801 hours compared to 07/09/2012  Findings: Right jugular central venous catheter stable tip projecting over SVC. Nasogastric and endotracheal tubes no longer identified. Enlargement of cardiac silhouette. Persistent elevation of right diaphragm with right basilar atelectasis. Question minimal perihilar infiltrate versus edema. No gross segmental consolidation, pleural effusion or pneumothorax.  IMPRESSION: Mild right basilar atelectasis. Enlargement of cardiac silhouette with question residual perihilar edema or infiltrate.   Original Report Authenticated By: Ulyses Southward, M.D.    Dg Chest Port 1 View  07/09/2012  *RADIOLOGY REPORT*  Clinical Data: Respiratory failure and pulmonary edema.  PORTABLE CHEST - 1 VIEW  Comparison: 07/08/2012  Findings: Endotracheal tube tip projects approximately 1.5 cm above the carina.  Nasogastric tube extends below the diaphragm.  Large bore right jugular central venous catheter tip remains in the SVC. Lungs show improved volumes bilaterally with residual suggestion of mild edema and bibasilar atelectasis present.  No large pleural effusions are seen.  IMPRESSION: Improved aeration bilaterally with suspected residual mild edema.   Original Report Authenticated By: Irish Lack, M.D.    Dg Chest Port 1 View  07/08/2012  *RADIOLOGY REPORT*  Clinical Data: Check ET tube.  PORTABLE CHEST - 1 VIEW  Comparison: 07/06/2012  Findings: Endotracheal tube is 3 cm  above the carina.  Interval removal of the left central line.  Right central line and NG tube are stable.  There is cardiomegaly with vascular congestion.  Worsening bilateral airspace disease, likely edema.  Low lung volumes with bibasilar atelectasis.  IMPRESSION: Worsening pulmonary edema pattern/CHF.   Original Report Authenticated By: Charlett Nose, M.D.    Dg Chest Port 1 View  07/06/2012  *RADIOLOGY REPORT*  Clinical Data: Respiratory failure, follow  PORTABLE CHEST - 1 VIEW  Comparison: A portable chest x-ray of 07/05/2012  Findings: There is little change in poor aeration with mild pulmonary vascular congestion remaining.  Cardiomegaly is stable. Left and right central venous lines are unchanged in position.  No pneumothorax is noted.  IMPRESSION: Little change in poor aeration with cardiomegaly and pulmonary vascular congestion.   Original Report Authenticated By: Dwyane Dee, M.D.    Dg Chest Port 1 View  07/05/2012  *RADIOLOGY REPORT*  Clinical Data: Increased respiratory rate.  Question volume overload.  PORTABLE CHEST - 1 VIEW  Comparison: Plain film chest 07/04/2012.  Findings: Bilateral internal jugular central venous catheters are unchanged.  Lung volumes are low with basilar atelectasis, also unchanged.  No pulmonary edema is identified.  There is cardiomegaly.  IMPRESSION:  No acute abnormality with bibasilar atelectasis and cardiomegaly again seen.   Original Report Authenticated By: Holley Dexter, M.D.    Dg Chest Port 1 View  07/04/2012  *RADIOLOGY REPORT*  Clinical Data: Acute dyspnea.  PORTABLE CHEST - 1 VIEW  Comparison: 07/03/2012 and 07/02/2012  Findings: Central catheters are unchanged.  NG tube and endotracheal tube have been removed.  Chronic cardiomegaly. Pulmonary vascularity is within normal limits considering the shallow inspiration.  Slight atelectasis at the bases is essentially unchanged.  IMPRESSION: No acute abnormalities.  Slight atelectasis, stable.   Original Report  Authenticated By: Francene Boyers, M.D.    Dg Chest Port 1 View  07/03/2012  *RADIOLOGY REPORT*  Clinical Data: Respiratory failure.  Endotracheal tube position.  PORTABLE CHEST - 1 VIEW  Comparison: 07/02/2012.  Findings: 05:37 hours. The endotracheal tube, nasogastric tube and bilateral central lines are unchanged.  There are persistent low lung volumes with bibasilar atelectasis.  No pneumothorax or significant pleural effusion is identified.  The heart size and mediastinal contours are stable.  IMPRESSION: Stable chest.  No acute findings identified.   Original Report Authenticated By: Carey Bullocks, M.D.    Dg Chest Port 1 View  07/02/2012  *RADIOLOGY REPORT*  Clinical Data: 52 year old male with dialysis catheter placement.  PORTABLE CHEST - 1 VIEW  Comparison: 0846 hours the same day and earlier.  Findings: Interval right IJ approach dual lumen catheter placement. Tip is at the level of the carina.  Stable left IJ central line.  Endotracheal tube and visible enteric tube are stable.  No pneumothorax.  Stable lung volumes and ventilation.  Stable cardiac size and mediastinal contours.  IMPRESSION: 1.  Right IJ dual lumen catheter placed, tip at the level of the carina - SVC. No pneumothorax. 2. Otherwise, stable lines and tubes. 3.  Otherwise stable chest.   Original Report Authenticated By: Erskine Speed, M.D.    Dg Chest Port 1 View  07/02/2012  *RADIOLOGY REPORT*  Clinical Data:  Line placement  PORTABLE CHEST - 1 VIEW  Comparison: Portable exam 0846 hours compared to 07/01/2012  Findings: Tip of endotracheal tube projects 2.8 cm above carina. Nasogastric tube extends into stomach. New left jugular central venous catheter tip projecting over the right atrium; recommend withdrawal 2.0 cm. Enlargement of cardiac silhouette with pulmonary vascular congestion. Bibasilar opacities persist, atelectasis at right base with atelectasis versus consolidation left lower lobe. Upper lungs clear. No pneumothorax.   IMPRESSION: Tip of left jugular catheter projects over high right atrium; recommend withdrawal 2.0 cm. Persistent bibasilar opacities greater on left, unchanged. No pneumothorax following line placement.  Findings called to Joy RN on 2900 on 07/02/2012 at 0851 hours.   Original Report Authenticated By: Ulyses Southward, M.D.    Dg Chest Portable 1 View  07/01/2012  *RADIOLOGY REPORT*  Clinical Data: Endotracheal tube placement; abdominal pain.  PORTABLE CHEST - 1 VIEW  Comparison: Chest radiograph performed 06/19/2012  Findings: The patient's endotracheal tube is seen ending 2-3 cm above the carina.  The lungs are significantly hypoexpanded.  Patchy bibasilar airspace opacities may reflect atelectasis or possibly pneumonia. Vascular crowding and likely vascular congestion are seen.  A small left pleural effusion is suspected.  No pneumothorax is seen.  The cardiomediastinal silhouette is borderline normal in size.  No acute osseous abnormalities are identified.  IMPRESSION:  1.  Endotracheal tube seen ending 2-3 cm above the carina. 2.  Lungs significantly hypoexpanded.  Patchy bibasilar airspace opacities may reflect atelectasis or possibly pneumonia.  Likely vascular congestion and small left pleural effusion.   Original Report Authenticated By: Tonia Ghent, M.D.     Microbiology: Recent Results (from the past 240 hour(s))  FUNGUS CULTURE W SMEAR     Status: None   Collection Time    07/08/12  8:30 AM      Result Value Range Status   Specimen Description CATH TIP   Final   Special Requests POF ZOSYN   Final   Fungal Smear FEW YEAST   Final   Culture CULTURE  IN PROGRESS FOR FOUR WEEKS   Final   Report Status PENDING   Incomplete  CATH TIP CULTURE     Status: None   Collection Time    07/08/12  8:30 AM      Result Value Range Status   Specimen Description CATH TIP   Final   Special Requests POF ZOSYN   Final   Culture >100 COLONIES YEAST, NOT CANDIDA ALBICANS   Final   Report Status 07/11/2012  FINAL   Final  MRSA PCR SCREENING     Status: Abnormal   Collection Time    07/08/12 11:01 AM      Result Value Range Status   MRSA by PCR INVALID RESULTS, SPECIMEN SENT FOR CULTURE (*) NEGATIVE Final   Comment:            The GeneXpert MRSA Assay (FDA     approved for NASAL specimens     only), is one component of a     comprehensive MRSA colonization     surveillance program. It is not     intended to diagnose MRSA     infection nor to guide or     monitor treatment for     MRSA infections.  MRSA CULTURE     Status: None   Collection Time    07/08/12 11:01 AM      Result Value Range Status   Specimen Description NASAL SWAB   Final   Special Requests NONE MRSA PCR WAS INVALID TWICE   Final   Culture     Final   Value: NO STAPHYLOCOCCUS AUREUS ISOLATED     Note: NOMRSA   Report Status 07/10/2012 FINAL   Final     Labs: Basic Metabolic Panel:  Recent Labs Lab 07/10/12 0333 07/11/12 0350 07/12/12 0642 07/14/12 0758 07/16/12 0745  NA 141 140 142 139 140  K 4.8 4.4 3.5 3.9 4.4  CL 100 99 99 98 98  CO2 22 28 25 25 26   GLUCOSE 124* 138* 96 100* 100*  BUN 59* 46* 69* 56* 50*  CREATININE 13.98* 9.47* 12.43* 11.50* 11.39*  CALCIUM 9.2 9.3 8.8 8.8 9.1  PHOS  --  6.9* 9.1*  --   --    Liver Function Tests:  Recent Labs Lab 07/11/12 0350 07/12/12 0642  ALBUMIN 2.6* 2.4*   No results found for this basename: LIPASE, AMYLASE,  in the last 168 hours No results found for this basename: AMMONIA,  in the last 168 hours CBC:  Recent Labs Lab 07/10/12 0333 07/12/12 0642 07/14/12 0758 07/15/12 0449 07/16/12 0745  WBC 10.6* 9.2 9.8 10.4 10.2  HGB 7.6* 7.7* 8.1* 8.6* 8.9*  HCT 24.2* 24.7* 26.3* 27.7* 29.0*  MCV 90.6 90.5 91.0 91.4 91.5  PLT 176 215 212 227 224   Cardiac Enzymes: No results found for this basename: CKTOTAL, CKMB, CKMBINDEX, TROPONINI,  in the last 168 hours BNP: BNP (last 3 results)  Recent Labs  09/07/11 2000 06/19/12 1927  PROBNP 24858.0*  3703.0*   CBG:  Recent Labs Lab 07/12/12 1633 07/12/12 2114 07/13/12 0804 07/14/12 0758 07/14/12 1845  GLUCAP 116* 112* 99 96 92       Signed:  Sanye Ledesma  [facility] 07/16/2012, 8:50 PM

## 2012-07-16 NOTE — Progress Notes (Signed)
Physical Therapy Treatment Patient Details Name: Gary Rivera MRN: [id number] DOB: 02/15/1961 Today's Date: 07/16/2012 Time: 1100-1130 PT Time Calculation (min): 30 min  PT Assessment / Plan / Recommendation Comments on Treatment Session  Good progress today, with increased amb distance, and good performance of controlled breathing; Pt reports he has home O2 already setup, and will be having a sleep study soon; he states he plans to go to  Cardiac Rehab after dc; PTis in agreement; Recommend use of RW during gout flare to unweigh painful foot with amb; Pt states he already has a RW; No further PT needs identified Will sign off    Follow Up Recommendations  Home health PT;Supervision - Intermittent Cardiac Rehab     Does the patient have the potential to tolerate intense rehabilitation     Barriers to Discharge        Equipment Recommendations  Rolling walker with 5" wheels    Recommendations for Other Services    Frequency Min 3X/week   Plan Discharge plan needs to be updated    Precautions / Restrictions Precautions Precautions: None Precaution Comments: watch O2 sats with amb Other Brace/Splint: pt uses bil knee braces at home. Restrictions Weight Bearing Restrictions: No   Pertinent Vitals/Pain 4/10 R Foot pain; Cues to use RW to unweigh painful foot during amb; RN notified    Mobility  Bed Mobility Bed Mobility: Not assessed Details for Bed Mobility Assistance: EOB upon arrival Transfers Transfers: Sit to Stand;Stand to Sit Sit to Stand: 6: Modified independent (Device/Increase time) Stand to Sit: 6: Modified independent (Device/Increase time) Details for Transfer Assistance: smooth transitions Ambulation/Gait Ambulation/Gait Assistance: 5: Supervision;6: Modified independent (Device/Increase time) Ambulation Distance (Feet): 300 Feet Assistive device: Rolling walker Ambulation/Gait Assistance Details: Able to ambulate safely in room without assistive device;  Used RW for progressive amb more due to gout pain in R foot; O2 sats ranged 88% lowest to 94% highest with amb on Room Air; Took standing rest breaks with Pursed-lip Breathing helping O2 sats when they dropped below 90% Gait Pattern: Step-through pattern Stairs:  (Pt reports he has a ramp)    Exercises     PT Diagnosis:    PT Problem List:   PT Treatment Interventions:     PT Goals Acute Rehab PT Goals Time For Goal Achievement: 07/18/12 Potential to Achieve Goals: Good Pt will go Supine/Side to Sit: with modified independence;with HOB 0 degrees PT Goal: Supine/Side to Sit - Progress: Met Pt will go Sit to Supine/Side: with modified independence;with HOB 0 degrees PT Goal: Sit to Supine/Side - Progress: Met Pt will go Sit to Stand: with modified independence PT Goal: Sit to Stand - Progress: Met Pt will go Stand to Sit: with modified independence PT Goal: Stand to Sit - Progress: Met Pt will Ambulate: >150 feet;with modified independence;with rolling walker PT Goal: Ambulate - Progress: Met PT Goal: Up/Down Stairs - Progress:  (has ramp)  Visit Information  Last PT Received On: 07/16/12 Assistance Needed: +1    Subjective Data  Subjective: REports R foot pain which MD has said is gout; He has been working on his breathing Patient Stated Goal: to be able to go home at discharge   Cognition  Cognition Arousal/Alertness: Awake/alert Behavior During Therapy: WFL for tasks assessed/performed Overall Cognitive Status: Within Functional Limits for tasks assessed    Balance     End of Session PT - End of Session Activity Tolerance: Patient tolerated treatment well Patient left: with nursing in room (standing  in room) Nurse Communication: Mobility status   GP     Van Clines [facility] [place], [place] [phone] 07/16/2012, 1:24 PM

## 2012-07-16 NOTE — Progress Notes (Signed)
Patient had forgot to put his O2 on after coming back from dialysis, and on checking vital signs now was 78% saturation on room air.  His O2 was put back on @ 2liters and saturation came back to 100%.  Patient was suppose to discharge home via city bus, and with concern that patient might desat on the bus ride home, practitioner on-call was paged.  Patient is now to transport home via non-emergent ambulance where O2 need will be met in transport.  Patient is on chronic home O2.  He will be able to use home O2 when he gets home.

## 2012-07-16 NOTE — Progress Notes (Signed)
Pt refusing to wear BiPAP. States he is going home tonight.

## 2012-07-31 ENCOUNTER — Telehealth (INDEPENDENT_AMBULATORY_CARE_PROVIDER_SITE_OTHER): Payer: Self-pay | Admitting: General Surgery

## 2012-07-31 NOTE — Telephone Encounter (Signed)
Patient had peritoneal dialysis catheter removed on 07/08/2012. Calling today to get an appt to get his staples removed. They have been in 3 1/2 weeks, so I did not just want to make a nurse appt for patient. Please advise where he can be seen. [phone].

## 2012-08-01 ENCOUNTER — Telehealth (INDEPENDENT_AMBULATORY_CARE_PROVIDER_SITE_OTHER): Payer: Self-pay

## 2012-08-01 ENCOUNTER — Inpatient Hospital Stay: Payer: Medicare Other | Admitting: Infectious Disease

## 2012-08-01 NOTE — Telephone Encounter (Signed)
LMOV pt's po appt 08/03/12 at 10:00a.m.

## 2012-08-03 ENCOUNTER — Ambulatory Visit (INDEPENDENT_AMBULATORY_CARE_PROVIDER_SITE_OTHER): Payer: Medicare Other | Admitting: General Surgery

## 2012-08-03 ENCOUNTER — Encounter (INDEPENDENT_AMBULATORY_CARE_PROVIDER_SITE_OTHER): Payer: Self-pay | Admitting: General Surgery

## 2012-08-03 ENCOUNTER — Encounter (INDEPENDENT_AMBULATORY_CARE_PROVIDER_SITE_OTHER): Payer: Medicare Other | Admitting: General Surgery

## 2012-08-03 VITALS — BP 138/78 | HR 80 | Temp 97.1°F | Resp 20 | Ht 74.0 in | Wt 293.2 lb

## 2012-08-03 DIAGNOSIS — Z5189 Encounter for other specified aftercare: Secondary | ICD-10-CM

## 2012-08-03 DIAGNOSIS — K659 Peritonitis, unspecified: Secondary | ICD-10-CM

## 2012-08-03 NOTE — Patient Instructions (Signed)
From a standpoint of your abdomen, you have no restrictions.  You still should discuss restrictions regarding dialysis catheter with your nephrologist.  Follow up with [place] as needed.

## 2012-08-03 NOTE — Assessment & Plan Note (Signed)
No evidence of surgical complications.  Follow up as needed.  From abdomen standpoint, cleared of restrictions.

## 2012-08-03 NOTE — Progress Notes (Signed)
HISTORY: Patient doing well status post removal of peritoneal dialysis catheter. He has been getting hemodialysis. The catheter was removed as it was infected with Candida. He has been doing well postoperatively. He returns for staple removal. He denies fevers or chills. His dialysis is going well through a tunnelled dialysis catheter.  He is no longer having abdominal pain.    EXAM: General:  Alert and oriented Incision:  Well healed.  Staples removed, benzoin/steristrips placed.     PATHOLOGY: n/a   ASSESSMENT AND PLAN:   Peritonitis due to infected peritoneal dialysis catheter No evidence of surgical complications.  Follow up as needed.  From abdomen standpoint, cleared of restrictions.      Maudry Diego, MD Surgical Oncology, General & Endocrine Surgery [facility], Burtis Junes, MD [name], MD

## 2013-02-23 ENCOUNTER — Encounter (HOSPITAL_COMMUNITY): Payer: Self-pay | Admitting: Emergency Medicine

## 2013-02-23 DIAGNOSIS — N186 End stage renal disease: Secondary | ICD-10-CM | POA: Insufficient documentation

## 2013-02-23 DIAGNOSIS — Z862 Personal history of diseases of the blood and blood-forming organs and certain disorders involving the immune mechanism: Secondary | ICD-10-CM | POA: Insufficient documentation

## 2013-02-23 DIAGNOSIS — Z992 Dependence on renal dialysis: Secondary | ICD-10-CM | POA: Insufficient documentation

## 2013-02-23 DIAGNOSIS — I509 Heart failure, unspecified: Secondary | ICD-10-CM | POA: Insufficient documentation

## 2013-02-23 DIAGNOSIS — Z79899 Other long term (current) drug therapy: Secondary | ICD-10-CM | POA: Insufficient documentation

## 2013-02-23 DIAGNOSIS — K8020 Calculus of gallbladder without cholecystitis without obstruction: Secondary | ICD-10-CM | POA: Insufficient documentation

## 2013-02-23 DIAGNOSIS — Z7982 Long term (current) use of aspirin: Secondary | ICD-10-CM | POA: Insufficient documentation

## 2013-02-23 DIAGNOSIS — F411 Generalized anxiety disorder: Secondary | ICD-10-CM | POA: Insufficient documentation

## 2013-02-23 DIAGNOSIS — E875 Hyperkalemia: Secondary | ICD-10-CM | POA: Insufficient documentation

## 2013-02-23 DIAGNOSIS — I120 Hypertensive chronic kidney disease with stage 5 chronic kidney disease or end stage renal disease: Secondary | ICD-10-CM | POA: Insufficient documentation

## 2013-02-23 NOTE — ED Notes (Signed)
The pt arrived by ems from home;  The pt has had nv for 2 weeks.  Dialysis pt that was dialyzed Friday. Lt forearm fistula

## 2013-02-24 ENCOUNTER — Emergency Department (HOSPITAL_COMMUNITY)
Admission: EM | Admit: 2013-02-24 | Discharge: 2013-02-24 | Disposition: A | Discharge status: Home / Self Care | Payer: Medicare Other | Attending: Emergency Medicine | Admitting: Emergency Medicine

## 2013-02-24 ENCOUNTER — Other Ambulatory Visit: Payer: Self-pay

## 2013-02-24 ENCOUNTER — Emergency Department (HOSPITAL_COMMUNITY): Payer: Medicare Other

## 2013-02-24 DIAGNOSIS — K8050 Calculus of bile duct without cholangitis or cholecystitis without obstruction: Secondary | ICD-10-CM

## 2013-02-24 DIAGNOSIS — J383 Other diseases of vocal cords: Secondary | ICD-10-CM | POA: Diagnosis present

## 2013-02-24 DIAGNOSIS — E875 Hyperkalemia: Secondary | ICD-10-CM

## 2013-02-24 DIAGNOSIS — D649 Anemia, unspecified: Secondary | ICD-10-CM | POA: Diagnosis present

## 2013-02-24 DIAGNOSIS — N186 End stage renal disease: Secondary | ICD-10-CM | POA: Diagnosis present

## 2013-02-24 DIAGNOSIS — I10 Essential (primary) hypertension: Secondary | ICD-10-CM | POA: Diagnosis present

## 2013-02-24 DIAGNOSIS — I272 Pulmonary hypertension, unspecified: Secondary | ICD-10-CM | POA: Diagnosis present

## 2013-02-24 DIAGNOSIS — R112 Nausea with vomiting, unspecified: Secondary | ICD-10-CM | POA: Diagnosis present

## 2013-02-24 DIAGNOSIS — I517 Cardiomegaly: Secondary | ICD-10-CM | POA: Diagnosis present

## 2013-02-24 DIAGNOSIS — I5022 Chronic systolic (congestive) heart failure: Secondary | ICD-10-CM | POA: Diagnosis present

## 2013-02-24 LAB — CBC WITH DIFFERENTIAL/PLATELET
Basophils Absolute: 0 10*3/uL (ref 0.0–0.1)
Eosinophils Absolute: 0.2 10*3/uL (ref 0.0–0.7)
HCT: 40.4 % (ref 39.0–52.0)
Lymphs Abs: 1.6 10*3/uL (ref 0.7–4.0)
MCH: 30.3 pg (ref 26.0–34.0)
MCV: 97.1 fL (ref 78.0–100.0)
Neutrophils Relative %: 68 % (ref 43–77)
Platelets: 142 10*3/uL — ABNORMAL LOW (ref 150–400)
RBC: 4.16 MIL/uL — ABNORMAL LOW (ref 4.22–5.81)
RDW: 15 % (ref 11.5–15.5)
WBC: 7.5 10*3/uL (ref 4.0–10.5)

## 2013-02-24 LAB — HEPATIC FUNCTION PANEL
ALT: 6 U/L (ref 0–53)
Albumin: 3.8 g/dL (ref 3.5–5.2)
Indirect Bilirubin: 0.3 mg/dL (ref 0.3–0.9)
Total Protein: 7.8 g/dL (ref 6.0–8.3)

## 2013-02-24 LAB — BASIC METABOLIC PANEL
CO2: 24 mEq/L (ref 19–32)
GFR calc Af Amer: 3 mL/min — ABNORMAL LOW (ref >90)
GFR calc non Af Amer: 3 mL/min — ABNORMAL LOW (ref >90)
Glucose, Bld: 93 mg/dL (ref 70–99)
Potassium: 5.5 mEq/L — ABNORMAL HIGH (ref 3.5–5.1)
Sodium: 140 mEq/L (ref 135–145)

## 2013-02-24 LAB — POTASSIUM: Potassium: 6 mEq/L — ABNORMAL HIGH (ref 3.5–5.1)

## 2013-02-24 LAB — LIPASE, BLOOD: Lipase: 69 U/L — ABNORMAL HIGH (ref 11–59)

## 2013-02-24 MED ORDER — ONDANSETRON 4 MG PO TBDP: ORAL_TABLET | ORAL | Status: DC

## 2013-02-24 MED ORDER — SODIUM POLYSTYRENE SULFONATE 15 GM/60ML PO SUSP
30.0000 g | Freq: Once | ORAL | Status: AC
  Administered 2013-02-24: 30 g via ORAL
  Filled 2013-02-24: qty 120

## 2013-02-24 MED ORDER — SODIUM POLYSTYRENE SULFONATE PO POWD: Freq: Every day | ORAL | Status: DC

## 2013-02-24 NOTE — ED Provider Notes (Signed)
CSN: [id number]     Arrival date & time 02/23/13  2344 History   First MD Initiated Contact with Patient 02/24/13 0030     Chief Complaint  Patient presents with  . Emesis   (Consider location/radiation/quality/duration/timing/severity/associated sxs/prior Treatment) HPI Several episodes of vomiting every day for the past 2 weeks. His dialysis patient gets dialysis 3 times a week. Last dialysis was on Friday. He denies any fevers or chills. He denies any lightheadedness or dizziness. He has had normal bowel movements without diarrhea or constipation. He denies any blood or coffee ground emesis. She's had no abdominal pain. Patient currently is asymptomatic. Past Medical History  Diagnosis Date  . Hypertension   . Renal disorder   . CHF (congestive heart failure)   . Peritoneal dialysis catheter in place   . Anemia     patient reporats that last hgb 7.9 a week ago  . Anxiety state, unspecified    Past Surgical History  Procedure Laterality Date  . Appendectomy    . Peritoneal catheter insertion     Family History  Problem Relation Age of Onset  . Diabetes Mother   . Hypertension Mother   . Hypertension Sister   . Asthma Sister    History  Substance Use Topics  . Smoking status: Never Smoker   . Smokeless tobacco: Never Used  . Alcohol Use: No    Review of Systems  Constitutional: Negative for fever and chills.  Respiratory: Negative for cough and shortness of breath.   Cardiovascular: Negative for chest pain.  Gastrointestinal: Positive for nausea and vomiting. Negative for abdominal pain, diarrhea, constipation and blood in stool.  Musculoskeletal: Negative for back pain and myalgias.  Skin: Negative for rash and wound.  Neurological: Negative for dizziness, weakness, light-headedness, numbness and headaches.  All other systems reviewed and are negative.    Allergies  Renvela  Home Medications   Current Outpatient Rx  Name  Route  Sig  Dispense  Refill  .  albuterol (PROVENTIL HFA;VENTOLIN HFA) 108 (90 BASE) MCG/ACT inhaler   Inhalation   Inhale 1 puff into the lungs every 6 (six) hours as needed for wheezing or shortness of breath.         Marland Kitchen aspirin EC 81 MG tablet   Oral   Take 81 mg by mouth daily.         . bisoprolol (ZEBETA) 5 MG tablet   Oral   Take 0.5 tablets (2.5 mg total) by mouth daily. DO not take on Dialysis days         . clonazePAM (KLONOPIN) 0.5 MG tablet   Oral   Take 0.5 mg by mouth 2 (two) times daily as needed. anxiety         . famotidine (PEPCID) 20 MG tablet   Oral   Take 20 mg by mouth at bedtime as needed for heartburn.         . lanthanum (FOSRENOL) 1000 MG chewable tablet   Oral   Chew 1,000 mg by mouth 3 (three) times daily with meals.         . Multiple Vitamin (MULTIVITAMIN) tablet   Oral   Take 1 tablet by mouth daily.         Marland Kitchen oxyCODONE-acetaminophen (PERCOCET/ROXICET) 5-325 MG per tablet   Oral   Take 1-2 tablets by mouth every 4 (four) hours as needed (severe pain).   30 tablet   0   . vitamin B-12 (CYANOCOBALAMIN) 100 MCG tablet  Oral   Take 100 mcg by mouth daily.          BP 116/71  Pulse 70  Temp(Src) 98.6 F (37 C) (Oral)  Resp 10  Ht 6\' 2"  (1.88 m)  Wt 295 lb (133.811 kg)  BMI 37.86 kg/m2  SpO2 93% Physical Exam  Nursing note and vitals reviewed. Constitutional: He is oriented to person, place, and time. He appears well-developed and well-nourished. No distress.  HENT:  Head: Normocephalic and atraumatic.  Mildly dry mucous membranes.  Eyes: EOM are normal. Pupils are equal, round, and reactive to light.  Neck: Normal range of motion. Neck supple.  Cardiovascular: Normal rate and regular rhythm.  Exam reveals no gallop and no friction rub.   No murmur heard. Pulmonary/Chest: Effort normal and breath sounds normal. No respiratory distress. He has no wheezes. He has no rales. He exhibits no tenderness.  Abdominal: Soft. Bowel sounds are normal. He  exhibits no distension and no mass. There is no tenderness. There is no rebound and no guarding.  Musculoskeletal: Normal range of motion. He exhibits no edema and no tenderness.  Dialysis fistula left forearm. Palpable thrill. No evidence of erythema, swelling or warmth  Neurological: He is alert and oriented to person, place, and time.  Nasal extremities without deficit. Sensation grossly intact to  Skin: Skin is warm and dry. No rash noted. No erythema.  Overall mildly dry appearing  Psychiatric: He has a normal mood and affect. His behavior is normal.    ED Course  Procedures (including critical care time) Labs Review Labs Reviewed  CBC WITH DIFFERENTIAL - Abnormal; Notable for the following:    RBC 4.16 (*)    Hemoglobin 12.6 (*)    Platelets 142 (*)    All other components within normal limits  BASIC METABOLIC PANEL - Abnormal; Notable for the following:    Potassium 5.5 (*)    BUN 69 (*)    Creatinine, Ser 16.91 (*)    GFR calc non Af Amer 3 (*)    GFR calc Af Amer 3 (*)    All other components within normal limits  LIPASE, BLOOD  POTASSIUM   Imaging Review No results found.  EKG Interpretation    Date/Time:  Sunday February 24 2013 01:18:39 EST Ventricular Rate:  70 PR Interval:  154 QRS Duration: 93 QT Interval:  407 QTC Calculation: 439 R Axis:   8 Text Interpretation:  Sinus rhythm Abnormal T, consider ischemia, lateral leads Baseline wander in lead(s) V3 Confirmed by Ranae Palms  MD, Jacqualynn Parco ([id number]) on 02/24/2013 7:43:38 AM            MDM    Discussed with Dr. Valaria Good who recommends patient being discharged home and followed up with his normally scheduled hemodialysis tomorrow. He recommends starting the patient on 30 g of Kayexalate daily until he can be dialyzed.  Patient been giving him followup with central cryosurgery for biliary colic. He is wearing is to return for worsening pain, persistent vomiting, fever or any concerns.  [name],  MD 02/24/13 [phone]

## 2013-02-24 NOTE — ED Notes (Signed)
Patient discharged to home with family. NAD.  

## 2013-02-24 NOTE — Plan of Care (Signed)
Potassium of 6, patient initially presented with nausea and vomiting and are not acute abdomen per the ER physician. He was going to be discharged home by the ER physician except for the potassium of 6. Patient is end-stage renal disease and is on Monday Wednesday Friday, case was discussed by me with the ER physician and by the renal physician on-call doctor shertz . Patient to be discharged home and dialyze tomorrow, this is unremarkable potassium in the light of his ESRD. Nausea vomiting being managed by the ER physician. The ER physician patient is better from that standpoint. Abdomen was nonacute.

## 2013-02-24 NOTE — ED Notes (Signed)
2 IV attempts by ED [facility] RN.

## 2013-04-29 ENCOUNTER — Emergency Department (HOSPITAL_COMMUNITY)
Admission: EM | Admit: 2013-04-29 | Discharge: 2013-04-29 | Disposition: A | Discharge status: Home / Self Care | Payer: Medicare Other | Attending: Emergency Medicine | Admitting: Emergency Medicine

## 2013-04-29 ENCOUNTER — Encounter (HOSPITAL_COMMUNITY): Payer: Self-pay | Admitting: Emergency Medicine

## 2013-04-29 ENCOUNTER — Emergency Department (HOSPITAL_COMMUNITY): Payer: Medicare Other

## 2013-04-29 ENCOUNTER — Encounter (INDEPENDENT_AMBULATORY_CARE_PROVIDER_SITE_OTHER): Payer: Medicare Other | Admitting: General Surgery

## 2013-04-29 DIAGNOSIS — N186 End stage renal disease: Secondary | ICD-10-CM | POA: Insufficient documentation

## 2013-04-29 DIAGNOSIS — Z7982 Long term (current) use of aspirin: Secondary | ICD-10-CM | POA: Insufficient documentation

## 2013-04-29 DIAGNOSIS — R062 Wheezing: Secondary | ICD-10-CM | POA: Insufficient documentation

## 2013-04-29 DIAGNOSIS — Z992 Dependence on renal dialysis: Secondary | ICD-10-CM | POA: Insufficient documentation

## 2013-04-29 DIAGNOSIS — R1110 Vomiting, unspecified: Secondary | ICD-10-CM

## 2013-04-29 DIAGNOSIS — I120 Hypertensive chronic kidney disease with stage 5 chronic kidney disease or end stage renal disease: Secondary | ICD-10-CM | POA: Insufficient documentation

## 2013-04-29 DIAGNOSIS — F411 Generalized anxiety disorder: Secondary | ICD-10-CM | POA: Insufficient documentation

## 2013-04-29 DIAGNOSIS — Z79899 Other long term (current) drug therapy: Secondary | ICD-10-CM | POA: Insufficient documentation

## 2013-04-29 DIAGNOSIS — R0602 Shortness of breath: Secondary | ICD-10-CM | POA: Insufficient documentation

## 2013-04-29 DIAGNOSIS — Z862 Personal history of diseases of the blood and blood-forming organs and certain disorders involving the immune mechanism: Secondary | ICD-10-CM | POA: Insufficient documentation

## 2013-04-29 LAB — CBC WITH DIFFERENTIAL/PLATELET
BASOS ABS: 0 10*3/uL (ref 0.0–0.1)
BASOS PCT: 0 % (ref 0–1)
EOS PCT: 2 % (ref 0–5)
Eosinophils Absolute: 0.1 10*3/uL (ref 0.0–0.7)
HCT: 36.6 % — ABNORMAL LOW (ref 39.0–52.0)
Hemoglobin: 11.8 g/dL — ABNORMAL LOW (ref 13.0–17.0)
Lymphocytes Relative: 13 % (ref 12–46)
Lymphs Abs: 1 10*3/uL (ref 0.7–4.0)
MCH: 30.8 pg (ref 26.0–34.0)
MCHC: 32.2 g/dL (ref 30.0–36.0)
MCV: 95.6 fL (ref 78.0–100.0)
Monocytes Absolute: 0.5 10*3/uL (ref 0.1–1.0)
Monocytes Relative: 6 % (ref 3–12)
Neutro Abs: 5.9 10*3/uL (ref 1.7–7.7)
Neutrophils Relative %: 79 % — ABNORMAL HIGH (ref 43–77)
PLATELETS: 148 10*3/uL — AB (ref 150–400)
RBC: 3.83 MIL/uL — ABNORMAL LOW (ref 4.22–5.81)
RDW: 15.3 % (ref 11.5–15.5)
WBC: 7.5 10*3/uL (ref 4.0–10.5)

## 2013-04-29 LAB — HEPATIC FUNCTION PANEL
ALT: 11 U/L (ref 0–53)
AST: 39 U/L — ABNORMAL HIGH (ref 0–37)
Albumin: 3.7 g/dL (ref 3.5–5.2)
Alkaline Phosphatase: 82 U/L (ref 39–117)
Total Bilirubin: 0.3 mg/dL (ref 0.3–1.2)
Total Protein: 8.1 g/dL (ref 6.0–8.3)

## 2013-04-29 LAB — POCT I-STAT TROPONIN I
Troponin i, poc: 0.05 ng/mL (ref 0.00–0.08)
Troponin i, poc: 0.05 ng/mL (ref 0.00–0.08)

## 2013-04-29 LAB — POCT I-STAT, CHEM 8
BUN: 116 mg/dL — ABNORMAL HIGH (ref 6–23)
CALCIUM ION: 0.91 mmol/L — AB (ref 1.12–1.23)
Chloride: 104 mEq/L (ref 96–112)
Creatinine, Ser: 18.6 mg/dL — ABNORMAL HIGH (ref 0.50–1.35)
Glucose, Bld: 99 mg/dL (ref 70–99)
HCT: 39 % (ref 39.0–52.0)
Hemoglobin: 13.3 g/dL (ref 13.0–17.0)
POTASSIUM: 6.5 meq/L — AB (ref 3.7–5.3)
Sodium: 142 mEq/L (ref 137–147)
TCO2: 28 mmol/L (ref 0–100)

## 2013-04-29 LAB — PRO B NATRIURETIC PEPTIDE: PRO B NATRI PEPTIDE: 3309 pg/mL — AB (ref 0–125)

## 2013-04-29 LAB — POTASSIUM: Potassium: 3.9 mEq/L (ref 3.7–5.3)

## 2013-04-29 MED ORDER — ALBUTEROL SULFATE (2.5 MG/3ML) 0.083% IN NEBU
5.0000 mg | INHALATION_SOLUTION | Freq: Once | RESPIRATORY_TRACT | Status: AC
  Administered 2013-04-29: 5 mg via RESPIRATORY_TRACT
  Filled 2013-04-29: qty 6

## 2013-04-29 MED ORDER — IOHEXOL 350 MG/ML SOLN
100.0000 mL | Freq: Once | INTRAVENOUS | Status: AC | PRN
  Administered 2013-04-29: 200 mL via INTRAVENOUS

## 2013-04-29 MED ORDER — METOCLOPRAMIDE HCL 10 MG PO TABS: 10.0000 mg | ORAL_TABLET | Freq: Four times a day (QID) | ORAL | Status: DC | PRN

## 2013-04-29 MED ORDER — ONDANSETRON HCL 4 MG/2ML IJ SOLN
4.0000 mg | Freq: Once | INTRAMUSCULAR | Status: AC
  Administered 2013-04-29: 4 mg via INTRAVENOUS
  Filled 2013-04-29: qty 2

## 2013-04-29 NOTE — ED Provider Notes (Signed)
CSN: [id number]     Arrival date & time 04/29/13  0331 History   First MD Initiated Contact with Patient 04/29/13 [phone]     Chief Complaint  Patient presents with  . Shortness of Breath  . Vomiting   (Consider location/radiation/quality/duration/timing/severity/associated sxs/prior Treatment) Patient is a 53 y.o. male presenting with shortness of breath. The history is provided by the patient.  Shortness of Breath Severity:  Moderate Onset quality:  Sudden Timing:  Constant Progression:  Unchanged Chronicity:  Recurrent Context: not activity   Context comment:  Vomiting on dialysis Relieved by:  Nothing Worsened by:  Nothing tried Ineffective treatments:  None tried Associated symptoms: no abdominal pain and no fever   Risk factors: no recent alcohol use   States it is due to biliary disease.    Past Medical History  Diagnosis Date  . Hypertension   . Renal disorder   . CHF (congestive heart failure)   . Peritoneal dialysis catheter in place   . Anemia     patient reporats that last hgb 7.9 a week ago  . Anxiety state, unspecified    Past Surgical History  Procedure Laterality Date  . Appendectomy    . Peritoneal catheter insertion     Family History  Problem Relation Age of Onset  . Diabetes Mother   . Hypertension Mother   . Hypertension Sister   . Asthma Sister    History  Substance Use Topics  . Smoking status: Never Smoker   . Smokeless tobacco: Never Used  . Alcohol Use: No    Review of Systems  Constitutional: Negative for fever.  Respiratory: Positive for shortness of breath.   Gastrointestinal: Negative for abdominal pain.  All other systems reviewed and are negative.    Allergies  Renvela  Home Medications   Current Outpatient Rx  Name  Route  Sig  Dispense  Refill  . albuterol (PROVENTIL HFA;VENTOLIN HFA) 108 (90 BASE) MCG/ACT inhaler   Inhalation   Inhale 1 puff into the lungs every 6 (six) hours as needed for wheezing or shortness of  breath.         Marland Kitchen. aspirin EC 81 MG tablet   Oral   Take 81 mg by mouth daily.         . bisoprolol (ZEBETA) 5 MG tablet   Oral   Take 0.5 tablets (2.5 mg total) by mouth daily. DO not take on Dialysis days         . clonazePAM (KLONOPIN) 0.5 MG tablet   Oral   Take 0.5 mg by mouth 2 (two) times daily as needed. anxiety         . famotidine (PEPCID) 20 MG tablet   Oral   Take 20 mg by mouth at bedtime as needed for heartburn.         . lanthanum (FOSRENOL) 1000 MG chewable tablet   Oral   Chew 1,000 mg by mouth 3 (three) times daily with meals.         . Multiple Vitamin (MULTIVITAMIN) tablet   Oral   Take 1 tablet by mouth daily.         . ondansetron (ZOFRAN ODT) 4 MG disintegrating tablet      4mg  ODT q4 hours prn nausea/vomit   8 tablet   0   . oxyCODONE-acetaminophen (PERCOCET/ROXICET) 5-325 MG per tablet   Oral   Take 1-2 tablets by mouth every 4 (four) hours as needed (severe pain).   30 tablet  0   . sodium polystyrene (KAYEXALATE) powder   Oral   Take by mouth daily. Take 30 grams PO daily until you are dialyzed.   454 g   0   . vitamin B-12 (CYANOCOBALAMIN) 100 MCG tablet   Oral   Take 100 mcg by mouth daily.          There were no vitals taken for this visit. Physical Exam  Constitutional: He is oriented to person, place, and time. He appears well-developed and well-nourished. No distress.  HENT:  Head: Normocephalic and atraumatic.  Mouth/Throat: Oropharynx is clear and moist.  Eyes: Conjunctivae are normal. Pupils are equal, round, and reactive to light.  Neck: Normal range of motion. Neck supple.  Cardiovascular: Normal rate, regular rhythm and intact distal pulses.   Pulmonary/Chest: He has wheezes.  Abdominal: Soft. Bowel sounds are normal. There is no tenderness. There is no rebound and no guarding.  Musculoskeletal: Normal range of motion.  Neurological: He is alert and oriented to person, place, and time.  Skin: Skin is  warm and dry.  Psychiatric: He has a normal mood and affect.    ED Course  Procedures (including critical care time) Labs Review Labs Reviewed  CBC WITH DIFFERENTIAL  PRO B NATRIURETIC PEPTIDE  HEPATIC FUNCTION PANEL   Imaging Review No results found.  EKG Interpretation   None       MDM  No diagnosis found. Will sign out to Dr. Bernette Mayers pending second troponin if negative will need his dialysis this am.      Supreme Rybarczyk K Ronesha Heenan-Rasch, MD 04/29/13 [phone]

## 2013-04-29 NOTE — ED Notes (Signed)
Pt able to tolerated po liquid and snack well with out any problem, denies any nausea now.

## 2013-04-29 NOTE — ED Notes (Addendum)
duplicate

## 2013-04-29 NOTE — ED Notes (Signed)
Family at bedside. 

## 2013-04-29 NOTE — ED Notes (Signed)
Per EMS, pt complaint of SOB following vomiting this morning.  Pt is a dialysis Pt with access in his left upper arm.

## 2013-04-29 NOTE — ED Notes (Signed)
Patient transported to CT 

## 2013-04-29 NOTE — Discharge Instructions (Signed)
Nausea and Vomiting °Nausea means you feel sick to your stomach. Throwing up (vomiting) is a reflex where stomach contents come out of your mouth. °HOME CARE  °· Take medicine as told by your doctor. °· Do not force yourself to eat. However, you do need to drink fluids. °· If you feel like eating, eat a normal diet as told by your doctor. °· Eat rice, wheat, potatoes, bread, lean meats, yogurt, fruits, and vegetables. °· Avoid high-fat foods. °· Drink enough fluids to keep your pee (urine) clear or pale yellow. °· Ask your doctor how to replace body fluid losses (rehydrate). Signs of body fluid loss (dehydration) include: °· Feeling very thirsty. °· Dry lips and mouth. °· Feeling dizzy. °· Dark pee. °· Peeing less than normal. °· Feeling confused. °· Fast breathing or heart rate. °GET HELP RIGHT AWAY IF:  °· You have blood in your throw up. °· You have black or bloody poop (stool). °· You have a bad headache or stiff neck. °· You feel confused. °· You have bad belly (abdominal) pain. °· You have chest pain or trouble breathing. °· You do not pee at least once every 8 hours. °· You have cold, clammy skin. °· You keep throwing up after 24 to 48 hours. °· You have a fever. °MAKE SURE YOU:  °· Understand these instructions. °· Will watch your condition. °· Will get help right away if you are not doing well or get worse. °Document Released: 08/24/2007 Document Revised: 05/30/2011 Document Reviewed: 08/06/2010 °[employer]® Patient Information ©2014 [employer]. ° °

## 2013-04-29 NOTE — ED Notes (Signed)
Pt returned to room from CT

## 2013-04-29 NOTE — ED Notes (Signed)
Patient transported to X-ray 

## 2013-04-29 NOTE — ED Notes (Signed)
Critical K+ level called to Dr Harley Hallmark, see new order's

## 2013-05-08 ENCOUNTER — Encounter (HOSPITAL_COMMUNITY): Payer: Self-pay | Admitting: Emergency Medicine

## 2013-05-08 ENCOUNTER — Emergency Department (HOSPITAL_COMMUNITY): Payer: Medicare Other

## 2013-05-08 ENCOUNTER — Ambulatory Visit (HOSPITAL_COMMUNITY)
Admission: EM | Admit: 2013-05-08 | Discharge: 2013-05-09 | Disposition: A | Discharge status: Home / Self Care | Payer: Medicare Other | Attending: Emergency Medicine | Admitting: Emergency Medicine

## 2013-05-08 DIAGNOSIS — F411 Generalized anxiety disorder: Secondary | ICD-10-CM | POA: Insufficient documentation

## 2013-05-08 DIAGNOSIS — Z992 Dependence on renal dialysis: Secondary | ICD-10-CM | POA: Insufficient documentation

## 2013-05-08 DIAGNOSIS — I120 Hypertensive chronic kidney disease with stage 5 chronic kidney disease or end stage renal disease: Secondary | ICD-10-CM | POA: Insufficient documentation

## 2013-05-08 DIAGNOSIS — D638 Anemia in other chronic diseases classified elsewhere: Secondary | ICD-10-CM | POA: Insufficient documentation

## 2013-05-08 DIAGNOSIS — R0602 Shortness of breath: Secondary | ICD-10-CM | POA: Insufficient documentation

## 2013-05-08 DIAGNOSIS — N186 End stage renal disease: Secondary | ICD-10-CM | POA: Insufficient documentation

## 2013-05-08 DIAGNOSIS — I509 Heart failure, unspecified: Secondary | ICD-10-CM | POA: Insufficient documentation

## 2013-05-08 LAB — CBC
HEMATOCRIT: 32 % — AB (ref 39.0–52.0)
Hemoglobin: 10.2 g/dL — ABNORMAL LOW (ref 13.0–17.0)
MCH: 30.6 pg (ref 26.0–34.0)
MCHC: 31.9 g/dL (ref 30.0–36.0)
MCV: 96.1 fL (ref 78.0–100.0)
Platelets: 116 10*3/uL — ABNORMAL LOW (ref 150–400)
RBC: 3.33 MIL/uL — AB (ref 4.22–5.81)
RDW: 14.8 % (ref 11.5–15.5)
WBC: 5.1 10*3/uL (ref 4.0–10.5)

## 2013-05-08 LAB — HEPATITIS B SURFACE ANTIGEN: Hepatitis B Surface Ag: NEGATIVE

## 2013-05-08 LAB — BASIC METABOLIC PANEL
BUN: 96 mg/dL — ABNORMAL HIGH (ref 6–23)
CO2: 23 mEq/L (ref 19–32)
Calcium: 7.4 mg/dL — ABNORMAL LOW (ref 8.4–10.5)
Chloride: 103 mEq/L (ref 96–112)
Creatinine, Ser: 19.84 mg/dL — ABNORMAL HIGH (ref 0.50–1.35)
GFR calc Af Amer: 3 mL/min — ABNORMAL LOW (ref >90)
GFR calc non Af Amer: 2 mL/min — ABNORMAL LOW (ref >90)
GLUCOSE: 95 mg/dL (ref 70–99)
POTASSIUM: 4.1 meq/L (ref 3.7–5.3)
Sodium: 147 mEq/L (ref 137–147)

## 2013-05-08 NOTE — ED Notes (Signed)
SAA at bedside collecting blood.

## 2013-05-08 NOTE — ED Provider Notes (Signed)
CSN: [id number]     Arrival date & time 05/08/13  1137 History   First MD Initiated Contact with Patient 05/08/13 1507     Chief Complaint  Patient presents with  . Needs dialysis      (Consider location/radiation/quality/duration/timing/severity/associated sxs/prior Treatment) HPI Comments: Gary Rivera is a 53 y.o. year-old male with a past medical history of ESRD, HTN, CHF, presenting the Emergency Department with a chief complaint of needs dialysis.  He reports last dialyzed 4 days ago. The patient reports he is dialyzed T/TH/Sat, recently switched from M/W/F.  He states that he was unable to go to dialysis due to the inclement weather and the transportation company was closed. He reports increase in fatigue and tiredness. The patient states his dry weight is 291 lbs.  He denies chest pain, palpitations, shortness of breath.   The history is provided by the patient and medical records. No language interpreter was used.    Past Medical History  Diagnosis Date  . Hypertension   . Renal disorder   . CHF (congestive heart failure)   . Peritoneal dialysis catheter in place   . Anemia     patient reporats that last hgb 7.9 a week ago  . Anxiety state, unspecified    Past Surgical History  Procedure Laterality Date  . Appendectomy    . Peritoneal catheter insertion     Family History  Problem Relation Age of Onset  . Diabetes Mother   . Hypertension Mother   . Hypertension Sister   . Asthma Sister    History  Substance Use Topics  . Smoking status: Never Smoker   . Smokeless tobacco: Never Used  . Alcohol Use: No    Review of Systems  Constitutional: Negative for fever and chills.  Respiratory: Negative for cough and shortness of breath.   Cardiovascular: Negative for palpitations and leg swelling.  Gastrointestinal: Negative for nausea, vomiting, abdominal pain and diarrhea.  Neurological: Positive for weakness. Negative for tremors and numbness.       Allergies  Renvela  Home Medications   Current Outpatient Rx  Name  Route  Sig  Dispense  Refill  . albuterol (PROVENTIL HFA;VENTOLIN HFA) 108 (90 BASE) MCG/ACT inhaler   Inhalation   Inhale 1 puff into the lungs every 6 (six) hours as needed for wheezing or shortness of breath.         Marland Kitchen. aspirin EC 81 MG tablet   Oral   Take 81 mg by mouth daily.         . bisoprolol (ZEBETA) 5 MG tablet   Oral   Take 2.5 mg by mouth daily.         . calcitRIOL (ROCALTROL) 0.25 MCG capsule   Oral   Take 0.25 mcg by mouth daily.         . clonazePAM (KLONOPIN) 0.5 MG tablet   Oral   Take 0.5 mg by mouth 2 (two) times daily as needed. anxiety         . furosemide (LASIX) 80 MG tablet   Oral   Take 80 mg by mouth 2 (two) times daily.         Marland Kitchen. lanthanum (FOSRENOL) 1000 MG chewable tablet   Oral   Chew 1,000 mg by mouth 3 (three) times daily with meals. Twice daily with meals and 1 with snacks         . metoCLOPramide (REGLAN) 10 MG tablet   Oral   Take 1 tablet (10  mg total) by mouth every 6 (six) hours as needed for nausea (nausea/headache).   6 tablet   0   . omeprazole (PRILOSEC) 20 MG capsule   Oral   Take 20 mg by mouth 2 (two) times daily before a meal.         . oxyCODONE-acetaminophen (PERCOCET/ROXICET) 5-325 MG per tablet   Oral   Take 1-2 tablets by mouth every 4 (four) hours as needed (severe pain).   30 tablet   0    BP 131/73  Pulse 71  Temp(Src) 98.4 F (36.9 C) (Oral)  Resp 16  Wt 315 lb 7.7 oz (143.1 kg)  SpO2 92% Physical Exam  Nursing note and vitals reviewed. Constitutional: He is oriented to person, place, and time. He appears well-developed and well-nourished. No distress.  HENT:  Head: Normocephalic and atraumatic.  Eyes: EOM are normal. Pupils are equal, round, and reactive to light.  Neck: Neck supple.  Cardiovascular: Normal rate and regular rhythm.   Pulses:      Radial pulses are 2+ on the right side, and 2+ on the  left side.  No lower extremity edema. Left forearm Hemodialysis shunt with good thrill   Pulmonary/Chest: Effort normal. Not tachypneic. No respiratory distress. He has rales.  Abdominal: Soft. He exhibits no distension. There is no tenderness. There is no rebound and no guarding.  Musculoskeletal: Normal range of motion.  Neurological: He is alert and oriented to person, place, and time.  Skin: Skin is warm and dry. He is not diaphoretic.  Psychiatric: He has a normal mood and affect. His behavior is normal.    ED Course  Procedures (including critical care time) Labs Review Labs Reviewed  CBC - Abnormal; Notable for the following:    RBC 3.33 (*)    Hemoglobin 10.2 (*)    HCT 32.0 (*)    Platelets 116 (*)    All other components within normal limits  BASIC METABOLIC PANEL - Abnormal; Notable for the following:    BUN 96 (*)    Creatinine, Ser 19.84 (*)    Calcium 7.4 (*)    GFR calc non Af Amer 2 (*)    GFR calc Af Amer 3 (*)    All other components within normal limits  HEPATITIS B SURFACE ANTIGEN   Imaging Review No results found.    MDM   Final diagnoses:  Need for acute hemodialysis  ESRD (end stage renal disease)   Pt reports missing dialysis. Reports generalized weakness and tiredness.  CBC, BMP ordered. Patient oxygen saturation 88% on RA while talking, Chest XR ordered. Pt requesitng Hep B in order to establish dialysis. Discusseded patient history and condition with on-call hemodialysis physician.  Agrees to hemodialysis and then same day discharge.  Meds given in ED:  Medications - No data to display  Discharge Medication List as of 05/09/2013 12:54 AM          Leotis Shames Doretha Imus, PA-C 05/10/13 2118

## 2013-05-08 NOTE — H&P (Signed)
Short History and Physical Form  05/08/2013,9:04 PM  LOS: 0 days   HPI:  Gary Rivera is an 53 y.o. male  With ESRD on HD in [place] on TTHSAT.  Presented to ER because he had not had HD since Sat and was "feeling the fluid accumulate"  ESRD secondary to HTN, was on PD x 1 yr and then switched to HD 4/14 after peritonitis.  He lives in [place] and plans to transfer care here.  He is interested in Home HD.   Dialyzes at [place] on TTHSAT . EDW 291#.  Past Medical History  Diagnosis Date  . Hypertension   . Renal disorder   . CHF (congestive heart failure)   . Peritoneal dialysis catheter in place   . Anemia     patient reporats that last hgb 7.9 a week ago  . Anxiety state, unspecified     Allergies:  Allergies  Allergen Reactions  . Renvela [Sevelamer] Nausea And Vomiting  SH: Lives by self.  Has sister in town. Nonsmoker, nondrinker FH: + ESRD in mother and Aunt  Medications: {medication reviewed/display:[id number] Results for orders placed during the hospital encounter of 05/08/13 (from the past 48 hour(s))  CBC     Status: Abnormal   Collection Time    05/08/13  2:58 PM      Result Value Ref Range   WBC 5.1  4.0 - 10.5 K/uL   RBC 3.33 (*) 4.22 - 5.81 MIL/uL   Hemoglobin 10.2 (*) 13.0 - 17.0 g/dL   HCT 32.0 (*) 39.0 - 52.0 %   MCV 96.1  78.0 - 100.0 fL   MCH 30.6  26.0 - 34.0 pg   MCHC 31.9  30.0 - 36.0 g/dL   RDW 14.8  11.5 - 15.5 %   Platelets 116 (*) 150 - 400 K/uL   Comment: SPECIMEN CHECKED FOR CLOTS     REPEATED TO VERIFY     PLATELET COUNT CONFIRMED BY SMEAR  BASIC METABOLIC PANEL     Status: Abnormal   Collection Time    05/08/13  2:58 PM      Result Value Ref Range   Sodium 147  137 - 147 mEq/L   Potassium 4.1  3.7 - 5.3 mEq/L   Chloride 103  96 - 112 mEq/L   CO2 23  19 - 32 mEq/L   Glucose, Bld 95  70 - 99 mg/dL   BUN 96 (*) 6 - 23 mg/dL   Creatinine, Ser 19.84 (*) 0.50 - 1.35 mg/dL   Calcium 7.4 (*) 8.4 - 10.5 mg/dL   GFR calc non Af Amer 2 (*)  >90 mL/min   GFR calc Af Amer 3 (*) >90 mL/min   Comment: (NOTE)     The eGFR has been calculated using the CKD EPI equation.     This calculation has not been validated in all clinical situations.     eGFR's persistently <90 mL/min signify possible Chronic Kidney     Disease.  HEPATITIS B SURFACE ANTIGEN     Status: None   Collection Time    05/08/13  4:50 PM      Result Value Ref Range   Hepatitis B Surface Ag NEGATIVE  NEGATIVE   Comment: Performed at [facility]    Dg Chest 2 View  05/08/2013   CLINICAL DATA:  Chest heaviness, shortness of breath and productive cough.  EXAM: CHEST - 2 VIEW  COMPARISON:  CT ANGIO CHEST W/CM &/OR WO/CM dated 04/29/2013; DG  CHEST 2 VIEW dated 04/29/2013  FINDINGS: Stable cardiac enlargement and evidence of probable chronic pulmonary venous hypertensive changes. No overt edema or pleural fluid is seen. No focal airspace consolidation or pneumothorax. There is stable chronic elevation of the right hemidiaphragm and tortuosity of the thoracic aorta. The bony thorax is unremarkable.  IMPRESSION: Stable cardiomegaly and pulmonary venous hypertensive changes.   Electronically Signed   By: Aletta Edouard M.D.   On: 05/08/2013 18:14   ROS: No change in vision No CP Mild SOB no abd PAIN No edema No new arthritic CO No new neurologic CO Physical Exam: Blood pressure 115/56, pulse 64, temperature 98.4 F (36.9 C), temperature source Oral, resp. rate 14, SpO2 89.00%. General:Awake and alert, in NAD Heart:RRR wo MRG Lungs: few faint basilar crackles Abdomen" + BS NTND no HSM Extremities:No edema. L forearm AVF + bruit Neuro: CNI M&SI No asterixis  Assessment/Plan: 1 Volume overload, plan HD tonite 2 ESRD: FU HD center in AM  3. HTN 4 Sec HPTH 5. Anemia  Lannie Heaps T 05/08/2013, 9:04 PM

## 2013-05-08 NOTE — ED Notes (Signed)
Pt states was supposed to have dialysis yesterday in [place], but unable to due to inclement weather. States company may be going out of business. Denies pain, just feels "fluid overload".

## 2013-05-08 NOTE — ED Notes (Signed)
Meal tray ordered 

## 2013-05-08 NOTE — Procedures (Signed)
Pt seen on HD.  Ap 180 Vp 170.  BFR 400.  SBP 138.  Will try for 6.5L if BP will tolerate.  He knows he will be Dc'd after HD.

## 2013-05-08 NOTE — Discharge Summary (Signed)
  Pt admitted for missed HD and mild volume overload.  He underwent HD with around 6L of fluid removal and was DC afterward.  He will follow up with his home HD unit in [place].

## 2013-05-08 NOTE — ED Notes (Signed)
PA at bedside.

## 2013-05-09 NOTE — Progress Notes (Signed)
Pt in for hemodialysis Tx. Missed Tx due to lack of transportation to his center. Ran 3hrs of a 3hr Tx. Signed off early due to cramping in his hands. Dr. Briant Cedar aware

## 2013-05-12 NOTE — ED Provider Notes (Signed)
Medical screening examination/treatment/procedure(s) were performed by non-physician practitioner and as supervising physician I was immediately available for consultation/collaboration.   Petar Mucci David Kenyan Karnes III, MD 05/12/13 1556 

## 2013-05-13 ENCOUNTER — Encounter (INDEPENDENT_AMBULATORY_CARE_PROVIDER_SITE_OTHER): Payer: Medicare Other | Admitting: General Surgery

## 2013-07-05 ENCOUNTER — Encounter (INDEPENDENT_AMBULATORY_CARE_PROVIDER_SITE_OTHER): Payer: Medicare Other | Admitting: General Surgery

## 2013-07-10 ENCOUNTER — Encounter (HOSPITAL_COMMUNITY): Payer: Self-pay | Admitting: Emergency Medicine

## 2013-07-10 ENCOUNTER — Inpatient Hospital Stay (HOSPITAL_COMMUNITY)
Admission: EM | Admit: 2013-07-10 | Discharge: 2013-07-16 | DRG: 308 | Disposition: A | Discharge status: Home Health | Payer: Medicare Other | Attending: Internal Medicine | Admitting: Internal Medicine

## 2013-07-10 ENCOUNTER — Emergency Department (HOSPITAL_COMMUNITY): Payer: Medicare Other

## 2013-07-10 DIAGNOSIS — N2581 Secondary hyperparathyroidism of renal origin: Secondary | ICD-10-CM | POA: Diagnosis present

## 2013-07-10 DIAGNOSIS — Z992 Dependence on renal dialysis: Secondary | ICD-10-CM

## 2013-07-10 DIAGNOSIS — R6521 Severe sepsis with septic shock: Secondary | ICD-10-CM

## 2013-07-10 DIAGNOSIS — I4430 Unspecified atrioventricular block: Secondary | ICD-10-CM | POA: Diagnosis present

## 2013-07-10 DIAGNOSIS — I340 Nonrheumatic mitral (valve) insufficiency: Secondary | ICD-10-CM

## 2013-07-10 DIAGNOSIS — I120 Hypertensive chronic kidney disease with stage 5 chronic kidney disease or end stage renal disease: Secondary | ICD-10-CM | POA: Diagnosis present

## 2013-07-10 DIAGNOSIS — G253 Myoclonus: Secondary | ICD-10-CM

## 2013-07-10 DIAGNOSIS — R059 Cough, unspecified: Secondary | ICD-10-CM

## 2013-07-10 DIAGNOSIS — R112 Nausea with vomiting, unspecified: Secondary | ICD-10-CM

## 2013-07-10 DIAGNOSIS — A419 Sepsis, unspecified organism: Secondary | ICD-10-CM

## 2013-07-10 DIAGNOSIS — R410 Disorientation, unspecified: Secondary | ICD-10-CM

## 2013-07-10 DIAGNOSIS — I2789 Other specified pulmonary heart diseases: Secondary | ICD-10-CM | POA: Diagnosis present

## 2013-07-10 DIAGNOSIS — J9602 Acute respiratory failure with hypercapnia: Secondary | ICD-10-CM

## 2013-07-10 DIAGNOSIS — I517 Cardiomegaly: Secondary | ICD-10-CM

## 2013-07-10 DIAGNOSIS — I4891 Unspecified atrial fibrillation: Secondary | ICD-10-CM | POA: Diagnosis present

## 2013-07-10 DIAGNOSIS — N186 End stage renal disease: Secondary | ICD-10-CM

## 2013-07-10 DIAGNOSIS — I4892 Unspecified atrial flutter: Principal | ICD-10-CM

## 2013-07-10 DIAGNOSIS — Z6838 Body mass index (BMI) 38.0-38.9, adult: Secondary | ICD-10-CM

## 2013-07-10 DIAGNOSIS — D631 Anemia in chronic kidney disease: Secondary | ICD-10-CM | POA: Diagnosis present

## 2013-07-10 DIAGNOSIS — R748 Abnormal levels of other serum enzymes: Secondary | ICD-10-CM

## 2013-07-10 DIAGNOSIS — M949 Disorder of cartilage, unspecified: Secondary | ICD-10-CM

## 2013-07-10 DIAGNOSIS — I959 Hypotension, unspecified: Secondary | ICD-10-CM

## 2013-07-10 DIAGNOSIS — J383 Other diseases of vocal cords: Secondary | ICD-10-CM

## 2013-07-10 DIAGNOSIS — R05 Cough: Secondary | ICD-10-CM

## 2013-07-10 DIAGNOSIS — I953 Hypotension of hemodialysis: Secondary | ICD-10-CM

## 2013-07-10 DIAGNOSIS — D649 Anemia, unspecified: Secondary | ICD-10-CM

## 2013-07-10 DIAGNOSIS — I10 Essential (primary) hypertension: Secondary | ICD-10-CM

## 2013-07-10 DIAGNOSIS — K659 Peritonitis, unspecified: Secondary | ICD-10-CM

## 2013-07-10 DIAGNOSIS — J811 Chronic pulmonary edema: Secondary | ICD-10-CM

## 2013-07-10 DIAGNOSIS — Z7982 Long term (current) use of aspirin: Secondary | ICD-10-CM

## 2013-07-10 DIAGNOSIS — D696 Thrombocytopenia, unspecified: Secondary | ICD-10-CM

## 2013-07-10 DIAGNOSIS — R799 Abnormal finding of blood chemistry, unspecified: Secondary | ICD-10-CM

## 2013-07-10 DIAGNOSIS — J9601 Acute respiratory failure with hypoxia: Secondary | ICD-10-CM

## 2013-07-10 DIAGNOSIS — I5022 Chronic systolic (congestive) heart failure: Secondary | ICD-10-CM

## 2013-07-10 DIAGNOSIS — G4733 Obstructive sleep apnea (adult) (pediatric): Secondary | ICD-10-CM | POA: Diagnosis present

## 2013-07-10 DIAGNOSIS — I509 Heart failure, unspecified: Secondary | ICD-10-CM | POA: Diagnosis present

## 2013-07-10 DIAGNOSIS — I059 Rheumatic mitral valve disease, unspecified: Secondary | ICD-10-CM | POA: Diagnosis present

## 2013-07-10 DIAGNOSIS — I079 Rheumatic tricuspid valve disease, unspecified: Secondary | ICD-10-CM | POA: Diagnosis present

## 2013-07-10 DIAGNOSIS — I428 Other cardiomyopathies: Secondary | ICD-10-CM

## 2013-07-10 DIAGNOSIS — J45909 Unspecified asthma, uncomplicated: Secondary | ICD-10-CM | POA: Diagnosis present

## 2013-07-10 DIAGNOSIS — Z79899 Other long term (current) drug therapy: Secondary | ICD-10-CM

## 2013-07-10 DIAGNOSIS — R7989 Other specified abnormal findings of blood chemistry: Secondary | ICD-10-CM

## 2013-07-10 DIAGNOSIS — F411 Generalized anxiety disorder: Secondary | ICD-10-CM | POA: Diagnosis present

## 2013-07-10 DIAGNOSIS — R778 Other specified abnormalities of plasma proteins: Secondary | ICD-10-CM

## 2013-07-10 DIAGNOSIS — E669 Obesity, unspecified: Secondary | ICD-10-CM

## 2013-07-10 DIAGNOSIS — N039 Chronic nephritic syndrome with unspecified morphologic changes: Secondary | ICD-10-CM

## 2013-07-10 DIAGNOSIS — M899 Disorder of bone, unspecified: Secondary | ICD-10-CM | POA: Diagnosis present

## 2013-07-10 DIAGNOSIS — R0689 Other abnormalities of breathing: Secondary | ICD-10-CM

## 2013-07-10 DIAGNOSIS — T8571XA Infection and inflammatory reaction due to peritoneal dialysis catheter, initial encounter: Secondary | ICD-10-CM

## 2013-07-10 DIAGNOSIS — J019 Acute sinusitis, unspecified: Secondary | ICD-10-CM | POA: Diagnosis not present

## 2013-07-10 DIAGNOSIS — Z8249 Family history of ischemic heart disease and other diseases of the circulatory system: Secondary | ICD-10-CM

## 2013-07-10 DIAGNOSIS — E662 Morbid (severe) obesity with alveolar hypoventilation: Secondary | ICD-10-CM

## 2013-07-10 DIAGNOSIS — I272 Pulmonary hypertension, unspecified: Secondary | ICD-10-CM

## 2013-07-10 HISTORY — DX: End stage renal disease: N18.6

## 2013-07-10 HISTORY — DX: Dependence on renal dialysis: Z99.2

## 2013-07-10 LAB — PHOSPHORUS: PHOSPHORUS: 7.2 mg/dL — AB (ref 2.3–4.6)

## 2013-07-10 LAB — I-STAT TROPONIN, ED: TROPONIN I, POC: 0.06 ng/mL (ref 0.00–0.08)

## 2013-07-10 LAB — BASIC METABOLIC PANEL
BUN: 65 mg/dL — ABNORMAL HIGH (ref 6–23)
CHLORIDE: 101 meq/L (ref 96–112)
CO2: 24 meq/L (ref 19–32)
CREATININE: 14.31 mg/dL — AB (ref 0.50–1.35)
Calcium: 7.7 mg/dL — ABNORMAL LOW (ref 8.4–10.5)
GFR calc Af Amer: 4 mL/min — ABNORMAL LOW (ref >90)
GFR calc non Af Amer: 3 mL/min — ABNORMAL LOW (ref >90)
GLUCOSE: 95 mg/dL (ref 70–99)
Potassium: 4.1 mEq/L (ref 3.7–5.3)
Sodium: 148 mEq/L — ABNORMAL HIGH (ref 137–147)

## 2013-07-10 LAB — TROPONIN I
Troponin I: 0.32 ng/mL (ref <0.30)
Troponin I: 0.31 ng/mL (ref <0.30)

## 2013-07-10 LAB — MAGNESIUM: Magnesium: 1.8 mg/dL (ref 1.5–2.5)

## 2013-07-10 LAB — CBC
HCT: 35.8 % — ABNORMAL LOW (ref 39.0–52.0)
Hemoglobin: 11.1 g/dL — ABNORMAL LOW (ref 13.0–17.0)
MCH: 30.1 pg (ref 26.0–34.0)
MCHC: 31 g/dL (ref 30.0–36.0)
MCV: 97 fL (ref 78.0–100.0)
PLATELETS: 143 10*3/uL — AB (ref 150–400)
RBC: 3.69 MIL/uL — AB (ref 4.22–5.81)
RDW: 15.6 % — ABNORMAL HIGH (ref 11.5–15.5)
WBC: 7.3 10*3/uL (ref 4.0–10.5)

## 2013-07-10 LAB — TSH: TSH: 1.49 u[IU]/mL (ref 0.350–4.500)

## 2013-07-10 LAB — MRSA PCR SCREENING: MRSA BY PCR: NEGATIVE

## 2013-07-10 MED ORDER — DILTIAZEM HCL 100 MG IV SOLR
5.0000 mg/h | INTRAVENOUS | Status: DC
  Administered 2013-07-10: 5 mg/h via INTRAVENOUS
  Administered 2013-07-11: 7 mg/h via INTRAVENOUS
  Administered 2013-07-11: 5 mg/h via INTRAVENOUS
  Filled 2013-07-10 (×2): qty 100

## 2013-07-10 MED ORDER — ASPIRIN 325 MG PO TABS
325.0000 mg | ORAL_TABLET | Freq: Once | ORAL | Status: AC
  Administered 2013-07-10: 325 mg via ORAL
  Filled 2013-07-10: qty 1

## 2013-07-10 MED ORDER — SODIUM CHLORIDE 0.9 % IJ SOLN
3.0000 mL | Freq: Two times a day (BID) | INTRAMUSCULAR | Status: DC
  Administered 2013-07-12 (×2): 3 mL via INTRAVENOUS

## 2013-07-10 MED ORDER — WARFARIN - PHARMACIST DOSING INPATIENT
Freq: Every day | Status: DC
Start: 2013-07-10 — End: 2013-07-16

## 2013-07-10 MED ORDER — SODIUM CHLORIDE 0.9 % IJ SOLN: 3.0000 mL | INTRAMUSCULAR | Status: DC | PRN

## 2013-07-10 MED ORDER — OXYCODONE-ACETAMINOPHEN 5-325 MG PO TABS: 1.0000 | ORAL_TABLET | ORAL | Status: DC | PRN

## 2013-07-10 MED ORDER — SODIUM CHLORIDE 0.9 % IJ SOLN
3.0000 mL | Freq: Two times a day (BID) | INTRAMUSCULAR | Status: DC
Start: 2013-07-10 — End: 2013-07-13
  Administered 2013-07-12 (×2): 3 mL via INTRAVENOUS

## 2013-07-10 MED ORDER — LANTHANUM CARBONATE 500 MG PO CHEW
1000.0000 mg | CHEWABLE_TABLET | Freq: Three times a day (TID) | ORAL | Status: DC
  Filled 2013-07-10 (×4): qty 2

## 2013-07-10 MED ORDER — DILTIAZEM LOAD VIA INFUSION
15.0000 mg | Freq: Once | INTRAVENOUS | Status: AC
  Administered 2013-07-10: 15 mg via INTRAVENOUS
  Filled 2013-07-10: qty 15

## 2013-07-10 MED ORDER — WARFARIN SODIUM 10 MG PO TABS
10.0000 mg | ORAL_TABLET | Freq: Once | ORAL | Status: AC
  Administered 2013-07-10: 10 mg via ORAL
  Filled 2013-07-10: qty 1

## 2013-07-10 MED ORDER — SODIUM CHLORIDE 0.9 % IV SOLN: 250.0000 mL | INTRAVENOUS | Status: DC | PRN

## 2013-07-10 MED ORDER — HEPARIN (PORCINE) IN NACL 100-0.45 UNIT/ML-% IJ SOLN
2650.0000 [IU]/h | INTRAMUSCULAR | Status: DC
  Administered 2013-07-10 (×2): 1950 [IU]/h via INTRAVENOUS
  Administered 2013-07-11 (×2): 2100 [IU]/h via INTRAVENOUS
  Administered 2013-07-12 (×2): 2300 [IU]/h via INTRAVENOUS
  Administered 2013-07-13 – 2013-07-15 (×5): 2650 [IU]/h via INTRAVENOUS
  Filled 2013-07-10 (×14): qty 250

## 2013-07-10 MED ORDER — HEPARIN BOLUS VIA INFUSION
4000.0000 [IU] | Freq: Once | INTRAVENOUS | Status: AC
  Administered 2013-07-10: 4000 [IU] via INTRAVENOUS
  Filled 2013-07-10: qty 4000

## 2013-07-10 MED ORDER — CALCITRIOL 0.25 MCG PO CAPS
0.2500 ug | ORAL_CAPSULE | Freq: Every day | ORAL | Status: DC
  Administered 2013-07-11: 0.25 ug via ORAL
  Filled 2013-07-10: qty 1

## 2013-07-10 MED ORDER — SODIUM CHLORIDE 0.9 % IV BOLUS (SEPSIS)
500.0000 mL | Freq: Once | INTRAVENOUS | Status: AC
  Administered 2013-07-10: 500 mL via INTRAVENOUS

## 2013-07-10 NOTE — ED Notes (Signed)
Report called to [place], RN 2C

## 2013-07-10 NOTE — ED Notes (Signed)
Attempted report x 3.  

## 2013-07-10 NOTE — H&P (Signed)
[facility] History and Physical  Gary Dikeerrance O Hiltner UJW:[id number] DOB: 1960/08/04 DOA: 07/10/2013  Referring physician: Dr. Patria Maneampos PCP: Quitman LivingsHASSAN,SAMI, MD   Chief Complaint: "Fast heart rate"  HPI: Gary Rivera is a 53 y.o. male. He has established end stage renal disease; he is to be on peritoneal dialysis but then transitioned to hemodialysis approximately one year ago. His nephrologist is Dr. Felipa Furnacehristopher Marshall in the [facility]. The patient has been on home hemodialysis treatments via left AV fistula which she takes 5 days per week. He denies any recent problems with his dialysis treatments. Also has a history of hypertension and congestive heart failure with reportedly EF as low as 22% but subsequent recovery, per his report.  The patient's current symptoms have been lasting approximately 2-3 days. He feels as if his pulse rate is high, which he notices as he takes his vital signs during home hemodialysis. He actually has minimal palpitations or lightheadedness. He occasionally has feelings of lightheadedness upon standing but this is reportedly a chronic issue and unrelated to his current presentation. The patient denies chest pain, orthopnea, lower extremity edema. He makes little urine. His bowel function is normal. He denies any signs of infection except for modest temperature elevation of 99.1 which she reports is higher than his norm.   Patient was evaluated as nephrology office and upon finding that he was tachycardic to the 130 to 140 range refer him to the emergency department for further evaluation and treatment. He sounded in atrial flutter with rapid ventricular rate; he was begun on a diltiazem infusion and required admission for further management.  Review of Systems:  Review of Systems - General ROS: negative Respiratory ROS: no cough, shortness of breath, or wheezing Cardiovascular ROS: positive for - palpitations negative for - chest pain, dyspnea on  exertion, edema, loss of consciousness, paroxysmal nocturnal dyspnea or shortness of breath Gastrointestinal ROS: no abdominal pain, change in bowel habits, or black or bloody stools Genito-Urinary ROS: no dysuria, trouble voiding, or hematuria Musculoskeletal ROS: negative The remainder of a complete review of systems was reviewed and was negative.  Past Medical History  Diagnosis Date  . Hypertension   . Renal disorder   . CHF (congestive heart failure)   . Peritoneal dialysis catheter in place   . Anemia     patient reporats that last hgb 7.9 a week ago  . Anxiety state, unspecified    Past Surgical History  Procedure Laterality Date  . Appendectomy    . Peritoneal catheter insertion     Social History:  reports that he has never smoked. He has never used smokeless tobacco. He reports that he does not drink alcohol or use illicit drugs.  Allergies  Allergen Reactions  . Renvela [Sevelamer] Nausea And Vomiting    Family History  Problem Relation Age of Onset  . Diabetes Mother   . Hypertension Mother   . Hypertension Sister   . Asthma Sister      Prior to Admission medications   Medication Sig Start Date End Date Taking? Authorizing Provider  albuterol (PROVENTIL HFA;VENTOLIN HFA) 108 (90 BASE) MCG/ACT inhaler Inhale 1 puff into the lungs every 6 (six) hours as needed for wheezing or shortness of breath.   Yes Historical Provider, MD  aspirin EC 81 MG tablet Take 81 mg by mouth daily.   Yes Historical Provider, MD  calcitRIOL (ROCALTROL) 0.25 MCG capsule Take 0.25 mcg by mouth daily.   Yes Historical Provider, MD  clonazePAM Scarlette Calico(KLONOPIN)  0.5 MG tablet Take 0.5 mg by mouth 2 (two) times daily as needed. anxiety   Yes Historical Provider, MD  lanthanum (FOSRENOL) 1000 MG chewable tablet Chew 1,000 mg by mouth 3 (three) times daily with meals. Twice daily with meals and 1 with snacks   Yes Historical Provider, MD  oxyCODONE-acetaminophen (PERCOCET/ROXICET) 5-325 MG per tablet  Take 1-2 tablets by mouth every 4 (four) hours as needed (severe pain). 07/16/12  Yes Zannie Cove, MD    Physical Exam: Filed Vitals:   07/10/13 1815  BP: 90/61  Pulse: 129  Resp:    BP 90/61  Pulse 129  Resp 23  SpO2 94%  General: Alert, oriented to self, place, and time. HEENT: Gaze is conjugate.  No nasal deformities.  No nasal discharge noted.  Mucus membranes are well hydrated.  Tongue protrudes midline. Cardiovascular: Rapid (130s-140s) rate with irregular rhythm.  Normal S1 and S2.  No rubs or gallop sounds.  No murmur.  No jugular venous distention noted.  Pulmonary: Good aeration to the bilateral bases.  No adventitious sounds auscultated. Abdominal: Soft, without distention.  Non-tender throughout.  Bowel sounds are normoactive.  Prior scar from PD cathter. GU: Rectal exam deferred. MSK: Extremities without cyanosis and with good capillary refill.  No edema bilaterally.  LAVF fistula with thrill. Skin: Without icterus, rash, or other lesions.  No significant wounds. Neuropsychiatric: Good attention.  No obvious motor deficits.  Good insight into situation and disease process.  Capable of making medical decisions.    Labs on Admission:  Basic Metabolic Panel:  Recent Labs Lab 07/10/13 1631  NA 148*  K 4.1  CL 101  CO2 24  GLUCOSE 95  BUN 65*  CREATININE 14.31*  CALCIUM 7.7*   Liver Function Tests: No results found for this basename: AST, ALT, ALKPHOS, BILITOT, PROT, ALBUMIN,  in the last 168 hours No results found for this basename: LIPASE, AMYLASE,  in the last 168 hours No results found for this basename: AMMONIA,  in the last 168 hours CBC:  Recent Labs Lab 07/10/13 1631  WBC 7.3  HGB 11.1*  HCT 35.8*  MCV 97.0  PLT 143*   Cardiac Enzymes:  Recent Labs Lab 07/10/13 1637  TROPONINI 0.32*    BNP (last 3 results)  Recent Labs  04/29/13 0437  PROBNP 3309.0*   CBG: No results found for this basename: GLUCAP,  in the last 168  hours  Radiological Exams on Admission: Dg Chest Portable 1 View  07/10/2013   CLINICAL DATA:  Bronchitis, hypertension, tachycardia  EXAM: PORTABLE CHEST - 1 VIEW  COMPARISON:  05/08/2013  FINDINGS: Stable cardiomegaly with vascular congestion. No focal pneumonia, collapse or consolidation. No edema, effusion or pneumothorax. Trachea midline.  IMPRESSION: Stable cardiomegaly with vascular congestion. No superimposed acute process.   Electronically Signed   By: Ruel Favors M.D.   On: 07/10/2013 17:03    EKG: Independently reviewed. Atrial flutter with V rate 111.  Variable block, 2:1 to 3:1.  Nonspec T wave abn.  TTE at [facility] 03/11/2011 LVEF 35-40%, mod conc LVH RA dilatation, mod PHTN (PASP est 55) Mod MR  Assessment/Plan Active Problems:   Atrial flutter   Atrial flutter with rapid ventricular response  (hospital diagnoses bolded)  1. A Flutter with RVR.  No clear precipitant. Patient has been adherent to hemodialysis at home. No clinical signs of infection or other provoking physiologic issue except perhaps some mildly increased caffeine consumption. No history of thyroid disorders.  IV diltiazem  infusion with titratable dosing; monitoring on telemetry; goal heart rate 110 although patient's blood pressure may not tolerate high doses of diltiazem  Patient elevated risk of stroke: Initiate systemic heparin as well as initiation of Coumadin with aided dosing per pharmacy consultation  No evidence of acute MI but we'll cycle cardiac enzymes  Will need transthoracic echocardiogram when rate is better controlled  N.p.o. at midnight for possible TEE or cardioversion  Patient will need ongoing consultation with general. and or electrophysiologic cardiology consultants.  If pt has some vol overload/atrial dilatation from his CMP, his arrhythmia may improve with more aggressive HD but this should be a shared decision between medicine, cardiology, nephrology 2. Presumed  nonischemic cardiomyopathy and heart failure, chronic.  Patient reportedly has an echo as low as 22% but then had an EF which was reported to recover. As above, patient will need repeat echocardiography. 3. End-stage renal disease (thought secondary to hypertensive dz) on home hemodialysis. Left AV fistula received 5 treatments per week. Has been adherent per his report. Patient will need consultation by nephrology non-emergently.  Pt has clinic notes (Dr. Toney Sang) available for review in CareEverywhere. 4. Asthma; controlled; monitoring. 5. Obesity tx deferred to outpt setting. 6. HTN is not active as the patient is hypotensive due to Aflut-RVR.  Cardiology formally consulted by ED providers; discussed case with Dr. Shirlee Latch at the bedside.  Code Status: Full  Family Communication: Not needed; pt with good capacity and insight.  Disposition Plan: Inpt Stepdown, tele   Time spent: 72 minutes  Jacqulyn Ducking [facility] Pager [phone]

## 2013-07-10 NOTE — ED Notes (Signed)
Pt was sent from renal doctor because his pulse rate was high during a check up today. The pt denies any complaints and states he "feels fine."

## 2013-07-10 NOTE — Progress Notes (Addendum)
ANTICOAGULATION CONSULT NOTE - Initial Consult  Pharmacy Consult for Heparin  Indication: atrial fibrillation  Allergies  Allergen Reactions  . Renvela [Sevelamer] Nausea And Vomiting    Patient Measurements:   Heparin Dosing Weight: 114 kg   Vital Signs: BP: 90/61 mmHg (04/22 1815) Pulse Rate: 129 (04/22 1815)  Labs:  Recent Labs  07/10/13 1631 07/10/13 1637  HGB 11.1*  --   HCT 35.8*  --   PLT 143*  --   CREATININE 14.31*  --   TROPONINI  --  0.32*    The CrCl is unknown because both a height and weight (above a minimum accepted value) are required for this calculation.   Medical History: Past Medical History  Diagnosis Date  . Hypertension   . Renal disorder   . CHF (congestive heart failure)   . Peritoneal dialysis catheter in place   . Anemia     patient reporats that last hgb 7.9 a week ago  . Anxiety state, unspecified     Medications:   (Not in a hospital admission)  Assessment: 89 YOM with h/o of Afib and CKD on HD presented to the ED with increasing heart rate over past several days. Pharmacy to start heparin drip for atrial flutter with RVR with plan for cardioversion if he remains in Atrial flutter tomorrow. Hgb 11.1, Plt 143. Not on anticoagulation prior to admission  Goal of Therapy:  Heparin level 0.3-0.7 units/ml Monitor platelets by anticoagulation protocol: Yes   Plan:  Give 4000 units bolus x 1 Start heparin infusion at 1600 units/hr Check anti-Xa level in 8 hours and daily while on heparin Continue to monitor H&H and platelets  Vinnie Level, PharmD.  Clinical Pharmacist Pager [phone]   Addendum: Now adding coumadin to heparin infusion. Coumadin points > 8. Will give coumadin dose of 10 mg today. Monitor daily INR.   Vinnie Level, PharmD.  Clinical Pharmacist Pager [phone]

## 2013-07-10 NOTE — Consult Note (Signed)
Reason for Consult: Atrial Flutter w/ RVR Referring Physician: [facility]   HPI: The patient is a 53 y/o male with CKD, on home HD 5 days a week, followed by Dr. Joelyn Oms. He states that he has been followed in the past by a cardiologist at [facility] in [place], but cannot recall her name. He was last seen by her over a year ago. He states he was being followed for CHF, which was first diagnosed when is CKD was discovered 5 years ago. He states that he was told that his "pump function recovered" and he only reports as needed. He denies any other cardiac issues.   He reported to Dr. Adin Hector office today for routine check-up. An EKG was obtained as part of the office visit and he was noted to be in atrial flutter with RVR. He denies any symptoms. He has been completely asymptomatic. He denies any prior h/o of afib/ flutter. He denies any known thyroid disorders and no h/o OSA. No recent alcohol intake, but admits to increased caffeine consumption. No fever, chills, n/v. No recent infections.    Past Medical History  Diagnosis Date  . Hypertension   . Renal disorder   . CHF (congestive heart failure)   . Peritoneal dialysis catheter in place   . Anemia     patient reporats that last hgb 7.9 a week ago  . Anxiety state, unspecified     Past Surgical History  Procedure Laterality Date  . Appendectomy    . Peritoneal catheter insertion      Family History  Problem Relation Age of Onset  . Diabetes Mother   . Hypertension Mother   . Hypertension Sister   . Asthma Sister     Social History:  reports that he has never smoked. He has never used smokeless tobacco. He reports that he does not drink alcohol or use illicit drugs.  Allergies:  Allergies  Allergen Reactions  . Renvela [Sevelamer] Nausea And Vomiting    Medications:  Prior to Admission medications   Medication Sig Start Date End Date Taking? Authorizing Provider  albuterol (PROVENTIL HFA;VENTOLIN HFA) 108 (90 BASE) MCG/ACT inhaler  Inhale 1 puff into the lungs every 6 (six) hours as needed for wheezing or shortness of breath.   Yes Historical Provider, MD  aspirin EC 81 MG tablet Take 81 mg by mouth daily.   Yes Historical Provider, MD  calcitRIOL (ROCALTROL) 0.25 MCG capsule Take 0.25 mcg by mouth daily.   Yes Historical Provider, MD  clonazePAM (KLONOPIN) 0.5 MG tablet Take 0.5 mg by mouth 2 (two) times daily as needed. anxiety   Yes Historical Provider, MD  lanthanum (FOSRENOL) 1000 MG chewable tablet Chew 1,000 mg by mouth 3 (three) times daily with meals. Twice daily with meals and 1 with snacks   Yes Historical Provider, MD  oxyCODONE-acetaminophen (PERCOCET/ROXICET) 5-325 MG per tablet Take 1-2 tablets by mouth every 4 (four) hours as needed (severe pain). 07/16/12  Yes Domenic Polite, MD     Results for orders placed during the hospital encounter of 07/10/13 (from the past 48 hour(s))  CBC     Status: Abnormal   Collection Time    07/10/13  4:31 PM      Result Value Ref Range   WBC 7.3  4.0 - 10.5 K/uL   RBC 3.69 (*) 4.22 - 5.81 MIL/uL   Hemoglobin 11.1 (*) 13.0 - 17.0 g/dL   HCT 35.8 (*) 39.0 - 52.0 %   MCV 97.0  78.0 -  100.0 fL   MCH 30.1  26.0 - 34.0 pg   MCHC 31.0  30.0 - 36.0 g/dL   RDW 15.6 (*) 11.5 - 15.5 %   Platelets 143 (*) 150 - 400 K/uL  BASIC METABOLIC PANEL     Status: Abnormal   Collection Time    07/10/13  4:31 PM      Result Value Ref Range   Sodium 148 (*) 137 - 147 mEq/L   Potassium 4.1  3.7 - 5.3 mEq/L   Chloride 101  96 - 112 mEq/L   CO2 24  19 - 32 mEq/L   Glucose, Bld 95  70 - 99 mg/dL   BUN 65 (*) 6 - 23 mg/dL   Creatinine, Ser 14.31 (*) 0.50 - 1.35 mg/dL   Calcium 7.7 (*) 8.4 - 10.5 mg/dL   GFR calc non Af Amer 3 (*) >90 mL/min   GFR calc Af Amer 4 (*) >90 mL/min   Comment: (NOTE)     The eGFR has been calculated using the CKD EPI equation.     This calculation has not been validated in all clinical situations.     eGFR's persistently <90 mL/min signify possible Chronic  Kidney     Disease.  TROPONIN I     Status: Abnormal   Collection Time    07/10/13  4:37 PM      Result Value Ref Range   Troponin I 0.32 (*) <0.30 ng/mL   Comment:            Due to the release kinetics of cTnI,     a negative result within the first hours     of the onset of symptoms does not rule out     myocardial infarction with certainty.     If myocardial infarction is still suspected,     repeat the test at appropriate intervals.     CRITICAL RESULT CALLED TO, READ BACK BY AND VERIFIED WITH:     C GROSE,RN 07/10/13 [place], ED     Status: None   Collection Time    07/10/13  4:43 PM      Result Value Ref Range   Troponin i, poc 0.06  0.00 - 0.08 ng/mL   Comment 3            Comment: Due to the release kinetics of cTnI,     a negative result within the first hours     of the onset of symptoms does not rule out     myocardial infarction with certainty.     If myocardial infarction is still suspected,     repeat the test at appropriate intervals.    Dg Chest Portable 1 View  07/10/2013   CLINICAL DATA:  Bronchitis, hypertension, tachycardia  EXAM: PORTABLE CHEST - 1 VIEW  COMPARISON:  05/08/2013  FINDINGS: Stable cardiomegaly with vascular congestion. No focal pneumonia, collapse or consolidation. No edema, effusion or pneumothorax. Trachea midline.  IMPRESSION: Stable cardiomegaly with vascular congestion. No superimposed acute process.   Electronically Signed   By: Daryll Brod M.D.   On: 07/10/2013 17:03    Review of Systems  Respiratory: Negative for shortness of breath.   Cardiovascular: Negative for chest pain, palpitations and leg swelling.  Neurological: Negative for dizziness and loss of consciousness.  All other systems reviewed and are negative.  Blood pressure 101/44, pulse 69, resp. rate 23, SpO2 96.00%. Physical Exam  Constitutional: He is oriented to person,  place, and time. He appears well-developed and well-nourished. No  distress.  Cardiovascular: Regular rhythm, normal heart sounds and intact distal pulses.  Tachycardia present.  Exam reveals no gallop and no friction rub.   No murmur heard. Pulses:      Radial pulses are 2+ on the right side, and 2+ on the left side.       Dorsalis pedis pulses are 2+ on the right side, and 2+ on the left side.  Respiratory: Effort normal and breath sounds normal. No respiratory distress. He has no wheezes. He has no rales.  GI: Soft. Bowel sounds are normal.  Musculoskeletal: He exhibits edema.  Neurological: He is alert and oriented to person, place, and time.  Skin: Skin is warm and dry. He is not diaphoretic.  Psychiatric: He has a normal mood and affect. His behavior is normal.    Assessment/Plan: Active Problems:   Atrial flutter  Plan: 1. Atrial Flutter w/ RVR:  - TRH to admit. Monitor on Telemetry.   - continue IV cardizem for rate control (monitor BP closely)  - Will initiate IV heparin for stroke prophylaxis  - will start Coumadin (goal INR 2-3)  - cycle cardiac enzymes  -check TSH  - He will need a 2D echo, but will need to wait until ventricular rate slows so that study is not limited  - If he remains in atrial flutter, he will need a TEE/DCCV prior to discharge  - will make NPO at midnight  We will continue to follow along.   Brittainy Simmons 07/10/2013, 6:25 PM   Patient seen with PA, agree with the above note.    Patient is in atrial flutter with RVR, HR 140s.  He has noted that his HR has been up for about 2 days.  He has not felt palpitations, chest pain, or had any dyspnea.  His troponin is mildly elevated. 1. Atrial flutter: With RVR.  Has been present at least 2 days.  He is minimally symptomatic.  He has a history of CHF with LV dysfunction, per his report he was told by his cardiologist at [facility] at last visit that LV function had recovered to normal.   - Diltiazem gtt  - Will start heparin gtt - If he remains in atrial flutter  with RVR tomorrow, he should be cardioverted given risk of development of tachy-mediated cardiomyopathy (already has prior history of cardiomyopathy).   - If he is cardioverted he will need at least 1 month of coumadin (would avoid NOACs with ESRD).  Will go ahead and start coumadin.  Beyond that time, he is likely higher risk for CVA given ESRD and h/o cardiomyopathy though by traditional CHADSVASC may not be a definite anticoagulation patient.  Atrial flutter ablation would also be an option to be explored as it would provide more definite treatment, would discuss with EP in the am.   2. Elevated troponin: Totally asymptomatic.  He does have an anterolateral strain pattern on ECG in atrial flutter with RVR.  Will follow troponin to peak.  Suspect demand ischemia in the setting of atrial flutter with RVR in setting of ESRD.  As long as troponin remains at a low level, would plan outpatient Cardiolite evaluation. He will be on heparin gtt . 3. Cardiomyopathy: History of presumed nonischemic cardiomyopathy.  There was an echo in our system with EF 40-45% in 2013.  More recently, he says that his cardiologist at [facility] told him that his EF had "recovered."  Will  get echo after HR is more controlled (either rate control or cardioversion).    Larey Dresser 07/10/2013 6:53 PM

## 2013-07-10 NOTE — ED Provider Notes (Addendum)
CSN: [id number]     Arrival date & time 07/10/13  1618 History   First MD Initiated Contact with Patient 07/10/13 1635     Chief Complaint  Patient presents with  . Tachycardia      HPI Patient presents emergency department because of increasing heart rate of about several days.  He denies a sensation of palpitations but hasn't checked his pulse is elevated.  He denies chest pain or shortness breath.  He does report increased caffeine intake recently.  He has a home hemodialysis patient.  He dialyzes 5 days a week.  His last dialysis treatment was yesterday.  He he denies fevers or chills.  No abdominal pain.  No diarrhea.  No other complaints.  Compliant with his other medications.  No history of atrial fibrillation or atrial flutter.  Presents to the emergency department with a heart rate of 150.  Sent from the nephrology office   Past Medical History  Diagnosis Date  . Hypertension   . Renal disorder   . CHF (congestive heart failure)   . Peritoneal dialysis catheter in place   . Anemia     patient reporats that last hgb 7.9 a week ago  . Anxiety state, unspecified    Past Surgical History  Procedure Laterality Date  . Appendectomy    . Peritoneal catheter insertion     Family History  Problem Relation Age of Onset  . Diabetes Mother   . Hypertension Mother   . Hypertension Sister   . Asthma Sister    History  Substance Use Topics  . Smoking status: Never Smoker   . Smokeless tobacco: Never Used  . Alcohol Use: No    Review of Systems  All other systems reviewed and are negative.     Allergies  Renvela  Home Medications   Prior to Admission medications   Medication Sig Start Date End Date Taking? Authorizing Provider  albuterol (PROVENTIL HFA;VENTOLIN HFA) 108 (90 BASE) MCG/ACT inhaler Inhale 1 puff into the lungs every 6 (six) hours as needed for wheezing or shortness of breath.   Yes Historical Provider, MD  aspirin EC 81 MG tablet Take 81 mg by mouth  daily.   Yes Historical Provider, MD  calcitRIOL (ROCALTROL) 0.25 MCG capsule Take 0.25 mcg by mouth daily.   Yes Historical Provider, MD  clonazePAM (KLONOPIN) 0.5 MG tablet Take 0.5 mg by mouth 2 (two) times daily as needed. anxiety   Yes Historical Provider, MD  lanthanum (FOSRENOL) 1000 MG chewable tablet Chew 1,000 mg by mouth 3 (three) times daily with meals. Twice daily with meals and 1 with snacks   Yes Historical Provider, MD  oxyCODONE-acetaminophen (PERCOCET/ROXICET) 5-325 MG per tablet Take 1-2 tablets by mouth every 4 (four) hours as needed (severe pain). 07/16/12  Yes Zannie Cove, MD   BP 91/56  Pulse 150  Resp 20  SpO2 99% Physical Exam  Nursing note and vitals reviewed. Constitutional: He is oriented to person, place, and time. He appears well-developed and well-nourished.  HENT:  Head: Normocephalic and atraumatic.  Eyes: EOM are normal.  Neck: Normal range of motion.  Cardiovascular: Regular rhythm, normal heart sounds and intact distal pulses.   Tachycardia  Pulmonary/Chest: Effort normal and breath sounds normal. No respiratory distress.  Abdominal: Soft. He exhibits no distension. There is no tenderness.  Musculoskeletal: Normal range of motion.  Neurological: He is alert and oriented to person, place, and time.  Skin: Skin is warm and dry.  Psychiatric: He has  a normal mood and affect. Judgment normal.    ED Course  Procedures (including critical care time) CRITICAL CARE Performed by: Lyanne CoKevin M Klyn Kroening Total critical care time: 30 Critical care time was exclusive of separately billable procedures and treating other patients. Critical care was necessary to treat or prevent imminent or life-threatening deterioration. Critical care was time spent personally by me on the following activities: development of treatment plan with patient and/or surrogate as well as nursing, discussions with consultants, evaluation of patient's response to treatment, examination of  patient, obtaining history from patient or surrogate, ordering and performing treatments and interventions, ordering and review of laboratory studies, ordering and review of radiographic studies, pulse oximetry and re-evaluation of patient's condition.   Labs Review Labs Reviewed  CBC - Abnormal; Notable for the following:    RBC 3.69 (*)    Hemoglobin 11.1 (*)    HCT 35.8 (*)    RDW 15.6 (*)    Platelets 143 (*)    All other components within normal limits  BASIC METABOLIC PANEL  TROPONIN I  I-STAT TROPOININ, ED    Imaging Review Dg Chest Portable 1 View  07/10/2013   CLINICAL DATA:  Bronchitis, hypertension, tachycardia  EXAM: PORTABLE CHEST - 1 VIEW  COMPARISON:  05/08/2013  FINDINGS: Stable cardiomegaly with vascular congestion. No focal pneumonia, collapse or consolidation. No edema, effusion or pneumothorax. Trachea midline.  IMPRESSION: Stable cardiomegaly with vascular congestion. No superimposed acute process.   Electronically Signed   By: Ruel Favorsrevor  Shick M.D.   On: 07/10/2013 17:03  I personally reviewed the imaging tests through PACS system I reviewed available ER/hospitalization records through the EMR    EKG Interpretation   Date/Time:  Wednesday July 10 2013 16:21:14 EDT Ventricular Rate:  150 PR Interval:  130 QRS Duration: 92 QT Interval:  266 QTC Calculation: 420 R Axis:   -11 Text Interpretation:  2:1 atrial flutter ST \\T \ Marked T-wave abnormality,  consider inferolateral ischemia Abnormal ECG changed from prior ecg  Confirmed by Tashaun Obey  MD, Caryn BeeKEVIN ([id number]) on 07/10/2013 4:39:48 PM      ECG interpretation 1311  Date: 07/10/2013  Rate: 111  Rhythm: normal sinus rhythm  QRS Axis: normal  Intervals: normal  ST/T Wave abnormalities: normal  Conduction Disutrbances: none  Narrative Interpretation:   Old EKG Reviewed: rate improved from prior ecg    MDM   Final diagnoses:  None    Patient presents with what appears to be 2:1 atrial flutter.  Facial  be started on Cardizem with a bolus and drip.  Asymptomatic at this time.  6:04 PM Spoke with cardiology, Dr Shirlee LatchMcLean, who requests heparin drip, and hospitalist admission given his comorbidities that include home hemodialysis.  Hospitalist will be consult.  Heparin will be added given his concerning inferolateral T-wave inversions.     Lyanne CoKevin M Jaelyn Cloninger, MD 07/10/13 1808  Lyanne CoKevin M Khanh Cordner, MD 07/10/13 [phone]

## 2013-07-10 NOTE — ED Notes (Signed)
Pt denies any complaints- went to PCP today and they found his heart rate to be rapid, 145bpm at present, pt denies hx of same.  Denies chest pain or shortness of breath.  Alert and oriented X 4 and speaking in full sentences.

## 2013-07-11 ENCOUNTER — Inpatient Hospital Stay (HOSPITAL_COMMUNITY): Payer: Medicare Other | Admitting: Anesthesiology

## 2013-07-11 ENCOUNTER — Encounter (HOSPITAL_COMMUNITY): Payer: Self-pay | Admitting: *Deleted

## 2013-07-11 ENCOUNTER — Encounter (HOSPITAL_COMMUNITY): Payer: Medicare Other | Admitting: Anesthesiology

## 2013-07-11 ENCOUNTER — Encounter (HOSPITAL_COMMUNITY)
Admission: EM | Disposition: A | Discharge status: Home Health | Payer: Medicare Other | Source: Home / Self Care | Attending: Internal Medicine

## 2013-07-11 DIAGNOSIS — I2789 Other specified pulmonary heart diseases: Secondary | ICD-10-CM

## 2013-07-11 DIAGNOSIS — I10 Essential (primary) hypertension: Secondary | ICD-10-CM

## 2013-07-11 DIAGNOSIS — I4891 Unspecified atrial fibrillation: Secondary | ICD-10-CM

## 2013-07-11 DIAGNOSIS — N186 End stage renal disease: Secondary | ICD-10-CM | POA: Diagnosis present

## 2013-07-11 DIAGNOSIS — I428 Other cardiomyopathies: Secondary | ICD-10-CM | POA: Diagnosis present

## 2013-07-11 DIAGNOSIS — I369 Nonrheumatic tricuspid valve disorder, unspecified: Secondary | ICD-10-CM

## 2013-07-11 DIAGNOSIS — I509 Heart failure, unspecified: Secondary | ICD-10-CM

## 2013-07-11 DIAGNOSIS — E662 Morbid (severe) obesity with alveolar hypoventilation: Secondary | ICD-10-CM

## 2013-07-11 DIAGNOSIS — I359 Nonrheumatic aortic valve disorder, unspecified: Secondary | ICD-10-CM

## 2013-07-11 DIAGNOSIS — I517 Cardiomegaly: Secondary | ICD-10-CM

## 2013-07-11 DIAGNOSIS — I5022 Chronic systolic (congestive) heart failure: Secondary | ICD-10-CM

## 2013-07-11 DIAGNOSIS — I959 Hypotension, unspecified: Secondary | ICD-10-CM | POA: Diagnosis present

## 2013-07-11 HISTORY — PX: CARDIOVERSION: SHX1299

## 2013-07-11 HISTORY — PX: TEE WITHOUT CARDIOVERSION: SHX5443

## 2013-07-11 LAB — TROPONIN I
Troponin I: <0.3 ng/mL (ref <0.30)
Troponin I: <0.3 ng/mL (ref <0.30)

## 2013-07-11 LAB — CBC
HCT: 32.8 % — ABNORMAL LOW (ref 39.0–52.0)
HEMOGLOBIN: 10.1 g/dL — AB (ref 13.0–17.0)
MCH: 30 pg (ref 26.0–34.0)
MCHC: 30.8 g/dL (ref 30.0–36.0)
MCV: 97.3 fL (ref 78.0–100.0)
Platelets: 129 10*3/uL — ABNORMAL LOW (ref 150–400)
RBC: 3.37 MIL/uL — AB (ref 4.22–5.81)
RDW: 15.5 % (ref 11.5–15.5)
WBC: 6.8 10*3/uL (ref 4.0–10.5)

## 2013-07-11 LAB — PROTIME-INR
INR: 1.09 (ref 0.00–1.49)
Prothrombin Time: 13.9 seconds (ref 11.6–15.2)

## 2013-07-11 LAB — BASIC METABOLIC PANEL
BUN: 70 mg/dL — ABNORMAL HIGH (ref 6–23)
CALCIUM: 7.4 mg/dL — AB (ref 8.4–10.5)
CHLORIDE: 99 meq/L (ref 96–112)
CO2: 23 meq/L (ref 19–32)
Creatinine, Ser: 15.19 mg/dL — ABNORMAL HIGH (ref 0.50–1.35)
GFR calc Af Amer: 4 mL/min — ABNORMAL LOW (ref >90)
GFR calc non Af Amer: 3 mL/min — ABNORMAL LOW (ref >90)
GLUCOSE: 89 mg/dL (ref 70–99)
Potassium: 3.9 mEq/L (ref 3.7–5.3)
SODIUM: 144 meq/L (ref 137–147)

## 2013-07-11 LAB — HEPARIN LEVEL (UNFRACTIONATED)
Heparin Unfractionated: 0.3 IU/mL (ref 0.30–0.70)
Heparin Unfractionated: 0.4 IU/mL (ref 0.30–0.70)

## 2013-07-11 SURGERY — ECHOCARDIOGRAM, TRANSESOPHAGEAL
Anesthesia: General

## 2013-07-11 MED ORDER — RENA-VITE PO TABS
1.0000 | ORAL_TABLET | Freq: Every day | ORAL | Status: DC
  Administered 2013-07-11 – 2013-07-15 (×5): 1 via ORAL
  Filled 2013-07-11 (×6): qty 1

## 2013-07-11 MED ORDER — MIDAZOLAM HCL 10 MG/2ML IJ SOLN
INTRAMUSCULAR | Status: DC | PRN
  Administered 2013-07-11 (×2): 2 mg via INTRAVENOUS

## 2013-07-11 MED ORDER — DARBEPOETIN ALFA-POLYSORBATE 25 MCG/0.42ML IJ SOLN
25.0000 ug | INTRAMUSCULAR | Status: DC
  Filled 2013-07-11: qty 0.42

## 2013-07-11 MED ORDER — PANTOPRAZOLE SODIUM 40 MG PO TBEC
40.0000 mg | DELAYED_RELEASE_TABLET | Freq: Every day | ORAL | Status: DC
  Administered 2013-07-11 – 2013-07-16 (×6): 40 mg via ORAL
  Filled 2013-07-11 (×6): qty 1

## 2013-07-11 MED ORDER — WARFARIN VIDEO: Freq: Once | Status: DC

## 2013-07-11 MED ORDER — MIDAZOLAM HCL 5 MG/ML IJ SOLN
INTRAMUSCULAR | Status: AC
  Filled 2013-07-11: qty 2

## 2013-07-11 MED ORDER — MENTHOL 3 MG MT LOZG
1.0000 | LOZENGE | OROMUCOSAL | Status: DC | PRN
  Administered 2013-07-11: 3 mg via ORAL
  Filled 2013-07-11 (×2): qty 9

## 2013-07-11 MED ORDER — SODIUM CHLORIDE 0.9 % IV BOLUS (SEPSIS)
250.0000 mL | Freq: Once | INTRAVENOUS | Status: AC
  Administered 2013-07-11: 250 mL via INTRAVENOUS

## 2013-07-11 MED ORDER — LIDOCAINE VISCOUS 2 % MT SOLN
OROMUCOSAL | Status: AC
  Filled 2013-07-11: qty 15

## 2013-07-11 MED ORDER — GI COCKTAIL ~~LOC~~
30.0000 mL | Freq: Once | ORAL | Status: DC
  Filled 2013-07-11: qty 30

## 2013-07-11 MED ORDER — CALCIUM CARBONATE ANTACID 500 MG PO CHEW
1.0000 | CHEWABLE_TABLET | Freq: Three times a day (TID) | ORAL | Status: DC | PRN
  Filled 2013-07-11: qty 1

## 2013-07-11 MED ORDER — FENTANYL CITRATE 0.05 MG/ML IJ SOLN
INTRAMUSCULAR | Status: DC | PRN
  Administered 2013-07-11: 25 ug via INTRAVENOUS

## 2013-07-11 MED ORDER — LIDOCAINE VISCOUS 2 % MT SOLN
OROMUCOSAL | Status: DC | PRN
  Administered 2013-07-11: 20 mL via OROMUCOSAL

## 2013-07-11 MED ORDER — PATIENT'S GUIDE TO USING COUMADIN BOOK
Freq: Once | Status: AC
  Administered 2013-07-11: 09:00:00
  Filled 2013-07-11: qty 1

## 2013-07-11 MED ORDER — WARFARIN SODIUM 10 MG PO TABS
10.0000 mg | ORAL_TABLET | Freq: Once | ORAL | Status: AC
  Administered 2013-07-11: 10 mg via ORAL
  Filled 2013-07-11 (×2): qty 1

## 2013-07-11 MED ORDER — CALCITRIOL 0.5 MCG PO CAPS
0.5000 ug | ORAL_CAPSULE | Freq: Every day | ORAL | Status: DC
  Administered 2013-07-12 – 2013-07-16 (×5): 0.5 ug via ORAL
  Filled 2013-07-11 (×6): qty 1

## 2013-07-11 MED ORDER — SODIUM CHLORIDE 0.9 % IV BOLUS (SEPSIS)
500.0000 mL | Freq: Once | INTRAVENOUS | Status: DC
Start: 2013-07-11 — End: 2013-07-11

## 2013-07-11 MED ORDER — SIMETHICONE 80 MG PO CHEW
80.0000 mg | CHEWABLE_TABLET | Freq: Four times a day (QID) | ORAL | Status: DC | PRN
  Filled 2013-07-11: qty 1

## 2013-07-11 MED ORDER — FENTANYL CITRATE 0.05 MG/ML IJ SOLN
INTRAMUSCULAR | Status: AC
  Filled 2013-07-11: qty 2

## 2013-07-11 MED ORDER — LANTHANUM CARBONATE 500 MG PO CHEW
2000.0000 mg | CHEWABLE_TABLET | Freq: Three times a day (TID) | ORAL | Status: DC
  Administered 2013-07-11 – 2013-07-16 (×11): 2000 mg via ORAL
  Filled 2013-07-11 (×17): qty 4

## 2013-07-11 NOTE — Progress Notes (Addendum)
ANTICOAGULATION CONSULT NOTE   Pharmacy Consult for Heparin/Coumadin Indication: atrial fibrillation  Allergies  Allergen Reactions  . Renvela [Sevelamer] Nausea And Vomiting    Patient Measurements: Height: 6\' 2"  (188 cm) Weight: 302 lb 7.5 oz (137.2 kg) IBW/kg (Calculated) : 82.2 Heparin Dosing Weight: 114 kg   Vital Signs: Temp: 98 F (36.7 C) (04/23 0333) Temp src: Oral (04/23 0333) BP: 102/75 mmHg (04/23 0333) Pulse Rate: 52 (04/23 0301)  Labs:  Recent Labs  07/10/13 1631 07/10/13 1637 07/10/13 1844 07/11/13 0030 07/11/13 0315  HGB 11.1*  --   --   --  10.1*  HCT 35.8*  --   --   --  32.8*  PLT 143*  --   --   --  129*  LABPROT  --   --   --   --  13.9  INR  --   --   --   --  1.09  HEPARINUNFRC  --   --   --   --  0.30  CREATININE 14.31*  --   --   --  15.19*  TROPONINI  --  0.32* 0.31* <0.30  --     Estimated Creatinine Clearance: 8.3 ml/min (by C-G formula based on Cr of 15.19).  Assessment: 53 yo male with Aflutter for anticoagulation.  Heparin currently infusing at 1950 units/hr.  Goal of Therapy:  INR 2-3 Heparin level 0.3-0.7 units/ml Monitor platelets by anticoagulation protocol: Yes   Plan:  Increase Heparin 2100 units/hr to keep in range Check heparin level in 8 hours.  Coumadin 10 mg today  Geannie Risen, PharmD, BCPS

## 2013-07-11 NOTE — Progress Notes (Signed)
ANTICOAGULATION CONSULT NOTE   Pharmacy Consult for Heparin Indication: atrial fibrillation  Allergies  Allergen Reactions  . Renvela [Sevelamer] Nausea And Vomiting    Patient Measurements: Height: 6\' 2"  (188 cm) Weight: 302 lb 7.5 oz (137.2 kg) IBW/kg (Calculated) : 82.2 Heparin Dosing Weight: 114 kg   Vital Signs: Temp: 97.9 F (36.6 C) (04/23 0830) Temp src: Oral (04/23 0830) BP: 103/69 mmHg (04/23 1200) Pulse Rate: 67 (04/23 1200)  Labs:  Recent Labs  07/10/13 1631  07/10/13 1844 07/11/13 0030 07/11/13 0315 07/11/13 0823 07/11/13 1130  HGB 11.1*  --   --   --  10.1*  --   --   HCT 35.8*  --   --   --  32.8*  --   --   PLT 143*  --   --   --  129*  --   --   LABPROT  --   --   --   --  13.9  --   --   INR  --   --   --   --  1.09  --   --   HEPARINUNFRC  --   --   --   --  0.30  --  0.40  CREATININE 14.31*  --   --   --  15.19*  --   --   TROPONINI  --   < > 0.31* <0.30  --  <0.30  --   < > = values in this interval not displayed.  Estimated Creatinine Clearance: 8.3 ml/min (by C-G formula based on Cr of 15.19).  Assessment: 27 YOM with h/o of Afib and CKD on HD presented to the ED with increasing heart rate over past several days. Pharmacy to start heparin drip for atrial flutter with RVR with plan for cardioversion.   Heparin is noted at goal on 2100 units/hr (HL= 0.4)   Goal of Therapy:  INR 2-3 Heparin level 0.3-0.7 units/ml Monitor platelets by anticoagulation protocol: Yes   Plan:  -No heparin changes needed -Daily heparin level and CBC  Harland German, Pharm D 07/11/2013 1:26 PM

## 2013-07-11 NOTE — Interval H&P Note (Signed)
History and Physical Interval Note:  07/11/2013 2:13 PM  Gary Rivera  has presented today for surgery, with the diagnosis of a-fib  The various methods of treatment have been discussed with the patient and family. After consideration of risks, benefits and other options for treatment, the patient has consented to  Procedure(s): TRANSESOPHAGEAL ECHOCARDIOGRAM (TEE) (N/A) CARDIOVERSION (N/A) as a surgical intervention .  The patient's history has been reviewed, patient examined, no change in status, stable for surgery.  I have reviewed the patient's chart and labs.  Questions were answered to the patient's satisfaction.     Pricilla Riffle

## 2013-07-11 NOTE — H&P (View-Only) (Signed)
Subjective:  No complaints- he says he was admitted because his HR was high.  Objective:  Vital Signs in the last 24 hours: Temp:  [97.9 F (36.6 C)-98.5 F (36.9 C)] 97.9 F (36.6 C) (04/23 0830) Pulse Rate:  [38-150] 95 (04/23 0830) Resp:  [8-27] 18 (04/23 0830) BP: (75-122)/(41-108) 98/63 mmHg (04/23 0830) SpO2:  [90 %-100 %] 98 % (04/23 0830) Weight:  [302 lb 7.5 oz (137.2 kg)-303 lb 12.7 oz (137.8 kg)] 302 lb 7.5 oz (137.2 kg) (04/23 0333)  Intake/Output from previous day:  Intake/Output Summary (Last 24 hours) at 07/11/13 1116 Last data filed at 07/11/13 1000  Gross per 24 hour  Intake    562 ml  Output      0 ml  Net    562 ml    Physical Exam: General appearance: alert, cooperative and morbidly obese Lungs: decreased breath sounds Heart: irregularly irregular rhythm   Rate: 90  Rhythm: atrial flutter  Lab Results:  Recent Labs  07/10/13 1631 07/11/13 0315  WBC 7.3 6.8  HGB 11.1* 10.1*  PLT 143* 129*    Recent Labs  07/10/13 1631 07/11/13 0315  NA 148* 144  K 4.1 3.9  CL 101 99  CO2 24 23  GLUCOSE 95 89  BUN 65* 70*  CREATININE 14.31* 15.19*    Recent Labs  07/11/13 0030 07/11/13 0823  TROPONINI <0.30 <0.30    Recent Labs  07/11/13 0315  INR 1.09    Imaging: Imaging results have been reviewed  Cardiac Studies:  Assessment/Plan:  53 y/o male with CKD, and NICM. Admitted with rapid HR, noted to be in A flutter.    Principal Problem:   Atrial flutter with rapid ventricular response Active Problems:   Chronic systolic congestive heart failure, NYHA class 1   Pulmonary HTN- 50 mmHg 2011 ECHO   Mitral regurgitation (MODERATE)   Obesity hypoventilation syndrome   End stage renal disease-  on HD   NICM- recent EF "Nl" per pt- [facility]   HTN (hypertension)   Anemia   Obesity (BMI 30-39.9)   Hypotension    PLAN: Will review with MD- TEE CV scheduled for 1400 with Dr Tenny Crawoss- he has been NPO. He will need at least  one month of Coumadin.   Corine ShelterLuke Kilroy PA-C Beeper [phone] 07/11/2013, 11:16 AM   I have seen and examined the patient along with Corine ShelterLuke Kilroy, PA.  I have reviewed the chart, notes and new data.  I agree with PA's note.  Key new complaints: feels well at rest; ROS does not suggest OSA Key examination changes: AFlutter with variable AV block, body habitus strongly suggestive of OSA - broad neck, crowded oropharynx (Mallampati 4), obesity Key new findings / data: bedside echo review shows LVEF around 40%. The right heart chambers are enlarged, he has moderate TR and pulmonary HTN  PLAN: Discussed need for long term anticoagulation to reduce stroke risk. He commits to 30 days after cardioversion, but doesn't know about long term use - he will discuss again with his cardiologist, Dr. Ranae PlumberBupadhia. Recommend TEE guided cardioversion, hopefully today. This procedure has been fully reviewed with the patient and informed consent has been obtained. Antiarrhythmic therapy or ablation should be considered based on arrhythmia prevalence - this appears to be his first arrhythmic event. Few options for antiarrhythmic drugs due to ESRD and cardiomyopathy. Despite the lack of symptoms of OSA, I think a sleep study should be performed based on right heart abnormalities. He  seems to have convincing evidence for obesity hypoventilation syndrome at least. Borderline increase in troponin level is highly nonspecific in setting of ESRD and tachyarrhythmia. No evidence to support acute coronary syndrome.   Thurmon Fair, MD, [facility] [phone] 07/11/2013, 11:34 AM

## 2013-07-11 NOTE — Op Note (Signed)
Patient anesthetized by anesthesia with 40 mg Lidocaine and 50 mg Propofol With pads at apex/base patient cardiovrted to SR with 200 J synchronized biphasic energy Procedure without complication.

## 2013-07-11 NOTE — Anesthesia Postprocedure Evaluation (Signed)
  Anesthesia Post-op Note  Patient: Gary Rivera  Procedure(s) Performed: Procedure(s): TRANSESOPHAGEAL ECHOCARDIOGRAM (TEE) (N/A) CARDIOVERSION (N/A)  Patient Location: PACU  Anesthesia Type:General  Level of Consciousness: awake and alert   Airway and Oxygen Therapy: Patient Spontanous Breathing  Post-op Pain: none  Post-op Assessment: Post-op Vital signs reviewed, Patient's Cardiovascular Status Stable and Respiratory Function Stable  Post-op Vital Signs: Reviewed  Filed Vitals:   07/11/13 1520  BP: 90/53  Pulse: 77  Temp:   Resp: 20    Complications: No apparent anesthesia complications

## 2013-07-11 NOTE — Anesthesia Preprocedure Evaluation (Signed)
Anesthesia Evaluation  Patient identified by MRN, date of birth, ID band Patient awake    Reviewed: Allergy & Precautions, H&P , NPO status , Patient's Chart, lab work & pertinent test results  Airway Mallampati: II TM Distance: >3 FB Neck ROM: Full    Dental no notable dental hx. (+) Teeth Intact, Dental Advisory Given   Pulmonary neg pulmonary ROS,  breath sounds clear to auscultation  Pulmonary exam normal       Cardiovascular hypertension, On Medications +CHF Rhythm:Irregular Rate:Tachycardia     Neuro/Psych PSYCHIATRIC DISORDERS negative neurological ROS  negative psych ROS   GI/Hepatic negative GI ROS, Neg liver ROS,   Endo/Other  Morbid obesity  Renal/GU DialysisRenal disease  negative genitourinary   Musculoskeletal   Abdominal   Peds  Hematology negative hematology ROS (+)   Anesthesia Other Findings   Reproductive/Obstetrics negative OB ROS                           Anesthesia Physical Anesthesia Plan  ASA: III  Anesthesia Plan: General   Post-op Pain Management:    Induction: Intravenous  Airway Management Planned: Mask  Additional Equipment:   Intra-op Plan:   Post-operative Plan:   Informed Consent: I have reviewed the patients History and Physical, chart, labs and discussed the procedure including the risks, benefits and alternatives for the proposed anesthesia with the patient or authorized representative who has indicated his/her understanding and acceptance.   Dental advisory given  Plan Discussed with: CRNA  Anesthesia Plan Comments:         Anesthesia Quick Evaluation

## 2013-07-11 NOTE — Progress Notes (Signed)
Gary Rivera - Stepdown / ICU Progress Note  Gary Rivera OEH:[id number] DOB: [birth year] DOA: 07/10/2013 PCP: Gary Livings, MD  Time spent :  Brief narrative: 53 yo BM  PMHx PHTN, Obesity Hypoventilation Syn, HTN, Hypotension, ESRD on Home HD, A-Fib/Fluter w/ RVR now on home HD -Gary Rivera is primary Nephrologist. Presented to the ER with fast heart rate noted with routine VS check during HD. Pt endorses has been ongoing for ~ 2-3 days- only occasionally was aware of palpitations and light headed sensation.   In the ER his VR was 130-140 with underlying atrial flutter. He had a low grade temperature. He was started on a Cardizem gtt in the ED. Cardiology was consulted and recommended IV Heparin for anti-coagulation and consideration for DCCV if atrial flutter with RVR persists.   HPI/Subjective: No current awareness of palpitations. Pt clarified his nephrologist name and his baseline lab values for Hgb  Assessment/Plan: Active Problems:   Atrial flutter with rapid ventricular response -Cards following -HR jumps to 130-140 with sitting up in bed- resting was 90-100s -cont Cardizem gtt  -? Degree of mild volume depletion so 1x 250 cc NS bolus -ECHO pending- TEE today at 2p before proceeds with DCCV -Warfarin per pharmacy-continue IV heparin    CKD (chronic kidney disease) stage V requiring chronic dialysis -Nephro consulted -pt sts usually pulls off ~ 2.5 kg with each tx.-dry wt 134 kg    Hypotension -due to RVR nad possible mild volume depletion (BP dropped with initiation of cardizem drip)    Chronic systolic congestive heart failure, NYHA class Rivera -compensated and treated with HD    HTN (hypertension) -not on meds at home- primary control with HD    Pulmonary HTN- 50 mmHg 2011 ECHO/Obesity hypoventilation syndrome -need to clarify if on CPAP at home    Anemia of CKD -hgb has been variable ove the past 2 yrs- as low as 7-9 yet pt reports for at least Rivera yr around  11-12 -current 10-11    Mitral regurgitation (MODERATE)      DVT prophylaxis: IV heparin Code Status: Full Family Communication: No family at bedside Disposition Plan/Expected LOS: Stepdown   Consultants: Nephrology Cardiology  Procedures:  2-D echocardiogram pending   TEE pending    DCCV pending  Antibiotics:  none  Objective: Blood pressure 98/63, pulse 95, temperature 97.9 F (36.6 C), temperature source Oral, resp. rate 18, height 6\' 2"  (Rivera.88 m), weight 302 lb 7.5 oz (137.2 kg), SpO2 98.00%.  Intake/Output Summary (Last 24 hours) at 07/11/13 1058 Last data filed at 07/11/13 1000  Gross per 24 hour  Intake    562 ml  Output      0 ml  Net    562 ml     Exam: General: No acute respiratory distress Lungs: Clear to auscultation bilaterally without wheezes or crackles, RA Cardiovascular: Irregular rate and rhythm (atrial flutter) without murmur gallop or rub normal S1 and S2, no peripheral edema or JVD Abdomen: Nontender, nondistended, soft, bowel sounds positive, no rebound, no ascites, no appreciable mass Musculoskeletal: No significant cyanosis, clubbing of bilateral lower extremities Neurological: Alert and oriented x 3, moves all extremities x 4 without focal neurological deficits, CN 2-12 intact  Scheduled Meds:  Scheduled Meds: . calcitRIOL  0.25 mcg Oral Daily  . lanthanum  Rivera,000 mg Oral TID WC  . patient's guide to using coumadin book   Does not apply Once  . sodium chloride  3 mL Intravenous Q12H  . sodium  chloride  3 mL Intravenous Q12H  . warfarin  10 mg Oral ONCE-1800  . warfarin   Does not apply Once  . Warfarin - Pharmacist Dosing Inpatient   Does not apply q1800   Continuous Infusions: . diltiazem (CARDIZEM) infusion 7 mg/hr (07/11/13 1000)  . heparin 2,100 Units/hr (07/11/13 1000)    Data Reviewed: Basic Metabolic Panel:  Recent Labs Lab 07/10/13 1631 07/10/13 2200 07/11/13 0315  NA 148*  --  144  K 4.Rivera  --  3.9  CL 101  --  99   CO2 24  --  23  GLUCOSE 95  --  89  BUN 65*  --  70*  CREATININE 14.31*  --  15.19*  CALCIUM 7.7*  --  7.4*  MG  --  Rivera.8  --   PHOS  --  7.2*  --    Liver Function Tests: No results found for this basename: AST, ALT, ALKPHOS, BILITOT, PROT, ALBUMIN,  in the last 168 hours No results found for this basename: LIPASE, AMYLASE,  in the last 168 hours No results found for this basename: AMMONIA,  in the last 168 hours CBC:  Recent Labs Lab 07/10/13 1631 07/11/13 0315  WBC 7.3 6.8  HGB 11.Rivera* 10.Rivera*  HCT 35.8* 32.8*  MCV 97.0 97.3  PLT 143* 129*   Cardiac Enzymes:  Recent Labs Lab 07/10/13 1637 07/10/13 1844 07/11/13 0030 07/11/13 0823  TROPONINI 0.32* 0.31* <0.30 <0.30   BNP (last 3 results)  Recent Labs  04/29/13 0437  PROBNP 3309.0*   CBG: No results found for this basename: GLUCAP,  in the last 168 hours  Recent Results (from the past 240 hour(s))  MRSA PCR SCREENING     Status: None   Collection Time    07/10/13  8:13 PM      Result Value Ref Range Status   MRSA by PCR NEGATIVE  NEGATIVE Final   Comment:            The GeneXpert MRSA Assay (FDA     approved for NASAL specimens     only), is one component of a     comprehensive MRSA colonization     surveillance program. It is not     intended to diagnose MRSA     infection nor to guide or     monitor treatment for     MRSA infections.     Studies:  Recent x-ray studies have been reviewed in detail by the Attending Physician       Gary Rivera, ANP [facility] Office  [phone] Pager [phone]  **If unable to reach the above provider after paging please contact the Flow Manager @ [phone]  On-Call/Text Page:      Gary Rivera      password [email or web address]  If 7PM-7AM, please contact night-coverage [email or web address] Password [email or web address] 07/11/2013, 10:58 AM   LOS: Rivera day  Examined patient with ANP Gary Rivera reviewed assessment and plan and concur with plan. Plan discussed with patient and all  questions answered

## 2013-07-11 NOTE — Interval H&P Note (Signed)
History and Physical Interval Note:  07/11/2013 1:41 PM  Gary Rivera  has presented today for surgery, with the diagnosis of a-fib  The various methods of treatment have been discussed with the patient and family. After consideration of risks, benefits and other options for treatment, the patient has consented to  Procedure(s): TRANSESOPHAGEAL ECHOCARDIOGRAM (TEE) (N/A) CARDIOVERSION (N/A) as a surgical intervention .  The patient's history has been reviewed, patient examined, no change in status, stable for surgery.  I have reviewed the patient's chart and labs.  Questions were answered to the patient's satisfaction.     Pricilla Riffle

## 2013-07-11 NOTE — Op Note (Signed)
No LA/LAA thrombus   LVEF is severely depressed RVEF is severely depressed. Full report to follow in CV section.

## 2013-07-11 NOTE — Consult Note (Signed)
[facility] Renal Consultation Note    Indication for Consultation:  Management of ESRD/hemodialysis; anemia, hypertension/volume and secondary hyperparathyroidism  HPI: Gary Rivera is a 53 y.o. male ESRD patient on home hemodialysis and followed by Dr. [name] who was sent to the ED for evaluation of rapid heart rate found incidentally during a routine office visit. Admission EKG was significant for 2:1 atrial flutter with inferolateral ischemia. Cardiology was consulted. He was subsequently placed of IV diltiazem, heparin drip for stroke prophylaxis, with plans for TEE and possible cardioversion. He remains asymptomatic. His last home HD treatment was 07/09/13.  Past Medical History  Diagnosis Date  . Hypertension   . Renal disorder   . CHF (congestive heart failure)   . Peritoneal dialysis catheter in place   . Anemia     patient reporats that last hgb 7.9 a week ago  . Anxiety state, unspecified    Past Surgical History  Procedure Laterality Date  . Appendectomy    . Peritoneal catheter insertion     Family History  Problem Relation Age of Onset  . Diabetes Mother   . Hypertension Mother   . Hypertension Sister   . Asthma Sister    Social History:  reports that he has never smoked. He has never used smokeless tobacco. He reports that he does not drink alcohol or use illicit drugs. Allergies  Allergen Reactions  . Renvela [Sevelamer] Nausea And Vomiting   Prior to Admission medications   Medication Sig Start Date End Date Taking? Authorizing Provider  albuterol (PROVENTIL HFA;VENTOLIN HFA) 108 (90 BASE) MCG/ACT inhaler Inhale 1 puff into the lungs every 6 (six) hours as needed for wheezing or shortness of breath.   Yes Historical Provider, MD  aspirin EC 81 MG tablet Take 81 mg by mouth daily.   Yes Historical Provider, MD  calcitRIOL (ROCALTROL) 0.25 MCG capsule Take 0.25 mcg by mouth daily.   Yes Historical Provider, MD  clonazePAM (KLONOPIN) 0.5 MG  tablet Take 0.5 mg by mouth 2 (two) times daily as needed. anxiety   Yes Historical Provider, MD  lanthanum (FOSRENOL) 1000 MG chewable tablet Chew 1,000 mg by mouth 3 (three) times daily with meals. Twice daily with meals and 1 with snacks   Yes Historical Provider, MD  oxyCODONE-acetaminophen (PERCOCET/ROXICET) 5-325 MG per tablet Take 1-2 tablets by mouth every 4 (four) hours as needed (severe pain). 07/16/12  Yes Zannie Cove, MD   Current Facility-Administered Medications  Medication Dose Route Frequency Provider Last Rate Last Dose  . 0.9 %  sodium chloride infusion  250 mL Intravenous PRN Maxie Barb, MD      . calcitRIOL (ROCALTROL) capsule 0.25 mcg  0.25 mcg Oral Daily Maxie Barb, MD   0.25 mcg at 07/11/13 1000  . diltiazem (CARDIZEM) 100 mg in dextrose 5 % 100 mL infusion  5-15 mg/hr Intravenous Continuous Lyanne Co, MD 7 mL/hr at 07/11/13 1000 7 mg/hr at 07/11/13 1000  . heparin ADULT infusion 100 units/mL (25000 units/250 mL)  2,100 Units/hr Intravenous Continuous Maxie Barb, MD 21 mL/hr at 07/11/13 1000 2,100 Units/hr at 07/11/13 1000  . lanthanum (FOSRENOL) chewable tablet 1,000 mg  1,000 mg Oral TID WC Maxie Barb, MD      . oxyCODONE-acetaminophen (PERCOCET/ROXICET) 5-325 MG per tablet 1-2 tablet  1-2 tablet Oral Q4H PRN Maxie Barb, MD      . patient's guide to using coumadin book   Does not apply Once Drema Dallas,  MD      . sodium chloride 0.9 % injection 3 mL  3 mL Intravenous Q12H Maxie Barb, MD      . sodium chloride 0.9 % injection 3 mL  3 mL Intravenous Q12H Maxie Barb, MD      . sodium chloride 0.9 % injection 3 mL  3 mL Intravenous PRN Maxie Barb, MD      . warfarin (COUMADIN) tablet 10 mg  10 mg Oral ONCE-1800 Maxie Barb, MD      . warfarin (COUMADIN) video   Does not apply Once Drema Dallas, MD      . Warfarin - Pharmacist Dosing Inpatient   Does not apply q1800 Sampson Si, [facility]       Labs: Basic Metabolic  Panel:  Recent Labs Lab 07/10/13 1631 07/10/13 2200 07/11/13 0315  NA 148*  --  144  K 4.1  --  3.9  CL 101  --  99  CO2 24  --  23  GLUCOSE 95  --  89  BUN 65*  --  70*  CREATININE 14.31*  --  15.19*  CALCIUM 7.7*  --  7.4*  PHOS  --  7.2*  --    CBC:  Recent Labs Lab 07/10/13 1631 07/11/13 0315  WBC 7.3 6.8  HGB 11.1* 10.1*  HCT 35.8* 32.8*  MCV 97.0 97.3  PLT 143* 129*   Cardiac Enzymes:  Recent Labs Lab 07/10/13 1637 07/10/13 1844 07/11/13 0030 07/11/13 0823  TROPONINI 0.32* 0.31* <0.30 <0.30   Studies/Results: Dg Chest Portable 1 View  07/10/2013   CLINICAL DATA:  Bronchitis, hypertension, tachycardia  EXAM: PORTABLE CHEST - 1 VIEW  COMPARISON:  05/08/2013  FINDINGS: Stable cardiomegaly with vascular congestion. No focal pneumonia, collapse or consolidation. No edema, effusion or pneumothorax. Trachea midline.  IMPRESSION: Stable cardiomegaly with vascular congestion. No superimposed acute process.   Electronically Signed   By: Ruel Favors M.D.   On: 07/10/2013 17:03    ROS: 10 pt ROS asked and answered. All systems negative except as in HPI above   Physical Exam: Filed Vitals:   07/11/13 0333 07/11/13 0500 07/11/13 0830 07/11/13 1200  BP: 102/75 102/51 98/63 103/69  Pulse:  38 95 67  Temp: 98 F (36.7 C)  97.9 F (36.6 C)   TempSrc: Oral  Oral   Resp:  19 18 16   Height:      Weight: 137.2 kg (302 lb 7.5 oz)     SpO2: 100% 99% 98% 93%     General: Well developed, well nourished, in no acute distress. Head: Normocephalic, atraumatic, sclera non-icteric, mucus membranes are moist Neck: Supple. JVD not elevated. Lungs: Diminished. Bibasilar crackles. No wheezes, rales, or rhonchi. Breathing is unlabored. Heart: Irreg Irreg. No murmurs, rubs, or gallops appreciated. Atrial flutter in 80s on telemetry Abdomen: Obese, Soft, non-tender, normoactive bowel sounds. No rebound/guarding. No obvious abdominal masses. M-S:  Strength and tone appear normal  for age. Lower extremities: Trace LE  Edema. No ischemic changes or open wounds  Neuro: Alert and oriented X 3. Moves all extremities spontaneously. Psych:  Responds to questions appropriately with a normal affect. Dialysis Access: LFA AVF buttonhole, + bruit  Dialysis Orders: NxStage - 5 x week - [facility] EDW 134 HD Heparin 5000 u. Access LFA AVF buttonhole  BFR 400  Calcitriol 0.27mcg PO/QD Epogen 0   Units IV/HD  Venofer 200 mg q 4 weeks  Op labs 4/14: Hgb 12.1,  Tsat 31%, Phos 6.8, PTH 603  Assessment/Plan: 1. A-Flutter/RVR - Mgmt per cardiology. No clear precipitant. Asymptomatic. Troponin elevated. IV Diltiazem and heparin drip initiated. Plan for TEE with possible cardioversion in the morning. Anticoagulation per pharmacy. 2. ESRD -  Home hemo. Will be TTS here. K+ 3.9. HD today, no heparin 3. Hypotension/volume  - SBPs soft. No home BP meds. Pulm vascular congestion on CXR. Up about 3kg here with volume excess on exam. UF as BP tolerates. 4. Anemia  - Hgb 10.1 < 12.1 on 4/14 per op labs. Question large drop? Start Aranesp 25. Stool cards ordered. Last Tsat 31% on op IV Fe monthly.  5. Metabolic bone disease -  Ca 7.4. Phos 7.2. Last PTH 603. Increase Fosrenol to home dose of 2g ac and increase calcitriol to 0.5 mcg QD. 6. Nutrition - Last alb 4.2. NPO pending TEE. Renal diet when appropriate. Multivit  Claud KelpKaren Warren, PA-C [facility] Pager [phone] 07/11/2013, 12:16 PM   Pt seen, examined and agree w A/P as above.  Vinson Moselleob Kennadi Albany MD pager [phone]    cell [phone] 07/11/2013, 1:51 PM

## 2013-07-11 NOTE — Progress Notes (Addendum)
  Subjective:  No complaints- he says he was admitted because his HR was high.  Objective:  Vital Signs in the last 24 hours: Temp:  [97.9 F (36.6 C)-98.5 F (36.9 C)] 97.9 F (36.6 C) (04/23 0830) Pulse Rate:  [38-150] 95 (04/23 0830) Resp:  [8-27] 18 (04/23 0830) BP: (75-122)/(41-108) 98/63 mmHg (04/23 0830) SpO2:  [90 %-100 %] 98 % (04/23 0830) Weight:  [302 lb 7.5 oz (137.2 kg)-303 lb 12.7 oz (137.8 kg)] 302 lb 7.5 oz (137.2 kg) (04/23 0333)  Intake/Output from previous day:  Intake/Output Summary (Last 24 hours) at 07/11/13 1116 Last data filed at 07/11/13 1000  Gross per 24 hour  Intake    562 ml  Output      0 ml  Net    562 ml    Physical Exam: General appearance: alert, cooperative and morbidly obese Lungs: decreased breath sounds Heart: irregularly irregular rhythm   Rate: 90  Rhythm: atrial flutter  Lab Results:  Recent Labs  07/10/13 1631 07/11/13 0315  WBC 7.3 6.8  HGB 11.1* 10.1*  PLT 143* 129*    Recent Labs  07/10/13 1631 07/11/13 0315  NA 148* 144  K 4.1 3.9  CL 101 99  CO2 24 23  GLUCOSE 95 89  BUN 65* 70*  CREATININE 14.31* 15.19*    Recent Labs  07/11/13 0030 07/11/13 0823  TROPONINI <0.30 <0.30    Recent Labs  07/11/13 0315  INR 1.09    Imaging: Imaging results have been reviewed  Cardiac Studies:  Assessment/Plan:  52 y/o male with CKD, and NICM. Admitted with rapid HR, noted to be in A flutter.    Principal Problem:   Atrial flutter with rapid ventricular response Active Problems:   Chronic systolic congestive heart failure, NYHA class 1   Pulmonary HTN- 50 mmHg 2011 ECHO   Mitral regurgitation (MODERATE)   Obesity hypoventilation syndrome   End stage renal disease-  on HD   NICM- recent EF "Nl" per pt- followed at [facility]   HTN (hypertension)   Anemia   Obesity (BMI 30-39.9)   Hypotension    PLAN: Will review with MD- TEE CV scheduled for 1400 with Dr Ross- he has been NPO. He will need at least  one month of Coumadin.   Luke Kilroy PA-C Beeper [phone] 07/11/2013, 11:16 AM   I have seen and examined the patient along with Luke Kilroy, PA.  I have reviewed the chart, notes and new data.  I agree with PA's note.  Key new complaints: feels well at rest; ROS does not suggest OSA Key examination changes: AFlutter with variable AV block, body habitus strongly suggestive of OSA - broad neck, crowded oropharynx (Mallampati 4), obesity Key new findings / data: bedside echo review shows LVEF around 40%. The right heart chambers are enlarged, he has moderate TR and pulmonary HTN  PLAN: Discussed need for long term anticoagulation to reduce stroke risk. He commits to 30 days after cardioversion, but doesn't know about long term use - he will discuss again with his cardiologist, Dr. Bupadhia. Recommend TEE guided cardioversion, hopefully today. This procedure has been fully reviewed with the patient and informed consent has been obtained. Antiarrhythmic therapy or ablation should be considered based on arrhythmia prevalence - this appears to be his first arrhythmic event. Few options for antiarrhythmic drugs due to ESRD and cardiomyopathy. Despite the lack of symptoms of OSA, I think a sleep study should be performed based on right heart abnormalities. He   seems to have convincing evidence for obesity hypoventilation syndrome at least. Borderline increase in troponin level is highly nonspecific in setting of ESRD and tachyarrhythmia. No evidence to support acute coronary syndrome.   Thurmon Fair, MD, [facility] [phone] 07/11/2013, 11:34 AM

## 2013-07-11 NOTE — Transfer of Care (Signed)
Immediate Anesthesia Transfer of Care Note  Patient: Gary Rivera  Procedure(s) Performed: Procedure(s): TRANSESOPHAGEAL ECHOCARDIOGRAM (TEE) (N/A) CARDIOVERSION (N/A)  Patient Location: PACU and Endoscopy Unit  Anesthesia Type:General  Level of Consciousness: awake, alert  and oriented  Airway & Oxygen Therapy: Patient Spontanous Breathing and Patient connected to nasal cannula oxygen  Post-op Assessment: Report given to PACU RN and Post -op Vital signs reviewed and stable  Post vital signs: Reviewed and stable  Complications: No apparent anesthesia complications

## 2013-07-11 NOTE — Progress Notes (Signed)
Echocardiogram Echocardiogram Transesophageal has been performed.  Gary Rivera 07/11/2013, 2:24 PM

## 2013-07-11 NOTE — Progress Notes (Signed)
  Echocardiogram 2D Echocardiogram has been performed.  Arvil Chaco 07/11/2013, 11:40 AM

## 2013-07-12 ENCOUNTER — Encounter (HOSPITAL_COMMUNITY): Payer: Self-pay | Admitting: Internal Medicine

## 2013-07-12 DIAGNOSIS — E669 Obesity, unspecified: Secondary | ICD-10-CM

## 2013-07-12 DIAGNOSIS — J9600 Acute respiratory failure, unspecified whether with hypoxia or hypercapnia: Secondary | ICD-10-CM

## 2013-07-12 LAB — CBC
HCT: 30.1 % — ABNORMAL LOW (ref 39.0–52.0)
HEMATOCRIT: 31.3 % — AB (ref 39.0–52.0)
HEMOGLOBIN: 9.6 g/dL — AB (ref 13.0–17.0)
Hemoglobin: 9.4 g/dL — ABNORMAL LOW (ref 13.0–17.0)
MCH: 30 pg (ref 26.0–34.0)
MCH: 30.4 pg (ref 26.0–34.0)
MCHC: 30.7 g/dL (ref 30.0–36.0)
MCHC: 31.2 g/dL (ref 30.0–36.0)
MCV: 97.8 fL (ref 78.0–100.0)
MCV: 97.4 fL (ref 78.0–100.0)
PLATELETS: 127 10*3/uL — AB (ref 150–400)
Platelets: 129 10*3/uL — ABNORMAL LOW (ref 150–400)
RBC: 3.2 MIL/uL — AB (ref 4.22–5.81)
RBC: 3.09 MIL/uL — AB (ref 4.22–5.81)
RDW: 15.7 % — ABNORMAL HIGH (ref 11.5–15.5)
RDW: 15.7 % — ABNORMAL HIGH (ref 11.5–15.5)
WBC: 7.1 10*3/uL (ref 4.0–10.5)
WBC: 7.6 10*3/uL (ref 4.0–10.5)

## 2013-07-12 LAB — RENAL FUNCTION PANEL
Albumin: 3.3 g/dL — ABNORMAL LOW (ref 3.5–5.2)
BUN: 79 mg/dL — AB (ref 6–23)
CO2: 19 mEq/L (ref 19–32)
CREATININE: 18.17 mg/dL — AB (ref 0.50–1.35)
Calcium: 6.8 mg/dL — ABNORMAL LOW (ref 8.4–10.5)
Chloride: 100 mEq/L (ref 96–112)
GFR calc Af Amer: 3 mL/min — ABNORMAL LOW (ref >90)
GFR calc non Af Amer: 3 mL/min — ABNORMAL LOW (ref >90)
Glucose, Bld: 110 mg/dL — ABNORMAL HIGH (ref 70–99)
Phosphorus: 9.9 mg/dL — ABNORMAL HIGH (ref 2.3–4.6)
Potassium: 5.4 mEq/L — ABNORMAL HIGH (ref 3.7–5.3)
Sodium: 143 mEq/L (ref 137–147)

## 2013-07-12 LAB — HEPARIN LEVEL (UNFRACTIONATED)
HEPARIN UNFRACTIONATED: 0.27 [IU]/mL — AB (ref 0.30–0.70)
HEPARIN UNFRACTIONATED: 0.47 [IU]/mL (ref 0.30–0.70)

## 2013-07-12 LAB — PROTIME-INR
INR: 1.4 (ref 0.00–1.49)
Prothrombin Time: 16.8 seconds — ABNORMAL HIGH (ref 11.6–15.2)

## 2013-07-12 LAB — IRON: Iron: 29 ug/dL — ABNORMAL LOW (ref 42–135)

## 2013-07-12 LAB — HEPATITIS B SURFACE ANTIGEN: Hepatitis B Surface Ag: NEGATIVE

## 2013-07-12 MED ORDER — CLONAZEPAM 0.5 MG PO TABS
0.5000 mg | ORAL_TABLET | Freq: Two times a day (BID) | ORAL | Status: DC | PRN
  Administered 2013-07-12 – 2013-07-15 (×5): 0.5 mg via ORAL
  Filled 2013-07-12 (×5): qty 1

## 2013-07-12 MED ORDER — ALBUTEROL SULFATE (2.5 MG/3ML) 0.083% IN NEBU: 2.5000 mg | INHALATION_SOLUTION | Freq: Four times a day (QID) | RESPIRATORY_TRACT | Status: DC | PRN

## 2013-07-12 MED ORDER — WARFARIN SODIUM 10 MG PO TABS
10.0000 mg | ORAL_TABLET | Freq: Once | ORAL | Status: AC
  Administered 2013-07-12: 10 mg via ORAL
  Filled 2013-07-12: qty 1

## 2013-07-12 MED ORDER — ONDANSETRON HCL 4 MG/2ML IJ SOLN
4.0000 mg | Freq: Three times a day (TID) | INTRAMUSCULAR | Status: DC | PRN
  Administered 2013-07-12 – 2013-07-13 (×2): 4 mg via INTRAVENOUS
  Filled 2013-07-12 (×2): qty 2

## 2013-07-12 MED ORDER — CARVEDILOL 3.125 MG PO TABS
3.1250 mg | ORAL_TABLET | Freq: Two times a day (BID) | ORAL | Status: DC
  Administered 2013-07-12 – 2013-07-13 (×2): 3.125 mg via ORAL
  Filled 2013-07-12 (×10): qty 1

## 2013-07-12 MED ORDER — ALBUTEROL SULFATE (2.5 MG/3ML) 0.083% IN NEBU: 3.0000 mL | INHALATION_SOLUTION | Freq: Four times a day (QID) | RESPIRATORY_TRACT | Status: DC | PRN

## 2013-07-12 NOTE — Procedures (Signed)
I was present at this dialysis session, have reviewed the session itself and made  appropriate changes  [name] MD (pgr) [phone]    ([phone] 07/12/2013, 1:29 PM

## 2013-07-12 NOTE — Progress Notes (Signed)
ANTICOAGULATION CONSULT NOTE - Follow Up Consult  Pharmacy Consult for heparin, coumadin Indication: atrial fibrillation  Labs:  Recent Labs  07/10/13 1631  07/10/13 1844 07/11/13 0030  07/11/13 0315 07/11/13 0823 07/11/13 1130 07/12/13 0240 07/12/13 1200  HGB 11.1*  --   --   --   --  10.1*  --   --  9.6*  --   HCT 35.8*  --   --   --   --  32.8*  --   --  31.3*  --   PLT 143*  --   --   --   --  129*  --   --  129*  --   LABPROT  --   --   --   --   --  13.9  --   --   --  16.8*  INR  --   --   --   --   --  1.09  --   --   --  1.40  HEPARINUNFRC  --   --   --   --   < > 0.30  --  0.40 0.27* 0.47  CREATININE 14.31*  --   --   --   --  15.19*  --   --   --   --   TROPONINI  --   < > 0.31* <0.30  --   --  <0.30  --   --   --   < > = values in this interval not displayed.   Assessment: 53yo male with afib s/p cardioversion to SR on coumadin and heparin. INR is 1.4 on day 3 coumadin and heparin level is at goal (HL= 0.47)  Goal of Therapy:  Heparin level 0.3-0.7 units/ml   Plan:  -No heparin changes needed -Coumadin 10mg  po today -Daily PT/INR, CBC and heparin level  Harland German, Pharm D 07/12/2013 2:00 PM

## 2013-07-12 NOTE — Progress Notes (Signed)
ANTICOAGULATION CONSULT NOTE - Follow Up Consult  Pharmacy Consult for heparin Indication: atrial fibrillation  Labs:  Recent Labs  07/10/13 1631  07/10/13 1844 07/11/13 0030 07/11/13 0315 07/11/13 0823 07/11/13 1130 07/12/13 0240  HGB 11.1*  --   --   --  10.1*  --   --  9.6*  HCT 35.8*  --   --   --  32.8*  --   --  31.3*  PLT 143*  --   --   --  129*  --   --  129*  LABPROT  --   --   --   --  13.9  --   --   --   INR  --   --   --   --  1.09  --   --   --   HEPARINUNFRC  --   --   --   --  0.30  --  0.40 0.27*  CREATININE 14.31*  --   --   --  15.19*  --   --   --   TROPONINI  --   < > 0.31* <0.30  --  <0.30  --   --   < > = values in this interval not displayed.   Assessment: 52yo male now subtherapeutic on heparin after two levels at goal; has been in NSR since CV.  Goal of Therapy:  Heparin level 0.3-0.7 units/ml   Plan:  Will increase heparin gtt by 10% to 2300 units/hr and check level in 8hr.  Vernard Gambles, PharmD, BCPS  07/12/2013,3:50 AM

## 2013-07-12 NOTE — Progress Notes (Signed)
Throughout the night patient 02 sats dropped down into the 50's and 60's transient.  Oxgyen 2L's Shively placed on pt. O2 sats up to high 90's. Day shift RN notified and will report to MD.

## 2013-07-12 NOTE — Progress Notes (Signed)
S; patient on the floor at 1000 for HD  O; BP 114/69 , HR 77, RR 22, SpO2 93% on room air  A/P Renal failure; patient was in HD until approximately 1830. Spoke with RN prior to shift change. Since patient has been in HD all day was seen by Dr. [name] will see patient in the a.m.

## 2013-07-12 NOTE — Progress Notes (Signed)
Pt ate 90 % of breakfast tray and than ordered another one says he didn't like what he got on his tray. Pt ate 90 % of second tray before I could talk to him about his diet. Now c/o nausea given Zofran 4 mg IV . C/o anxiety given klonopin 0.5 mg po per pt request.

## 2013-07-12 NOTE — Progress Notes (Signed)
Patient Name: Gary Rivera Date of Encounter: 07/12/2013  Principal Problem:   Atrial flutter with rapid ventricular response Active Problems:   Chronic systolic congestive heart failure, NYHA class 1   HTN (hypertension)   Anemia   Pulmonary HTN- 50 mmHg 2011 ECHO   Mitral regurgitation (MODERATE)   Obesity (BMI 30-39.9)   Obesity hypoventilation syndrome   End stage renal disease-  on HD   Hypotension   NICM- recent EF "Nl" per pt- [facility]   Length of Stay: 2  SUBJECTIVE  Feels well. Successful maintenance of NSR overnight after cardioversion yesterday. Full review of echo shows LVEF is severely depressed - consider tachycardia related cardiomyopathy superimposed on previous (presumed nonischemic?) cardiomyopathy. We do not actually have documentation that his EF ever truly recovered in the past, just his assertion of the fact. Repeated overnight desaturation confirms clinical impression that he has OSA.  CURRENT MEDS . calcitRIOL  0.5 mcg Oral Daily  . darbepoetin (ARANESP) injection - DIALYSIS  25 mcg Intravenous Q Thu-HD  . lanthanum  2,000 mg Oral TID WC  . multivitamin  1 tablet Oral QHS  . pantoprazole  40 mg Oral Daily  . sodium chloride  3 mL Intravenous Q12H  . sodium chloride  3 mL Intravenous Q12H  . warfarin   Does not apply Once  . Warfarin - Pharmacist Dosing Inpatient   Does not apply q1800    OBJECTIVE   Intake/Output Summary (Last 24 hours) at 07/12/13 0816 Last data filed at 07/11/13 2143  Gross per 24 hour  Intake    898 ml  Output      0 ml  Net    898 ml   Filed Weights   07/10/13 2000 07/11/13 0333 07/12/13 0400  Weight: 303 lb 12.7 oz (137.8 kg) 302 lb 7.5 oz (137.2 kg) 309 lb 8.4 oz (140.4 kg)    PHYSICAL EXAM Filed Vitals:   07/11/13 2200 07/12/13 0000 07/12/13 0400 07/12/13 0807  BP: 109/65  112/65 114/69  Pulse: 80   77  Temp:  97.8 F (36.6 C) 97.8 F (36.6 C) 97.9 F (36.6 C)  TempSrc:  Oral Oral Oral  Resp: 27    22  Height:      Weight:   309 lb 8.4 oz (140.4 kg)   SpO2: 91%   93%   General: Alert, oriented x3, no distress, exam limited by obesity Head: no evidence of trauma, PERRL, EOMI, no exophtalmos or lid lag, no myxedema, no xanthelasma; normal ears, nose and oropharynx Neck: cannot see jugular venous pulsations and no hepatojugular reflux; brisk carotid pulses without delay and no carotid bruits Chest: clear to auscultation, no signs of consolidation by percussion or palpation, normal fremitus, symmetrical and full respiratory excursions Cardiovascular: normal position and quality of the apical impulse, regular rhythm, normal first and second heart sounds, no rubs or gallops, no murmur Abdomen: no tenderness or distention, no masses by palpation, no abnormal pulsatility or arterial bruits, normal bowel sounds, no hepatosplenomegaly Extremities: no clubbing, cyanosis or edema; 2+ radial, ulnar and brachial pulses bilaterally; 2+ right femoral, posterior tibial and dorsalis pedis pulses; 2+ left femoral, posterior tibial and dorsalis pedis pulses; no subclavian or femoral bruits Neurological: grossly nonfocal  LABS  CBC  Recent Labs  07/11/13 0315 07/12/13 0240  WBC 6.8 7.1  HGB 10.1* 9.6*  HCT 32.8* 31.3*  MCV 97.3 97.8  PLT 129* 129*   Basic Metabolic Panel  Recent Labs  07/10/13 1631 07/10/13  2200 07/11/13 0315  NA 148*  --  144  K 4.1  --  3.9  CL 101  --  99  CO2 24  --  23  GLUCOSE 95  --  89  BUN 65*  --  70*  CREATININE 14.31*  --  15.19*  CALCIUM 7.7*  --  7.4*  MG  --  1.8  --   PHOS  --  7.2*  --    Liver Function Tests No results found for this basename: AST, ALT, ALKPHOS, BILITOT, PROT, ALBUMIN,  in the last 72 hours No results found for this basename: LIPASE, AMYLASE,  in the last 72 hours Cardiac Enzymes  Recent Labs  07/10/13 1844 07/11/13 0030 07/11/13 0823  TROPONINI 0.31* <0.30 <0.30   Recent Labs  07/10/13 1846  TSH 1.490    Radiology  Studies Imaging results have been reviewed and Dg Chest Portable 1 View  07/10/2013   CLINICAL DATA:  Bronchitis, hypertension, tachycardia  EXAM: PORTABLE CHEST - 1 VIEW  COMPARISON:  05/08/2013  FINDINGS: Stable cardiomegaly with vascular congestion. No focal pneumonia, collapse or consolidation. No edema, effusion or pneumothorax. Trachea midline.  IMPRESSION: Stable cardiomegaly with vascular congestion. No superimposed acute process.   Electronically Signed   By: Ruel Favors M.D.   On: 07/10/2013 17:03    TELE NSR  ASSESSMENT AND PLAN  Stop diltiazem Avoid all negative inotropes. Cautiously add carvedilol. Bidil for CHF as outpatient if BP tolerates. OK for telemetry. IV heparin until INR>2. Enoxaparin and NOACs are not a good option due to ESRD. No interruption in anticoagulation for at least next 30 days - I recommend lifelong anticoagulation, barring severe bleeding complications. Strongly recommend sleep study and treatment for sleep related breathing disorder - he probably requires nocturnal BiPAP and daytime O2 as he seems to have both OSA and [place] sd.  He counters many of my recommendations with "I'm not going to worry about that right now" and "I'd like to discuss that later". Compliance may become a serious issue. Hopefully, he will follow up on these problems with his usual Cardiologist at [facility], Dr. Donita Brooks.    Thurmon Fair, MD, [facility] [phone] office [phone] pager 07/12/2013 8:16 AM

## 2013-07-12 NOTE — Progress Notes (Signed)
Pt does not like his diet. Talked in length with pt about his diet at home says he basically eats everything he wants because he does dialysis in his home 5 days a week . Pt had a long list of wants including diet change and some meds for anxiety, nausea and an inhaler. Placed call to Dr [name] and orders received.

## 2013-07-12 NOTE — Progress Notes (Signed)
Subjective: No complaints  Filed Vitals:   07/12/13 1200 07/12/13 1212 07/12/13 1230 07/12/13 1300  BP: 115/94 89/51 104/72 105/66  Pulse: 77 78 89 87  Temp:      TempSrc:      Resp: 18 26 23 24   Height:      Weight: 142.6 kg (314 lb 6 oz)     SpO2: 92% 95% 95% 96%   Exam: Falls asleep frequently, no distress, on HD No jvd Chest clear bilat RRR no MRG Abd obese, soft, NTND Trace bilat ankle edema Neuro is nf, ox3 L forearm AVF patent  Dialysis: NxStage 5d/wk - followed by [facility] Usual time ~3h   134kg   Heparin 5000   LFA AVF (buttonhole) Calcitriol 0.5 ug    EPO none   Venofer 200 mg q 4 wks Labs:  Hb 12, tsat 31%, phos 6.8, pth 603  Assessment: 1 Afib / RVR- cardioverted yest , in NSR now.  On IV hep and coumadin, INR 1.4 today 2 NICM- low EF per TEE 3 ESRD on HD 4 Anemia started low dose aranesp 5 MBD- cont fosrenol and vitD 6 Obesity / OSH/ OSA 7 HTN/volume- is 8kg over dry wt today on standing scale, max UF as tolerated  Plan- HD today, max UF.  HD tomorrow, same.     [name] MD  pager [phone]    cell [phone]  07/12/2013, 1:46 PM     Recent Labs Lab 07/10/13 1631 07/10/13 2200 07/11/13 0315  NA 148*  --  144  K 4.1  --  3.9  CL 101  --  99  CO2 24  --  23  GLUCOSE 95  --  89  BUN 65*  --  70*  CREATININE 14.31*  --  15.19*  CALCIUM 7.7*  --  7.4*  PHOS  --  7.2*  --    No results found for this basename: AST, ALT, ALKPHOS, BILITOT, PROT, ALBUMIN,  in the last 168 hours  Recent Labs Lab 07/10/13 1631 07/11/13 0315 07/12/13 0240  WBC 7.3 6.8 7.1  HGB 11.1* 10.1* 9.6*  HCT 35.8* 32.8* 31.3*  MCV 97.0 97.3 97.8  PLT 143* 129* 129*   . calcitRIOL  0.5 mcg Oral Daily  . carvedilol  3.125 mg Oral BID WC  . darbepoetin (ARANESP) injection - DIALYSIS  25 mcg Intravenous Q Thu-HD  . lanthanum  2,000 mg Oral TID WC  . multivitamin  1 tablet Oral QHS  . pantoprazole  40 mg Oral Daily  . sodium chloride  3 mL Intravenous Q12H  .  sodium chloride  3 mL Intravenous Q12H  . warfarin   Does not apply Once  . Warfarin - Pharmacist Dosing Inpatient   Does not apply q1800   . heparin 2,300 Units/hr (07/12/13 0547)   sodium chloride, albuterol, calcium carbonate, clonazePAM, menthol-cetylpyridinium, ondansetron (ZOFRAN) IV, oxyCODONE-acetaminophen, simethicone, sodium chloride

## 2013-07-13 DIAGNOSIS — R05 Cough: Secondary | ICD-10-CM

## 2013-07-13 DIAGNOSIS — I272 Pulmonary hypertension, unspecified: Secondary | ICD-10-CM | POA: Diagnosis present

## 2013-07-13 DIAGNOSIS — I959 Hypotension, unspecified: Secondary | ICD-10-CM

## 2013-07-13 DIAGNOSIS — R059 Cough, unspecified: Secondary | ICD-10-CM

## 2013-07-13 DIAGNOSIS — D649 Anemia, unspecified: Secondary | ICD-10-CM

## 2013-07-13 DIAGNOSIS — I059 Rheumatic mitral valve disease, unspecified: Secondary | ICD-10-CM

## 2013-07-13 LAB — CBC
HEMATOCRIT: 34.2 % — AB (ref 39.0–52.0)
HEMOGLOBIN: 10.4 g/dL — AB (ref 13.0–17.0)
MCH: 30 pg (ref 26.0–34.0)
MCHC: 30.4 g/dL (ref 30.0–36.0)
MCV: 98.6 fL (ref 78.0–100.0)
Platelets: 134 10*3/uL — ABNORMAL LOW (ref 150–400)
RBC: 3.47 MIL/uL — ABNORMAL LOW (ref 4.22–5.81)
RDW: 15.6 % — AB (ref 11.5–15.5)
WBC: 6.6 10*3/uL (ref 4.0–10.5)

## 2013-07-13 LAB — HEPARIN LEVEL (UNFRACTIONATED)
HEPARIN UNFRACTIONATED: 0.24 [IU]/mL — AB (ref 0.30–0.70)
Heparin Unfractionated: <0.1 IU/mL — ABNORMAL LOW (ref 0.30–0.70)
Heparin Unfractionated: 0.5 IU/mL (ref 0.30–0.70)

## 2013-07-13 LAB — PROTIME-INR
INR: 1.69 — AB (ref 0.00–1.49)
Prothrombin Time: 19.4 seconds — ABNORMAL HIGH (ref 11.6–15.2)

## 2013-07-13 MED ORDER — NEPRO/CARBSTEADY PO LIQD
237.0000 mL | ORAL | Status: DC | PRN
  Filled 2013-07-13: qty 237

## 2013-07-13 MED ORDER — LIDOCAINE HCL (PF) 1 % IJ SOLN
5.0000 mL | INTRAMUSCULAR | Status: DC | PRN
  Filled 2013-07-13: qty 5

## 2013-07-13 MED ORDER — OFF THE BEAT BOOK
Freq: Once | Status: AC
  Administered 2013-07-13: 19:00:00
  Filled 2013-07-13: qty 1

## 2013-07-13 MED ORDER — SODIUM CHLORIDE 0.9 % IV SOLN: 100.0000 mL | INTRAVENOUS | Status: DC | PRN

## 2013-07-13 MED ORDER — HEPARIN SODIUM (PORCINE) 1000 UNIT/ML DIALYSIS
5000.0000 [IU] | Freq: Once | INTRAMUSCULAR | Status: DC
  Filled 2013-07-13: qty 5

## 2013-07-13 MED ORDER — PENTAFLUOROPROP-TETRAFLUOROETH EX AERO: 1.0000 "application " | INHALATION_SPRAY | CUTANEOUS | Status: DC | PRN

## 2013-07-13 MED ORDER — ACTIVE PARTNERSHIP FOR HEALTH OF YOUR HEART BOOK
Freq: Once | Status: AC
  Administered 2013-07-13: 19:00:00
  Filled 2013-07-13: qty 1

## 2013-07-13 MED ORDER — HEPARIN SODIUM (PORCINE) 1000 UNIT/ML DIALYSIS
1000.0000 [IU] | INTRAMUSCULAR | Status: DC | PRN
  Filled 2013-07-13: qty 1

## 2013-07-13 MED ORDER — HEPARIN BOLUS VIA INFUSION
3300.0000 [IU] | Freq: Once | INTRAVENOUS | Status: AC
  Administered 2013-07-13: 3300 [IU] via INTRAVENOUS
  Filled 2013-07-13: qty 3300

## 2013-07-13 MED ORDER — LIDOCAINE-PRILOCAINE 2.5-2.5 % EX CREA: 1.0000 "application " | TOPICAL_CREAM | CUTANEOUS | Status: DC | PRN

## 2013-07-13 MED ORDER — COUMADIN BOOK
Freq: Once | Status: AC
  Administered 2013-07-13: 19:00:00
  Filled 2013-07-13: qty 1

## 2013-07-13 MED ORDER — WARFARIN SODIUM 10 MG PO TABS
10.0000 mg | ORAL_TABLET | Freq: Once | ORAL | Status: AC
  Administered 2013-07-13: 10 mg via ORAL
  Filled 2013-07-13: qty 1

## 2013-07-13 MED ORDER — SODIUM CHLORIDE 0.9 % IV BOLUS (SEPSIS): 250.0000 mL | Freq: Once | INTRAVENOUS | Status: DC

## 2013-07-13 MED ORDER — ACETAMINOPHEN 325 MG PO TABS
650.0000 mg | ORAL_TABLET | ORAL | Status: DC | PRN
  Administered 2013-07-13: 650 mg via ORAL
  Filled 2013-07-13: qty 2

## 2013-07-13 MED ORDER — ALTEPLASE 2 MG IJ SOLR
2.0000 mg | Freq: Once | INTRAMUSCULAR | Status: AC | PRN
  Filled 2013-07-13: qty 2

## 2013-07-13 NOTE — Progress Notes (Signed)
[place] TEAM 1 - Stepdown/ICU TEAM Progress Note  Gary Rivera:[id number] DOB: 02/28/1961 DOA: 07/10/2013 PCP: Quitman LivingsHASSAN,SAMI, MD  Admit HPI / Brief Narrative: 53 yo BM PMHx PHTN, Obesity Hypoventilation Syn, HTN, Hypotension, ESRD on Home HD, A-Fib/Fluter w/ RVR now on home HD -[facility] primary Nephrologist. Presented to the ER with fast heart rate noted with routine VS check during HD. Pt endorses has been ongoing for ~ 2-3 days- only occasionally was aware of palpitations and light headed sensation.  In the ER his VR was 130-140 with underlying atrial flutter. He had a low grade temperature. He was started on a Cardizem gtt in the ED. Cardiology was consulted and recommended IV Heparin for anti-coagulation and consideration for DCCV if atrial flutter with RVR persists.   HPI/Subjective: 4/25 patient concerned that he has had an increased temperature of 37.8C and requested to speak with a nephrologist prior to proceeding for HD today. Patient concerned that had a productive cough overnight (yellow sputum), however is not currently having productive cough. Nasal passages are congested per patient  Nephrologist felt after seeing patient that he could be discharged home safely to continue his home HD. However cardiology the patient remained until his INR> 2. Patient is safe for transfer to general. If INR not> in the a.m. will need to reconsult nephrology to arrange HD per patient.  Assessment/Plan: Atrial flutter with rapid ventricular response  -Cards following  -DCCV on 07/11/2013  -Warfarin per pharmacy discharge patient on 7.5 mg daily  -Patient MUST HAVE INR CHECKED IN 2 DAYS AT CARDIOLOGIST OFFICE post discharge; Dr Revonda HumphreyBharathi Upadhya (Cardiologist)   CKD (chronic kidney disease) stage V requiring chronic dialysis  -Nephro consulted  -pt sts usually pulls off ~ 2.5 kg with each tx.-dry wt 134 kg  -Followup w/ DO De HollingsheadLaura Miller (Nephrology)  Hypotension  -due to RVR nad possible  mild volume depletion, which stabilized after DCCV    Chronic systolic congestive heart failure, NYHA class 1  -compensated and treated with HD    HTN (hypertension)  -not on meds at home- primary control with HD -Cont. Coreg 3.125 mg BID    Pulmonary HTN- 50 mmHg 2011 ECHO/Obesity hypoventilation syndrome  -need to clarify if on CPAP at home  -Counseled patient he will need to establish care with pulmonologist recommend Dr. Odelia GageNorman E. Adair at [facility]; requires a sleep study   Anemia of CKD  -hgb has been variable ove the past 2 yrs- as low as 7-9 yet pt reports for at least 1 yr around 11-12  -current 10-11   Mitral regurgitation (MODERATE) -Upon discharge Management per Dr Revonda HumphreyBharathi Upadhya (Cardiologist)    Code Status: FULL Family Communication: no family present at time of exam Disposition Plan: Per cardiology after INR> 2    Consultants: Dr. Dietrich PatesPaula Ross (cardiology) Dr. Delano Metzobert Schertz (nephrology)   Procedure/Significant Events: Electrical Cardioversion 07/11/2013   TEE 07/11/2013   (TTE) Echocardiogram 07/11/2013  - Left ventricle: severely dilated. Wall thickness; mild LVH.  -LVEF= 25% to 30%. Diffuse hypokinesis. - Aortic valve: Mild regurgitation. - Mitral valve: Moderate regurgitation. - Left atrium: Moderately dilated. - Right ventricle: moderately dilated. - Right atrium mildly to moderately dilated. - Tricuspid valve: Moderate-severe regurgitation. - Pulmonary arteries: Systolic pressure was mildly increased. PA peak pressure: 41mm Hg (S).     Culture   Antibiotics:    DVT prophylaxis: Heparin and Coumadin   Devices    LINES / TUBES:      Continuous Infusions: .  heparin 2,300 Units/hr (07/13/13 0700)    Objective: VITAL SIGNS: Temp: 99.9 F (37.7 C) (04/25 0426) Temp src: Oral (04/25 0426) BP: 115/63 mmHg (04/25 0426) Pulse Rate: 89 (04/25 0426) SPO2; FIO2:   Intake/Output Summary (Last 24 hours) at 07/13/13  0740 Last data filed at 07/13/13 0700  Gross per 24 hour  Intake   1822 ml  Output   3005 ml  Net  -1183 ml     Exam: General: A./O. x4, No acute respiratory distress  HEENT; mild nasal congestion, negative erythema in throat., Negative lymphadenopathy  Lungs: Clear to auscultation bilaterally without wheezes or crackles  Cardiovascular: Regular rate and rhythm without murmur gallop or rub normal S1 and S2  Abdomen: Nontender, nondistended, soft, bowel sounds positive, no rebound, no ascites, no appreciable mass  Extremities: No significant cyanosis, clubbing, or edema bilateral lower extremities   Data Reviewed: Basic Metabolic Panel:  Recent Labs Lab 07/10/13 1631 07/10/13 2200 07/11/13 0315 07/12/13 1252  NA 148*  --  144 143  K 4.1  --  3.9 5.4*  CL 101  --  99 100  CO2 24  --  23 19  GLUCOSE 95  --  89 110*  BUN 65*  --  70* 79*  CREATININE 14.31*  --  15.19* 18.17*  CALCIUM 7.7*  --  7.4* 6.8*  MG  --  1.8  --   --   PHOS  --  7.2*  --  9.9*   Liver Function Tests:  Recent Labs Lab 07/12/13 1252  ALBUMIN 3.3*   No results found for this basename: LIPASE, AMYLASE,  in the last 168 hours No results found for this basename: AMMONIA,  in the last 168 hours CBC:  Recent Labs Lab 07/10/13 1631 07/11/13 0315 07/12/13 0240 07/12/13 1247 07/13/13 0435  WBC 7.3 6.8 7.1 7.6 6.6  HGB 11.1* 10.1* 9.6* 9.4* 10.4*  HCT 35.8* 32.8* 31.3* 30.1* 34.2*  MCV 97.0 97.3 97.8 97.4 98.6  PLT 143* 129* 129* 127* 134*   Cardiac Enzymes:  Recent Labs Lab 07/10/13 1637 07/10/13 1844 07/11/13 0030 07/11/13 0823  TROPONINI 0.32* 0.31* <0.30 <0.30   BNP (last 3 results)  Recent Labs  04/29/13 0437  PROBNP 3309.0*   CBG: No results found for this basename: GLUCAP,  in the last 168 hours  Recent Results (from the past 240 hour(s))  MRSA PCR SCREENING     Status: None   Collection Time    07/10/13  8:13 PM      Result Value Ref Range Status   MRSA by PCR  NEGATIVE  NEGATIVE Final   Comment:            The GeneXpert MRSA Assay (FDA     approved for NASAL specimens     only), is one component of a     comprehensive MRSA colonization     surveillance program. It is not     intended to diagnose MRSA     infection nor to guide or     monitor treatment for     MRSA infections.     Studies:  Recent x-ray studies have been reviewed in detail by the Attending Physician  Scheduled Meds:  Scheduled Meds: . calcitRIOL  0.5 mcg Oral Daily  . carvedilol  3.125 mg Oral BID WC  . darbepoetin (ARANESP) injection - DIALYSIS  25 mcg Intravenous Q Thu-HD  . [START ON 07/14/2013] heparin  5,000 Units Dialysis Once in dialysis  . lanthanum  2,000 mg Oral TID WC  . multivitamin  1 tablet Oral QHS  . pantoprazole  40 mg Oral Daily  . sodium chloride  250 mL Intravenous Once  . sodium chloride  3 mL Intravenous Q12H  . sodium chloride  3 mL Intravenous Q12H  . warfarin   Does not apply Once  . Warfarin - Pharmacist Dosing Inpatient   Does not apply q1800    Time spent on care of this patient: 40 mins   Drema Dallas , MD   [facility] Office  [phone] Pager [phone]  On-Call/Text Page:      [email or web address]      password [email or web address]  If 7PM-7AM, please contact night-coverage [email or web address] Password [facility] 07/13/2013, 7:40 AM   LOS: 3 days

## 2013-07-13 NOTE — Progress Notes (Signed)
Subjective:  Feels okay, denies shortness of breath or chest pain.  Objective:  Vital Signs in the last 24 hours: BP 112/72  Pulse 89  Temp(Src) 100.1 F (37.8 C) (Oral)  Resp 27  Ht 6\' 2"  (1.88 m)  Wt 134.7 kg (296 lb 15.4 oz)  BMI 38.11 kg/m2  SpO2 97%  Physical Exam: Obese black male in no acute distress Lungs:  Clear  Cardiac:  Regular rhythm, normal S1 and S2, no S3 Extremities:  No edema present  Intake/Output from previous day: 04/24 0701 - 04/25 0700 In: 1822 [P.O.:1280; I.V.:542] Out: 3005  Weight Filed Weights   07/12/13 0400 07/12/13 1200 07/12/13 1552  Weight: 140.4 kg (309 lb 8.4 oz) 142.6 kg (314 lb 6 oz) 134.7 kg (296 lb 15.4 oz)    Lab Results: Basic Metabolic Panel:  Recent Labs  08/65/78 0315 07/12/13 1252  NA 144 143  K 3.9 5.4*  CL 99 100  CO2 23 19  GLUCOSE 89 110*  BUN 70* 79*  CREATININE 15.19* 18.17*    CBC:  Recent Labs  07/12/13 1247 07/13/13 0435  WBC 7.6 6.6  HGB 9.4* 10.4*  HCT 30.1* 34.2*  MCV 97.4 98.6  PLT 127* 134*    BNP    Component Value Date/Time   PROBNP 3309.0* 04/29/2013 0437    PROTIME: Lab Results  Component Value Date   INR 1.69* 07/13/2013   INR 1.40 07/12/2013   INR 1.09 07/11/2013    Telemetry: Sinus rhythm  Assessment/Plan: 1. Atrial flutter currently maintaining sinus rhythm 2. End-stage renal disease 3. Chronic systolic heart failure with severely depressed ejection fraction 4. Pulmonary hypertension 5. Sleep apnea and obesity hypoventilation syndrome  Recommendations:  Continue heparin until INR is greater than 2. Will add low-dose carvedilol today. Okay to go to floor.     Darden Palmer  MD [facility]  07/13/2013, 11:27 AM

## 2013-07-13 NOTE — Progress Notes (Signed)
Subjective: low grade temps  Filed Vitals:   07/13/13 0426 07/13/13 0730 07/13/13 0800 07/13/13 0801  BP: 115/63  112/72 112/72  Pulse: 89   89  Temp: 99.9 F (37.7 C) 100.1 F (37.8 C)    TempSrc: Oral Oral    Resp: 16 30 27 27   Height:      Weight:      SpO2:    97%   Exam: Falls asleep frequently, no distress, on HD No jvd Chest clear bilat RRR no MRG Abd obese, soft, NTND Trace bilat ankle edema Neuro is nf, ox3 L forearm AVF patent  Dialysis: NxStage 5d/wk - followed by [facility] Usual time ~3h   134kg   Heparin 5000   LFA AVF (buttonhole) Calcitriol 0.5 ug    EPO none   Venofer 200 mg q 4 wks Labs:  Hb 12, tsat 31%, phos 6.8, pth 603  Assessment: 1 Afib / RVR- cardioverted yest , in NSR now.  On IV hep and coumadin, INR 1.69 today       2 NICM- low EF per TEE 3 ESRD on HD 4 Anemia started low dose aranesp 5 MBD- cont fosrenol and vitD 6 Obesity / OSH/ OSA 7 HTN/volume- at dry wt 8 Low grade fever- has URI symptoms, recent CXR neg, he can follow this at home  Plan- OK for d/c today, he will resume HD at home, have d/w primary    [name] MD  pager [phone]    cell [phone]  07/13/2013, 9:17 AM     Recent Labs Lab 07/10/13 1631 07/10/13 2200 07/11/13 0315 07/12/13 1252  NA 148*  --  144 143  K 4.1  --  3.9 5.4*  CL 101  --  99 100  CO2 24  --  23 19  GLUCOSE 95  --  89 110*  BUN 65*  --  70* 79*  CREATININE 14.31*  --  15.19* 18.17*  CALCIUM 7.7*  --  7.4* 6.8*  PHOS  --  7.2*  --  9.9*    Recent Labs Lab 07/12/13 1252  ALBUMIN 3.3*    Recent Labs Lab 07/12/13 0240 07/12/13 1247 07/13/13 0435  WBC 7.1 7.6 6.6  HGB 9.6* 9.4* 10.4*  HCT 31.3* 30.1* 34.2*  MCV 97.8 97.4 98.6  PLT 129* 127* 134*   . calcitRIOL  0.5 mcg Oral Daily  . carvedilol  3.125 mg Oral BID WC  . darbepoetin (ARANESP) injection - DIALYSIS  25 mcg Intravenous Q Thu-HD  . [START ON 07/14/2013] heparin  5,000 Units Dialysis Once in dialysis  . lanthanum   2,000 mg Oral TID WC  . multivitamin  1 tablet Oral QHS  . pantoprazole  40 mg Oral Daily  . sodium chloride  3 mL Intravenous Q12H  . warfarin   Does not apply Once  . Warfarin - Pharmacist Dosing Inpatient   Does not apply q1800   . heparin 2,300 Units/hr (07/13/13 0700)   sodium chloride, sodium chloride, sodium chloride, albuterol, alteplase, calcium carbonate, clonazePAM, feeding supplement (NEPRO CARB STEADY), heparin, lidocaine (PF), lidocaine-prilocaine, menthol-cetylpyridinium, ondansetron (ZOFRAN) IV, oxyCODONE-acetaminophen, pentafluoroprop-tetrafluoroeth, simethicone, sodium chloride

## 2013-07-13 NOTE — Progress Notes (Signed)
ANTICOAGULATION CONSULT NOTE - Follow Up Consult  Pharmacy Consult for Heparin Indication: atrial fibrillation  Allergies  Allergen Reactions  . Renvela [Sevelamer] Nausea And Vomiting    Patient Measurements: Height: 6\' 2"  (188 cm) Weight: 296 lb 15.4 oz (134.7 kg) IBW/kg (Calculated) : 82.2 Heparin Dosing Weight: 112 kg  Vital Signs: Temp: 98.5 F (36.9 C) (04/25 2049) Temp src: Oral (04/25 2049) BP: 103/62 mmHg (04/25 2049) Pulse Rate: 87 (04/25 2049)  Labs:  Recent Labs  07/11/13 0030  07/11/13 0315 07/11/13 0823  07/12/13 0240 07/12/13 1200 07/12/13 1247 07/12/13 1252 07/13/13 0435 07/13/13 1305 07/13/13 2030  HGB  --   < > 10.1*  --   --  9.6*  --  9.4*  --  10.4*  --   --   HCT  --   < > 32.8*  --   --  31.3*  --  30.1*  --  34.2*  --   --   PLT  --   < > 129*  --   --  129*  --  127*  --  134*  --   --   LABPROT  --   --  13.9  --   --   --  16.8*  --   --  19.4*  --   --   INR  --   --  1.09  --   --   --  1.40  --   --  1.69*  --   --   HEPARINUNFRC  --   --  0.30  --   < > 0.27* 0.47  --   --  0.24* <0.10* 0.50  CREATININE  --   --  15.19*  --   --   --   --   --  18.17*  --   --   --   TROPONINI <0.30  --   --  <0.30  --   --   --   --   --   --   --   --   < > = values in this interval not displayed.  Estimated Creatinine Clearance: 6.9 ml/min (by C-G formula based on Cr of 18.17).   Medications:  Scheduled:  . calcitRIOL  0.5 mcg Oral Daily  . carvedilol  3.125 mg Oral BID WC  . darbepoetin (ARANESP) injection - DIALYSIS  25 mcg Intravenous Q Thu-HD  . [START ON 07/14/2013] heparin  5,000 Units Dialysis Once in dialysis  . lanthanum  2,000 mg Oral TID WC  . multivitamin  1 tablet Oral QHS  . pantoprazole  40 mg Oral Daily  . sodium chloride  3 mL Intravenous Q12H  . warfarin   Does not apply Once  . Warfarin - Pharmacist Dosing Inpatient   Does not apply q1800   Infusions:  . heparin 2,650 Units/hr (07/13/13 1447)    Assessment: 53 yo M  on heparin > coumadin for afib.  Patient is therapeutic on heparin at 2650 units/hr.  Will continue current rate and follow-up with AM labs.   Goal of Therapy:  Heparin level 0.3-0.7 units/ml Monitor platelets by anticoagulation protocol: Yes   Plan:  Continue heparin at 2650 units/hr. Heparin level and CBC daily with AM labs.  [employer], Pharm.D., BCPS Clinical Pharmacist Pager [phone] 07/13/2013 10:02 PM

## 2013-07-13 NOTE — Progress Notes (Signed)
ANTICOAGULATION CONSULT NOTE - Follow Up Consult  Pharmacy Consult for heparin Indication: atrial fibrillation  Labs:  Recent Labs  07/10/13 1631  07/10/13 1844 07/11/13 0030 07/11/13 0315 07/11/13 0823  07/12/13 0240 07/12/13 1200 07/12/13 1247 07/12/13 1252 07/13/13 0435  HGB 11.1*  --   --   --  10.1*  --   --  9.6*  --  9.4*  --  10.4*  HCT 35.8*  --   --   --  32.8*  --   --  31.3*  --  30.1*  --  34.2*  PLT 143*  --   --   --  129*  --   --  129*  --  127*  --  134*  LABPROT  --   --   --   --  13.9  --   --   --  16.8*  --   --  19.4*  INR  --   --   --   --  1.09  --   --   --  1.40  --   --  1.69*  HEPARINUNFRC  --   --   --   --  0.30  --   < > 0.27* 0.47  --   --  0.24*  CREATININE 14.31*  --   --   --  15.19*  --   --   --   --   --  18.17*  --   TROPONINI  --   < > 0.31* <0.30  --  <0.30  --   --   --   --   --   --   < > = values in this interval not displayed.   Assessment/Plan: 53yo male now subtherapeutic on heparin after one level at goal; per RN ~1hr prior to labs IV line was "half out" and site had been bleeding, took some time to stop bleeding but now resolved w/ IV running fine, pt cannot say how long IV was not fully in; will not change for now and recheck level.   Vernard Gambles, PharmD, BCPS  07/13/2013,5:47 AM

## 2013-07-13 NOTE — Progress Notes (Signed)
ANTICOAGULATION CONSULT NOTE - Follow Up Consult  Pharmacy Consult for heparin/coumadin Indication: atrial fibrillation  Allergies  Allergen Reactions  . Renvela [Sevelamer] Nausea And Vomiting    Patient Measurements: Height: 6\' 2"  (188 cm) Weight: 296 lb 15.4 oz (134.7 kg) IBW/kg (Calculated) : 82.2 Heparin Dosing Weight: 112 kg  Vital Signs: Temp: 100 F (37.8 C) (04/25 1200) Temp src: Oral (04/25 1200) BP: 132/67 mmHg (04/25 1200) Pulse Rate: 89 (04/25 0801)  Labs:  Recent Labs  07/10/13 1631  07/10/13 1844 07/11/13 0030 07/11/13 0315 07/11/13 0823  07/12/13 0240 07/12/13 1200 07/12/13 1247 07/12/13 1252 07/13/13 0435 07/13/13 1305  HGB 11.1*  --   --   --  10.1*  --   --  9.6*  --  9.4*  --  10.4*  --   HCT 35.8*  --   --   --  32.8*  --   --  31.3*  --  30.1*  --  34.2*  --   PLT 143*  --   --   --  129*  --   --  129*  --  127*  --  134*  --   LABPROT  --   --   --   --  13.9  --   --   --  16.8*  --   --  19.4*  --   INR  --   --   --   --  1.09  --   --   --  1.40  --   --  1.69*  --   HEPARINUNFRC  --   --   --   --  0.30  --   < > 0.27* 0.47  --   --  0.24* <0.10*  CREATININE 14.31*  --   --   --  15.19*  --   --   --   --   --  18.17*  --   --   TROPONINI  --   < > 0.31* <0.30  --  <0.30  --   --   --   --   --   --   --   < > = values in this interval not displayed.  Estimated Creatinine Clearance: 6.9 ml/min (by C-G formula based on Cr of 18.17).   Medications:  Scheduled:  . calcitRIOL  0.5 mcg Oral Daily  . carvedilol  3.125 mg Oral BID WC  . darbepoetin (ARANESP) injection - DIALYSIS  25 mcg Intravenous Q Thu-HD  . [START ON 07/14/2013] heparin  5,000 Units Dialysis Once in dialysis  . lanthanum  2,000 mg Oral TID WC  . multivitamin  1 tablet Oral QHS  . pantoprazole  40 mg Oral Daily  . sodium chloride  3 mL Intravenous Q12H  . warfarin   Does not apply Once  . Warfarin - Pharmacist Dosing Inpatient   Does not apply q1800    Assessment: 53yo male with afib s/p cardioversion to SR on coumadin and heparin bridge.   Heparin level is undetectable this afternoon. Per nurse, no issues with administration.   INR is subtherapeutic through trending up at 1.69 today. 1.4 on day 4 coumadin and heparin level is at goal (HL= 0.47)  Patient weighs 134.7 kg (IBW 82.2 kg) so he will likely require higher infusion rates of heparin.  Goal of Therapy:  Heparin level 0.3-0.7 units/ml   Plan:  -Give heparin bolus 3300 units/hr x 1 now -Increase heparin drip to 2650 units/hr -  Check heparin level in 6 hours (2030) -Coumadin 10mg  po today -Daily PT/INR, CBC and heparin level

## 2013-07-14 LAB — PROTIME-INR
INR: 2.04 — ABNORMAL HIGH (ref 0.00–1.49)
Prothrombin Time: 22.4 seconds — ABNORMAL HIGH (ref 11.6–15.2)

## 2013-07-14 LAB — CBC
HEMATOCRIT: 31.1 % — AB (ref 39.0–52.0)
Hemoglobin: 9.4 g/dL — ABNORMAL LOW (ref 13.0–17.0)
MCH: 29.9 pg (ref 26.0–34.0)
MCHC: 30.2 g/dL (ref 30.0–36.0)
MCV: 99 fL (ref 78.0–100.0)
Platelets: 120 10*3/uL — ABNORMAL LOW (ref 150–400)
RBC: 3.14 MIL/uL — ABNORMAL LOW (ref 4.22–5.81)
RDW: 15.7 % — ABNORMAL HIGH (ref 11.5–15.5)
WBC: 5.9 10*3/uL (ref 4.0–10.5)

## 2013-07-14 LAB — HEPARIN LEVEL (UNFRACTIONATED): Heparin Unfractionated: 0.36 IU/mL (ref 0.30–0.70)

## 2013-07-14 MED ORDER — WARFARIN SODIUM 4 MG PO TABS
8.0000 mg | ORAL_TABLET | Freq: Once | ORAL | Status: AC
  Administered 2013-07-14: 8 mg via ORAL
  Filled 2013-07-14: qty 2

## 2013-07-14 MED ORDER — CEFUROXIME AXETIL 250 MG PO TABS
250.0000 mg | ORAL_TABLET | Freq: Every day | ORAL | Status: DC
  Administered 2013-07-14 – 2013-07-15 (×2): 250 mg via ORAL
  Filled 2013-07-14 (×3): qty 1

## 2013-07-14 NOTE — Progress Notes (Signed)
Subjective: No complaints  Filed Vitals:   07/13/13 1540 07/13/13 2049 07/14/13 0548 07/14/13 0844  BP: 101/46 103/62 101/53 111/65  Pulse: 90 87 79 73  Temp: 100 F (37.8 C) 98.5 F (36.9 C) 99.6 F (37.6 C)   TempSrc: Oral Oral Oral   Resp: 22 18 18    Height: 6\' 2"  (1.88 m)     Weight:      SpO2: 96% 94% 91%    Exam: Falls asleep frequently, no distress, on HD No jvd Chest clear bilat RRR no MRG Abd obese, soft, NTND Trace bilat ankle edema Neuro is nf, ox3 L forearm AVF patent  Dialysis: NxStage 5d/wk - followed by [facility] Usual time ~3h   134kg   Heparin 5000   LFA AVF (buttonhole) Calcitriol 0.5 ug    EPO none   Venofer 200 mg q 4 wks Labs:  Hb 12, tsat 31%, phos 6.8, pth 603  Assessment: 1 Afib / RVR- cardioverted , in NSR now.  On IV hep and coumadin, INR 2.01   2 NICM- low EF per TEE 3 ESRD on HD 4 Anemia started low dose aranesp 5 MBD- cont fosrenol and vitD 6 Obesity / OSH/ OSA 7 HTN/volume- at dry wt 8 Low grade fever- per primary  Plan- to be d/c'd in am Monday if INR remains acceptable according to pt/notes. No need for HD today.     [name] MD  pager [phone]    cell [phone]  07/14/2013, 11:03 AM     Recent Labs Lab 07/10/13 1631 07/10/13 2200 07/11/13 0315 07/12/13 1252  NA 148*  --  144 143  K 4.1  --  3.9 5.4*  CL 101  --  99 100  CO2 24  --  23 19  GLUCOSE 95  --  89 110*  BUN 65*  --  70* 79*  CREATININE 14.31*  --  15.19* 18.17*  CALCIUM 7.7*  --  7.4* 6.8*  PHOS  --  7.2*  --  9.9*    Recent Labs Lab 07/12/13 1252  ALBUMIN 3.3*    Recent Labs Lab 07/12/13 1247 07/13/13 0435 07/14/13 0345  WBC 7.6 6.6 5.9  HGB 9.4* 10.4* 9.4*  HCT 30.1* 34.2* 31.1*  MCV 97.4 98.6 99.0  PLT 127* 134* 120*   . calcitRIOL  0.5 mcg Oral Daily  . carvedilol  3.125 mg Oral BID WC  . cefUROXime  250 mg Oral Q supper  . darbepoetin (ARANESP) injection - DIALYSIS  25 mcg Intravenous Q Thu-HD  . heparin  5,000 Units Dialysis  Once in dialysis  . lanthanum  2,000 mg Oral TID WC  . multivitamin  1 tablet Oral QHS  . pantoprazole  40 mg Oral Daily  . sodium chloride  3 mL Intravenous Q12H  . warfarin  8 mg Oral ONCE-1800  . warfarin   Does not apply Once  . Warfarin - Pharmacist Dosing Inpatient   Does not apply q1800   . heparin 2,650 Units/hr (07/14/13 1051)   sodium chloride, sodium chloride, sodium chloride, acetaminophen, albuterol, calcium carbonate, clonazePAM, feeding supplement (NEPRO CARB STEADY), heparin, lidocaine (PF), lidocaine-prilocaine, menthol-cetylpyridinium, ondansetron (ZOFRAN) IV, oxyCODONE-acetaminophen, pentafluoroprop-tetrafluoroeth, simethicone, sodium chloride

## 2013-07-14 NOTE — Progress Notes (Signed)
Patient ID: Gary Rivera  male  UEA:[id number]    DOB: October 19, 1960    DOA: 07/10/2013  PCP: Quitman LivingsHASSAN,SAMI, MD  Assessment/Plan: Active Problems:   Chronic systolic congestive heart failure, NYHA class 1   HTN (hypertension)   Anemia   Pulmonary HTN- 50 mmHg 2011 ECHO   Mitral regurgitation (MODERATE)   Obesity (BMI 30-39.9)   Obesity hypoventilation syndrome   Atrial flutter with RVR   End stage renal disease-  on HD   Hypotension   NICM- recent EF "Nl" per pt- [facility]   Pulmonary hypertension  Atrial flutter with rapid ventricular response  -Cards following  -DCCV on 07/11/2013  -Warfarin per pharmacy discharge patient on 7.5 mg daily, discussed with Dr. Donnie Ahoilley, recommended continue heparin for today for 24-hour bridge, DC if INR remains above 2 tomorrow, then patient will continue Coumadin after that  -Followup with Dr Revonda HumphreyBharathi Upadhya (Cardiologist) after DC  CKD (chronic kidney disease) stage V requiring chronic dialysis  -Nephro consulted,pt sts usually pulls off ~ 2.5 kg with each tx.-dry wt 134 kg  -Followup w/ DO De HollingsheadLaura Miller (Nephrology)   Hypotension  -due to RVR nad possible mild volume depletion, which stabilized after DCCV   Acute sinusitis with low-grade fevers/URI symptoms - Started on Ceftin for 7 days  Chronic systolic congestive heart failure, NYHA class 1 , history of moderate mitral regurgitation -compensated and treated with HD  - Follow with Dr Revonda HumphreyBharathi Upadhya (Cardiologist) outpatient  HTN (hypertension)  -not on meds at home- primary control with HD  -Cont. Coreg 3.125 mg BID   Pulmonary HTN- 50 mmHg 2011 ECHO/Obesity hypoventilation syndrome  -need to clarify if on CPAP at home  -he will need to establish care with pulmonologist recommend Dr. Odelia GageNorman E. Adair at [facility]; requires a sleep study   Anemia of CKD  -hgb has been variable ove the past 2 yrs- as low as 7-9 yet pt reports for at least 1 yr around 11-12    DVT  Prophylaxis:  Code Status:  Family Communication:  Disposition: Hopefully DC in a.m. if INR remains above 2. Ambulated well with physical therapy today  Consultants:  Cardiology  Nephrology  Procedures: Electrical Cardioversion 07/11/2013  TEE 07/11/2013  (TTE) Echocardiogram 07/11/2013   Antibiotics:  Ceftin 4/26    Subjective: seen and examined, complaining of URI symptoms, had low-grade temp yesterday and this morning  Objective: Weight change:  No intake or output data in the 24 hours ending 07/14/13 1007 Blood pressure 111/65, pulse 73, temperature 99.6 F (37.6 C), temperature source Oral, resp. rate 18, height 6\' 2"  (1.88 m), weight 134.7 kg (296 lb 15.4 oz), SpO2 91.00%.  Physical Exam: General: Alert and awake, oriented x3, not in any acute distress. CVS: S1-S2 clear, no murmur rubs or gallops Chest: clear to auscultation bilaterally, no wheezing, rales or rhonchi Abdomen: soft nontender, nondistended, normal bowel sounds  Extremities: no cyanosis, clubbing or edema noted bilaterally Neuro: Cranial nerves II-XII intact, no focal neurological deficits  Lab Results: Basic Metabolic Panel:  Recent Labs Lab 07/10/13 2200 07/11/13 0315 07/12/13 1252  NA  --  144 143  K  --  3.9 5.4*  CL  --  99 100  CO2  --  23 19  GLUCOSE  --  89 110*  BUN  --  70* 79*  CREATININE  --  15.19* 18.17*  CALCIUM  --  7.4* 6.8*  MG 1.8  --   --   PHOS 7.2*  --  9.9*   Liver Function Tests:  Recent Labs Lab 07/12/13 1252  ALBUMIN 3.3*   No results found for this basename: LIPASE, AMYLASE,  in the last 168 hours No results found for this basename: AMMONIA,  in the last 168 hours CBC:  Recent Labs Lab 07/13/13 0435 07/14/13 0345  WBC 6.6 5.9  HGB 10.4* 9.4*  HCT 34.2* 31.1*  MCV 98.6 99.0  PLT 134* 120*   Cardiac Enzymes:  Recent Labs Lab 07/10/13 1844 07/11/13 0030 07/11/13 0823  TROPONINI 0.31* <0.30 <0.30   BNP: No components found with this  basename: POCBNP,  CBG: No results found for this basename: GLUCAP,  in the last 168 hours   Micro Results: Recent Results (from the past 240 hour(s))  MRSA PCR SCREENING     Status: None   Collection Time    07/10/13  8:13 PM      Result Value Ref Range Status   MRSA by PCR NEGATIVE  NEGATIVE Final   Comment:            The GeneXpert MRSA Assay (FDA     approved for NASAL specimens     only), is one component of a     comprehensive MRSA colonization     surveillance program. It is not     intended to diagnose MRSA     infection nor to guide or     monitor treatment for     MRSA infections.    Studies/Results: Dg Chest Portable 1 View  07/10/2013   CLINICAL DATA:  Bronchitis, hypertension, tachycardia  EXAM: PORTABLE CHEST - 1 VIEW  COMPARISON:  05/08/2013  FINDINGS: Stable cardiomegaly with vascular congestion. No focal pneumonia, collapse or consolidation. No edema, effusion or pneumothorax. Trachea midline.  IMPRESSION: Stable cardiomegaly with vascular congestion. No superimposed acute process.   Electronically Signed   By: Ruel Favors M.D.   On: 07/10/2013 17:03    Medications: Scheduled Meds: . calcitRIOL  0.5 mcg Oral Daily  . carvedilol  3.125 mg Oral BID WC  . cefUROXime  250 mg Oral Q supper  . darbepoetin (ARANESP) injection - DIALYSIS  25 mcg Intravenous Q Thu-HD  . heparin  5,000 Units Dialysis Once in dialysis  . lanthanum  2,000 mg Oral TID WC  . multivitamin  1 tablet Oral QHS  . pantoprazole  40 mg Oral Daily  . sodium chloride  3 mL Intravenous Q12H  . warfarin  8 mg Oral ONCE-1800  . warfarin   Does not apply Once  . Warfarin - Pharmacist Dosing Inpatient   Does not apply q1800      LOS: 4 days   Rynlee Lisbon Jenna Luo M.D. [facility] 07/14/2013, 10:07 AM Pager: [phone]  If 7PM-7AM, please contact night-coverage [email or web address] Password [email or web address]  **Disclaimer: This note was dictated with voice recognition software. Similar sounding words can  inadvertently be transcribed and this note may contain transcription errors which may not have been corrected upon publication of note.**

## 2013-07-14 NOTE — Progress Notes (Signed)
Subjective:  Feels okay, denies shortness of breath or chest pain. INR is 2 today.  Objective:  Vital Signs in the last 24 hours: BP 111/65  Pulse 73  Temp(Src) 99.6 F (37.6 C) (Oral)  Resp 18  Ht 6\' 2"  (1.88 m)  Wt 134.7 kg (296 lb 15.4 oz)  BMI 38.11 kg/m2  SpO2 91%  Physical Exam: Obese black male in no acute distress Lungs:  Clear  Cardiac:  Regular rhythm, normal S1 and S2, no S3 Extremities:  No edema present  Intake/Output from previous day:   Weight Filed Weights   07/12/13 0400 07/12/13 1200 07/12/13 1552  Weight: 140.4 kg (309 lb 8.4 oz) 142.6 kg (314 lb 6 oz) 134.7 kg (296 lb 15.4 oz)    Lab Results: Basic Metabolic Panel:  Recent Labs  76/80/88 1252  NA 143  K 5.4*  CL 100  CO2 19  GLUCOSE 110*  BUN 79*  CREATININE 18.17*    CBC:  Recent Labs  07/13/13 0435 07/14/13 0345  WBC 6.6 5.9  HGB 10.4* 9.4*  HCT 34.2* 31.1*  MCV 98.6 99.0  PLT 134* 120*    BNP    Component Value Date/Time   PROBNP 3309.0* 04/29/2013 0437    PROTIME: Lab Results  Component Value Date   INR 2.04* 07/14/2013   INR 1.69* 07/13/2013   INR 1.40 07/12/2013    Telemetry: Sinus rhythm  Assessment/Plan: 1. Atrial flutter currently maintaining sinus rhythm 2. End-stage renal disease 3. Chronic systolic heart failure with severely depressed ejection fraction 4. Pulmonary hypertension 5. Sleep apnea and obesity hypoventilation syndrome  Recommendations:  Allow 24 hours overlap of heparin to be sure INR remains above 2 since he has never seen warfarin before. He could go home in the morning if his INR remains above 2 allowing for an overlap.   Darden Palmer  MD [facility]  07/14/2013, 8:50 AM

## 2013-07-14 NOTE — Progress Notes (Signed)
Pt has refused coreg x both doses today. Pt c/o feeling shaky encouraged to not lay in the bed for extended periods of time to prevent weakness and wasting.  Walked in hallway with PT early AM without any difficulty. Pt very concerned about missing HD and reports he must be discharged in time tomorrow to get home to do HD. Pt cont. To report he has "chills and shakes" T-Max 100 WBC WNL. Pt has received 1st dose po Ceftin

## 2013-07-14 NOTE — Evaluation (Signed)
Physical Therapy Evaluation Patient Details Name: Gary Rivera MRN: [id number] DOB: 1961-02-20 Today's Date: 07/14/2013   History of Present Illness  53 y/o male with CKD, and NICM. Admitted with rapid HR, noted to be in A flutter.   Clinical Impression  Pt adm due to the above. Pt ambulating at mod I at this time. Challenged with high level balance activities; no balance disturbances noted. Pt encouraged to ambulate as tolerated around unit to improve overall deconditioning. Will sign off at this time.    Follow Up Recommendations No PT follow up    Equipment Recommendations  None recommended by PT    Recommendations for Other Services       Precautions / Restrictions Precautions Precautions: None Restrictions Weight Bearing Restrictions: No      Mobility  Bed Mobility Overal bed mobility: Independent                Transfers Overall transfer level: Independent Equipment used: None             General transfer comment: no LOB or sway noted  Ambulation/Gait Ambulation/Gait assistance: Modified independent (Device/Increase time) Ambulation Distance (Feet): 200 Feet Assistive device: None Gait Pattern/deviations: WFL(Within Functional Limits) Gait velocity: decreased; pt c/o feeling "weak"  Gait velocity interpretation: Below normal speed for age/gender General Gait Details: pt reports he is ambulating slower due to generalized weakness; no LOB noted; no AD needed at this time   Stairs Stairs: Yes Stairs assistance: Supervision Stair Management: No rails;Step to pattern;Forwards Number of Stairs: 2 General stair comments: pt slightly unsteady with steps; cues for safety; pt then reported he has ramp and does not have to ambulate steps to enter house   Wheelchair Mobility    Modified Rankin (Stroke Patients Only)       Balance Overall balance assessment: Independent                           High level balance activites: Head  turns;Direction changes;Turns High Level Balance Comments: no LOB noted; pt able to pick up objects off ground without difficulty or changes in balance             Pertinent Vitals/Pain No c/o pain. Tremors due to fever, per pt; MD and RN aware     Home Living Family/patient expects to be discharged to:: Private residence Living Arrangements: Alone Available Help at Discharge: Personal care attendant;Family;Available 24 hours/day Type of Home: House Home Access: Ramped entrance (initially reported STE)     Home Layout: One level Home Equipment: None      Prior Function Level of Independence: Independent         Comments: pt needs assistance with dialysis; has RN 7 days a week for 2 hours and sister 3 hours a day x 4 days; both help with cleaning; pt independent with ADLs      Hand Dominance        Extremity/Trunk Assessment   Upper Extremity Assessment: Overall WFL for tasks assessed           Lower Extremity Assessment: Overall WFL for tasks assessed      Cervical / Trunk Assessment: Normal  Communication   Communication: No difficulties  Cognition Arousal/Alertness: Awake/alert Behavior During Therapy: WFL for tasks assessed/performed Overall Cognitive Status: Within Functional Limits for tasks assessed                      General Comments  Exercises        Assessment/Plan    PT Assessment Patent does not need any further PT services  PT Diagnosis     PT Problem List    PT Treatment Interventions     PT Goals (Current goals can be found in the Care Plan section) Acute Rehab PT Goals Patient Stated Goal: home today PT Goal Formulation: No goals set, d/c therapy    Frequency     Barriers to discharge        Co-evaluation               End of Session Equipment Utilized During Treatment: Gait belt Activity Tolerance: Patient tolerated treatment well Patient left: in bed;with call bell/phone within reach;Other  (comment) (sitting EOB) Nurse Communication: Mobility status         Time: 8657-8469 PT Time Calculation (min): 16 min   Charges:   PT Evaluation $Initial PT Evaluation Tier I: 1 Procedure PT Treatments $Gait Training: 8-22 mins   PT G CodesNadara Mustard [place], [place] [phone] 07/14/2013, 8:26 AM

## 2013-07-14 NOTE — Progress Notes (Addendum)
ANTICOAGULATION CONSULT NOTE - Follow Up Consult  Pharmacy Consult for heparin/coumadin Indication: atrial fibrillation  Allergies  Allergen Reactions  . Renvela [Sevelamer] Nausea And Vomiting    Patient Measurements: Height: 6\' 2"  (188 cm) Weight: 296 lb 15.4 oz (134.7 kg) IBW/kg (Calculated) : 82.2 Heparin Dosing Weight: 112 kg  Vital Signs: Temp: 99.6 F (37.6 C) (04/26 0548) Temp src: Oral (04/26 0548) BP: 101/53 mmHg (04/26 0548) Pulse Rate: 79 (04/26 0548)  Labs:  Recent Labs  07/12/13 1200 07/12/13 1247 07/12/13 1252 07/13/13 0435 07/13/13 1305 07/13/13 2030 07/14/13 0345  HGB  --  9.4*  --  10.4*  --   --  9.4*  HCT  --  30.1*  --  34.2*  --   --  31.1*  PLT  --  127*  --  134*  --   --  120*  LABPROT 16.8*  --   --  19.4*  --   --  22.4*  INR 1.40  --   --  1.69*  --   --  2.04*  HEPARINUNFRC 0.47  --   --  0.24* <0.10* 0.50 0.36  CREATININE  --   --  18.17*  --   --   --   --     Estimated Creatinine Clearance: 6.9 ml/min (by C-G formula based on Cr of 18.17).   Medications:  Scheduled:  . calcitRIOL  0.5 mcg Oral Daily  . carvedilol  3.125 mg Oral BID WC  . darbepoetin (ARANESP) injection - DIALYSIS  25 mcg Intravenous Q Thu-HD  . heparin  5,000 Units Dialysis Once in dialysis  . lanthanum  2,000 mg Oral TID WC  . multivitamin  1 tablet Oral QHS  . pantoprazole  40 mg Oral Daily  . sodium chloride  3 mL Intravenous Q12H  . warfarin   Does not apply Once  . Warfarin - Pharmacist Dosing Inpatient   Does not apply q1800   Assessment: 53yo male with afib s/p cardioversion to SR on coumadin and heparin bridge.   INR is therapeutic today 2.04.  Heparin level remains therapeutic as well at 0.36  Goal of Therapy:  Heparin level 0.3-0.7 units/ml INR 2-3   Plan:  -Cont  heparin drip at 2650 units/hr -Coumadin 8mg  po today -Daily PT/INR, CBC and heparin level  Thanks for allowing pharmacy to be a part of this patient's care.  Talbert Cage,  PharmD Clinical Pharmacist, [phone]  Addum:  MD wished to start ceftin for 7 days for UTI.  Will give Ceftin 250 mg po daily for 7 days.

## 2013-07-15 DIAGNOSIS — G253 Myoclonus: Secondary | ICD-10-CM | POA: Diagnosis present

## 2013-07-15 LAB — BASIC METABOLIC PANEL
BUN: 83 mg/dL — ABNORMAL HIGH (ref 6–23)
CO2: 20 mEq/L (ref 19–32)
CREATININE: 19.21 mg/dL — AB (ref 0.50–1.35)
Calcium: 7.5 mg/dL — ABNORMAL LOW (ref 8.4–10.5)
Chloride: 95 mEq/L — ABNORMAL LOW (ref 96–112)
GFR calc non Af Amer: 2 mL/min — ABNORMAL LOW (ref >90)
GFR, EST AFRICAN AMERICAN: 3 mL/min — AB (ref >90)
Glucose, Bld: 88 mg/dL (ref 70–99)
Potassium: 5.7 mEq/L — ABNORMAL HIGH (ref 3.7–5.3)
Sodium: 139 mEq/L (ref 137–147)

## 2013-07-15 LAB — CBC
HCT: 29.5 % — ABNORMAL LOW (ref 39.0–52.0)
HEMOGLOBIN: 9.2 g/dL — AB (ref 13.0–17.0)
MCH: 30 pg (ref 26.0–34.0)
MCHC: 31.2 g/dL (ref 30.0–36.0)
MCV: 96.1 fL (ref 78.0–100.0)
Platelets: 102 10*3/uL — ABNORMAL LOW (ref 150–400)
RBC: 3.07 MIL/uL — ABNORMAL LOW (ref 4.22–5.81)
RDW: 15.4 % (ref 11.5–15.5)
WBC: 6.2 10*3/uL (ref 4.0–10.5)

## 2013-07-15 LAB — PHOSPHORUS: Phosphorus: 9.5 mg/dL — ABNORMAL HIGH (ref 2.3–4.6)

## 2013-07-15 LAB — HEPARIN LEVEL (UNFRACTIONATED): HEPARIN UNFRACTIONATED: 0.47 [IU]/mL (ref 0.30–0.70)

## 2013-07-15 LAB — PROTIME-INR
INR: 3.39 — ABNORMAL HIGH (ref 0.00–1.49)
PROTHROMBIN TIME: 33 s — AB (ref 11.6–15.2)

## 2013-07-15 MED ORDER — SODIUM CHLORIDE 0.9 % IV SOLN: 100.0000 mL | INTRAVENOUS | Status: DC | PRN

## 2013-07-15 MED ORDER — HEPARIN SODIUM (PORCINE) 1000 UNIT/ML DIALYSIS: 1000.0000 [IU] | INTRAMUSCULAR | Status: DC | PRN

## 2013-07-15 MED ORDER — NEPRO/CARBSTEADY PO LIQD: 237.0000 mL | ORAL | Status: DC | PRN

## 2013-07-15 MED ORDER — WARFARIN SODIUM 1 MG PO TABS
1.0000 mg | ORAL_TABLET | Freq: Once | ORAL | Status: AC
  Administered 2013-07-15: 1 mg via ORAL
  Filled 2013-07-15: qty 1

## 2013-07-15 MED ORDER — LIDOCAINE-PRILOCAINE 2.5-2.5 % EX CREA: 1.0000 "application " | TOPICAL_CREAM | CUTANEOUS | Status: DC | PRN

## 2013-07-15 MED ORDER — DARBEPOETIN ALFA-POLYSORBATE 100 MCG/0.5ML IJ SOLN: 100.0000 ug | INTRAMUSCULAR | Status: DC

## 2013-07-15 MED ORDER — HEPARIN SODIUM (PORCINE) 1000 UNIT/ML DIALYSIS
100.0000 [IU]/kg | INTRAMUSCULAR | Status: DC | PRN
  Administered 2013-07-15: 13500 [IU] via INTRAVENOUS_CENTRAL

## 2013-07-15 MED ORDER — ALTEPLASE 2 MG IJ SOLR: 2.0000 mg | Freq: Once | INTRAMUSCULAR | Status: DC | PRN

## 2013-07-15 MED ORDER — PENTAFLUOROPROP-TETRAFLUOROETH EX AERO: 1.0000 "application " | INHALATION_SPRAY | CUTANEOUS | Status: DC | PRN

## 2013-07-15 MED ORDER — LIDOCAINE HCL (PF) 1 % IJ SOLN: 5.0000 mL | INTRAMUSCULAR | Status: DC | PRN

## 2013-07-15 NOTE — Progress Notes (Signed)
UR Completed Tomoko Sandra Graves-Bigelow, RN,BSN [phone]  

## 2013-07-15 NOTE — Discharge Instructions (Addendum)
Information on my medicine - Coumadin   (Warfarin)  This medication education was reviewed with me or my healthcare representative as part of my discharge preparation.   Why was Coumadin prescribed for you? Coumadin was prescribed for you because you have a blood clot or a medical condition that can cause an increased risk of forming blood clots. Blood clots can cause serious health problems by blocking the flow of blood to the heart, lung, or brain. Coumadin can prevent harmful blood clots from forming. As a reminder your indication for Coumadin is:   Stroke Prevention Because Of Atrial Fibrillation  What test will check on my response to Coumadin? While on Coumadin (warfarin) you will need to have an INR test regularly to ensure that your dose is keeping you in the desired range. The INR (international normalized ratio) number is calculated from the result of the laboratory test called prothrombin time (PT).  If an INR APPOINTMENT HAS NOT ALREADY BEEN MADE FOR YOU please schedule an appointment to have this lab work done by your health care provider within 7 days. Your INR goal is usually a number between:  2 to 3 or your provider may give you a more narrow range like 2-2.5.  Ask your health care provider during an office visit what your goal INR is.  What  do you need to  know  About  COUMADIN? Take Coumadin (warfarin) exactly as prescribed by your healthcare provider about the same time each day.  DO NOT stop taking without talking to the doctor who prescribed the medication.  Stopping without other blood clot prevention medication to take the place of Coumadin may increase your risk of developing a new clot or stroke.  Get refills before you run out.  What do you do if you miss a dose? If you miss a dose, take it as soon as you remember on the same day then continue your regularly scheduled regimen the next day.  Do not take two doses of Coumadin at the same time.  Important Safety  Information A possible side effect of Coumadin (Warfarin) is an increased risk of bleeding. You should call your healthcare provider right away if you experience any of the following:   Bleeding from an injury or your nose that does not stop.   Unusual colored urine (red or dark brown) or unusual colored stools (red or black).   Unusual bruising for unknown reasons.   A serious fall or if you hit your head (even if there is no bleeding).  Some foods or medicines interact with Coumadin (warfarin) and might alter your response to warfarin. To help avoid this:   Eat a balanced diet, maintaining a consistent amount of Vitamin K.   Notify your provider about major diet changes you plan to make.   Avoid alcohol or limit your intake to 1 drink for women and 2 drinks for men per day. (1 drink is 5 oz. wine, 12 oz. beer, or 1.5 oz. liquor.)  Make sure that ANY health care provider who prescribes medication for you knows that you are taking Coumadin (warfarin).  Also make sure the healthcare provider who is monitoring your Coumadin knows when you have started a new medication including herbals and non-prescription products.  Coumadin (Warfarin)  Major Drug Interactions  Increased Warfarin Effect Decreased Warfarin Effect  Alcohol (large quantities) Antibiotics (esp. Septra/Bactrim, Flagyl, Cipro) Amiodarone (Cordarone) Aspirin (ASA) Cimetidine (Tagamet) Megestrol (Megace) NSAIDs (ibuprofen, naproxen, etc.) Piroxicam (Feldene) Propafenone (Rythmol SR) Propranolol (Inderal)  Isoniazid (INH) Posaconazole (Noxafil) Barbiturates (Phenobarbital) Carbamazepine (Tegretol) Chlordiazepoxide (Librium) Cholestyramine (Questran) Griseofulvin Oral Contraceptives Rifampin Sucralfate (Carafate) Vitamin K   Coumadin (Warfarin) Major Herbal Interactions  Increased Warfarin Effect Decreased Warfarin Effect  Garlic Ginseng Ginkgo biloba Coenzyme Q10 Green tea St. Johns wort    Coumadin (Warfarin)  FOOD Interactions  Eat a consistent number of servings per week of foods HIGH in Vitamin K (1 serving =  cup)  Collards (cooked, or boiled & drained) Kale (cooked, or boiled & drained) Mustard greens (cooked, or boiled & drained) Parsley *serving size only =  cup Spinach (cooked, or boiled & drained) Swiss chard (cooked, or boiled & drained) Turnip greens (cooked, or boiled & drained)  Eat a consistent number of servings per week of foods MEDIUM-HIGH in Vitamin K (1 serving = 1 cup)  Asparagus (cooked, or boiled & drained) Broccoli (cooked, boiled & drained, or raw & chopped) Brussel sprouts (cooked, or boiled & drained) *serving size only =  cup Lettuce, raw (green leaf, endive, romaine) Spinach, raw Turnip greens, raw & chopped   These websites have more information on Coumadin (warfarin):  http://www.king-russell.com/www.coumadin.com; https://www.hines.net/www.ahrq.gov/consumer/coumadin.htm;   Atrial Fibrillation Atrial fibrillation is a condition that causes your heart to beat irregularly. It may also cause your heart to beat faster than normal. Atrial fibrillation can prevent your heart from pumping blood normally. It increases your risk of stroke and heart problems. HOME CARE  Take medications as told by your doctor.  Only take medications that your doctor says are safe. Some medications can make the condition worse or happen again.  If blood thinners were prescribed by your doctor, take them exactly as told. Too much can cause bleeding. Too little and you will not have the needed protection against stroke and other problems.  Perform blood tests at home if told by your doctor.  Perform blood tests exactly as told by your doctor.  Do not drink alcohol.  Do not drink beverages with caffeine such as coffee, soda, and some teas.  Maintain a healthy weight.  Do not use diet pills unless your doctor says they are safe. They may make heart problems worse.  Follow diet instructions as told by your doctor.  Exercise  regularly as told by your doctor.  Keep all follow-up appointments. GET HELP RIGHT AWAY IF:   You have chest or belly (abdominal) pain.  You feel sick to your stomach (nauseous)  You suddenly have swollen feet and ankles.  You feel dizzy.  You face, arms, or legs feel numb or weak.  There is a change in your vision or speech.  You notice a change in the speed, rhythm, or strength of your heartbeat.  You suddenly begin peeing (urinating) more often.  You get tired more easily when moving or exercising. MAKE SURE YOU:   Understand these instructions.  Will watch your condition.  Will get help right away if you are not doing well or get worse. Document Released: 12/15/2007 Document Revised: 07/02/2012 Document Reviewed: 04/17/2012 [facility] Patient Information 2014 [place], [place].   Dialysis Diet A dialysis diet is a special diet when you start peritoneal dialysis or hemodialysis. Foods must be chosen carefully because different foods produce different wastes in your blood. If you are on dialysis treatment, that means your kidneys have stopped working properly on their own. This means the kidneys are removing very little or no wastes from your blood. Dialysis can perform the function of a healthy kidney and filter wastes from your body.  However, between dialysis sessions, wastes build up in your blood and can make you sick. You can reduce the amount of wastes that build up in your blood by watching what you eat and drink. A good meal plan can improve your dialysis and your health. Your dietitian or renal dietitian can help you plan meals.  FLUIDS In between dialysis sessions, your kidneys may be able to remove some fluid or none at all. Fluid can build up and cause swelling and weight gain. The extra fluid affects your blood pressure and can make your heart work harder. Overloading your body with fluid could lead to serious heart trouble. Every person on dialysis has a different  amount of fluid that they can have each day. Talk to your dietitian about how much fluid you can have each day and write that down.  Foods that add to your fluid intake include:  Foods that contain liquid at room temperature, such as soup, gelatin dessert, and ice cream.  Fruits and vegetables, such as melons, grapes, apples, oranges, tomatoes, lettuce, and celery. Your dietitian will be able to give you other tips for managing your thirst. POTASSIUM Potassium is a mineral found in many foods, especially milk, fruits, and vegetables. It affects how steadily your heart beats. Potassium levels can rise between dialysis sessions and can affect your heartbeat and may even cause death.  Avoid foods high in potassium. If you do eat high-potassium foods, eat smaller portions. For example, eat half a pear instead of a whole pear. Eat only very small portions of oranges and melons. You can remove some of the potassium from potatoes and other vegetables by peeling them and then soaking them in a large amount of water for several hours. Drain and rinse them before cooking. Talk to a dietitian about foods you can eat instead of high-potassium foods. High-potassium foods and drinks include:  Apricots.  Brussels sprouts.  Dates.  Lima beans.  Oranges.  Prune juice.  Spinach.  Avocados.  Milk.  Figs.  Melons.  Peanuts.  Prunes.  Tomatoes.  Bananas.  Cantaloupe.  Kiwi fruit.  Nectarines.  Asparagus spears.  Raisins.  Winter squash.  Beets.  Clams.  Orange juice.  Potatoes.  Sardines.  Yogurt. PHOSPHORUS Phosphorus is a mineral found in many foods. If you have too much phosphorus in your blood, your bones lose calcium. Losing calcium will make your bones weak and more likely to break. Also, too much phosphorus may make your skin itch. Food high in phosphorus include milk, cheese, dried beans, peas, colas, nuts, and peanut butter. Avoid eating too much of these foods.  Usually, people on dialysis are limited to  cup of milk per day. Talk to a dietitian about foods you can eat instead of high-phosphorus foods. PROTEIN You may be encouraged to eat as much "high-quality protein" as you can. High-quality proteins come from meat, fish, poultry, and eggs. Choose low-fat (lean) meats that are also low in phosphorus. If you are a vegetarian, ask your dietitian about other ways to get your protein. Low-fat milk is a good source of protein, but milk is high in phosphorus and potassium and adds to your fluid intake. Talk to a dietitian to see if milk fits into your food plan. SODIUM Sodium makes you thirsty, and if you are on dialysis, you must restrict how much fluid you drink. Therefore, try to eat fresh foods that are naturally low in sodium. Stay away from canned foods and frozen dinners. Look for products  labeled "low sodium." Do not use salt substitutes because they contain potassium. Talk to your dietitian about foods and spice blends without sodium or potassium.  CALORIES Calories come from food and provide energy for your body. Your dietitian may recommend adding or cutting down on the calories you eat depending on if you need to lose or gain weight. Talk to a dietitian about foods you can eat to either gain or lose weight. VITAMINS AND MINERALS Your caregiver may prescribe a vitamin and mineral supplement. Vitamins and minerals may be missing from your diet because you have to avoid so many foods. Talk to your dietitian or kidney doctor before choosing a supplement. Many vitamin or mineral supplements sold on the store shelf may be harmful to you. Document Released: 12/03/2003 Document Revised: 09/06/2011 Document Reviewed: 05/17/2011 [facility] Patient Information 2014 [place], [place].   HH- RN and Home 02 arranged with [facility] - [phone]

## 2013-07-15 NOTE — Care Management Note (Addendum)
  Page 2 of 2   07/16/2013     11:27:47 AM CARE MANAGEMENT NOTE 07/16/2013  Patient:  Gary Rivera, Gary Rivera   Account Number:  [id number]  Date Initiated:  07/15/2013  Documentation initiated by:  GRAVES-BIGELOW,BRENDA  Subjective/Objective Assessment:   Pt admitted for afib with RVR- S/p cardioversion in NSR. Pt had Urgent HD 07-15-13.     Action/Plan:   CM will continue to monitor for disposition needs.   Anticipated DC Date:  07/16/2013   Anticipated DC Plan:  HOME W HOME HEALTH SERVICES      DC Planning Services  CM consult      [facility] Choice  HOME HEALTH  DURABLE MEDICAL EQUIPMENT   Choice offered to / List presented to:  C-1 Patient   DME arranged  [facility]      DME agency  [facility]     [facility] arranged  HH-1 RN      [facility] agency  [facility]   Status of service:  Completed, signed off Medicare Important Message given?   (If response is "NO", the following Medicare IM given date fields will be blank) Date Medicare IM given:   Date Additional Medicare IM given:    Discharge Disposition:  HOME W HOME HEALTH SERVICES  Per UR Regulation:  Reviewed for med. necessity/level of care/duration of stay  If discussed at Long Length of Stay Meetings, dates discussed:    Comments:  07/16/13- 0930- Donn Pierini RN, BSN [phone] Pt to d/c home today, will need home 02 has CHF hx and per RN dropped into 80s off 02- await RN to place note into chart. MD has placed DME order- also will need HH-RN- will need INR check tomorrow and fax to cardiologist office- spoke with pt at bedside- who is agreeable to [facility] and home 02- states that he has used [facility] in past and agrees to use of [facility] again. call made to [facility] with [facility] for home 02 needs- will deliver 02 to room prior to discharge- referral for HH-RN called to Lupita Leash with [facility]- services to begin 07/17/13- pt awaiting ride home.

## 2013-07-15 NOTE — Progress Notes (Signed)
ANTICOAGULATION CONSULT NOTE - Follow Up Consult  Pharmacy Consult for heparin/coumadin Indication: atrial fibrillation  Allergies  Allergen Reactions  . Renvela [Sevelamer] Nausea And Vomiting    Patient Measurements: Height: 6\' 2"  (188 cm) Weight: 296 lb 15.4 oz (134.7 kg) IBW/kg (Calculated) : 82.2 Heparin Dosing Weight: 112 kg  Vital Signs: Temp: 98.9 F (37.2 C) (04/27 0526) Temp src: Oral (04/27 0526) BP: 104/49 mmHg (04/27 0526) Pulse Rate: 72 (04/27 0526)  Labs:  Recent Labs  07/12/13 1252 07/13/13 0435  07/13/13 2030 07/14/13 0345 07/15/13 0511  HGB  --  10.4*  --   --  9.4* 9.2*  HCT  --  34.2*  --   --  31.1* 29.5*  PLT  --  134*  --   --  120* 102*  LABPROT  --  19.4*  --   --  22.4* 33.0*  INR  --  1.69*  --   --  2.04* 3.39*  HEPARINUNFRC  --  0.24*  < > 0.50 0.36 0.47  CREATININE 18.17*  --   --   --   --   --   < > = values in this interval not displayed.  Estimated Creatinine Clearance: 6.9 ml/min (by C-G formula based on Cr of 18.17).  Assessment: 53yo male with aflutter s/p cardioversion to SR on coumadin and heparin bridge.   INR has jumped to 3.39 today after 4 doses of coumadin 10 mg and 1 dose of coumadin 8 mg yesterday. Large 10.3 second jump in protime. Heparin level remains therapeutic at 0.47.  MD has stopped heparin this morning.  No bleeding noted.  Day # 2/7 of ceftin for URI. AF, WBC wnl.   Goal of Therapy: INR 2-3   Plan:  -DC heparin drip -coumadin 1 mg po x 1 dose today -Daily PT/INR,  Stop daily CBC and heparin level  Thanks for allowing pharmacy to be a part of this patient's care. Herby Abraham, Pharm.D. [phone] 07/15/2013 10:24 AM

## 2013-07-15 NOTE — Progress Notes (Signed)
Patient Name: Gary Rivera Date of Encounter: 07/15/2013   Principal Problem:   Atrial flutter with RVR Active Problems:   End stage renal disease-  on HD   Chronic systolic congestive heart failure, NYHA class 1   NICM- recent EF "Nl" per pt- followed at [facility]   HTN (hypertension)   Anemia   Mitral regurgitation (MODERATE)   Obesity (BMI 30-39.9)   Obesity hypoventilation syndrome   Hypotension   Pulmonary hypertension   SUBJECTIVE  No chest pain, sob, palpitations.  Maintaining sinus.  Has "the shakes" this morning - involuntary twitching.  For dialysis today.  Says he won't go home until tomorrow.  CURRENT MEDS . calcitRIOL  0.5 mcg Oral Daily  . carvedilol  3.125 mg Oral BID WC  . cefUROXime  250 mg Oral Q supper  . [START ON 07/18/2013] darbepoetin (ARANESP) injection - DIALYSIS  100 mcg Intravenous Q Thu-HD  . heparin  5,000 Units Dialysis Once in dialysis  . lanthanum  2,000 mg Oral TID WC  . multivitamin  1 tablet Oral QHS  . pantoprazole  40 mg Oral Daily  . warfarin   Does not apply Once  . Warfarin - Pharmacist Dosing Inpatient   Does not apply q1800    OBJECTIVE  Filed Vitals:   07/14/13 0844 07/14/13 1400 07/14/13 2100 07/15/13 0526  BP: 111/65 107/66 110/55 104/49  Pulse: 73 79 75 72  Temp:  98.3 F (36.8 C) 98.1 F (36.7 C) 98.9 F (37.2 C)  TempSrc:  Oral  Oral  Resp:  18 18 18   Height:      Weight:      SpO2:  89% 92% 96%   No intake or output data in the 24 hours ending 07/15/13 0749 Filed Weights   07/12/13 0400 07/12/13 1200 07/12/13 1552  Weight: 309 lb 8.4 oz (140.4 kg) 314 lb 6 oz (142.6 kg) 296 lb 15.4 oz (134.7 kg)    PHYSICAL EXAM  General: Pleasant, NAD.  Involuntary twitching noted. Neuro: Alert and oriented X 3. Moves all extremities spontaneously. Psych: Normal affect. HEENT:  Normal  Neck: Supple without bruits.  Obese, difficult to assess jvp. Lungs:  Resp regular and unlabored, CTA. Heart: RRR no s3, s4, or  murmurs. Abdomen: Soft, non-tender, non-distended, BS + x 4.  Extremities: No clubbing, cyanosis or edema. DP/PT/Radials 2+ and equal bilaterally.  Accessory Clinical Findings  CBC  Recent Labs  07/14/13 0345 07/15/13 0511  WBC 5.9 6.2  HGB 9.4* 9.2*  HCT 31.1* 29.5*  MCV 99.0 96.1  PLT 120* 102*   Basic Metabolic Panel  Recent Labs  07/12/13 1252  NA 143  K 5.4*  CL 100  CO2 19  GLUCOSE 110*  BUN 79*  CREATININE 18.17*  CALCIUM 6.8*  PHOS 9.9*   Liver Function Tests  Recent Labs  07/12/13 1252  ALBUMIN 3.3*   Lab Results  Component Value Date   INR 3.39* 07/15/2013   INR 2.04* 07/14/2013   INR 1.69* 07/13/2013    TELE  rsr  Radiology/Studies  Dg Chest Portable 1 View  07/10/2013   CLINICAL DATA:  Bronchitis, hypertension, tachycardia  EXAM: PORTABLE CHEST - 1 VIEW  COMPARISON:  05/08/2013  FINDINGS: Stable cardiomegaly with vascular congestion. No focal pneumonia, collapse or consolidation. No edema, effusion or pneumothorax. Trachea midline.  IMPRESSION: Stable cardiomegaly with vascular congestion. No superimposed acute process.   Electronically Signed   By: Ruel Favorsrevor  Shick M.D.   On: 07/10/2013 17:03  ASSESSMENT AND PLAN  1.  Atrial flutter with RVR:  S/p TEE/DCCV on 4/23.  Maintaining sinus rhythm.  INR 3.39.  He has been refusing coreg.  He is willing to continue taking coumadin for 4 wks but would like to talk with his regular cardiologist @ [facility] before committing to more than that.  He will have labs drawn during dialysis sessions.  2.  Chronic systolic CHF/Presumed NICM:  Volume mgmt per nephrology/HD.  Wt has been stable.  Refusing BB.  He is not currently on acei/arb - defer mgmt to outpt cardiologist.  3.  ESRD:  HD today per nephrology.  Twitching - ? Uremia.  Labs @ dialysis.  4.  OSA: outpt sleep eval advised.  Signed, Ok Anis NP

## 2013-07-15 NOTE — Progress Notes (Signed)
Patient ID: Gary Rivera  male  VOJ:[id number]    DOB: 15-Jan-1961    DOA: 07/10/2013  PCP: [name], MD  Assessment/Plan: Active Problems:   Chronic systolic congestive heart failure, NYHA class 1   HTN (hypertension)   Anemia   Pulmonary HTN- 50 mmHg 2011 ECHO   Mitral regurgitation (MODERATE)   Obesity (BMI 30-39.9)   Obesity hypoventilation syndrome   Atrial flutter with RVR   End stage renal disease-  on HD   Hypotension   NICM- recent EF "Nl" per pt- followed at [facility]   Pulmonary hypertension  Atrial flutter with rapid ventricular response : now in NSR  -Cards following, recommended followup with her primary cardiologist at [facility] this week after discharge to further manage the worsening EF. Patient is not on ARB and refuses beta blocker. He did not take Coreg yesterday -DCCV on 07/11/2013  -Warfarin per pharmacy, dc heparin gtt  -Followup with Dr Revonda Humphrey (Cardiologist) after DC  CKD (chronic kidney disease) stage V requiring chronic dialysis, ? Having uremia symptoms with myoclonus - Nephro consulted, discussed in detail with Dr. Darrick Penna, will have urgent dialysis today - Followup w/ DO De Hollingshead (Nephrology)   Hypotension  - due to RVR which stabilized after DCCV   Acute sinusitis with low-grade fevers/URI symptoms - Started on Ceftin for 7 days  Chronic systolic congestive heart failure, NYHA class 1 , history of moderate mitral regurgitation -compensated and treated with HD  - Follow with Dr Revonda Humphrey (Cardiologist) outpatient  HTN (hypertension)  -not on meds at home- primary control with HD  -Cont. Coreg 3.125 mg BID   Pulmonary HTN- 50 mmHg 2011 ECHO/Obesity hypoventilation syndrome, obstructive sleep apnea  - Patient will need home O2 evaluation at discharge, should be done after hemodialysis today -he will need to establish care with pulmonologist recommend Dr. Odelia Gage. [facility]; requires a sleep study   Anemia of  CKD  -hgb has been variable over the past 2 yrs- as low as 7-9 yet pt reports for at least 1 yr around 11-12   DVT Prophylaxis:  Code Status:  Family Communication:  Disposition:   Consultants:  Cardiology  Nephrology  Procedures: Electrical Cardioversion 07/11/2013  TEE 07/11/2013  (TTE) Echocardiogram 07/11/2013   Antibiotics:  Ceftin 4/26    Subjective: seen and examined, having tremulousness and myoclonus, states hemodialysis last was day before yesterday. Otherwise no fevers, chills or any URI symptoms  Objective: Weight change:   Intake/Output Summary (Last 24 hours) at 07/15/13 1049 Last data filed at 07/15/13 0900  Gross per 24 hour  Intake    240 ml  Output      0 ml  Net    240 ml   Blood pressure 104/49, pulse 72, temperature 98.9 F (37.2 C), temperature source Oral, resp. rate 18, height 6\' 2"  (1.88 m), weight 134.7 kg (296 lb 15.4 oz), SpO2 96.00%.  Physical Exam: General: Alert and awake, oriented x3, tremulous, twitching in the upper body CVS: S1-S2 clear, holosystolic murmur 2/6 Chest: Bibasilar crackles Abdomen: soft nontender, nondistended, normal bowel sounds  Extremities: no cyanosis, clubbing or edema noted bilaterally Neuro: Cranial nerves II-XII intact, no focal neurological deficits  Lab Results: Basic Metabolic Panel:  Recent Labs Lab 07/10/13 2200 07/11/13 0315 07/12/13 1252  NA  --  144 143  K  --  3.9 5.4*  CL  --  99 100  CO2  --  23 19  GLUCOSE  --  89  110*  BUN  --  70* 79*  CREATININE  --  15.19* 18.17*  CALCIUM  --  7.4* 6.8*  MG 1.8  --   --   PHOS 7.2*  --  9.9*   Liver Function Tests:  Recent Labs Lab 07/12/13 1252  ALBUMIN 3.3*   No results found for this basename: LIPASE, AMYLASE,  in the last 168 hours No results found for this basename: AMMONIA,  in the last 168 hours CBC:  Recent Labs Lab 07/14/13 0345 07/15/13 0511  WBC 5.9 6.2  HGB 9.4* 9.2*  HCT 31.1* 29.5*  MCV 99.0 96.1  PLT 120* 102*    Cardiac Enzymes:  Recent Labs Lab 07/10/13 1844 07/11/13 0030 07/11/13 0823  TROPONINI 0.31* <0.30 <0.30   BNP: No components found with this basename: POCBNP,  CBG: No results found for this basename: GLUCAP,  in the last 168 hours   Micro Results: Recent Results (from the past 240 hour(s))  MRSA PCR SCREENING     Status: None   Collection Time    07/10/13  8:13 PM      Result Value Ref Range Status   MRSA by PCR NEGATIVE  NEGATIVE Final   Comment:            The GeneXpert MRSA Assay (FDA     approved for NASAL specimens     only), is one component of a     comprehensive MRSA colonization     surveillance program. It is not     intended to diagnose MRSA     infection nor to guide or     monitor treatment for     MRSA infections.    Studies/Results: Dg Chest Portable 1 View  07/10/2013   CLINICAL DATA:  Bronchitis, hypertension, tachycardia  EXAM: PORTABLE CHEST - 1 VIEW  COMPARISON:  05/08/2013  FINDINGS: Stable cardiomegaly with vascular congestion. No focal pneumonia, collapse or consolidation. No edema, effusion or pneumothorax. Trachea midline.  IMPRESSION: Stable cardiomegaly with vascular congestion. No superimposed acute process.   Electronically Signed   By: Ruel Favorsrevor  Shick M.D.   On: 07/10/2013 17:03    Medications: Scheduled Meds: . calcitRIOL  0.5 mcg Oral Daily  . carvedilol  3.125 mg Oral BID WC  . cefUROXime  250 mg Oral Q supper  . [START ON 07/18/2013] darbepoetin (ARANESP) injection - DIALYSIS  100 mcg Intravenous Q Thu-HD  . lanthanum  2,000 mg Oral TID WC  . multivitamin  1 tablet Oral QHS  . pantoprazole  40 mg Oral Daily  . warfarin  1 mg Oral ONCE-1800  . Warfarin - Pharmacist Dosing Inpatient   Does not apply q1800      LOS: 5 days   Ripudeep Jenna LuoK Rai M.D. [facility] 07/15/2013, 10:49 AM Pager: [phone]  If 7PM-7AM, please contact night-coverage [email or web address] Password [email or web address]  **Disclaimer: This note was dictated with voice  recognition software. Similar sounding words can inadvertently be transcribed and this note may contain transcription errors which may not have been corrected upon publication of note.**

## 2013-07-15 NOTE — Progress Notes (Signed)
Subjective: Interval History: has complaints of twitching, unsteadiness, needs HD.  Objective: Vital signs in last 24 hours: Temp:  [98.1 F (36.7 C)-98.9 F (37.2 C)] 98.9 F (37.2 C) (04/27 0526) Pulse Rate:  [72-79] 72 (04/27 0526) Resp:  [18] 18 (04/27 0526) BP: (104-111)/(49-66) 104/49 mmHg (04/27 0526) SpO2:  [89 %-96 %] 96 % (04/27 0526) Weight change:   Intake/Output from previous day:   Intake/Output this shift:    General appearance: alert, cooperative and tremulous and twitching Resp: diminished breath sounds bilaterally Cardio: S1, S2 normal and systolic murmur: holosystolic 2/6, blowing at apex GI: obese, pos bs Extremities: AVF LLA  Lab Results:  Recent Labs  07/14/13 0345 07/15/13 0511  WBC 5.9 6.2  HGB 9.4* 9.2*  HCT 31.1* 29.5*  PLT 120* 102*   BMET:  Recent Labs  07/12/13 1252  NA 143  K 5.4*  CL 100  CO2 19  GLUCOSE 110*  BUN 79*  CREATININE 18.17*  CALCIUM 6.8*   No results found for this basename: PTH,  in the last 72 hours Iron Studies:  Recent Labs  07/12/13 1247  IRON 29*    Studies/Results: No results found.  I have reviewed the patient's current medications.  Assessment/Plan: 1 CRF needs HD.  Concern of myoclonus, ??? Uremia,  2 Myoclonus  ??? Uremia 3 HTN controlled 4 Obesity 5 AFib rate controlled and anticoag 6 Anemia ^ epo. 7 Pulm HTN 8 HPTH P HD, epo, anticoag, rate control    LOS: 5 days   Jovante Hammitt L Deo Mehringer 07/15/2013,7:45 AM

## 2013-07-15 NOTE — Progress Notes (Signed)
nOT Cancellation Note  Patient Details Name: ARMIN BRESSON MRN: [id number] DOB: May 10, 1960   Cancelled Treatment:      pt. OOR for HD.   Waldemar Dickens 07/15/2013, 11:18 AM

## 2013-07-15 NOTE — Progress Notes (Signed)
Chart reviewed.  Patient seen, interviewed and examined.  Agree with assessment and plan as outlined by Ward Givens NP.  Patient refusing beta blocker.  Maintaining NSR after TEE/DCCV.  Recommend followup with primary Cardiologist at [facility] this week after D/C to discuss further workup/management of worsening EF.  He is currently not on an ARB and refuses beta blockers.  Continue Coumadin and will have INR checked at HD and needs results sent to his cardiologist at [facility] for management.   No further recs at this time.  Will sign off.  Call with any questions.

## 2013-07-15 NOTE — Procedures (Signed)
I was present at this session.  I have reviewed the session itself and made appropriate changes.  HD via LLA AVF. BP rising with tx, ^ goal.  Follow myoclonus closely.  Gary Rivera 4/27/201512:03 PM

## 2013-07-16 LAB — PROTIME-INR
INR: 2.77 — ABNORMAL HIGH (ref 0.00–1.49)
Prothrombin Time: 28.3 seconds — ABNORMAL HIGH (ref 11.6–15.2)

## 2013-07-16 MED ORDER — SIMETHICONE 80 MG PO CHEW: 80.0000 mg | CHEWABLE_TABLET | Freq: Four times a day (QID) | ORAL | Status: DC | PRN

## 2013-07-16 MED ORDER — PANTOPRAZOLE SODIUM 40 MG PO TBEC: 40.0000 mg | DELAYED_RELEASE_TABLET | Freq: Every day | ORAL | Status: DC

## 2013-07-16 MED ORDER — WARFARIN SODIUM 5 MG PO TABS
5.0000 mg | ORAL_TABLET | Freq: Once | ORAL | Status: DC
  Filled 2013-07-16: qty 1

## 2013-07-16 MED ORDER — WARFARIN SODIUM 7.5 MG PO TABS
7.5000 mg | ORAL_TABLET | Freq: Once | ORAL | Status: DC
  Filled 2013-07-16: qty 1

## 2013-07-16 MED ORDER — CARVEDILOL 3.125 MG PO TABS: 3.1250 mg | ORAL_TABLET | Freq: Two times a day (BID) | ORAL | Status: DC

## 2013-07-16 MED ORDER — CEFUROXIME AXETIL 250 MG PO TABS: 250.0000 mg | ORAL_TABLET | Freq: Every day | ORAL | Status: DC

## 2013-07-16 MED ORDER — WARFARIN SODIUM 5 MG PO TABS: 5.0000 mg | ORAL_TABLET | Freq: Every day | ORAL | Status: DC

## 2013-07-16 NOTE — Discharge Summary (Signed)
**Note Gary-Identified via Obfuscation** Physician Discharge Summary  Patient ID: Gary Rivera MRN: [id number] DOB/AGE: 04-22-1960 53 y.o.  Admit date: 07/10/2013 Discharge date: 07/16/2013  Primary Care Physician:  [name], MD  Discharge Diagnoses:    . Atrial flutter with RVR . End stage renal disease-  on HD . Myoclonus likely due to uremia resolved  .   Acute sinusitis  . Chronic systolic congestive heart failure, NYHA class 1 . HTN (hypertension) . Hypotension . Anemia . Mitral regurgitation (MODERATE) . Obesity (BMI 30-39.9) . Obesity hypoventilation syndrome . NICM- recent EF "Nl" per pt- followed at [facility] . Pulmonary hypertension   Consults:  Nephrology, Dr. Darrick Penna                   Cardiology, Dr. Mayford Knife   Recommendations for Outpatient Follow-up:  Please check PT/INR tomorrow 07/17/2013 and adjust Coumadin dose accordingly  Patient also needs to have referral for sleep study  Allergies:   Allergies  Allergen Reactions  . Renvela [Sevelamer] Nausea And Vomiting     Discharge Medications:   Medication List    STOP taking these medications       aspirin EC 81 MG tablet      TAKE these medications       albuterol 108 (90 BASE) MCG/ACT inhaler  Commonly known as:  PROVENTIL HFA;VENTOLIN HFA  Inhale 1 puff into the lungs every 6 (six) hours as needed for wheezing or shortness of breath.     calcitRIOL 0.25 MCG capsule  Commonly known as:  ROCALTROL  Take 0.25 mcg by mouth daily.     carvedilol 3.125 MG tablet  Commonly known as:  COREG  Take 1 tablet (3.125 mg total) by mouth 2 (two) times daily with a meal.     cefUROXime 250 MG tablet  Commonly known as:  CEFTIN  Take 1 tablet (250 mg total) by mouth daily with supper. X 7days     clonazePAM 0.5 MG tablet  Commonly known as:  KLONOPIN  Take 0.5 mg by mouth 2 (two) times daily as needed. anxiety     lanthanum 1000 MG chewable tablet  Commonly known as:  FOSRENOL  Chew 1,000 mg by mouth 3 (three) times daily with meals.  Twice daily with meals and 1 with snacks     oxyCODONE-acetaminophen 5-325 MG per tablet  Commonly known as:  PERCOCET/ROXICET  Take 1-2 tablets by mouth every 4 (four) hours as needed (severe pain).     pantoprazole 40 MG tablet  Commonly known as:  PROTONIX  Take 1 tablet (40 mg total) by mouth daily.     simethicone 80 MG chewable tablet  Commonly known as:  MYLICON  Chew 1 tablet (80 mg total) by mouth 4 (four) times daily as needed for flatulence (also available over the counter).     warfarin 5 MG tablet  Commonly known as:  COUMADIN  Take 1 tablet (5 mg total) by mouth daily. Take 7.5 mg (1 and half tabs) on Monday, Wednesday and Friday, other days take 5 mg (1 tab).         Brief H and P: For complete details please refer to admission H and P, but in briefTerrance O Rivera is a 53 y.o. male.  He has established end stage renal disease; he is to be on peritoneal dialysis but then transitioned to hemodialysis approximately one year ago. His nephrologist is Dr. Felipa Rivera in the [facility]. The patient has been on home hemodialysis treatments via left  AV fistula which he takes 5 days per week. He denied any recent problems with his dialysis treatments. Also has a history of hypertension and congestive heart failure with reportedly EF as low as 22% but subsequent recovery, per his report.  The patient's current symptoms have been lasting approximately 2-3 days. He felt as if his pulse rate is high, which he notices as he takes his vital signs during home hemodialysis. He actually has minimal palpitations or lightheadedness. He occasionally has feelings of lightheadedness upon standing but this is reportedly a chronic issue and unrelated to his current presentation. The patient denied chest pain, orthopnea, lower extremity edema. He makes little urine. He denied any signs of infection except for modest temperature elevation of 99.1 which she reports is higher than his  norm.  Patient was evaluated as nephrology office and upon finding that he was tachycardic to the 130 to 140 range refer him to the emergency department for further evaluation and treatment. He was in atrial flutter with rapid ventricular rate; he was begun on a diltiazem infusion and required admission for further management   Hospital Course:  53 yo BM PMHx PHTN, Obesity Hypoventilation Syn, HTN, Hypotension, ESRD on Home HD, A-Fib/Fluter w/ RVR now on home HD -Gary Rivera is primary Nephrologist. Presented to the ER with fast heart rate noted with routine VS check during HD. Pt endorsed ongoing for ~ 2-3 days- only occasionally was aware of palpitations and light headed sensation.  In the ER his VR was 130-140 with underlying atrial flutter. He had a low grade temperature. He was started on a Cardizem gtt in the ED. Cardiology was consulted and recommended IV Heparin for anti-coagulation and consideration for DCCV if atrial flutter with RVR persists. Patient was admitted to step down unit for further workup and  Atrial flutter with rapid ventricular response : now in NSR . Cardiology was consulted. A TEE was obtained and showed no LA/LAA thrombus, LVEF and RVEF is very depressed. Patient was cardioverted on 07/11/13, currently maintaining normal sinus rhythm after that. He was recommended followup with her primary cardiologist at [facility] this week after discharge to further manage the worsening EF. Patient is not on ARB and refuses beta blocker. Once INR was therapeutic for over 24 hours, heparin drip was discontinued. He will continue Coumadin and followup with Dr Gary Rivera (Cardiologist) after DC. He needs PT/INR check tomorrow, for Coumadin dosing.    CKD (chronic kidney disease) stage V requiring chronic dialysis, patient was noticed to have uremia symptoms with myoclonus yesterday on 07/15/2013. Patient underwent urgent dialysis and his symptoms of myoclonus were improved. Followup w/ DO Gary Rivera (Nephrology).   Hypotension - due to RVR which stabilized after DCCV   Acute sinusitis with low-grade fevers/URI symptoms  - Started on Ceftin for 7 days   Chronic systolic congestive heart failure, NYHA class 1 , history of moderate mitral regurgitation  -compensated and treated with HD  - Follow with Dr Gary Rivera (Cardiologist) outpatient   HTN (hypertension)  -not on meds at home- primary control with HD, on  Coreg 3.125 mg BID however patient intermittently refuses Coreg.  Pulmonary HTN- 50 mmHg 2011 ECHO/Obesity hypoventilation syndrome, obstructive sleep apnea  - Patient qualified for home O2 evaluation at discharge, arranged by the case manager. He will need to establish care with pulmonologist recommend Dr. Odelia Gage. [facility]; requires a sleep study.   Anemia of CKD  -hgb has been variable over the past 2 yrs-  as low as 7-9 yet pt reports for at least 1 yr around 11-12       Day of Discharge BP 127/74  Pulse 84  Temp(Src) 98.3 F (36.8 C) (Oral)  Resp 20  Ht 6\' 2"  (1.88 m)  Wt 140.5 kg (309 lb 11.9 oz)  BMI 39.75 kg/m2  SpO2 93%  Physical Exam:  General: Alert and awake, oriented x3, tremulous, twitching in the upper body  CVS: S1-S2 clear, holosystolic murmur 2/6  Chest: Bibasilar crackles  Abdomen: soft nontender, nondistended, normal bowel sounds  Extremities: no cyanosis, clubbing or edema noted bilaterally  Neuro: Cranial nerves II-XII intact, no focal neurological deficits  The results of significant diagnostics from this hospitalization (including imaging, microbiology, ancillary and laboratory) are listed below for reference.    LAB RESULTS: Basic Metabolic Panel:  Recent Labs Lab 07/10/13 2200  07/12/13 1252 07/15/13 1149  NA  --   < > 143 139  K  --   < > 5.4* 5.7*  CL  --   < > 100 95*  CO2  --   < > 19 20  GLUCOSE  --   < > 110* 88  BUN  --   < > 79* 83*  CREATININE  --   < > 18.17* 19.21*  CALCIUM  --   < >  6.8* 7.5*  MG 1.8  --   --   --   PHOS 7.2*  --  9.9* 9.5*  < > = values in this interval not displayed. Liver Function Tests:  Recent Labs Lab 07/12/13 1252  ALBUMIN 3.3*   No results found for this basename: LIPASE, AMYLASE,  in the last 168 hours No results found for this basename: AMMONIA,  in the last 168 hours CBC:  Recent Labs Lab 07/14/13 0345 07/15/13 0511  WBC 5.9 6.2  HGB 9.4* 9.2*  HCT 31.1* 29.5*  MCV 99.0 96.1  PLT 120* 102*   Cardiac Enzymes:  Recent Labs Lab 07/11/13 0030 07/11/13 0823  TROPONINI <0.30 <0.30   BNP: No components found with this basename: POCBNP,  CBG: No results found for this basename: GLUCAP,  in the last 168 hours  Significant Diagnostic Studies:  Dg Chest Portable 1 View  07/10/2013   CLINICAL DATA:  Bronchitis, hypertension, tachycardia  EXAM: PORTABLE CHEST - 1 VIEW  COMPARISON:  05/08/2013  FINDINGS: Stable cardiomegaly with vascular congestion. No focal pneumonia, collapse or consolidation. No edema, effusion or pneumothorax. Trachea midline.  IMPRESSION: Stable cardiomegaly with vascular congestion. No superimposed acute process.   Electronically Signed   By: Ruel Favorsrevor  Shick M.D.   On: 07/10/2013 17:03     Transesophageal echocardiography with cardioversion. 2D and intravenous contrast injection. Height: Height: 188cm. Height: 74in. Weight: Weight: 137.3kg. Weight: 302lb. Body mass index: BMI: 38.9kg/m^2. Body surface area: BSA: 2.4112m^2. Blood pressure: 104/70. Patient status: Inpatient. Location: Endoscopy.  ------------------------------------------------------------  ------------------------------------------------------------ Left ventricle: LVEF is severely depressed with diffuse hypokinesis. No evidence of thrombus.  ------------------------------------------------------------ Aortic valve: AV is normal. Mild aortic insufficiency.  ------------------------------------------------------------ Aorta: Aorta is  normal.  ------------------------------------------------------------ Mitral valve: MV is normal. Mild insufficiency.  ------------------------------------------------------------ Left atrium: LA is severely dilated. No evidence of thrombus in the atrial cavity or appendage.  ------------------------------------------------------------ Right ventricle: RVEF is severely depressed.  ------------------------------------------------------------ Tricuspid valve: TV is normal Mild TR.  ------------------------------------------------------------ Post procedure conclusions Ascending Aorta:  - Aorta is normal.     Disposition and Follow-up:     Discharge Orders   Future Appointments  Provider Department Dept Phone   07/22/2013 10:30 AM Almond Lint, MD [facility], [place] [phone]   Future Orders Complete By Expires   Diet - low sodium heart healthy  As directed    Discharge instructions  As directed    Increase activity slowly  As directed        DISPOSITION: Home  DIET: Heart healthy diet    DISCHARGE FOLLOW-UP Follow-up Information   Follow up with [facility]. (HH-RN-  (for INR check on 07/17/13) fax Dr Gary Rivera (Cardiologist))    Contact information:   [address] [place] [address] [phone]       Follow up with Gary Humphrey, MD. Schedule an appointment as soon as possible for a visit in 1 week. (for hospital follow-up, you need to have PT/INR check tomorrow, also establish with their coumadin clinic )    Specialty:  Internal Medicine   Contact information:   [address] [place] [address] [phone]       Follow up with Lynett Fish, MD. Schedule an appointment as soon as possible for a visit in 2 weeks. (for hospital follow-up, you need sleep study )    Specialty:  Pediatric Pulmonology   Contact information:   [facility] [address] [place] [address] [phone]       Time  spent on Discharge: 45 mins  Signed:   Cathren Harsh M.D. [facility] 07/16/2013, 9:49 AM Pager: [phone]   **Disclaimer: This note was dictated with voice recognition software. Similar sounding words can inadvertently be transcribed and this note may contain transcription errors which may not have been corrected upon publication of note.**

## 2013-07-16 NOTE — Progress Notes (Signed)
Patient is going to his sister's house. CSW set up cab transportation for patient. Patient reported that his sister would be at the home to let him into the house.  Clinical Social Worker will sign off for now as social work intervention is no longer needed. Please consult [place] again if new need arises.   Sabino Niemann, MSW, [employer] [phone]

## 2013-07-16 NOTE — Progress Notes (Signed)
HOME HEALTH AGENCIES SERVING [place]   Agencies that are Medicare-Certified and are affiliated with [facility]  Telephone Number Address  [facility]   [facility] has ownership interest in this company; however, you are under no obligation to use this agency. [phone] or  [phone] [address] [place], [place] [address] [email or web address]   Agencies that are Medicare-Certified and are not affiliated with [facility]                                                                                 Home Health Agency Telephone Number Address  [facility] [phone] Fax [phone] [address], [address] [place], [place]  [address] [email or web address]  [facility] [phone] or [phone] Fax [phone] [address] [place], [place] [address] [email or web address]  [facility] [phone] Fax [phone] [address] [place], [place] [address] [email or web address]  [facility] [phone] Fax [phone] [address] [place], [place]  [address] [email or web address]  [facility] [phone] Fax [phone] [address], [address] [place], [place] [address] [email or web address]  [facility] [phone] [address] [place], [place] [address] [email or web address]  [facility] [phone]  [address] [place], [place] [address] [email or web address]  [facility] [phone] or [phone] Fax number [phone] [address] [place], [place]  [address] [email or web address]  [facility] [phone] Fax [phone] [address] [place], [place]  [address]  [facility]  [phone] Fax [phone] [address] [place], [place] [address] [email or web address]       Agencies that are not Medicare-Certified and are not affiliated with [facility]   Home Health Agency Telephone Number Address  [facility] [phone] or [phone] Fax [phone] [address] [place], [place]  [address] [email or web address]  [facility] [phone] Fax [phone] [address] [place], [place]  [address] [email or web address]  [facility] [phone] Fax [phone] [address] [place], [place]  [address] [email or web address]  [facility]  [phone] Fax [phone] [address] [place], [place] [address] [email or web address]  [facility] [phone] Fax [phone] [address] [place], [place] [address]  [facility] [phone] or [phone] Fax [phone] [address] [place], [place]  [address] [email or web address]  [facility] [phone] or [phone] Fax [phone] [address] [place], [place]  [address] [email or web address]  [facility] [phone] Fax [phone] [address] [place], [place]  [address] [email or web address]  [facility] [phone] [address] [place], [place]  [address]  [facility] [phone] Fax [phone] [address] [place], [place]  [address]  [facility] [phone] Fax [phone] [address] [place], [place]  [address] [email or web address]  [facility] [phone] Fax [phone] [address] [place], [place] [address] [email or web address]  [facility] [phone] Fax [phone] [address] , [place]    [address] [email or web address]   

## 2013-07-16 NOTE — Progress Notes (Signed)
OT Cancellation Note  Patient Details Name: Gary Rivera MRN: [id number] DOB: 06-07-1960   Cancelled Treatment:    Reason Eval/Treat Not Completed: OT screened, no needs identified, will sign off  [facility], OTR/L  [phone] 07/16/2013 07/16/2013, 8:54 AM

## 2013-07-16 NOTE — Progress Notes (Signed)
SATURATION QUALIFICATIONS: (This note is used to comply with regulatory documentation for home oxygen)  Patient Saturations on Room Air at Rest = 84%  Patient Saturations on Room Air while Ambulating = N/A  Patient Saturations on 3 Liters of oxygen while Ambulating =97%  Please briefly explain why patient needs home oxygen:  CHF

## 2013-07-16 NOTE — Progress Notes (Signed)
Subjective: Interval History: has no complaint of twitching, much better.  Objective: Vital signs in last 24 hours: Temp:  [98.2 F (36.8 C)-99.2 F (37.3 C)] 98.3 F (36.8 C) (04/28 0517) Pulse Rate:  [71-84] 84 (04/28 0517) Resp:  [17-28] 20 (04/28 0517) BP: (105-150)/(53-83) 127/74 mmHg (04/28 0517) SpO2:  [83 %-99 %] 83 % (04/28 0823) Weight:  [140.5 kg (309 lb 11.9 oz)-143.2 kg (315 lb 11.2 oz)] 140.5 kg (309 lb 11.9 oz) (04/28 0517) Weight change:   Intake/Output from previous day: 04/27 0701 - 04/28 0700 In: 720 [P.O.:720] Out: 2434 [Stool:1] Intake/Output this shift: Total I/O In: 240 [P.O.:240] Out: -   General appearance: alert, cooperative, no distress and moderately obese Resp: diminished breath sounds bilaterally Cardio: S1, S2 normal and systolic murmur: holosystolic 2/6, blowing at apex GI: obese , pos bs, liver down 5 cm Extremities: AVF LLA B&T, scar in antecub  Lab Results:  Recent Labs  07/14/13 0345 07/15/13 0511  WBC 5.9 6.2  HGB 9.4* 9.2*  HCT 31.1* 29.5*  PLT 120* 102*   BMET:  Recent Labs  07/15/13 1149  NA 139  K 5.7*  CL 95*  CO2 20  GLUCOSE 88  BUN 83*  CREATININE 19.21*  CALCIUM 7.5*   No results found for this basename: PTH,  in the last 72 hours Iron Studies: No results found for this basename: IRON, TIBC, TRANSFERRIN, FERRITIN,  in the last 72 hours  Studies/Results: No results found.  I have reviewed the patient's current medications.  Assessment/Plan: 1 ESRD  Needs HD again.  Myoclonus resolved with less uremia.  Can do at home 2 Obesity 3 Afib per primary 4 HTN controlled 5 Anemia epo  6 HPTH P can d/c home and do Home dialysis today   LOS: 6 days   Gary Rivera Gary Rivera 07/16/2013,8:43 AM

## 2013-07-16 NOTE — Progress Notes (Addendum)
ANTICOAGULATION CONSULT NOTE - Follow Up Consult  Pharmacy Consult for heparin/coumadin Indication: atrial fibrillation  Allergies  Allergen Reactions  . Renvela [Sevelamer] Nausea And Vomiting    Patient Measurements: Height: 6\' 2"  (188 cm) Weight: 309 lb 11.9 oz (140.5 kg) IBW/kg (Calculated) : 82.2 Heparin Dosing Weight: 112 kg  Vital Signs: Temp: 98.3 F (36.8 C) (04/28 0517) Temp src: Oral (04/28 0517) BP: 127/74 mmHg (04/28 0517) Pulse Rate: 84 (04/28 0517)  Labs:  Recent Labs  07/13/13 2030 07/14/13 0345 07/15/13 0511 07/15/13 1149 07/16/13 0535  HGB  --  9.4* 9.2*  --   --   HCT  --  31.1* 29.5*  --   --   PLT  --  120* 102*  --   --   LABPROT  --  22.4* 33.0*  --  28.3*  INR  --  2.04* 3.39*  --  2.77*  HEPARINUNFRC 0.50 0.36 0.47  --   --   CREATININE  --   --   --  19.21*  --     Estimated Creatinine Clearance: 6.6 ml/min (by C-G formula based on Cr of 19.21).  Assessment: 53yo male with aflutter s/p cardioversion to SR on coumadin. INR 2.77 this morning. No bleeding noted.  Day # 3/7 of ceftin for URI. AF, WBC wnl. Plan to go home today  Goal of Therapy: INR 2-3   Plan:  -Coumadin 5 mg po x 1 dose today -If goes home recommend 7.5mg  on MWF, 5mg  on other days, pt. needs to have INR check tomorrow or Tursday to determine appropriate dose. -Daily PT/INR,  Stop daily CBC and heparin level  Thanks for allowing pharmacy to be a part of this patient's care. Bayard Hugger, PharmD, BCPS  Clinical Pharmacist  Pager: [phone]  07/16/2013 8:29 AM

## 2013-07-22 ENCOUNTER — Encounter (INDEPENDENT_AMBULATORY_CARE_PROVIDER_SITE_OTHER): Payer: Self-pay

## 2013-07-22 ENCOUNTER — Encounter (INDEPENDENT_AMBULATORY_CARE_PROVIDER_SITE_OTHER): Payer: Self-pay | Admitting: General Surgery

## 2013-07-22 ENCOUNTER — Ambulatory Visit (INDEPENDENT_AMBULATORY_CARE_PROVIDER_SITE_OTHER): Payer: Medicare Other | Admitting: General Surgery

## 2013-07-22 VITALS — BP 122/82 | HR 80 | Temp 97.3°F | Resp 16 | Ht 74.0 in | Wt 304.0 lb

## 2013-07-22 DIAGNOSIS — K8020 Calculus of gallbladder without cholecystitis without obstruction: Secondary | ICD-10-CM

## 2013-07-22 MED ORDER — URSODIOL 300 MG PO CAPS: 300.0000 mg | ORAL_CAPSULE | Freq: Three times a day (TID) | ORAL | Status: DC

## 2013-07-22 NOTE — Patient Instructions (Addendum)
We will send off information to your cardiologist about possible surgery.  We will ask about holding coumadin and risk of surgery.    Please call if you are having issues.  Otherwise, I will recheck you in around 3-6 months.

## 2013-07-22 NOTE — Progress Notes (Signed)
HISTORY: Patient is a 53 year old male who was seen in December at the emergency department because of nausea and vomiting.  Heme is not having any significant abdominal pain. He is a dialysis patient on Monday Wednesday Friday. He also underwent cardiac rule out and this was negative. He denies any residual nausea, vomiting, or abdominal pain. He denies diarrhea, belching, or significant flatulence.  He is not having any fevers, chills, or jaundice.  In the emergency department, he had a mild elevation of his AST at 39 with the upper lung normal at 37. His albumin was 2.4. The remainder of his LFTs were acceptable.  His lipase was also slightly above the normal range at 69 compared to 59.  He is very healthy diet and drinking lemon juice.  Of note, he was recently in the hospital for atrial flutter and required cardioversion.  He was started on coumadin as well.     PERTINENT REVIEW OF SYSTEMS: O/w negative x 11.    Filed Vitals:   07/22/13 0944  BP: 122/82  Pulse: 80  Temp: 97.3 F (36.3 C)  Resp: 16   Wt Readings from Last 3 Encounters:  07/22/13 304 lb (137.893 kg)  07/16/13 309 lb 11.9 oz (140.5 kg)  07/16/13 309 lb 11.9 oz (140.5 kg)    EXAM: Head: Normocephalic and atraumatic.  Eyes:  Conjunctivae are normal. Pupils are equal, round, and reactive to light. No scleral icterus.  Neck:  Normal range of motion. Neck supple. No tracheal deviation present. No thyromegaly present.  Resp: No respiratory distress, normal effort. Abd:  Abdomen is soft, non distended and non tender. No masses are palpable.  There is no rebound and no guarding.  Neurological: Alert and oriented to person, place, and time. Coordination normal.  Skin: Skin is warm and dry. No rash noted. No diaphoretic. No erythema. No pallor.  Psychiatric: Normal mood and affect. Normal behavior. Judgment and thought content normal.     U/S IMPRESSION:  1. No acute abnormalities seen to explain the patient's symptoms.   2. Cholelithiasis noted; gallbladder otherwise unremarkable in  appearance.  3. Relatively stable 4.4 cm right renal mass, only minimally changed  from 2011 and likely benign.  4. Scattered bilateral renal cysts.  5. Splenomegaly noted.     ASSESSMENT AND PLAN:   Cholelithiasis Patient was given a prescription for Actigall. I advised him this would not make his existing gallstones go away.  The patient has had one episode of symptoms in December. He is eating healthfully and I think that his risks of surgery are probably higher at this point than his risk of observation.  I would like to go ahead and obtain cardiac clearance from his cardiologist in case he does have recurrent symptoms. In this way, we would be able to go ahead and schedule surgery. He does have a history of congestive heart failure with systolic dysfunction. He also has a history of atrial flutter and is on Coumadin. We will need to stop this for 4 or 5 days. I am not sure whether his cardiologist would recommend a Lovenox bridge or not.  I will recheck him in 3-6 months to see how his abdominal symptoms are doing. He is instructed to call us earlier if he develops recurrent issues.      Maudry Diego, MD Surgical Oncology, General & Endocrine Surgery [facility], Burtis Junes, MD Bruna Potter Viviana Simpler, *

## 2013-07-22 NOTE — Assessment & Plan Note (Signed)
Patient was given a prescription for Actigall. I advised him this would not make his existing gallstones go away.  The patient has had one episode of symptoms in December. He is eating healthfully and I think that his risks of surgery are probably higher at this point than his risk of observation.  I would like to go ahead and obtain cardiac clearance from his cardiologist in case he does have recurrent symptoms. In this way, we would be able to go ahead and schedule surgery. He does have a history of congestive heart failure with systolic dysfunction. He also has a history of atrial flutter and is on Coumadin. We will need to stop this for 4 or 5 days. I am not sure whether his cardiologist would recommend a Lovenox bridge or not.  I will recheck him in 3-6 months to see how his abdominal symptoms are doing. He is instructed to call us earlier if he develops recurrent issues.

## 2013-10-03 ENCOUNTER — Encounter (INDEPENDENT_AMBULATORY_CARE_PROVIDER_SITE_OTHER): Payer: Self-pay

## 2013-10-03 ENCOUNTER — Telehealth (INDEPENDENT_AMBULATORY_CARE_PROVIDER_SITE_OTHER): Payer: Self-pay

## 2013-10-03 NOTE — Telephone Encounter (Signed)
Letter stating pt was seen in clinic by Dr. Donell Beers on 07/22/13 was faxed to transportation at [phone]

## 2014-01-12 ENCOUNTER — Encounter (HOSPITAL_COMMUNITY): Payer: Self-pay | Admitting: Emergency Medicine

## 2014-01-12 ENCOUNTER — Emergency Department (HOSPITAL_COMMUNITY): Payer: Medicare Other

## 2014-01-12 ENCOUNTER — Other Ambulatory Visit: Payer: Self-pay

## 2014-01-12 ENCOUNTER — Emergency Department (HOSPITAL_COMMUNITY)
Admission: EM | Admit: 2014-01-12 | Discharge: 2014-01-12 | Disposition: A | Discharge status: Home / Self Care | Payer: Medicare Other | Attending: Emergency Medicine | Admitting: Emergency Medicine

## 2014-01-12 DIAGNOSIS — E662 Morbid (severe) obesity with alveolar hypoventilation: Secondary | ICD-10-CM | POA: Diagnosis present

## 2014-01-12 DIAGNOSIS — I4892 Unspecified atrial flutter: Secondary | ICD-10-CM

## 2014-01-12 DIAGNOSIS — I120 Hypertensive chronic kidney disease with stage 5 chronic kidney disease or end stage renal disease: Secondary | ICD-10-CM | POA: Insufficient documentation

## 2014-01-12 DIAGNOSIS — I509 Heart failure, unspecified: Secondary | ICD-10-CM | POA: Insufficient documentation

## 2014-01-12 DIAGNOSIS — I4891 Unspecified atrial fibrillation: Secondary | ICD-10-CM | POA: Diagnosis not present

## 2014-01-12 DIAGNOSIS — R000 Tachycardia, unspecified: Secondary | ICD-10-CM

## 2014-01-12 DIAGNOSIS — I429 Cardiomyopathy, unspecified: Secondary | ICD-10-CM

## 2014-01-12 DIAGNOSIS — I340 Nonrheumatic mitral (valve) insufficiency: Secondary | ICD-10-CM | POA: Diagnosis present

## 2014-01-12 DIAGNOSIS — Z792 Long term (current) use of antibiotics: Secondary | ICD-10-CM | POA: Insufficient documentation

## 2014-01-12 DIAGNOSIS — I959 Hypotension, unspecified: Secondary | ICD-10-CM | POA: Diagnosis present

## 2014-01-12 DIAGNOSIS — N186 End stage renal disease: Secondary | ICD-10-CM

## 2014-01-12 DIAGNOSIS — E669 Obesity, unspecified: Secondary | ICD-10-CM | POA: Diagnosis present

## 2014-01-12 DIAGNOSIS — Z7901 Long term (current) use of anticoagulants: Secondary | ICD-10-CM | POA: Insufficient documentation

## 2014-01-12 DIAGNOSIS — Z8659 Personal history of other mental and behavioral disorders: Secondary | ICD-10-CM | POA: Insufficient documentation

## 2014-01-12 DIAGNOSIS — Z79899 Other long term (current) drug therapy: Secondary | ICD-10-CM | POA: Insufficient documentation

## 2014-01-12 DIAGNOSIS — Z862 Personal history of diseases of the blood and blood-forming organs and certain disorders involving the immune mechanism: Secondary | ICD-10-CM | POA: Insufficient documentation

## 2014-01-12 DIAGNOSIS — I272 Pulmonary hypertension, unspecified: Secondary | ICD-10-CM | POA: Diagnosis present

## 2014-01-12 DIAGNOSIS — Z9889 Other specified postprocedural states: Secondary | ICD-10-CM | POA: Diagnosis not present

## 2014-01-12 DIAGNOSIS — I953 Hypotension of hemodialysis: Secondary | ICD-10-CM

## 2014-01-12 DIAGNOSIS — I428 Other cardiomyopathies: Secondary | ICD-10-CM

## 2014-01-12 LAB — CBC WITH DIFFERENTIAL/PLATELET
BASOS ABS: 0 10*3/uL (ref 0.0–0.1)
Basophils Relative: 0 % (ref 0–1)
EOS PCT: 4 % (ref 0–5)
Eosinophils Absolute: 0.3 10*3/uL (ref 0.0–0.7)
HCT: 38.3 % — ABNORMAL LOW (ref 39.0–52.0)
Hemoglobin: 12.1 g/dL — ABNORMAL LOW (ref 13.0–17.0)
Lymphocytes Relative: 26 % (ref 12–46)
Lymphs Abs: 2 10*3/uL (ref 0.7–4.0)
MCH: 30.2 pg (ref 26.0–34.0)
MCHC: 31.6 g/dL (ref 30.0–36.0)
MCV: 95.5 fL (ref 78.0–100.0)
Monocytes Absolute: 0.6 10*3/uL (ref 0.1–1.0)
Monocytes Relative: 8 % (ref 3–12)
NEUTROS ABS: 4.7 10*3/uL (ref 1.7–7.7)
Neutrophils Relative %: 62 % (ref 43–77)
PLATELETS: 128 10*3/uL — AB (ref 150–400)
RBC: 4.01 MIL/uL — ABNORMAL LOW (ref 4.22–5.81)
RDW: 14.8 % (ref 11.5–15.5)
WBC: 7.6 10*3/uL (ref 4.0–10.5)

## 2014-01-12 LAB — PROTIME-INR
INR: 1.05 (ref 0.00–1.49)
Prothrombin Time: 13.8 seconds (ref 11.6–15.2)

## 2014-01-12 LAB — COMPREHENSIVE METABOLIC PANEL
ALBUMIN: 3.5 g/dL (ref 3.5–5.2)
ALT: 10 U/L (ref 0–53)
AST: 17 U/L (ref 0–37)
Alkaline Phosphatase: 59 U/L (ref 39–117)
Anion gap: 24 — ABNORMAL HIGH (ref 5–15)
BUN: 76 mg/dL — ABNORMAL HIGH (ref 6–23)
CALCIUM: 8.7 mg/dL (ref 8.4–10.5)
CHLORIDE: 98 meq/L (ref 96–112)
CO2: 21 meq/L (ref 19–32)
Creatinine, Ser: 17.51 mg/dL — ABNORMAL HIGH (ref 0.50–1.35)
GFR calc Af Amer: 3 mL/min — ABNORMAL LOW (ref >90)
GFR, EST NON AFRICAN AMERICAN: 3 mL/min — AB (ref >90)
Glucose, Bld: 100 mg/dL — ABNORMAL HIGH (ref 70–99)
Potassium: 4.6 mEq/L (ref 3.7–5.3)
SODIUM: 143 meq/L (ref 137–147)
Total Bilirubin: 0.4 mg/dL (ref 0.3–1.2)
Total Protein: 7.8 g/dL (ref 6.0–8.3)

## 2014-01-12 LAB — I-STAT CHEM 8, ED
BUN: 79 mg/dL — ABNORMAL HIGH (ref 6–23)
CALCIUM ION: 1.02 mmol/L — AB (ref 1.12–1.23)
Chloride: 105 mEq/L (ref 96–112)
Creatinine, Ser: 17.9 mg/dL — ABNORMAL HIGH (ref 0.50–1.35)
Glucose, Bld: 104 mg/dL — ABNORMAL HIGH (ref 70–99)
HEMATOCRIT: 41 % (ref 39.0–52.0)
Hemoglobin: 13.9 g/dL (ref 13.0–17.0)
Potassium: 4.3 mEq/L (ref 3.7–5.3)
Sodium: 141 mEq/L (ref 137–147)
TCO2: 22 mmol/L (ref 0–100)

## 2014-01-12 LAB — TROPONIN I: Troponin I: <0.3 ng/mL (ref <0.30)

## 2014-01-12 MED ORDER — WARFARIN SODIUM 5 MG PO TABS: 5.0000 mg | ORAL_TABLET | Freq: Every day | ORAL | Status: DC

## 2014-01-12 MED ORDER — DILTIAZEM HCL 100 MG IV SOLR
5.0000 mg/h | Freq: Once | INTRAVENOUS | Status: AC
  Administered 2014-01-12: 5 mg/h via INTRAVENOUS

## 2014-01-12 MED ORDER — PROPOFOL 10 MG/ML IV BOLUS
0.5000 mg/kg | Freq: Once | INTRAVENOUS | Status: AC
  Administered 2014-01-12: 70 mg via INTRAVENOUS
  Filled 2014-01-12: qty 20

## 2014-01-12 NOTE — ED Provider Notes (Signed)
CSN: [id number]     Arrival date & time 01/12/14  4098 History   First MD Initiated Contact with Patient 01/12/14 0606     Chief Complaint  Patient presents with  . Tachycardia     (Consider location/radiation/quality/duration/timing/severity/associated sxs/prior Treatment) HPI Patient presents with concern of palpitations, hypertension. Patient was preparing for home dialysis session when he found himself to be tachycardic. Patient denies chest pain, dyspnea, lightheadedness, syncope, nausea or other focal complaints. After the tachycardia was intermittent, but persistent, he called his dialysis center, was referred here for evaluation. Patient states that he has been generally well, taking all medication as directed until this morning. Patient notes prior similar events, including recent hospitalization for this.  Past Medical History  Diagnosis Date  . ESRD on hemodialysis     Started dialysis with PD in 2013 and switched to HD in spring 2014 because of fungal peritonitis.  Takes dialysis at home 5 days a week approx 3h 15 min each session.     . CHF (congestive heart failure)   . Peritoneal dialysis catheter in place   . Anemia     patient reporats that last hgb 7.9 a week ago  . Anxiety state, unspecified   . Hypertension    Past Surgical History  Procedure Laterality Date  . Appendectomy    . Peritoneal catheter insertion    . Tee without cardioversion N/A 07/11/2013    Procedure: TRANSESOPHAGEAL ECHOCARDIOGRAM (TEE);  Surgeon: Pricilla Riffle, MD;  Location: [facility] ENDOSCOPY;  Service: Cardiovascular;  Laterality: N/A;  . Cardioversion N/A 07/11/2013    Procedure: CARDIOVERSION;  Surgeon: Pricilla Riffle, MD;  Location[facility] ENDOSCOPY;  Service: Cardiovascular;  Laterality: N/A;   Family History  Problem Relation Age of Onset  . Diabetes Mother   . Hypertension Mother   . Hypertension Sister   . Asthma Sister    History  Substance Use Topics  . Smoking status: Never Smoker    . Smokeless tobacco: Never Used  . Alcohol Use: No    Review of Systems  Constitutional:       Per HPI, otherwise negative  HENT:       Per HPI, otherwise negative  Respiratory:       Per HPI, otherwise negative  Cardiovascular:       Per HPI, otherwise negative  Gastrointestinal: Negative for vomiting.  Endocrine:       Negative aside from HPI  Genitourinary:       Neg aside from HPI   Musculoskeletal:       Per HPI, otherwise negative  Skin: Negative.   Neurological: Negative for syncope.      Allergies  Renvela  Home Medications   Prior to Admission medications   Medication Sig Start Date End Date Taking? Authorizing Provider  albuterol (PROVENTIL HFA;VENTOLIN HFA) 108 (90 BASE) MCG/ACT inhaler Inhale 1 puff into the lungs every 6 (six) hours as needed for wheezing or shortness of breath.    Historical Provider, MD  bisoprolol (ZEBETA) 5 MG tablet Take 2.5 mg by mouth daily.    Historical Provider, MD  calcitRIOL (ROCALTROL) 0.25 MCG capsule Take 0.25 mcg by mouth daily.    Historical Provider, MD  cefUROXime (CEFTIN) 250 MG tablet Take 1 tablet (250 mg total) by mouth daily with supper. X 7days 07/16/13   Ripudeep Jenna Luo, MD  clonazePAM (KLONOPIN) 0.5 MG tablet Take 0.5 mg by mouth 2 (two) times daily as needed. anxiety    Historical Provider, MD  lanthanum (FOSRENOL) 1000 MG chewable tablet Chew 1,000 mg by mouth 3 (three) times daily with meals. Twice daily with meals and 1 with snacks    Historical Provider, MD  omeprazole (PRILOSEC) 10 MG capsule Take 10 mg by mouth daily.    Historical Provider, MD  oxyCODONE-acetaminophen (PERCOCET/ROXICET) 5-325 MG per tablet Take 1-2 tablets by mouth every 4 (four) hours as needed (severe pain). 07/16/12   Zannie Cove, MD  simethicone (MYLICON) 80 MG chewable tablet Chew 1 tablet (80 mg total) by mouth 4 (four) times daily as needed for flatulence (also available over the counter). 07/16/13   Ripudeep Jenna Luo, MD  ursodiol  (ACTIGALL) 300 MG capsule Take 1 capsule (300 mg total) by mouth 3 (three) times daily. 07/22/13   Almond Lint, MD  warfarin (COUMADIN) 5 MG tablet Take 1 tablet (5 mg total) by mouth daily. Take 7.5 mg (1 and half tabs) on Monday, Wednesday and Friday, other days take 5 mg (1 tab). 07/16/13   Ripudeep K Rai, MD   BP 84/54  Pulse 138  Temp(Src) 98.1 F (36.7 C) (Oral)  Resp 22  SpO2 92% Physical Exam  Nursing note and vitals reviewed. Constitutional: He is oriented to person, place, and time. He appears well-developed. No distress.  HENT:  Head: Normocephalic and atraumatic.  Eyes: Conjunctivae and EOM are normal.  Cardiovascular: Regular rhythm.  Tachycardia present.   Pulmonary/Chest: Effort normal. No stridor. No respiratory distress.  Abdominal: He exhibits no distension.  Soft nontender abdomen  Musculoskeletal: He exhibits no edema.  Left fistula with palpable thrill  Neurological: He is alert and oriented to person, place, and time.  Skin: Skin is warm and dry.  Psychiatric: He has a normal mood and affect.    ED Course  Procedures (including critical care time) Labs Review Labs Reviewed  CBC WITH DIFFERENTIAL - Abnormal; Notable for the following:    RBC 4.01 (*)    Hemoglobin 12.1 (*)    HCT 38.3 (*)    Platelets 128 (*)    All other components within normal limits  COMPREHENSIVE METABOLIC PANEL - Abnormal; Notable for the following:    Glucose, Bld 100 (*)    BUN 76 (*)    Creatinine, Ser 17.51 (*)    GFR calc non Af Amer 3 (*)    GFR calc Af Amer 3 (*)    Anion gap 24 (*)    All other components within normal limits  I-STAT CHEM 8, ED - Abnormal; Notable for the following:    BUN 79 (*)    Creatinine, Ser 17.90 (*)    Glucose, Bld 104 (*)    Calcium, Ion 1.02 (*)    All other components within normal limits  TROPONIN I  PROTIME-INR  I-STAT CHEM 8, ED   A review of the patient's chart demonstrates that the patient has had cardio version, as well as  Cardizem during similar presentations, including within the past year when he was tachycardic, hypotensive.   EKG had atrial flutter, rate 143, T-wave changes, abnormal On monitor patient also had atrial flutter, rate 1:30/140s, abnormal Pulse oximetry 99% with room air normal  Given the patient's tachycardia, consistent with atrial flutter, patient was started on Cardizem after my initial evaluation. Patient had slight hypotension on arrival.  Update: On repeat exam the patient has persistent tachycardia, blood pressure remains low, total maps are greater than 75.   Update: I discussed patient's case with our cardiology team.   Update: Patient  remains tachycardic, blood pressure remains low. Patient states that he has not been taking anticoagulant.  MDM   Final diagnoses:  Tachycardia  ESRD (end stage renal disease)    Patient presents with tachycardia. Patient has multiple medical problems, including home hemodialysis, CHF. Patient has a history of atrial fibrillation, previously requiring cardioversion, electric. Today, the patient is awake and alert, though he is hypotensive, tachycardic on arrival. Given the patient's tachycardia, hypotension, I discussed this case, soon after arrival with cardiology. Patient was started on Cardizem. On several subsequent repeat evaluations the patient remained tachycardic, with mildly worsening hypotension, possibly worsened by Cardizem. On sign out, the patient was still on a Cardizem drip, awaiting cardiology evaluation.  (Late addendum) Cardiology evaluated patient and after some time on Cardizem, with no substantial reduction in heart rate, normal with cardioversion, and with persistent hypotension, patient required elective cardioversion in the emergency department. This was well tolerated, and the patient was discharged.   CRITICAL CARE Performed by: Gerhard MunchLOCKWOOD, Kineta Fudala Total critical care time: 35 Critical care time was exclusive  of separately billable procedures and treating other patients. Critical care was necessary to treat or prevent imminent or life-threatening deterioration. Critical care was time spent personally by me on the following activities: development of treatment plan with patient and/or surrogate as well as nursing, discussions with consultants, evaluation of patient's response to treatment, examination of patient, obtaining history from patient or surrogate, ordering and performing treatments and interventions, ordering and review of laboratory studies, ordering and review of radiographic studies, pulse oximetry and re-evaluation of patient's condition.    Gerhard Munchobert Kelechi Orgeron, MD 01/15/14 0001

## 2014-01-12 NOTE — Sedation Documentation (Signed)
Pt shocked at 200 joules

## 2014-01-12 NOTE — ED Notes (Signed)
Patient transported to X-ray 

## 2014-01-12 NOTE — Discharge Instructions (Signed)
Atrial Fibrillation °Atrial fibrillation is a type of irregular heart rhythm (arrhythmia). During atrial fibrillation, the upper chambers of the heart (atria) quiver continuously in a chaotic pattern. This causes an irregular and often rapid heart rate.  °Atrial fibrillation is the result of the heart becoming overloaded with disorganized signals that tell it to beat. These signals are normally released one at a time by a part of the right atrium called the sinoatrial node. They then travel from the atria to the lower chambers of the heart (ventricles), causing the atria and ventricles to contract and pump blood as they pass. In atrial fibrillation, parts of the atria outside of the sinoatrial node also release these signals. This results in two problems. First, the atria receive so many signals that they do not have time to fully contract. Second, the ventricles, which can only receive one signal at a time, beat irregularly and out of rhythm with the atria.  °There are three types of atrial fibrillation:  °· Paroxysmal. Paroxysmal atrial fibrillation starts suddenly and stops on its own within a week. °· Persistent. Persistent atrial fibrillation lasts for more than a week. It may stop on its own or with treatment. °· Permanent. Permanent atrial fibrillation does not go away. Episodes of atrial fibrillation may lead to permanent atrial fibrillation. °Atrial fibrillation can prevent your heart from pumping blood normally. It increases your risk of stroke and can lead to heart failure.  °CAUSES  °· Heart conditions, including a heart attack, heart failure, coronary artery disease, and heart valve conditions.   °· Inflammation of the sac that surrounds the heart (pericarditis). °· Blockage of an artery in the lungs (pulmonary embolism). °· Pneumonia or other infections. °· Chronic lung disease. °· Thyroid problems, especially if the thyroid is overactive (hyperthyroidism). °· Caffeine, excessive alcohol use, and use  of some illegal drugs.   °· Use of some medicines, including certain decongestants and diet pills. °· Heart surgery.   °· Birth defects.   °Sometimes, no cause can be found. When this happens, the atrial fibrillation is called lone atrial fibrillation. The risk of complications from atrial fibrillation increases if you have lone atrial fibrillation and you are age 60 years or older. °RISK FACTORS °· Heart failure. °· Coronary artery disease. °· Diabetes mellitus.   °· High blood pressure (hypertension).   °· Obesity.   °· Other arrhythmias.   °· Increased age. °SIGNS AND SYMPTOMS  °· A feeling that your heart is beating rapidly or irregularly.   °· A feeling of discomfort or pain in your chest.   °· Shortness of breath.   °· Sudden light-headedness or weakness.   °· Getting tired easily when exercising.   °· Urinating more often than normal (mainly when atrial fibrillation first begins).   °In paroxysmal atrial fibrillation, symptoms may start and suddenly stop. °DIAGNOSIS  °Your health care provider may be able to detect atrial fibrillation when taking your pulse. Your health care provider may have you take a test called an ambulatory electrocardiogram (ECG). An ECG records your heartbeat patterns over a 24-hour period. You may also have other tests, such as: °· Transthoracic echocardiogram (TTE). During echocardiography, sound waves are used to evaluate how blood flows through your heart. °· Transesophageal echocardiogram (TEE). °· Stress test. There is more than one type of stress test. If a stress test is needed, ask your health care provider about which type is best for you. °· Chest X-ray exam. °· Blood tests. °· Computed tomography (CT). °TREATMENT  °Treatment may include: °· Treating any underlying conditions. For example, if you   have an overactive thyroid, treating the condition may correct atrial fibrillation. °· Taking medicine. Medicines may be given to control a rapid heart rate or to prevent blood  clots, heart failure, or a stroke. °· Having a procedure to correct the rhythm of the heart: °¨ Electrical cardioversion. During electrical cardioversion, a controlled, low-energy shock is delivered to the heart through your skin. If you have chest pain, very low blood pressure, or sudden heart failure, this procedure may need to be done as an emergency. °¨ Catheter ablation. During this procedure, heart tissues that send the signals that cause atrial fibrillation are destroyed. °¨ Surgical ablation. During this surgery, thin lines of heart tissue that carry the abnormal signals are destroyed. This procedure can either be an open-heart surgery or a minimally invasive surgery. With the minimally invasive surgery, small cuts are made to access the heart instead of a large opening. °¨ Pulmonary venous isolation. During this surgery, tissue around the veins that carry blood from the lungs (pulmonary veins) is destroyed. This tissue is thought to carry the abnormal signals. °HOME CARE INSTRUCTIONS  °· Take medicines only as directed by your health care provider. Some medicines can make atrial fibrillation worse or recur. °· If blood thinners were prescribed by your health care provider, take them exactly as directed. Too much blood-thinning medicine can cause bleeding. If you take too little, you will not have the needed protection against stroke and other problems. °· Perform blood tests at home if directed by your health care provider. Perform blood tests exactly as directed. °· Quit smoking if you smoke. °· Do not drink alcohol. °· Do not drink caffeinated beverages such as coffee, soda, and some teas. You may drink decaffeinated coffee, soda, or tea.   °· Maintain a healthy weight. Do not use diet pills unless your health care provider approves. They may make heart problems worse.   °· Follow diet instructions as directed by your health care provider. °· Exercise regularly as directed by your health care  provider. °· Keep all follow-up visits as directed by your health care provider. This is important. °PREVENTION  °The following substances can cause atrial fibrillation to recur:  °· Caffeinated beverages. °· Alcohol. °· Certain medicines, especially those used for breathing problems. °· Certain herbs and herbal medicines, such as those containing ephedra or ginseng. °· Illegal drugs, such as cocaine and amphetamines. °Sometimes medicines are given to prevent atrial fibrillation from recurring. Proper treatment of any underlying condition is also important in helping prevent recurrence.  °SEEK MEDICAL CARE IF: °· You notice a change in the rate, rhythm, or strength of your heartbeat. °· You suddenly begin urinating more frequently. °· You tire more easily when exerting yourself or exercising. °SEEK IMMEDIATE MEDICAL CARE IF:  °· You have chest pain, abdominal pain, sweating, or weakness. °· You feel nauseous. °· You have shortness of breath. °· You suddenly have swollen feet and ankles. °· You feel dizzy. °· Your face or limbs feel numb or weak. °· You have a change in your vision or speech. °MAKE SURE YOU:  °· Understand these instructions. °· Will watch your condition. °· Will get help right away if you are not doing well or get worse. °Document Released: 03/07/2005 Document Revised: 07/22/2013 Document Reviewed: 04/17/2012 °[employer]® Patient Information ©2015 [employer]. This information is not intended to replace advice given to you by your health care provider. Make sure you discuss any questions you have with your health care provider. ° °Anticoagulation, Generic °Anticoagulants are medicines   used to prevent clots from developing in your veins. These medicine are also known as blood thinners. If blood clots are untreated, they could travel to your lungs. This is called a pulmonary embolus. A blood clot in your lungs can be fatal.  °Health care providers often use anticoagulants to prevent clots following  surgery. Anticoagulants are also used along with aspirin when the heart is not getting enough blood. °Another anticoagulant called warfarin is started 2 to 3 days after a rapid-acting injectable anticoagulant is started. The rapid-acting anticoagulants are usually continued until warfarin has begun to work. Your health care provider will judge this length of time by blood tests known as the prothrombin time (PT) and International Normalization Ratio (INR). This means that your blood is at the necessary and best level to prevent clots. °RISKS AND COMPLICATIONS °· If you have received recent epidural anesthesia, spinal anesthesia, or a spinal tap while receiving anticoagulants, you are at risk for developing a blood clot in or around the spine. This condition could result in long-term or permanent paralysis. °· Because anticoagulants thin your blood, severe bleeding may occur from any tissue or organ. Symptoms of the blood being too thin may include: °¨ Bleeding from the nose or gums that does not stop quickly. °¨ Blood in bowel movements which may appear as bright red, dark, or black tarry stools. °¨ Blood in the urine which may appear as pink, red, or brown urine. °¨ Unusual bruising or bruising easily. °¨ A cut that does not stop bleeding within 10 minutes. °¨ Vomiting blood or continuous nausea for more than 1 day. °¨ Coughing up blood. °¨ Broken blood vessels in your eye (subconjunctival hemorrhage). °¨ Abdominal or back pain with or without flank bruising. °¨ Sudden, severe headache. °¨ Sudden weakness or numbness of the face, arm, or leg, especially on one side of the body. °¨ Sudden confusion. °¨ Trouble speaking (aphasia) or understanding. °¨ Sudden trouble seeing in one or both eyes. °¨ Sudden trouble walking. °¨ Dizziness. °¨ Loss of balance or coordination. °¨ Vaginal bleeding. °¨ Swelling or pain at an injection site. °¨ Superficial fat tissue death (necrosis) which may cause skin scarring. This is more  common in women and may first present as pain in the waist, thighs, or buttocks. °¨ Fever. °· Too little anticoagulation continues to allow the risk for blood clots. °HOME CARE INSTRUCTIONS  °· Due to the complications of anticoagulants, it is very important that you take your anticoagulant as directed by your health care provider. Anticoagulants need to be taken exactly as instructed. Be sure you understand all your anticoagulant instructions. °· Keep all follow-up appointments with your health care provider as directed. It is very important to keep your appointments. Not keeping appointments could result in a chronic or permanent injury, pain, or disability. °· Warfarin. Your health care provider will advise you on the length of treatment (usually 3-6 months, sometimes lifelong). °¨ Take warfarin exactly as directed by your health care provider. It is recommended that you take your warfarin dose at the same time of the day. It is preferred that you take warfarin in the late afternoon. If you have been told to stop taking warfarin, do not resume taking warfarin until directed to do so by your health care provider. Follow your health care provider's instructions if you accidentally take an extra dose or miss a dose of warfarin. It is very important to take warfarin as directed since bleeding or blood clots could result in   chronic or permanent injury, pain, or disability. °¨ Too much and too little warfarin are both dangerous. Too much warfarin increases the risk of bleeding. Too little warfarin continues to allow the risk for blood clots. While taking warfarin, you will need to have regular blood tests to measure your blood clotting time. These blood tests usually include both the prothrombin time (PT) and International Normalized Ratio (INR) tests. The PT and INR results allow your health care provider to adjust your dose of warfarin. The dose can change for many reasons. It is critically important that you have  your PT and INR levels drawn exactly as directed. Your warfarin dose may stay the same or change depending on what the PT and INR results are. Be sure to follow up with your health care provider regarding your PT and INR test results and what your warfarin dosage should be. °¨ Many medicines can interfere with warfarin and affect the PT and INR results. You must tell your health care provider about any and all medicines you take, this includes all vitamins and supplements. Ask your health care provider before taking these. Prescription and over-the-counter medicine consistency is critical to warfarin management. It is important that potential interactions are checked before you start a new medicine. Be especially cautious with aspirin and anti-inflammatory medicines. Ask your health care provider before taking these. Medicines such as antibiotics and acid-reducing medicine can interact with warfarin and can cause an increased warfarin effect. Warfarin can also interfere with the effectiveness of medicines you are taking. Do not take or discontinue any prescribed or over-the-counter medicine except on the advice of your health care provider or pharmacist. °¨ Some vitamins, supplements, and herbal products interfere with the effectiveness of warfarin. Vitamin E may increase the anticoagulant effects of warfarin. Vitamin K may can cause warfarin to be less effective. Do not take or discontinue any vitamin, supplement, or herbal product except on the advice of your health care provider or pharmacist. °¨ Eat what you normally eat and keep the vitamin K content of your diet consistent. Avoid major changes in your diet, or notify your health care provider before changing your diet. Suddenly getting a lot more vitamin K could cause your blood to clot too quickly. A sudden decrease in vitamin K intake could cause your blood to clot too slowly. These changes in vitamin K intake could lead to dangerous blood clots or to  bleeding. To keep your vitamin K intake consistent, you must be aware of which foods contain moderate or high amounts of vitamin K. Some foods high in vitamin K include spinach, kale, broccoli, cabbage, greens, Brussels sprouts, asparagus, [place], coleslaw, parsley, and green tea. Arrange a visit with a dietitian to answer your questions. °¨ If you have a loss of appetite or get the stomach flu (viral gastroenteritis), talk to your health care provider as soon as possible. A decrease in your normal vitamin K intake can make you more sensitive to your usual dose of warfarin. °¨ Some medical conditions may increase your risk for bleeding while you are taking warfarin. A fever, diarrhea lasting more than a day, worsening heart failure, or worsening liver function are some medical conditions that could affect warfarin. Contact your health care provider if you have any of these medical conditions. °¨ Alcohol can change the body's ability to handle warfarin. It is best to avoid alcoholic drinks or consume only very small amounts while taking warfarin. Notify your health care provider if you change your alcohol   intake. A sudden increase in alcohol use can increase your risk of bleeding. Chronic alcohol use can cause warfarin to be less effective. °· Be careful not to cut yourself when using sharp objects or while shaving. °· Inform all your health care providers and your dentist that you take an anticoagulant. °· Limit physical activities or sports that could result in a fall or cause injury. Avoid contact sports. °· Wear medical alert jewelry or carry a medical alert card. °SEEK IMMEDIATE MEDICAL CARE IF: °· You cough up blood. °· You have dark or black stools or there is bright red blood coming from your rectum. °· You vomit blood or have nausea for more than 1 day. °· You have blood in the urine or pink colored urine. °· You have unusual bruising or have increased bruising. °· You have bleeding from the nose or gums  that does not stop quickly. °· You have a cut that does not stop bleeding within a 2-3 minutes. °· You have sudden weakness or numbness of the face, arm, or leg, especially on one side of the body. °· You have sudden confusion. °· You have trouble speaking (aphasia) or understanding. °· You have sudden trouble seeing in one or both eyes. °· You have sudden trouble walking. °· You have dizziness. °· You have a loss of balance or coordination. °· You have a sudden, severe headache. °· You have a serious fall or head injury, even if you are not bleeding. °· You have swelling or pain at an injection site. °· You have unexplained tenderness or pain in the abdomen, back, waist, thighs or buttocks. °· You have a fever. °Any of these symptoms may represent a serious problem that is an emergency. Do not wait to see if the symptoms will go away. Get medical help right away. Call your local emergency services (911 in [place] Do not drive yourself to the hospital. °Document Released: 03/07/2005 Document Revised: 03/12/2013 Document Reviewed: 10/10/2007 °[employer]® Patient Information ©2015 [employer]. This information is not intended to replace advice given to you by your health care provider. Make sure you discuss any questions you have with your health care provider. ° °

## 2014-01-12 NOTE — ED Notes (Signed)
MD at bedside. 

## 2014-01-12 NOTE — ED Notes (Addendum)
Reports fast HR, noticed when beginning home HD at 0415. HR 140. (Denies: pain, sob or other sx). Alert, NAD, calm. Pt in and out of fast rhythm, 57-140

## 2014-01-12 NOTE — ED Provider Notes (Signed)
Patient evaluated by cardiology and due to less than 24 hours symptoms feel that he can be cardioverted back to sinus rhythm. Patient was last cardioverted in April. She is awake alert and in no acute distress. Cardizem was turned off and we'll re-evaluate blood pressure which is 90/50 at this time. As long his blood pressure remains stable will do a cardioversion with propofol.  NPO time is >8 hours  Procedural sedation Performed by: Gwyneth Sprout Consent: Verbal consent obtained. Risks and benefits: risks, benefits and alternatives were discussed Required items: required blood products, implants, devices, and special equipment available Patient identity confirmed: arm band and provided demographic data Time out: Immediately prior to procedure a "time out" was called to verify the correct patient, procedure, equipment, support staff and site/side marked as required.  Sedation type: moderate (conscious) sedation NPO time confirmed and considedered  Sedatives: PROPOFOL  Physician Time at Bedside: 30  Vitals: Vital signs were monitored during sedation. Cardiac Monitor, pulse oximeter Patient tolerance: Patient tolerated the procedure well with no immediate complications. Comments: Pt with uneventful recovered. Returned to pre-procedural sedation baseline   10:13 AM\ Patient tolerated sedation well and is awake and alert and has no complaints. Cardioversion resolved patient's atrial fibrillation he is now in sinus rhythm in the 70s. Patient will be started on Coumadin 5 mg daily until he can follow-up with his cardiologist in [place], [place] 01/12/14 1014

## 2014-01-12 NOTE — ED Notes (Signed)
Per EDP stop cardizem. Prepare for cardioversion.

## 2014-01-12 NOTE — H&P (Addendum)
Cardiologist: Dr Tedra Senegal is an 53 y.o. male.   Chief Complaint:  Rapid HR HPI:   Patient is a 53 year old male with a history of atrial fibrillation/flutter, pulmonary hypertension, obesity hypoventilation syndrome, hypertension, hypotension, anemia, congestive heart failure, moderate mitral valve regurgitation, end-stage renal disease on home dialysis.  His cardiologist is Dr. Mercie Eon at [facility].  His last 2-D echocardiogram was 07/11/2013 his ejection fraction is 25-30% with diffuse hypokinesis. Moderate mitral regurgitation. Left atrium moderately dilated. Mild aortic valve regurgitation. Right ventricle moderately dilated. Right atrium mild to moderately dilated. Tricuspid valve shows moderate to severe regurgitation. PA pressure 41 mmHg. He was hospitalized back in April 2015 with atrial flutter and rapid ventricular response. At that time we did a TEE and he was cardioverted on April 23.  He was started on Coumadin.  Also during the last admission it was recommended he have a sleep study.  After reading previous notes sounds as though compliance may be an issue, however, after talking to the patient sounds as though he's been trying real hard to make improvements.  Patient presented in atrial flutter with rapid ventricular response. He stated he was checking his blood pressure this morning and noticed his heart rate was elevated.  Yesterday it was not.  He says he was not symptomatic reason he came in was because he saw that his heart rate was elevated.  The patient currently denies nausea(has periodically), vomiting, fever, chest pain, shortness of breath, orthopnea, dizziness, PND, cough, congestion, abdominal pain, hematochezia, melena, lower extremity edema, claudication.  He was recently treated for bronchitis with antibiotics.  He only took Coumadin until about May and then stopped it because of some problem with dialysis.  He was just seen by his  cardiologist at [facility] who released him for 1 year. He did have another 2-D echocardiogram at [facility] and she apparently was happy because his ejection fraction had improved to 45%.   Medications: Prior to Admission medications   Medication Sig Start Date End Date Taking? Authorizing Provider  albuterol (PROVENTIL HFA;VENTOLIN HFA) 108 (90 BASE) MCG/ACT inhaler Inhale 1 puff into the lungs every 6 (six) hours as needed for wheezing or shortness of breath.   Yes Historical Provider, MD  calcitRIOL (ROCALTROL) 0.5 MCG capsule Take 0.5 mcg by mouth daily.   Yes Historical Provider, MD  clonazePAM (KLONOPIN) 0.5 MG tablet Take 0.5 mg by mouth 2 (two) times daily as needed for anxiety.    Yes Historical Provider, MD  lanthanum (FOSRENOL) 1000 MG chewable tablet Chew 1,000-2,000 mg by mouth 3 (three) times daily with meals. Takes 2 tablets with meals and 1 tablet with snacks.   Yes Historical Provider, MD  omeprazole (PRILOSEC) 10 MG capsule Take 10 mg by mouth daily.   Yes Historical Provider, MD  oxyCODONE-acetaminophen (PERCOCET/ROXICET) 5-325 MG per tablet Take 1-2 tablets by mouth every 4 (four) hours as needed (severe pain). 07/16/12  Yes Domenic Polite, MD  ursodiol (ACTIGALL) 300 MG capsule Take 1 capsule (300 mg total) by mouth 3 (three) times daily. 07/22/13  Yes Stark Klein, MD     Past Medical History  Diagnosis Date  . ESRD on hemodialysis     Started dialysis with PD in 2013 and switched to HD in spring 2014 because of fungal peritonitis.  Takes dialysis at home 5 days a week approx 3h 15 min each session.     . CHF (congestive heart failure)   . Peritoneal dialysis catheter  in place   . Anemia     patient reporats that last hgb 7.9 a week ago  . Anxiety state, unspecified   . Hypertension     Past Surgical History  Procedure Laterality Date  . Appendectomy    . Peritoneal catheter insertion    . Tee without cardioversion N/A 07/11/2013    Procedure: TRANSESOPHAGEAL ECHOCARDIOGRAM  (TEE);  Surgeon: Fay Records, MD;  Location: [place];  Service: Cardiovascular;  Laterality: N/A;  . Cardioversion N/A 07/11/2013    Procedure: CARDIOVERSION;  Surgeon: Fay Records, MD;  Location[facility] ENDOSCOPY;  Service: Cardiovascular;  Laterality: N/A;    Family History  Problem Relation Age of Onset  . Diabetes Mother   . Hypertension Mother   . Hypertension Sister   . Asthma Sister    Social History:  reports that he has never smoked. He has never used smokeless tobacco. He reports that he does not drink alcohol or use illicit drugs.  Allergies:  Allergies  Allergen Reactions  . Renvela [Sevelamer] Nausea And Vomiting     (Not in a hospital admission)  Results for orders placed during the hospital encounter of 01/12/14 (from the past 48 hour(s))  I-STAT CHEM 8, ED     Status: Abnormal   Collection Time    01/12/14  5:48 AM      Result Value Ref Range   Sodium 141  137 - 147 mEq/L   Potassium 4.3  3.7 - 5.3 mEq/L   Chloride 105  96 - 112 mEq/L   BUN 79 (*) 6 - 23 mg/dL   Creatinine, Ser 17.90 (*) 0.50 - 1.35 mg/dL   Glucose, Bld 104 (*) 70 - 99 mg/dL   Calcium, Ion 1.02 (*) 1.12 - 1.23 mmol/L   TCO2 22  0 - 100 mmol/L   Hemoglobin 13.9  13.0 - 17.0 g/dL   HCT 41.0  39.0 - 52.0 %  CBC WITH DIFFERENTIAL     Status: Abnormal   Collection Time    01/12/14  6:16 AM      Result Value Ref Range   WBC 7.6  4.0 - 10.5 K/uL   RBC 4.01 (*) 4.22 - 5.81 MIL/uL   Hemoglobin 12.1 (*) 13.0 - 17.0 g/dL   HCT 38.3 (*) 39.0 - 52.0 %   MCV 95.5  78.0 - 100.0 fL   MCH 30.2  26.0 - 34.0 pg   MCHC 31.6  30.0 - 36.0 g/dL   RDW 14.8  11.5 - 15.5 %   Platelets 128 (*) 150 - 400 K/uL   Neutrophils Relative % 62  43 - 77 %   Neutro Abs 4.7  1.7 - 7.7 K/uL   Lymphocytes Relative 26  12 - 46 %   Lymphs Abs 2.0  0.7 - 4.0 K/uL   Monocytes Relative 8  3 - 12 %   Monocytes Absolute 0.6  0.1 - 1.0 K/uL   Eosinophils Relative 4  0 - 5 %   Eosinophils Absolute 0.3  0.0 - 0.7 K/uL    Basophils Relative 0  0 - 1 %   Basophils Absolute 0.0  0.0 - 0.1 K/uL  COMPREHENSIVE METABOLIC PANEL     Status: Abnormal   Collection Time    01/12/14  6:16 AM      Result Value Ref Range   Sodium 143  137 - 147 mEq/L   Potassium 4.6  3.7 - 5.3 mEq/L   Chloride 98  96 - 112  mEq/L   CO2 21  19 - 32 mEq/L   Glucose, Bld 100 (*) 70 - 99 mg/dL   BUN 76 (*) 6 - 23 mg/dL   Creatinine, Ser 17.51 (*) 0.50 - 1.35 mg/dL   Calcium 8.7  8.4 - 10.5 mg/dL   Total Protein 7.8  6.0 - 8.3 g/dL   Albumin 3.5  3.5 - 5.2 g/dL   AST 17  0 - 37 U/L   Comment: HEMOLYSIS AT THIS LEVEL MAY AFFECT RESULT   ALT 10  0 - 53 U/L   Alkaline Phosphatase 59  39 - 117 U/L   Total Bilirubin 0.4  0.3 - 1.2 mg/dL   GFR calc non Af Amer 3 (*) >90 mL/min   GFR calc Af Amer 3 (*) >90 mL/min   Comment: (NOTE)     The eGFR has been calculated using the CKD EPI equation.     This calculation has not been validated in all clinical situations.     eGFR's persistently <90 mL/min signify possible Chronic Kidney     Disease.   Anion gap 24 (*) 5 - 15  TROPONIN I     Status: None   Collection Time    01/12/14  6:16 AM      Result Value Ref Range   Troponin I <0.30  <0.30 ng/mL   Comment:            Due to the release kinetics of cTnI,     a negative result within the first hours     of the onset of symptoms does not rule out     myocardial infarction with certainty.     If myocardial infarction is still suspected,     repeat the test at appropriate intervals.  PROTIME-INR     Status: None   Collection Time    01/12/14  6:16 AM      Result Value Ref Range   Prothrombin Time 13.8  11.6 - 15.2 seconds   INR 1.05  0.00 - 1.49   Dg Chest 2 View  01/12/2014   CLINICAL DATA:  Initial evaluation for tachycardia. History of hypertension.  EXAM: CHEST  2 VIEW  COMPARISON:  Prior study from 07/10/2013  FINDINGS: Cardiomegaly is stable. Mediastinal silhouette within normal limits.  The lungs are normally inflated. No  airspace consolidation, pleural effusion, or pulmonary edema is identified. There is no pneumothorax.  No acute osseous abnormality identified.  IMPRESSION: 1. No active cardiopulmonary disease. 2. Stable cardiomegaly.   Electronically Signed   By: Jeannine Boga M.D.   On: 01/12/2014 06:56    Review of Systems  Constitutional: Negative for fever and diaphoresis.  HENT: Positive for congestion (recently treated for bronchitis).   Respiratory: Positive for cough. Negative for shortness of breath and wheezing.   Cardiovascular: Negative for chest pain, palpitations, orthopnea, leg swelling and PND.  Gastrointestinal: Negative for nausea, vomiting, abdominal pain, blood in stool and melena.  Musculoskeletal: Negative for myalgias.  Neurological: Negative for dizziness.  All other systems reviewed and are negative.   Blood pressure 84/65, pulse 125, temperature 98.1 F (36.7 C), temperature source Oral, resp. rate 29, SpO2 97.00%. Physical Exam  Nursing note and vitals reviewed. Constitutional: He is oriented to person, place, and time. He appears well-developed. No distress.  Obese  HENT:  Head: Normocephalic and atraumatic.  Mouth/Throat: No oropharyngeal exudate.  Eyes: EOM are normal. Pupils are equal, round, and reactive to light.  Neck: Normal range of motion.  Neck supple.  Cardiovascular: S1 normal and S2 normal.  An irregularly irregular rhythm present. Tachycardia present.   No murmur heard. Pulses:      Radial pulses are 2+ on the right side, and 2+ on the left side.       Dorsalis pedis pulses are 2+ on the right side, and 2+ on the left side.  Respiratory: Effort normal and breath sounds normal. He has no wheezes. He has no rales.  GI: Soft. Bowel sounds are normal. He exhibits no distension. There is no tenderness.  Musculoskeletal: He exhibits no edema.  Lymphadenopathy:    He has no cervical adenopathy.  Neurological: He is alert and oriented to person, place, and  time. He exhibits normal muscle tone.  Skin: Skin is warm and dry.  Psychiatric: He has a normal mood and affect.     Assessment/Plan Principal Problem:   Atrial flutter with RVR Active Problems:   End stage renal disease-  on HD   Mitral regurgitation (MODERATE)   Obesity (BMI 30-39.9)   Obesity hypoventilation syndrome   Hypotension   Pulmonary hypertension   Moderate to Severe TR  Plan:   53 year old male with a history of atrial fibrillation/flutter(New in April 2015), pulmonary hypertension, obesity hypoventilation syndrome, hypertension, hypotension, anemia, congestive heart failure, moderate mitral valve regurgitation, end-stage renal disease on home dialysis.  According to the patient, his last 2-D echo cardiogram showed improvement with ejection fraction 45%.  He was on bisoprolol however, that was discontinued because of hypotension.  He presents with atrial flutter with rapid ventricular response, which started [place] in 48 hours ago.  He is currently on Cardizem IV 5 mg an hour.  He is however, hypotensive.  A 1 L normal saline bolus is currently running.  No signs or symptoms of acute CHF.  CXR clear.  He is no longer taking Coumadin as of May 2015 due to problems with HD(he could not elaborate).  The patient has not eaten anything since yesterday.  Plan on cardioversion this morning.     Tarri Fuller, [place] 01/12/2014, 8:41 AM  I have seen, examined the patient, and reviewed the above assessment and plan.  Changes to above are made where necessary.  The patient has been in an atrial flutter for < 48 hours.  He has mild hemodynamic instability with this with low BP limiting our ability to rate control.  As he is clear that he has been in this rhythm for less than 24 hours due to aggressive monitoring of his own vitals at home, I would advise cardioversion at this time.  Risks, benefits, and alternatives to cardioversion were discussed at length with the patient who wishes to proceed.   He is aware that risks including but are not limited to sedation complications, stroke, MI, and death.  The patient accepts these risks and would like to proceed.  I will therefore discuss this with the ED team and proceed if they are willing to provide sedation.  He will need to start coumadin and return to see his primary cardiologist for follow-up in the next 3-5 days.  If we can establish sinus rhythm and he is without symptoms, perhaps we could discharge from the ED later today.  Co Sign: Thompson Grayer, MD 01/12/2014 9:22 AM   EP procedure Note   Pre procedure Diagnosis:  Persistent Atrial Flutter Post procedure Diagnosis:  Same  Procedures:  Electrical cardioversion  Description:  Informed, written consent was obtained for cardioversion.  Adequate IV acces  and airway support were assured.  The patient was adequately sedated with intravenous propofol as outlined in the ED physician report.  The patient presented today in atrial fibrillation.  He was successfully cardioverted to sinus rhythm with a single synchronized biphasic 200J shock delivered with cardioversion electrodes placed in the anterior/posterior configuration.  He remains in sinus rhythm thereafter. EBL 9m. There were no early apparent complications.  Conclusions:  1.  Successful cardioversion of atrial flutter to sinus rhythm 2.  No early apparent complications.  Plan: Resume coumadin 549mdaily.  Will [facility] I have instructed him to call his primary cardiologist to arrange follow-up in 3-5 days.  He will contact me if problems arise in the interim.  Konnie Noffsinger,MD 10:16 AM 01/12/2014

## 2014-04-20 ENCOUNTER — Encounter (HOSPITAL_COMMUNITY): Payer: Self-pay | Admitting: Physical Medicine and Rehabilitation

## 2014-04-20 ENCOUNTER — Emergency Department (HOSPITAL_COMMUNITY): Payer: Medicare Other

## 2014-04-20 ENCOUNTER — Emergency Department (HOSPITAL_COMMUNITY)
Admission: EM | Admit: 2014-04-20 | Discharge: 2014-04-20 | Disposition: A | Discharge status: Home / Self Care | Payer: Medicare Other | Attending: Emergency Medicine | Admitting: Emergency Medicine

## 2014-04-20 DIAGNOSIS — N186 End stage renal disease: Secondary | ICD-10-CM | POA: Diagnosis not present

## 2014-04-20 DIAGNOSIS — M1711 Unilateral primary osteoarthritis, right knee: Secondary | ICD-10-CM | POA: Insufficient documentation

## 2014-04-20 DIAGNOSIS — F419 Anxiety disorder, unspecified: Secondary | ICD-10-CM | POA: Insufficient documentation

## 2014-04-20 DIAGNOSIS — R635 Abnormal weight gain: Secondary | ICD-10-CM | POA: Diagnosis not present

## 2014-04-20 DIAGNOSIS — Z79899 Other long term (current) drug therapy: Secondary | ICD-10-CM | POA: Diagnosis not present

## 2014-04-20 DIAGNOSIS — I509 Heart failure, unspecified: Secondary | ICD-10-CM | POA: Diagnosis not present

## 2014-04-20 DIAGNOSIS — Z992 Dependence on renal dialysis: Secondary | ICD-10-CM | POA: Diagnosis not present

## 2014-04-20 DIAGNOSIS — I120 Hypertensive chronic kidney disease with stage 5 chronic kidney disease or end stage renal disease: Secondary | ICD-10-CM | POA: Diagnosis not present

## 2014-04-20 DIAGNOSIS — M25512 Pain in left shoulder: Secondary | ICD-10-CM | POA: Diagnosis not present

## 2014-04-20 DIAGNOSIS — M25561 Pain in right knee: Secondary | ICD-10-CM | POA: Diagnosis present

## 2014-04-20 DIAGNOSIS — M7989 Other specified soft tissue disorders: Secondary | ICD-10-CM | POA: Insufficient documentation

## 2014-04-20 DIAGNOSIS — M67912 Unspecified disorder of synovium and tendon, left shoulder: Secondary | ICD-10-CM

## 2014-04-20 DIAGNOSIS — Z862 Personal history of diseases of the blood and blood-forming organs and certain disorders involving the immune mechanism: Secondary | ICD-10-CM | POA: Diagnosis not present

## 2014-04-20 DIAGNOSIS — M2540 Effusion, unspecified joint: Secondary | ICD-10-CM

## 2014-04-20 MED ORDER — OXYCODONE-ACETAMINOPHEN 5-325 MG PO TABS: 1.0000 | ORAL_TABLET | ORAL | Status: DC | PRN

## 2014-04-20 NOTE — ED Notes (Signed)
Patient transported to X-ray 

## 2014-04-20 NOTE — ED Notes (Signed)
Pt presents to department for evaluation of R leg/knee pain, also states L shoulder pain. Reports difficulty walking today due to pain. 10/10 pain upon arrival. Hemodialysis patient, receives treatment x5 days a week. 10/10 pain upon arrival to ED. Pt is alert and oriented x4.

## 2014-04-20 NOTE — ED Provider Notes (Signed)
CSN: [id number]     Arrival date & time 04/20/14  1243 History   First MD Initiated Contact with Patient 04/20/14 1538     Chief Complaint  Patient presents with  . Pain      HPI Patient presents with right knee pain for the last 4-5 days.  No history of recent trauma.  Pain associated with some swelling.  Similar incident before when he gained a lot of weight.  Patient has had some recent weight gain.  Also complains of pain to the left shoulder with movement.  Patient denies fever or chills. Past Medical History  Diagnosis Date  . ESRD on hemodialysis     Started dialysis with PD in 2013 and switched to HD in spring 2014 because of fungal peritonitis.  Takes dialysis at home 5 days a week approx 3h 15 min each session.     . CHF (congestive heart failure)   . Peritoneal dialysis catheter in place   . Anemia     patient reporats that last hgb 7.9 a week ago  . Anxiety state, unspecified   . Hypertension    Past Surgical History  Procedure Laterality Date  . Appendectomy    . Peritoneal catheter insertion    . Tee without cardioversion N/A 07/11/2013    Procedure: TRANSESOPHAGEAL ECHOCARDIOGRAM (TEE);  Surgeon: Pricilla Riffle, MD;  Location: [facility] ENDOSCOPY;  Service: Cardiovascular;  Laterality: N/A;  . Cardioversion N/A 07/11/2013    Procedure: CARDIOVERSION;  Surgeon: Pricilla Riffle, MD;  Location: [facility] ENDOSCOPY;  Service: Cardiovascular;  Laterality: N/A;   Family History  Problem Relation Age of Onset  . Diabetes Mother   . Hypertension Mother   . Hypertension Sister   . Asthma Sister    History  Substance Use Topics  . Smoking status: Never Smoker   . Smokeless tobacco: Never Used  . Alcohol Use: No    Review of Systems  Constitutional: Positive for activity change and unexpected weight change (Has gained weight recently). Negative for fever and chills.  All other systems reviewed and are negative.     Allergies  Renvela  Home Medications   Prior to Admission  medications   Medication Sig Start Date End Date Taking? Authorizing Provider  albuterol (PROVENTIL HFA;VENTOLIN HFA) 108 (90 BASE) MCG/ACT inhaler Inhale 1 puff into the lungs every 6 (six) hours as needed for wheezing or shortness of breath.    Historical Provider, MD  calcitRIOL (ROCALTROL) 0.5 MCG capsule Take 0.5 mcg by mouth daily.    Historical Provider, MD  clonazePAM (KLONOPIN) 0.5 MG tablet Take 0.5 mg by mouth 2 (two) times daily as needed for anxiety.     Historical Provider, MD  lanthanum (FOSRENOL) 1000 MG chewable tablet Chew 1,000-2,000 mg by mouth 3 (three) times daily with meals. Takes 2 tablets with meals and 1 tablet with snacks.    Historical Provider, MD  omeprazole (PRILOSEC) 10 MG capsule Take 10 mg by mouth daily.    Historical Provider, MD  oxyCODONE-acetaminophen (PERCOCET/ROXICET) 5-325 MG per tablet Take 1-2 tablets by mouth every 4 (four) hours as needed (severe pain). 04/20/14   Nelia Shi, MD  ursodiol (ACTIGALL) 300 MG capsule Take 1 capsule (300 mg total) by mouth 3 (three) times daily. 07/22/13   Almond Lint, MD  warfarin (COUMADIN) 5 MG tablet Take 1 tablet (5 mg total) by mouth daily. 01/12/14   Gwyneth Sprout, MD   BP 126/62 mmHg  Pulse 83  Temp(Src) 98.1 F (  36.7 C) (Oral)  Resp 18  SpO2 95% Physical Exam  Constitutional: He is oriented to person, place, and time. He appears well-developed and well-nourished. No distress.  HENT:  Head: Normocephalic and atraumatic.  Eyes: Pupils are equal, round, and reactive to light.  Neck: Normal range of motion.  Cardiovascular: Normal rate and intact distal pulses.   Pulmonary/Chest: No respiratory distress.  Abdominal: Normal appearance. He exhibits no distension.  Musculoskeletal:       Left shoulder: He exhibits pain. He exhibits no swelling, no effusion and no crepitus.       Right knee: He exhibits decreased range of motion and swelling. He exhibits no deformity and no erythema.  Neurological: He is  alert and oriented to person, place, and time. No cranial nerve deficit.  Skin: Skin is warm and dry. No rash noted.  Psychiatric: He has a normal mood and affect. His behavior is normal.  Nursing note and vitals reviewed.   ED Course  Procedures (including critical care time) Labs Review Labs Reviewed - No data to display  Imaging Review Dg Shoulder Left  04/20/2014   CLINICAL DATA:  Left shoulder pain for 1 month. Trace that left shoulder pain for 1 month beginning after the patient began working out. No known injury.  EXAM: LEFT SHOULDER - 2+ VIEW  COMPARISON:  None.  FINDINGS: No acute bony or joint abnormality is identified. Small ossific fragment subjacent to the acromion is compatible with calcific tendinitis or subacromial/subdeltoid bursitis. Mild appearing acromioclavicular degenerative change is seen. Imaged left lung and ribs appear normal.  IMPRESSION: No acute abnormality.  Findings compatible with calcific rotator cuff tendinopathy or subacromial/subdeltoid bursitis.  Mild appearing acromioclavicular osteoarthritis.   Electronically Signed   By: Drusilla Kanner M.D.   On: 04/20/2014 16:41   Dg Knee Complete 4 Views Right  04/20/2014   CLINICAL DATA:  Right knee pain, swelling and tightness for 2 days. No known injury.  EXAM: RIGHT KNEE - COMPLETE 4+ VIEW  COMPARISON:  None.  FINDINGS: The patient has a moderate knee joint effusion. No fracture is identified. Mild medial compartment joint space narrowing is noted.  IMPRESSION: Negative for fracture.  Moderate joint effusion.  Mild medial compartment degenerative disease.   Electronically Signed   By: Drusilla Kanner M.D.   On: 04/20/2014 16:37      MDM  The patient is currently not taking Coumadin.  He will be instructed on elevation and rest.  Orthopedic referral is given.  He'll be treated with oxycodone for pain.  At this time there is no heat to the joint and I do not suspect a joint infection. Final diagnoses:   Osteoarthritis of right knee, unspecified osteoarthritis type  Disorder of left rotator cuff  Joint effusion        Nelia Shi, MD 04/20/14 1726

## 2014-04-20 NOTE — Discharge Instructions (Signed)
Arthritis, Nonspecific °Arthritis is inflammation of a joint. This usually means pain, redness, warmth or swelling are present. One or more joints may be involved. There are a number of types of arthritis. Your caregiver may not be able to tell what type of arthritis you have right away. °CAUSES  °The most common cause of arthritis is the wear and tear on the joint (osteoarthritis). This causes damage to the cartilage, which can break down over time. The knees, hips, back and neck are most often affected by this type of arthritis. °Other types of arthritis and common causes of joint pain include: °· Sprains and other injuries near the joint. Sometimes minor sprains and injuries cause pain and swelling that develop hours later. °· Rheumatoid arthritis. This affects hands, feet and knees. It usually affects both sides of your body at the same time. It is often associated with chronic ailments, fever, weight loss and general weakness. °· Crystal arthritis. Gout and pseudo gout can cause occasional acute severe pain, redness and swelling in the foot, ankle, or knee. °· Infectious arthritis. Bacteria can get into a joint through a break in overlying skin. This can cause infection of the joint. Bacteria and viruses can also spread through the blood and affect your joints. °· Drug, infectious and allergy reactions. Sometimes joints can become mildly painful and slightly swollen with these types of illnesses. °SYMPTOMS  °· Pain is the main symptom. °· Your joint or joints can also be red, swollen and warm or hot to the touch. °· You may have a fever with certain types of arthritis, or even feel overall ill. °· The joint with arthritis will hurt with movement. Stiffness is present with some types of arthritis. °DIAGNOSIS  °Your caregiver will suspect arthritis based on your description of your symptoms and on your exam. Testing may be needed to find the type of arthritis: °· Blood and sometimes urine tests. °· X-ray tests  and sometimes CT or MRI scans. °· Removal of fluid from the joint (arthrocentesis) is done to check for bacteria, crystals or other causes. Your caregiver (or a specialist) will numb the area over the joint with a local anesthetic, and use a needle to remove joint fluid for examination. This procedure is only minimally uncomfortable. °· Even with these tests, your caregiver may not be able to tell what kind of arthritis you have. Consultation with a specialist (rheumatologist) may be helpful. °TREATMENT  °Your caregiver will discuss with you treatment specific to your type of arthritis. If the specific type cannot be determined, then the following general recommendations may apply. °Treatment of severe joint pain includes: °· Rest. °· Elevation. °· Anti-inflammatory medication (for example, ibuprofen) may be prescribed. Avoiding activities that cause increased pain. °· Only take over-the-counter or prescription medicines for pain and discomfort as recommended by your caregiver. °· Cold packs over an inflamed joint may be used for 10 to 15 minutes every hour. Hot packs sometimes feel better, but do not use overnight. Do not use hot packs if you are diabetic without your caregiver's permission. °· A cortisone shot into arthritic joints may help reduce pain and swelling. °· Any acute arthritis that gets worse over the next 1 to 2 days needs to be looked at to be sure there is no joint infection. °Long-term arthritis treatment involves modifying activities and lifestyle to reduce joint stress jarring. This can include weight loss. Also, exercise is needed to nourish the joint cartilage and remove waste. This helps keep the muscles   around the joint strong. °HOME CARE INSTRUCTIONS  °· Do not take aspirin to relieve pain if gout is suspected. This elevates uric acid levels. °· Only take over-the-counter or prescription medicines for pain, discomfort or fever as directed by your caregiver. °· Rest the joint as much as  possible. °· If your joint is swollen, keep it elevated. °· Use crutches if the painful joint is in your leg. °· Drinking plenty of fluids may help for certain types of arthritis. °· Follow your caregiver's dietary instructions. °· Try low-impact exercise such as: °¨ Swimming. °¨ Water aerobics. °¨ Biking. °¨ Walking. °· Morning stiffness is often relieved by a warm shower. °· Put your joints through regular range-of-motion. °SEEK MEDICAL CARE IF:  °· You do not feel better in 24 hours or are getting worse. °· You have side effects to medications, or are not getting better with treatment. °SEEK IMMEDIATE MEDICAL CARE IF:  °· You have a fever. °· You develop severe joint pain, swelling or redness. °· Many joints are involved and become painful and swollen. °· There is severe back pain and/or leg weakness. °· You have loss of bowel or bladder control. °Document Released: 04/14/2004 Document Revised: 05/30/2011 Document Reviewed: 04/30/2008 °ExitCare® Patient Information ©2015 [employer]. This information is not intended to replace advice given to you by your health care provider. Make sure you discuss any questions you have with your health care provider. ° °

## 2014-05-11 ENCOUNTER — Encounter (HOSPITAL_COMMUNITY): Payer: Self-pay | Admitting: *Deleted

## 2014-05-11 ENCOUNTER — Emergency Department (HOSPITAL_COMMUNITY)
Admission: EM | Admit: 2014-05-11 | Discharge: 2014-05-11 | Discharge status: Against Medical Advice | Payer: Medicare Other | Attending: Emergency Medicine | Admitting: Emergency Medicine

## 2014-05-11 DIAGNOSIS — R509 Fever, unspecified: Secondary | ICD-10-CM | POA: Diagnosis not present

## 2014-05-11 DIAGNOSIS — I120 Hypertensive chronic kidney disease with stage 5 chronic kidney disease or end stage renal disease: Secondary | ICD-10-CM | POA: Insufficient documentation

## 2014-05-11 DIAGNOSIS — R05 Cough: Secondary | ICD-10-CM | POA: Diagnosis not present

## 2014-05-11 DIAGNOSIS — I509 Heart failure, unspecified: Secondary | ICD-10-CM | POA: Diagnosis not present

## 2014-05-11 DIAGNOSIS — N186 End stage renal disease: Secondary | ICD-10-CM | POA: Diagnosis not present

## 2014-05-11 DIAGNOSIS — Z992 Dependence on renal dialysis: Secondary | ICD-10-CM | POA: Insufficient documentation

## 2014-05-11 NOTE — ED Notes (Signed)
Pt c/o fever and cough. Pt reports a temperature of 100.2. Pt got a full dialysis treatment today. Pt denies any other symptoms.

## 2014-05-11 NOTE — ED Notes (Signed)
The patient states he does not want to stay for treatment.  Encouraged pt to stay for medical screening . He states he changed his mind and wants to go home.  Left with family.

## 2014-05-15 ENCOUNTER — Other Ambulatory Visit: Payer: Self-pay | Admitting: Orthopedic Surgery

## 2014-05-15 DIAGNOSIS — M25461 Effusion, right knee: Secondary | ICD-10-CM

## 2014-05-18 ENCOUNTER — Inpatient Hospital Stay (HOSPITAL_COMMUNITY): Payer: Medicare Other

## 2014-05-18 ENCOUNTER — Inpatient Hospital Stay (HOSPITAL_COMMUNITY): Payer: Medicare Other | Admitting: Anesthesiology

## 2014-05-18 ENCOUNTER — Encounter (HOSPITAL_COMMUNITY)
Admission: EM | Disposition: A | Discharge status: Home Health | Payer: Self-pay | Source: Home / Self Care | Attending: Internal Medicine

## 2014-05-18 ENCOUNTER — Encounter (HOSPITAL_COMMUNITY): Payer: Self-pay | Admitting: Emergency Medicine

## 2014-05-18 ENCOUNTER — Inpatient Hospital Stay (HOSPITAL_COMMUNITY)
Admission: EM | Admit: 2014-05-18 | Discharge: 2014-05-21 | DRG: 488 | Disposition: A | Discharge status: Home Health | Payer: Medicare Other | Attending: Internal Medicine | Admitting: Internal Medicine

## 2014-05-18 DIAGNOSIS — N2581 Secondary hyperparathyroidism of renal origin: Secondary | ICD-10-CM | POA: Diagnosis present

## 2014-05-18 DIAGNOSIS — Z9981 Dependence on supplemental oxygen: Secondary | ICD-10-CM

## 2014-05-18 DIAGNOSIS — I10 Essential (primary) hypertension: Secondary | ICD-10-CM | POA: Diagnosis not present

## 2014-05-18 DIAGNOSIS — M65861 Other synovitis and tenosynovitis, right lower leg: Secondary | ICD-10-CM | POA: Diagnosis present

## 2014-05-18 DIAGNOSIS — M94261 Chondromalacia, right knee: Secondary | ICD-10-CM | POA: Diagnosis present

## 2014-05-18 DIAGNOSIS — S83281A Other tear of lateral meniscus, current injury, right knee, initial encounter: Secondary | ICD-10-CM | POA: Diagnosis present

## 2014-05-18 DIAGNOSIS — I4892 Unspecified atrial flutter: Secondary | ICD-10-CM | POA: Diagnosis present

## 2014-05-18 DIAGNOSIS — Z6841 Body Mass Index (BMI) 40.0 and over, adult: Secondary | ICD-10-CM

## 2014-05-18 DIAGNOSIS — J9602 Acute respiratory failure with hypercapnia: Secondary | ICD-10-CM | POA: Diagnosis not present

## 2014-05-18 DIAGNOSIS — I272 Other secondary pulmonary hypertension: Secondary | ICD-10-CM | POA: Diagnosis present

## 2014-05-18 DIAGNOSIS — M79604 Pain in right leg: Secondary | ICD-10-CM | POA: Diagnosis present

## 2014-05-18 DIAGNOSIS — T4275XA Adverse effect of unspecified antiepileptic and sedative-hypnotic drugs, initial encounter: Secondary | ICD-10-CM | POA: Diagnosis not present

## 2014-05-18 DIAGNOSIS — G4733 Obstructive sleep apnea (adult) (pediatric): Secondary | ICD-10-CM | POA: Diagnosis present

## 2014-05-18 DIAGNOSIS — E875 Hyperkalemia: Secondary | ICD-10-CM | POA: Diagnosis present

## 2014-05-18 DIAGNOSIS — J9611 Chronic respiratory failure with hypoxia: Secondary | ICD-10-CM | POA: Diagnosis present

## 2014-05-18 DIAGNOSIS — M009 Pyogenic arthritis, unspecified: Principal | ICD-10-CM | POA: Diagnosis present

## 2014-05-18 DIAGNOSIS — I5022 Chronic systolic (congestive) heart failure: Secondary | ICD-10-CM | POA: Diagnosis present

## 2014-05-18 DIAGNOSIS — Z7901 Long term (current) use of anticoagulants: Secondary | ICD-10-CM

## 2014-05-18 DIAGNOSIS — M2341 Loose body in knee, right knee: Secondary | ICD-10-CM | POA: Diagnosis present

## 2014-05-18 DIAGNOSIS — Z992 Dependence on renal dialysis: Secondary | ICD-10-CM

## 2014-05-18 DIAGNOSIS — D649 Anemia, unspecified: Secondary | ICD-10-CM | POA: Diagnosis present

## 2014-05-18 DIAGNOSIS — N186 End stage renal disease: Secondary | ICD-10-CM | POA: Diagnosis present

## 2014-05-18 DIAGNOSIS — D631 Anemia in chronic kidney disease: Secondary | ICD-10-CM | POA: Diagnosis present

## 2014-05-18 DIAGNOSIS — R0602 Shortness of breath: Secondary | ICD-10-CM

## 2014-05-18 DIAGNOSIS — I120 Hypertensive chronic kidney disease with stage 5 chronic kidney disease or end stage renal disease: Secondary | ICD-10-CM | POA: Diagnosis present

## 2014-05-18 DIAGNOSIS — M179 Osteoarthritis of knee, unspecified: Secondary | ICD-10-CM | POA: Diagnosis present

## 2014-05-18 DIAGNOSIS — S83241A Other tear of medial meniscus, current injury, right knee, initial encounter: Secondary | ICD-10-CM | POA: Diagnosis present

## 2014-05-18 DIAGNOSIS — J9690 Respiratory failure, unspecified, unspecified whether with hypoxia or hypercapnia: Secondary | ICD-10-CM

## 2014-05-18 DIAGNOSIS — N19 Unspecified kidney failure: Secondary | ICD-10-CM

## 2014-05-18 DIAGNOSIS — I517 Cardiomegaly: Secondary | ICD-10-CM

## 2014-05-18 HISTORY — PX: KNEE ARTHROSCOPY: SHX127

## 2014-05-18 LAB — CREATININE, SERUM
Creatinine, Ser: 16.43 mg/dL — ABNORMAL HIGH (ref 0.50–1.35)
GFR calc Af Amer: 3 mL/min — ABNORMAL LOW (ref >90)
GFR calc non Af Amer: 3 mL/min — ABNORMAL LOW (ref >90)

## 2014-05-18 LAB — CBG MONITORING, ED: Glucose-Capillary: 99 mg/dL (ref 70–99)

## 2014-05-18 LAB — GRAM STAIN: SPECIAL REQUESTS: NORMAL

## 2014-05-18 LAB — CBC WITH DIFFERENTIAL/PLATELET
BASOS ABS: 0 10*3/uL (ref 0.0–0.1)
Basophils Relative: 0 % (ref 0–1)
Eosinophils Absolute: 0.1 10*3/uL (ref 0.0–0.7)
Eosinophils Relative: 1 % (ref 0–5)
HEMATOCRIT: 32.7 % — AB (ref 39.0–52.0)
Hemoglobin: 9.9 g/dL — ABNORMAL LOW (ref 13.0–17.0)
LYMPHS ABS: 0.5 10*3/uL — AB (ref 0.7–4.0)
Lymphocytes Relative: 6 % — ABNORMAL LOW (ref 12–46)
MCH: 28.4 pg (ref 26.0–34.0)
MCHC: 30.3 g/dL (ref 30.0–36.0)
MCV: 93.7 fL (ref 78.0–100.0)
Monocytes Absolute: 0.8 10*3/uL (ref 0.1–1.0)
Monocytes Relative: 9 % (ref 3–12)
Neutro Abs: 7.6 10*3/uL (ref 1.7–7.7)
Neutrophils Relative %: 84 % — ABNORMAL HIGH (ref 43–77)
Platelets: 131 10*3/uL — ABNORMAL LOW (ref 150–400)
RBC: 3.49 MIL/uL — ABNORMAL LOW (ref 4.22–5.81)
RDW: 14.6 % (ref 11.5–15.5)
WBC: 9 10*3/uL (ref 4.0–10.5)

## 2014-05-18 LAB — CBC
HEMATOCRIT: 33.9 % — AB (ref 39.0–52.0)
Hemoglobin: 10.4 g/dL — ABNORMAL LOW (ref 13.0–17.0)
MCH: 29.1 pg (ref 26.0–34.0)
MCHC: 30.7 g/dL (ref 30.0–36.0)
MCV: 94.7 fL (ref 78.0–100.0)
Platelets: 141 10*3/uL — ABNORMAL LOW (ref 150–400)
RBC: 3.58 MIL/uL — ABNORMAL LOW (ref 4.22–5.81)
RDW: 14.8 % (ref 11.5–15.5)
WBC: 8.8 10*3/uL (ref 4.0–10.5)

## 2014-05-18 LAB — I-STAT CHEM 8, ED
BUN: 83 mg/dL — AB (ref 6–23)
CALCIUM ION: 1.04 mmol/L — AB (ref 1.12–1.23)
CREATININE: 15.1 mg/dL — AB (ref 0.50–1.35)
Chloride: 100 mmol/L (ref 96–112)
GLUCOSE: 107 mg/dL — AB (ref 70–99)
HEMATOCRIT: 37 % — AB (ref 39.0–52.0)
Hemoglobin: 12.6 g/dL — ABNORMAL LOW (ref 13.0–17.0)
Potassium: 4.9 mmol/L (ref 3.5–5.1)
Sodium: 137 mmol/L (ref 135–145)
TCO2: 24 mmol/L (ref 0–100)

## 2014-05-18 LAB — SYNOVIAL CELL COUNT + DIFF, W/ CRYSTALS
Crystals, Fluid: NONE SEEN
EOSINOPHILS-SYNOVIAL: 0 % (ref 0–1)
Lymphocytes-Synovial Fld: 0 % (ref 0–20)
Monocyte-Macrophage-Synovial Fluid: 1 % — ABNORMAL LOW (ref 50–90)
Neutrophil, Synovial: 99 % — ABNORMAL HIGH (ref 0–25)
WBC, SYNOVIAL: 151530 /mm3 — AB (ref 0–200)

## 2014-05-18 LAB — SEDIMENTATION RATE: Sed Rate: 66 mm/hr — ABNORMAL HIGH (ref 0–16)

## 2014-05-18 LAB — TRIGLYCERIDES: TRIGLYCERIDES: 90 mg/dL (ref <150)

## 2014-05-18 SURGERY — ARTHROSCOPY, KNEE
Anesthesia: General | Site: Knee | Laterality: Right

## 2014-05-18 MED ORDER — URSODIOL 300 MG PO CAPS
300.0000 mg | ORAL_CAPSULE | Freq: Three times a day (TID) | ORAL | Status: DC
  Administered 2014-05-19 – 2014-05-21 (×6): 300 mg via ORAL
  Filled 2014-05-18 (×10): qty 1

## 2014-05-18 MED ORDER — LIDOCAINE HCL (CARDIAC) 20 MG/ML IV SOLN
INTRAVENOUS | Status: AC
  Filled 2014-05-18: qty 5

## 2014-05-18 MED ORDER — HEPARIN SODIUM (PORCINE) 5000 UNIT/ML IJ SOLN
5000.0000 [IU] | Freq: Three times a day (TID) | INTRAMUSCULAR | Status: DC
  Administered 2014-05-19 – 2014-05-21 (×8): 5000 [IU] via SUBCUTANEOUS
  Filled 2014-05-18 (×10): qty 1

## 2014-05-18 MED ORDER — DEXAMETHASONE SODIUM PHOSPHATE 4 MG/ML IJ SOLN
INTRAMUSCULAR | Status: AC
  Filled 2014-05-18: qty 1

## 2014-05-18 MED ORDER — ONDANSETRON HCL 4 MG/2ML IJ SOLN: 4.0000 mg | Freq: Three times a day (TID) | INTRAMUSCULAR | Status: DC | PRN

## 2014-05-18 MED ORDER — SODIUM CHLORIDE 0.9 % IJ SOLN
3.0000 mL | Freq: Two times a day (BID) | INTRAMUSCULAR | Status: DC
  Administered 2014-05-18 – 2014-05-21 (×5): 3 mL via INTRAVENOUS

## 2014-05-18 MED ORDER — PHENYLEPHRINE 40 MCG/ML (10ML) SYRINGE FOR IV PUSH (FOR BLOOD PRESSURE SUPPORT)
PREFILLED_SYRINGE | INTRAVENOUS | Status: AC
  Filled 2014-05-18: qty 10

## 2014-05-18 MED ORDER — SODIUM CHLORIDE 0.9 % IV SOLN
INTRAVENOUS | Status: DC | PRN
  Administered 2014-05-18: 19:00:00 via INTRAVENOUS

## 2014-05-18 MED ORDER — PIPERACILLIN-TAZOBACTAM IN DEX 2-0.25 GM/50ML IV SOLN
2.2500 g | Freq: Three times a day (TID) | INTRAVENOUS | Status: DC
  Administered 2014-05-19 (×2): 2.25 g via INTRAVENOUS
  Filled 2014-05-18 (×6): qty 50

## 2014-05-18 MED ORDER — CEFAZOLIN SODIUM-DEXTROSE 2-3 GM-% IV SOLR: 2.0000 g | Freq: Four times a day (QID) | INTRAVENOUS | Status: DC

## 2014-05-18 MED ORDER — STERILE WATER FOR IRRIGATION IR SOLN
Status: DC | PRN
  Administered 2014-05-18: 1000 mL

## 2014-05-18 MED ORDER — CETYLPYRIDINIUM CHLORIDE 0.05 % MT LIQD
7.0000 mL | Freq: Four times a day (QID) | OROMUCOSAL | Status: DC
  Administered 2014-05-19 (×2): 7 mL via OROMUCOSAL

## 2014-05-18 MED ORDER — MIDAZOLAM HCL 2 MG/2ML IJ SOLN
INTRAMUSCULAR | Status: AC
  Filled 2014-05-18: qty 2

## 2014-05-18 MED ORDER — FENTANYL CITRATE 0.05 MG/ML IJ SOLN
25.0000 ug | INTRAMUSCULAR | Status: DC | PRN
  Administered 2014-05-18 (×2): 50 ug via INTRAVENOUS

## 2014-05-18 MED ORDER — ALBUTEROL SULFATE (2.5 MG/3ML) 0.083% IN NEBU: 2.5000 mg | INHALATION_SOLUTION | Freq: Four times a day (QID) | RESPIRATORY_TRACT | Status: DC | PRN

## 2014-05-18 MED ORDER — SUFENTANIL CITRATE 50 MCG/ML IV SOLN
INTRAVENOUS | Status: AC
  Filled 2014-05-18: qty 1

## 2014-05-18 MED ORDER — METOCLOPRAMIDE HCL 5 MG/ML IJ SOLN: 5.0000 mg | Freq: Three times a day (TID) | INTRAMUSCULAR | Status: DC | PRN

## 2014-05-18 MED ORDER — PROPOFOL 10 MG/ML IV EMUL
0.0000 ug/kg/min | INTRAVENOUS | Status: DC
  Administered 2014-05-18: 25 ug/kg/min via INTRAVENOUS
  Administered 2014-05-19: 30 ug/kg/min via INTRAVENOUS
  Administered 2014-05-19: 15 ug/kg/min via INTRAVENOUS
  Filled 2014-05-18 (×2): qty 100

## 2014-05-18 MED ORDER — PANTOPRAZOLE SODIUM 40 MG PO TBEC
40.0000 mg | DELAYED_RELEASE_TABLET | Freq: Every day | ORAL | Status: DC
  Administered 2014-05-19 – 2014-05-21 (×3): 40 mg via ORAL
  Filled 2014-05-18 (×3): qty 1

## 2014-05-18 MED ORDER — SODIUM CHLORIDE 0.9 % IR SOLN
Status: DC | PRN
  Administered 2014-05-18 (×2): 3000 mL

## 2014-05-18 MED ORDER — METOCLOPRAMIDE HCL 5 MG PO TABS
5.0000 mg | ORAL_TABLET | Freq: Three times a day (TID) | ORAL | Status: DC | PRN
  Filled 2014-05-18: qty 2

## 2014-05-18 MED ORDER — FENTANYL CITRATE 0.05 MG/ML IJ SOLN
INTRAMUSCULAR | Status: AC
  Filled 2014-05-18: qty 2

## 2014-05-18 MED ORDER — SUCCINYLCHOLINE CHLORIDE 20 MG/ML IJ SOLN
INTRAMUSCULAR | Status: AC
  Filled 2014-05-18: qty 1

## 2014-05-18 MED ORDER — ONDANSETRON HCL 4 MG/2ML IJ SOLN
4.0000 mg | Freq: Four times a day (QID) | INTRAMUSCULAR | Status: DC | PRN
Start: 2014-05-18 — End: 2014-05-18

## 2014-05-18 MED ORDER — PROMETHAZINE HCL 25 MG/ML IJ SOLN: 6.2500 mg | INTRAMUSCULAR | Status: DC | PRN

## 2014-05-18 MED ORDER — ONDANSETRON HCL 4 MG/2ML IJ SOLN
INTRAMUSCULAR | Status: AC
  Filled 2014-05-18: qty 2

## 2014-05-18 MED ORDER — FENTANYL CITRATE 0.05 MG/ML IJ SOLN
50.0000 ug | INTRAMUSCULAR | Status: DC | PRN
  Administered 2014-05-18 – 2014-05-19 (×2): 100 ug via INTRAVENOUS
  Filled 2014-05-18 (×2): qty 2

## 2014-05-18 MED ORDER — PHENYLEPHRINE HCL 10 MG/ML IJ SOLN
INTRAMUSCULAR | Status: DC | PRN
  Administered 2014-05-18 (×3): 80 ug via INTRAVENOUS

## 2014-05-18 MED ORDER — LIDOCAINE-EPINEPHRINE (PF) 2 %-1:200000 IJ SOLN
10.0000 mL | Freq: Once | INTRAMUSCULAR | Status: AC
  Administered 2014-05-18: 10 mL
  Filled 2014-05-18: qty 20

## 2014-05-18 MED ORDER — PROPOFOL 10 MG/ML IV BOLUS
INTRAVENOUS | Status: DC | PRN
  Administered 2014-05-18: 200 mg via INTRAVENOUS
  Administered 2014-05-18 (×2): 50 mg via INTRAVENOUS

## 2014-05-18 MED ORDER — OXYCODONE-ACETAMINOPHEN 5-325 MG PO TABS
1.0000 | ORAL_TABLET | ORAL | Status: DC | PRN
  Administered 2014-05-19: 1 via ORAL
  Filled 2014-05-18: qty 1

## 2014-05-18 MED ORDER — LIDOCAINE HCL (CARDIAC) 20 MG/ML IV SOLN
INTRAVENOUS | Status: DC | PRN
  Administered 2014-05-18: 100 mg via INTRAVENOUS

## 2014-05-18 MED ORDER — ONDANSETRON HCL 4 MG/2ML IJ SOLN
INTRAMUSCULAR | Status: DC | PRN
  Administered 2014-05-18: 4 mg via INTRAVENOUS

## 2014-05-18 MED ORDER — ALBUTEROL SULFATE HFA 108 (90 BASE) MCG/ACT IN AERS: 1.0000 | INHALATION_SPRAY | Freq: Four times a day (QID) | RESPIRATORY_TRACT | Status: DC | PRN

## 2014-05-18 MED ORDER — DEXAMETHASONE SODIUM PHOSPHATE 4 MG/ML IJ SOLN
INTRAMUSCULAR | Status: DC | PRN
  Administered 2014-05-18: 4 mg via INTRAVENOUS

## 2014-05-18 MED ORDER — ONDANSETRON HCL 4 MG PO TABS
4.0000 mg | ORAL_TABLET | Freq: Four times a day (QID) | ORAL | Status: DC | PRN
  Administered 2014-05-21: 4 mg via ORAL
  Filled 2014-05-18: qty 1

## 2014-05-18 MED ORDER — PROPOFOL 10 MG/ML IV BOLUS
INTRAVENOUS | Status: AC
  Filled 2014-05-18: qty 20

## 2014-05-18 MED ORDER — CALCITRIOL 0.5 MCG PO CAPS
0.5000 ug | ORAL_CAPSULE | Freq: Every day | ORAL | Status: DC
  Administered 2014-05-19 – 2014-05-21 (×3): 0.5 ug via ORAL
  Filled 2014-05-18 (×4): qty 1

## 2014-05-18 MED ORDER — PROPOFOL 10 MG/ML IV EMUL
INTRAVENOUS | Status: AC
  Filled 2014-05-18: qty 100

## 2014-05-18 MED ORDER — CHLORHEXIDINE GLUCONATE 0.12 % MT SOLN
15.0000 mL | Freq: Two times a day (BID) | OROMUCOSAL | Status: DC
  Administered 2014-05-18 – 2014-05-19 (×2): 15 mL via OROMUCOSAL
  Filled 2014-05-18 (×2): qty 15

## 2014-05-18 MED ORDER — SODIUM CHLORIDE 0.9 % IJ SOLN
INTRAMUSCULAR | Status: AC
  Filled 2014-05-18: qty 10

## 2014-05-18 MED ORDER — VANCOMYCIN HCL 10 G IV SOLR
2500.0000 mg | Freq: Once | INTRAVENOUS | Status: AC
  Administered 2014-05-18 (×2): 2500 mg via INTRAVENOUS
  Filled 2014-05-18: qty 2500

## 2014-05-18 MED ORDER — ONDANSETRON HCL 4 MG/2ML IJ SOLN: 4.0000 mg | Freq: Four times a day (QID) | INTRAMUSCULAR | Status: DC | PRN

## 2014-05-18 MED ORDER — PANTOPRAZOLE SODIUM 40 MG IV SOLR: 40.0000 mg | Freq: Every day | INTRAVENOUS | Status: DC

## 2014-05-18 MED ORDER — DEXTROSE 5 % IV SOLN
2.0000 g | Freq: Once | INTRAVENOUS | Status: AC
  Administered 2014-05-18: 2 g via INTRAVENOUS
  Filled 2014-05-18: qty 2

## 2014-05-18 MED ORDER — SUCCINYLCHOLINE CHLORIDE 20 MG/ML IJ SOLN
INTRAMUSCULAR | Status: DC | PRN
  Administered 2014-05-18: 160 mg via INTRAVENOUS

## 2014-05-18 MED ORDER — LANTHANUM CARBONATE 500 MG PO CHEW
1000.0000 mg | CHEWABLE_TABLET | Freq: Three times a day (TID) | ORAL | Status: DC
  Administered 2014-05-19 – 2014-05-20 (×3): 2000 mg via ORAL
  Filled 2014-05-18 (×7): qty 4

## 2014-05-18 MED ORDER — ONDANSETRON HCL 4 MG PO TABS: 4.0000 mg | ORAL_TABLET | Freq: Four times a day (QID) | ORAL | Status: DC | PRN

## 2014-05-18 SURGICAL SUPPLY — 43 items
BANDAGE ELASTIC 6 VELCRO ST LF (GAUZE/BANDAGES/DRESSINGS) ×3 IMPLANT
BANDAGE ESMARK 6X9 LF (GAUZE/BANDAGES/DRESSINGS) IMPLANT
BLADE CUTTER GATOR 3.5 (BLADE) ×3 IMPLANT
BLADE SURG 11 STRL SS (BLADE) ×3 IMPLANT
BLADE SURG ROTATE 9660 (MISCELLANEOUS) IMPLANT
BNDG ESMARK 6X9 LF (GAUZE/BANDAGES/DRESSINGS)
BOOTCOVER CLEANROOM LRG (PROTECTIVE WEAR) IMPLANT
CONT SPECI 4OZ STER CLIK (MISCELLANEOUS) ×3 IMPLANT
COVER SURGICAL LIGHT HANDLE (MISCELLANEOUS) ×3 IMPLANT
CUFF TOURNIQUET SINGLE 34IN LL (TOURNIQUET CUFF) ×3 IMPLANT
CUTTER MENISCUS 3.5MM 6/BX (BLADE) IMPLANT
DRAPE ARTHROSCOPY W/POUCH 114 (DRAPES) ×3 IMPLANT
DRAPE U-SHAPE 47X51 STRL (DRAPES) ×3 IMPLANT
DRSG PAD ABDOMINAL 8X10 ST (GAUZE/BANDAGES/DRESSINGS) ×3 IMPLANT
FACESHIELD WRAPAROUND (MASK) IMPLANT
GAUZE SPONGE 4X4 12PLY STRL (GAUZE/BANDAGES/DRESSINGS) ×3 IMPLANT
GAUZE XEROFORM 1X8 LF (GAUZE/BANDAGES/DRESSINGS) ×6 IMPLANT
GLOVE BIO SURGEON STRL SZ7.5 (GLOVE) ×3 IMPLANT
GLOVE BIO SURGEON STRL SZ8 (GLOVE) ×3 IMPLANT
GLOVE BIOGEL PI IND STRL 8 (GLOVE) ×2 IMPLANT
GLOVE BIOGEL PI INDICATOR 8 (GLOVE) ×4
GOWN STRL REUS W/ TWL LRG LVL3 (GOWN DISPOSABLE) ×1 IMPLANT
GOWN STRL REUS W/ TWL XL LVL3 (GOWN DISPOSABLE) ×2 IMPLANT
GOWN STRL REUS W/TWL 2XL LVL3 (GOWN DISPOSABLE) IMPLANT
GOWN STRL REUS W/TWL LRG LVL3 (GOWN DISPOSABLE) ×2
GOWN STRL REUS W/TWL XL LVL3 (GOWN DISPOSABLE) ×4
KIT ROOM TURNOVER OR (KITS) ×3 IMPLANT
MANIFOLD NEPTUNE II (INSTRUMENTS) ×3 IMPLANT
NEEDLE 22X1 1/2 (OR ONLY) (NEEDLE) IMPLANT
NEEDLE SPNL 18GX3.5 QUINCKE PK (NEEDLE) ×3 IMPLANT
NS IRRIG 1000ML POUR BTL (IV SOLUTION) IMPLANT
PACK ARTHROSCOPY DSU (CUSTOM PROCEDURE TRAY) ×3 IMPLANT
PAD ARMBOARD 7.5X6 YLW CONV (MISCELLANEOUS) ×3 IMPLANT
PADDING CAST COTTON 6X4 STRL (CAST SUPPLIES) IMPLANT
SET ARTHROSCOPY TUBING (MISCELLANEOUS) ×2
SET ARTHROSCOPY TUBING LN (MISCELLANEOUS) ×1 IMPLANT
SPONGE LAP 4X18 X RAY DECT (DISPOSABLE) ×3 IMPLANT
SUT ETHILON 2 0 FS 18 (SUTURE) ×3 IMPLANT
TOWEL OR 17X24 6PK STRL BLUE (TOWEL DISPOSABLE) IMPLANT
TOWEL OR 17X26 10 PK STRL BLUE (TOWEL DISPOSABLE) ×3 IMPLANT
TUBE CONNECTING 12'X1/4 (SUCTIONS) ×1
TUBE CONNECTING 12X1/4 (SUCTIONS) ×2 IMPLANT
WATER STERILE IRR 1000ML POUR (IV SOLUTION) ×3 IMPLANT

## 2014-05-18 NOTE — Transfer of Care (Signed)
Immediate Anesthesia Transfer of Care Note  Patient: Gary Rivera  Procedure(s) Performed: Procedure(s): ARTHROSCOPIC WASHOUT OF RIGHT KNEE (Right)  Patient Location: PACU  Anesthesia Type:General  Level of Consciousness: patient cooperative, lethargic, responds to stimulation and Patient remains intubated per anesthesia plan  Airway & Oxygen Therapy: Patient remains intubated per anesthesia plan and Patient placed on Ventilator (see vital sign flow sheet for setting)  Post-op Assessment: Report given to RN, Post -op Vital signs reviewed and stable and Patient moving all extremities X 4  Post vital signs: Reviewed and stable  Last Vitals:  Filed Vitals:   05/18/14 2025  BP: 177/85  Pulse: 88  Temp: 36.3 C  Resp: 17    Complications: respiratory complications

## 2014-05-18 NOTE — H&P (Addendum)
PULMONARY / CRITICAL CARE MEDICINE   Name: Gary Rivera MRN: [id number] DOB: 09/11/1960    ADMISSION DATE:  05/18/2014 CONSULTATION DATE:  2/28  REFERRING MD :  Rose/anesthesia   INITIAL PRESENTATION:  54 M with ESRD and CHF, s/p septic knee washout 2/28 evening. Could not be extubated due to prolonged somnolence post op. Transferred from PACU to ICU intubated. PCCM to asked ot assume care while in ICU. Initially admitted by [facility] service  STUDIES:  2/28 L knee plain Xray: Negative for fracture. Moderate joint effusion. Mild medial compartment degenerative disease  SIGNIFICANT EVENTS: 2/28 admitted to [facility] service with CC of L knee pain and swelling   HISTORY OF PRESENT ILLNESS:   Unable to provide history due to post-anesthetic state. His admission history has been reviewed. Briefly this is a 54 y/o man with a hx of ESRD on home HD (apparently he sticks himself in access site) who presents w/ R knee pain, swelling, as well as fevers and chills. He was found to have a septic arthritis of a native joint and is now s/p L knee washout in OR under general anesthesia today. Remained excessively somnolent post op. Dr Okey Dupre of anesthesia felt that he could not be safely extubated and pt was transferred to ICU on MV. He is presently requiring no vasoactive medications.  PAST MEDICAL HISTORY :   has a past medical history of ESRD on hemodialysis; CHF (congestive heart failure); Peritoneal dialysis catheter in place; Anemia; Anxiety state, unspecified; and Hypertension.  has past surgical history that includes Appendectomy; Peritoneal catheter insertion; TEE without cardioversion (N/A, 07/11/2013); and Cardioversion (N/A, 07/11/2013). Prior to Admission medications   Medication Sig Start Date End Date Taking? Authorizing Provider  calcitRIOL (ROCALTROL) 0.5 MCG capsule Take 0.5 mcg by mouth daily.   Yes Historical Provider, MD  clonazePAM (KLONOPIN) 0.5 MG tablet Take 1 mg by mouth 2 (two) times  daily as needed for anxiety.    Yes Historical Provider, MD  lanthanum (FOSRENOL) 1000 MG chewable tablet Chew 1,000-2,000 mg by mouth 3 (three) times daily with meals. Takes 2 tablets with meals and 1 tablet with snacks.   Yes Historical Provider, MD  metoCLOPramide (REGLAN) 10 MG tablet Take 10 mg by mouth daily. 05/03/14  Yes Historical Provider, MD  omeprazole (PRILOSEC) 20 MG capsule Take 20 mg by mouth daily.  02/24/14  Yes Historical Provider, MD  Oxycodone HCl 10 MG TABS Take 10 mg by mouth 3 (three) times daily as needed.  05/01/14  Yes Historical Provider, MD  ursodiol (ACTIGALL) 300 MG capsule Take 1 capsule (300 mg total) by mouth 3 (three) times daily. 07/22/13  Yes Almond Lint, MD  albuterol (PROVENTIL HFA;VENTOLIN HFA) 108 (90 BASE) MCG/ACT inhaler Inhale 1 puff into the lungs every 6 (six) hours as needed for wheezing or shortness of breath.    Historical Provider, MD  amoxicillin-clavulanate (AUGMENTIN) 875-125 MG per tablet Take 1 tablet by mouth 2 (two) times daily. 05/11/14 05/18/14  Historical Provider, MD  oxyCODONE-acetaminophen (PERCOCET/ROXICET) 5-325 MG per tablet Take 1-2 tablets by mouth every 4 (four) hours as needed (severe pain). Patient not taking: Reported on 05/18/2014 04/20/14   Nelia Shi, MD  warfarin (COUMADIN) 5 MG tablet Take 1 tablet (5 mg total) by mouth daily. Patient not taking: Reported on 05/18/2014 01/12/14   Gwyneth Sprout, MD   Allergies  Allergen Reactions  . Renvela [Sevelamer] Nausea And Vomiting    FAMILY HISTORY:  indicated that his mother is deceased. He  indicated that his sister is alive.  SOCIAL HISTORY:  reports that he has never smoked. He has never used smokeless tobacco. He reports that he does not drink alcohol or use illicit drugs.  REVIEW OF SYSTEMS:  N/A  SUBJECTIVE:   VITAL SIGNS: Temp:  [97.4 F (36.3 C)-98.4 F (36.9 C)] 97.4 F (36.3 C) (02/28 2025) Pulse Rate:  [84-114] 114 (02/28 2045) Resp:  [17-29] 23 (02/28  2045) BP: (108-191)/(55-85) 191/80 mmHg (02/28 2045) SpO2:  [83 %-100 %] 97 % (02/28 2045) FiO2 (%):  [40 %] 40 % (02/28 2025) HEMODYNAMICS:   VENTILATOR SETTINGS: Vent Mode:  [-]  FiO2 (%):  [40 %] 40 % INTAKE / OUTPUT:  Intake/Output Summary (Last 24 hours) at 05/18/14 2106 Last data filed at 05/18/14 2017  Gross per 24 hour  Intake   1000 ml  Output      0 ml  Net   1000 ml    PHYSICAL EXAMINATION: General:  Obese man in NAD, intubated and sedated Neuro:  Wakes to noxious stimulus. HEENT:  Copious oral secretions Cardiovascular:  No murmurs. Lungs:  Mechanical vent sounds Abdomen:  Obese, non-distended Musculoskeletal:  Large surgical dressing on R knee; this was not opened. Skin:  No rashes/breakdown noted.  LABS:  CBC  Recent Labs Lab 05/18/14 1526 05/18/14 1545  WBC 9.0  --   HGB 9.9* 12.6*  HCT 32.7* 37.0*  PLT 131*  --    Coag's No results for input(s): APTT, INR in the last 168 hours. BMET  Recent Labs Lab 05/18/14 1545  NA 137  K 4.9  CL 100  BUN 83*  CREATININE 15.10*  GLUCOSE 107*   Electrolytes No results for input(s): CALCIUM, MG, PHOS in the last 168 hours. Sepsis Markers No results for input(s): LATICACIDVEN, PROCALCITON, O2SATVEN in the last 168 hours. ABG No results for input(s): PHART, PCO2ART, PO2ART in the last 168 hours. Liver Enzymes No results for input(s): AST, ALT, ALKPHOS, BILITOT, ALBUMIN in the last 168 hours. Cardiac Enzymes No results for input(s): TROPONINI, PROBNP in the last 168 hours. Glucose  Recent Labs Lab 05/18/14 1514  GLUCAP 99    Imaging No results found.   ASSESSMENT / PLAN:  PULMONARY ETT 2/28 >>  A: Vet dependent respiratory failure P:   Vent settings established Vent bundle implemented Daily SBT  CARDIOVASCULAR A:  Hypertension due to discomfort H/O CHF Concern for infective endocarditis given high risk pt p/w septic native joint P:  Treat discomfort Monitor hemodynamics TTE  to eval for vegitations -> IE.  RENAL A:   ESRD on home HD P:   Renal service has been notified Monitor BMET intermittently Monitor I/Os Correct electrolytes as indicated  GASTROINTESTINAL A:   No issues P:   SUP: IV PPI ordered Consider gastric tube placement and initiation of TFs 2/29 if not extubated  HEMATOLOGIC A:   Chronic anemia of ESRD P:  DVT px: SQ heparin Monitor CBC intermittently Transfuse per usual ICU guidelines  INFECTIOUS A:   Septic arthritis Possible IE, will obtain blood cx/TTE P:   Blood 2/28 >>  L knee effusion 2/28 >> neg Gram's stain >>   Vanc 2/28 >>  Pip-tazo 2/28 >>   ENDOCRINE A:   No issues P:   Monitor glu on chem panels Consider SSI for glu > 180  NEUROLOGIC A:   Prolonged post op anesthetic effect Post op pain Chronic use of benzodiazepines and opioids P:   RASS goal: 0, -1 Propofol and  PRN fentanyl Daily WUA   CRITICAL CARE Performed by: Jamie Kato   Total critical care time: 30 minutes  Critical care time was exclusive of separately billable procedures and treating other patients.  Critical care was necessary to treat or prevent imminent or life-threatening deterioration.  Critical care was time spent personally by me on the following activities: development of treatment plan with patient and/or surrogate as well as nursing, discussions with consultants, evaluation of patient's response to treatment, examination of patient, obtaining history from patient or surrogate, ordering and performing treatments and interventions, ordering and review of laboratory studies, ordering and review of radiographic studies, pulse oximetry and re-evaluation of patient's condition.    Jamie Kato, MD Pulmonary & Critical Care Medicine May 18, 2014, 11:50 PM   Pulmonary and Critical Care Medicine [facility] Pager: [phone]  05/18/2014, 9:06 PM

## 2014-05-18 NOTE — ED Notes (Signed)
Dr Anitra Lauth to bedside to draw off fluid from pt knee

## 2014-05-18 NOTE — Progress Notes (Signed)
Changed from CPAP/PS to Vt 660 8cc/kg for his height and rate of 15 with peep +5 and 40%. Transporting to SICU  Without issues. Placed in room 9 and doing well.

## 2014-05-18 NOTE — Addendum Note (Signed)
Addendum  created 05/18/14 2351 by Melina Schools, CRNA   Modules edited: Anesthesia Blocks and Procedures, Anesthesia LDA, Clinical Notes, Lines/Drains/Airways Properties Editor   Clinical Notes:  File: [id number]   Lines/Drains/Airways Properties Editor:  Properties of line/drain/airway/wound Airway 8 mm have been modified.

## 2014-05-18 NOTE — ED Provider Notes (Signed)
CSN: [id number]     Arrival date & time 05/18/14  1402 History   First MD Initiated Contact with Patient 05/18/14 1405     Chief Complaint  Patient presents with  . Leg Pain    Right     (Consider location/radiation/quality/duration/timing/severity/associated sxs/prior Treatment) HPI Comments: Patient states worsening right knee swelling over the last 1 month. He was seen by an orthopedist but currently is waiting to get an appointment next week to have fluid drawn off and injections. He has had plain x-rays in the past that show arthritis. He denies any fever.  Patient is having worsening pain and having a hard time ambulating due to the pain. He is taking Percocet without improvement. He denies any trauma  Patient is a 54 y.o. male presenting with leg pain. The history is provided by the patient.  Leg Pain Location:  Knee Time since incident:  1 month Injury: no   Knee location:  R knee Pain details:    Quality:  Aching, throbbing and shooting   Radiates to:  Does not radiate   Severity:  Severe   Onset quality:  Gradual   Duration:  1 month   Timing:  Constant   Progression:  Worsening Chronicity:  New Relieved by:  Nothing Worsened by:  Activity and bearing weight Ineffective treatments:  Ice, rest and elevation Associated symptoms: decreased ROM and swelling   Associated symptoms: no fever     Past Medical History  Diagnosis Date  . ESRD on hemodialysis     Started dialysis with PD in 2013 and switched to HD in spring 2014 because of fungal peritonitis.  Takes dialysis at home 5 days a week approx 3h 15 min each session.     . CHF (congestive heart failure)   . Peritoneal dialysis catheter in place   . Anemia     patient reporats that last hgb 7.9 a week ago  . Anxiety state, unspecified   . Hypertension    Past Surgical History  Procedure Laterality Date  . Appendectomy    . Peritoneal catheter insertion    . Tee without cardioversion N/A 07/11/2013   Procedure: TRANSESOPHAGEAL ECHOCARDIOGRAM (TEE);  Surgeon: Pricilla Riffle, MD;  Location: [facility] ENDOSCOPY;  Service: Cardiovascular;  Laterality: N/A;  . Cardioversion N/A 07/11/2013    Procedure: CARDIOVERSION;  Surgeon: Pricilla Riffle, MD;  Location: [facility] ENDOSCOPY;  Service: Cardiovascular;  Laterality: N/A;   Family History  Problem Relation Age of Onset  . Diabetes Mother   . Hypertension Mother   . Hypertension Sister   . Asthma Sister    History  Substance Use Topics  . Smoking status: Never Smoker   . Smokeless tobacco: Never Used  . Alcohol Use: No    Review of Systems  Constitutional: Negative for fever.  All other systems reviewed and are negative.     Allergies  Renvela  Home Medications   Prior to Admission medications   Medication Sig Start Date End Date Taking? Authorizing Provider  albuterol (PROVENTIL HFA;VENTOLIN HFA) 108 (90 BASE) MCG/ACT inhaler Inhale 1 puff into the lungs every 6 (six) hours as needed for wheezing or shortness of breath.    Historical Provider, MD  calcitRIOL (ROCALTROL) 0.5 MCG capsule Take 0.5 mcg by mouth daily.    Historical Provider, MD  clonazePAM (KLONOPIN) 0.5 MG tablet Take 0.5 mg by mouth 2 (two) times daily as needed for anxiety.     Historical Provider, MD  lanthanum (FOSRENOL) 1000 MG  chewable tablet Chew 1,000-2,000 mg by mouth 3 (three) times daily with meals. Takes 2 tablets with meals and 1 tablet with snacks.    Historical Provider, MD  omeprazole (PRILOSEC) 10 MG capsule Take 10 mg by mouth daily.    Historical Provider, MD  oxyCODONE-acetaminophen (PERCOCET/ROXICET) 5-325 MG per tablet Take 1-2 tablets by mouth every 4 (four) hours as needed (severe pain). 04/20/14   Nelia Shi, MD  ursodiol (ACTIGALL) 300 MG capsule Take 1 capsule (300 mg total) by mouth 3 (three) times daily. 07/22/13   Almond Lint, MD  warfarin (COUMADIN) 5 MG tablet Take 1 tablet (5 mg total) by mouth daily. 01/12/14   Gwyneth Sprout, MD   BP 139/71  mmHg  Pulse 90  Temp(Src) 98.4 F (36.9 C) (Oral)  Resp 18  SpO2 95% Physical Exam  Constitutional: He is oriented to person, place, and time. He appears well-developed and well-nourished. No distress.  HENT:  Head: Normocephalic and atraumatic.  Cardiovascular: Normal rate.   Pulmonary/Chest: Effort normal.  Musculoskeletal:       Right knee: He exhibits decreased range of motion, swelling and effusion.       Legs: Dialysis graft in the left upper extremity with palpable thrill  Neurological: He is alert and oriented to person, place, and time.  Skin: Skin is warm and dry.  Psychiatric: He has a normal mood and affect. His behavior is normal.  Nursing note and vitals reviewed.   ED Course  ARTHOCENTESIS Date/Time: 05/18/2014 3:22 PM Performed by: Gwyneth Sprout Authorized by: Gwyneth Sprout Consent: Verbal consent obtained. Risks and benefits: risks, benefits and alternatives were discussed Consent given by: patient Test results: test results available and properly labeled Site marked: the operative site was marked Patient identity confirmed: verbally with patient Time out: Immediately prior to procedure a "time out" was called to verify the correct patient, procedure, equipment, support staff and site/side marked as required. Indications: joint swelling,  pain and diagnostic evaluation  Body area: knee Joint: right knee Local anesthesia used: yes Anesthesia: local infiltration Local anesthetic: lidocaine 2% with epinephrine Anesthetic total: 10 ml Patient sedated: no Preparation: Patient was prepped and draped in the usual sterile fashion. Needle gauge: 18 G Ultrasound guidance: no Approach: medial Aspirate: cloudy,  yellow and blood-tinged Aspirate amount: 50 mL Patient tolerance: Patient tolerated the procedure well with no immediate complications   (including critical care time) Labs Review Labs Reviewed - No data to display  Imaging Review No results  found.   EKG Interpretation None      MDM   Final diagnoses:  None    Patient here for persistent right knee pain with worsening swelling for the last 1 month. He was seen by orthopedics but is currently waiting to follow-up for injections and intervention. He has had plain x-rays that show arthritis. He denies any infectious symptoms such as fever or drainage from the knee. He is able to move the knee but it is tender. Possibility for gout but low suspicion for septic joint. Will aspirate the knee and send fluid.  Patient denies being on Coumadin at this time.  3:20 PM 50mL of cloudy yellow fluid sent for evaluation.  Leg iced, wrapped and elevated.  3:23 PM  Pt checked out to Dr. Hyacinth Meeker pending synovial fluid results.  Gwyneth Sprout, MD 05/18/14 [phone]

## 2014-05-18 NOTE — Op Note (Signed)
05/18/2014  8:01 PM  PATIENT:  Gary Rivera    PRE-OPERATIVE DIAGNOSIS:  SEPTIC RIGHT KNEE  POST-OPERATIVE DIAGNOSIS:  Same  PROCEDURE:  ARTHROSCOPIC WASHOUT OF RIGHT KNEE  SURGEON:  Sheral Apley, MD  ASSISTANT: Janace Litten, OPA-C, present and scrubbed throughout the case, critical for completion in a timely fashion, and for retraction, instrumentation, and closure.   ANESTHESIA:   gen  PREOPERATIVE INDICATIONS:  Gary Rivera is a  54 y.o. male with a diagnosis of SEPTIC RIGHT KNEE who failed conservative measures and elected for surgical management.    The risks benefits and alternatives were discussed with the patient preoperatively including but not limited to the risks of infection, bleeding, nerve injury, cardiopulmonary complications, the need for revision surgery, among others, and the patient was willing to proceed.  OPERATIVE IMPLANTS: none  OPERATIVE FINDINGS: purulent fluid, med/lat meniscal tears, chondromalacia, loose body, synovitis  BLOOD LOSS: min  COMPLICATIONS: none  TOURNIQUET TIME: none  OPERATIVE PROCEDURE:  Patient was identified in the preoperative holding area and site was marked by me He was transported to the operating theater and placed on the table in supine position taking care to pad all bony prominences. After a preincinduction time out anesthesia was induced. The right lower extremity was prepped and draped in normal sterile fashion and a pre-incision timeout was performed. preoperative antibiotics were held but he had received Vanc in the ED.   He was frankly swollen with a large effusion. I made a inferior medial and lateral arthroscopic portals as well as as well as a superior lateral outflow portal.  Purulent fluid was easily expressed from the cannula and I obtain culture of this and sent it to the lab.  I then inserted the arthroscope into the tour of the knee. 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   He had 2 moderate-sized radial tears of his lateral meniscus and I performed a debridement back to a stable meniscus.  On the medial side had a radial tear here as well and I performed a debridement of this meniscus also.  He had a loose body in the lateral gutter of his knee and I removed this came out intact.  I then irrigated his knee with a total 6 L of saline.  Wounds were then closed with simple stitch and a sterile dressing was applied.  POST OPERATIVE PLAN: WBAT, DVT px per primary team, ID c/s    This note was generated using a template and dragon dictation system. In light of that, I have reviewed the note and all aspects of it are applicable to this case. Any dictation errors are due to the computerized dictation system.

## 2014-05-18 NOTE — Anesthesia Preprocedure Evaluation (Addendum)
Johney Framehe Hospital Of Central ConnecticutGabrielaOak Forest Hospital57RobertaWesterville Medical Campus[address]<BADTETonye Becket<B TAG>TAG>ecketpi nye BecketG> re LP 8mJohney FrameChildren'S National [facility] Tonye BecketBurlingame Health Care Center D/P SnfFoothills HospitalGabriela Eves[address]  KentuckySt [facility] 3mJohney FrameHardtner Medical Center Tonye BecketRegional Medical Of San JoseJames E Van Zandt Va Medical CenterGabriela Eves19Joseph[address]  

## 2014-05-18 NOTE — ED Notes (Signed)
This nurse walked synovial fluid sample to the lab for testing

## 2014-05-18 NOTE — ED Notes (Signed)
Gave report to [employer]. Bed was switched to tele.

## 2014-05-18 NOTE — Progress Notes (Addendum)
ANTIBIOTIC CONSULT NOTE - INITIAL  Pharmacy Consult for Vancomycin Indication: r/o septic arthritis  Allergies  Allergen Reactions  . Renvela [Sevelamer] Nausea And Vomiting    Patient Measurements:    Wt: 147.4 kg Ht: 6'2"  Vital Signs: Temp: 98.4 F (36.9 C) (02/28 1406) Temp Source: Oral (02/28 1406) BP: 123/67 mmHg (02/28 1604) Pulse Rate: 86 (02/28 1604) Intake/Output from previous day:   Intake/Output from this shift:    Labs:  Recent Labs  05/18/14 1526 05/18/14 1545  WBC 9.0  --   HGB 9.9* 12.6*  PLT 131*  --   CREATININE  --  15.10*   Estimated Creatinine Clearance: 8.6 mL/min (by C-G formula based on Cr of 15.1). No results for input(s): VANCOTROUGH, VANCOPEAK, VANCORANDOM, GENTTROUGH, GENTPEAK, GENTRANDOM, TOBRATROUGH, TOBRAPEAK, TOBRARND, AMIKACINPEAK, AMIKACINTROU, AMIKACIN in the last 72 hours.   Microbiology: Recent Results (from the past 720 hour(s))  Gram stain     Status: None   Collection Time: 05/18/14  2:51 PM  Result Value Ref Range Status   Specimen Description FLUID  Final   Special Requests Normal  Final   Gram Stain   Final    CYTOSPIN SLIDE WBC PRESENT, PREDOMINANTLY PMN NO ORGANISMS SEEN    Report Status 05/18/2014 FINAL  Final    Medical History: Past Medical History  Diagnosis Date  . ESRD on hemodialysis     Started dialysis with PD in 2013 and switched to HD in spring 2014 because of fungal peritonitis.  Takes dialysis at home 5 days a week approx 3h 15 min each session.     . CHF (congestive heart failure)   . Peritoneal dialysis catheter in place   . Anemia     patient reporats that last hgb 7.9 a week ago  . Anxiety state, unspecified   . Hypertension     Assessment: 59 YOM with ESRD on home HD 5 days a week who presented to the [facility] on 2/28 with complaints of worsening R-knee swelling and pain over the past month. Fluid drawn from knee appears infected and was sent for culturing. Pharmacy consulted to start  Vancomycin along with Rocephin per MD for empiric septic arthritis coverage  Per discussion with the patient, they days he dialyzes varies each week. His last home HD session was on Friday. Renal has yet to see and it is unclear what the patient's HD schedule will be if he is admitted. Will load the patient and wait for HD to be scheduled before entering any additional doses.   Goal of Therapy:  Pre-HD Vancomycin level of 15-25 mcg/ml  Plan:  1. Vancomycin 2500 mg IV x 1 dose to load 2. No standing Vancomycin orders for now - will plan to enter maintenance doses once the patient's next HD session is scheduled. 3. Will continue to follow HD schedule/duration, culture results, LOT, and antibiotic de-escalation plans   Georgina Pillion, PharmD, BCPS Clinical Pharmacist Pager: [phone] 05/18/2014 4:58 PM      ======================  Addendum: - add Zosyn for sepsis   Plan: - Zosyn 2.25gm IV Q8H - Monitor HD schedule/tolerance, clinical course    Omar Gayden D. Laney Potash, PharmD, BCPS Pager:  [phone] 05/18/2014, 9:01 PM

## 2014-05-18 NOTE — Anesthesia Procedure Notes (Signed)
Procedure Name: Intubation Date/Time: 05/18/2014 7:12 PM Performed by: Melina Schools Pre-anesthesia Checklist: Patient identified, Emergency Drugs available, Suction available, Patient being monitored and Timeout performed Patient Re-evaluated:Patient Re-evaluated prior to inductionOxygen Delivery Method: Circle system utilized Preoxygenation: Pre-oxygenation with 100% oxygen Intubation Type: IV induction, Rapid sequence and Cricoid Pressure applied Ventilation: Nasal airway inserted- appropriate to patient size and Two handed mask ventilation required Grade View: Grade II Tube type: Oral Tube size: 8.0 mm Number of attempts: 1 Airway Equipment and Method: Stylet and Video-laryngoscopy Placement Confirmation: ETT inserted through vocal cords under direct vision,  positive ETCO2,  CO2 detector and breath sounds checked- equal and bilateral Secured at: 23 cm Tube secured with: Tape Dental Injury: Teeth and Oropharynx as per pre-operative assessment  Difficulty Due To: Difficulty was unanticipated

## 2014-05-18 NOTE — Anesthesia Postprocedure Evaluation (Signed)
  Anesthesia Post-op Note  Patient: Gary Rivera  Procedure(s) Performed: Procedure(s) (LRB): ARTHROSCOPIC WASHOUT OF RIGHT KNEE (Right)  Patient Location: PACU to ICU  Anesthesia Type: General  Level of Consciousness: sedated  Airway and Oxygen Therapy: Patient mechanically ventilated  Post-op Pain: mild  Post-op Assessment: Post-op Vital signs reviewed, Patient's Cardiovascular Status Stable, Respiratory Function Stable, Patent Airway and No signs of Nausea or vomiting  Last Vitals:  Filed Vitals:   05/18/14 2100  BP: 168/77  Pulse: 119  Temp:   Resp: 21    Post-op Vital Signs: stable   Complications: No apparent anesthesia complications

## 2014-05-18 NOTE — ED Notes (Addendum)
Gave pt ice chips with MD Hyacinth Meeker approval.

## 2014-05-18 NOTE — ED Provider Notes (Signed)
Synovial fluid Gram stain reveals no organisms, multiple PMNs, blood count over 150,000 white blood cells, reexamined, no fever, no distress, we'll discuss with orthopedics, antibiotics were ordered, her up to date guidelines, vancomycin.  Dr,. Murpy will consult Dr. York Spaniel will admit  Vida Roller, MD 05/18/14 [phone]

## 2014-05-18 NOTE — H&P (Signed)
[facility] History and Physical  Gary Rivera ZOX:[id number] DOB: [birth year] DOA: 05/18/2014  Referring physician:  PCP: [name], MD  Specialists:   Chief Complaint: L knee pain   HPI: Gary Rivera is a 54 y.o. male with PMH of ESRD on HD (home dialysis), HTN, h/o PAF, Anemia, CHF presented with L sided knee pain, swelling associated with subjective fever, chills, inability to ambulate for more than 3 weeks. He denies any trauma, no prior knee swellings; arthrocentesis performed in ED showed synovial fluid Gram stain reveals no organisms, no crystals, multiple PMNs, WBC over 150,000 white blood cells suspicious for septic arthritis; ED d/w orthopedics who is planning to take patient to surgery tonight; hospitalist is asked for evalaution, admission   Review of Systems: The patient denies anorexia, fever, weight loss,, vision loss, decreased hearing, hoarseness, chest pain, syncope, dyspnea on exertion, peripheral edema, balance deficits, hemoptysis, abdominal pain, melena, hematochezia, severe indigestion/heartburn, hematuria, incontinence, genital sores, muscle weakness, suspicious skin lesions, transient blindness, difficulty walking, depression, unusual weight change, abnormal bleeding, enlarged lymph nodes, angioedema, and breast masses.    Past Medical History  Diagnosis Date  . ESRD on hemodialysis     Started dialysis with PD in 2013 and switched to HD in spring 2014 because of fungal peritonitis.  Takes dialysis at home 5 days a week approx 3h 15 min each session.     . CHF (congestive heart failure)   . Peritoneal dialysis catheter in place   . Anemia     patient reporats that last hgb 7.9 a week ago  . Anxiety state, unspecified   . Hypertension    Past Surgical History  Procedure Laterality Date  . Appendectomy    . Peritoneal catheter insertion    . Tee without cardioversion N/A 07/11/2013    Procedure: TRANSESOPHAGEAL ECHOCARDIOGRAM (TEE);  Surgeon: Pricilla Riffle, MD;  Location[facility] ENDOSCOPY;  Service: Cardiovascular;  Laterality: N/A;  . Cardioversion N/A 07/11/2013    Procedure: CARDIOVERSION;  Surgeon: Pricilla Riffle, MD;  Location: [facility] ENDOSCOPY;  Service: Cardiovascular;  Laterality: N/A;   Social History:  reports that he has never smoked. He has never used smokeless tobacco. He reports that he does not drink alcohol or use illicit drugs. Home;  where does patient live--home, ALF, SNF? and with whom if at home? Yes;  Can patient participate in ADLs?  Allergies  Allergen Reactions  . Renvela [Sevelamer] Nausea And Vomiting    Family History  Problem Relation Age of Onset  . Diabetes Mother   . Hypertension Mother   . Hypertension Sister   . Asthma Sister     (be sure to complete)  Prior to Admission medications   Medication Sig Start Date End Date Taking? Authorizing Provider  calcitRIOL (ROCALTROL) 0.5 MCG capsule Take 0.5 mcg by mouth daily.   Yes Historical Provider, MD  clonazePAM (KLONOPIN) 0.5 MG tablet Take 1 mg by mouth 2 (two) times daily as needed for anxiety.    Yes Historical Provider, MD  lanthanum (FOSRENOL) 1000 MG chewable tablet Chew 1,000-2,000 mg by mouth 3 (three) times daily with meals. Takes 2 tablets with meals and 1 tablet with snacks.   Yes Historical Provider, MD  metoCLOPramide (REGLAN) 10 MG tablet Take 10 mg by mouth daily. 05/03/14  Yes Historical Provider, MD  omeprazole (PRILOSEC) 20 MG capsule Take 20 mg by mouth daily.  02/24/14  Yes Historical Provider, MD  Oxycodone HCl 10 MG TABS Take 10 mg by mouth  3 (three) times daily as needed.  05/01/14  Yes Historical Provider, MD  ursodiol (ACTIGALL) 300 MG capsule Take 1 capsule (300 mg total) by mouth 3 (three) times daily. 07/22/13  Yes Almond Lint, MD  albuterol (PROVENTIL HFA;VENTOLIN HFA) 108 (90 BASE) MCG/ACT inhaler Inhale 1 puff into the lungs every 6 (six) hours as needed for wheezing or shortness of breath.    Historical Provider, MD   amoxicillin-clavulanate (AUGMENTIN) 875-125 MG per tablet Take 1 tablet by mouth 2 (two) times daily. 05/11/14 05/18/14  Historical Provider, MD  oxyCODONE-acetaminophen (PERCOCET/ROXICET) 5-325 MG per tablet Take 1-2 tablets by mouth every 4 (four) hours as needed (severe pain). Patient not taking: Reported on 05/18/2014 04/20/14   Nelia Shi, MD  warfarin (COUMADIN) 5 MG tablet Take 1 tablet (5 mg total) by mouth daily. Patient not taking: Reported on 05/18/2014 01/12/14   Gwyneth Sprout, MD   Physical Exam: Filed Vitals:   05/18/14 1750  BP: 130/72  Pulse: 84  Temp:   Resp: 18     General:  alert  Eyes: EOM-I  ENT: no oral ulcers   Neck: supple  Cardiovascular: s1,s2 rrr  Respiratory: CTA BL, no wheezing   Abdomen: soft, nt,nd   Skin: no multiple prior post op scars   Musculoskeletal: mild nonpitting edema legs   Psychiatric: no hallucinations   Neurologic: CN 2-12 intact, motor 5/5 BL  Labs on Admission:  Basic Metabolic Panel:  Recent Labs Lab 05/18/14 1545  NA 137  K 4.9  CL 100  GLUCOSE 107*  BUN 83*  CREATININE 15.10*   Liver Function Tests: No results for input(s): AST, ALT, ALKPHOS, BILITOT, PROT, ALBUMIN in the last 168 hours. No results for input(s): LIPASE, AMYLASE in the last 168 hours. No results for input(s): AMMONIA in the last 168 hours. CBC:  Recent Labs Lab 05/18/14 1526 05/18/14 1545  WBC 9.0  --   NEUTROABS 7.6  --   HGB 9.9* 12.6*  HCT 32.7* 37.0*  MCV 93.7  --   PLT 131*  --    Cardiac Enzymes: No results for input(s): CKTOTAL, CKMB, CKMBINDEX, TROPONINI in the last 168 hours.  BNP (last 3 results) No results for input(s): BNP in the last 8760 hours.  ProBNP (last 3 results) No results for input(s): PROBNP in the last 8760 hours.  CBG:  Recent Labs Lab 05/18/14 1514  GLUCAP 99    Radiological Exams on Admission: No results found.  EKG: Independently reviewed.   Assessment/Plan Active Problems:    Septic arthritis  54 y.o. male with PMH of ESRD on HD (home dialysis), HTN, Anemia, h/o PAF, CHF presented with L sided knee pain, swelling associated with subjective fever, chills -admitted with probable septic arthritis   1. L knee pain, suspicious for septic arthritis; ED d/w orthopedics, awaiting evaluation;   -will obtain pre-op ECG, CXR; patient denies any cardiopulmonary symptoms, able to do > METS at baseline; but he is at increased risk for fluid overload due to ESRD, CHF severe TR; will need close periop monitoring; HD as needed  2. ESRD (home HD); consulted nephrology for management  3. H/o PAF (Per patient: he was on coumadin for short time, then he stopped after discussions with his cardiologist Dr Revonda Humphrey due to problems with his dialysis); will obtain preop ECG; patient did not want to continue anticoagulation  4. H/o CHF, echo (06/2013): LVEF 25-30%, LVH, dilated LA, RA, RV, moe-severe pulmonary HTN -on repeat echo LV improved to 45%  at [facility]; patient denies cardiopulmonary symptoms, clinicallu euvolemic, but at risk for fluid overload or arhythmia periop; will monitor closely  5. Probable undiagnosed OSA: needs outpatient polysomnography; will obtain CXR; will use BiPAP as needed  Plan of care discussed at length with patient, his family at the bedside;  Ortho, nephrology;   if consultant consulted, please document name and whether formally or informally consulted  Code Status: full (must indicate code status--if unknown or must be presumed, indicate so) Family Communication: d/w patient, his family at the bedside  (indicate person spoken with, if applicable, with phone number if by telephone) Disposition Plan: pend clinical improvement  (indicate anticipated LOS)  Time spent: >45 minutes   Esperanza Sheets [facility] Pager [phone]  If 7PM-7AM, please contact night-coverage [email or web address] Password [facility] 05/18/2014, 6:05 PM

## 2014-05-18 NOTE — ED Notes (Signed)
Placed restricted extremity bracelet on pt's LT arm.

## 2014-05-18 NOTE — ED Notes (Signed)
Per EMS pt c/o R knee pain. Pt is on dialysis at home and sts he did not get to do his dialysis today, last day dialysis Friday. Pt pain 4/10. Pt is on 2L O2 at home

## 2014-05-18 NOTE — Progress Notes (Signed)
RT called by PACU to bring vent. Vent brought and set up for patient. Vent setting per MD were PS 12 PEEP 5 FIO2 40%. Sats were 98% RR 24.

## 2014-05-18 NOTE — ED Notes (Signed)
Attempted report x1. 

## 2014-05-18 NOTE — ED Notes (Addendum)
Dr plunkett aspirated 50 cc of yellow cloudy synovial fluid. Pt sleeping thru the procedure. Family reports he took pain pill before coming to the hospital. He awakens easily

## 2014-05-18 NOTE — ED Notes (Signed)
Attempted report to 5N x2

## 2014-05-18 NOTE — Consult Note (Signed)
ORTHOPAEDIC CONSULTATION  REQUESTING PHYSICIAN: Kinnie Feil, MD  Chief Complaint: right septic knee  HPI: Gary Rivera is a 54 y.o. male who complains of 1 mo of pain in his R knee. He has had injection of steroids and it briefly improved but is much worse now. Aspiration showed increased White cells. He is up to date on his self administered dialysis   Past Medical History  Diagnosis Date  . ESRD on hemodialysis     Started dialysis with PD in 2013 and switched to HD in spring 2014 because of fungal peritonitis.  Takes dialysis at home 5 days a week approx 3h 15 min each session.     . CHF (congestive heart failure)   . Peritoneal dialysis catheter in place   . Anemia     patient reporats that last hgb 7.9 a week ago  . Anxiety state, unspecified   . Hypertension    Past Surgical History  Procedure Laterality Date  . Appendectomy    . Peritoneal catheter insertion    . Tee without cardioversion N/A 07/11/2013    Procedure: TRANSESOPHAGEAL ECHOCARDIOGRAM (TEE);  Surgeon: Fay Records, MD;  Location: [facility] ENDOSCOPY;  Service: Cardiovascular;  Laterality: N/A;  . Cardioversion N/A 07/11/2013    Procedure: CARDIOVERSION;  Surgeon: Fay Records, MD;  Location[facility] ENDOSCOPY;  Service: Cardiovascular;  Laterality: N/A;   History   Social History  . Marital Status: Single    Spouse Name: N/A  . Number of Children: 0  . Years of Education: N/A   Occupational History  . disabled    Social History Main Topics  . Smoking status: Never Smoker   . Smokeless tobacco: Never Used  . Alcohol Use: No  . Drug Use: No  . Sexual Activity: Not on file   Other Topics Concern  . None   Social History Narrative   Family History  Problem Relation Age of Onset  . Diabetes Mother   . Hypertension Mother   . Hypertension Sister   . Asthma Sister    Allergies  Allergen Reactions  . Renvela [Sevelamer] Nausea And Vomiting   Prior to Admission medications   Medication Sig  Start Date End Date Taking? Authorizing Provider  calcitRIOL (ROCALTROL) 0.5 MCG capsule Take 0.5 mcg by mouth daily.   Yes Historical Provider, MD  clonazePAM (KLONOPIN) 0.5 MG tablet Take 1 mg by mouth 2 (two) times daily as needed for anxiety.    Yes Historical Provider, MD  lanthanum (FOSRENOL) 1000 MG chewable tablet Chew 1,000-2,000 mg by mouth 3 (three) times daily with meals. Takes 2 tablets with meals and 1 tablet with snacks.   Yes Historical Provider, MD  metoCLOPramide (REGLAN) 10 MG tablet Take 10 mg by mouth daily. 05/03/14  Yes Historical Provider, MD  omeprazole (PRILOSEC) 20 MG capsule Take 20 mg by mouth daily.  02/24/14  Yes Historical Provider, MD  Oxycodone HCl 10 MG TABS Take 10 mg by mouth 3 (three) times daily as needed.  05/01/14  Yes Historical Provider, MD  ursodiol (ACTIGALL) 300 MG capsule Take 1 capsule (300 mg total) by mouth 3 (three) times daily. 07/22/13  Yes Stark Klein, MD  albuterol (PROVENTIL HFA;VENTOLIN HFA) 108 (90 BASE) MCG/ACT inhaler Inhale 1 puff into the lungs every 6 (six) hours as needed for wheezing or shortness of breath.    Historical Provider, MD  amoxicillin-clavulanate (AUGMENTIN) 875-125 MG per tablet Take 1 tablet by mouth 2 (two) times daily.  05/11/14 05/18/14  Historical Provider, MD  oxyCODONE-acetaminophen (PERCOCET/ROXICET) 5-325 MG per tablet Take 1-2 tablets by mouth every 4 (four) hours as needed (severe pain). Patient not taking: Reported on 05/18/2014 04/20/14   Dot Lanes, MD  warfarin (COUMADIN) 5 MG tablet Take 1 tablet (5 mg total) by mouth daily. Patient not taking: Reported on 05/18/2014 01/12/14   Blanchie Dessert, MD   No results found.  Positive ROS: All other systems have been reviewed and were otherwise negative with the exception of those mentioned in the HPI and as above.  Labs cbc  Recent Labs  05/18/14 1526 05/18/14 1545  WBC 9.0  --   HGB 9.9* 12.6*  HCT 32.7* 37.0*  PLT 131*  --     Labs inflam No results  for input(s): CRP in the last 72 hours.  Invalid input(s): ESR  Labs coag No results for input(s): INR, PTT in the last 72 hours.  Invalid input(s): PT   Recent Labs  05/18/14 1545  NA 137  K 4.9  CL 100  GLUCOSE 107*  BUN 83*  CREATININE 15.10*    Physical Exam: Filed Vitals:   05/18/14 1750  BP: 130/72  Pulse: 84  Temp:   Resp: 18   General: Alert, no acute distress Cardiovascular: No pedal edema Respiratory: No cyanosis, no use of accessory musculature GI: No organomegaly, abdomen is soft and non-tender Skin: No lesions in the area of chief complaint other than those listed below in MSK exam.  Neurologic: Sensation intact distally Psychiatric: Patient is competent for consent with normal mood and affect Lymphatic: No axillary or cervical lymphadenopathy  MUSCULOSKELETAL:  RLE: pain and erythema and swelling. Distally gross sensation intact.  Other extremities are atraumatic with painless ROM and NVI.  Assessment: R septic knee  Plan: Arthroscopic I&D Will need ID c/s and long term Abx Stephannie Peters, D, MD Cell [phone]   05/18/2014 6:53 PM

## 2014-05-18 NOTE — ED Notes (Signed)
Internal medicine MD at bedside

## 2014-05-18 NOTE — ED Notes (Signed)
OR called and report given.

## 2014-05-18 NOTE — Progress Notes (Signed)
Shift Event:  Request received from Dr. Eulah Pont to transfer pt to SDU for closer monitoring. Placed order for transfer.   Illa Level [facility]

## 2014-05-19 ENCOUNTER — Observation Stay (HOSPITAL_COMMUNITY): Payer: Medicare Other

## 2014-05-19 ENCOUNTER — Encounter (HOSPITAL_COMMUNITY): Payer: Self-pay | Admitting: Orthopedic Surgery

## 2014-05-19 DIAGNOSIS — I5022 Chronic systolic (congestive) heart failure: Secondary | ICD-10-CM

## 2014-05-19 DIAGNOSIS — I481 Persistent atrial fibrillation: Secondary | ICD-10-CM

## 2014-05-19 DIAGNOSIS — N186 End stage renal disease: Secondary | ICD-10-CM

## 2014-05-19 DIAGNOSIS — Z992 Dependence on renal dialysis: Secondary | ICD-10-CM

## 2014-05-19 DIAGNOSIS — M009 Pyogenic arthritis, unspecified: Principal | ICD-10-CM

## 2014-05-19 DIAGNOSIS — I10 Essential (primary) hypertension: Secondary | ICD-10-CM

## 2014-05-19 DIAGNOSIS — I359 Nonrheumatic aortic valve disorder, unspecified: Secondary | ICD-10-CM

## 2014-05-19 DIAGNOSIS — R7881 Bacteremia: Secondary | ICD-10-CM

## 2014-05-19 DIAGNOSIS — D649 Anemia, unspecified: Secondary | ICD-10-CM | POA: Diagnosis present

## 2014-05-19 LAB — CBC
HEMATOCRIT: 30.9 % — AB (ref 39.0–52.0)
HEMOGLOBIN: 9.5 g/dL — AB (ref 13.0–17.0)
MCH: 29.5 pg (ref 26.0–34.0)
MCHC: 30.7 g/dL (ref 30.0–36.0)
MCV: 96 fL (ref 78.0–100.0)
Platelets: 168 10*3/uL (ref 150–400)
RBC: 3.22 MIL/uL — ABNORMAL LOW (ref 4.22–5.81)
RDW: 14.9 % (ref 11.5–15.5)
WBC: 9.6 10*3/uL (ref 4.0–10.5)

## 2014-05-19 LAB — BASIC METABOLIC PANEL
Anion gap: 17 — ABNORMAL HIGH (ref 5–15)
BUN: 90 mg/dL — AB (ref 6–23)
CO2: 21 mmol/L (ref 19–32)
Calcium: 8.1 mg/dL — ABNORMAL LOW (ref 8.4–10.5)
Chloride: 98 mmol/L (ref 96–112)
Creatinine, Ser: 16.95 mg/dL — ABNORMAL HIGH (ref 0.50–1.35)
GFR calc Af Amer: 3 mL/min — ABNORMAL LOW (ref >90)
GFR, EST NON AFRICAN AMERICAN: 3 mL/min — AB (ref >90)
Glucose, Bld: 123 mg/dL — ABNORMAL HIGH (ref 70–99)
Potassium: 7.1 mmol/L (ref 3.5–5.1)
Sodium: 136 mmol/L (ref 135–145)

## 2014-05-19 LAB — GLUCOSE, SYNOVIAL FLUID: Glucose, Synovial Fluid: <20 mg/dL

## 2014-05-19 LAB — MRSA PCR SCREENING: MRSA by PCR: NEGATIVE

## 2014-05-19 LAB — PROTIME-INR
INR: 1.11 (ref 0.00–1.49)
Prothrombin Time: 14.5 seconds (ref 11.6–15.2)

## 2014-05-19 MED ORDER — COUMADIN BOOK
Freq: Once | Status: DC
  Filled 2014-05-19: qty 1

## 2014-05-19 MED ORDER — PENTAFLUOROPROP-TETRAFLUOROETH EX AERO: 1.0000 "application " | INHALATION_SPRAY | CUTANEOUS | Status: DC | PRN

## 2014-05-19 MED ORDER — DEXTROSE 5 % IV SOLN
2.0000 g | INTRAVENOUS | Status: DC
  Filled 2014-05-19: qty 2

## 2014-05-19 MED ORDER — SODIUM CHLORIDE 0.9 % IV SOLN: 100.0000 mL | INTRAVENOUS | Status: DC | PRN

## 2014-05-19 MED ORDER — OXYCODONE HCL 5 MG PO TABS
5.0000 mg | ORAL_TABLET | Freq: Three times a day (TID) | ORAL | Status: DC | PRN
  Administered 2014-05-19 – 2014-05-20 (×3): 10 mg via ORAL
  Filled 2014-05-19 (×3): qty 2

## 2014-05-19 MED ORDER — HEPARIN SODIUM (PORCINE) 1000 UNIT/ML DIALYSIS
1000.0000 [IU] | INTRAMUSCULAR | Status: DC | PRN
  Filled 2014-05-19: qty 1

## 2014-05-19 MED ORDER — CETYLPYRIDINIUM CHLORIDE 0.05 % MT LIQD
7.0000 mL | Freq: Two times a day (BID) | OROMUCOSAL | Status: DC
  Administered 2014-05-19 – 2014-05-20 (×4): 7 mL via OROMUCOSAL

## 2014-05-19 MED ORDER — DARBEPOETIN ALFA 40 MCG/0.4ML IJ SOSY
40.0000 ug | PREFILLED_SYRINGE | INTRAMUSCULAR | Status: DC
  Filled 2014-05-19: qty 0.4

## 2014-05-19 MED ORDER — DEXTROSE 5 % IV SOLN
2.0000 g | Freq: Once | INTRAVENOUS | Status: AC
  Administered 2014-05-19: 2 g via INTRAVENOUS
  Filled 2014-05-19 (×2): qty 2

## 2014-05-19 MED ORDER — METOPROLOL TARTRATE 12.5 MG HALF TABLET
12.5000 mg | ORAL_TABLET | Freq: Two times a day (BID) | ORAL | Status: DC
  Filled 2014-05-19 (×6): qty 1

## 2014-05-19 MED ORDER — WARFARIN VIDEO: Freq: Once | Status: DC

## 2014-05-19 MED ORDER — WARFARIN SODIUM 7.5 MG PO TABS
7.5000 mg | ORAL_TABLET | Freq: Once | ORAL | Status: DC
  Filled 2014-05-19: qty 1

## 2014-05-19 MED ORDER — NEPRO/CARBSTEADY PO LIQD
237.0000 mL | ORAL | Status: DC | PRN
  Filled 2014-05-19: qty 237

## 2014-05-19 MED ORDER — LIDOCAINE HCL (PF) 1 % IJ SOLN: 5.0000 mL | INTRAMUSCULAR | Status: DC | PRN

## 2014-05-19 MED ORDER — LIDOCAINE-PRILOCAINE 2.5-2.5 % EX CREA: 1.0000 "application " | TOPICAL_CREAM | CUTANEOUS | Status: DC | PRN

## 2014-05-19 MED ORDER — WARFARIN - PHARMACIST DOSING INPATIENT: Freq: Every day | Status: DC

## 2014-05-19 MED ORDER — ALTEPLASE 2 MG IJ SOLR
2.0000 mg | Freq: Once | INTRAMUSCULAR | Status: AC | PRN
  Filled 2014-05-19: qty 2

## 2014-05-19 MED ORDER — VANCOMYCIN HCL IN DEXTROSE 1-5 GM/200ML-% IV SOLN
1000.0000 mg | INTRAVENOUS | Status: DC
  Administered 2014-05-19 – 2014-05-21 (×2): 1000 mg via INTRAVENOUS
  Filled 2014-05-19 (×3): qty 200

## 2014-05-19 NOTE — Progress Notes (Signed)
     Subjective:  Patient is intubated but is able to communicate through writing.  His pain is well controlled at this time.  S/P I&D of the right knee on 05/18/14.  He was started on vancomysin in the ED.  Objective:   VITALS:   Filed Vitals:   05/19/14 0945 05/19/14 1000 05/19/14 1006 05/19/14 1015  BP: 139/68 160/64 160/64 165/70  Pulse: 83 79 80 83  Temp:      TempSrc:      Resp: 22 20 22 23   Height:      Weight:      SpO2:   96%     Neurologically intact ABD soft Neurovascular intact Sensation intact distally Intact pulses distally Incision: dressing C/D/I  Lab Results  Component Value Date   WBC 9.6 05/19/2014   HGB 9.5* 05/19/2014   HCT 30.9* 05/19/2014   MCV 96.0 05/19/2014   PLT 168 05/19/2014   BMET    Component Value Date/Time   NA 136 05/19/2014 0527   K 7.1* 05/19/2014 0527   CL 98 05/19/2014 0527   CO2 21 05/19/2014 0527   GLUCOSE 123* 05/19/2014 0527   BUN 90* 05/19/2014 0527   CREATININE 16.95* 05/19/2014 0527   CALCIUM 8.1* 05/19/2014 0527   CALCIUM 8.0* 12/04/2009 0455   GFRNONAA 3* 05/19/2014 0527   GFRAA 3* 05/19/2014 0527     Assessment/Plan: 1 Day Post-Op   Principal Problem:   Septic arthritis Active Problems:   Chronic systolic congestive heart failure, NYHA class 1   HTN (hypertension)   Acute respiratory failure with hypercapnia   Atrial flutter with RVR   Up with therapy WBAT in the RLE Will consult ID for antibiotic management   Avien Taha Marie 05/19/2014, 10:18 AM

## 2014-05-19 NOTE — Consult Note (Signed)
[facility]    Date of Admission:  05/18/2014           Day 2 vancomycin        Day 2 piperacillin tazobactam       Reason for Consult: Septic arthritis of right knee    Referring Physician: Dr. Renaye Rakers  Principal Problem:   Septic arthritis Active Problems:   Chronic systolic congestive heart failure, NYHA class 1   HTN (hypertension)   Atrial flutter with RVR   ESRD on hemodialysis   Normocytic anemia   . antiseptic oral rinse  7 mL Mouth Rinse BID  . calcitRIOL  0.5 mcg Oral Daily  . coumadin book   Does not apply Once  . darbepoetin (ARANESP) injection - DIALYSIS  40 mcg Intravenous Q Mon-HD  . heparin  5,000 Units Subcutaneous 3 times per day  . lanthanum  1,000-2,000 mg Oral TID WC  . metoprolol tartrate  12.5 mg Oral BID  . pantoprazole  40 mg Oral Daily  . piperacillin-tazobactam (ZOSYN)  IV  2.25 g Intravenous 3 times per day  . sodium chloride  3 mL Intravenous Q12H  . ursodiol  300 mg Oral TID  . vancomycin  1,000 mg Intravenous Q M,W,F-HD  . warfarin   Does not apply Once  . Warfarin - Pharmacist Dosing Inpatient   Does not apply q1800    Recommendations: 1. Continue vancomycin 2. Change piperacillin tazobactam to cefepime 3. Await results of operative cultures   Assessment: He has septic arthritis of his right knee. I do not see any evidence of infection of his left arm fistula or other evidence of systemic infection. I do not feel he needs the anaerobic coverage afforded by piperacillin tazobactam. I will change it to cefepime and continue vancomycin pending final cultures. Given that he has been on antibiotics for the past month cultures could be falsely negative.    HPI: Gary Rivera is a 54 y.o. male with end-stage renal disease on hemodialysis. About one month ago he had an episode of fever and bronchitis in his primary care physician put him on some oral antibiotic. His fever and bronchitis resolved but  over the last month he has had increasingly severe right knee pain and swelling. He was recently put on amoxicillin clavulanate. He was seen in the emergency department on 04/20/2014 and was felt to have severe osteoarthritis. An orthopedic referral was made but his pain became so severe he returned to the emergency department and was admitted. Arthrocentesis revealed 151,000 white blood cells with 99% segmented neutrophils. Synovial fluid Gram stain was negative. He underwent arthroscopic washout of his knee yesterday. Operative Gram stain is also negative and cultures are pending. He is feeling much better today.   Review of Systems: Constitutional: positive for fevers and malaise, negative for anorexia, chills, sweats and weight loss Eyes: negative Ears, nose, mouth, throat, and face: negative Respiratory: as noted in history of present illness Cardiovascular: negative Gastrointestinal: negative Genitourinary:negative  Past Medical History  Diagnosis Date  . ESRD on hemodialysis     Started dialysis with PD in 2013 and switched to HD in spring 2014 because of fungal peritonitis.  Takes dialysis at home 5 days a week approx 3h 15 min each session.     . CHF (congestive heart failure)   . Peritoneal dialysis catheter in place   . Anemia     patient reporats that last  hgb 7.9 a week ago  . Anxiety state, unspecified   . Hypertension     History  Substance Use Topics  . Smoking status: Never Smoker   . Smokeless tobacco: Never Used  . Alcohol Use: No    Family History  Problem Relation Age of Onset  . Diabetes Mother   . Hypertension Mother   . Hypertension Sister   . Asthma Sister    Allergies  Allergen Reactions  . Renvela [Sevelamer] Nausea And Vomiting    OBJECTIVE: Blood pressure 137/80, pulse 87, temperature 98 F (36.7 C), temperature source Oral, resp. rate 17, height  (1.88 m), weight 335 lb 1.6 oz (152 kg), SpO2 97 %. General: he is currently on  hemodialysis via a left arm fistula Skin: no rash Lungs: clear Cor: distant but regular S1 and S2 with a 1/6 systolic murmur Abdomen: obese, soft and nontender Right leg in Ace wrap  Lab Results Lab Results  Component Value Date   WBC 9.6 05/19/2014   HGB 9.5* 05/19/2014   HCT 30.9* 05/19/2014   MCV 96.0 05/19/2014   PLT 168 05/19/2014    Lab Results  Component Value Date   CREATININE 16.95* 05/19/2014   BUN 90* 05/19/2014   NA 136 05/19/2014   K 7.1* 05/19/2014   CL 98 05/19/2014   CO2 21 05/19/2014    Lab Results  Component Value Date   ALT 10 01/12/2014   AST 17 01/12/2014   ALKPHOS 59 01/12/2014   BILITOT 0.4 01/12/2014     Microbiology: Recent Results (from the past 240 hour(s))  Body fluid culture     Status: None (Preliminary result)   Collection Time: 05/18/14  2:51 PM  Result Value Ref Range Status   Specimen Description FLUID  Final   Special Requests Normal  Final   Gram Stain   Final    CYTOSPIN SLIDE WBC PRESENT, PREDOMINANTLY PMN NO ORGANISMS SEEN Performed at [facility] Performed at [facility]    Culture NO GROWTH Performed at [facility]   Final   Report Status PENDING  Incomplete  Gram stain     Status: None   Collection Time: 05/18/14  2:51 PM  Result Value Ref Range Status   Specimen Description FLUID  Final   Special Requests Normal  Final   Gram Stain   Final    CYTOSPIN SLIDE WBC PRESENT, PREDOMINANTLY PMN NO ORGANISMS SEEN    Report Status 05/18/2014 FINAL  Final  Anaerobic culture     Status: None (Preliminary result)   Collection Time: 05/18/14  8:25 PM  Result Value Ref Range Status   Specimen Description FLUID SYNOVIAL RIGHT KNEE  Final   Special Requests PATIENT ON FOLLOWING ROCEPHIN,VANCOMYCIN  Final   Gram Stain   Final    ABUNDANT WBC PRESENT, PREDOMINANTLY PMN NO ORGANISMS SEEN Performed at [facility]    Culture   Final    NO ANAEROBES ISOLATED; CULTURE IN PROGRESS FOR 5  DAYS Performed at [facility]    Report Status PENDING  Incomplete  Body fluid culture     Status: None (Preliminary result)   Collection Time: 05/18/14  8:25 PM  Result Value Ref Range Status   Specimen Description SYNOVIAL RIGHT KNEE  Final   Special Requests ROCEPHIN,VANCOMYCIN  Final   Gram Stain   Final    ABUNDANT WBC PRESENT, PREDOMINANTLY PMN NO ORGANISMS SEEN Performed at [facility] PENDING  Incomplete   Report Status PENDING  Incomplete  MRSA PCR Screening     Status: None   Collection Time: 05/18/14 10:38 PM  Result Value Ref Range Status   MRSA by PCR NEGATIVE NEGATIVE Final    Comment:        The GeneXpert MRSA Assay (FDA approved for NASAL specimens only), is one component of a comprehensive MRSA colonization surveillance program. It is not intended to diagnose MRSA infection nor to guide or monitor treatment for MRSA infections.     Cliffton Asters, MD [facility] [phone] pager   [phone] cell 05/19/2014, 12:14 PM

## 2014-05-19 NOTE — Progress Notes (Signed)
UR Completed.  [phone]  

## 2014-05-19 NOTE — Progress Notes (Signed)
CRITICAL VALUE ALERT  Critical value received:  K 7.1   Date of notification:  05-19-14  Time of notification:  0705  Critical value read back:yes  Nurse who received alert:  Verne Spurr  MD notified (1st page):  [employer]  Time of first page:  0710 & 0720  MD notified (2nd page):  Time of second page:  Responding MD:  [employer]  Time MD responded:  0710 & 0720

## 2014-05-19 NOTE — Progress Notes (Signed)
ANTICOAGULATION CONSULT NOTE - Initial Consult  Pharmacy Consult for Warfarin Indication: atrial fibrillation  Allergies  Allergen Reactions  . Renvela [Sevelamer] Nausea And Vomiting    Patient Measurements: Height:  (188 cm) Weight: (!) 335 lb 1.6 oz (152 kg) IBW/kg (Calculated) : 82.2  Vital Signs: Temp: 97.7 F (36.5 C) (02/29 0830) Temp Source: Axillary (02/29 0830) BP: 160/64 mmHg (02/29 1000) Pulse Rate: 79 (02/29 1000)  Labs:  Recent Labs  05/18/14 1526 05/18/14 1545 05/18/14 2202 05/19/14 0315 05/19/14 0527  HGB 9.9* 12.6* 10.4* 9.5*  --   HCT 32.7* 37.0* 33.9* 30.9*  --   PLT 131*  --  141* 168  --   CREATININE  --  15.10* 16.43*  --  16.95*    Estimated Creatinine Clearance: 7.8 mL/min (by C-G formula based on Cr of 16.95).   Medical History: Past Medical History  Diagnosis Date  . ESRD on hemodialysis     Started dialysis with PD in 2013 and switched to HD in spring 2014 because of fungal peritonitis.  Takes dialysis at home 5 days a week approx 3h 15 min each session.     . CHF (congestive heart failure)   . Peritoneal dialysis catheter in place   . Anemia     patient reporats that last hgb 7.9 a week ago  . Anxiety state, unspecified   . Hypertension     Medications:  Prescriptions prior to admission  Medication Sig Dispense Refill Last Dose  . calcitRIOL (ROCALTROL) 0.5 MCG capsule Take 0.5 mcg by mouth daily.   05/18/2014 at Unknown time  . clonazePAM (KLONOPIN) 0.5 MG tablet Take 1 mg by mouth 2 (two) times daily as needed for anxiety.    05/18/2014 at Unknown time  . lanthanum (FOSRENOL) 1000 MG chewable tablet Chew 1,000-2,000 mg by mouth 3 (three) times daily with meals. Takes 2 tablets with meals and 1 tablet with snacks.   05/18/2014 at Unknown time  . metoCLOPramide (REGLAN) 10 MG tablet Take 10 mg by mouth daily.  11 05/18/2014 at Unknown time  . omeprazole (PRILOSEC) 20 MG capsule Take 20 mg by mouth daily.   2 05/18/2014 at Unknown  time  . Oxycodone HCl 10 MG TABS Take 10 mg by mouth 3 (three) times daily as needed.   0 05/18/2014 at Unknown time  . ursodiol (ACTIGALL) 300 MG capsule Take 1 capsule (300 mg total) by mouth 3 (three) times daily. 60 capsule 3 05/17/2014 at Unknown time  . albuterol (PROVENTIL HFA;VENTOLIN HFA) 108 (90 BASE) MCG/ACT inhaler Inhale 1 puff into the lungs every 6 (six) hours as needed for wheezing or shortness of breath.   01/11/2014 at Unknown time  . [EXPIRED] amoxicillin-clavulanate (AUGMENTIN) 875-125 MG per tablet Take 1 tablet by mouth 2 (two) times daily.  0 Completed Course at Unknown time  . oxyCODONE-acetaminophen (PERCOCET/ROXICET) 5-325 MG per tablet Take 1-2 tablets by mouth every 4 (four) hours as needed (severe pain). (Patient not taking: Reported on 05/18/2014) 30 tablet 0 Not Taking at Unknown time  . warfarin (COUMADIN) 5 MG tablet Take 1 tablet (5 mg total) by mouth daily. (Patient not taking: Reported on 05/18/2014) 7 tablet 0 Not Taking at Unknown time   Scheduled:  . calcitRIOL  0.5 mcg Oral Daily  . darbepoetin (ARANESP) injection - DIALYSIS  40 mcg Intravenous Q Mon-HD  . heparin  5,000 Units Subcutaneous 3 times per day  . lanthanum  1,000-2,000 mg Oral TID WC  . metoprolol  tartrate  12.5 mg Oral BID  . pantoprazole  40 mg Oral Daily  . piperacillin-tazobactam (ZOSYN)  IV  2.25 g Intravenous 3 times per day  . sodium chloride  3 mL Intravenous Q12H  . ursodiol  300 mg Oral TID   Infusions:    Assessment: 54yo male with history of ESRD, CHF, and Afib here for septic knee. Pharmacy is consulted to dose warfarin for atrial fibrillation. INR at baseline is 1.11, Hgb 9.5, Plt 168.  Pt has previously been on warfarin but as of recently has not been taking.  Goal of Therapy:  INR 2-3 Monitor platelets by anticoagulation protocol: Yes   Plan:  Warfarin 7.5mg  tonight x1 Daily INR/CBC Monitor s/sx of bleeding Will re-educate patient on warfarin  Arlean Hopping. Newman Pies,  PharmD Clinical Pharmacist Pager [phone] 05/19/2014,10:11 AM

## 2014-05-19 NOTE — Procedures (Signed)
I was present at this session.  I have reviewed the session itself and made appropriate changes.  Vol xs, ^ K, ^ solute, extend HD, lower K, vol.  Unfortunately both needles up within 2 FB, so recirc.  Mikah Rottinghaus L 2/29/20169:11 AM

## 2014-05-19 NOTE — Procedures (Signed)
Extubation Procedure Note  Patient Details:   Name: Gary Rivera DOB: 11-16-1960 MRN: [id number]   Airway Documentation:     Evaluation  O2 sats: stable throughout Complications: No apparent complications Patient did tolerate procedure well. Bilateral Breath Sounds: Rhonchi   Yes  Pt extubated per Md order, placed on 4lpm tolerating well. Vitals as noted  Melanee Spry 05/19/2014, 10:12 AM

## 2014-05-19 NOTE — Progress Notes (Signed)
Echocardiogram 2D Echocardiogram has been performed.  Leta Jungling M 05/19/2014, 3:13 PM

## 2014-05-19 NOTE — Progress Notes (Signed)
PULMONARY / CRITICAL CARE MEDICINE   Name: Gary Rivera MRN: [id number] DOB: 07/13/1960    ADMISSION DATE:  05/18/2014 CONSULTATION DATE:  2/28  REFERRING MD :  Rose/anesthesia   INITIAL PRESENTATION:  38 M with ESRD and CHF, s/p septic knee washout 2/28 evening. Could not be extubated due to prolonged somnolence post op. Transferred from PACU to ICU intubated. PCCM to asked ot assume care while in ICU. Initially admitted by [facility] service  STUDIES:  2/28 L knee plain Xray: Negative for fracture. Moderate joint effusion. Mild medial compartment degenerative disease  SIGNIFICANT EVENTS: 2/28 admitted to [facility] service with CC of R knee pain and swelling 2/28 R knee washout, could not be extubated   SUBJECTIVE: ready to be extubated, receiving dialysis  VITAL SIGNS: Temp:  [97 F (36.1 C)-98.4 F (36.9 C)] 97.7 F (36.5 C) (02/29 0830) Pulse Rate:  [71-119] 91 (02/29 0930) Resp:  [15-29] 25 (02/29 0930) BP: (105-191)/(48-85) 181/60 mmHg (02/29 0930) SpO2:  [83 %-100 %] 92 % (02/29 0900) FiO2 (%):  [40 %] 40 % (02/29 0900) Weight:  [152 kg (335 lb 1.6 oz)] 152 kg (335 lb 1.6 oz) (02/29 0830) HEMODYNAMICS:   VENTILATOR SETTINGS: Vent Mode:  [-] PSV;CPAP FiO2 (%):  [40 %] 40 % Set Rate:  [15 bmp] 15 bmp Vt Set:  [600 mL-660 mL] 600 mL PEEP:  [5 cmH20] 5 cmH20 Pressure Support:  [10 cmH20] 10 cmH20 Plateau Pressure:  [1 cmH20-27 cmH20] 2 cmH20 INTAKE / OUTPUT:  Intake/Output Summary (Last 24 hours) at 05/19/14 0947 Last data filed at 05/19/14 0900  Gross per 24 hour  Intake 1406.39 ml  Output      0 ml  Net 1406.39 ml    PHYSICAL EXAMINATION:  Gen: well appearing on vent HEENT: NCAT EOMi, ETT PULM CTA B CV: RRR, no audible murmur AB: BS+, soft Ext: R knee dressing in place Neuro: A&Ox4, maew  LABS:  CBC  Recent Labs Lab 05/18/14 1526 05/18/14 1545 05/18/14 2202 05/19/14 0315  WBC 9.0  --  8.8 9.6  HGB 9.9* 12.6* 10.4* 9.5*  HCT 32.7* 37.0* 33.9*  30.9*  PLT 131*  --  141* 168   Coag's No results for input(s): APTT, INR in the last 168 hours. BMET  Recent Labs Lab 05/18/14 1545 05/18/14 2202 05/19/14 0527  NA 137  --  136  K 4.9  --  7.1*  CL 100  --  98  CO2  --   --  21  BUN 83*  --  90*  CREATININE 15.10* 16.43* 16.95*  GLUCOSE 107*  --  123*   Electrolytes  Recent Labs Lab 05/19/14 0527  CALCIUM 8.1*   Sepsis Markers No results for input(s): LATICACIDVEN, PROCALCITON, O2SATVEN in the last 168 hours. ABG No results for input(s): PHART, PCO2ART, PO2ART in the last 168 hours. Liver Enzymes No results for input(s): AST, ALT, ALKPHOS, BILITOT, ALBUMIN in the last 168 hours. Cardiac Enzymes No results for input(s): TROPONINI, PROBNP in the last 168 hours. Glucose  Recent Labs Lab 05/18/14 1514  GLUCAP 99    Imaging Dg Chest Port 1 View  05/18/2014   CLINICAL DATA:  Shortness of breath, post op intubation.  EXAM: PORTABLE CHEST - 1 VIEW  COMPARISON:  01/12/2014  FINDINGS: Endotracheal tube is 5 cm above the carina. There is cardiomegaly with vascular congestion. Very low lung volumes. No effusions. Bibasilar atelectasis. No acute bony abnormality.  IMPRESSION: Endotracheal tube 5 cm above the carina.  Low lung volumes.  Cardiomegaly, bibasilar atelectasis.   Electronically Signed   By: Charlett Nose M.D.   On: 05/18/2014 21:17     ASSESSMENT / PLAN:  PULMONARY ETT 2/28 >>  A: Vent dependent respiratory failure post operatively, primarily due to sedation issues> now ready for extubation Chronic hypoxemic respiratory failure> he says he uses 2L O2 at home P:   Extubate Incentive spirometry, flutter  CARDIOVASCULAR A:  Hypertension H/O CHF LVEF 45% followed at [facility] Afib, followed at [facility] Concern for infective endocarditis given high risk pt p/w septic native joint P:  Treat discomfort Monitor hemodynamics F/U TTE to eval for vegitations -> IE Add metoprolol low dose Warfarin per  pharm  RENAL A:   ESRD on home HD P:   Per renal  GASTROINTESTINAL A:   No issues P:   SUP: IV PPI ordered, hold after extubation Advance diet post extubation  HEMATOLOGIC A:   Chronic anemia of ESRD P:  DVT px: SQ heparin > restart warfarin Monitor CBC intermittently Transfuse per usual ICU guidelines  INFECTIOUS A:   Septic arthritis Possible IE, will obtain blood cx/TTE P:   Blood x2 2/28 >>  Blood x2 2/29 >> L knee effusion 2/28 >> neg Gram's stain >>   Vanc 2/28 >>  Pip-tazo 2/28 >>   ENDOCRINE A:   No issues P:   Monitor glu on chem panels Consider SSI for glu > 180  NEUROLOGIC A:   Prolonged post op anesthetic effect > resolved Post op pain Chronic use of benzodiazepines and opioids P:   D/C sedation protocol Post op oxycodone prn pain   My cc time 35 minutes  Heber [place], MD [place] PCCM Pager: [phone] Cell: [phone] If no response, call [phone]   05/19/2014, 9:47 AM

## 2014-05-19 NOTE — Consult Note (Signed)
[facility] Renal Consultation Note  Indication for Consultation:  Management of ESRD/hemodialysis; anemia, hypertension/volume and secondary hyperparathyroidism  HPI: Gary Rivera is a 54 y.o. male with a history of atrial flutter with RVR s/p cardioversion 07/11/13 and ESRD on home dialysis with NxStage five days a week who presented to the ED yesterday with right knee pain for one month and aspiration showing increased white blood cells and was admitted for arthroscopic lavage per Dr. Percell Miller.  He remains on a ventilator with sedation, but is arousable and without complaints and will likely be extubated today.  He is on Zosyn for septic right knee and is afebrile.  However, his potassium was 7.1, and his chest x-ray showed increased edema, so he will receive hemodialysis today.  He has had increasing difficulty managing his home treatments and required dialysis at [facility] on Friday 2/26.  His dialysis is followed by Dr. Joelyn Oms, and he has shown some interest in changing to noctural incenter hemodialysis.  Dialysis Orders:  NxStage with PureFlow MTWTF, BFR 400, EDW 140 kg, Heparin 5000 U, AVF @ LUA   Past Medical History  Diagnosis Date  . ESRD on hemodialysis     Started dialysis with PD in 2013 and switched to HD in spring 2014 because of fungal peritonitis.  Takes dialysis at home 5 days a week approx 3h 15 min each session.     . CHF (congestive heart failure)   . Peritoneal dialysis catheter in place   . Anemia     patient reporats that last hgb 7.9 a week ago  . Anxiety state, unspecified   . Hypertension    Past Surgical History  Procedure Laterality Date  . Appendectomy    . Peritoneal catheter insertion    . Tee without cardioversion N/A 07/11/2013    Procedure: TRANSESOPHAGEAL ECHOCARDIOGRAM (TEE);  Surgeon: Fay Records, MD;  Location: [place];  Service: Cardiovascular;  Laterality: N/A;  . Cardioversion N/A 07/11/2013    Procedure: CARDIOVERSION;   Surgeon: Fay Records, MD;  Location: [facility] ENDOSCOPY;  Service: Cardiovascular;  Laterality: N/A;   Family History  Problem Relation Age of Onset  . Diabetes Mother   . Hypertension Mother   . Hypertension Sister   . Asthma Sister    Social History He denies any history of tobacco, alcohol, or illicit drug use.  Allergies  Allergen Reactions  . Renvela [Sevelamer] Nausea And Vomiting   Prior to Admission medications   Medication Sig Start Date End Date Taking? Authorizing Provider  calcitRIOL (ROCALTROL) 0.5 MCG capsule Take 0.5 mcg by mouth daily.   Yes Historical Provider, MD  clonazePAM (KLONOPIN) 0.5 MG tablet Take 1 mg by mouth 2 (two) times daily as needed for anxiety.    Yes Historical Provider, MD  lanthanum (FOSRENOL) 1000 MG chewable tablet Chew 1,000-2,000 mg by mouth 3 (three) times daily with meals. Takes 2 tablets with meals and 1 tablet with snacks.   Yes Historical Provider, MD  metoCLOPramide (REGLAN) 10 MG tablet Take 10 mg by mouth daily. 05/03/14  Yes Historical Provider, MD  omeprazole (PRILOSEC) 20 MG capsule Take 20 mg by mouth daily.  02/24/14  Yes Historical Provider, MD  Oxycodone HCl 10 MG TABS Take 10 mg by mouth 3 (three) times daily as needed.  05/01/14  Yes Historical Provider, MD  ursodiol (ACTIGALL) 300 MG capsule Take 1 capsule (300 mg total) by mouth 3 (three) times daily. 07/22/13  Yes Stark Klein, MD  albuterol (PROVENTIL  HFA;VENTOLIN HFA) 108 (90 BASE) MCG/ACT inhaler Inhale 1 puff into the lungs every 6 (six) hours as needed for wheezing or shortness of breath.    Historical Provider, MD  oxyCODONE-acetaminophen (PERCOCET/ROXICET) 5-325 MG per tablet Take 1-2 tablets by mouth every 4 (four) hours as needed (severe pain). Patient not taking: Reported on 05/18/2014 04/20/14   Dot Lanes, MD  warfarin (COUMADIN) 5 MG tablet Take 1 tablet (5 mg total) by mouth daily. Patient not taking: Reported on 05/18/2014 01/12/14   Blanchie Dessert, MD   Labs:   Results for orders placed or performed during the hospital encounter of 05/18/14 (from the past 48 hour(s))  Body fluid culture     Status: None (Preliminary result)   Collection Time: 05/18/14  2:51 PM  Result Value Ref Range   Specimen Description FLUID    Special Requests Normal    Gram Stain      CYTOSPIN SLIDE WBC PRESENT, PREDOMINANTLY PMN NO ORGANISMS SEEN Performed at [facility] Performed at [place] Performed at [facility]     Report Status PENDING   Gram stain     Status: None   Collection Time: 05/18/14  2:51 PM  Result Value Ref Range   Specimen Description FLUID    Special Requests Normal    Gram Stain      CYTOSPIN SLIDE WBC PRESENT, PREDOMINANTLY PMN NO ORGANISMS SEEN    Report Status 05/18/2014 FINAL   Cell count + diff,  w/ cryst-synvl fld     Status: Abnormal   Collection Time: 05/18/14  2:51 PM  Result Value Ref Range   Color, Synovial STRAW (A) YELLOW   Appearance-Synovial TURBID (A) CLEAR   Crystals, Fluid NO CRYSTALS SEEN    WBC, Synovial 151530 (H) 0 - 200 /cu mm   Neutrophil, Synovial 99 (H) 0 - 25 %   Lymphocytes-Synovial Fld 0 0 - 20 %   Monocyte-Macrophage-Synovial Fluid 1 (L) 50 - 90 %   Eosinophils-Synovial 0 0 - 1 %  CBG monitoring, ED     Status: None   Collection Time: 05/18/14  3:14 PM  Result Value Ref Range   Glucose-Capillary 99 70 - 99 mg/dL   Comment 1 Notify RN    Comment 2 Documented in Char   CBC with Differential/Platelet     Status: Abnormal   Collection Time: 05/18/14  3:26 PM  Result Value Ref Range   WBC 9.0 4.0 - 10.5 K/uL   RBC 3.49 (L) 4.22 - 5.81 MIL/uL   Hemoglobin 9.9 (L) 13.0 - 17.0 g/dL   HCT 32.7 (L) 39.0 - 52.0 %   MCV 93.7 78.0 - 100.0 fL   MCH 28.4 26.0 - 34.0 pg   MCHC 30.3 30.0 - 36.0 g/dL   RDW 14.6 11.5 - 15.5 %   Platelets 131 (L) 150 - 400 K/uL   Neutrophils Relative % 84 (H) 43 - 77 %   Neutro Abs 7.6 1.7 - 7.7 K/uL   Lymphocytes Relative 6  (L) 12 - 46 %   Lymphs Abs 0.5 (L) 0.7 - 4.0 K/uL   Monocytes Relative 9 3 - 12 %   Monocytes Absolute 0.8 0.1 - 1.0 K/uL   Eosinophils Relative 1 0 - 5 %   Eosinophils Absolute 0.1 0.0 - 0.7 K/uL   Basophils Relative 0 0 - 1 %   Basophils Absolute 0.0 0.0 - 0.1 K/uL  Sedimentation rate  Status: Abnormal   Collection Time: 05/18/14  3:26 PM  Result Value Ref Range   Sed Rate 66 (H) 0 - 16 mm/hr  I-stat chem 8, ed     Status: Abnormal   Collection Time: 05/18/14  3:45 PM  Result Value Ref Range   Sodium 137 135 - 145 mmol/L   Potassium 4.9 3.5 - 5.1 mmol/L   Chloride 100 96 - 112 mmol/L   BUN 83 (H) 6 - 23 mg/dL   Creatinine, Ser 15.10 (H) 0.50 - 1.35 mg/dL   Glucose, Bld 107 (H) 70 - 99 mg/dL   Calcium, Ion 1.04 (L) 1.12 - 1.23 mmol/L   TCO2 24 0 - 100 mmol/L   Hemoglobin 12.6 (L) 13.0 - 17.0 g/dL   HCT 37.0 (L) 39.0 - 52.0 %  Anaerobic culture     Status: None (Preliminary result)   Collection Time: 05/18/14  8:25 PM  Result Value Ref Range   Specimen Description FLUID SYNOVIAL RIGHT KNEE    Special Requests PATIENT ON FOLLOWING ROCEPHIN,VANCOMYCIN    Gram Stain      ABUNDANT WBC PRESENT, PREDOMINANTLY PMN NO ORGANISMS SEEN Performed at [facility]    Culture PENDING    Report Status PENDING   Body fluid culture     Status: None (Preliminary result)   Collection Time: 05/18/14  8:25 PM  Result Value Ref Range   Specimen Description SYNOVIAL RIGHT KNEE    Special Requests ROCEPHIN,VANCOMYCIN    Gram Stain      ABUNDANT WBC PRESENT, PREDOMINANTLY PMN NO ORGANISMS SEEN Performed at [facility]    Culture PENDING    Report Status PENDING   Triglycerides     Status: None   Collection Time: 05/18/14 10:02 PM  Result Value Ref Range   Triglycerides 90 <150 mg/dL  CBC     Status: Abnormal   Collection Time: 05/18/14 10:02 PM  Result Value Ref Range   WBC 8.8 4.0 - 10.5 K/uL   RBC 3.58 (L) 4.22 - 5.81 MIL/uL   Hemoglobin 10.4 (L) 13.0 - 17.0 g/dL     Comment: REPEATED TO VERIFY DELTA CHECK NOTED    HCT 33.9 (L) 39.0 - 52.0 %   MCV 94.7 78.0 - 100.0 fL   MCH 29.1 26.0 - 34.0 pg   MCHC 30.7 30.0 - 36.0 g/dL   RDW 14.8 11.5 - 15.5 %   Platelets 141 (L) 150 - 400 K/uL  Creatinine, serum     Status: Abnormal   Collection Time: 05/18/14 10:02 PM  Result Value Ref Range   Creatinine, Ser 16.43 (H) 0.50 - 1.35 mg/dL   GFR calc non Af Amer 3 (L) >90 mL/min   GFR calc Af Amer 3 (L) >90 mL/min    Comment: (NOTE) The eGFR has been calculated using the CKD EPI equation. This calculation has not been validated in all clinical situations. eGFR's persistently <90 mL/min signify possible Chronic Kidney Disease.   MRSA PCR Screening     Status: None   Collection Time: 05/18/14 10:38 PM  Result Value Ref Range   MRSA by PCR NEGATIVE NEGATIVE    Comment:        The GeneXpert MRSA Assay (FDA approved for NASAL specimens only), is one component of a comprehensive MRSA colonization surveillance program. It is not intended to diagnose MRSA infection nor to guide or monitor treatment for MRSA infections.   CBC     Status: Abnormal   Collection Time: 05/19/14  3:15 AM  Result Value Ref Range   WBC 9.6 4.0 - 10.5 K/uL    Comment: WHITE COUNT CONFIRMED ON SMEAR REPEATED TO VERIFY SPECIMEN CHECKED FOR CLOTS    RBC 3.22 (L) 4.22 - 5.81 MIL/uL   Hemoglobin 9.5 (L) 13.0 - 17.0 g/dL   HCT 30.9 (L) 39.0 - 52.0 %   MCV 96.0 78.0 - 100.0 fL   MCH 29.5 26.0 - 34.0 pg   MCHC 30.7 30.0 - 36.0 g/dL   RDW 14.9 11.5 - 15.5 %   Platelets 168 150 - 400 K/uL  Basic metabolic panel     Status: Abnormal   Collection Time: 05/19/14  5:27 AM  Result Value Ref Range   Sodium 136 135 - 145 mmol/L   Potassium 7.1 (HH) 3.5 - 5.1 mmol/L    Comment: CRITICAL RESULT CALLED TO, READ BACK BY AND VERIFIED WITH: MCATEE,E RN 05/19/2014 0701 JORDANS REPEATED TO VERIFY    Chloride 98 96 - 112 mmol/L   CO2 21 19 - 32 mmol/L   Glucose, Bld 123 (H) 70 - 99  mg/dL   BUN 90 (H) 6 - 23 mg/dL   Creatinine, Ser 16.95 (H) 0.50 - 1.35 mg/dL   Calcium 8.1 (L) 8.4 - 10.5 mg/dL   GFR calc non Af Amer 3 (L) >90 mL/min   GFR calc Af Amer 3 (L) >90 mL/min    Comment: (NOTE) The eGFR has been calculated using the CKD EPI equation. This calculation has not been validated in all clinical situations. eGFR's persistently <90 mL/min signify possible Chronic Kidney Disease.    Anion gap 17 (H) 5 - 15   Review of systems not obtained due to patient factors.  Physical Exam: Filed Vitals:   05/19/14 0900  BP: 105/48  Pulse: 73  Temp:   Resp: 22     General appearance: on ventilator, but alert, in no apparent distress morbid obesity Head: Normocephalic, without obvious abnormality, atraumatic Neck: no adenopathy, no carotid bruit, no JVD and supple, symmetrical, trachea midline Resp: clear to auscultation bilaterally bibasilar rales Cardio: regular rate and rhythm, S1, S2 normal, no murmur, click, rub or gallop Gr 2/6 M.   GI: obese, soft, non-tender; bowel sounds normal; no masses,  no organomegaly liver down 6 cm Extremities: extremities normal, atraumatic, no cyanosis 2+ edema Neurologic: Grossly normal Dialysis Access:  AVF @ LUA cannulated with BFR 400  Assessment/Plan: 1. Septic R knee - s/p arthroscopic washout per Dr. Percell Miller yesterday, on Zosyn, extubation likely today. 2. Hyperkalemia - K 7.1 today, now on HD. 3. ESRD - Home HD on NxStage, may require transition to incenter HD. Poor aderence, vol xs, ^ solute, inadequate tx.  Not clearing solute adequately 4. Hypertension/volume - BP 158/65, no meds; CXR with pulmonary edema, wt 152 kg with UF goal 5 L today.  5. Anemia - Hgb 9.5, received Venofer 200 mg 2/26.  Aranesp 40 mcg today. 6. Metabolic bone disease - Calcitriol 0.5 mcg, Fosrenol 2 g with meals. 7. Nutrition - currently intubated. 8. Nonadherence  LYLES,CHARLES 05/19/2014, 9:10 AM I have seen and examined this patient and agree  with the plan of care seen , eval, examined.  Take care of acute prob, and needs long term change.   .  Obbie Lewallen L 05/19/2014, 10:30 AM   Attending Nephrologist:  Mauricia Area, MD

## 2014-05-19 NOTE — Progress Notes (Signed)
PT Cancellation Note  Patient Details Name: SCOTTIE SERAPHIN MRN: [id number] DOB: 1961/03/04   Cancelled Treatment:    Reason Eval/Treat Not Completed: Medical issues which prohibited therapy (pt remained intubated post-op, hopeful for extubation today will hold til next date)   Delorse Lek 05/19/2014, 8:10 AM Delaney Meigs, PT [phone]

## 2014-05-19 NOTE — Progress Notes (Signed)
Patient is still intubated post-op, PCCM assumed care; d/w Dr. Kendrick Fries -TRH will sign off at this time  Trishia Cuthrell, Isaiah Serge

## 2014-05-19 NOTE — Progress Notes (Signed)
ANTIBIOTIC CONSULT NOTE - FOLLOW UP  Pharmacy Consult for Vancomycin and Cefepime Indication: r/o septic arthritis  Allergies  Allergen Reactions  . Renvela [Sevelamer] Nausea And Vomiting    Patient Measurements: Height:  (188 cm) Weight: (!) 335 lb 1.6 oz (152 kg) IBW/kg (Calculated) : 82.2  Wt: 147.4 kg Ht: 6'2"  Vital Signs: Temp: 98 F (36.7 C) (02/29 1204) Temp Source: Oral (02/29 1204) BP: 144/70 mmHg (02/29 1247) Pulse Rate: 81 (02/29 1247) Intake/Output from previous day: 02/28 0701 - 02/29 0700 In: 1375.4 [I.V.:1245.4; NG/GT:30; IV Piggyback:100] Out: -  Intake/Output from this shift: Total I/O In: 231 [I.V.:31; IV Piggyback:200] Out: -   Labs:  Recent Labs  05/18/14 1526 05/18/14 1545 05/18/14 2202 05/19/14 0315 05/19/14 0527  WBC 9.0  --  8.8 9.6  --   HGB 9.9* 12.6* 10.4* 9.5*  --   PLT 131*  --  141* 168  --   CREATININE  --  15.10* 16.43*  --  16.95*   Estimated Creatinine Clearance: 7.8 mL/min (by C-G formula based on Cr of 16.95). No results for input(s): VANCOTROUGH, VANCOPEAK, VANCORANDOM, GENTTROUGH, GENTPEAK, GENTRANDOM, TOBRATROUGH, TOBRAPEAK, TOBRARND, AMIKACINPEAK, AMIKACINTROU, AMIKACIN in the last 72 hours.   Microbiology: Recent Results (from the past 720 hour(s))  Body fluid culture     Status: None (Preliminary result)   Collection Time: 05/18/14  2:51 PM  Result Value Ref Range Status   Specimen Description FLUID  Final   Special Requests Normal  Final   Gram Stain   Final    CYTOSPIN SLIDE WBC PRESENT, PREDOMINANTLY PMN NO ORGANISMS SEEN Performed at [facility] Performed at [facility]    Culture NO GROWTH Performed at [facility]   Final   Report Status PENDING  Incomplete  Gram stain     Status: None   Collection Time: 05/18/14  2:51 PM  Result Value Ref Range Status   Specimen Description FLUID  Final   Special Requests Normal  Final   Gram Stain   Final    CYTOSPIN SLIDE WBC  PRESENT, PREDOMINANTLY PMN NO ORGANISMS SEEN    Report Status 05/18/2014 FINAL  Final  Anaerobic culture     Status: None (Preliminary result)   Collection Time: 05/18/14  8:25 PM  Result Value Ref Range Status   Specimen Description FLUID SYNOVIAL RIGHT KNEE  Final   Special Requests PATIENT ON FOLLOWING ROCEPHIN,VANCOMYCIN  Final   Gram Stain   Final    ABUNDANT WBC PRESENT, PREDOMINANTLY PMN NO ORGANISMS SEEN Performed at [facility]    Culture   Final    NO ANAEROBES ISOLATED; CULTURE IN PROGRESS FOR 5 DAYS Performed at [facility]    Report Status PENDING  Incomplete  Body fluid culture     Status: None (Preliminary result)   Collection Time: 05/18/14  8:25 PM  Result Value Ref Range Status   Specimen Description SYNOVIAL RIGHT KNEE  Final   Special Requests ROCEPHIN,VANCOMYCIN  Final   Gram Stain   Final    ABUNDANT WBC PRESENT, PREDOMINANTLY PMN NO ORGANISMS SEEN Performed at [facility]    Culture PENDING  Incomplete   Report Status PENDING  Incomplete  MRSA PCR Screening     Status: None   Collection Time: 05/18/14 10:38 PM  Result Value Ref Range Status   MRSA by PCR NEGATIVE NEGATIVE Final    Comment:        The GeneXpert MRSA Assay (FDA  approved for NASAL specimens only), is one component of a comprehensive MRSA colonization surveillance program. It is not intended to diagnose MRSA infection nor to guide or monitor treatment for MRSA infections.     Medical History: Past Medical History  Diagnosis Date  . ESRD on hemodialysis     Started dialysis with PD in 2013 and switched to HD in spring 2014 because of fungal peritonitis.  Takes dialysis at home 5 days a week approx 3h 15 min each session.     . CHF (congestive heart failure)   . Peritoneal dialysis catheter in place   . Anemia     patient reporats that last hgb 7.9 a week ago  . Anxiety state, unspecified   . Hypertension     Assessment: 52 YOM with ESRD on home  HD 5 days a week who presented to the [facility] on 2/28 with complaints of worsening R-knee swelling and pain over the past month. Fluid drawn from knee appears infected and was sent for culturing. Pharmacy consulted to start Vancomycin along with Rocephin per MD for empiric septic arthritis coverage  Per discussion with the patient, they days he dialyzes varies each week. His last home HD session was on Friday. Renal has yet to see and it is unclear what the patient's HD schedule will be if he is admitted. Will load the patient and wait for HD to be scheduled before entering any additional doses.   Pt received loading dose of vancomycin and was on zosyn. Pharmacy is consulted to transition to Cefepime with Vancomycin. Pt is afebrile, WBC wnl and underwent HD today.  Goal of Therapy:  Pre-HD Vancomycin level of 15-25 mcg/ml  Plan:  -Vancomycin 2500 mg IV x 1 dose to load followed by vancomycin 1g qHD -Cefepime 2g IV qHD -Will continue to follow HD schedule/duration, culture results, LOT, and antibiotic de-escalation plans   Arlean Hopping. Newman Pies, PharmD Clinical Pharmacist Pager [phone] 05/19/2014 1:00 PM

## 2014-05-20 LAB — BASIC METABOLIC PANEL
Anion gap: 21 — ABNORMAL HIGH (ref 5–15)
BUN: 53 mg/dL — ABNORMAL HIGH (ref 6–23)
CHLORIDE: 94 mmol/L — AB (ref 96–112)
CO2: 24 mmol/L (ref 19–32)
Calcium: 8.1 mg/dL — ABNORMAL LOW (ref 8.4–10.5)
Creatinine, Ser: 12.93 mg/dL — ABNORMAL HIGH (ref 0.50–1.35)
GFR calc non Af Amer: 4 mL/min — ABNORMAL LOW (ref >90)
GFR, EST AFRICAN AMERICAN: 4 mL/min — AB (ref >90)
GLUCOSE: 110 mg/dL — AB (ref 70–99)
Potassium: 4.3 mmol/L (ref 3.5–5.1)
SODIUM: 139 mmol/L (ref 135–145)

## 2014-05-20 LAB — CBC
HCT: 33.8 % — ABNORMAL LOW (ref 39.0–52.0)
Hemoglobin: 10 g/dL — ABNORMAL LOW (ref 13.0–17.0)
MCH: 28.7 pg (ref 26.0–34.0)
MCHC: 29.6 g/dL — ABNORMAL LOW (ref 30.0–36.0)
MCV: 96.8 fL (ref 78.0–100.0)
Platelets: 178 K/uL (ref 150–400)
RBC: 3.49 MIL/uL — ABNORMAL LOW (ref 4.22–5.81)
RDW: 15.1 % (ref 11.5–15.5)
WBC: 6.4 K/uL (ref 4.0–10.5)

## 2014-05-20 LAB — PROTIME-INR
INR: 1.15 (ref 0.00–1.49)
Prothrombin Time: 14.8 seconds (ref 11.6–15.2)

## 2014-05-20 MED ORDER — OXYCODONE HCL 5 MG PO TABS: 10.0000 mg | ORAL_TABLET | Freq: Three times a day (TID) | ORAL | Status: DC | PRN

## 2014-05-20 MED ORDER — OXYCODONE HCL 5 MG PO TABS
10.0000 mg | ORAL_TABLET | Freq: Four times a day (QID) | ORAL | Status: DC | PRN
  Administered 2014-05-20: 15 mg via ORAL
  Filled 2014-05-20: qty 3

## 2014-05-20 MED ORDER — LANTHANUM CARBONATE 500 MG PO CHEW: 1000.0000 mg | CHEWABLE_TABLET | ORAL | Status: DC | PRN

## 2014-05-20 MED ORDER — RENA-VITE PO TABS
1.0000 | ORAL_TABLET | Freq: Every day | ORAL | Status: DC
  Administered 2014-05-20: 1 via ORAL
  Filled 2014-05-20: qty 1

## 2014-05-20 MED ORDER — LANTHANUM CARBONATE 500 MG PO CHEW
2000.0000 mg | CHEWABLE_TABLET | Freq: Three times a day (TID) | ORAL | Status: DC
  Administered 2014-05-20 – 2014-05-21 (×3): 2000 mg via ORAL
  Filled 2014-05-20: qty 4

## 2014-05-20 NOTE — Progress Notes (Signed)
     Subjective:  Patient reports pain as mild.  Patient is resting comfortably in a chair.  He was able to walk around the unit twice today without much pain.  Objective:   VITALS:   Filed Vitals:   05/19/14 1500 05/19/14 2100 05/20/14 0700 05/20/14 1021  BP: 177/74 92/56 104/61 99/57  Pulse: 88 88 89 87  Temp:  98.7 F (37.1 C) 98.3 F (36.8 C) 98.4 F (36.9 C)  TempSrc:  Oral Oral Oral  Resp: 15 20 18 19   Height:      Weight:   144.697 kg (319 lb)   SpO2: 94% 97% 95% 96%    Neurologically intact ABD soft Neurovascular intact Sensation intact distally Intact pulses distally Incision: dressing C/D/I   Lab Results  Component Value Date   WBC 6.4 05/20/2014   HGB 10.0* 05/20/2014   HCT 33.8* 05/20/2014   MCV 96.8 05/20/2014   PLT 178 05/20/2014   BMET    Component Value Date/Time   NA 139 05/20/2014 0640   K 4.3 05/20/2014 0640   CL 94* 05/20/2014 0640   CO2 24 05/20/2014 0640   GLUCOSE 110* 05/20/2014 0640   BUN 53* 05/20/2014 0640   CREATININE 12.93* 05/20/2014 0640   CALCIUM 8.1* 05/20/2014 0640   CALCIUM 8.0* 12/04/2009 0455   GFRNONAA 4* 05/20/2014 0640   GFRAA 4* 05/20/2014 0640     Assessment/Plan: 2 Days Post-Op   Principal Problem:   Septic arthritis Active Problems:   Chronic systolic congestive heart failure, NYHA class 1   HTN (hypertension)   Atrial flutter with RVR   ESRD on hemodialysis   Normocytic anemia   Up with therapy WBAT in the RLE Continue abx (vanc/cefepime) per ID while culture is pending    Tomica Arseneault Marie 05/20/2014, 10:46 AM [phone]

## 2014-05-20 NOTE — Evaluation (Signed)
Physical Therapy Evaluation Patient Details Name: Gary Rivera MRN: [id number] DOB: [birth year] Today's Date: 05/20/2014   History of Present Illness  Pt is a 54 y/o male admitted s/p I&D of the right knee on 05/18/14.  Clinical Impression  Pt admitted with above diagnosis. Pt currently with functional limitations due to the deficits listed below (see PT Problem List). At the time of PT eval pt was able to perform transfers and ambulation with no physical assistance. Pt was instructed in HEP and issued a handout. Pt will benefit from skilled PT to increase their independence and safety with mobility to allow discharge to the venue listed below.  At this time pt states he will have transportation to outpatient PT, however needs to confirm with sister. Will continue to follow.      Follow Up Recommendations Outpatient PT    Equipment Recommendations  Other (comment) (Tub bench)    Recommendations for Other Services       Precautions / Restrictions Precautions Precautions: Fall Restrictions Weight Bearing Restrictions: Yes RLE Weight Bearing: Weight bearing as tolerated      Mobility  Bed Mobility Overal bed mobility: Modified Independent             General bed mobility comments: No physical assist required. HOB slightly elevated.   Transfers Overall transfer level: Needs assistance Equipment used: Rolling walker (2 wheeled) Transfers: Sit to/from Stand Sit to Stand: Supervision         General transfer comment: No physical assist required. Pt did not demonstrate any unsteadiness or LOB. Supervision for safety.   Ambulation/Gait Ambulation/Gait assistance: Supervision Ambulation Distance (Feet): 480 Feet Assistive device: Rolling walker (2 wheeled) Gait Pattern/deviations: Step-through pattern;Decreased stride length;Decreased step length - right Gait velocity: Decreased Gait velocity interpretation: Below normal speed for age/gender General Gait Details: Pt  was cued for improved posture, sequencing and safety awareness with the RW. VC's also for increased heel strike and even step/stride length bilaterally.   Stairs            Wheelchair Mobility    Modified Rankin (Stroke Patients Only)       Balance Overall balance assessment: Needs assistance Sitting-balance support: Feet supported;No upper extremity supported Sitting balance-Leahy Scale: Good     Standing balance support: No upper extremity supported;During functional activity Standing balance-Leahy Scale: Fair                               Pertinent Vitals/Pain Pain Assessment: 0-10 Pain Score: 5  Pain Location: R knee Pain Descriptors / Indicators: Operative site guarding Pain Intervention(s): Limited activity within patient's tolerance;Monitored during session;Patient requesting pain meds-RN notified;RN gave pain meds during session    Home Living Family/patient expects to be discharged to:: Private residence Living Arrangements: Alone Available Help at Discharge: Personal care attendant;Family;Available PRN/intermittently Type of Home: House Home Access: Stairs to enter (Previous entry states ramped entrance)   Entrance Stairs-Number of Steps: 2 Home Layout: One level Home Equipment: Walker - 2 wheels      Prior Function Level of Independence: Needs assistance   Gait / Transfers Assistance Needed: RW all the time for the past month  ADL's / Homemaking Assistance Needed: Independent with ADL's. Aide/family for household chores/cooking.         Hand Dominance        Extremity/Trunk Assessment   Upper Extremity Assessment: LUE deficits/detail       LUE Deficits / Details: Pt  reports that he has either arthritis or bursitis in his L shoulder. Palpable knot around lateral deltoid insertion. Pt reports that he uses heat at home when it "flares up" and therapist provided instant heat pack for the area.   Lower Extremity Assessment: RLE  deficits/detail RLE Deficits / Details: Acute pain and decreased strength/AROM consistent with infection s/p I&D    Cervical / Trunk Assessment: Normal  Communication   Communication: No difficulties  Cognition Arousal/Alertness: Awake/alert Behavior During Therapy: WFL for tasks assessed/performed Overall Cognitive Status: Within Functional Limits for tasks assessed                      General Comments      Exercises General Exercises - Lower Extremity Ankle Circles/Pumps: 15 reps Quad Sets: 15 reps Long Arc Quad: 15 reps Hip ABduction/ADduction: 15 reps      Assessment/Plan    PT Assessment Patient needs continued PT services  PT Diagnosis Difficulty walking;Acute pain   PT Problem List Decreased strength;Decreased range of motion;Decreased activity tolerance;Decreased balance;Decreased mobility;Decreased knowledge of use of DME;Decreased safety awareness;Decreased knowledge of precautions;Pain  PT Treatment Interventions DME instruction;Gait training;Stair training;Functional mobility training;Therapeutic activities;Therapeutic exercise;Neuromuscular re-education;Patient/family education   PT Goals (Current goals can be found in the Care Plan section) Acute Rehab PT Goals Patient Stated Goal: Home tomorrow PT Goal Formulation: With patient Time For Goal Achievement: 05/27/14 Potential to Achieve Goals: Good    Frequency Min 3X/week   Barriers to discharge        Co-evaluation               End of Session Equipment Utilized During Treatment: Gait belt;Oxygen Activity Tolerance: Patient tolerated treatment well   Nurse Communication: Mobility status         Time: 0930-1020 PT Time Calculation (min) (ACUTE ONLY): 50 min   Charges:   PT Evaluation $Initial PT Evaluation Tier I: 1 Procedure PT Treatments $Gait Training: 8-22 mins $Therapeutic Exercise: 8-22 mins   PT G Codes:        Conni Slipper Jun 14, 2014, 10:52 AM   Conni Slipper,  PT, DPT Acute Rehabilitation Services Pager: [phone]

## 2014-05-20 NOTE — Progress Notes (Signed)
Subjective: Interval History: has complaints, apologizes for his behavior yest.  Agrees that he has not been dialyzing well.  Would like incenter Noct.  Objective: Vital signs in last 24 hours: Temp:  [98 F (36.7 C)-98.7 F (37.1 C)] 98.3 F (36.8 C) (03/01 0700) Pulse Rate:  [73-91] 89 (03/01 0700) Resp:  [15-25] 18 (03/01 0700) BP: (92-181)/(48-80) 104/61 mmHg (03/01 0700) SpO2:  [91 %-98 %] 95 % (03/01 0700) FiO2 (%):  [40 %] 40 % (02/29 0900) Weight:  [144.697 kg (319 lb)] 144.697 kg (319 lb) (03/01 0700) Weight change:   Intake/Output from previous day: 02/29 0701 - 03/01 0700 In: 281 [I.V.:31; IV Piggyback:250] Out: 4700  Intake/Output this shift:    General appearance: cooperative and morbidly obese Resp: diminished breath sounds bilaterally Cardio: S1, S2 normal and systolic murmur: holosystolic 2/6, blowing at apex GI: obese, pos bs, liver down 5 cm.  soft Extremities: AVF L FA,  R knee dressed.  Lab Results:  Recent Labs  05/19/14 0315 05/20/14 0640  WBC 9.6 6.4  HGB 9.5* 10.0*  HCT 30.9* 33.8*  PLT 168 178   BMET:  Recent Labs  05/19/14 0527 05/20/14 0640  NA 136 139  K 7.1* 4.3  CL 98 94*  CO2 21 24  GLUCOSE 123* 110*  BUN 90* 53*  CREATININE 16.95* 12.93*  CALCIUM 8.1* 8.1*   No results for input(s): PTH in the last 72 hours. Iron Studies: No results for input(s): IRON, TIBC, TRANSFERRIN, FERRITIN in the last 72 hours.  Studies/Results: Portable Chest Xray  05/19/2014   CLINICAL DATA:  Respiratory failure, end-stage dialysis dependent renal failure, CHF  EXAM: PORTABLE CHEST - 1 VIEW  COMPARISON:  Portable chest x-ray of May 18, 2014.  FINDINGS: The lungs remain mildly hypoinflated. The cardiopericardial silhouette remains enlarged. The retrocardiac region remains dense and there is complete opacification of the left hemidiaphragm today. The interstitial markings at the right lung base are more prominent. The endotracheal tube tip lies  3.8 cm above the carina. The esophagogastric tube tip projects below the inferior margin of the image.  IMPRESSION: There been slight interval worsening in CHF with increased left lower lobe atelectasis and small left pleural effusion.   Electronically Signed   By: David  [place]   On: 05/19/2014 07:49   Dg Chest Port 1 View  05/18/2014   CLINICAL DATA:  Shortness of breath, post op intubation.  EXAM: PORTABLE CHEST - 1 VIEW  COMPARISON:  01/12/2014  FINDINGS: Endotracheal tube is 5 cm above the carina. There is cardiomegaly with vascular congestion. Very low lung volumes. No effusions. Bibasilar atelectasis. No acute bony abnormality.  IMPRESSION: Endotracheal tube 5 cm above the carina.  Low lung volumes.  Cardiomegaly, bibasilar atelectasis.   Electronically Signed   By: Charlett Nose M.D.   On: 05/18/2014 21:17    I have reviewed the patient's current medications.  Assessment/Plan: 1 ESRD  Discussed options and why.  Will see if can get on Noct.  Much more calm today. 2 anemia stable 3 HPTH 4 Obesity 5 Pyarthosis cultures neg thus far. 6 ^ k resolved P HD in am , see if can arrange NOCT,  Vit D, AB , f/u C&S , knee per Ortho and ID     LOS: 2 days   Nettie Cromwell L 05/20/2014,8:33 AM

## 2014-05-20 NOTE — Evaluation (Addendum)
Occupational Therapy Evaluation Patient Details Name: SERGIO BEAVER MRN: [id number] DOB: 02-22-1961 Today's Date: 05/20/2014    History of Present Illness Pt is a 54 y/o male admitted s/p I&D of the right knee on 05/18/14.   Clinical Impression   Pt s/p above. Pt independent with ADLs, PTA. Feel pt will benefit from acute OT to increase independence and activity tolerance prior to d/c. Plan to practice tub transfer next session.    Follow Up Recommendations  No OT follow up;Supervision - Intermittent    Equipment Recommendations  Other (comment);Tub/shower bench (bariatric bedside commode)    Recommendations for Other Services       Precautions / Restrictions Precautions Precautions: Fall Restrictions Weight Bearing Restrictions: Yes RLE Weight Bearing: Weight bearing as tolerated      Mobility Bed Mobility Overal bed mobility: Modified Independent              Transfers Overall transfer level: Needs assistance Equipment used: Rolling walker (2 wheeled) Transfers: Sit to/from Stand Sit to Stand: Min guard;Min assist         General transfer comment: cues for hand placement.     Balance Supervision for ambulation with RW.                           ADL Overall ADL's : Needs assistance/impaired                 Upper Body Dressing : Supervision/safety;Sitting (supervision to gather items)   Lower Body Dressing: Sit to/from stand;Set up;Supervision/safety   Toilet Transfer: Supervision/safety;Ambulation;RW (bed; Min guard for sit to stand transfer)           Functional mobility during ADLs: Supervision/safety;Rolling walker General ADL Comments: Educated on LB dressing technique. Educated on safety such as sitting for most of LB ADLs, safe shoewear, use of bag on walker. Discussed tub transfer techniques and options for shower chair. Discussed multiple uses of 3 in 1. Educated on deep breathing technique.     Vision      Perception     Praxis      Pertinent Vitals/Pain Pain Assessment: 0-10 Pain Score: 3  Pain Location: Right knee Pain Intervention(s): Monitored during session  **Pt returning from bathroom on RA with nurse when OT arrived. Placed pt on around 2L of O2. Pt's O2 in 60's-70's when checked with pulse oximeter. Cues for deep breathing technique-O2 trended up to 90's.     Hand Dominance     Extremity/Trunk Assessment Upper Extremity Assessment Upper Extremity Assessment: Overall WFL for tasks assessed LUE Deficits / Details: Pt reports that he has either arthritis or bursitis in his L shoulder. Palpable knot around lateral deltoid insertion. Pt reports that he uses heat at home when it "flares up."   Lower Extremity Assessment Lower Extremity Assessment: Defer to PT evaluation RLE Deficits / Details: Acute pain and decreased strength/AROM consistent with infection s/p I&D   Cervical / Trunk Assessment Cervical / Trunk Assessment: Normal   Communication Communication Communication: No difficulties   Cognition Arousal/Alertness: Awake/alert Behavior During Therapy: WFL for tasks assessed/performed Overall Cognitive Status: Within Functional Limits for tasks assessed                     General Comments          Shoulder Instructions      Home Living Family/patient expects to be discharged to:: Private residence Living Arrangements: Alone Available Help at Discharge:  Personal care attendant;Family;Available PRN/intermittently Type of Home: House Home Access: Stairs to enter (previous entry states ramped entry) Entrance Stairs-Number of Steps: 2   Home Layout: One level     Bathroom Shower/Tub: Chief Strategy Officer: Standard     Home Equipment: Environmental consultant - 2 wheels          Prior Functioning/Environment Level of Independence: Needs assistance  Gait / Transfers Assistance Needed: RW all the time for the past month ADL's / Homemaking Assistance  Needed: Independent with ADL's. Aide/family for household chores/cooking.         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 OT Goal Formulation: With patient Time For Goal Achievement: 05/27/14 Potential to Achieve Goals: Good ADL Goals Pt Will Perform Lower Body Dressing: with modified independence;sit to/from stand Pt Will Transfer to Toilet: with modified independence;ambulating (3 in 1 over commode) Pt Will Perform Tub/Shower Transfer: Tub transfer;with modified independence;ambulating;tub bench;rolling walker  OT Frequency: Min 2X/week   Barriers to D/C:            Co-evaluation              End of Session Equipment Utilized During Treatment: Gait belt;Rolling walker; Oxygen  Activity Tolerance: Patient tolerated treatment well Patient left: in bed;with call bell/phone within reach   Time: 7829-5621 OT Time Calculation (min): 15 min Charges:  OT General Charges $OT Visit: 1 Procedure OT Evaluation $Initial OT Evaluation Tier I: 1 Procedure G-CodesEarlie Raveling OTR/L [phone] 05/20/2014, 12:24 PM

## 2014-05-20 NOTE — Progress Notes (Signed)
[facility]    Date of Admission:  05/18/2014   Total days of antibiotics 3        Day 3 vanco        Day 2 cefepime           ID: Gary Rivera is a 54 y.o. male with right septic knee POD#2 arthroscopic wash out  Principal Problem:   Septic arthritis Active Problems:   Chronic systolic congestive heart failure, NYHA class 1   HTN (hypertension)   Atrial flutter with RVR   ESRD on hemodialysis   Normocytic anemia    Subjective: Afebrile, he feels improvement in right knee  Medications:  . antiseptic oral rinse  7 mL Mouth Rinse BID  . calcitRIOL  0.5 mcg Oral Daily  . [START ON 05/21/2014] ceFEPime (MAXIPIME) IV  2 g Intravenous Q M,W,F-1800  . coumadin book   Does not apply Once  . darbepoetin (ARANESP) injection - DIALYSIS  40 mcg Intravenous Q Mon-HD  . heparin  5,000 Units Subcutaneous 3 times per day  . lanthanum  2,000 mg Oral TID WC  . metoprolol tartrate  12.5 mg Oral BID  . multivitamin  1 tablet Oral QHS  . pantoprazole  40 mg Oral Daily  . sodium chloride  3 mL Intravenous Q12H  . ursodiol  300 mg Oral TID  . vancomycin  1,000 mg Intravenous Q M,W,F-HD  . warfarin  7.5 mg Oral ONCE-1800  . warfarin   Does not apply Once  . Warfarin - Pharmacist Dosing Inpatient   Does not apply q1800    Objective: Vital signs in last 24 hours: Temp:  [98 F (36.7 C)-98.7 F (37.1 C)] 98.4 F (36.9 C) (03/01 1021) Pulse Rate:  [78-89] 87 (03/01 1021) Resp:  [15-25] 19 (03/01 1021) BP: (92-177)/(49-80) 99/57 mmHg (03/01 1021) SpO2:  [91 %-98 %] 96 % (03/01 1021) Weight:  [319 lb (144.697 kg)] 319 lb (144.697 kg) (03/01 0700)  Physical Exam  Constitutional: He is oriented to person, place, and time. He appears well-developed and well-nourished. No distress.  HENT:  Mouth/Throat: Oropharynx is clear and moist. No oropharyngeal exudate.  Cardiovascular: Normal rate, regular rhythm and normal heart sounds. Exam reveals no gallop and no  friction rub.  No murmur heard.  Pulmonary/Chest: Effort normal and breath sounds normal. No respiratory distress. He has no wheezes.  Ext: right knee bandaged Skin: Skin is warm and dry. No rash noted. No erythema.     Lab Results  Recent Labs  05/19/14 0315 05/19/14 0527 05/20/14 0640  WBC 9.6  --  6.4  HGB 9.5*  --  10.0*  HCT 30.9*  --  33.8*  NA  --  136 139  K  --  7.1* 4.3  CL  --  98 94*  CO2  --  21 24  BUN  --  90* 53*  CREATININE  --  16.95* 12.93*   Liver Panel No results for input(s): PROT, ALBUMIN, AST, ALT, ALKPHOS, BILITOT, BILIDIR, IBILI in the last 72 hours. Sedimentation Rate  Recent Labs  05/18/14 1526  ESRSEDRATE 66*    Microbiology: 2/28 synovial fluid NGTD 2/28 or culture NGTd 2/29 blood cx pending Studies/Results: Portable Chest Xray  05/19/2014   CLINICAL DATA:  Respiratory failure, end-stage dialysis dependent renal failure, CHF  EXAM: PORTABLE CHEST - 1 VIEW  COMPARISON:  Portable chest x-ray of May 18, 2014.  FINDINGS: The lungs remain mildly hypoinflated. The cardiopericardial  silhouette remains enlarged. The retrocardiac region remains dense and there is complete opacification of the left hemidiaphragm today. The interstitial markings at the right lung base are more prominent. The endotracheal tube tip lies 3.8 cm above the carina. The esophagogastric tube tip projects below the inferior margin of the image.  IMPRESSION: There been slight interval worsening in CHF with increased left lower lobe atelectasis and small left pleural effusion.   Electronically Signed   By: David  [place]   On: 05/19/2014 07:49   Dg Chest Port 1 View  05/18/2014   CLINICAL DATA:  Shortness of breath, post op intubation.  EXAM: PORTABLE CHEST - 1 VIEW  COMPARISON:  01/12/2014  FINDINGS: Endotracheal tube is 5 cm above the carina. There is cardiomegaly with vascular congestion. Very low lung volumes. No effusions. Bibasilar atelectasis. No acute bony abnormality.   IMPRESSION: Endotracheal tube 5 cm above the carina.  Low lung volumes.  Cardiomegaly, bibasilar atelectasis.   Electronically Signed   By: Charlett Nose M.D.   On: 05/18/2014 21:17     Assessment/Plan: Right septic arthritis = high chance that Or cultures will be negative since patient had been on antibiotics prior to surgery. Recommend to treat with 4 wk of IV antibiotics with vancomycin. Vancomycin dose should be adjusted per pharmacy protocol for goal trough of 15-20, dose given after HD. We will see him back in the ID clinic in 4 wk for evaluation if he needs further oral antibiotics. Recommend to keep overnight to see if any growth at 72hr of cx.   Drue Second Brand [facility] for Infectious Diseases Cell: [phone] Pager: [phone]  05/20/2014, 10:44 AM

## 2014-05-20 NOTE — Progress Notes (Signed)
PROGRESS NOTE  Gary Rivera PJS:[id number] DOB: 1960/10/06 DOA: 05/18/2014 PCP: [name], MD  43 M with ESRD and CHF, s/p septic knee washout 2/28 evening. Could not be extubated due to prolonged somnolence post op. Transferred from PACU to ICU intubated. PCCM to asked ot assume care while in ICU. Initially admitted by [facility] service  Assessment/Plan: septic arthritis- right knee: s/p wash out -ID following for abx: vanc/cefepime (await cultures although have been on abx so may not show anything)  ESRD (home HD); consulted nephrology for management   H/o PAF (Per patient: he was on coumadin for short time, then he stopped after discussions with his cardiologist Dr Revonda Humphrey due to problems with his dialysis);  patient did not want to continue anticoagulation   H/o CHF, echo (06/2013): LVEF 25-30%, LVH, dilated LA, RA, RV, moe-severe pulmonary HTN -on repeat echo LV improved to 45% at [facility]; patient denies cardiopulmonary symptoms  Probable undiagnosed OSA: needs outpatient polysomnography; BiPAP as needed  Code Status: full Family Communication: patient Disposition Plan: home 1-2 days   Consultants:  ID  Ortho  renal  Procedures:      HPI/Subjective: Doing well this AM Knee feels better  Objective: Filed Vitals:   05/20/14 0700  BP: 104/61  Pulse: 89  Temp: 98.3 F (36.8 C)  Resp: 18    Intake/Output Summary (Last 24 hours) at 05/20/14 0846 Last data filed at 05/19/14 1520  Gross per 24 hour  Intake  263.3 ml  Output   4700 ml  Net -4436.7 ml   Filed Weights   05/19/14 0830 05/20/14 0700  Weight: 152 kg (335 lb 1.6 oz) 144.697 kg (319 lb)    Exam:   General:  Pleasant/cooperative  Cardiovascular: rrr  Respiratory: diminished throughout  Abdomen: obese  Musculoskeletal: knee wrapped   Data Reviewed: Basic Metabolic Panel:  Recent Labs Lab 05/18/14 1545 05/18/14 2202 05/19/14 0527 05/20/14 0640  NA 137  --  136 139  K  4.9  --  7.1* 4.3  CL 100  --  98 94*  CO2  --   --  21 24  GLUCOSE 107*  --  123* 110*  BUN 83*  --  90* 53*  CREATININE 15.10* 16.43* 16.95* 12.93*  CALCIUM  --   --  8.1* 8.1*   Liver Function Tests: No results for input(s): AST, ALT, ALKPHOS, BILITOT, PROT, ALBUMIN in the last 168 hours. No results for input(s): LIPASE, AMYLASE in the last 168 hours. No results for input(s): AMMONIA in the last 168 hours. CBC:  Recent Labs Lab 05/18/14 1526 05/18/14 1545 05/18/14 2202 05/19/14 0315 05/20/14 0640  WBC 9.0  --  8.8 9.6 6.4  NEUTROABS 7.6  --   --   --   --   HGB 9.9* 12.6* 10.4* 9.5* 10.0*  HCT 32.7* 37.0* 33.9* 30.9* 33.8*  MCV 93.7  --  94.7 96.0 96.8  PLT 131*  --  141* 168 178   Cardiac Enzymes: No results for input(s): CKTOTAL, CKMB, CKMBINDEX, TROPONINI in the last 168 hours. BNP (last 3 results) No results for input(s): BNP in the last 8760 hours.  ProBNP (last 3 results) No results for input(s): PROBNP in the last 8760 hours.  CBG:  Recent Labs Lab 05/18/14 1514  GLUCAP 99    Recent Results (from the past 240 hour(s))  Body fluid culture     Status: None (Preliminary result)   Collection Time: 05/18/14  2:51 PM  Result Value Ref Range Status  Specimen Description FLUID  Final   Special Requests Normal  Final   Gram Stain   Final    CYTOSPIN SLIDE WBC PRESENT, PREDOMINANTLY PMN NO ORGANISMS SEEN Performed at [facility] Performed at [facility]    Culture NO GROWTH Performed at [facility]   Final   Report Status PENDING  Incomplete  Gram stain     Status: None   Collection Time: 05/18/14  2:51 PM  Result Value Ref Range Status   Specimen Description FLUID  Final   Special Requests Normal  Final   Gram Stain   Final    CYTOSPIN SLIDE WBC PRESENT, PREDOMINANTLY PMN NO ORGANISMS SEEN    Report Status 05/18/2014 FINAL  Final  Anaerobic culture     Status: None (Preliminary result)   Collection Time: 05/18/14   8:25 PM  Result Value Ref Range Status   Specimen Description FLUID SYNOVIAL RIGHT KNEE  Final   Special Requests PATIENT ON FOLLOWING ROCEPHIN,VANCOMYCIN  Final   Gram Stain   Final    ABUNDANT WBC PRESENT, PREDOMINANTLY PMN NO ORGANISMS SEEN Performed at [facility]    Culture   Final    NO ANAEROBES ISOLATED; CULTURE IN PROGRESS FOR 5 DAYS Performed at [facility]    Report Status PENDING  Incomplete  Body fluid culture     Status: None (Preliminary result)   Collection Time: 05/18/14  8:25 PM  Result Value Ref Range Status   Specimen Description SYNOVIAL RIGHT KNEE  Final   Special Requests ROCEPHIN,VANCOMYCIN  Final   Gram Stain   Final    ABUNDANT WBC PRESENT, PREDOMINANTLY PMN NO ORGANISMS SEEN Performed at [facility]    Culture PENDING  Incomplete   Report Status PENDING  Incomplete  MRSA PCR Screening     Status: None   Collection Time: 05/18/14 10:38 PM  Result Value Ref Range Status   MRSA by PCR NEGATIVE NEGATIVE Final    Comment:        The GeneXpert MRSA Assay (FDA approved for NASAL specimens only), is one component of a comprehensive MRSA colonization surveillance program. It is not intended to diagnose MRSA infection nor to guide or monitor treatment for MRSA infections.   Culture, blood (routine x 2)     Status: None (Preliminary result)   Collection Time: 05/19/14  3:15 AM  Result Value Ref Range Status   Specimen Description BLOOD RIGHT ARM  Final   Special Requests BOTTLES DRAWN AEROBIC AND ANAEROBIC 5CC  Final   Culture   Final           BLOOD CULTURE RECEIVED NO GROWTH TO DATE CULTURE WILL BE HELD FOR 5 DAYS BEFORE ISSUING A FINAL NEGATIVE REPORT Note: Culture results may be compromised due to an inadequate volume of blood received in culture bottles. Performed at [facility]    Report Status PENDING  Incomplete  Culture, blood (routine x 2)     Status: None (Preliminary result)   Collection Time:  05/19/14  5:27 AM  Result Value Ref Range Status   Specimen Description BLOOD RIGHT HAND  Final   Special Requests BOTTLES DRAWN AEROBIC ONLY 3CC  Final   Culture   Final           BLOOD CULTURE RECEIVED NO GROWTH TO DATE CULTURE WILL BE HELD FOR 5 DAYS BEFORE ISSUING A FINAL NEGATIVE REPORT Performed at [facility]    Report Status PENDING  Incomplete  Studies: Portable Chest Xray  05/19/2014   CLINICAL DATA:  Respiratory failure, end-stage dialysis dependent renal failure, CHF  EXAM: PORTABLE CHEST - 1 VIEW  COMPARISON:  Portable chest x-ray of May 18, 2014.  FINDINGS: The lungs remain mildly hypoinflated. The cardiopericardial silhouette remains enlarged. The retrocardiac region remains dense and there is complete opacification of the left hemidiaphragm today. The interstitial markings at the right lung base are more prominent. The endotracheal tube tip lies 3.8 cm above the carina. The esophagogastric tube tip projects below the inferior margin of the image.  IMPRESSION: There been slight interval worsening in CHF with increased left lower lobe atelectasis and small left pleural effusion.   Electronically Signed   By: David  [place]   On: 05/19/2014 07:49   Dg Chest Port 1 View  05/18/2014   CLINICAL DATA:  Shortness of breath, post op intubation.  EXAM: PORTABLE CHEST - 1 VIEW  COMPARISON:  01/12/2014  FINDINGS: Endotracheal tube is 5 cm above the carina. There is cardiomegaly with vascular congestion. Very low lung volumes. No effusions. Bibasilar atelectasis. No acute bony abnormality.  IMPRESSION: Endotracheal tube 5 cm above the carina.  Low lung volumes.  Cardiomegaly, bibasilar atelectasis.   Electronically Signed   By: Charlett Nose M.D.   On: 05/18/2014 21:17    Scheduled Meds: . antiseptic oral rinse  7 mL Mouth Rinse BID  . calcitRIOL  0.5 mcg Oral Daily  . [START ON 05/21/2014] ceFEPime (MAXIPIME) IV  2 g Intravenous Q M,W,F-1800  . coumadin book   Does not apply  Once  . darbepoetin (ARANESP) injection - DIALYSIS  40 mcg Intravenous Q Mon-HD  . heparin  5,000 Units Subcutaneous 3 times per day  . lanthanum  1,000-2,000 mg Oral TID WC  . metoprolol tartrate  12.5 mg Oral BID  . multivitamin  1 tablet Oral QHS  . pantoprazole  40 mg Oral Daily  . sodium chloride  3 mL Intravenous Q12H  . ursodiol  300 mg Oral TID  . vancomycin  1,000 mg Intravenous Q M,W,F-HD  . warfarin  7.5 mg Oral ONCE-1800  . warfarin   Does not apply Once  . Warfarin - Pharmacist Dosing Inpatient   Does not apply q1800   Continuous Infusions:  Antibiotics Given (last 72 hours)    Date/Time Action Medication Dose Rate   05/18/14 1845 Given  [Started prior to holding room.  Infusing upon arrival]   vancomycin (VANCOCIN) 2,500 mg in sodium chloride 0.9 % 500 mL IVPB 2,500 mg    05/19/14 0006 Given  [sent late from pharmacy]   piperacillin-tazobactam (ZOSYN) IVPB 2.25 g 2.25 g 100 mL/hr   05/19/14 0516 Given   piperacillin-tazobactam (ZOSYN) IVPB 2.25 g 2.25 g 100 mL/hr   05/19/14 1208 Given   vancomycin (VANCOCIN) IVPB 1000 mg/200 mL premix 1,000 mg 200 mL/hr   05/19/14 1520 Given   ceFEPIme (MAXIPIME) 2 g in dextrose 5 % 50 mL IVPB 2 g 100 mL/hr      Principal Problem:   Septic arthritis Active Problems:   Chronic systolic congestive heart failure, NYHA class 1   HTN (hypertension)   Atrial flutter with RVR   ESRD on hemodialysis   Normocytic anemia    Time spent: 25 min    Ciarah Peace  [facility] Pager [phone]. If 7PM-7AM, please contact night-coverage at [email or web address], password [facility] 05/20/2014, 8:46 AM  LOS: 2 days

## 2014-05-21 LAB — RENAL FUNCTION PANEL
ANION GAP: 19 — AB (ref 5–15)
Albumin: 3 g/dL — ABNORMAL LOW (ref 3.5–5.2)
BUN: 69 mg/dL — ABNORMAL HIGH (ref 6–23)
CHLORIDE: 93 mmol/L — AB (ref 96–112)
CO2: 25 mmol/L (ref 19–32)
Calcium: 8.2 mg/dL — ABNORMAL LOW (ref 8.4–10.5)
Creatinine, Ser: 16.41 mg/dL — ABNORMAL HIGH (ref 0.50–1.35)
GFR calc Af Amer: 3 mL/min — ABNORMAL LOW (ref >90)
GFR calc non Af Amer: 3 mL/min — ABNORMAL LOW (ref >90)
GLUCOSE: 112 mg/dL — AB (ref 70–99)
POTASSIUM: 4.7 mmol/L (ref 3.5–5.1)
Phosphorus: 10.4 mg/dL — ABNORMAL HIGH (ref 2.3–4.6)
Sodium: 137 mmol/L (ref 135–145)

## 2014-05-21 LAB — CBC
HCT: 34.4 % — ABNORMAL LOW (ref 39.0–52.0)
Hemoglobin: 10.3 g/dL — ABNORMAL LOW (ref 13.0–17.0)
MCH: 29 pg (ref 26.0–34.0)
MCHC: 29.9 g/dL — AB (ref 30.0–36.0)
MCV: 96.9 fL (ref 78.0–100.0)
PLATELETS: 221 10*3/uL (ref 150–400)
RBC: 3.55 MIL/uL — ABNORMAL LOW (ref 4.22–5.81)
RDW: 15.4 % (ref 11.5–15.5)
WBC: 6 10*3/uL (ref 4.0–10.5)

## 2014-05-21 LAB — HEPATITIS B SURFACE ANTIGEN: Hepatitis B Surface Ag: NEGATIVE

## 2014-05-21 LAB — PROTIME-INR
INR: 1.11 (ref 0.00–1.49)
Prothrombin Time: 14.5 seconds (ref 11.6–15.2)

## 2014-05-21 MED ORDER — SODIUM CHLORIDE 0.9 % IV SOLN: 100.0000 mL | INTRAVENOUS | Status: DC | PRN

## 2014-05-21 MED ORDER — VANCOMYCIN HCL IN DEXTROSE 1-5 GM/200ML-% IV SOLN: 1000.0000 mg | INTRAVENOUS | Status: DC

## 2014-05-21 MED ORDER — DIPHENHYDRAMINE HCL 50 MG/ML IJ SOLN: 25.0000 mg | Freq: Once | INTRAMUSCULAR | Status: DC

## 2014-05-21 MED ORDER — NEPRO/CARBSTEADY PO LIQD
237.0000 mL | ORAL | Status: DC | PRN
  Filled 2014-05-21: qty 237

## 2014-05-21 MED ORDER — PENTAFLUOROPROP-TETRAFLUOROETH EX AERO: 1.0000 "application " | INHALATION_SPRAY | CUTANEOUS | Status: DC | PRN

## 2014-05-21 MED ORDER — RENA-VITE PO TABS: 1.0000 | ORAL_TABLET | Freq: Every day | ORAL | Status: AC

## 2014-05-21 MED ORDER — HEPARIN SODIUM (PORCINE) 1000 UNIT/ML DIALYSIS
40.0000 [IU]/kg | Freq: Once | INTRAMUSCULAR | Status: DC
  Filled 2014-05-21: qty 6

## 2014-05-21 MED ORDER — LIDOCAINE HCL (PF) 1 % IJ SOLN: 5.0000 mL | INTRAMUSCULAR | Status: DC | PRN

## 2014-05-21 MED ORDER — ALTEPLASE 2 MG IJ SOLR
2.0000 mg | Freq: Once | INTRAMUSCULAR | Status: DC | PRN
  Filled 2014-05-21: qty 2

## 2014-05-21 MED ORDER — OXYCODONE HCL 10 MG PO TABS: 10.0000 mg | ORAL_TABLET | Freq: Three times a day (TID) | ORAL | Status: DC | PRN

## 2014-05-21 MED ORDER — METOCLOPRAMIDE HCL 5 MG/ML IJ SOLN: 10.0000 mg | Freq: Once | INTRAMUSCULAR | Status: DC

## 2014-05-21 MED ORDER — HEPARIN SODIUM (PORCINE) 1000 UNIT/ML DIALYSIS: 1000.0000 [IU] | INTRAMUSCULAR | Status: DC | PRN

## 2014-05-21 MED ORDER — LIDOCAINE-PRILOCAINE 2.5-2.5 % EX CREA
1.0000 | TOPICAL_CREAM | CUTANEOUS | Status: DC | PRN
Start: 2014-05-21 — End: 2014-05-21
  Filled 2014-05-21: qty 5

## 2014-05-21 MED ORDER — OXYCODONE HCL 5 MG PO TABS: 5.0000 mg | ORAL_TABLET | Freq: Four times a day (QID) | ORAL | Status: DC | PRN

## 2014-05-21 NOTE — Progress Notes (Signed)
Subjective:   Pain improved in R knee. Ambulated 3 times yesterday. On HD now  Objective Filed Vitals:   05/21/14 0517 05/21/14 0720 05/21/14 0736 05/21/14 0800  BP: 107/60 96/62 101/57 109/70  Pulse: 100 103 94 96  Temp: 100 F (37.8 C) 99 F (37.2 C)    TempSrc: Oral Oral    Resp: 18 26    Height:      Weight:  146.1 kg (322 lb 1.5 oz)    SpO2: 90% 98%     Physical Exam General: alert and oriented. No acute distress. Mod obesity Heart: RRR 2/6 systolic murmur Lungs: dim bases. CTA/ unlabored Abdomen: obese. Soft nontender +BS liver down 5 cm Extremities: R knee ace wrapped. Dialysis Access:  Rivera AVF patent on HD  Assessment/Plan: 1. Septic arthritis R knee- s/p wash out. On vanc and cefepime. Cultures negative. ID recommends 4 weeks IV vanc at HD 2. ESRD - WMF. - wants nocturnal- on waiting list, will need CLIP 3. Anemia - hgb 10.3 cont Aranesp 40 q monday 4. Secondary hyperparathyroidism - Ca+ 8.2 phos 10.4- concitriol and fosrenol 5. HTN/volume - 109/70 no BP meds/ no volume excess 6. Nutrition - renal diet.   Gary Duhamel, NP [facility] Beeper [phone] 05/21/2014,8:29 AM  LOS: 3 days  I have seen and examined this patient and agree with the plan of care seen, eval, counseled, examined, spoke with coordinators of HD re noct and 3rd shift.  Not open at this time.   .  Gary Rivera 05/21/2014, 1:41 PM   Additional Objective Labs: Basic Metabolic Panel:  Recent Labs Lab 05/18/14 1545 05/18/14 2202 05/19/14 0527 05/20/14 0640  NA 137  --  136 139  K 4.9  --  7.1* 4.3  CL 100  --  98 94*  CO2  --   --  21 24  GLUCOSE 107*  --  123* 110*  BUN 83*  --  90* 53*  CREATININE 15.10* 16.43* 16.95* 12.93*  CALCIUM  --   --  8.1* 8.1*   Liver Function Tests: No results for input(s): AST, ALT, ALKPHOS, BILITOT, PROT, ALBUMIN in the last 168 hours. No results for input(s): LIPASE, AMYLASE in the last 168 hours. CBC:  Recent Labs Lab  05/18/14 1526  05/18/14 2202 05/19/14 0315 05/20/14 0640 05/21/14 0620  WBC 9.0  --  8.8 9.6 6.4 6.0  NEUTROABS 7.6  --   --   --   --   --   HGB 9.9*  < > 10.4* 9.5* 10.0* 10.3*  HCT 32.7*  < > 33.9* 30.9* 33.8* 34.4*  MCV 93.7  --  94.7 96.0 96.8 96.9  PLT 131*  --  141* 168 178 221  < > = values in this interval not displayed. Blood Culture    Component Value Date/Time   SDES BLOOD RIGHT HAND 05/19/2014 0527   SPECREQUEST BOTTLES DRAWN AEROBIC ONLY 3CC 05/19/2014 0527   CULT  05/19/2014 0527           BLOOD CULTURE RECEIVED NO GROWTH TO DATE CULTURE WILL BE HELD FOR 5 DAYS BEFORE ISSUING A FINAL NEGATIVE REPORT Performed at [facility]    REPTSTATUS PENDING 05/19/2014 6553    Cardiac Enzymes: No results for input(s): CKTOTAL, CKMB, CKMBINDEX, TROPONINI in the last 168 hours. CBG:  Recent Labs Lab 05/18/14 1514  GLUCAP 99   Iron Studies: No results for input(s): IRON, TIBC, TRANSFERRIN, FERRITIN in the last 72 hours. @lablastinr3 @ Studies/Results: No  results found. Medications:   . antiseptic oral rinse  7 mL Mouth Rinse BID  . calcitRIOL  0.5 mcg Oral Daily  . ceFEPime (MAXIPIME) IV  2 g Intravenous Q M,W,F-1800  . darbepoetin (ARANESP) injection - DIALYSIS  40 mcg Intravenous Q Mon-HD  . heparin  5,000 Units Subcutaneous 3 times per day  . heparin  40 Units/kg Dialysis Once in dialysis  . lanthanum  2,000 mg Oral TID WC  . metoprolol tartrate  12.5 mg Oral BID  . multivitamin  1 tablet Oral QHS  . pantoprazole  40 mg Oral Daily  . sodium chloride  3 mL Intravenous Q12H  . ursodiol  300 mg Oral TID  . vancomycin  1,000 mg Intravenous Q M,W,F-HD

## 2014-05-21 NOTE — Procedures (Signed)
I was present at this session.  I have reviewed the session itself and made appropriate changes.  Hd via LLA aVF  Talik Casique L 3/2/20168:29 AM

## 2014-05-21 NOTE — Discharge Summary (Signed)
Physician Discharge Summary  Gary Rivera ZOX:[id number] DOB: 03-Apr-1960 DOA: 05/18/2014  PCP: [name], MD  Admit date: 05/18/2014 Discharge date: 05/22/2014  Time spent: 35 minutes  Recommendations for Outpatient Follow-up:  1. vanc with dialysis x 4 weeks\ 2. ID follow up 3. Outpatient sleep study 4. Home health PT  Discharge Diagnoses:  Principal Problem:   Septic arthritis Active Problems:   Chronic systolic congestive heart failure, NYHA class 1   HTN (hypertension)   Atrial flutter with RVR   ESRD on hemodialysis   Normocytic anemia   Discharge Condition: improved  Diet recommendation: renal  Filed Weights   05/20/14 2039 05/21/14 0720 05/21/14 1207  Weight: 144.788 kg (319 lb 3.2 oz) 146.1 kg (322 lb 1.5 oz) 143.2 kg (315 lb 11.2 oz)    History of present illness:  Gary Rivera is a 54 y.o. male with PMH of ESRD on HD (home dialysis), HTN, h/o PAF, Anemia, CHF presented with L sided knee pain, swelling associated with subjective fever, chills, inability to ambulate for more than 3 weeks. He denies any trauma, no prior knee swellings; arthrocentesis performed in ED showed synovial fluid Gram stain reveals no organisms, no crystals, multiple PMNs, WBC over 150,000 white blood cells suspicious for septic arthritis; ED d/w orthopedics who is planning to take patient to surgery tonight; hospitalist is asked for evalaution, admission   Hospital Course:  septic arthritis- right knee: s/p wash out -ID following for abx: d/c on 4 weeks of vanc once outpatient HD can be arranged  ESRD (home HD); consulted nephrology for management   H/o PAF (Per patient: he was on coumadin for short time, then he stopped after discussions with his cardiologist Dr Revonda Humphrey due to problems with his dialysis); patient did not want to continue anticoagulation   H/o CHF, echo (06/2013): LVEF 25-30%, LVH, dilated LA, RA, RVsevere pulmonary HTN -on repeat echo LV improved to 45%  at [facility]; patient denies cardiopulmonary symptoms  Probable undiagnosed OSA: needs outpatient polysomnography  -chronic resp failure -wears O2 at home  Procedures: ARTHROSCOPIC WASHOUT OF RIGHT KNEE   Consultations:    Discharge Exam: Filed Vitals:   05/21/14 1207  BP: 106/52  Pulse: 92  Temp: 98.3 F (36.8 C)  Resp: 20    General: A+Ox3, NAD   Discharge Instructions   Discharge Instructions    Discharge instructions    Complete by:  As directed   HD on T/TH/SAT at [facility] [address]- 11:15 Will get 4 weeks of IV abx- vanc  Follow up with ID clinic in 4 weeks Home health Renal diet Outpatient sleep study     Increase activity slowly    Complete by:  As directed           Discharge Medication List as of 05/21/2014  4:28 PM    START taking these medications   Details  multivitamin (RENA-VIT) TABS tablet Take 1 tablet by mouth at bedtime., Starting 05/21/2014, Until Discontinued, Print    vancomycin (VANCOCIN) 1 GM/200ML SOLN Inject 200 mLs (1,000 mg total) into the vein every Monday, Wednesday, and Friday with hemodialysis., Starting 05/21/2014, Until Discontinued, No Print      CONTINUE these medications which have CHANGED   Details  Oxycodone HCl 10 MG TABS Take 1 tablet (10 mg total) by mouth 3 (three) times daily as needed., Starting 05/21/2014, Until Discontinued, Print      CONTINUE these medications which have NOT CHANGED   Details  calcitRIOL (ROCALTROL) 0.5 MCG capsule Take  0.5 mcg by mouth daily., Until Discontinued, Historical Med    clonazePAM (KLONOPIN) 0.5 MG tablet Take 1 mg by mouth 2 (two) times daily as needed for anxiety. , Until Discontinued, Historical Med    lanthanum (FOSRENOL) 1000 MG chewable tablet Chew 1,000-2,000 mg by mouth 3 (three) times daily with meals. Takes 2 tablets with meals and 1 tablet with snacks., Until Discontinued, Historical Med    metoCLOPramide (REGLAN) 10 MG tablet Take 10 mg by mouth daily., Starting 05/03/2014, Until  Discontinued, Historical Med    omeprazole (PRILOSEC) 20 MG capsule Take 20 mg by mouth daily. , Starting 02/24/2014, Until Discontinued, Historical Med    ursodiol (ACTIGALL) 300 MG capsule Take 1 capsule (300 mg total) by mouth 3 (three) times daily., Starting 07/22/2013, Until Discontinued, Normal    albuterol (PROVENTIL HFA;VENTOLIN HFA) 108 (90 BASE) MCG/ACT inhaler Inhale 1 puff into the lungs every 6 (six) hours as needed for wheezing or shortness of breath., Until Discontinued, Historical Med      STOP taking these medications     amoxicillin-clavulanate (AUGMENTIN) 875-125 MG per tablet      oxyCODONE-acetaminophen (PERCOCET/ROXICET) 5-325 MG per tablet      warfarin (COUMADIN) 5 MG tablet        Allergies  Allergen Reactions  . Renvela [Sevelamer] Nausea And Vomiting   Follow-up Information    Follow up with [facility], MD In 1 week.   Specialty:  Internal Medicine   Contact information:   [address] [place] [address] [phone]        The results of significant diagnostics from this hospitalization (including imaging, microbiology, ancillary and laboratory) are listed below for reference.    Significant Diagnostic Studies: Portable Chest Xray  05/19/2014   CLINICAL DATA:  Respiratory failure, end-stage dialysis dependent renal failure, CHF  EXAM: PORTABLE CHEST - 1 VIEW  COMPARISON:  Portable chest x-ray of May 18, 2014.  FINDINGS: The lungs remain mildly hypoinflated. The cardiopericardial silhouette remains enlarged. The retrocardiac region remains dense and there is complete opacification of the left hemidiaphragm today. The interstitial markings at the right lung base are more prominent. The endotracheal tube tip lies 3.8 cm above the carina. The esophagogastric tube tip projects below the inferior margin of the image.  IMPRESSION: There been slight interval worsening in CHF with increased left lower lobe atelectasis and small left pleural  effusion.   Electronically Signed   By: David  [place]   On: 05/19/2014 07:49   Dg Chest Port 1 View  05/18/2014   CLINICAL DATA:  Shortness of breath, post op intubation.  EXAM: PORTABLE CHEST - 1 VIEW  COMPARISON:  01/12/2014  FINDINGS: Endotracheal tube is 5 cm above the carina. There is cardiomegaly with vascular congestion. Very low lung volumes. No effusions. Bibasilar atelectasis. No acute bony abnormality.  IMPRESSION: Endotracheal tube 5 cm above the carina.  Low lung volumes.  Cardiomegaly, bibasilar atelectasis.   Electronically Signed   By: Charlett Nose M.D.   On: 05/18/2014 21:17    Microbiology: Recent Results (from the past 240 hour(s))  Body fluid culture     Status: None   Collection Time: 05/18/14  2:51 PM  Result Value Ref Range Status   Specimen Description FLUID  Final   Special Requests Normal  Final   Gram Stain   Final    CYTOSPIN SLIDE WBC PRESENT, PREDOMINANTLY PMN NO ORGANISMS SEEN Performed at [facility] Performed at [facility]  Culture   Final    NO GROWTH 3 DAYS Performed at [facility]    Report Status 05/22/2014 FINAL  Final  Gram stain     Status: None   Collection Time: 05/18/14  2:51 PM  Result Value Ref Range Status   Specimen Description FLUID  Final   Special Requests Normal  Final   Gram Stain   Final    CYTOSPIN SLIDE WBC PRESENT, PREDOMINANTLY PMN NO ORGANISMS SEEN    Report Status 05/18/2014 FINAL  Final  Anaerobic culture     Status: None (Preliminary result)   Collection Time: 05/18/14  8:25 PM  Result Value Ref Range Status   Specimen Description FLUID SYNOVIAL RIGHT KNEE  Final   Special Requests PATIENT ON FOLLOWING ROCEPHIN,VANCOMYCIN  Final   Gram Stain   Final    ABUNDANT WBC PRESENT, PREDOMINANTLY PMN NO ORGANISMS SEEN Performed at [facility]    Culture   Final    NO ANAEROBES ISOLATED; CULTURE IN PROGRESS FOR 5 DAYS Performed at [facility]    Report Status PENDING   Incomplete  Body fluid culture     Status: None   Collection Time: 05/18/14  8:25 PM  Result Value Ref Range Status   Specimen Description SYNOVIAL RIGHT KNEE  Final   Special Requests ROCEPHIN,VANCOMYCIN  Final   Gram Stain   Final    ABUNDANT WBC PRESENT, PREDOMINANTLY PMN NO ORGANISMS SEEN Performed at [facility]    Culture   Final    NO GROWTH 3 DAYS Performed at [facility]    Report Status 05/22/2014 FINAL  Final  MRSA PCR Screening     Status: None   Collection Time: 05/18/14 10:38 PM  Result Value Ref Range Status   MRSA by PCR NEGATIVE NEGATIVE Final    Comment:        The GeneXpert MRSA Assay (FDA approved for NASAL specimens only), is one component of a comprehensive MRSA colonization surveillance program. It is not intended to diagnose MRSA infection nor to guide or monitor treatment for MRSA infections.   Culture, blood (routine x 2)     Status: None (Preliminary result)   Collection Time: 05/19/14  3:15 AM  Result Value Ref Range Status   Specimen Description BLOOD RIGHT ARM  Final   Special Requests BOTTLES DRAWN AEROBIC AND ANAEROBIC 5CC  Final   Culture   Final           BLOOD CULTURE RECEIVED NO GROWTH TO DATE CULTURE WILL BE HELD FOR 5 DAYS BEFORE ISSUING A FINAL NEGATIVE REPORT Note: Culture results may be compromised due to an inadequate volume of blood received in culture bottles. Performed at [facility]    Report Status PENDING  Incomplete  Culture, blood (routine x 2)     Status: None (Preliminary result)   Collection Time: 05/19/14  5:27 AM  Result Value Ref Range Status   Specimen Description BLOOD RIGHT HAND  Final   Special Requests BOTTLES DRAWN AEROBIC ONLY 3CC  Final   Culture   Final           BLOOD CULTURE RECEIVED NO GROWTH TO DATE CULTURE WILL BE HELD FOR 5 DAYS BEFORE ISSUING A FINAL NEGATIVE REPORT Performed at [facility]    Report Status PENDING  Incomplete     Labs: Basic Metabolic  Panel:  Recent Labs Lab 05/18/14 1545 05/18/14 2202 05/19/14 0527 05/20/14 0640 05/21/14 0643  NA 137  --  136 139 137  K 4.9  --  7.1* 4.3 4.7  CL 100  --  98 94* 93*  CO2  --   --  21 24 25   GLUCOSE 107*  --  123* 110* 112*  BUN 83*  --  90* 53* 69*  CREATININE 15.10* 16.43* 16.95* 12.93* 16.41*  CALCIUM  --   --  8.1* 8.1* 8.2*  PHOS  --   --   --   --  10.4*   Liver Function Tests:  Recent Labs Lab 05/21/14 0643  ALBUMIN 3.0*   No results for input(s): LIPASE, AMYLASE in the last 168 hours. No results for input(s): AMMONIA in the last 168 hours. CBC:  Recent Labs Lab 05/18/14 1526 05/18/14 1545 05/18/14 2202 05/19/14 0315 05/20/14 0640 05/21/14 0620  WBC 9.0  --  8.8 9.6 6.4 6.0  NEUTROABS 7.6  --   --   --   --   --   HGB 9.9* 12.6* 10.4* 9.5* 10.0* 10.3*  HCT 32.7* 37.0* 33.9* 30.9* 33.8* 34.4*  MCV 93.7  --  94.7 96.0 96.8 96.9  PLT 131*  --  141* 168 178 221   Cardiac Enzymes: No results for input(s): CKTOTAL, CKMB, CKMBINDEX, TROPONINI in the last 168 hours. BNP: BNP (last 3 results) No results for input(s): BNP in the last 8760 hours.  ProBNP (last 3 results) No results for input(s): PROBNP in the last 8760 hours.  CBG:  Recent Labs Lab 05/18/14 1514  GLUCAP 99       Signed:  Taliyah Watrous  [facility] 05/22/2014, 11:51 AM

## 2014-05-21 NOTE — Progress Notes (Signed)
PROGRESS NOTE  Gary Rivera EZM:[id number] DOB: [birth year] DOA: 05/18/2014 PCP: [name], MD  11 M with ESRD and CHF, s/p septic knee washout 2/28 evening. Could not be extubated due to prolonged somnolence post op. Transferred from PACU to ICU intubated. PCCM to asked ot assume care while in ICU. Initially admitted by [facility] service  Assessment/Plan: septic arthritis- right knee: s/p wash out -ID following for abx: d/c on 4 weeks of vanc if outpatient HD can be arranged  ESRD (home HD); consulted nephrology for management   H/o PAF (Per patient: he was on coumadin for short time, then he stopped after discussions with his cardiologist Dr Revonda Humphrey due to problems with his dialysis);  patient did not want to continue anticoagulation   H/o CHF, echo (06/2013): LVEF 25-30%, LVH, dilated LA, RA, RVsevere pulmonary HTN -on repeat echo LV improved to 45% at [facility]; patient denies cardiopulmonary symptoms  Probable undiagnosed OSA: needs outpatient polysomnography; BiPAP as needed  -chronic resp failure -wears O2 at home  Code Status: full Family Communication: patient Disposition Plan: home once outpatient HD arranged -home health -outpatient sleep study   Consultants:  ID  Ortho  renal  Procedures:      HPI/Subjective: In dialysis- wants nocturnal dialysis   Objective: Filed Vitals:   05/21/14 1207  BP: 106/52  Pulse: 92  Temp: 98.3 F (36.8 C)  Resp: 20    Intake/Output Summary (Last 24 hours) at 05/21/14 1400 Last data filed at 05/21/14 1207  Gross per 24 hour  Intake    920 ml  Output   2500 ml  Net  -1580 ml   Filed Weights   05/20/14 2039 05/21/14 0720 05/21/14 1207  Weight: 144.788 kg (319 lb 3.2 oz) 146.1 kg (322 lb 1.5 oz) 143.2 kg (315 lb 11.2 oz)    Exam:   General:  Pleasant/cooperative  Cardiovascular: rrr  Respiratory: diminished throughout  Abdomen: obese  Musculoskeletal: knee wrapped   Data Reviewed: Basic  Metabolic Panel:  Recent Labs Lab 05/18/14 1545 05/18/14 2202 05/19/14 0527 05/20/14 0640 05/21/14 0643  NA 137  --  136 139 137  K 4.9  --  7.1* 4.3 4.7  CL 100  --  98 94* 93*  CO2  --   --  21 24 25   GLUCOSE 107*  --  123* 110* 112*  BUN 83*  --  90* 53* 69*  CREATININE 15.10* 16.43* 16.95* 12.93* 16.41*  CALCIUM  --   --  8.1* 8.1* 8.2*  PHOS  --   --   --   --  10.4*   Liver Function Tests:  Recent Labs Lab 05/21/14 0643  ALBUMIN 3.0*   No results for input(s): LIPASE, AMYLASE in the last 168 hours. No results for input(s): AMMONIA in the last 168 hours. CBC:  Recent Labs Lab 05/18/14 1526 05/18/14 1545 05/18/14 2202 05/19/14 0315 05/20/14 0640 05/21/14 0620  WBC 9.0  --  8.8 9.6 6.4 6.0  NEUTROABS 7.6  --   --   --   --   --   HGB 9.9* 12.6* 10.4* 9.5* 10.0* 10.3*  HCT 32.7* 37.0* 33.9* 30.9* 33.8* 34.4*  MCV 93.7  --  94.7 96.0 96.8 96.9  PLT 131*  --  141* 168 178 221   Cardiac Enzymes: No results for input(s): CKTOTAL, CKMB, CKMBINDEX, TROPONINI in the last 168 hours. BNP (last 3 results) No results for input(s): BNP in the last 8760 hours.  ProBNP (last 3 results) No  results for input(s): PROBNP in the last 8760 hours.  CBG:  Recent Labs Lab 05/18/14 1514  GLUCAP 99    Recent Results (from the past 240 hour(s))  Body fluid culture     Status: None (Preliminary result)   Collection Time: 05/18/14  2:51 PM  Result Value Ref Range Status   Specimen Description FLUID  Final   Special Requests Normal  Final   Gram Stain   Final    CYTOSPIN SLIDE WBC PRESENT, PREDOMINANTLY PMN NO ORGANISMS SEEN Performed at [facility] Performed at [facility]    Culture   Final    NO GROWTH 2 DAYS Performed at [facility]    Report Status PENDING  Incomplete  Gram stain     Status: None   Collection Time: 05/18/14  2:51 PM  Result Value Ref Range Status   Specimen Description FLUID  Final   Special Requests Normal  Final     Gram Stain   Final    CYTOSPIN SLIDE WBC PRESENT, PREDOMINANTLY PMN NO ORGANISMS SEEN    Report Status 05/18/2014 FINAL  Final  Anaerobic culture     Status: None (Preliminary result)   Collection Time: 05/18/14  8:25 PM  Result Value Ref Range Status   Specimen Description FLUID SYNOVIAL RIGHT KNEE  Final   Special Requests PATIENT ON FOLLOWING ROCEPHIN,VANCOMYCIN  Final   Gram Stain   Final    ABUNDANT WBC PRESENT, PREDOMINANTLY PMN NO ORGANISMS SEEN Performed at [facility]    Culture   Final    NO ANAEROBES ISOLATED; CULTURE IN PROGRESS FOR 5 DAYS Performed at [facility]    Report Status PENDING  Incomplete  Body fluid culture     Status: None (Preliminary result)   Collection Time: 05/18/14  8:25 PM  Result Value Ref Range Status   Specimen Description SYNOVIAL RIGHT KNEE  Final   Special Requests ROCEPHIN,VANCOMYCIN  Final   Gram Stain   Final    ABUNDANT WBC PRESENT, PREDOMINANTLY PMN NO ORGANISMS SEEN Performed at [facility]    Culture   Final    NO GROWTH 2 DAYS Performed at [facility]    Report Status PENDING  Incomplete  MRSA PCR Screening     Status: None   Collection Time: 05/18/14 10:38 PM  Result Value Ref Range Status   MRSA by PCR NEGATIVE NEGATIVE Final    Comment:        The GeneXpert MRSA Assay (FDA approved for NASAL specimens only), is one component of a comprehensive MRSA colonization surveillance program. It is not intended to diagnose MRSA infection nor to guide or monitor treatment for MRSA infections.   Culture, blood (routine x 2)     Status: None (Preliminary result)   Collection Time: 05/19/14  3:15 AM  Result Value Ref Range Status   Specimen Description BLOOD RIGHT ARM  Final   Special Requests BOTTLES DRAWN AEROBIC AND ANAEROBIC 5CC  Final   Culture   Final           BLOOD CULTURE RECEIVED NO GROWTH TO DATE CULTURE WILL BE HELD FOR 5 DAYS BEFORE ISSUING A FINAL NEGATIVE REPORT Note:  Culture results may be compromised due to an inadequate volume of blood received in culture bottles. Performed at [facility]    Report Status PENDING  Incomplete  Culture, blood (routine x 2)     Status: None (Preliminary result)   Collection Time: 05/19/14  5:27 AM  Result Value Ref Range Status   Specimen Description BLOOD RIGHT HAND  Final   Special Requests BOTTLES DRAWN AEROBIC ONLY 3CC  Final   Culture   Final           BLOOD CULTURE RECEIVED NO GROWTH TO DATE CULTURE WILL BE HELD FOR 5 DAYS BEFORE ISSUING A FINAL NEGATIVE REPORT Performed at [facility]    Report Status PENDING  Incomplete     Studies: No results found.  Scheduled Meds: . antiseptic oral rinse  7 mL Mouth Rinse BID  . calcitRIOL  0.5 mcg Oral Daily  . ceFEPime (MAXIPIME) IV  2 g Intravenous Q M,W,F-1800  . darbepoetin (ARANESP) injection - DIALYSIS  40 mcg Intravenous Q Mon-HD  . heparin  5,000 Units Subcutaneous 3 times per day  . lanthanum  2,000 mg Oral TID WC  . metoprolol tartrate  12.5 mg Oral BID  . multivitamin  1 tablet Oral QHS  . pantoprazole  40 mg Oral Daily  . sodium chloride  3 mL Intravenous Q12H  . ursodiol  300 mg Oral TID  . vancomycin  1,000 mg Intravenous Q M,W,F-HD   Continuous Infusions:  Antibiotics Given (last 72 hours)    Date/Time Action Medication Dose Rate   05/18/14 1845 Given  [Started prior to holding room.  Infusing upon arrival]   vancomycin (VANCOCIN) 2,500 mg in sodium chloride 0.9 % 500 mL IVPB 2,500 mg    05/19/14 0006 Given  [sent late from pharmacy]   piperacillin-tazobactam (ZOSYN) IVPB 2.25 g 2.25 g 100 mL/hr   05/19/14 0516 Given   piperacillin-tazobactam (ZOSYN) IVPB 2.25 g 2.25 g 100 mL/hr   05/19/14 1208 Given   vancomycin (VANCOCIN) IVPB 1000 mg/200 mL premix 1,000 mg 200 mL/hr   05/19/14 1520 Given   ceFEPIme (MAXIPIME) 2 g in dextrose 5 % 50 mL IVPB 2 g 100 mL/hr   05/21/14 1102 Given   vancomycin (VANCOCIN) IVPB 1000 mg/200 mL  premix 1,000 mg 200 mL/hr      Principal Problem:   Septic arthritis Active Problems:   Chronic systolic congestive heart failure, NYHA class 1   HTN (hypertension)   Atrial flutter with RVR   ESRD on hemodialysis   Normocytic anemia    Time spent: 25 min    VANN, JESSICA  [facility] Pager [phone]. If 7PM-7AM, please contact night-coverage at [email or web address], password [facility] 05/21/2014, 2:00 PM  LOS: 3 days

## 2014-05-21 NOTE — Progress Notes (Addendum)
PT Cancellation Note  Patient Details Name: VIRGINIA HARLEMAN MRN: [id number] DOB: [birth year]   Cancelled Treatment:    Reason Eval/Treat Not Completed: Patient at procedure or test/unavailable. Pt off unit for HD. Will check back as schedule allows.   Conni Slipper 05/21/2014, 12:11 PM   Conni Slipper, PT, DPT Acute Rehabilitation Services Pager: [phone]

## 2014-05-22 LAB — BODY FLUID CULTURE
CULTURE: NO GROWTH
Culture: NO GROWTH
Special Requests: NORMAL

## 2014-05-23 LAB — ANAEROBIC CULTURE

## 2014-05-25 LAB — CULTURE, BLOOD (ROUTINE X 2)
CULTURE: NO GROWTH
Culture: NO GROWTH

## 2014-05-31 ENCOUNTER — Other Ambulatory Visit: Payer: Medicare Other

## 2014-06-25 ENCOUNTER — Emergency Department (HOSPITAL_COMMUNITY): Payer: Medicare Other

## 2014-06-25 ENCOUNTER — Inpatient Hospital Stay (HOSPITAL_COMMUNITY)
Admission: EM | Admit: 2014-06-25 | Discharge: 2014-06-27 | DRG: 189 | Disposition: A | Discharge status: Home / Self Care | Payer: Medicare Other | Attending: Family Medicine | Admitting: Family Medicine

## 2014-06-25 ENCOUNTER — Encounter (HOSPITAL_COMMUNITY): Payer: Self-pay | Admitting: *Deleted

## 2014-06-25 DIAGNOSIS — D638 Anemia in other chronic diseases classified elsewhere: Secondary | ICD-10-CM | POA: Diagnosis present

## 2014-06-25 DIAGNOSIS — J810 Acute pulmonary edema: Secondary | ICD-10-CM | POA: Diagnosis present

## 2014-06-25 DIAGNOSIS — N186 End stage renal disease: Secondary | ICD-10-CM | POA: Diagnosis present

## 2014-06-25 DIAGNOSIS — Z9981 Dependence on supplemental oxygen: Secondary | ICD-10-CM | POA: Diagnosis not present

## 2014-06-25 DIAGNOSIS — G4733 Obstructive sleep apnea (adult) (pediatric): Secondary | ICD-10-CM | POA: Diagnosis present

## 2014-06-25 DIAGNOSIS — I272 Other secondary pulmonary hypertension: Secondary | ICD-10-CM | POA: Diagnosis present

## 2014-06-25 DIAGNOSIS — I5022 Chronic systolic (congestive) heart failure: Secondary | ICD-10-CM | POA: Diagnosis not present

## 2014-06-25 DIAGNOSIS — I10 Essential (primary) hypertension: Secondary | ICD-10-CM | POA: Diagnosis present

## 2014-06-25 DIAGNOSIS — R059 Cough, unspecified: Secondary | ICD-10-CM | POA: Diagnosis present

## 2014-06-25 DIAGNOSIS — N2581 Secondary hyperparathyroidism of renal origin: Secondary | ICD-10-CM | POA: Diagnosis present

## 2014-06-25 DIAGNOSIS — E8770 Fluid overload, unspecified: Secondary | ICD-10-CM | POA: Diagnosis present

## 2014-06-25 DIAGNOSIS — Z992 Dependence on renal dialysis: Secondary | ICD-10-CM | POA: Diagnosis not present

## 2014-06-25 DIAGNOSIS — J9601 Acute respiratory failure with hypoxia: Secondary | ICD-10-CM | POA: Diagnosis present

## 2014-06-25 DIAGNOSIS — R0902 Hypoxemia: Secondary | ICD-10-CM | POA: Insufficient documentation

## 2014-06-25 DIAGNOSIS — F4310 Post-traumatic stress disorder, unspecified: Secondary | ICD-10-CM | POA: Diagnosis present

## 2014-06-25 DIAGNOSIS — J9622 Acute and chronic respiratory failure with hypercapnia: Secondary | ICD-10-CM | POA: Diagnosis present

## 2014-06-25 DIAGNOSIS — R0602 Shortness of breath: Secondary | ICD-10-CM

## 2014-06-25 DIAGNOSIS — I5042 Chronic combined systolic (congestive) and diastolic (congestive) heart failure: Secondary | ICD-10-CM | POA: Diagnosis present

## 2014-06-25 DIAGNOSIS — R05 Cough: Secondary | ICD-10-CM | POA: Diagnosis present

## 2014-06-25 DIAGNOSIS — I120 Hypertensive chronic kidney disease with stage 5 chronic kidney disease or end stage renal disease: Secondary | ICD-10-CM | POA: Diagnosis present

## 2014-06-25 DIAGNOSIS — E872 Acidosis: Secondary | ICD-10-CM | POA: Diagnosis present

## 2014-06-25 DIAGNOSIS — I340 Nonrheumatic mitral (valve) insufficiency: Secondary | ICD-10-CM | POA: Diagnosis present

## 2014-06-25 DIAGNOSIS — F411 Generalized anxiety disorder: Secondary | ICD-10-CM | POA: Diagnosis present

## 2014-06-25 DIAGNOSIS — E8729 Other acidosis: Secondary | ICD-10-CM

## 2014-06-25 DIAGNOSIS — J811 Chronic pulmonary edema: Secondary | ICD-10-CM | POA: Diagnosis present

## 2014-06-25 DIAGNOSIS — E662 Morbid (severe) obesity with alveolar hypoventilation: Secondary | ICD-10-CM | POA: Diagnosis present

## 2014-06-25 DIAGNOSIS — Z6841 Body Mass Index (BMI) 40.0 and over, adult: Secondary | ICD-10-CM | POA: Diagnosis not present

## 2014-06-25 DIAGNOSIS — J9620 Acute and chronic respiratory failure, unspecified whether with hypoxia or hypercapnia: Secondary | ICD-10-CM | POA: Diagnosis present

## 2014-06-25 LAB — CBC
HCT: 33.1 % — ABNORMAL LOW (ref 39.0–52.0)
Hemoglobin: 9.8 g/dL — ABNORMAL LOW (ref 13.0–17.0)
MCH: 27.7 pg (ref 26.0–34.0)
MCHC: 29.6 g/dL — ABNORMAL LOW (ref 30.0–36.0)
MCV: 93.5 fL (ref 78.0–100.0)
Platelets: 138 10*3/uL — ABNORMAL LOW (ref 150–400)
RBC: 3.54 MIL/uL — ABNORMAL LOW (ref 4.22–5.81)
RDW: 17.1 % — ABNORMAL HIGH (ref 11.5–15.5)
WBC: 5.8 10*3/uL (ref 4.0–10.5)

## 2014-06-25 LAB — I-STAT CHEM 8, ED
BUN: 82 mg/dL — ABNORMAL HIGH (ref 6–23)
CHLORIDE: 104 mmol/L (ref 96–112)
Calcium, Ion: 0.98 mmol/L — ABNORMAL LOW (ref 1.12–1.23)
Creatinine, Ser: 17.7 mg/dL — ABNORMAL HIGH (ref 0.50–1.35)
Glucose, Bld: 134 mg/dL — ABNORMAL HIGH (ref 70–99)
HCT: 38 % — ABNORMAL LOW (ref 39.0–52.0)
Hemoglobin: 12.9 g/dL — ABNORMAL LOW (ref 13.0–17.0)
POTASSIUM: 4.4 mmol/L (ref 3.5–5.1)
SODIUM: 145 mmol/L (ref 135–145)
TCO2: 26 mmol/L (ref 0–100)

## 2014-06-25 LAB — COMPREHENSIVE METABOLIC PANEL
ALBUMIN: 3.6 g/dL (ref 3.5–5.2)
ALK PHOS: 47 U/L (ref 39–117)
ALT: 16 U/L (ref 0–53)
AST: 18 U/L (ref 0–37)
Anion gap: 13 (ref 5–15)
BILIRUBIN TOTAL: 0.6 mg/dL (ref 0.3–1.2)
BUN: 84 mg/dL — ABNORMAL HIGH (ref 6–23)
CHLORIDE: 101 mmol/L (ref 96–112)
CO2: 31 mmol/L (ref 19–32)
CREATININE: 18.83 mg/dL — AB (ref 0.50–1.35)
Calcium: 8.1 mg/dL — ABNORMAL LOW (ref 8.4–10.5)
GFR calc non Af Amer: 2 mL/min — ABNORMAL LOW (ref >90)
GFR, EST AFRICAN AMERICAN: 3 mL/min — AB (ref >90)
GLUCOSE: 138 mg/dL — AB (ref 70–99)
POTASSIUM: 4.5 mmol/L (ref 3.5–5.1)
Sodium: 145 mmol/L (ref 135–145)
Total Protein: 7.1 g/dL (ref 6.0–8.3)

## 2014-06-25 LAB — I-STAT ARTERIAL BLOOD GAS, ED
Acid-Base Excess: 1 mmol/L (ref 0.0–2.0)
BICARBONATE: 29.7 meq/L — AB (ref 20.0–24.0)
O2 SAT: 94 %
PH ART: 7.239 — AB (ref 7.350–7.450)
PO2 ART: 84 mmHg (ref 80.0–100.0)
Patient temperature: 98
TCO2: 32 mmol/L (ref 0–100)
pCO2 arterial: 69.2 mmHg (ref 35.0–45.0)

## 2014-06-25 LAB — CBC WITH DIFFERENTIAL/PLATELET
BASOS ABS: 0 10*3/uL (ref 0.0–0.1)
BASOS PCT: 0 % (ref 0–1)
Eosinophils Absolute: 0.2 10*3/uL (ref 0.0–0.7)
Eosinophils Relative: 3 % (ref 0–5)
HCT: 34.1 % — ABNORMAL LOW (ref 39.0–52.0)
HEMOGLOBIN: 9.9 g/dL — AB (ref 13.0–17.0)
LYMPHS PCT: 15 % (ref 12–46)
Lymphs Abs: 0.9 10*3/uL (ref 0.7–4.0)
MCH: 27.6 pg (ref 26.0–34.0)
MCHC: 29 g/dL — ABNORMAL LOW (ref 30.0–36.0)
MCV: 95 fL (ref 78.0–100.0)
Monocytes Absolute: 0.4 10*3/uL (ref 0.1–1.0)
Monocytes Relative: 7 % (ref 3–12)
NEUTROS PCT: 75 % (ref 43–77)
Neutro Abs: 4.6 10*3/uL (ref 1.7–7.7)
Platelets: 180 10*3/uL (ref 150–400)
RBC: 3.59 MIL/uL — ABNORMAL LOW (ref 4.22–5.81)
RDW: 17 % — ABNORMAL HIGH (ref 11.5–15.5)
WBC: 6.1 10*3/uL (ref 4.0–10.5)

## 2014-06-25 LAB — CREATININE, SERUM
Creatinine, Ser: 12.58 mg/dL — ABNORMAL HIGH (ref 0.50–1.35)
GFR calc non Af Amer: 4 mL/min — ABNORMAL LOW (ref >90)
GFR, EST AFRICAN AMERICAN: 5 mL/min — AB (ref >90)

## 2014-06-25 LAB — BRAIN NATRIURETIC PEPTIDE: B Natriuretic Peptide: 1060.8 pg/mL — ABNORMAL HIGH (ref 0.0–100.0)

## 2014-06-25 LAB — TSH: TSH: 4.095 u[IU]/mL (ref 0.350–4.500)

## 2014-06-25 MED ORDER — CETYLPYRIDINIUM CHLORIDE 0.05 % MT LIQD
7.0000 mL | Freq: Two times a day (BID) | OROMUCOSAL | Status: DC
  Administered 2014-06-26 (×2): 7 mL via OROMUCOSAL

## 2014-06-25 MED ORDER — LANTHANUM CARBONATE 500 MG PO CHEW
2000.0000 mg | CHEWABLE_TABLET | Freq: Three times a day (TID) | ORAL | Status: DC
  Administered 2014-06-26 – 2014-06-27 (×4): 2000 mg via ORAL
  Filled 2014-06-25 (×8): qty 4

## 2014-06-25 MED ORDER — METOCLOPRAMIDE HCL 10 MG PO TABS
10.0000 mg | ORAL_TABLET | Freq: Every day | ORAL | Status: DC
  Administered 2014-06-26 – 2014-06-27 (×2): 10 mg via ORAL
  Filled 2014-06-25 (×2): qty 1

## 2014-06-25 MED ORDER — ALBUTEROL SULFATE (2.5 MG/3ML) 0.083% IN NEBU
3.0000 mL | INHALATION_SOLUTION | Freq: Four times a day (QID) | RESPIRATORY_TRACT | Status: DC | PRN
  Administered 2014-06-27: 3 mL via RESPIRATORY_TRACT
  Filled 2014-06-25: qty 3

## 2014-06-25 MED ORDER — CITALOPRAM HYDROBROMIDE 20 MG PO TABS
20.0000 mg | ORAL_TABLET | Freq: Every day | ORAL | Status: DC
  Administered 2014-06-25 – 2014-06-26 (×2): 20 mg via ORAL
  Filled 2014-06-25 (×3): qty 1

## 2014-06-25 MED ORDER — ALBUTEROL SULFATE HFA 108 (90 BASE) MCG/ACT IN AERS: 1.0000 | INHALATION_SPRAY | Freq: Four times a day (QID) | RESPIRATORY_TRACT | Status: DC | PRN

## 2014-06-25 MED ORDER — LANTHANUM CARBONATE 500 MG PO CHEW: 500.0000 mg | CHEWABLE_TABLET | ORAL | Status: DC | PRN

## 2014-06-25 MED ORDER — CLONAZEPAM 1 MG PO TABS: 1.0000 mg | ORAL_TABLET | Freq: Two times a day (BID) | ORAL | Status: DC | PRN

## 2014-06-25 MED ORDER — HEPARIN SODIUM (PORCINE) 5000 UNIT/ML IJ SOLN
5000.0000 [IU] | Freq: Three times a day (TID) | INTRAMUSCULAR | Status: DC
  Administered 2014-06-25 – 2014-06-27 (×4): 5000 [IU] via SUBCUTANEOUS
  Filled 2014-06-25 (×7): qty 1

## 2014-06-25 MED ORDER — SODIUM CHLORIDE 0.9 % IV SOLN: 250.0000 mL | INTRAVENOUS | Status: DC | PRN

## 2014-06-25 MED ORDER — RENA-VITE PO TABS
1.0000 | ORAL_TABLET | Freq: Every day | ORAL | Status: DC
Start: 2014-06-25 — End: 2014-06-27
  Administered 2014-06-25 – 2014-06-26 (×2): 1 via ORAL
  Filled 2014-06-25 (×3): qty 1

## 2014-06-25 MED ORDER — OXYCODONE HCL 5 MG PO TABS: 10.0000 mg | ORAL_TABLET | Freq: Three times a day (TID) | ORAL | Status: DC | PRN

## 2014-06-25 MED ORDER — LANTHANUM CARBONATE 500 MG PO CHEW: 1000.0000 mg | CHEWABLE_TABLET | ORAL | Status: DC | PRN

## 2014-06-25 MED ORDER — SODIUM CHLORIDE 0.9 % IJ SOLN: 3.0000 mL | INTRAMUSCULAR | Status: DC | PRN

## 2014-06-25 MED ORDER — SODIUM CHLORIDE 0.9 % IJ SOLN
3.0000 mL | Freq: Two times a day (BID) | INTRAMUSCULAR | Status: DC
  Administered 2014-06-25 – 2014-06-27 (×4): 3 mL via INTRAVENOUS

## 2014-06-25 MED ORDER — CALCITRIOL 0.25 MCG PO CAPS
0.2500 ug | ORAL_CAPSULE | Freq: Every day | ORAL | Status: DC
  Filled 2014-06-25: qty 1

## 2014-06-25 MED ORDER — SODIUM CHLORIDE 0.9 % IJ SOLN
3.0000 mL | Freq: Two times a day (BID) | INTRAMUSCULAR | Status: DC
Start: 2014-06-25 — End: 2014-06-25

## 2014-06-25 MED ORDER — LANTHANUM CARBONATE 500 MG PO CHEW: 1000.0000 mg | CHEWABLE_TABLET | Freq: Three times a day (TID) | ORAL | Status: DC

## 2014-06-25 NOTE — Progress Notes (Signed)
ABG results given to Dr Hyacinth Meeker.

## 2014-06-25 NOTE — Progress Notes (Signed)
New Admission Note:   Arrival Method:  VIA STRETCHER FROM HEMODIUALYSIS Mental Orientation: A&OX4 Telemetry: NSR Assessment: Completed Skin: DRY, FLAKY. SURGICAL SCARS ON RIGHT KNEE IV:NSL Pain: DENIES Tubes:N/A Safety Measures: Safety Fall Prevention Plan has been given, discussed and signed Admission: Completed [address] Orientation: Patient has been orientated to the room, unit and staff.  Family: NONE ORESENT  Orders have been reviewed and implemented. Will continue to monitor the patient. Call light has been placed within reach and bed alarm has been activated.   De Nurse BSN, Publishing copy number: [phone]

## 2014-06-25 NOTE — ED Notes (Signed)
Attempted report X1

## 2014-06-25 NOTE — Progress Notes (Signed)
ABG attempted x2 with no success. RT charge contacted for additional attempt. RN aware.

## 2014-06-25 NOTE — ED Notes (Signed)
Pt reports last dialysis was Saturday. Only complaint is generalized fatigue and depression. Denies SI or HI. spo2 90% at triage.

## 2014-06-25 NOTE — Consult Note (Signed)
Renal Service Consult Note [facility]  Gary Rivera 06/25/2014 Gary Rivera D Requesting Physician:  Dr Gary Rivera  Reason for Consult:  ESRD pt on home HD with SOB, pulm edema HPI: The patient is a 54 y.o. year-old with hx of HTN, anemia, and ESRD on HD at home.  Was previously on PD.  Presented to ED with cough and fever. Temp 100.2 in ED. Had full HD yesterday at home, missed the HD before over the weekend. He says he got about 4 weeks of abx with HD for an infected R knee and got in-center HD during that time.  He denies CP. He does have some SOB and was hypoxic in ED. CXR showed pulm edema.  Asked to see pt for dialysis.   Past Medical History  Past Medical History  Diagnosis Date  . ESRD on hemodialysis     Started dialysis with PD in 2013 and switched to HD in spring 2014 because of fungal peritonitis.  Takes dialysis at home 5 days a week approx 3h 15 min each session.     . CHF (congestive heart failure)   . Peritoneal dialysis catheter in place   . Anemia     patient reporats that last hgb 7.9 a week ago  . Anxiety state, unspecified   . Hypertension    Past Surgical History  Past Surgical History  Procedure Laterality Date  . Appendectomy    . Peritoneal catheter insertion    . Tee without cardioversion N/A 07/11/2013    Procedure: TRANSESOPHAGEAL ECHOCARDIOGRAM (TEE);  Surgeon: Gary Riffle, MD;  Location: [facility] ENDOSCOPY;  Service: Cardiovascular;  Laterality: N/A;  . Cardioversion N/A 07/11/2013    Procedure: CARDIOVERSION;  Surgeon: Gary Riffle, MD;  Location: [facility] ENDOSCOPY;  Service: Cardiovascular;  Laterality: N/A;  . Knee arthroscopy Right 05/18/2014    Procedure: ARTHROSCOPIC WASHOUT OF RIGHT KNEE;  Surgeon: Gary Apley, MD;  Location: [facility] OR;  Service: Orthopedics;  Laterality: Right;   Family History  Family History  Problem Relation Age of Onset  . Diabetes Mother   . Hypertension Mother   . Hypertension Sister   . Asthma Sister     Social History  reports that he has never smoked. He has never used smokeless tobacco. He reports that he does not drink alcohol or use illicit drugs. Allergies  Allergies  Allergen Reactions  . Renvela [Sevelamer] Nausea And Vomiting   Home medications Prior to Admission medications   Medication Sig Start Date End Date Taking? Authorizing Provider  albuterol (PROVENTIL HFA;VENTOLIN HFA) 108 (90 BASE) MCG/ACT inhaler Inhale 1 puff into the lungs every 6 (six) hours as needed for wheezing or shortness of breath.   Yes Historical Provider, MD  calcitRIOL (ROCALTROL) 0.25 MCG capsule Take 0.25 mcg by mouth daily.   Yes Historical Provider, MD  clonazePAM (KLONOPIN) 0.5 MG tablet Take 1 mg by mouth 2 (two) times daily as needed for anxiety.    Yes Historical Provider, MD  lanthanum (FOSRENOL) 1000 MG chewable tablet Chew 1,000-2,000 mg by mouth 3 (three) times daily with meals. Takes 2 tablets with meals and 1 tablet with snacks.   Yes Historical Provider, MD  metoCLOPramide (REGLAN) 10 MG tablet Take 10 mg by mouth daily. 05/03/14  Yes Historical Provider, MD  multivitamin (RENA-VIT) TABS tablet Take 1 tablet by mouth at bedtime. 05/21/14  Yes Gary U Vann, DO  Omega-3 1000 MG CAPS Take 1 g by mouth 2 (two) times daily.   Yes  Historical Provider, MD  omeprazole (PRILOSEC) 10 MG capsule Take 10 mg by mouth daily.   Yes Historical Provider, MD  Oxycodone HCl 10 MG TABS Take 1 tablet (10 mg total) by mouth 3 (three) times daily as needed. Patient taking differently: Take 10 mg by mouth 3 (three) times daily as needed (pain).  05/21/14  Yes Gary Art, DO  ursodiol (ACTIGALL) 300 MG capsule Take 300 mg by mouth daily.  07/22/13  Yes Historical Provider, MD  vitamin E 400 UNIT capsule Take 400 Units by mouth daily.   Yes Historical Provider, MD  ursodiol (ACTIGALL) 300 MG capsule Take 1 capsule (300 mg total) by mouth 3 (three) times daily. Patient not taking: Reported on 06/25/2014 07/22/13   Gary Lint, MD  vancomycin [facility]) 1 GM/200ML SOLN Inject 200 mLs (1,000 mg total) into the vein every Monday, Wednesday, and Friday with hemodialysis. Patient not taking: Reported on 06/25/2014 05/21/14   Gary Art, DO   Liver Function Tests  Recent Labs Lab 06/25/14 1317  AST 18  ALT 16  ALKPHOS 47  BILITOT 0.6  PROT 7.1  ALBUMIN 3.6   No results for input(s): LIPASE, AMYLASE in the last 168 hours. CBC  Recent Labs Lab 06/25/14 1317 06/25/14 1344  WBC 6.1  --   NEUTROABS 4.6  --   HGB 9.9* 12.9*  HCT 34.1* 38.0*  MCV 95.0  --   PLT 180  --    Basic Metabolic Panel  Recent Labs Lab 06/25/14 1317 06/25/14 1344  NA 145 145  K 4.5 4.4  CL 101 104  CO2 31  --   GLUCOSE 138* 134*  BUN 84* 82*  CREATININE 18.83* 17.70*  CALCIUM 8.1*  --     Filed Vitals:   06/25/14 1348 06/25/14 1531  BP: 125/73 126/80  Pulse: 79   Resp: 25 17  SpO2: 95% 94%   Exam Looks tired, not in distress, swollen some in the face No rash, cyanosis or gangrene Sclera anicteric, throat clear +JVD Chest basilar rales on left , R is clear RRR no MRG, distant heart sounds Abd soft, mod distended, no gross ascites, no HSM Mild LE edema LUE AVF Neuro is alert and Ox 3  CXR mild pulm edema  HD: [facility] 4.5h   143kg   Heparin 60045   L arm AVF Mircera 150 next dose due thursday, calcitriol 1.0 ug tiw   Assessment: 1. SOB / hypoxemia / pulm edema - looks vol overloaded on exam, has possibly lost weight related to depression 2. ESRD on HD 3. Recent septic knee - s/p 4wk IV abx 4. HTN not on BP medication per home list 5. Anemia cont ESA, hold if Hb > 10.5 6. MBD cont meds   Plan- HD tonight and again tomorrow for vol excess.   Gary Moselle MD (pgr) [phone]    [phone] 06/25/2014, 4:42 PM

## 2014-06-25 NOTE — H&P (Signed)
Family Medicine Teaching [facility] Admission History and Physical Service Pager: [phone]  Patient name: Gary Rivera Medical record number: [id number] Date of birth: March 27, 1960 Age: 54 y.o. Gender: male  Primary Care Provider: [name], MD Consultants: Nephrology Code Status: Full  Chief Complaint: Fatigue, depression, dyspnea  Assessment and Plan: Gary Rivera is a 54 y.o. male presenting with dyspnea and pulmonary edema after missing HD as well as fatigue and depression. PMH is significant for ESRD on home HD, HTN, h/o PAF s/p DCCV 2014, anemia requiring tranfusions, and recently completed treatment of septic arthritis.   Acute on chronic hypercapneic respiratory failure: Respiratory acidosis not requiring NIPPV but with increased oxygen requirement by nasal cannula from baseline of 2-3L at home. Decompensation presumably due to pulmonary edema from missing HD. Chronic respiratory failure due to OHS, ?OSA causing pulmonary HTN. History of prolonged intubation 2013 due to pulmonary edema, though appears very stable at this time and likely to improve greatly following HD, so no need for SDU.  - Oxygen by [place] prn to keep saturations > 90%.  - HD as below for pulmonary edema - Will monitor closely. Low threshold for repeat ABG if any signs of distress.  - Consider polysomnography as outpatient (I find no records of this being performed)  Chronic combined CHF: Appears hypervolemic with elevated BNP (unsure of utility/reliability in ESRD). 05/19/2014 echo: mild LVH with grade 1 DD, EF 45-50%. Below previous dry weight, though new lower baseline unknown after weight loss related to depression. No catheterizations or chest pain, ECG stable.  - Correct volume status with HD - Strict I/O, daily weight - Check Hb A1c - Not on beta blocker or ACE inhibitor (became hypotensive on these meds per EMR review)  ESRD: Secondary to HTN. Receives home HD 5 days per week (started with PD 2013  but had fungal peritonitis 2014). Access: LUE AVF.  - Management per nephrology: will need HD tonight for volume overload; no hyperkalemia, symptomatic uremia,   - Daily renal function panel - Secondary HPTH/metabolic bone disease: continue calcitriol  Anemia of chronic disease: Previous iron studies show low iron, elevated ferritin and decreased %sat. Stable, no indication for ESA (Hgb > 10.5) nor tranfusion.  - Check folate, B12 (possibly compounded etiology given normocytosis). Consider repeat iron studies, though I doubt there is utility in this.   Anxiety/depression: Has reliable follow up with counselor but no medical management since trying prozac many years ago. This seems to be pt's primary concern at this time related to extreme fatigue.  - Will start SSRI trial. Discussed possible SEs and expectations of slow improvement.  - Continue home klonopin - Investigation of fatigue: Anemia at baseline. Check TSH.   FEN/GI: Heart healthy diet, saline lock IV Prophylaxis: subcutaneous heparin  Disposition: Admit to FPTS with HD tonight and continued monitoring of respiratory status.   History of Present Illness: Gary Rivera is a 54 y.o. male presenting with fatigue and dyspnea.   Gary Rivera reports increasing shortness of breath over the past month, worst over the past few days. He's also experienced extreme and worsening fatigue and generalized malaise for the past 4 weeks since an admission for a septic knee. He had a washout and continued vancomycin administered at HD. He has completed the course. He reports hopelessness and that people who know him believe he is depressed due to his low energy level. He has had trouble sleeping and decreased appetite which he blames for a loss of weight in the  past 1-2 months. He denies pain with walking but has needed more oxygen than his usual home 2-3 liters. Though he usually has home dialysis 5 days per week he has been receiving in-center HD to  receive vancomycin and missed this yesterday. When his sister arrived to pick him up to go today she believed he looked sick and tired enough to go to the ED. He had a low grade fever on arrival and an increased oxygen requirement as well as pulmonary edema on CXR and hypercapneic respiratory acidosis on ABG.   Review Of Systems: Per HPI with the following additions: + dry cough without wheezing, chest pain, palpitations, or worsening LE swelling.  Otherwise 12 point review of systems was performed and was unremarkable.  Patient Active Problem List   Diagnosis Date Noted  . ESRD on hemodialysis 05/19/2014  . Normocytic anemia 05/19/2014  . Septic arthritis 05/18/2014  . Cholelithiasis 07/22/2013  . 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 Pulmonary hypertension 07/13/2013  . End stage renal disease-  on HD 07/11/2013  . Hypotension 07/11/2013  . NICM- recent EF "Nl" per pt- followed at [facility] 07/11/2013  . Atrial flutter with RVR 07/10/2013  . Nausea & vomiting 02/24/2013  . Peritonitis due to infected peritoneal dialysis catheter 08/03/2012  . Peritoneal dialysis catheter exit site infection 07/07/2012  . Septic shock(785.52) 07/02/2012  . Cough 12/30/2011  . Delirium, acute 09/20/2011  . Hypotension of hemodialysis secondary to volume depletion 09/19/2011  . 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 Pulmonary HTN- 50 mmHg 2011 ECHO 09/08/2011  . Mitral regurgitation (MODERATE) 09/08/2011  . LVH (left ventricular hypertrophy) MODERATE 09/08/2011  . Obesity (BMI 30-39.9) 09/08/2011  . Elevated CPK: myositis vs rhabdomyolosis 09/08/2011  . Thrombocytopenia 09/08/2011  . Acute respiratory failure with hypercapnia 09/08/2011  . Hypercapnia 09/08/2011  . Pulmonary edema 09/08/2011  . Obesity hypoventilation syndrome 09/08/2011  . Acute respiratory failure with hypoxia 09/07/2011  . Chronic systolic congestive heart failure, NYHA class 1 09/07/2011  . HTN (hypertension) 09/07/2011  . Anemia  09/07/2011   Past Medical History: Past Medical History  Diagnosis Date  . ESRD on hemodialysis     Started dialysis with PD in 2013 and switched to HD in spring 2014 because of fungal peritonitis.  Takes dialysis at home 5 days a week approx 3h 15 min each session.     . CHF (congestive heart failure)   . Peritoneal dialysis catheter in place   . Anemia     patient reporats that last hgb 7.9 a week ago  . Anxiety state, unspecified   . Hypertension    Past Surgical History: Past Surgical History  Procedure Laterality Date  . Appendectomy    . Peritoneal catheter insertion    . Tee without cardioversion N/A 07/11/2013    Procedure: TRANSESOPHAGEAL ECHOCARDIOGRAM (TEE);  Surgeon: Pricilla Riffle, MD;  Location: [facility] ENDOSCOPY;  Service: Cardiovascular;  Laterality: N/A;  . Cardioversion N/A 07/11/2013    Procedure: CARDIOVERSION;  Surgeon: Pricilla Riffle, MD;  Location: [facility] ENDOSCOPY;  Service: Cardiovascular;  Laterality: N/A;  . Knee arthroscopy Right 05/18/2014    Procedure: ARTHROSCOPIC WASHOUT OF RIGHT KNEE;  Surgeon: Sheral Apley, MD;  Location: [facility] OR;  Service: Orthopedics;  Laterality: Right;   Social History: History  Substance Use Topics  . Smoking status: Never Smoker   . Smokeless tobacco: Never Used  . Alcohol Use: No   Additional social history: Never smoker.  Please also refer to relevant sections of  EMR.  Family History: Family History  Problem Relation Age of Onset  . Diabetes Mother   . Hypertension Mother   . Hypertension Sister   . Asthma Sister    Allergies and Medications: Allergies  Allergen Reactions  . Renvela [Sevelamer] Nausea And Vomiting   No current facility-administered medications on file prior to encounter.   Current Outpatient Prescriptions on File Prior to Encounter  Medication Sig Dispense Refill  . albuterol (PROVENTIL HFA;VENTOLIN HFA) 108 (90 BASE) MCG/ACT inhaler Inhale 1 puff into the lungs every 6 (six) hours as needed for  wheezing or shortness of breath.    . clonazePAM (KLONOPIN) 0.5 MG tablet Take 1 mg by mouth 2 (two) times daily as needed for anxiety.     Marland Kitchen lanthanum (FOSRENOL) 1000 MG chewable tablet Chew 1,000-2,000 mg by mouth 3 (three) times daily with meals. Takes 2 tablets with meals and 1 tablet with snacks.    . metoCLOPramide (REGLAN) 10 MG tablet Take 10 mg by mouth daily.  11  . multivitamin (RENA-VIT) TABS tablet Take 1 tablet by mouth at bedtime. 30 each 0  . Oxycodone HCl 10 MG TABS Take 1 tablet (10 mg total) by mouth 3 (three) times daily as needed. (Patient taking differently: Take 10 mg by mouth 3 (three) times daily as needed (pain). ) 30 tablet 0  . ursodiol (ACTIGALL) 300 MG capsule Take 1 capsule (300 mg total) by mouth 3 (three) times daily. (Patient not taking: Reported on 06/25/2014) 60 capsule 3  . vancomycin (VANCOCIN) 1 GM/200ML SOLN Inject 200 mLs (1,000 mg total) into the vein every Monday, Wednesday, and Friday with hemodialysis. (Patient not taking: Reported on 06/25/2014) 4000 mL     Objective: BP 124/80 mmHg  Pulse 83  Temp(Src) 98.3 F (36.8 C) (Oral)  Resp 20  Wt 324 lb 1.2 oz (147 kg)  SpO2 96% Exam: General: Obese 53yo man laying still in bed in no distress HEENT: Anicteric sclerae, oropharynx clear Cardiovascular: Regular rate, no murmur, + JVD, mild LE edema,  Respiratory: Bibasilar crackles L > R without rhonchi or wheezing. Speaking in full sentences without retractions on 4L O2 by [place]. Abdomen: Soft, obese and possibly mildly distended, nontender.  Extremities: LUE AVF with +thrill, R knee with well healed surgical scars, no erythema, warmth or effusion.  Skin: no rashes or wounds Neuro: Alert and oriented, no focal deficits appreciated Psych: Speech is normal volume and rate. Mood is depressed with a mildly restricted affect. Thought process is logical and goal directed. No suicidal or homicidal ideation. Does not appear to be responding to any internal  stimuli.  Labs and Imaging: CBC BMET   Recent Labs Lab 06/25/14 1317 06/25/14 1344  WBC 6.1  --   HGB 9.9* 12.9*  HCT 34.1* 38.0*  PLT 180  --     Recent Labs Lab 06/25/14 1317 06/25/14 1344  NA 145 145  K 4.5 4.4  CL 101 104  CO2 31  --   BUN 84* 82*  CREATININE 18.83* 17.70*  GLUCOSE 138* 134*  CALCIUM 8.1*  --      CXR: Mild pulmonary edema on my read ECG: Biphasic T waves in lateral precordial leads and flipped T waves in I and aVL UNCHANGED from previous. No ST segment changes.  PHART 7.239*  PCO2ART 69.2*  PO2ART 84.0  HCO3 29.7*  TCO2 32  ACIDBASEDEF 1.0  O2SAT 94.0    Tyrone Nine, MD 06/25/2014, 6:32 PM PGY-2, [facility]  [place] [facility] pager: [phone], text pages welcome

## 2014-06-25 NOTE — ED Provider Notes (Signed)
The patient is a 54 year old male, he is end-stage renal disease on dialysis, he does home dialysis through his left forearm fistula, he states that over the last month he has been going to the dialysis center and getting vancomycin after he was diagnosed with septic arthritis and had an orthopedic washout. He has had improving symptoms in his knee, he has had gradual increasing fatigue, depression, some increased respiratory difficulty. He notes not having dialysis in over one week but also notes that he was supposed to start dialysis yesterday and did not do it. On exam the patient has abnormal lung findings with mild intermittent rhonchi, he has no wheezing, no rales, normal heart sounds, soft nontender abdomen. His fistula in the left arm has been good thrill, there is no redness tenderness or warmth. X-ray shows likely pulmonary edema, blood work shows renal dysfunction appropriate for patient's status, no hyperkalemia, he will need admission to the hospital as his ABG does show a respiratory acidosis, he likely needs to have dialysis.  Medical screening examination/treatment/procedure(s) were conducted as a shared visit with non-physician practitioner(s) and myself.  I personally evaluated the patient during the encounter.  Clinical Impression:   Final diagnoses:  Hypoxia  Uncompensated respiratory acidosis  Acute pulmonary edema  ESRD (end stage renal disease) on dialysis         Eber Hong, MD 06/26/14 [phone]

## 2014-06-25 NOTE — ED Provider Notes (Signed)
CSN: [id number]     Arrival date & time 06/25/14  1244 History   First MD Initiated Contact with Patient 06/25/14 1301     Chief Complaint  Patient presents with  . Fatigue     (Consider location/radiation/quality/duration/timing/severity/associated sxs/prior Treatment) HPI  Gary Rivera Is a 54 year old male with a past medical history of end-stage renal disease, CHF, on peritoneal home dialysis, chronic anemia, and depression and anxiety. The patient presents today with chief complaint of weakness and shortness of breath. On 05/18/2014. The patient was admitted for septic arthritis of the right knee. He had a washout performed by Dr. Renaye Rakers. Patient has been receiving parenteral vancomycin at home with his dialysis sessions. The patient dialyzes daily. He states he has not dialyzed since Saturday, 4 days ago. His sister is with him today and states that he "looked very bad." The patient states that he had to be intubated for respiratory failure after his surgery and since that time has had worsening shortness of breath over the past month and intermittently had oxygen requirements, which are new. The patient also complains of severe fatigue, worsening depression, anxiety and mood swings. His sister states that he is barely getting out of bed. He denies fevers, chills, myalgias or other signs of systemic infection. The patient denies cough, melena, hematochezia, vertigo or symptoms of presyncope.   Past Medical History  Diagnosis Date  . ESRD on hemodialysis     Started dialysis with PD in 2013 and switched to HD in spring 2014 because of fungal peritonitis.  Takes dialysis at home 5 days a week approx 3h 15 min each session.     . CHF (congestive heart failure)   . Peritoneal dialysis catheter in place   . Anemia     patient reporats that last hgb 7.9 a week ago  . Anxiety state, unspecified   . Hypertension    Past Surgical History  Procedure Laterality Date  . Appendectomy    .  Peritoneal catheter insertion    . Tee without cardioversion N/A 07/11/2013    Procedure: TRANSESOPHAGEAL ECHOCARDIOGRAM (TEE);  Surgeon: Pricilla Riffle, MD;  Location: [facility] ENDOSCOPY;  Service: Cardiovascular;  Laterality: N/A;  . Cardioversion N/A 07/11/2013    Procedure: CARDIOVERSION;  Surgeon: Pricilla Riffle, MD;  Location: [facility] ENDOSCOPY;  Service: Cardiovascular;  Laterality: N/A;  . Knee arthroscopy Right 05/18/2014    Procedure: ARTHROSCOPIC WASHOUT OF RIGHT KNEE;  Surgeon: Sheral Apley, MD;  Location: [facility] OR;  Service: Orthopedics;  Laterality: Right;   Family History  Problem Relation Age of Onset  . Diabetes Mother   . Hypertension Mother   . Hypertension Sister   . Asthma Sister    History  Substance Use Topics  . Smoking status: Never Smoker   . Smokeless tobacco: Never Used  . Alcohol Use: No    Review of Systems  Ten systems reviewed and are negative for acute change, except as noted in the HPI.    Allergies  Renvela  Home Medications   Prior to Admission medications   Medication Sig Start Date End Date Taking? Authorizing Provider  albuterol (PROVENTIL HFA;VENTOLIN HFA) 108 (90 BASE) MCG/ACT inhaler Inhale 1 puff into the lungs every 6 (six) hours as needed for wheezing or shortness of breath.   Yes Historical Provider, MD  calcitRIOL (ROCALTROL) 0.25 MCG capsule Take 0.25 mcg by mouth daily.   Yes Historical Provider, MD  clonazePAM (KLONOPIN) 0.5 MG tablet Take 1 mg by mouth  2 (two) times daily as needed for anxiety.    Yes Historical Provider, MD  lanthanum (FOSRENOL) 1000 MG chewable tablet Chew 1,000-2,000 mg by mouth 3 (three) times daily with meals. Takes 2 tablets with meals and 1 tablet with snacks.   Yes Historical Provider, MD  metoCLOPramide (REGLAN) 10 MG tablet Take 10 mg by mouth daily. 05/03/14  Yes Historical Provider, MD  multivitamin (RENA-VIT) TABS tablet Take 1 tablet by mouth at bedtime. 05/21/14  Yes [name], DO  Omega-3 1000 MG CAPS Take 1  g by mouth 2 (two) times daily.   Yes Historical Provider, MD  omeprazole (PRILOSEC) 10 MG capsule Take 10 mg by mouth daily.   Yes Historical Provider, MD  Oxycodone HCl 10 MG TABS Take 1 tablet (10 mg total) by mouth 3 (three) times daily as needed. Patient taking differently: Take 10 mg by mouth 3 (three) times daily as needed (pain).  05/21/14  Yes [name], DO  ursodiol (ACTIGALL) 300 MG capsule Take 300 mg by mouth daily.  07/22/13  Yes Historical Provider, MD  vitamin E 400 UNIT capsule Take 400 Units by mouth daily.   Yes Historical Provider, MD  ursodiol (ACTIGALL) 300 MG capsule Take 1 capsule (300 mg total) by mouth 3 (three) times daily. Patient not taking: Reported on 06/25/2014 07/22/13   Almond Lint, MD  vancomycin [facility]) 1 GM/200ML SOLN Inject 200 mLs (1,000 mg total) into the vein every Monday, Wednesday, and Friday with hemodialysis. Patient not taking: Reported on 06/25/2014 05/21/14   Selinda Orion Vann, DO   BP 125/73 mmHg  Pulse 79  Resp 25  SpO2 95% Physical Exam  Constitutional: He is oriented to person, place, and time. He appears well-developed and well-nourished. No distress.  Morbidly obese male in no acute distress  HENT:  Head: Normocephalic and atraumatic.  Eyes: Conjunctivae are normal.  Neck: Normal range of motion. No JVD present.  Cardiovascular: Normal rate and regular rhythm.   Pulmonary/Chest: He has no rales.  Shallow effort, rhonchi noted with expiration. Minimal breath sounds heard in bilateral lung bases with fine crackles noted.  Abdominal: Soft. He exhibits distension. He exhibits no mass. There is no tenderness. There is no guarding.  Musculoskeletal: Normal range of motion.  Right knee with minimal swelling, mild tenderness to palpation. Range of motion limited due to pain. Distal pulses intact  Neurological: He is alert and oriented to person, place, and time.  Skin: Skin is warm. He is not diaphoretic.  Psychiatric: His behavior is normal.   Nursing note and vitals reviewed.   ED Course  Procedures (including critical care time) Labs Review Labs Reviewed  CBC WITH DIFFERENTIAL/PLATELET - Abnormal; Notable for the following:    RBC 3.59 (*)    Hemoglobin 9.9 (*)    HCT 34.1 (*)    MCHC 29.0 (*)    RDW 17.0 (*)    All other components within normal limits  I-STAT CHEM 8, ED - Abnormal; Notable for the following:    BUN 82 (*)    Creatinine, Ser 17.70 (*)    Glucose, Bld 134 (*)    Calcium, Ion 0.98 (*)    Hemoglobin 12.9 (*)    HCT 38.0 (*)    All other components within normal limits  COMPREHENSIVE METABOLIC PANEL  BRAIN NATRIURETIC PEPTIDE  BLOOD GAS, ARTERIAL    Imaging Review No results found.   EKG Interpretation None     ED ECG REPORT   Date:  06/25/2014  Rate: 81  Rhythm: normal sinus rhythm  QRS Axis: normal  Intervals: normal  ST/T Wave abnormalities: normal  Conduction Disutrbances:none  Narrative Interpretation:   Old EKG Reviewed: unchanged  I have personally reviewed the EKG tracing and agree with the computerized printout as noted.  MDM   Final diagnoses:  Hypoxia  Uncompensated respiratory acidosis  Acute pulmonary edema  ESRD (end stage renal disease) on dialysis    2:04 PM BP 125/73 mmHg  Pulse 79  Resp 25  SpO2 95% Patient's labs are at baseline. Awaiting cxr. Suspect pulmonary edema although patient states that he is under his dery weight.  2:32 PM BP 125/73 mmHg  Pulse 79  Resp 25  SpO2 95% Patient appears to be hypercarbic with an uncompensated respiratory acidosis. Patient's anemia appears at baseline, BUN and creatinine also. At baseline. Patient elevated pro BNP at 1060.  3:16 PM Patient appears to have pulmonary edema. Not read on xray however there is poor air movement and hypoxia + rales.   Vision admitted by family medicine. I spoken with Dr. Arlean Hopping will take the patient to dialysis for pulmonary edema. Appears safe for admission at this time without  significant respiratory distress.  Arthor Captain, PA-C 06/25/14 1638  Eber Hong, MD 06/26/14 [phone]

## 2014-06-26 ENCOUNTER — Inpatient Hospital Stay (HOSPITAL_COMMUNITY): Payer: Medicare Other

## 2014-06-26 DIAGNOSIS — E872 Acidosis: Secondary | ICD-10-CM

## 2014-06-26 DIAGNOSIS — I10 Essential (primary) hypertension: Secondary | ICD-10-CM

## 2014-06-26 DIAGNOSIS — I5022 Chronic systolic (congestive) heart failure: Secondary | ICD-10-CM

## 2014-06-26 DIAGNOSIS — R0902 Hypoxemia: Secondary | ICD-10-CM | POA: Insufficient documentation

## 2014-06-26 DIAGNOSIS — J9601 Acute respiratory failure with hypoxia: Secondary | ICD-10-CM

## 2014-06-26 DIAGNOSIS — Z992 Dependence on renal dialysis: Secondary | ICD-10-CM

## 2014-06-26 DIAGNOSIS — F329 Major depressive disorder, single episode, unspecified: Secondary | ICD-10-CM

## 2014-06-26 DIAGNOSIS — D638 Anemia in other chronic diseases classified elsewhere: Secondary | ICD-10-CM

## 2014-06-26 DIAGNOSIS — N186 End stage renal disease: Secondary | ICD-10-CM

## 2014-06-26 DIAGNOSIS — J810 Acute pulmonary edema: Secondary | ICD-10-CM

## 2014-06-26 LAB — RENAL FUNCTION PANEL
ANION GAP: 15 (ref 5–15)
Albumin: 3.3 g/dL — ABNORMAL LOW (ref 3.5–5.2)
Albumin: 3.2 g/dL — ABNORMAL LOW (ref 3.5–5.2)
Anion gap: 11 (ref 5–15)
BUN: 60 mg/dL — ABNORMAL HIGH (ref 6–23)
BUN: 28 mg/dL — ABNORMAL HIGH (ref 6–23)
CHLORIDE: 102 mmol/L (ref 96–112)
CO2: 25 mmol/L (ref 19–32)
CO2: 28 mmol/L (ref 19–32)
Calcium: 8.3 mg/dL — ABNORMAL LOW (ref 8.4–10.5)
Calcium: 8.4 mg/dL (ref 8.4–10.5)
Chloride: 100 mmol/L (ref 96–112)
Creatinine, Ser: 15.08 mg/dL — ABNORMAL HIGH (ref 0.50–1.35)
Creatinine, Ser: 8.37 mg/dL — ABNORMAL HIGH (ref 0.50–1.35)
GFR calc Af Amer: 4 mL/min — ABNORMAL LOW (ref >90)
GFR calc Af Amer: 7 mL/min — ABNORMAL LOW (ref >90)
GFR calc non Af Amer: 6 mL/min — ABNORMAL LOW (ref >90)
GFR, EST NON AFRICAN AMERICAN: 3 mL/min — AB (ref >90)
GLUCOSE: 85 mg/dL (ref 70–99)
Glucose, Bld: 123 mg/dL — ABNORMAL HIGH (ref 70–99)
POTASSIUM: 4.9 mmol/L (ref 3.5–5.1)
Phosphorus: 7.2 mg/dL — ABNORMAL HIGH (ref 2.3–4.6)
Phosphorus: 4.2 mg/dL (ref 2.3–4.6)
Potassium: 4.6 mmol/L (ref 3.5–5.1)
SODIUM: 142 mmol/L (ref 135–145)
Sodium: 139 mmol/L (ref 135–145)

## 2014-06-26 LAB — HEPATITIS B SURFACE ANTIGEN: HEP B S AG: NEGATIVE

## 2014-06-26 LAB — MRSA PCR SCREENING: MRSA BY PCR: NEGATIVE

## 2014-06-26 LAB — VITAMIN B12: VITAMIN B 12: 1081 pg/mL — AB (ref 211–911)

## 2014-06-26 MED ORDER — BOOST / RESOURCE BREEZE PO LIQD
1.0000 | Freq: Every day | ORAL | Status: DC
Start: 2014-06-26 — End: 2014-06-27
  Administered 2014-06-26 – 2014-06-27 (×2): 1 via ORAL
  Filled 2014-06-26: qty 1

## 2014-06-26 MED ORDER — CALCITRIOL 0.5 MCG PO CAPS: 1.0000 ug | ORAL_CAPSULE | ORAL | Status: DC

## 2014-06-26 MED ORDER — PANTOPRAZOLE SODIUM 40 MG PO TBEC
40.0000 mg | DELAYED_RELEASE_TABLET | Freq: Every day | ORAL | Status: DC
  Administered 2014-06-26 – 2014-06-27 (×2): 40 mg via ORAL
  Filled 2014-06-26: qty 1

## 2014-06-26 NOTE — Progress Notes (Signed)
INITIAL NUTRITION ASSESSMENT  DOCUMENTATION CODES Per approved criteria  -Morbid Obesity   INTERVENTION: Provide Resource Breeze po once daily, each supplement provides 250 kcal and 9 grams of protein.  Encourage adequate PO intake.  NUTRITION DIAGNOSIS: Increased nutrient needs related to chronic illness, ESRD as evidenced by estimated nutrition needs.   Goal: Pt to meet >/= 90% of their estimated nutrition needs   Monitor:  PO intake, weight trends, labs, I/O's  Reason for Assessment: MST  54 y.o. male  Admitting Dx: Acute respiratory failure with hypoxia  ASSESSMENT: Pt presenting with dyspnea and pulmonary edema after missing HD as well as fatigue and depression. PMH is significant for ESRD on home HD, HTN, h/o PAF s/p DCCV 2014, anemia requiring tranfusions, and recently completed treatment of septic arthritis.   Pt reports his appetite has been improving. Pt reports over the past 1 month he has been having a decreased appetite and has been eating at most only 2 meals a day. Pt reports usually he consumes 3 meals a day with snacks. Weight has been stable however. Current meal completion is 75%. Pt is agreeable to [employer]. RD to order. Pt was encouraged to eat his food at meals.   Pt with no observed significant fat or muscle mass loss.  Labs: Low calcium and GFR. High BUN, creatinine, and phosphorous (7.2).  Height: Ht Readings from Last 1 Encounters:  06/26/14 6\' 2"  (1.88 m)    Weight: Wt Readings from Last 1 Encounters:  06/26/14 326 lb 15.1 oz (148.3 kg)    Ideal Body Weight: 190 lbs  % Ideal Body Weight: 172%  Wt Readings from Last 10 Encounters:  06/26/14 326 lb 15.1 oz (148.3 kg)  05/21/14 315 lb 11.2 oz (143.2 kg)  05/11/14 325 lb (147.419 kg)  01/12/14 304 lb 0.2 oz (137.9 kg)  07/22/13 304 lb (137.893 kg)  07/16/13 309 lb 11.9 oz (140.5 kg)  05/08/13 315 lb 7.7 oz (143.1 kg)  02/23/13 295 lb (133.811 kg)  08/03/12 293 lb 3.2 oz (132.995  kg)  07/16/12 288 lb 9.3 oz (130.9 kg)    Usual Body Weight: 319lbs  % Usual Body Weight: 102%  BMI:  Body mass index is 41.96 kg/(m^2). Morbid obesity  Adjusted body weight: 130.75 kg  Estimated Nutritional Needs: Kcal: 2200-2450 Protein: 130-140 grams Fluid: 1.2 L/day  Skin: +1 RLE, non-pitting LLE edema  Diet Order: Diet renal with fluid restriction Fluid restriction:: 1200 mL Fluid; Room service appropriate?: Yes; Fluid consistency:: Thin  EDUCATION NEEDS: -No education needs identified at this time   Intake/Output Summary (Last 24 hours) at 06/26/14 1542 Last data filed at 06/26/14 0900  Gross per 24 hour  Intake    717 ml  Output   2000 ml  Net  -1283 ml    Last BM: 4/6  Labs:   Recent Labs Lab 06/25/14 1317 06/25/14 1344 06/25/14 2040 06/26/14 0845  NA 145 145  --  142  K 4.5 4.4  --  4.9  CL 101 104  --  102  CO2 31  --   --  25  BUN 84* 82*  --  60*  CREATININE 18.83* 17.70* 12.58* 15.08*  CALCIUM 8.1*  --   --  8.3*  PHOS  --   --   --  7.2*  GLUCOSE 138* 134*  --  85    CBG (last 3)  No results for input(s): GLUCAP in the last 72 hours.  Scheduled Meds: . antiseptic oral  rinse  7 mL Mouth Rinse BID  . [START ON 06/28/2014] calcitRIOL  1 mcg Oral Q T,Th,Sa-HD  . citalopram  20 mg Oral QHS  . heparin  5,000 Units Subcutaneous 3 times per day  . lanthanum  2,000 mg Oral TID WC  . metoCLOPramide  10 mg Oral Daily  . multivitamin  1 tablet Oral QHS  . pantoprazole  40 mg Oral Daily  . sodium chloride  3 mL Intravenous Q12H    Continuous Infusions:   Past Medical History  Diagnosis Date  . ESRD on hemodialysis     Started dialysis with PD in 2013 and switched to HD in spring 2014 because of fungal peritonitis.  Takes dialysis at home 5 days a week approx 3h 15 min each session.     . CHF (congestive heart failure)   . Peritoneal dialysis catheter in place   . Anemia     patient reporats that last hgb 7.9 a week ago  . Anxiety state,  unspecified   . Hypertension     Past Surgical History  Procedure Laterality Date  . Appendectomy    . Peritoneal catheter insertion    . Tee without cardioversion N/A 07/11/2013    Procedure: TRANSESOPHAGEAL ECHOCARDIOGRAM (TEE);  Surgeon: Pricilla Riffle, MD;  Location: [facility] ENDOSCOPY;  Service: Cardiovascular;  Laterality: N/A;  . Cardioversion N/A 07/11/2013    Procedure: CARDIOVERSION;  Surgeon: Pricilla Riffle, MD;  Location: [facility] ENDOSCOPY;  Service: Cardiovascular;  Laterality: N/A;  . Knee arthroscopy Right 05/18/2014    Procedure: ARTHROSCOPIC WASHOUT OF RIGHT KNEE;  Surgeon: Sheral Apley, MD;  Location: [facility] OR;  Service: Orthopedics;  Laterality: Right;    Marijean Niemann, MS, RD, LDN Pager # [phone] After hours/ weekend pager # [phone]

## 2014-06-26 NOTE — Progress Notes (Signed)
Family Medicine Teaching Service Daily Progress Note Intern Pager: [phone]  Patient name: Gary Rivera Medical record number: [id number] Date of birth: 1960/09/04 Age: 54 y.o. Gender: male  Primary Care Provider: [name], MD Consultants: Renal Code Status: Full  Pt Overview and Major Events to Date:  4/6 HD 4/7   Assessment and Plan:  Acute on chronic hypercapneic respiratory failure: Respiratory acidosis not requiring NIPPV but with increased oxygen requirement by nasal cannula from baseline of 2-3L at home. Decompensation presumably due to pulmonary edema from missing HD. Chronic respiratory failure due to OHS, ?OSA causing pulmonary HTN. History of prolonged intubation 2013 due to pulmonary edema, though appears very stable at this time and likely to improve greatly following HD, so no need for SDU.  - Oxygen by  prn to keep saturations > 90%.  - HD as below for pulmonary edema  - Consider polysomnography as outpatient  - Consider ABG if worsening status  Chronic combined CHF: Appears hypervolemic with elevated BNP (unsure of utility/reliability in ESRD). 05/19/2014 echo: mild LVH with grade 1 DD, EF 45-50%. Below previous dry weight, though new lower baseline unknown after weight loss related to depression. No catheterizations or chest pain, ECG stable.  - Correct volume status with HD - Strict I/O, daily weight - F/u Hb A1c - Not on beta blocker or ACE inhibitor - consider starting on outpatient follow up  ESRD: Secondary to HTN. Receives home HD 5 days per week (started with PD 2013 but had fungal peritonitis 2014). Access: LUE AVF.  - S/p HD yesterday, will have repeat today; no hyperkalemia, or symptomatic uremia  - Pre HD weight 324 post HD weight 319 - Daily renal function panel - Secondary HPTH/metabolic bone disease: continue calcitriol  Anemia of chronic disease: Previous iron studies show low iron, elevated ferritin and decreased %sat. Stable, no  indication for ESA (Hgb > 10.5) nor tranfusion.  - F/u folate -B12 1081 -Consider iron studies as an outpatient   Anxiety/depression: Has reliable follow up with counselor but no medical management since trying prozac many years ago. This seems to be pt's primary concern at this time related to extreme fatigue.  - Started Citalopram 20 qD.  - Continue home klonopin - Investigation of fatigue: Anemia at baseline, TSH wnl. 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   FEN/GI: Heart healthy diet, saline lock IV Prophylaxis: subcutaneous heparin  Disposition: Pending clinical improvement  Subjective:  Feeling improved with improved mood. Does not or has ever endorsed SI/HI  Objective: Temp:  [98 F (36.7 C)-98.3 F (36.8 C)] 98 F (36.7 C) (04/07 0441) Pulse Rate:  [73-88] 81 (04/07 0441) Resp:  [14-25] 20 (04/07 0441) BP: (103-141)/(55-91) 118/55 mmHg (04/07 0441) SpO2:  [91 %-96 %] 95 % (04/07 0441) Weight:  [319 lb 10.7 oz (145 kg)-330 lb 4.8 oz (149.823 kg)] 319 lb 10.7 oz (145 kg) (04/07 0500) Physical Exam: General: Obese 53yo man laying still in bed in no distress HEENT: Anicteric sclerae, oropharynx clear Cardiovascular: Regular rate, no murmur,Respiratory: Bibasilar crackles L > R without rhonchi or wheezing. Speaking in full sentences comfortably on 3L  Abdomen: Soft, obese and possibly mildly distended, nontender.  Extremities: LUE AVF  Skin: no rashes or wounds Neuro: Alert and oriented, no focal deficits appreciated Psych: Speech is normal volume and rate. Mild flat affect, but overall pleastant Thought process is logical and goal directed. No suicidal or homicidal ideation. Does not appear  to be responding to any internal stimuli.  Laboratory:  Recent Labs Lab 06/25/14 1317 06/25/14 1344 06/25/14 2040  WBC 6.1  --  5.8  HGB 9.9* 12.9* 9.8*  HCT 34.1* 38.0* 33.1*  PLT 180  --  138*    Recent  Labs Lab 06/25/14 1317 06/25/14 1344 06/25/14 2040  NA 145 145  --   K 4.5 4.4  --   CL 101 104  --   CO2 31  --   --   BUN 84* 82*  --   CREATININE 18.83* 17.70* 12.58*  CALCIUM 8.1*  --   --   PROT 7.1  --   --   BILITOT 0.6  --   --   ALKPHOS 47  --   --   ALT 16  --   --   AST 18  --   --   GLUCOSE 138* 134*  --       Imaging/Diagnostic Tests: CXR 4/6  FINDINGS: There is cardiomegaly without edema. The lungs appear clear. No pneumothorax or pleural effusion. The study is limited by the patient's size.  IMPRESSION: Cardiomegaly without acute disease.  Bonney Aid, MD 06/26/2014, 8:28 AM PGY-1, [facility] FPTS Intern pager: [phone], text pages welcome

## 2014-06-26 NOTE — Progress Notes (Addendum)
[facility] Progress Note  Assessment/Plan: 1. SOB / hypoxemia / CXR neg on admission - wears home O2 - seems about baseline - try for more  Volume today - lower edw as able Initial CXR read as normal but looks possibly like early CHF.  Not grossly overloaded on exam and cramped after 2 L off w HD yest. Plan get better CXR 2-view, see if vol contributing to hypoxemia. If not then attribute to OHS or consider PE as well.  2. ESRD on HD- TTS Pt tells me he previously was on incenter HD at [facility] and just started back on Home HD (had been on home HD for about a year prior to knee surgery - he said his home HD Rx is 5 days per week for 3 hr and 19 minutes (based on goal)- THE REALITY is cofirmed by the [facility] chart and discussion with Home Training RN was that IF the pt stayed on his last four treatments for the full time, he could transfer back to Home Hemo - He did not however do this - his last two treatments he ran 4 hr 4 min and 3 hr 50 min of 4.5 hour tmt with subsequent inadequate treatments. So officially he had not been transferred/accepted back into home hemo. 3. Poor kinetics - it is also notable that K went up from yesterday and Cr only decreased from 17 to 15; he had a low AF of .232 05/2014 -HE did only run 3.5 hr on a 2 K bath He had an intervention at [facility] 3/28 but no repeat AFs were done.  Prior to that, Dr. Juel Burrow did an intervention 06/2013- only got 3.5 hours HD Wed and missed Tuesday; needs further HD and reassessment of kinetics to see if he needs a repeat F'gram. Plan repeat BMP pre HD today and repeat again in am - have increased time today to 4 hours which is still < usual time and now much given his size. 4. Recent septic knee - s/p 4wk IV Vanco 5. HTN/volume CXR NAD on admission not on BP medication per home list - net UF 2 L on Wed with post HD weight of 145 (EDW 143) 6. 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 Anxiety/Depression/PTSD -has a therapist at the [facility] - Beth Excell Seltzer  Sheffield Slider, PA-C  [facility] [phone] 06/26/2014,11:25 AM  LOS: 1 day   Pt seen, examined and agree w A/P as above.  [name] MD pager [phone]    cell [phone] 06/26/2014, 1:02 PM    Subjective:   Breathing ok - wears home O2  Objective Filed Vitals:   06/25/14 2201 06/26/14 0441 06/26/14 0500 06/26/14 1000  BP: 137/80 118/55  132/76  Pulse: 81 81  82  Temp: 98 F (36.7 C) 98 F (36.7 C)  97.9 F (36.6 C)  TempSrc: Oral Axillary  Oral  Resp: Height:   (1.88 m)    Weight:  149.823 kg (330 lb 4.8 oz) 145 kg (319 lb 10.7 oz)   SpO2: 96% 95%  96%   Physical Exam General: NAD breathing easily Heart: RRR Lungs: dim BS Abdomen: soft NT Extremities: no sig edema Dialysis Access: left AVF + bruit  Dialysis Orders:TTS North 4.5h  143kg Heparin 36644 L arm AVF 400/800 Mircera 150 next dose due thursday, calcitriol 1.0 ug tiw  Additional Objective Labs: Basic Metabolic Panel:  Recent Labs Lab 06/25/14 1317 06/25/14 1344 06/25/14 2040 06/26/14 0845  NA 145 145  --  142  K 4.5 4.4  --  4.9  CL 101 104  --  102  CO2 31  --   --  25  GLUCOSE 138* 134*  --  85  BUN 84* 82*  --  60*  CREATININE 18.83* 17.70* 12.58* 15.08*  CALCIUM 8.1*  --   --  8.3*  PHOS  --   --   --  7.2*   Liver Function Tests:  Recent Labs Lab 06/25/14 1317 06/26/14 0845  AST 18  --   ALT 16  --   ALKPHOS 47  --   BILITOT 0.6  --   PROT 7.1  --   ALBUMIN 3.6 3.3*  CBC:  Recent Labs Lab 06/25/14 1317 06/25/14 1344 06/25/14 2040  WBC 6.1  --  5.8  NEUTROABS 4.6  --   --   HGB 9.9* 12.9* 9.8*  HCT 34.1* 38.0* 33.1*  MCV 95.0  --  93.5  PLT 180  --  138*   Blood Culture    Component Value Date/Time   SDES BLOOD RIGHT HAND 05/19/2014 0527    SPECREQUEST BOTTLES DRAWN AEROBIC ONLY 3CC 05/19/2014 0527   CULT  05/19/2014 0527    NO GROWTH 5 DAYS Performed at [facility]    REPTSTATUS 05/25/2014 FINAL 05/19/2014 0527   Studies/Results: Dg Chest 2 View  06/25/2014   CLINICAL DATA:  Fatigue, hypoxia and cough for 2-3 days.  EXAM: CHEST  2 VIEW  COMPARISON:  Single view of the chest 05/19/2014. PA and lateral chest 01/12/2014. CT chest 04/29/2013.  FINDINGS: There is cardiomegaly without edema. The lungs appear clear. No pneumothorax or pleural effusion. The study is limited by the patient's size.  IMPRESSION: Cardiomegaly without acute disease.   Electronically Signed   By: Drusilla Kanner M.D.   On: 06/25/2014 15:08   Medications:   . antiseptic oral rinse  7 mL Mouth Rinse BID  . calcitRIOL  0.25 mcg Oral Daily  . citalopram  20 mg Oral QHS  . heparin  5,000 Units Subcutaneous 3 times per day  . lanthanum  2,000 mg Oral TID WC  . metoCLOPramide  10 mg Oral Daily  . multivitamin  1 tablet Oral QHS  . sodium chloride  3 mL Intravenous Q12H

## 2014-06-27 DIAGNOSIS — R0902 Hypoxemia: Secondary | ICD-10-CM

## 2014-06-27 LAB — CBC
HCT: 34.8 % — ABNORMAL LOW (ref 39.0–52.0)
Hemoglobin: 9.7 g/dL — ABNORMAL LOW (ref 13.0–17.0)
MCH: 26.8 pg (ref 26.0–34.0)
MCHC: 27.9 g/dL — ABNORMAL LOW (ref 30.0–36.0)
MCV: 96.1 fL (ref 78.0–100.0)
Platelets: 164 10*3/uL (ref 150–400)
RBC: 3.62 MIL/uL — ABNORMAL LOW (ref 4.22–5.81)
RDW: 16.9 % — ABNORMAL HIGH (ref 11.5–15.5)
WBC: 6.1 10*3/uL (ref 4.0–10.5)

## 2014-06-27 LAB — BASIC METABOLIC PANEL
Anion gap: 10 (ref 5–15)
BUN: 35 mg/dL — ABNORMAL HIGH (ref 6–23)
CO2: 28 mmol/L (ref 19–32)
Calcium: 8.7 mg/dL (ref 8.4–10.5)
Chloride: 102 mmol/L (ref 96–112)
Creatinine, Ser: 10.18 mg/dL — ABNORMAL HIGH (ref 0.50–1.35)
GFR calc Af Amer: 6 mL/min — ABNORMAL LOW (ref >90)
GFR calc non Af Amer: 5 mL/min — ABNORMAL LOW (ref >90)
Glucose, Bld: 86 mg/dL (ref 70–99)
Potassium: 4.3 mmol/L (ref 3.5–5.1)
Sodium: 140 mmol/L (ref 135–145)

## 2014-06-27 LAB — HEMOGLOBIN A1C
HEMOGLOBIN A1C: 5.1 % (ref 4.8–5.6)
Mean Plasma Glucose: 100 mg/dL

## 2014-06-27 LAB — VITAMIN D 25 HYDROXY (VIT D DEFICIENCY, FRACTURES): Vit D, 25-Hydroxy: 13.3 ng/mL — ABNORMAL LOW (ref 30.0–100.0)

## 2014-06-27 MED ORDER — DM-GUAIFENESIN ER 30-600 MG PO TB12
1.0000 | ORAL_TABLET | Freq: Two times a day (BID) | ORAL | Status: DC
  Filled 2014-06-27: qty 1

## 2014-06-27 MED ORDER — CITALOPRAM HYDROBROMIDE 20 MG PO TABS: 20.0000 mg | ORAL_TABLET | Freq: Every day | ORAL | Status: DC

## 2014-06-27 MED ORDER — VITAMIN D (ERGOCALCIFEROL) 1.25 MG (50000 UNIT) PO CAPS
50000.0000 [IU] | ORAL_CAPSULE | ORAL | Status: AC
Start: 2014-06-27 — End: 2014-08-27

## 2014-06-27 MED ORDER — DM-GUAIFENESIN ER 30-600 MG PO TB12: 1.0000 | ORAL_TABLET | Freq: Two times a day (BID) | ORAL | Status: DC

## 2014-06-27 MED ORDER — DARBEPOETIN ALFA 40 MCG/0.4ML IJ SOSY: 40.0000 ug | PREFILLED_SYRINGE | INTRAMUSCULAR | Status: DC

## 2014-06-27 MED ORDER — CALCITRIOL 0.5 MCG PO CAPS: 1.0000 ug | ORAL_CAPSULE | ORAL | Status: DC

## 2014-06-27 NOTE — Progress Notes (Signed)
Family Medicine Teaching Service Daily Progress Note Intern Pager: [phone]  Patient name: Gary Rivera Medical record number: [id number] Date of birth: 1960-11-27 Age: 54 y.o. Gender: male  Primary Care Provider: [name], MD Consultants: Renal Code Status: Full  Pt Overview and Major Events to Date:  4/6 HD 4/7   Assessment and Plan:  Acute on chronic hypercapneic respiratory failure:   - Oxygen by Ada prn to keep saturations > 90%.  - HD as below for pulmonary edema  - Consider polysomnography as outpatient  - Consider ABG if worsening status -CXR 4/7 wnl  Chronic combined CHF: Appears hypervolemic with elevated BNP (unsure of utility/reliability in ESRD). 05/19/2014 echo: mild LVH with grade 1 DD, EF 45-50%. Below previous dry weight, though new lower baseline unknown after weight loss related to depression. No catheterizations or chest pain, ECG stable.  - Correct volume status with HD - Strict I/O, daily weight - F/u Hb A1c - Not on beta blocker or ACE inhibitor - consider starting on outpatient follow up  ESRD: Secondary to HTN. Receives home HD 5 days per week (started with PD 2013 but had fungal peritonitis 2014). Access: LUE AVF.  - S/p HD 4/6 and 4/7,  - Pre HD weight 324 -> 319->314 after second HD - Daily renal function panel - Secondary HPTH/metabolic bone disease: continue calcitriol, fosrenal with meals - HD TTS, consider out[atient HD set up if D/c today  Anemia of chronic disease: Previous iron studies show low iron, elevated ferritin and decreased %sat. Stable, no indication for ESA (Hgb > 10.5) nor tranfusion.  - F/u folate -B12 1081 -[place] today  Anxiety/depression: Has reliable follow up with counselor but no medical management since trying prozac many years ago. This seems to be pt's primary concern at this time related to extreme fatigue.  - Started Citalopram 20 qD.  - Continue home klonopin - Investigation of fatigue: Anemia at  baseline, TSH wnl. 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   FEN/GI: Heart healthy diet, saline lock IV Prophylaxis: subcutaneous heparin  Disposition: Likely today, pending establishment of HD follow up   Subjective:  Feeling improved with improved mood. Continues to not or has ever endorsed SI/HI  Objective: Temp:  [97.9 F (36.6 C)-98.2 F (36.8 C)] 98.2 F (36.8 C) (04/08 0500) Pulse Rate:  [69-86] 86 (04/08 0500) Resp:  [20-22] 20 (04/08 0500) BP: (111-137)/(55-77) 134/74 mmHg (04/08 0500) SpO2:  [95 %-96 %] 95 % (04/08 0500) Weight:  [314 lb 9.5 oz (142.7 kg)-319 lb 7.1 oz (144.9 kg)] 314 lb 9.5 oz (142.7 kg) (04/07 1811) Physical Exam: General: Obese 53yo man laying still in bed in no distress HEENT: Anicteric sclerae, oropharynx clear Cardiovascular: Regular rate, no murmur,Respiratory: Bibasilar crackles L > R without rhonchi or wheezing. Speaking in full sentences comfortably on 3L  Abdomen: Soft, obese and possibly mildly distended, nontender.  Extremities: LUE AVF  Skin: no rashes or wounds Neuro: Alert and oriented, no focal deficits appreciated Psych: Speech is normal volume and rate. Mild flat affect, but overall pleastant Thought process is logical and goal directed. No suicidal or homicidal ideation. Does not appear to be responding to any internal stimuli.  Laboratory:  Recent Labs Lab 06/25/14 1317 06/25/14 1344 06/25/14 2040 06/27/14 0438  WBC 6.1  --  5.8 6.1  HGB 9.9* 12.9* 9.8* 9.7*  HCT 34.1* 38.0* 33.1* 34.8*  PLT 180  --  138* 164  Recent Labs Lab 06/25/14 1317  06/26/14 0845 06/26/14 1945 06/27/14 0438  NA 145  < > 142 139 140  K 4.5  < > 4.9 4.6 4.3  CL 101  < > 102 100 102  CO2 31  --  25 28 28   BUN 84*  < > 60* 28* 35*  CREATININE 18.83*  < > 15.08* 8.37* 10.18*  CALCIUM 8.1*  --  8.3* 8.4 8.7  PROT 7.1  --   --   --   --   BILITOT 0.6  --   --   --   --   ALKPHOS 47   --   --   --   --   ALT 16  --   --   --   --   AST 18  --   --   --   --   GLUCOSE 138*  < > 85 123* 86  < > = values in this interval not displayed.    Imaging/Diagnostic Tests: CXR 4/7  FINDINGS: Shallow inspiration with elevation of right hemidiaphragm. Cardiac enlargement without significant vascular congestion or edema. No focal consolidation. No blunting of costophrenic angles. No pneumothorax. No significant change since prior study.  IMPRESSION: Cardiac enlargement. No evidence of consolidation or edema. Bonney Aid, MD 06/27/2014, 9:05 AM PGY-1, [facility] FPTS Intern pager: [phone], text pages welcome

## 2014-06-27 NOTE — Discharge Summary (Signed)
Family Medicine Teaching [facility] Discharge Summary  Patient name: Gary Rivera Medical record number: [id number] Date of birth: 01/05/1961 Age: 54 y.o. Gender: male Date of Admission: 06/25/2014  Date of Discharge: 06/27/2014 Admitting Physician: Tobey Grim, MD  Primary Care Provider: [name], MD Consultants: Nephrology  Indication for Hospitalization:  Anxiety/Depression ESRD  Discharge Diagnoses/Problem List:  Patient Active Problem List   Diagnosis Date Noted  . Acute pulmonary edema   . ESRD (end stage renal disease) on dialysis   . Hypoxia   . Acute on chronic respiratory failure 06/25/2014  . ESRD on hemodialysis 05/19/2014  . Normocytic anemia 05/19/2014  . Septic arthritis 05/18/2014  . Cholelithiasis 07/22/2013  . 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 Pulmonary hypertension 07/13/2013  . End stage renal disease-  on HD 07/11/2013  . Hypotension 07/11/2013  . NICM- recent EF "Nl" per pt- followed at [facility] 07/11/2013  . Atrial flutter with RVR 07/10/2013  . Nausea & vomiting 02/24/2013  . Peritonitis due to infected peritoneal dialysis catheter 08/03/2012  . Peritoneal dialysis catheter exit site infection 07/07/2012  . Septic shock(785.52) 07/02/2012  . Cough 12/30/2011  . Delirium, acute 09/20/2011  . Hypotension of hemodialysis secondary to volume depletion 09/19/2011  . 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 Pulmonary HTN- 50 mmHg 2011 ECHO 09/08/2011  . Mitral regurgitation (MODERATE) 09/08/2011  . LVH (left ventricular hypertrophy) MODERATE 09/08/2011  . Obesity (BMI 30-39.9) 09/08/2011  . Elevated CPK: myositis vs rhabdomyolosis 09/08/2011  . Thrombocytopenia 09/08/2011  . Acute respiratory failure with hypercapnia 09/08/2011  . Hypercapnia 09/08/2011  . Pulmonary edema 09/08/2011  . Obesity hypoventilation syndrome 09/08/2011  . Acute respiratory failure with hypoxia 09/07/2011  . Chronic systolic congestive heart failure, NYHA class  1 09/07/2011  . HTN (hypertension) 09/07/2011  . Anemia 09/07/2011     Disposition: To home  Discharge Condition: Stable  Discharge Exam:   Temp: [98.5 F (36.9 C)-98.7 F (37.1 C)] 98.7 F (37.1 C) (04/08 0606) Pulse Rate: [89-92] 89 (04/08 0606) Resp: [16-18] 18 (04/08 0606) BP: (125-152)/(53-68) 134/60 mmHg (04/08 0606) SpO2: [95 %-97 %] 97 % (04/08 0606)  Physical Exam General: Obese, laying in bed, NAD HEENT: Anicteric, sclerae, oropharynx clear CV: RRR,  Pulm: bilateral basal crackles, else CTAB Abd: Soft, nontender obese Extremities: LUE AVF Skin: NO rashes or lesion Neuro: AOx4,no focal deficits Psych: Speech normal in volume and rate. Mild flat affect but overall pleasant. Thought process logical and directed. No homicidal or suicidal ideation. No apparent response to internal stimuli   Brief Hospital Course:   Gary Rivera is a 54 y.o. Male who presented with dyspnea and pulmonary edema after missing HD as well as fatigue and depression. PMH is significant for ESRD on home HD, HTN, h/o PAF s/p DCCV 2014, anemia requiring tranfusions, and recently completed treatment for septic arthritis.   Acute on chronic hypercapneic respiratory failure:  - He remained stable with Oxygen that was able to be weaned to his home oxygen levels. He had a chest xray on 4/6 and 4/7 which were negative for pulmonary process.    Chronic combined CHF: Since he missed HD  prior to presenting to appeared to be hypervolemic on presentation which improved with HD inpatient.   ESRD:  He received hemodialysis twice while inpatient with appropriate 10 lb weight loss. He was received with scheduled darbopoeitin as well as p  Anemia of chronic disease: Remained stable at baseline.He received Darbepoetin while inpatient   Anxiety/depression:  Has  reliable follow up with counselor but no medical management since trying prozac many years ago. He was started on Citalopram 20 qD.  with close follow up with his therapist to initiate group therapy per his request. His therapist is Counsellor at [employer].   FEN/GI:  Remained stale abd managed in a Heart healthy diet  Prophylaxis: He was maintained on subcutaneous heparin for DVT prophylaxis  Issues for Follow Up:  1. He is currently not on beta blocker or ACE inhibitor - consider starting on outpatient follow up 2. Ensure continues to obtain HD on schedule 3. Consider Polysomnography for concern of OSA  Significant Procedures: Hemodialysis x2 while inpatient  Significant Labs and Imaging:   Recent Labs Lab 06/25/14 1317 06/25/14 1344 06/25/14 2040 06/27/14 0438  WBC 6.1  --  5.8 6.1  HGB 9.9* 12.9* 9.8* 9.7*  HCT 34.1* 38.0* 33.1* 34.8*  PLT 180  --  138* 164    Recent Labs Lab 06/25/14 1317 06/25/14 1344 06/25/14 2040 06/26/14 0845 06/26/14 1945 06/27/14 0438  NA 145 145  --  142 139 140  K 4.5 4.4  --  4.9 4.6 4.3  CL 101 104  --  102 100 102  CO2 31  --   --  GLUCOSE 138* 134*  --  85 123* 86  BUN 84* 82*  --  60* 28* 35*  CREATININE 18.83* 17.70* 12.58* 15.08* 8.37* 10.18*  CALCIUM 8.1*  --   --  8.3* 8.4 8.7  PHOS  --   --   --  7.2* 4.2  --   ALKPHOS 47  --   --   --   --   --   AST 18  --   --   --   --   --   ALT 16  --   --   --   --   --   ALBUMIN 3.6  --   --  3.3* 3.2*  --      Results/Tests Pending at Time of Discharge: Folate  Discharge Medications:    Medication List    ASK your doctor about these medications        albuterol 108 (90 BASE) MCG/ACT inhaler  Commonly known as:  PROVENTIL HFA;VENTOLIN HFA  Inhale 1 puff into the lungs every 6 (six) hours as needed for wheezing or shortness of breath.     calcitRIOL 0.25 MCG capsule  Commonly known as:  ROCALTROL  Take 0.25 mcg by mouth daily.     clonazePAM 0.5 MG tablet  Commonly known as:  KLONOPIN  Take 1 mg by mouth 2 (two) times daily as needed for anxiety.     lanthanum 1000 MG chewable  tablet  Commonly known as:  FOSRENOL  Chew 1,000-2,000 mg by mouth 3 (three) times daily with meals. Takes 2 tablets with meals and 1 tablet with snacks.     metoCLOPramide 10 MG tablet  Commonly known as:  REGLAN  Take 10 mg by mouth daily.     multivitamin Tabs tablet  Take 1 tablet by mouth at bedtime.     Omega-3 1000 MG Caps  Take 1 g by mouth 2 (two) times daily.     omeprazole 10 MG capsule  Commonly known as:  PRILOSEC  Take 10 mg by mouth daily.     Oxycodone HCl 10 MG Tabs  Take 1 tablet (10 mg total) by mouth 3 (three) times daily as needed.  ursodiol 300 MG capsule  Commonly known as:  ACTIGALL  Take 1 capsule (300 mg total) by mouth 3 (three) times daily.     ursodiol 300 MG capsule  Commonly known as:  ACTIGALL  Take 300 mg by mouth daily.     vancomycin 1 GM/200ML Soln  Commonly known as:  VANCOCIN  Inject 200 mLs (1,000 mg total) into the vein every Monday, Wednesday, and Friday with hemodialysis.     vitamin E 400 UNIT capsule  Take 400 Units by mouth daily.        Discharge Instructions: Please refer to Patient Instructions section of EMR for full details.  Patient was counseled important signs and symptoms that should prompt return to medical care, changes in medications, dietary instructions, activity restrictions, and follow up appointments.   Follow-Up Appointments: 4/12 with primary care  Bonney Aid, MD 06/27/2014, 12:34 PM PGY-1, [facility]

## 2014-06-27 NOTE — Progress Notes (Signed)
Subjective:  No current complaints, no dyspnea  Objective: Vital signs in last 24 hours: Temp:  [97.9 F (36.6 C)-98.2 F (36.8 C)] 98.2 F (36.8 C) (04/08 0500) Pulse Rate:  [69-86] 86 (04/08 0500) Resp:  [20-22] 20 (04/08 0500) BP: (111-137)/(55-77) 134/74 mmHg (04/08 0500) SpO2:  [95 %-96 %] 95 % (04/08 0500) Weight:  [142.7 kg (314 lb 9.5 oz)-144.9 kg (319 lb 7.1 oz)] 142.7 kg (314 lb 9.5 oz) (04/07 1811) Weight change: -2.1 kg (-4 lb 10.1 oz)  Intake/Output from previous day: 04/07 0701 - 04/08 0700 In: 720 [P.O.:720] Out: 1500  Intake/Output this shift:   Lab Results:  Recent Labs  06/25/14 2040 06/27/14 0438  WBC 5.8 6.1  HGB 9.8* 9.7*  HCT 33.1* 34.8*  PLT 138* 164   BMET:  Recent Labs  06/26/14 0845 06/26/14 1945 06/27/14 0438  NA 142 139 140  K 4.9 4.6 4.3  CL 102 100 102  CO2 25 28 28   GLUCOSE 85 123* 86  BUN 60* 28* 35*  CREATININE 15.08* 8.37* 10.18*  CALCIUM 8.3* 8.4 8.7  ALBUMIN 3.3* 3.2*  --    No results for input(s): PTH in the last 72 hours. Iron Studies: No results for input(s): IRON, TIBC, TRANSFERRIN, FERRITIN in the last 72 hours.  Studies/Results: Dg Chest 2 View  06/27/2014   CLINICAL DATA:  Shortness of breath. Hypoxemia. End-stage renal disease.  EXAM: CHEST  2 VIEW  COMPARISON:  06/25/2014  FINDINGS: Shallow inspiration with elevation of right hemidiaphragm. Cardiac enlargement without significant vascular congestion or edema. No focal consolidation. No blunting of costophrenic angles. No pneumothorax. No significant change since prior study.  IMPRESSION: Cardiac enlargement.  No evidence of consolidation or edema.   Electronically Signed   By: Burman Nieves M.D.   On: 06/27/2014 01:12    EXAM: General appearance:  Alert, in no apparent distress Resp:  CTA without rales, rhonchi, or wheezes Cardio:  RRR without murmur or rub GI:  + BS, soft and nontender Extremities:   No edema Access:  AVG @ LUA with + bruit  Dialysis  Orders:[facility] 4.5h 143kg Heparin 16109 L arm AVF 400/800 Mircera 150 next dose due thursday, calcitriol 1.0 ug tiw  Assessment/Plan: 1. Dyspnea / Hypoxemia - uses home O2, s/p HD (net UF 4 L on 4/6, 1.5 L on 4/7), CXR yesterday clear. 2. ESRD - HD on TTS @ [facility], K 4.3; previously home HD, but has not been transferred or accepted back to home HD.  Next HD tomorrow. 3. Poor kinetics - BUN/Cr, K improving in hospital while receiving adequate HD; access intervention @ [facility] 3/28, but no follow-up AF. 4. Recent septic knee - s/p Vancomycin x 4 wks. 5. HTN/Volume - BP 134/74, no meds; wt 142.7 kg, @ [facility] with negative CXR. 6. Anemia - Hgb 9.7, Mircera was due yesterday, give Aranesp. 7. Sec HPT - Ca 8.7 (9.3 corrected), P 4.2; Calcitriol 1 mcg, Fosrenol 2 g with meals. 8. Nutrition - Alb 3.2, renal diet, vitamin. 9. Anxiety / Depression / PTSD - sees therapist at [facility]).   LOS: 2 days   Gary Rivera,Gary Rivera 06/27/2014,7:59 AM   Pt seen, examined and agree w A/P as above. The patient never really was convincingly volume overloaded. CXR's have shown crowding of vessels, small lung volumes consistent w OHS diagnosis. He is at his dry wt w no peripheral edema. Can't say that vol excess was admitting issue.  He says it is depression.  25  vit D low, would supplement, will take care of dosing this. [name] MD pager [phone]    cell [phone] 06/27/2014, 11:17 AM

## 2014-06-27 NOTE — Progress Notes (Signed)
Pt up to chair.

## 2014-06-27 NOTE — Progress Notes (Signed)
Pt discharge instructions given, pt verbalized understanding.  VSS. Denies pain. Pt waiting on family for transportation home. 

## 2014-06-27 NOTE — Progress Notes (Signed)
Decreased O2 to 1.5 lpm via [place]

## 2014-06-27 NOTE — Care Management Note (Signed)
CARE MANAGEMENT NOTE 06/27/2014  Patient:  Gary Rivera, Gary Rivera   Account Number:  [id number]  Date Initiated:  06/27/2014  Documentation initiated by:  Janziel Hockett  Subjective/Objective Assessment:   CM following for progression and d/c planning.     Action/Plan:   Met with pt and family, IM given and pt requesting manual wheelchair, request sent to pt MD. Will asssist if ordered.   Anticipated DC Date:  06/29/2014   Anticipated DC Plan:  HOME/SELF CARE         Choice offered to / List presented to:             Status of service:  In process, will continue to follow Medicare Important Message given?  YES (If response is "NO", the following Medicare IM given date fields will be blank) Date Medicare IM given:  06/27/2014 Medicare IM given by:  Takina Busser Date Additional Medicare IM given:   Additional Medicare IM given by:    Discharge Disposition:    Per UR Regulation:    If discussed at Long Length of Stay Meetings, dates discussed:    Comments:

## 2014-06-28 ENCOUNTER — Other Ambulatory Visit: Payer: Self-pay | Admitting: Student

## 2014-07-01 ENCOUNTER — Inpatient Hospital Stay: Payer: Medicare Other | Admitting: Internal Medicine

## 2014-07-03 LAB — FOLATE RBC
FOLATE, HEMOLYSATE: 607.1 ng/mL
Folate, RBC: UNDETERMINED ng/mL

## 2015-02-01 ENCOUNTER — Emergency Department (HOSPITAL_COMMUNITY): Payer: Medicare Other

## 2015-02-01 ENCOUNTER — Inpatient Hospital Stay (HOSPITAL_COMMUNITY)
Admission: EM | Admit: 2015-02-01 | Discharge: 2015-02-05 | DRG: 314 | Disposition: A | Discharge status: Home / Self Care | Payer: Medicare Other | Attending: Internal Medicine | Admitting: Internal Medicine

## 2015-02-01 ENCOUNTER — Encounter (HOSPITAL_COMMUNITY): Payer: Self-pay

## 2015-02-01 DIAGNOSIS — Z992 Dependence on renal dialysis: Secondary | ICD-10-CM

## 2015-02-01 DIAGNOSIS — I5022 Chronic systolic (congestive) heart failure: Secondary | ICD-10-CM | POA: Diagnosis present

## 2015-02-01 DIAGNOSIS — E1122 Type 2 diabetes mellitus with diabetic chronic kidney disease: Secondary | ICD-10-CM | POA: Diagnosis present

## 2015-02-01 DIAGNOSIS — I132 Hypertensive heart and chronic kidney disease with heart failure and with stage 5 chronic kidney disease, or end stage renal disease: Secondary | ICD-10-CM | POA: Diagnosis present

## 2015-02-01 DIAGNOSIS — I10 Essential (primary) hypertension: Secondary | ICD-10-CM | POA: Diagnosis present

## 2015-02-01 DIAGNOSIS — T82590A Other mechanical complication of surgically created arteriovenous fistula, initial encounter: Principal | ICD-10-CM | POA: Diagnosis present

## 2015-02-01 DIAGNOSIS — J9602 Acute respiratory failure with hypercapnia: Secondary | ICD-10-CM | POA: Diagnosis present

## 2015-02-01 DIAGNOSIS — J810 Acute pulmonary edema: Secondary | ICD-10-CM | POA: Diagnosis present

## 2015-02-01 DIAGNOSIS — J9601 Acute respiratory failure with hypoxia: Secondary | ICD-10-CM | POA: Diagnosis present

## 2015-02-01 DIAGNOSIS — E669 Obesity, unspecified: Secondary | ICD-10-CM | POA: Diagnosis present

## 2015-02-01 DIAGNOSIS — Z6841 Body Mass Index (BMI) 40.0 and over, adult: Secondary | ICD-10-CM

## 2015-02-01 DIAGNOSIS — E8770 Fluid overload, unspecified: Secondary | ICD-10-CM | POA: Diagnosis present

## 2015-02-01 DIAGNOSIS — D649 Anemia, unspecified: Secondary | ICD-10-CM | POA: Diagnosis present

## 2015-02-01 DIAGNOSIS — F411 Generalized anxiety disorder: Secondary | ICD-10-CM | POA: Diagnosis present

## 2015-02-01 DIAGNOSIS — I428 Other cardiomyopathies: Secondary | ICD-10-CM | POA: Diagnosis present

## 2015-02-01 DIAGNOSIS — R0602 Shortness of breath: Secondary | ICD-10-CM | POA: Diagnosis not present

## 2015-02-01 DIAGNOSIS — Z9115 Patient's noncompliance with renal dialysis: Secondary | ICD-10-CM

## 2015-02-01 DIAGNOSIS — Z888 Allergy status to other drugs, medicaments and biological substances status: Secondary | ICD-10-CM

## 2015-02-01 DIAGNOSIS — M898X9 Other specified disorders of bone, unspecified site: Secondary | ICD-10-CM | POA: Diagnosis present

## 2015-02-01 DIAGNOSIS — D638 Anemia in other chronic diseases classified elsewhere: Secondary | ICD-10-CM | POA: Diagnosis present

## 2015-02-01 DIAGNOSIS — Z9981 Dependence on supplemental oxygen: Secondary | ICD-10-CM

## 2015-02-01 DIAGNOSIS — T8241XA Breakdown (mechanical) of vascular dialysis catheter, initial encounter: Secondary | ICD-10-CM

## 2015-02-01 DIAGNOSIS — D631 Anemia in chronic kidney disease: Secondary | ICD-10-CM | POA: Diagnosis present

## 2015-02-01 DIAGNOSIS — G9340 Encephalopathy, unspecified: Secondary | ICD-10-CM | POA: Diagnosis present

## 2015-02-01 DIAGNOSIS — E872 Acidosis: Secondary | ICD-10-CM | POA: Diagnosis present

## 2015-02-01 DIAGNOSIS — Z79899 Other long term (current) drug therapy: Secondary | ICD-10-CM

## 2015-02-01 DIAGNOSIS — E875 Hyperkalemia: Secondary | ICD-10-CM | POA: Diagnosis present

## 2015-02-01 DIAGNOSIS — Z978 Presence of other specified devices: Secondary | ICD-10-CM

## 2015-02-01 DIAGNOSIS — N186 End stage renal disease: Secondary | ICD-10-CM

## 2015-02-01 DIAGNOSIS — G4733 Obstructive sleep apnea (adult) (pediatric): Secondary | ICD-10-CM | POA: Diagnosis present

## 2015-02-01 MED ORDER — METHYLPREDNISOLONE SODIUM SUCC 125 MG IJ SOLR
125.0000 mg | Freq: Once | INTRAMUSCULAR | Status: AC
  Administered 2015-02-01: 125 mg via INTRAMUSCULAR
  Filled 2015-02-01: qty 2

## 2015-02-01 MED ORDER — ALBUTEROL SULFATE (2.5 MG/3ML) 0.083% IN NEBU
5.0000 mg | INHALATION_SOLUTION | Freq: Once | RESPIRATORY_TRACT | Status: AC
  Administered 2015-02-01: 5 mg via RESPIRATORY_TRACT
  Filled 2015-02-01: qty 6

## 2015-02-01 NOTE — ED Notes (Signed)
Per EMS: Pt on home dialysis. Having trouble accessing graft. Has not had dialysis since Friday. Also, has yearly bouts of bronchitis. Pt states 3 days ago began feeling a flair up. Obvious rhonchi on arrival. [facility] RN told pt to call EMS this evening. BP 140/90. CBG = 170

## 2015-02-01 NOTE — ED Provider Notes (Signed)
CSN: [id number]     Arrival date & time 02/01/15  2300 History   By signing my name below, I, Gary Rivera, attest that this documentation has been prepared under the direction and in the presence of Gary Racer, MD.  Electronically Signed: [name], ED Scribe. 02/01/2015. 11:49 PM.   Chief Complaint  Patient presents with  . Vascular Access Problem  . Bronchitis     The history is provided by the patient. No language interpreter was used.    HPI Comments: Gary Rivera brought in by EMS is a 54 y.o. male with a PMHx of ESRD, CHF, and HTN who presents to the Emergency Department complaining of constant, ongoing, worsening shortness of breath x 2 days. Ongoing cough also reported. No aggravating or alleviating factors at this time. No OTC medications, home remedies, or treatments attempted prior to arrival. No recent fever, chills, nausea, or vomiting. Gary Rivera typically uses 2 liters of oxygen at home. Pt is currently on home dialysis and reports some trouble with accessing his graft. Last successful dialysis treatment 2 days ago.  PCP: Gary Livings, MD    Past Medical History  Diagnosis Date  . ESRD on hemodialysis (HCC)     Started dialysis with PD in 2013 and switched to HD in spring 2014 because of fungal peritonitis.  Takes dialysis at home 5 days a week approx 3h 15 min each session.     . CHF (congestive heart failure) (HCC)   . Peritoneal dialysis catheter in place [facility])   . Anemia     patient reporats that last hgb 7.9 a week ago  . Anxiety state, unspecified   . Hypertension    Past Surgical History  Procedure Laterality Date  . Appendectomy    . Peritoneal catheter insertion    . Tee without cardioversion N/A 07/11/2013    Procedure: TRANSESOPHAGEAL ECHOCARDIOGRAM (TEE);  Surgeon: Gary Riffle, MD;  Location: [facility] ENDOSCOPY;  Service: Cardiovascular;  Laterality: N/A;  . Cardioversion N/A 07/11/2013    Procedure: CARDIOVERSION;  Surgeon: Gary Riffle, MD;   Location: [name] ENDOSCOPY;  Service: Cardiovascular;  Laterality: N/A;  . Knee arthroscopy Right 05/18/2014    Procedure: ARTHROSCOPIC WASHOUT OF RIGHT KNEE;  Surgeon: Gary Apley, MD;  Location[facility] OR;  Service: Orthopedics;  Laterality: Right;   Family History  Problem Relation Age of Onset  . Diabetes Mother   . Hypertension Mother   . Hypertension Sister   . Asthma Sister    Social History  Substance Use Topics  . Smoking status: Never Smoker   . Smokeless tobacco: Never Used  . Alcohol Use: No    Review of Systems  Constitutional: Negative for fever and chills.  Respiratory: Positive for cough and shortness of breath. Negative for wheezing.   Cardiovascular: Positive for leg swelling. Negative for chest pain and palpitations.  Gastrointestinal: Negative for nausea, vomiting and abdominal pain.  Musculoskeletal: Negative for back pain, neck pain and neck stiffness.  Skin: Negative for rash and wound.  Neurological: Negative for dizziness, weakness, light-headedness, numbness and headaches.  Psychiatric/Behavioral: Negative for confusion.  All other systems reviewed and are negative.     Allergies  Renvela  Home Medications   Prior to Admission medications   Medication Sig Start Date End Date Taking? Authorizing Provider  albuterol (PROVENTIL HFA;VENTOLIN HFA) 108 (90 BASE) MCG/ACT inhaler Inhale 1 puff into the lungs every 6 (six) hours as needed for wheezing or shortness of breath.   Yes Historical  Provider, MD  amitriptyline (ELAVIL) 25 MG tablet Take 25 mg by mouth at bedtime.   Yes Historical Provider, MD  clonazePAM (KLONOPIN) 0.5 MG tablet Take 1 mg by mouth 2 (two) times daily as needed for anxiety.    Yes Historical Provider, MD  metoCLOPramide (REGLAN) 10 MG tablet Take 10 mg by mouth daily. 05/03/14  Yes Historical Provider, MD  multivitamin (RENA-VIT) TABS tablet Take 1 tablet by mouth at bedtime. 05/21/14  Yes Gary U Vann, DO  Omega-3 1000 MG CAPS Take 1 g  by mouth 2 (two) times daily.   Yes Historical Provider, MD  omeprazole (PRILOSEC) 10 MG capsule Take 10 mg by mouth daily.   Yes Historical Provider, MD  Oxycodone HCl 10 MG TABS Take 1 tablet (10 mg total) by mouth 3 (three) times daily as needed. Patient taking differently: Take 10 mg by mouth 3 (three) times daily as needed (pain).  05/21/14  Yes Gary Art, DO  sevelamer carbonate (RENVELA) 800 MG tablet Take 1,600 mg by mouth 3 (three) times daily with meals.   Yes Historical Provider, MD  vitamin E 400 UNIT capsule Take 400 Units by mouth daily.   Yes Historical Provider, MD   Triage Vitals: BP 120/93 mmHg  Pulse 85  Resp 27  SpO2 92%   Physical Exam  Constitutional: He is oriented to person, place, and time. He appears well-developed and well-nourished. No distress.  HENT:  Head: Normocephalic and atraumatic.  Mouth/Throat: Oropharynx is clear and moist.  Eyes: EOM are normal. Pupils are equal, round, and reactive to light.  Neck: Normal range of motion. Neck supple.  Cardiovascular: Normal rate and regular rhythm.   Pulmonary/Chest: No respiratory distress. He has no wheezes. He has no rales. He exhibits no tenderness.  Increased respiratory effort. Rhonchi bilaterally.  Abdominal: Soft. Bowel sounds are normal. He exhibits no distension and no mass. There is no tenderness. There is no rebound and no guarding.  Musculoskeletal: Normal range of motion. He exhibits edema. He exhibits no tenderness.  2+ bilateral lower extremity edema. Palpable thrill to graft on left forearm. No erythema or warmth.  Neurological: He is alert and oriented to person, place, and time.  Moving all extremities without deficit. Sensation is fully intact.  Skin: Skin is warm and dry. No rash noted. No erythema.  Psychiatric: He has a normal mood and affect. His behavior is normal.  Nursing note and vitals reviewed.   ED Course  Procedures (including critical care time)  DIAGNOSTIC  STUDIES: Oxygen Saturation is 91% on [place], low by my interpretation.    COORDINATION OF CARE: 11:20 PM- Will give breathing treatment. Will order CXR, CBC, EKG, CMP, and BNP. Discussed treatment plan with pt at bedside and pt agreed to plan.     Labs Review Labs Reviewed  CBC WITH DIFFERENTIAL/PLATELET - Abnormal; Notable for the following:    RBC 3.81 (*)    Hemoglobin 10.6 (*)    HCT 36.3 (*)    MCHC 29.2 (*)    RDW 16.5 (*)    All other components within normal limits  COMPREHENSIVE METABOLIC PANEL - Abnormal; Notable for the following:    Potassium 7.1 (*)    CO2 19 (*)    Glucose, Bld 118 (*)    BUN 164 (*)    Creatinine, Ser 27.07 (*)    Calcium 6.6 (*)    ALT 12 (*)    GFR calc non Af Amer 2 (*)    GFR  calc Af Amer 2 (*)    Anion gap 22 (*)    All other components within normal limits  BRAIN NATRIURETIC PEPTIDE - Abnormal; Notable for the following:    B Natriuretic Peptide 485.4 (*)    All other components within normal limits  POTASSIUM    Imaging Review Dg Chest Port 1 View  02/02/2015  CLINICAL DATA:  Patient on hormone dialysis. Acute onset of shortness of breath, with rhonchi. Initial encounter. EXAM: PORTABLE CHEST 1 VIEW COMPARISON:  Chest radiograph performed 06/26/2014 FINDINGS: The lungs are hypoexpanded. Vascular congestion is noted, with increased interstitial markings, concerning for mild pulmonary edema. A small left pleural effusion is noted. No pneumothorax is seen. The cardiomediastinal silhouette is borderline normal in size. No acute osseous abnormalities are seen. IMPRESSION: Lungs hypoexpanded. Vascular congestion, with increased interstitial markings, concerning for mild pulmonary edema. Small left pleural effusion noted. Electronically Signed   By: Roanna Raider M.D.   On: 02/02/2015 00:05   I have personally reviewed and evaluated these images and lab results as part of my medical decision-making.   EKG Interpretation   Date/Time:  Sunday  February 01 2015 23:58:34 EST Ventricular Rate:  83 PR Interval:  158 QRS Duration: 104 QT Interval:  420 QTC Calculation: 493 R Axis:   -8 Text Interpretation:  Sinus rhythm Borderline prolonged QT interval  Confirmed by Ranae Palms  MD, Alexios Keown ([id number]) on 02/02/2015 1:36:48 AM     CRITICAL CARE Performed by: Ranae Palms, Madelyn Tlatelpa Total critical care time: 35 minutes Critical care time was exclusive of separately billable procedures and treating other patients. Critical care was necessary to treat or prevent imminent or life-threatening deterioration. Critical care was time spent personally by me on the following activities: development of treatment plan with patient and/or surrogate as well as nursing, discussions with consultants, evaluation of patient's response to treatment, examination of patient, obtaining history from patient or surrogate, ordering and performing treatments and interventions, ordering and review of laboratory studies, ordering and review of radiographic studies, pulse oximetry and re-evaluation of patient's condition.  MDM   Final diagnoses:  Acute pulmonary edema (HCC)  Hyperkalemia    I personally performed the services described in this documentation, which was scribed in my presence. The recorded information has been reviewed and is accurate.    Patient with evidence of fluid overload on chest x-ray. Increasing need for nasal cannula oxygen to keep saturations above 90%. We'll place on BiPAP. Discussed with Dr. Lowell Guitar at [phone]. Advises giving 45 G of Kayexalate and will dialyze in the morning.  Patient is much more comfortable on BiPAP. No respiratory distress. Discussed with Dr. Katrinka Blazing, [facility] hospitalist. Will admit to step down bed pending dialysis.  Gary Racer, MD 02/02/15 [phone]

## 2015-02-02 ENCOUNTER — Inpatient Hospital Stay (HOSPITAL_COMMUNITY): Payer: Medicare Other

## 2015-02-02 DIAGNOSIS — T82590A Other mechanical complication of surgically created arteriovenous fistula, initial encounter: Secondary | ICD-10-CM | POA: Diagnosis present

## 2015-02-02 DIAGNOSIS — Z888 Allergy status to other drugs, medicaments and biological substances status: Secondary | ICD-10-CM | POA: Diagnosis not present

## 2015-02-02 DIAGNOSIS — Z9115 Patient's noncompliance with renal dialysis: Secondary | ICD-10-CM | POA: Diagnosis not present

## 2015-02-02 DIAGNOSIS — Z992 Dependence on renal dialysis: Secondary | ICD-10-CM | POA: Diagnosis not present

## 2015-02-02 DIAGNOSIS — D649 Anemia, unspecified: Secondary | ICD-10-CM | POA: Diagnosis not present

## 2015-02-02 DIAGNOSIS — G9340 Encephalopathy, unspecified: Secondary | ICD-10-CM | POA: Diagnosis present

## 2015-02-02 DIAGNOSIS — G4733 Obstructive sleep apnea (adult) (pediatric): Secondary | ICD-10-CM | POA: Diagnosis present

## 2015-02-02 DIAGNOSIS — E8770 Fluid overload, unspecified: Secondary | ICD-10-CM | POA: Diagnosis not present

## 2015-02-02 DIAGNOSIS — J9602 Acute respiratory failure with hypercapnia: Secondary | ICD-10-CM | POA: Diagnosis not present

## 2015-02-02 DIAGNOSIS — I132 Hypertensive heart and chronic kidney disease with heart failure and with stage 5 chronic kidney disease, or end stage renal disease: Secondary | ICD-10-CM | POA: Diagnosis present

## 2015-02-02 DIAGNOSIS — E875 Hyperkalemia: Secondary | ICD-10-CM

## 2015-02-02 DIAGNOSIS — J9601 Acute respiratory failure with hypoxia: Secondary | ICD-10-CM | POA: Diagnosis not present

## 2015-02-02 DIAGNOSIS — I10 Essential (primary) hypertension: Secondary | ICD-10-CM | POA: Diagnosis not present

## 2015-02-02 DIAGNOSIS — Z79899 Other long term (current) drug therapy: Secondary | ICD-10-CM | POA: Diagnosis not present

## 2015-02-02 DIAGNOSIS — I428 Other cardiomyopathies: Secondary | ICD-10-CM | POA: Diagnosis present

## 2015-02-02 DIAGNOSIS — I5022 Chronic systolic (congestive) heart failure: Secondary | ICD-10-CM | POA: Diagnosis present

## 2015-02-02 DIAGNOSIS — J810 Acute pulmonary edema: Secondary | ICD-10-CM | POA: Diagnosis present

## 2015-02-02 DIAGNOSIS — M898X9 Other specified disorders of bone, unspecified site: Secondary | ICD-10-CM | POA: Diagnosis present

## 2015-02-02 DIAGNOSIS — D638 Anemia in other chronic diseases classified elsewhere: Secondary | ICD-10-CM | POA: Diagnosis present

## 2015-02-02 DIAGNOSIS — N186 End stage renal disease: Secondary | ICD-10-CM | POA: Diagnosis present

## 2015-02-02 DIAGNOSIS — F411 Generalized anxiety disorder: Secondary | ICD-10-CM | POA: Diagnosis present

## 2015-02-02 DIAGNOSIS — Z6841 Body Mass Index (BMI) 40.0 and over, adult: Secondary | ICD-10-CM | POA: Diagnosis not present

## 2015-02-02 DIAGNOSIS — D631 Anemia in chronic kidney disease: Secondary | ICD-10-CM | POA: Diagnosis present

## 2015-02-02 DIAGNOSIS — R0602 Shortness of breath: Secondary | ICD-10-CM | POA: Diagnosis present

## 2015-02-02 DIAGNOSIS — Z9981 Dependence on supplemental oxygen: Secondary | ICD-10-CM | POA: Diagnosis not present

## 2015-02-02 DIAGNOSIS — E872 Acidosis: Secondary | ICD-10-CM | POA: Diagnosis present

## 2015-02-02 DIAGNOSIS — E8779 Other fluid overload: Secondary | ICD-10-CM | POA: Diagnosis not present

## 2015-02-02 DIAGNOSIS — D6489 Other specified anemias: Secondary | ICD-10-CM | POA: Diagnosis not present

## 2015-02-02 DIAGNOSIS — E1122 Type 2 diabetes mellitus with diabetic chronic kidney disease: Secondary | ICD-10-CM | POA: Diagnosis present

## 2015-02-02 DIAGNOSIS — E669 Obesity, unspecified: Secondary | ICD-10-CM | POA: Diagnosis present

## 2015-02-02 LAB — CBC WITH DIFFERENTIAL/PLATELET
BASOS ABS: 0 10*3/uL (ref 0.0–0.1)
BASOS PCT: 0 %
EOS ABS: 0.2 10*3/uL (ref 0.0–0.7)
Eosinophils Relative: 2 %
HEMATOCRIT: 36.3 % — AB (ref 39.0–52.0)
HEMOGLOBIN: 10.6 g/dL — AB (ref 13.0–17.0)
Lymphocytes Relative: 13 %
Lymphs Abs: 1.1 10*3/uL (ref 0.7–4.0)
MCH: 27.8 pg (ref 26.0–34.0)
MCHC: 29.2 g/dL — AB (ref 30.0–36.0)
MCV: 95.3 fL (ref 78.0–100.0)
MONOS PCT: 5 %
Monocytes Absolute: 0.4 10*3/uL (ref 0.1–1.0)
NEUTROS ABS: 6.7 10*3/uL (ref 1.7–7.7)
NEUTROS PCT: 80 %
Platelets: 155 10*3/uL (ref 150–400)
RBC: 3.81 MIL/uL — AB (ref 4.22–5.81)
RDW: 16.5 % — ABNORMAL HIGH (ref 11.5–15.5)
WBC: 8.4 10*3/uL (ref 4.0–10.5)

## 2015-02-02 LAB — POCT I-STAT, CHEM 8
BUN: >140 mg/dL — ABNORMAL HIGH (ref 6–20)
BUN: 129 mg/dL — ABNORMAL HIGH (ref 6–20)
BUN: 99 mg/dL — AB (ref 6–20)
BUN: 87 mg/dL — AB (ref 6–20)
CALCIUM ION: 0.85 mmol/L — AB (ref 1.12–1.23)
CALCIUM ION: 0.9 mmol/L — AB (ref 1.12–1.23)
CHLORIDE: 107 mmol/L (ref 101–111)
CHLORIDE: 106 mmol/L (ref 101–111)
CREATININE: 17 mg/dL — AB (ref 0.61–1.24)
CREATININE: 16 mg/dL — AB (ref 0.61–1.24)
Calcium, Ion: 0.81 mmol/L — ABNORMAL LOW (ref 1.12–1.23)
Calcium, Ion: 0.93 mmol/L — ABNORMAL LOW (ref 1.12–1.23)
Chloride: 109 mmol/L (ref 101–111)
Chloride: 103 mmol/L (ref 101–111)
Creatinine, Ser: >18 mg/dL — ABNORMAL HIGH (ref 0.61–1.24)
Creatinine, Ser: >18 mg/dL — ABNORMAL HIGH (ref 0.61–1.24)
GLUCOSE: 138 mg/dL — AB (ref 65–99)
GLUCOSE: 118 mg/dL — AB (ref 65–99)
GLUCOSE: 130 mg/dL — AB (ref 65–99)
Glucose, Bld: 130 mg/dL — ABNORMAL HIGH (ref 65–99)
HCT: 33 % — ABNORMAL LOW (ref 39.0–52.0)
HCT: 37 % — ABNORMAL LOW (ref 39.0–52.0)
HCT: 38 % — ABNORMAL LOW (ref 39.0–52.0)
HEMATOCRIT: 44 % (ref 39.0–52.0)
HEMOGLOBIN: 11.2 g/dL — AB (ref 13.0–17.0)
HEMOGLOBIN: 15 g/dL (ref 13.0–17.0)
Hemoglobin: 12.6 g/dL — ABNORMAL LOW (ref 13.0–17.0)
Hemoglobin: 12.9 g/dL — ABNORMAL LOW (ref 13.0–17.0)
POTASSIUM: 7 mmol/L — AB (ref 3.5–5.1)
Potassium: 5.1 mmol/L (ref 3.5–5.1)
Potassium: 4 mmol/L (ref 3.5–5.1)
Potassium: 3.8 mmol/L (ref 3.5–5.1)
SODIUM: 146 mmol/L — AB (ref 135–145)
Sodium: 144 mmol/L (ref 135–145)
Sodium: 144 mmol/L (ref 135–145)
Sodium: 144 mmol/L (ref 135–145)
TCO2: 17 mmol/L (ref 0–100)
TCO2: 20 mmol/L (ref 0–100)
TCO2: 22 mmol/L (ref 0–100)
TCO2: 22 mmol/L (ref 0–100)

## 2015-02-02 LAB — COMPREHENSIVE METABOLIC PANEL
ALBUMIN: 3.8 g/dL (ref 3.5–5.0)
ALK PHOS: 65 U/L (ref 38–126)
ALT: 12 U/L — AB (ref 17–63)
ANION GAP: 22 — AB (ref 5–15)
AST: 16 U/L (ref 15–41)
BILIRUBIN TOTAL: 0.5 mg/dL (ref 0.3–1.2)
BUN: 164 mg/dL — AB (ref 6–20)
CALCIUM: 6.6 mg/dL — AB (ref 8.9–10.3)
CO2: 19 mmol/L — AB (ref 22–32)
CREATININE: 27.07 mg/dL — AB (ref 0.61–1.24)
Chloride: 104 mmol/L (ref 101–111)
GFR calc Af Amer: 2 mL/min — ABNORMAL LOW (ref >60)
GFR calc non Af Amer: 2 mL/min — ABNORMAL LOW (ref >60)
GLUCOSE: 118 mg/dL — AB (ref 65–99)
Potassium: 7.1 mmol/L (ref 3.5–5.1)
SODIUM: 145 mmol/L (ref 135–145)
TOTAL PROTEIN: 7.2 g/dL (ref 6.5–8.1)

## 2015-02-02 LAB — BASIC METABOLIC PANEL
Anion gap: 23 — ABNORMAL HIGH (ref 5–15)
BUN: 175 mg/dL — ABNORMAL HIGH (ref 6–20)
CALCIUM: 6.7 mg/dL — AB (ref 8.9–10.3)
CO2: 18 mmol/L — AB (ref 22–32)
CREATININE: 26.42 mg/dL — AB (ref 0.61–1.24)
Chloride: 105 mmol/L (ref 101–111)
GFR, EST AFRICAN AMERICAN: 2 mL/min — AB (ref >60)
GFR, EST NON AFRICAN AMERICAN: 2 mL/min — AB (ref >60)
Glucose, Bld: 137 mg/dL — ABNORMAL HIGH (ref 65–99)
Potassium: >7.5 mmol/L (ref 3.5–5.1)
SODIUM: 146 mmol/L — AB (ref 135–145)

## 2015-02-02 LAB — POCT I-STAT 3, ART BLOOD GAS (G3+)
ACID-BASE DEFICIT: 9 mmol/L — AB (ref 0.0–2.0)
ACID-BASE DEFICIT: 9 mmol/L — AB (ref 0.0–2.0)
BICARBONATE: 19.3 meq/L — AB (ref 20.0–24.0)
BICARBONATE: 22.7 meq/L (ref 20.0–24.0)
O2 SAT: 71 %
O2 Saturation: 81 %
PH ART: 7.167 — AB (ref 7.350–7.450)
PO2 ART: 52 mmHg — AB (ref 80.0–100.0)
Patient temperature: 97.1
TCO2: 21 mmol/L (ref 0–100)
TCO2: 25 mmol/L (ref 0–100)
pCO2 arterial: 52.8 mmHg — ABNORMAL HIGH (ref 35.0–45.0)
pCO2 arterial: 80.3 mmHg (ref 35.0–45.0)
pH, Arterial: 7.054 — CL (ref 7.350–7.450)
pO2, Arterial: 55 mmHg — ABNORMAL LOW (ref 80.0–100.0)

## 2015-02-02 LAB — MRSA PCR SCREENING: MRSA BY PCR: NEGATIVE

## 2015-02-02 LAB — POTASSIUM: Potassium: 7.1 mmol/L (ref 3.5–5.1)

## 2015-02-02 LAB — BRAIN NATRIURETIC PEPTIDE: B Natriuretic Peptide: 485.4 pg/mL — ABNORMAL HIGH (ref 0.0–100.0)

## 2015-02-02 MED ORDER — ROCURONIUM BROMIDE 50 MG/5ML IV SOLN
INTRAVENOUS | Status: AC
  Administered 2015-02-02: 10 mg
  Filled 2015-02-02: qty 2

## 2015-02-02 MED ORDER — ALBUTEROL SULFATE (2.5 MG/3ML) 0.083% IN NEBU
3.0000 mL | INHALATION_SOLUTION | Freq: Four times a day (QID) | RESPIRATORY_TRACT | Status: DC | PRN
  Administered 2015-02-03: 3 mL via RESPIRATORY_TRACT
  Filled 2015-02-02: qty 3

## 2015-02-02 MED ORDER — MIDAZOLAM HCL 2 MG/2ML IJ SOLN
4.0000 mg | Freq: Once | INTRAMUSCULAR | Status: AC
  Administered 2015-02-02: 4 mg via INTRAVENOUS

## 2015-02-02 MED ORDER — DEXTROSE 50 % IV SOLN
50.0000 mL | Freq: Once | INTRAVENOUS | Status: AC
  Administered 2015-02-02: 50 mL via INTRAVENOUS
  Filled 2015-02-02: qty 50

## 2015-02-02 MED ORDER — CALCIUM GLUCONATE 10 % IV SOLN
1.0000 g | Freq: Once | INTRAVENOUS | Status: AC
  Administered 2015-02-02: 1 g via INTRAVENOUS
  Filled 2015-02-02: qty 10

## 2015-02-02 MED ORDER — ETOMIDATE 2 MG/ML IV SOLN
INTRAVENOUS | Status: AC
  Filled 2015-02-02: qty 20

## 2015-02-02 MED ORDER — MIDAZOLAM HCL 2 MG/2ML IJ SOLN
INTRAMUSCULAR | Status: AC
  Filled 2015-02-02: qty 2

## 2015-02-02 MED ORDER — SODIUM POLYSTYRENE SULFONATE 15 GM/60ML PO SUSP
30.0000 g | Freq: Once | ORAL | Status: AC
  Administered 2015-02-02: 30 g via ORAL
  Filled 2015-02-02: qty 120

## 2015-02-02 MED ORDER — HEPARIN SODIUM (PORCINE) 1000 UNIT/ML DIALYSIS
5000.0000 [IU] | Freq: Once | INTRAMUSCULAR | Status: AC
  Administered 2015-02-03: 5000 [IU] via INTRAVENOUS_CENTRAL
  Filled 2015-02-02: qty 5

## 2015-02-02 MED ORDER — LIDOCAINE HCL (CARDIAC) 20 MG/ML IV SOLN
INTRAVENOUS | Status: AC
  Filled 2015-02-02: qty 5

## 2015-02-02 MED ORDER — ROCURONIUM BROMIDE 50 MG/5ML IV SOLN
INTRAVENOUS | Status: AC
  Filled 2015-02-02: qty 2

## 2015-02-02 MED ORDER — SUCCINYLCHOLINE CHLORIDE 20 MG/ML IJ SOLN
INTRAMUSCULAR | Status: AC
  Filled 2015-02-02: qty 1

## 2015-02-02 MED ORDER — OXYCODONE HCL 5 MG PO TABS: 10.0000 mg | ORAL_TABLET | Freq: Three times a day (TID) | ORAL | Status: DC | PRN

## 2015-02-02 MED ORDER — FENTANYL CITRATE (PF) 100 MCG/2ML IJ SOLN
INTRAMUSCULAR | Status: AC
  Administered 2015-02-02: 50 ug
  Filled 2015-02-02: qty 2

## 2015-02-02 MED ORDER — FENTANYL CITRATE (PF) 100 MCG/2ML IJ SOLN
50.0000 ug | Freq: Once | INTRAMUSCULAR | Status: AC
  Administered 2015-02-02: 50 ug via INTRAVENOUS

## 2015-02-02 MED ORDER — AMITRIPTYLINE HCL 25 MG PO TABS
25.0000 mg | ORAL_TABLET | Freq: Every day | ORAL | Status: DC
  Administered 2015-02-02 – 2015-02-04 (×3): 25 mg via ORAL
  Filled 2015-02-02 (×5): qty 1

## 2015-02-02 MED ORDER — SEVELAMER CARBONATE 800 MG PO TABS
1600.0000 mg | ORAL_TABLET | Freq: Three times a day (TID) | ORAL | Status: DC
  Administered 2015-02-02 – 2015-02-03 (×4): 1600 mg via ORAL
  Filled 2015-02-02 (×4): qty 2

## 2015-02-02 MED ORDER — OXYCODONE HCL 10 MG PO TABS: 10.0000 mg | ORAL_TABLET | Freq: Three times a day (TID) | ORAL | Status: DC | PRN

## 2015-02-02 MED ORDER — MIDAZOLAM HCL 2 MG/2ML IJ SOLN
INTRAMUSCULAR | Status: AC
  Filled 2015-02-02: qty 4

## 2015-02-02 MED ORDER — FENTANYL BOLUS VIA INFUSION
50.0000 ug | INTRAVENOUS | Status: DC | PRN
  Filled 2015-02-02: qty 50

## 2015-02-02 MED ORDER — ANTISEPTIC ORAL RINSE SOLUTION (CORINZ)
7.0000 mL | OROMUCOSAL | Status: DC
  Administered 2015-02-02 – 2015-02-04 (×15): 7 mL via OROMUCOSAL

## 2015-02-02 MED ORDER — HEPARIN SODIUM (PORCINE) 1000 UNIT/ML DIALYSIS: 20.0000 [IU]/kg | INTRAMUSCULAR | Status: DC | PRN

## 2015-02-02 MED ORDER — OMEGA-3-ACID ETHYL ESTERS 1 G PO CAPS
1.0000 g | ORAL_CAPSULE | Freq: Two times a day (BID) | ORAL | Status: DC
Start: 2015-02-02 — End: 2015-02-05
  Administered 2015-02-03 – 2015-02-05 (×4): 1 g via ORAL
  Filled 2015-02-02 (×6): qty 1

## 2015-02-02 MED ORDER — HEPARIN SODIUM (PORCINE) 1000 UNIT/ML DIALYSIS: 1000.0000 [IU] | INTRAMUSCULAR | Status: DC | PRN

## 2015-02-02 MED ORDER — ETOMIDATE 2 MG/ML IV SOLN
INTRAVENOUS | Status: AC
  Administered 2015-02-02: 70 mg
  Filled 2015-02-02: qty 20

## 2015-02-02 MED ORDER — FENTANYL CITRATE (PF) 100 MCG/2ML IJ SOLN: 50.0000 ug | Freq: Once | INTRAMUSCULAR | Status: AC

## 2015-02-02 MED ORDER — CHLORHEXIDINE GLUCONATE 0.12 % MT SOLN
15.0000 mL | Freq: Two times a day (BID) | OROMUCOSAL | Status: DC
  Administered 2015-02-02 – 2015-02-04 (×5): 15 mL via OROMUCOSAL
  Filled 2015-02-02: qty 15

## 2015-02-02 MED ORDER — SODIUM POLYSTYRENE SULFONATE 15 GM/60ML PO SUSP
45.0000 g | Freq: Once | ORAL | Status: AC
  Administered 2015-02-02: 45 g via ORAL
  Filled 2015-02-02: qty 180

## 2015-02-02 MED ORDER — OMEGA-3 1000 MG PO CAPS: 1000.0000 mg | ORAL_CAPSULE | Freq: Two times a day (BID) | ORAL | Status: DC

## 2015-02-02 MED ORDER — SODIUM CHLORIDE 0.9 % IV SOLN: 100.0000 mL | INTRAVENOUS | Status: DC | PRN

## 2015-02-02 MED ORDER — CALCIUM GLUCONATE 10 % IV SOLN
1.0000 g | Freq: Once | INTRAVENOUS | Status: AC
  Administered 2015-02-02: 1 g via INTRAVENOUS
  Filled 2015-02-02 (×2): qty 10

## 2015-02-02 MED ORDER — FENTANYL CITRATE (PF) 100 MCG/2ML IJ SOLN
INTRAMUSCULAR | Status: AC
  Filled 2015-02-02: qty 4

## 2015-02-02 MED ORDER — SODIUM CHLORIDE 0.9 % IV SOLN
25.0000 ug/h | INTRAVENOUS | Status: DC
  Administered 2015-02-02: 50 ug/h via INTRAVENOUS
  Administered 2015-02-02 – 2015-02-03 (×2): 250 ug/h via INTRAVENOUS
  Administered 2015-02-03: 100 ug/h via INTRAVENOUS
  Administered 2015-02-03: 50 ug/h via INTRAVENOUS
  Filled 2015-02-02 (×3): qty 50

## 2015-02-02 MED ORDER — CLONAZEPAM 1 MG PO TABS
1.0000 mg | ORAL_TABLET | Freq: Two times a day (BID) | ORAL | Status: DC | PRN
  Administered 2015-02-03 – 2015-02-04 (×2): 1 mg via ORAL
  Filled 2015-02-02 (×2): qty 1

## 2015-02-02 MED ORDER — PANTOPRAZOLE SODIUM 40 MG PO TBEC: 40.0000 mg | DELAYED_RELEASE_TABLET | Freq: Every day | ORAL | Status: DC

## 2015-02-02 MED ORDER — SODIUM BICARBONATE 8.4 % IV SOLN
INTRAVENOUS | Status: AC
  Administered 2015-02-02: 50 meq
  Filled 2015-02-02: qty 50

## 2015-02-02 MED ORDER — LIDOCAINE HCL (PF) 1 % IJ SOLN: 5.0000 mL | INTRAMUSCULAR | Status: DC | PRN

## 2015-02-02 MED ORDER — PENTAFLUOROPROP-TETRAFLUOROETH EX AERO: 1.0000 "application " | INHALATION_SPRAY | CUTANEOUS | Status: DC | PRN

## 2015-02-02 MED ORDER — CHLORHEXIDINE GLUCONATE 0.12% ORAL RINSE (MEDLINE KIT): 15.0000 mL | Freq: Two times a day (BID) | OROMUCOSAL | Status: DC

## 2015-02-02 MED ORDER — SODIUM BICARBONATE 8.4 % IV SOLN
50.0000 meq | Freq: Once | INTRAVENOUS | Status: AC
  Administered 2015-02-02: 50 meq via INTRAVENOUS
  Filled 2015-02-02: qty 50

## 2015-02-02 MED ORDER — FENTANYL CITRATE (PF) 100 MCG/2ML IJ SOLN
INTRAMUSCULAR | Status: AC
  Filled 2015-02-02: qty 2

## 2015-02-02 MED ORDER — VITAMIN E 180 MG (400 UNIT) PO CAPS
400.0000 [IU] | ORAL_CAPSULE | Freq: Every day | ORAL | Status: DC
  Administered 2015-02-04 – 2015-02-05 (×2): 400 [IU] via ORAL
  Filled 2015-02-02 (×4): qty 1

## 2015-02-02 MED ORDER — ANTISEPTIC ORAL RINSE SOLUTION (CORINZ): 7.0000 mL | Freq: Four times a day (QID) | OROMUCOSAL | Status: DC

## 2015-02-02 MED ORDER — SODIUM BICARBONATE 8.4 % IV SOLN
INTRAVENOUS | Status: DC
  Filled 2015-02-02 (×2): qty 150

## 2015-02-02 MED ORDER — RENA-VITE PO TABS
1.0000 | ORAL_TABLET | Freq: Every day | ORAL | Status: DC
  Administered 2015-02-02 – 2015-02-04 (×3): 1 via ORAL
  Filled 2015-02-02 (×3): qty 1

## 2015-02-02 MED ORDER — PANTOPRAZOLE SODIUM 40 MG PO PACK
40.0000 mg | PACK | Freq: Every day | ORAL | Status: DC
  Administered 2015-02-02 – 2015-02-03 (×2): 40 mg
  Filled 2015-02-02 (×2): qty 20

## 2015-02-02 MED ORDER — ETOMIDATE 2 MG/ML IV SOLN
20.0000 mg | Freq: Once | INTRAVENOUS | Status: AC
  Administered 2015-02-02: 20 mg via INTRAVENOUS

## 2015-02-02 MED ORDER — ALTEPLASE 2 MG IJ SOLR
2.0000 mg | Freq: Once | INTRAMUSCULAR | Status: DC | PRN
  Filled 2015-02-02: qty 2

## 2015-02-02 MED ORDER — CETYLPYRIDINIUM CHLORIDE 0.05 % MT LIQD
7.0000 mL | Freq: Two times a day (BID) | OROMUCOSAL | Status: DC
  Administered 2015-02-02 (×2): 7 mL via OROMUCOSAL

## 2015-02-02 MED ORDER — LIDOCAINE-PRILOCAINE 2.5-2.5 % EX CREA: 1.0000 "application " | TOPICAL_CREAM | CUTANEOUS | Status: DC | PRN

## 2015-02-02 MED ORDER — LIDOCAINE-PRILOCAINE 2.5-2.5 % EX CREA
1.0000 "application " | TOPICAL_CREAM | CUTANEOUS | Status: DC | PRN
  Filled 2015-02-02: qty 5

## 2015-02-02 MED ORDER — INSULIN ASPART 100 UNIT/ML IV SOLN
10.0000 [IU] | Freq: Once | INTRAVENOUS | Status: AC
  Administered 2015-02-02: 10 [IU] via INTRAVENOUS
  Filled 2015-02-02: qty 1

## 2015-02-02 MED ORDER — METOCLOPRAMIDE HCL 10 MG PO TABS
10.0000 mg | ORAL_TABLET | Freq: Every day | ORAL | Status: DC
  Administered 2015-02-02 – 2015-02-05 (×4): 10 mg via ORAL
  Filled 2015-02-02 (×4): qty 1

## 2015-02-02 MED ORDER — HEPARIN SODIUM (PORCINE) 5000 UNIT/ML IJ SOLN
5000.0000 [IU] | Freq: Three times a day (TID) | INTRAMUSCULAR | Status: DC
  Administered 2015-02-02 – 2015-02-05 (×8): 5000 [IU] via SUBCUTANEOUS
  Filled 2015-02-02 (×8): qty 1

## 2015-02-02 NOTE — Procedures (Signed)
  I was present at this dialysis session, have reviewed the session itself and made  appropriate changes [name] MD [facility] pager [phone]    cell [phone] 02/02/2015, 10:59 AM

## 2015-02-02 NOTE — ED Notes (Signed)
Admitting at bedside 

## 2015-02-02 NOTE — Progress Notes (Signed)
CRITICAL VALUE ALERT  Critical value received:  K+: 7.5, slightly hemolized, K+: IStat: 5.1  Date of notification:  02/02/15  Time of notification:  1115  Critical value read back:Yes.    Nurse who received alert:  Cay Schillings, RN  MD notified (1st page):  Not applicable due to current I Stat value (K+ 5.1). Potassium trending down, patient receiving hemodialysis. Will continue to monitor.

## 2015-02-02 NOTE — Progress Notes (Addendum)
CRITICAL VALUE ALERT  Critical value received: ph: 7.044, PCO2 83.3, PO2 55  Date of notification:  11/14 Time of notification:  0605  Critical value read back: Yes   Nurse who received alert: Hazel Sams RN  MD notified (1st page):  MD Tyson Alias   Time of first page:  0610  MD notified (2nd page):  Time of second page:  Responding MD: MD Tyson Alias  Time MD responded:  [phone]

## 2015-02-02 NOTE — Consult Note (Signed)
Renal Service Consult Note [facility]  Gary Rivera 02/02/2015 Gary Rivera D Requesting Physician:  Dr Vassie Loll  Reason for Consult: ESRD patient w resp failure/ high K + HPI: The patient is a 54 y.o. year-old with hx of HTN, CHF and ESRD on home hemodialysis (4days/ wk x 3.5 h).  Was on PD prior to that but had to switch due to fungal peritonitis in 2014.  Patient presented with trouble accessing HD fistula, no HD since Friday.  SOB/ coughing, obvious rhonchi on arrival to ED.  BP 140/ 90. Got worse overnight requiring bipap then transfer to ICU and intubation by CCM for resp distress. K 7.5.  Asked to see for HD.    ATtempted to access AVF and removed clots.  Fem cath placed by CCM and HD pending now.  QRS narrow on tele, has rec'd Ca/ ins/ glu for high K.  CXR showing IS edema pattern.    Chart review: April 16 - a/c resp failure/ pulm edema/ vol overload, rx with HD . Anxiety/ depression/ combined CHF, ESRD on HD, anemia.  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 Fu [place] [facility] Jun 13 - resp failure acute/ chron hypercarb, hypotension > rx with PD, vol removal, midodrine. Norm cortisol.   ROS  n/a  Past Medical History  Past Medical History  Diagnosis Date  . ESRD on hemodialysis (HCC)     Started dialysis with PD in 2013 and switched to HD in spring 2014 because of fungal peritonitis.  Takes dialysis at home 5 days a week approx 3h 15 min each session.     . CHF (congestive heart failure) (HCC)   . Peritoneal dialysis catheter in place [facility])   . Anemia     patient reporats that last hgb 7.9 a week ago  .  Anxiety state, unspecified   . Hypertension    Past Surgical History  Past Surgical History  Procedure Laterality Date  . Appendectomy    . Peritoneal catheter insertion    . Tee without cardioversion N/A 07/11/2013    Procedure: TRANSESOPHAGEAL ECHOCARDIOGRAM (TEE);  Surgeon: Pricilla Riffle, MD;  Location: [facility] ENDOSCOPY;  Service: Cardiovascular;  Laterality: N/A;  . Cardioversion N/A 07/11/2013    Procedure: CARDIOVERSION;  Surgeon: Pricilla Riffle, MD;  Location: [facility] ENDOSCOPY;  Service: Cardiovascular;  Laterality: N/A;  . Knee arthroscopy Right 05/18/2014    Procedure: ARTHROSCOPIC WASHOUT OF RIGHT KNEE;  Surgeon: Sheral Apley, MD;  Location: [facility] OR;  Service: Orthopedics;  Laterality: Right;   Family History  Family History  Problem Relation Age of Onset  . Diabetes Mother   . Hypertension Mother   . Hypertension Sister   . Asthma Sister    Social History  reports that he has never smoked. He has never used smokeless tobacco. He reports that he does not drink alcohol or use illicit drugs. Allergies  Allergies  Allergen Reactions  . Renvela [Sevelamer] Nausea And Vomiting   Home medications Prior to Admission medications   Medication Sig Start Date End Date Taking? Authorizing  Provider  albuterol (PROVENTIL HFA;VENTOLIN HFA) 108 (90 BASE) MCG/ACT inhaler Inhale 1 puff into the lungs every 6 (six) hours as needed for wheezing or shortness of breath.   Yes Historical Provider, MD  amitriptyline (ELAVIL) 25 MG tablet Take 25 mg by mouth at bedtime.   Yes Historical Provider, MD  clonazePAM (KLONOPIN) 0.5 MG tablet Take 1 mg by mouth 2 (two) times daily as needed for anxiety.    Yes Historical Provider, MD  metoCLOPramide (REGLAN) 10 MG tablet Take 10 mg by mouth daily. 05/03/14  Yes Historical Provider, MD  multivitamin (RENA-VIT) TABS tablet Take 1 tablet by mouth at bedtime. 05/21/14  Yes [name], DO  Omega-3 1000 MG CAPS Take 1 g by mouth 2 (two) times daily.   Yes Historical  Provider, MD  omeprazole (PRILOSEC) 10 MG capsule Take 10 mg by mouth daily.   Yes Historical Provider, MD  Oxycodone HCl 10 MG TABS Take 1 tablet (10 mg total) by mouth 3 (three) times daily as needed. Patient taking differently: Take 10 mg by mouth 3 (three) times daily as needed (pain).  05/21/14  Yes [name], DO  sevelamer carbonate (RENVELA) 800 MG tablet Take 1,600 mg by mouth 3 (three) times daily with meals.   Yes Historical Provider, MD  vitamin E 400 UNIT capsule Take 400 Units by mouth daily.   Yes Historical Provider, MD   Liver Function Tests  Recent Labs Lab 02/02/15 0035  AST 16  ALT 12*  ALKPHOS 65  BILITOT 0.5  PROT 7.2  ALBUMIN 3.8   No results for input(s): LIPASE, AMYLASE in the last 168 hours. CBC  Recent Labs Lab 02/02/15 0035  WBC 8.4  NEUTROABS 6.7  HGB 10.6*  HCT 36.3*  MCV 95.3  PLT 155   Basic Metabolic Panel  Recent Labs Lab 02/02/15 0035 02/02/15 0155  NA 145  --   K 7.1* 7.1*  CL 104  --   CO2 19*  --   GLUCOSE 118*  --   BUN 164*  --   CREATININE 27.07*  --   CALCIUM 6.6*  --     Filed Vitals:   02/02/15 0607 02/02/15 0615 02/02/15 0715 02/02/15 0829  BP: 194/102 196/87 87/61   Pulse: 92 93 80   Temp:    98.6 F (37 C)  TempSrc:    Axillary  Resp: 25 24 28    Weight:      SpO2: 100% 100% 100%    Exam Obese AAM on vent sedates No rash, cyanosis or gangrene Sclera anicteric, throat clear No jvd Chest coarse BS bilat, no wheezing RRR tachy no RG Abd obese +bs mod distended no hsm GU normal male EXt 1+ edema LE's bilat L forearm AVF +bruit no signs of infection Neuro sedated on vent, not responsive  K 7.1  BUN 164  Cr 27   Ca 6.6 EKG reviewed > NSR , QRS prolonged 104 msec, no acute chgs  Home HD  4d/wk x 3.5h each   139kg   Hep 5000  2/2.25 bath  LFA AVF, had fistulogram last week at [facility]  Assessment: 1. Acute resp failure/ pulm edema / vol overload 2. Hyperkalemia 3. HD access malfunction - AVF not  working in OP setting and could not access this am 4. ESRD HD at home 5. HTN 6. DM 7. Obesity / OHS/ chronic hypercarbia 8. Uremia/ severe azotemia - d/t poor access function of late   Plan- acute HD  via temp cath this am  [name] MD [facility] pager [phone]    cell [phone] 02/02/2015, 8:50 AM

## 2015-02-02 NOTE — Progress Notes (Signed)
Notified MD R. Anouk Critzer from [facility] in regards to patient's abnormal ABG results. Pt is currently on BIPAP and has been for at least 2 hrs. Ventilation settings have been changed based on those results per respiratory therapist. Pt is lethargic but able to arouse by voice. Patient is oriented to self and place but not to time or situation. CCM also made aware. Nephrology has also been made aware as well in regards to patient needing HD. Will continue to monitor and assess patient's LOC and respiratory status. Will recheck ABG in an hour

## 2015-02-02 NOTE — H&P (Signed)
[facility] History and Physical  Gary Rivera ZDG:[id number] DOB: Apr 21, 1960 DOA: 02/01/2015  Referring physician: ED PCP: [name], MD   Chief Complaint: Shortness of breath  HPI:  Patient is a 54 year old male with past medical history of ESRD on home hemodialysis, oxygen dependent 2 L, obesity, CHF, anxiety, hypertension; who presents with progressively worsening shortness of breath the last 2 days. Note history is hard to obtain as patient is only able to speak in 1-2 word sentences on nasal cannula oxygen. Patient notes that for the last couple days he was having trouble accessing his fistulogram for dialysis. His last day of dialysis may have been Thursday. He contacted a care nurse and a person was going to be sent out on Monday. However, he continued to have progressive worsening shortness of breath with cough and decided to come to the emergency department for further evaluation. Upon arrival to the emergency department he was noted to have a elevated potassium level of 7.1, BUN of 164, creatinine of 27, early peaking T waves on EKG, and venous congestion with increased interstitial lung markings on chest x-ray. Patient also noted a previous history of bronchitis as as he felt that it may have been flaring up today    Review of Systems  Constitutional: Positive for malaise/fatigue and diaphoresis.  HENT: Negative for hearing loss and sore throat.   Eyes: Negative for discharge and redness.  Respiratory: Positive for cough, sputum production, shortness of breath and wheezing.   Cardiovascular: Negative for chest pain and palpitations.  Gastrointestinal: Negative for abdominal pain and diarrhea.  Genitourinary: Negative for hematuria and flank pain.  Musculoskeletal: Positive for joint pain. Negative for falls.  Skin: Negative for rash.  Neurological: Negative for sensory change and focal weakness.  Endo/Heme/Allergies: Positive for polydipsia. Negative for environmental  allergies.  Psychiatric/Behavioral: Negative for substance abuse. The patient is nervous/anxious.        Past Medical History  Diagnosis Date  . ESRD on hemodialysis (HCC)     Started dialysis with PD in 2013 and switched to HD in spring 2014 because of fungal peritonitis.  Takes dialysis at home 5 days a week approx 3h 15 min each session.     . CHF (congestive heart failure) (HCC)   . Peritoneal dialysis catheter in place [facility])   . Anemia     patient reporats that last hgb 7.9 a week ago  . Anxiety state, unspecified   . Hypertension      Past Surgical History  Procedure Laterality Date  . Appendectomy    . Peritoneal catheter insertion    . Tee without cardioversion N/A 07/11/2013    Procedure: TRANSESOPHAGEAL ECHOCARDIOGRAM (TEE);  Surgeon: Pricilla Riffle, MD;  Location[facility] ENDOSCOPY;  Service: Cardiovascular;  Laterality: N/A;  . Cardioversion N/A 07/11/2013    Procedure: CARDIOVERSION;  Surgeon: Pricilla Riffle, MD;  Location: [facility] ENDOSCOPY;  Service: Cardiovascular;  Laterality: N/A;  . Knee arthroscopy Right 05/18/2014    Procedure: ARTHROSCOPIC WASHOUT OF RIGHT KNEE;  Surgeon: Sheral Apley, MD;  Location[facility] OR;  Service: Orthopedics;  Laterality: Right;      Social History:  reports that he has never smoked. He has never used smokeless tobacco. He reports that he does not drink alcohol or use illicit drugs. Where does patient live--home  and with whom if at home? Can patient participate in ADLs? yes  Allergies  Allergen Reactions  . Renvela [Sevelamer] Nausea And Vomiting    Family History  Problem  Relation Age of Onset  . Diabetes Mother   . Hypertension Mother   . Hypertension Sister   . Asthma Sister        Prior to Admission medications   Medication Sig Start Date End Date Taking? Authorizing Provider  albuterol (PROVENTIL HFA;VENTOLIN HFA) 108 (90 BASE) MCG/ACT inhaler Inhale 1 puff into the lungs every 6 (six) hours as needed for wheezing or shortness  of breath.   Yes Historical Provider, MD  amitriptyline (ELAVIL) 25 MG tablet Take 25 mg by mouth at bedtime.   Yes Historical Provider, MD  clonazePAM (KLONOPIN) 0.5 MG tablet Take 1 mg by mouth 2 (two) times daily as needed for anxiety.    Yes Historical Provider, MD  metoCLOPramide (REGLAN) 10 MG tablet Take 10 mg by mouth daily. 05/03/14  Yes Historical Provider, MD  multivitamin (RENA-VIT) TABS tablet Take 1 tablet by mouth at bedtime. 05/21/14  Yes [name], DO  Omega-3 1000 MG CAPS Take 1 g by mouth 2 (two) times daily.   Yes Historical Provider, MD  omeprazole (PRILOSEC) 10 MG capsule Take 10 mg by mouth daily.   Yes Historical Provider, MD  Oxycodone HCl 10 MG TABS Take 1 tablet (10 mg total) by mouth 3 (three) times daily as needed. Patient taking differently: Take 10 mg by mouth 3 (three) times daily as needed (pain).  05/21/14  Yes [name], DO  sevelamer carbonate (RENVELA) 800 MG tablet Take 1,600 mg by mouth 3 (three) times daily with meals.   Yes Historical Provider, MD  vitamin E 400 UNIT capsule Take 400 Units by mouth daily.   Yes Historical Provider, MD     Physical Exam: Filed Vitals:   02/02/15 0200 02/02/15 0212 02/02/15 0215 02/02/15 0230  BP: 159/93 174/99 174/99 158/92  Pulse: 86 88 86 87  Resp: SpO2: 90% 94% 93% 91%     Constitutional: Vital signs reviewed. Patient is obese male in moderate respiratory distress speaking in 2-3 word sentences on nasal cannula . Alert and oriented x3.  Head: Normocephalic and atraumatic  Ear: TM normal bilaterally  Mouth: no erythema or exudates, MMM  Eyes: PERRL, EOMI, conjunctivae normal, No scleral icterus.  Neck: Supple, Trachea midline normal ROM, No JVD, mass, thyromegaly, or carotid bruit present.  Cardiovascular: RRR, S1 normal, S2 normal, no MRG, pulses symmetric and intact bilaterally  Pulmonary/Chest: Bilateral wheezes and rhonchi appreciated throughout both lung fields with crackles Abdominal:  Protuberant abdomen. Soft. Non-tender, non-distended, bowel sounds are normal, no masses, organomegaly, or guarding present.  GU: no CVA tenderness Musculoskeletal: No joint deformities, erythema, or stiffness, ROM full and no nontender Ext: Palpable thrill over her left forearm graft. Positive for pitting edema and no cyanosis, pulses palpable bilaterally (DP and PT)  Hematology: no cervical, inginal, or axillary adenopathy.  Neurological: A&O x3, Strenght is normal and symmetric bilaterally, cranial nerve II-XII are grossly intact, no focal motor deficit, sensory intact to light touch bilaterally.  Skin: Warm, dry and intact. Patient has multiple keloids present on different parts of his body. No rash, cyanosis, or clubbing.  Psychiatric: Normal mood and affect. speech and behavior is normal. Judgment and thought content normal. Cognition and memory are normal.      Data Review   Micro Results No results found for this or any previous visit (from the past 240 hour(s)).  Radiology Reports Dg Chest Port 1 View  02/02/2015  CLINICAL DATA:  Patient on hormone dialysis. Acute  onset of shortness of breath, with rhonchi. Initial encounter. EXAM: PORTABLE CHEST 1 VIEW COMPARISON:  Chest radiograph performed 06/26/2014 FINDINGS: The lungs are hypoexpanded. Vascular congestion is noted, with increased interstitial markings, concerning for mild pulmonary edema. A small left pleural effusion is noted. No pneumothorax is seen. The cardiomediastinal silhouette is borderline normal in size. No acute osseous abnormalities are seen. IMPRESSION: Lungs hypoexpanded. Vascular congestion, with increased interstitial markings, concerning for mild pulmonary edema. Small left pleural effusion noted. Electronically Signed   By: Roanna Raider M.D.   On: 02/02/2015 00:05     CBC  Recent Labs Lab 02/02/15 0035  WBC 8.4  HGB 10.6*  HCT 36.3*  PLT 155  MCV 95.3  MCH 27.8  MCHC 29.2*  RDW 16.5*  LYMPHSABS  1.1  MONOABS 0.4  EOSABS 0.2  BASOSABS 0.0    Chemistries   Recent Labs Lab 02/02/15 0035 02/02/15 0155  NA 145  --   K 7.1* 7.1*  CL 104  --   CO2 19*  --   GLUCOSE 118*  --   BUN 164*  --   CREATININE 27.07*  --   CALCIUM 6.6*  --   AST 16  --   ALT 12*  --   ALKPHOS 65  --   BILITOT 0.5  --    ------------------------------------------------------------------------------------------------------------------ CrCl cannot be calculated (Unknown ideal weight.). ------------------------------------------------------------------------------------------------------------------ No results for input(s): HGBA1C in the last 72 hours. ------------------------------------------------------------------------------------------------------------------ No results for input(s): CHOL, HDL, LDLCALC, TRIG, CHOLHDL, LDLDIRECT in the last 72 hours. ------------------------------------------------------------------------------------------------------------------ No results for input(s): TSH, T4TOTAL, T3FREE, THYROIDAB in the last 72 hours.  Invalid input(s): FREET3 ------------------------------------------------------------------------------------------------------------------ No results for input(s): VITAMINB12, FOLATE, FERRITIN, TIBC, IRON, RETICCTPCT in the last 72 hours.  Coagulation profile No results for input(s): INR, PROTIME in the last 168 hours.  No results for input(s): DDIMER in the last 72 hours.  Cardiac Enzymes No results for input(s): CKMB, TROPONINI, MYOGLOBIN in the last 168 hours.  Invalid input(s): CK ------------------------------------------------------------------------------------------------------------------ Invalid input(s): POCBNP   CBG: No results for input(s): GLUCAP in the last 168 hours.     EKG: Independently reviewed. Sinus rhythm with Prolonged QTC 493   Assessment/Plan  Fluid overload: Patient with ESRD on home hemodialysis unable to have  dialysis secondary to difficulty with graft access. Presents with pulmonary congestion on chest x-ray, BUN of 164 creatinine of 27, BnP 485. Patient with significant respiratory distress on nasal cannula oxygen which improved with BiPAP. ED physician contacted nephrology spoke with Dr. Lowell Guitar and they will dialyze patient at 6 AM. Contact Dr. Lowell Guitar at 5:17 AM and will dialyze patient now. -Admit to stepdown -Continue BiPAP as tolerated -Nephro to dialyze   Hyperkalemia Acute. patient's initial potassium was noted to be 7.1 patient was given 15g Kayexalate, 10 unts of insulin, and 2g  of calcium gluconate. Nephro called regarding this issue as patient did show signs of peakingT waves on EKG -Repeat BMP ordered  -Patient needs dialysis   Acute hypercapnic and hypoxic respiratory failure. Patient with ABG significant for pH of 7.156 PCO2 of 54.7 and a bicarbonate of 19. Despite being on BiPAP 2 hours. Appearing somewhat more lethargic -Consult [facility] to come on board for further evaluation just in case patient requires intubation prior to receiving dialysis - repeat ABG following dialysis  ESRD on hemodialysis: Chronic Patient reports difficulty with accessing graft. -Electrolyte abnormalities to be readdressed during dialysis -Per nephrology/vascular regarding graft issues -Otherwise as seen above  HTN (hypertension):Uncontrolled at this time.  Partly due to patient's fluid over load status.    Anxiety  - continue klonipin  Anemia of chronic disease. Patient end-stage renal disease patient . Blood counts appear similar to  Baseline.    Code Status:   full Family Communication: bedside Disposition Plan: admit   Total time spent 55 minutes.Greater than 50% of this time was spent in counseling, explanation of diagnosis, planning of further management, and coordination of care  Clydie Braun [facility] Pager [phone]  If 7PM-7AM, please contact  night-coverage [email or web address] Password [email or web address] 02/02/2015, 3:15 AM

## 2015-02-02 NOTE — Consult Note (Signed)
PULMONARY / CRITICAL CARE MEDICINE   Name: Gary Rivera MRN: [id number] DOB: Apr 22, 1960    ADMISSION DATE:  02/01/2015 CONSULTATION DATE:  02/02/2015  REFERRING MD :  [facility]  CHIEF COMPLAINT:  resp distress   INITIAL PRESENTATION: 54 year old. End-stage renal disease on home dialysis with access issues, emergently intubated 11/14 for volume overload, acidosis and hyperkalemia and required emergent dialysis  STUDIES:  Echo 04/2014-EF 45%, basal akinesis  SIGNIFICANT EVENTS: 11/14 intubated   HISTORY OF PRESENT ILLNESS:  He has end-stage renal disease on hemodialysis for 3 years. Failed peritoneal dialysis and was started on hemodialysis-finally went to home hemodialysis. Had access issues. Left arm AV fistula has been giving intermittent problems. Per sister who helps him with dialysis, last session was 3 days ago-she does not remember how much fluid was taken off or what his baseline weight is. He was admitted on 11/13 with increasing dyspnea-noted to be hyperkalemic to 7.1 with a creatinine of 27 and BUN of 164. Chest x-ray showed congestion, was placed on BiPAP Was intubated earlier for increasing respiratory acidosis on BiPAP   PAST MEDICAL HISTORY :   has a past medical history of ESRD on hemodialysis (HCC); CHF (congestive heart failure) (HCC); Peritoneal dialysis catheter in place [facility] Anemia; Anxiety state, unspecified; and Hypertension.  has past surgical history that includes Appendectomy; Peritoneal catheter insertion; TEE without cardioversion (N/A, 07/11/2013); Cardioversion (N/A, 07/11/2013); and Knee arthroscopy (Right, 05/18/2014). Prior to Admission medications   Medication Sig Start Date End Date Taking? Authorizing Provider  albuterol (PROVENTIL HFA;VENTOLIN HFA) 108 (90 BASE) MCG/ACT inhaler Inhale 1 puff into the lungs every 6 (six) hours as needed for wheezing or shortness of breath.   Yes Historical Provider, MD  amitriptyline (ELAVIL) 25 MG tablet Take 25 mg  by mouth at bedtime.   Yes Historical Provider, MD  clonazePAM (KLONOPIN) 0.5 MG tablet Take 1 mg by mouth 2 (two) times daily as needed for anxiety.    Yes Historical Provider, MD  metoCLOPramide (REGLAN) 10 MG tablet Take 10 mg by mouth daily. 05/03/14  Yes Historical Provider, MD  multivitamin (RENA-VIT) TABS tablet Take 1 tablet by mouth at bedtime. 05/21/14  Yes [name], DO  Omega-3 1000 MG CAPS Take 1 g by mouth 2 (two) times daily.   Yes Historical Provider, MD  omeprazole (PRILOSEC) 10 MG capsule Take 10 mg by mouth daily.   Yes Historical Provider, MD  Oxycodone HCl 10 MG TABS Take 1 tablet (10 mg total) by mouth 3 (three) times daily as needed. Patient taking differently: Take 10 mg by mouth 3 (three) times daily as needed (pain).  05/21/14  Yes [name], DO  sevelamer carbonate (RENVELA) 800 MG tablet Take 1,600 mg by mouth 3 (three) times daily with meals.   Yes Historical Provider, MD  vitamin E 400 UNIT capsule Take 400 Units by mouth daily.   Yes Historical Provider, MD   Allergies  Allergen Reactions  . Renvela [Sevelamer] Nausea And Vomiting    FAMILY HISTORY:  indicated that his mother is deceased. He indicated that his sister is alive.  SOCIAL HISTORY:  reports that he has never smoked. He has never used smokeless tobacco. He reports that he does not drink alcohol or use illicit drugs.  REVIEW OF SYSTEMS:  Unable to obtain since intubated and sedated  SUBJECTIVE:   VITAL SIGNS: Pulse Rate:  [79-93] 80 (11/14 0715) Resp:  [16-30] 28 (11/14 0715) BP: (87-196)/(50-102) 87/61 mmHg (11/14 0715) SpO2:  [89 %-  100 %] 100 % (11/14 0715) FiO2 (%):  [100 %] 100 % (11/14 0650) Weight:  [146.1 kg (322 lb 1.5 oz)] 146.1 kg (322 lb 1.5 oz) (11/14 0417) HEMODYNAMICS:   VENTILATOR SETTINGS: Vent Mode:  [-] PRVC FiO2 (%):  [100 %] 100 % Set Rate:  [26 bmp] 26 bmp Vt Set:  [600 mL] 600 mL PEEP:  [10 cmH20] 10 cmH20 Plateau Pressure:  [27 cmH20] 27 cmH20 INTAKE /  OUTPUT: No intake or output data in the 24 hours ending 02/02/15 0804  PHYSICAL EXAMINATION: Gen.obese, in no distress, intubated and sedated ENT - no lesions, no post nasal drip, class 2 airway Neck: No JVD, no thyromegaly, no carotid bruits Lungs: no use of accessory muscles, no dullness to percussion, decreased without rales or rhonchi  Cardiovascular: Rhythm regular, heart sounds  normal, no murmurs or gallops, no peripheral edema, left arm AV fistula-no thrill Abdomen: soft and non-tender, no hepatosplenomegaly, BS normal. Musculoskeletal: No deformities, no cyanosis or clubbing Neuro: Sedated ,non focal prior by report, no tremors   LABS:  CBC  Recent Labs Lab 02/02/15 0035  WBC 8.4  HGB 10.6*  HCT 36.3*  PLT 155   Coag's No results for input(s): APTT, INR in the last 168 hours. BMET  Recent Labs Lab 02/02/15 0035 02/02/15 0155  NA 145  --   K 7.1* 7.1*  CL 104  --   CO2 19*  --   BUN 164*  --   CREATININE 27.07*  --   GLUCOSE 118*  --    Electrolytes  Recent Labs Lab 02/02/15 0035  CALCIUM 6.6*   Sepsis Markers No results for input(s): LATICACIDVEN, PROCALCITON, O2SATVEN in the last 168 hours. ABG  Recent Labs Lab 02/02/15 0443 02/02/15 0605  PHART 7.167* 7.054*  PCO2ART 52.8* 80.3*  PO2ART 55.0* 52.0*   Liver Enzymes  Recent Labs Lab 02/02/15 0035  AST 16  ALT 12*  ALKPHOS 65  BILITOT 0.5  ALBUMIN 3.8   Cardiac Enzymes No results for input(s): TROPONINI, PROBNP in the last 168 hours. Glucose No results for input(s): GLUCAP in the last 168 hours.  Imaging Dg Chest Port 1 View  02/02/2015  CLINICAL DATA:  Acute respiratory failure. EXAM: PORTABLE CHEST 1 VIEW COMPARISON:  02/01/2015 and 06/26/2014 FINDINGS: Endotracheal tube and NG tube appear in good position. Chronic cardiomegaly. Slight residual pulmonary vascular congestion, improved. No infiltrates or effusions. IMPRESSION: Improved pulmonary vascular congestion.  Chronic  cardiomegaly. Electronically Signed   By: Francene Boyers M.D.   On: 02/02/2015 07:51   Dg Chest Port 1 View  02/02/2015  CLINICAL DATA:  Patient on hormone dialysis. Acute onset of shortness of breath, with rhonchi. Initial encounter. EXAM: PORTABLE CHEST 1 VIEW COMPARISON:  Chest radiograph performed 06/26/2014 FINDINGS: The lungs are hypoexpanded. Vascular congestion is noted, with increased interstitial markings, concerning for mild pulmonary edema. A small left pleural effusion is noted. No pneumothorax is seen. The cardiomediastinal silhouette is borderline normal in size. No acute osseous abnormalities are seen. IMPRESSION: Lungs hypoexpanded. Vascular congestion, with increased interstitial markings, concerning for mild pulmonary edema. Small left pleural effusion noted. Electronically Signed   By: Roanna Raider M.D.   On: 02/02/2015 00:05     ASSESSMENT / PLAN:  PULMONARY OETT 11/14 >> A: Acute respiratory failure with hypercapnia No obstruction noted on PFTs 2013 P:   Ventilator bundle Albuterol noted on home meds-will use when necessary for wheezing  CARDIOVASCULAR CVL HD catheter 11/14 A: Acute pulmonary edema due  to volume overload P:  Check troponin 1  RENAL A:  ESRD on hemodialysis Nonfunctioning fistula Hyperkalemia and metabolic acidosis P:   Renal to consult Emergent HD Emergent catheter placement Kayexalate, bicarbonate, calcium 1  GASTROINTESTINAL A:  No issues P:   Nothing by mouth  HEMATOLOGIC A:  Anemia of chronic disease P:  Monitor Transfuse for hemoglobin less than 7  INFECTIOUS A:  No issues P:     ENDOCRINE A:  No issues   P:   CBGs while nothing by mouth  NEUROLOGIC A:  Acute encephalopathy-related to above P:   RASS goal: 0 Fentanyl drip per [facility]AD protocol   FAMILY  - Updates: Sister at bedside  - Inter-disciplinary family meet or Palliative Care meeting due by:  day 7, this is his third intubation the last 2  years    TODAY'S SUMMARY: Seems to have vascular access issues, leading to inadequate home dialysis and acute presentation with volume overload acidosis and hyperkalemia requiring emergent dialysis. We'll need to address access issue before discharge   The patient is critically ill with multiple organ systems failure and requires high complexity decision making for assessment and support, frequent evaluation and titration of therapies, application of advanced monitoring technologies and extensive interpretation of multiple databases. Critical Care Time devoted to patient care services described in this note independent of APP time is 45 minutes.     Cyril Mourning MD. Tonny Bollman. [place] [facility] Pager [phone] If no response call [phone]    02/02/2015, 8:04 AM

## 2015-02-02 NOTE — Procedures (Signed)
INTUBATION PROCEDURE NOTE  Indication: Acute hypercapnic respiratory failure Consent: emergent Time Out: yes Medications: Etomidate, rocuronium, versed, fentanyl Paralytic/RSI: yes Technique: Glide scope Blade 4 - Grade 2 view.     Bags well with PEEP valve @ 20 Blade: Glide scope 4 Grade 2.  MAC 4 Grade 3 view.  Distorted epiglottis anatomy. Cords Visualized: yes View: Grade 2 with glide scope # of attempts: 2 Tube confirmation:   Chest rise: yes  Bilateral Breath Sounds: yes  Color change on CO2 detector: yes  ETCO2: yes  CXR: Ordered Successful placement: yes    Galvin Proffer, DO., MS [place] [facility]

## 2015-02-02 NOTE — Procedures (Signed)
Central Venous HD Catheter Insertion Procedure Note Gary Rivera [id number] 09-Mar-1961  Procedure: Insertion of Central Venous HD Catheter Indications: emergent HD  Procedure Details Consent: Risks of procedure as well as the alternatives and risks of each were explained to the (patient/caregiver).  Consent for procedure obtained. Time Out: Verified patient identification, verified procedure, site/side was marked, verified correct patient position, special equipment/implants available, medications/allergies/relevent history reviewed, required imaging and test results available.  Performed Real time [place] was used to ID and cannulate the vessel  Maximum sterile technique was used including antiseptics, cap, gloves, gown, hand hygiene, mask and sheet. Skin prep: Chlorhexidine; local anesthetic administered A antimicrobial bonded/coated triple lumen catheter was placed in the left femoral vein due to patient being a dialysis patient and emergent situation using the Seldinger technique.  Evaluation Blood flow good Complications: No apparent complications Patient did tolerate procedure well. Chest X-ray ordered to verify placement.  CXR: n/a .  Shelby Mattocks 02/02/2015, 9:11 AM

## 2015-02-02 NOTE — Progress Notes (Signed)
Pt on NIV at this time. Stable at this time. Family member at bedside

## 2015-02-02 NOTE — Care Management Note (Signed)
Case Management Note  Patient Details  Name: Gary Rivera MRN: [id number] Date of Birth: 26-Nov-1960  Subjective/Objective:    Adm w resp failure                Action/Plan: lives at home   Expected Discharge Date:                  Expected Discharge Plan:     In-House Referral:     Discharge planning Services     Post Acute Care Choice:    Choice offered to:     DME Arranged:    DME Agency:     HH Arranged:    HH Agency:     Status of Service:     Medicare Important Message Given:    Date Medicare IM Given:    Medicare IM give by:    Date Additional Medicare IM Given:    Additional Medicare Important Message give by:     If discussed at Long Length of Stay Meetings, dates discussed:    Additional Comments: ur review done  Hanley Hays, RN 02/02/2015, 8:10 AM

## 2015-02-02 NOTE — Progress Notes (Signed)
eLink Physician-Brief Progress Note Patient Name: Gary Rivera DOB: 05-28-1960 MRN: [id number]   Date of Service  02/02/2015  HPI/Events of Note  approx 445 am , we camera in  Noted acidosis, need HD, hyperK  I called [facility], they were contacting renal again for consideration HD  eICU Interventions  Now note, worsening ph, k still up, more lethargic intermittent, but still follows commands and is witting on paper to RN HD now arrived to room Have adjusted NIMV to increase MV Sending pccm to bedside NOW to assess need ett     Intervention Category Major Interventions: Acid-Base disturbance - evaluation and management  FEINSTEIN,DANIEL J. 02/02/2015, 6:22 AM

## 2015-02-03 ENCOUNTER — Inpatient Hospital Stay (HOSPITAL_COMMUNITY): Payer: Medicare Other

## 2015-02-03 DIAGNOSIS — I10 Essential (primary) hypertension: Secondary | ICD-10-CM

## 2015-02-03 DIAGNOSIS — E8779 Other fluid overload: Secondary | ICD-10-CM

## 2015-02-03 LAB — BASIC METABOLIC PANEL
ANION GAP: 20 — AB (ref 5–15)
BUN: 106 mg/dL — ABNORMAL HIGH (ref 6–20)
CALCIUM: 8.1 mg/dL — AB (ref 8.9–10.3)
CHLORIDE: 99 mmol/L — AB (ref 101–111)
CO2: 23 mmol/L (ref 22–32)
CREATININE: 19.64 mg/dL — AB (ref 0.61–1.24)
GFR calc non Af Amer: 2 mL/min — ABNORMAL LOW (ref >60)
GFR, EST AFRICAN AMERICAN: 3 mL/min — AB (ref >60)
Glucose, Bld: 97 mg/dL (ref 65–99)
Potassium: 3.5 mmol/L (ref 3.5–5.1)
SODIUM: 142 mmol/L (ref 135–145)

## 2015-02-03 LAB — CBC
HCT: 33.2 % — ABNORMAL LOW (ref 39.0–52.0)
HEMOGLOBIN: 10.4 g/dL — AB (ref 13.0–17.0)
MCH: 28.3 pg (ref 26.0–34.0)
MCHC: 31.3 g/dL (ref 30.0–36.0)
MCV: 90.2 fL (ref 78.0–100.0)
PLATELETS: 144 10*3/uL — AB (ref 150–400)
RBC: 3.68 MIL/uL — AB (ref 4.22–5.81)
RDW: 16.8 % — ABNORMAL HIGH (ref 11.5–15.5)
WBC: 7.4 10*3/uL (ref 4.0–10.5)

## 2015-02-03 MED ORDER — ONDANSETRON HCL 4 MG/2ML IJ SOLN
4.0000 mg | INTRAMUSCULAR | Status: DC | PRN
  Administered 2015-02-03: 4 mg via INTRAVENOUS
  Filled 2015-02-03 (×2): qty 2

## 2015-02-03 NOTE — Procedures (Signed)
Extubation Procedure Note  Patient Details:   Name: ZANDON PINELL DOB: November 23, 1960 MRN: [id number]   Airway Documentation:     Evaluation  O2 sats: stable throughout Complications: No apparent complications Patient did tolerate procedure well. Bilateral Breath Sounds: Clear, Diminished Suctioning: Oral, Airway Yes, Pt. had (+) cuff leak, able to pull 2.2(L)for FVC, NIF-(-20)cmH20, lift/hold head off bed, no Stridor noted s/p, RT to monitor. Joylene John 02/03/2015, 12:17 PM

## 2015-02-03 NOTE — Clinical Documentation Improvement (Signed)
CCM or Nephrology  (please document your query response in the progress notes and discharge summary, not on the BPA form itself.)  "CHF" is documented in the current medical record.  Please document the ACUITY and TYPE of CHF in the progress notes and discharge summary.  Please include any historical or baseline information to support the diagnosis.  Please exercise your independent, professional judgment when responding. A specific answer is not anticipated or expected.   Thank You,  Jerral Ralph  RN BSN [facility]S [phone] Health Information Management [place]

## 2015-02-03 NOTE — Progress Notes (Signed)
   02/03/15 1000  Clinical Encounter Type  Visited With Patient and family together  Visit Type Spiritual support;Other (Comment)  Referral From Chaplain (family support)  Spiritual Encounters  Spiritual Needs Emotional  Chaplain visited with Pt and family together; Family member said Pt seems to be improving and that this was good news; Chaplain can return as needed

## 2015-02-03 NOTE — Progress Notes (Signed)
Patient feeling short of breath this afternoon after being extubated. Patient sats 95-100% on 4L nasal cannula, RR 18, some wheezing noted. Hemodialysis nurse at bedside and initiating HD treatment. RT notified and Rutherford Guys, PA at bedside to see patient. Shortly after HD started pt states breathing "feels worse, not getting better".   Orders for Bipap PRN. Will continue to monitor.

## 2015-02-03 NOTE — Progress Notes (Signed)
RT note: Pt. asked to be taken off, Bipap via Servo-I, more alert with >'d aeration b/l b.s., less Stridor upper airway, mild initially, RT to monitor.

## 2015-02-03 NOTE — Progress Notes (Signed)
eLink Physician-Brief Progress Note Patient Name: Gary Rivera DOB: [birth year] MRN: [id number]   Date of Service  02/03/2015  HPI/Events of Note    eICU Interventions       Intervention Category Minor Interventions: Routine modifications to care plan (e.g. PRN medications for pain, fever)  Nelda Bucks. 02/03/2015, 1:11 AM

## 2015-02-03 NOTE — Progress Notes (Signed)
PULMONARY / CRITICAL CARE MEDICINE   Name: Gary Rivera MRN: [id number] DOB: [birth year]    ADMISSION DATE:  02/01/2015 CONSULTATION DATE:  02/03/2015  REFERRING MD :  [facility]  CHIEF COMPLAINT:  resp distress   INITIAL PRESENTATION: 54 year old. End-stage renal disease on home dialysis with access issues, emergently intubated 11/14 for volume overload, acidosis and hyperkalemia and required emergent dialysis  STUDIES:  Echo 04/2014-EF 45%, basal akinesis Port CXR 11/15 mild cardiomegaly with trace left pleural effusion   SIGNIFICANT EVENTS: 11/14 - Admitted & intubated. Underwent HD. 11/15 - Extubated  HISTORY OF PRESENT ILLNESS:  He has end-stage renal disease on hemodialysis for 3 years. Failed peritoneal dialysis and was started on hemodialysis-finally went to home hemodialysis. Had access issues. Left arm AV fistula has been giving intermittent problems. Per sister who helps him with dialysis, last session was 3 days ago-she does not remember how much fluid was taken off or what his baseline weight is. He was admitted on 11/13 with increasing dyspnea-noted to be hyperkalemic to 7.1 with a creatinine of 27 and BUN of 164. Chest x-ray showed congestion, was placed on BiPAP Was intubated earlier for increasing respiratory acidosis on BiPAP  SUBJECTIVE: Patient able to write questions. Denies any chest pain or pressure. Denies any difficulty breathing on pressure support 0/5. Patient successfully underwent hemodialysis yesterday with removal 5+ liters.  REVIEW OF SYSTEMS:  Questionable review of systems as the patient is intubated. He does ask questions appropriately. Denies any abdominal pain but did have nausea with emesis this morning. Denies any fever or chills.  VITAL SIGNS: Temp:  [98 F (36.7 C)-99.1 F (37.3 C)] 98 F (36.7 C) (11/15 1400) Pulse Rate:  [45-106] 82 (11/15 1730) Resp:  [15-28] 18 (11/15 1730) BP: (79-172)/(51-141) 83/60 mmHg (11/15 1730) SpO2:  [93 %-100 %]  95 % (11/15 1749) FiO2 (%):  [30 %-40 %] 40 % (11/15 1600) Weight:  [316 lb 2.2 oz (143.4 kg)-316 lb 12.8 oz (143.7 kg)] 316 lb 2.2 oz (143.4 kg) (11/15 1400) HEMODYNAMICS:   VENTILATOR SETTINGS: Vent Mode:  [-] BIPAP FiO2 (%):  [30 %-40 %] 40 % Set Rate:  [20 bmp-26 bmp] 20 bmp Vt Set:  [600 mL] 600 mL PEEP:  [8 cmH20-10 cmH20] 8 cmH20 Pressure Support:  [5 cmH20] 5 cmH20 Plateau Pressure:  [23 cmH20-28 cmH20] 26 cmH20 INTAKE / OUTPUT:  Intake/Output Summary (Last 24 hours) at 02/03/15 1750 Last data filed at 02/03/15 1600  Gross per 24 hour  Intake 874.09 ml  Output      0 ml  Net 874.09 ml    PHYSICAL EXAMINATION: General:  Awake. Alert. No acute distress. Sitting up with family at bedside.  Integument:  Warm & dry. No rash on exposed skin.  HEENT: No scleral injection or icterus. Endotracheal tube in place. PERRL. Cardiovascular:  Regular rate. No edema.  Pulmonary:  Slightly diminished breath sounds bilateral lung bases. Symmetric chest wall expansion on ventilator. No accessory muscle use on pressure support trial 0/5. Abdomen: Soft. Normal bowel sounds. Protuberant. Grossly nontender. Neurological: Moving all 4 extremities equally. Patient writing questions on dry erase board. Grossly nonfocal.   LABS:  CBC  Recent Labs Lab 02/02/15 0035  02/02/15 1205 02/02/15 1309 02/03/15 0400  WBC 8.4  --   --   --  7.4  HGB 10.6*  < > 12.9* 15.0 10.4*  HCT 36.3*  < > 38.0* 44.0 33.2*  PLT 155  --   --   --  144*  < > =  values in this interval not displayed. Coag's No results for input(s): APTT, INR in the last 168 hours. BMET  Recent Labs Lab 02/02/15 0035  02/02/15 1015  02/02/15 1205 02/02/15 1309 02/03/15 0400  NA 145  < > 146*  < > 144 144 142  K 7.1*  < > >7.5*  < > 4.0 3.8 3.5  CL 104  < > 105  < > 106 103 99*  CO2 19*  --  18*  --   --   --  23  BUN 164*  < > 175*  < > 99* 87* 106*  CREATININE 27.07*  < > 26.42*  < > 17.00* 16.00* 19.64*  GLUCOSE 118*   < > 137*  < > 118* 130* 97  < > = values in this interval not displayed. Electrolytes  Recent Labs Lab 02/02/15 0035 02/02/15 1015 02/03/15 0400  CALCIUM 6.6* 6.7* 8.1*   Sepsis Markers No results for input(s): LATICACIDVEN, PROCALCITON, O2SATVEN in the last 168 hours. ABG  Recent Labs Lab 02/02/15 0443 02/02/15 0605  PHART 7.167* 7.054*  PCO2ART 52.8* 80.3*  PO2ART 55.0* 52.0*   Liver Enzymes  Recent Labs Lab 02/02/15 0035  AST 16  ALT 12*  ALKPHOS 65  BILITOT 0.5  ALBUMIN 3.8   Cardiac Enzymes No results for input(s): TROPONINI, PROBNP in the last 168 hours. Glucose No results for input(s): GLUCAP in the last 168 hours.  Imaging Dg Chest Port 1 View  02/03/2015  CLINICAL DATA:  Acute respiratory failure with hypoxemia, end-stage renal disease on dialysis, chronic CHF. , intubated patient. EXAM: PORTABLE CHEST 1 VIEW COMPARISON:  Portable chest x-ray of February 02, 2015 FINDINGS: The lungs are adequately inflated. The interstitial markings are mildly increased. The central pulmonary vascularity remains engorged. The cardiac silhouette remains enlarged. The retrocardiac region remains dense. The endotracheal tube tip lies 5.8 cm above the carina. The esophagogastric tube tip projects below the inferior margin of the image. IMPRESSION: Stable mild CHF and with cardiomegaly. Left lower lobe atelectasis and trace left pleural effusion are suspected. The support tubes are in reasonable position. Electronically Signed   By: David  [place] M.D.   On: 02/03/2015 07:23     ASSESSMENT / PLAN:  PULMONARY OETT 11/14 - 11/15 A:  Acute Hypercarbic & Hypoxic Respiratory Failure - Significant improvement. No obstruction noted on PFTs 2013  P:   Extubated today  Pulmonary toilette  Albuterol nebulized when necessary  CARDIOVASCULAR CVL HD catheter 11/14>>> A:  Acute pulmonary edema due to volume overload - Improving on HD.  P:  Monitor on telemetry Continue diuresis  with hemodialysis  RENAL A:   ESRD on hemodialysis Nonfunctioning fistula - S/P HD Catheter Placement. Hyperkalemia - Resolved. Metabolic acidosis - Resolving with HD  P:   Nephrology following Monitoring electrolytes daily Defer to nephrology on continuing Renvela versus switching to alternative agent  GASTROINTESTINAL A:   Nausea with emesis this morning - Secondary to Revela.  P:   Nothing by mouth for now Zofran IV when necessary Protonix daily Continuing Reglan  HEMATOLOGIC A:   Anemia of chronic disease - no obvious source of active bleeding.  P:  Trend hemoglobin daily with CBC Transfuse for hemoglobin less than 7 SCDs Heparin subcutaneous every 8 hours  INFECTIOUS A:   No acute issues  P:   Monitor for fever & leukocytosis  ENDOCRINE A:   No acute issues   P:   Monitor blood glucose with daily labs  NEUROLOGIC A:  Acute encephalopathy - Resolved.  P:   Discontinue fentanyl   FAMILY  - Updates: Family at bedside this morning and updated - Inter-disciplinary family meet or Palliative Care meeting due by:  day 7, this is his third intubation the last 2 years   TODAY'S SUMMARY: 55 year old male who was intubated yesterday for acute respiratory failure. Patient successfully completed a spontaneous breathing trial on pressure support 0/5 for approximately 1 hour today. Stable for extubation. Plan to undergo hemodialysis in the intensive care unit however given multiple medical problems and tenuous status.   I have spent a total of 33 minutes of critical care time today caring for the patient, plan and the patient's extubation with respiratory therapy, updating the pafamilyt bedside, discussing the plan of care with nephrology at bedside, & reviewing the patient's electronic medical record.   Donna Christen Jamison Neighbor, M.D. [facility] Pager:  [phone] After 3pm or if no response, call [phone] 02/03/2015, 5:50 PM

## 2015-02-03 NOTE — Progress Notes (Deleted)
Patient tachycardic 100-110s, increased ectopy - Non sustained SVT. Paged Dr. Delton See with cardiology. Orders for 10mg  metoprolol IVP. Will continue to monitor.

## 2015-02-03 NOTE — Progress Notes (Addendum)
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 There are no stents in this access, confirmed with Dr Juel Burrow      Assessment: 1 Resp failure/ pulm edema/ vol excess - improving, plan HD again today 2 ESRD on home HD 4d/ wk. Using temp cath now 3 AVF malfunction - has been having rising BUN/Cr in spite of usual dialysis, also very high pressures on machine which improved with PTA done last week 8mm to outflow tract at [facility], but then recurred according to patient on his last HD last Friday. Fistula was placed at [facility] in 2013 per patient. No stents in place per Dr Juel Burrow at [facility]  4 Hyperkalemia resolved 5 OHS/ obesity 6 Uremia - due to access malfxn most likely, much better, needs another HD today 7 DM 8 HTN cont meds  Plan - HD today using temp cath. Consult IR for fistulogram   [name] MD [facility] pager [phone]    cell [phone] 02/03/2015, 9:49 AM    Recent Labs Lab 02/02/15 0035  02/02/15 1015  02/02/15 1205 02/02/15 1309 02/03/15 0400  NA 145  < > 146*  < > 144 144 142  K 7.1*  < > >7.5*  < > 4.0 3.8 3.5  CL 104  < > 105  < > 106 103 99*  CO2 19*  --  18*  --   --   --  23  GLUCOSE 118*  < > 137*  < > 118* 130* 97  BUN 164*  < > 175*  < > 99* 87* 106*  CREATININE 27.07*  < > 26.42*  < > 17.00* 16.00* 19.64*  CALCIUM 6.6*  --  6.7*  --   --   --  8.1*  < > = values in  this interval not displayed.  Recent Labs Lab 02/02/15 0035  AST 16  ALT 12*  ALKPHOS 65  BILITOT 0.5  PROT 7.2  ALBUMIN 3.8    Recent Labs Lab 02/02/15 0035  02/02/15 1205 02/02/15 1309 02/03/15 0400  WBC 8.4  --   --   --  7.4  NEUTROABS 6.7  --   --   --   --   HGB 10.6*  < > 12.9* 15.0 10.4*  HCT 36.3*  < > 38.0* 44.0 33.2*  MCV 95.3  --   --   --  90.2  PLT 155  --   --   --  144*  < > = values in this interval not displayed. Marland Kitchen amitriptyline  25 mg Oral QHS  . antiseptic oral rinse  7 mL Mouth Rinse Q2H  . chlorhexidine  15 mL Mouth Rinse BID  . heparin  5,000 Units Subcutaneous 3 times per day  .  heparin  5,000 Units Dialysis Once in dialysis  . metoCLOPramide  10 mg Oral Daily  . multivitamin  1 tablet Oral QHS  . omega-3 acid ethyl esters  1 g Oral BID  . pantoprazole sodium  40 mg Per Tube Daily  . sevelamer carbonate  1,600 mg Oral TID WC  . vitamin E  400 Units Oral Daily   . fentaNYL infusion INTRAVENOUS 100 mcg/hr (02/03/15 0945)   sodium chloride, sodium chloride, albuterol, alteplase, clonazePAM, fentaNYL, heparin, heparin, lidocaine (PF), lidocaine-prilocaine, ondansetron (ZOFRAN) IV, pentafluoroprop-tetrafluoroeth

## 2015-02-04 ENCOUNTER — Inpatient Hospital Stay (HOSPITAL_COMMUNITY): Payer: Medicare Other

## 2015-02-04 DIAGNOSIS — D6489 Other specified anemias: Secondary | ICD-10-CM

## 2015-02-04 LAB — CBC WITH DIFFERENTIAL/PLATELET
BASOS PCT: 0 %
Basophils Absolute: 0 10*3/uL (ref 0.0–0.1)
EOS ABS: 0.1 10*3/uL (ref 0.0–0.7)
EOS PCT: 2 %
HCT: 37.6 % — ABNORMAL LOW (ref 39.0–52.0)
HEMOGLOBIN: 11.4 g/dL — AB (ref 13.0–17.0)
Lymphocytes Relative: 14 %
Lymphs Abs: 1 10*3/uL (ref 0.7–4.0)
MCH: 28.9 pg (ref 26.0–34.0)
MCHC: 30.3 g/dL (ref 30.0–36.0)
MCV: 95.2 fL (ref 78.0–100.0)
MONOS PCT: 15 %
Monocytes Absolute: 1.1 10*3/uL — ABNORMAL HIGH (ref 0.1–1.0)
NEUTROS PCT: 69 %
Neutro Abs: 5.2 10*3/uL (ref 1.7–7.7)
Platelets: ADEQUATE 10*3/uL (ref 150–400)
RBC: 3.95 MIL/uL — AB (ref 4.22–5.81)
RDW: 16.5 % — ABNORMAL HIGH (ref 11.5–15.5)
WBC: 7.4 10*3/uL (ref 4.0–10.5)

## 2015-02-04 LAB — RENAL FUNCTION PANEL
ALBUMIN: 3.4 g/dL — AB (ref 3.5–5.0)
Anion gap: 18 — ABNORMAL HIGH (ref 5–15)
BUN: 85 mg/dL — AB (ref 6–20)
CHLORIDE: 100 mmol/L — AB (ref 101–111)
CO2: 25 mmol/L (ref 22–32)
CREATININE: 19.09 mg/dL — AB (ref 0.61–1.24)
Calcium: 7.9 mg/dL — ABNORMAL LOW (ref 8.9–10.3)
GFR, EST AFRICAN AMERICAN: 3 mL/min — AB (ref >60)
GFR, EST NON AFRICAN AMERICAN: 2 mL/min — AB (ref >60)
Glucose, Bld: 79 mg/dL (ref 65–99)
PHOSPHORUS: 10.6 mg/dL — AB (ref 2.5–4.6)
Potassium: 4.4 mmol/L (ref 3.5–5.1)
Sodium: 143 mmol/L (ref 135–145)

## 2015-02-04 LAB — MAGNESIUM: MAGNESIUM: 2.2 mg/dL (ref 1.7–2.4)

## 2015-02-04 MED ORDER — PANTOPRAZOLE SODIUM 40 MG PO TBEC
40.0000 mg | DELAYED_RELEASE_TABLET | Freq: Every day | ORAL | Status: DC
  Administered 2015-02-04 – 2015-02-05 (×2): 40 mg via ORAL
  Filled 2015-02-04 (×2): qty 1

## 2015-02-04 MED ORDER — IOHEXOL 300 MG/ML  SOLN
100.0000 mL | Freq: Once | INTRAMUSCULAR | Status: DC | PRN
  Administered 2015-02-04: 58 mL via INTRAVENOUS
  Filled 2015-02-04: qty 100

## 2015-02-04 MED ORDER — LANTHANUM CARBONATE 500 MG PO CHEW: 2000.0000 mg | CHEWABLE_TABLET | ORAL | Status: DC | PRN

## 2015-02-04 MED ORDER — LANTHANUM CARBONATE 500 MG PO CHEW
2000.0000 mg | CHEWABLE_TABLET | Freq: Three times a day (TID) | ORAL | Status: DC
  Administered 2015-02-04 – 2015-02-05 (×5): 2000 mg via ORAL
  Filled 2015-02-04 (×5): qty 4

## 2015-02-04 NOTE — Progress Notes (Addendum)
[facility] Progress Note   Subjective: 2.7kg off w HD yesterday.  Had low BP's after HD through the night. Now BP's a little better about 110-130 systolic.   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 There are no stents in this access, confirmed with Dr Juel Burrow      Assessment: 1 Resp failure/ pulm edema/ vol excess - resolved, back to dry weight. Low SaO2 drops now are from OSA and not fluid 2 ESRD on home HD 4d/ wk. Using temp cath 3 AVF malfunction - has been having rising BUN/Cr in spite of usual dialysis, also very high pressures on machine which improved with PTA done last week 8mm to outflow tract at [facility], but then recurred according to patient on his last HD last Friday. Fistula was placed at [facility] in 2013 per patient. No stents in place per Dr Juel Burrow at [facility].  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 HD tomorrow.    [name] MD [facility] pager [phone]    cell [phone] 02/04/2015, 10:48 AM    Recent Labs Lab 02/02/15 1015  02/02/15 1309 02/03/15 0400 02/04/15 0520  NA 146*  < > 144 142 143  K >7.5*  < > 3.8 3.5 4.4  CL 105  < > 103 99* 100*  CO2 18*  --   --  23 25  GLUCOSE 137*  < > 130* 97  79  BUN 175*  < > 87* 106* 85*  CREATININE 26.42*  < > 16.00* 19.64* 19.09*  CALCIUM 6.7*  --   --  8.1* 7.9*  PHOS  --   --   --   --  10.6*  < > = values in this interval not displayed.  Recent Labs Lab 02/02/15 0035 02/04/15 0520  AST 16  --   ALT 12*  --   ALKPHOS 65  --   BILITOT 0.5  --   PROT 7.2  --   ALBUMIN 3.8 3.4*    Recent Labs Lab 02/02/15 0035  02/02/15 1309 02/03/15 0400 02/04/15 0520  WBC 8.4  --   --  7.4 7.4  NEUTROABS 6.7  --   --   --  5.2  HGB 10.6*  < > 15.0 10.4* 11.4*  HCT 36.3*  < > 44.0 33.2* 37.6*  MCV 95.3  --   --  90.2 95.2  PLT 155  --   --  144* PLATELET CLUMPS NOTED ON SMEAR, COUNT APPEARS ADEQUATE  < > = values in this interval not  displayed. Marland Kitchen amitriptyline  25 mg Oral QHS  . antiseptic oral rinse  7 mL Mouth Rinse Q2H  . chlorhexidine  15 mL Mouth Rinse BID  . heparin  5,000 Units Subcutaneous 3 times per day  . lanthanum  2,000 mg Oral TID WC  . metoCLOPramide  10 mg Oral Daily  . multivitamin  1 tablet Oral QHS  . omega-3 acid ethyl esters  1 g Oral BID  . pantoprazole sodium  40 mg Per Tube Daily  . vitamin E  400 Units Oral Daily     albuterol, clonazePAM, lanthanum, ondansetron (ZOFRAN) IV

## 2015-02-04 NOTE — Progress Notes (Signed)
Patient placed on BIPAP for WOB. Patient tolerating well sat 95%. Rt will continue to monitor.

## 2015-02-04 NOTE — Progress Notes (Signed)
Pt transitioned to aerosol mask with episodes of o2 sats decreasing to the 70's at pt request.  Pt placed back on Bipap. o2 sats remaining high 90's. Pt educated and informed of the need for Bipap at this time.  Will continue to monitor.

## 2015-02-04 NOTE — Progress Notes (Signed)
PULMONARY / CRITICAL CARE MEDICINE   Name: Gary ORTONmax MRN: [id number] DOB: [birth year]    ADMISSION DATE:  02/01/2015 CONSULTATION DATE:  02/04/2015  REFERRING MD :  [facility]  CHIEF COMPLAINT:  resp distress   INITIAL PRESENTATION: 54 year old. End-stage renal disease on home dialysis with access issues, emergently intubated 11/14 for volume overload, acidosis and hyperkalemia and required emergent dialysis  STUDIES:  Echo 04/2014-EF 45%, basal akinesis Port CXR 11/15 mild cardiomegaly with trace left pleural effusion   SIGNIFICANT EVENTS: 11/14 - Admitted & intubated. Underwent HD. 11/15 - Extubated  HISTORY OF PRESENT ILLNESS:  He has end-stage renal disease on hemodialysis for 3 years. Failed peritoneal dialysis and was started on hemodialysis-finally went to home hemodialysis. Had access issues. Left arm AV fistula has been giving intermittent problems. Per sister who helps him with dialysis, last session was 3 days ago-she does not remember how much fluid was taken off or what his baseline weight is. He was admitted on 11/13 with increasing dyspnea-noted to be hyperkalemic to 7.1 with a creatinine of 27 and BUN of 164. Chest x-ray showed congestion, was placed on BiPAP Was intubated earlier for increasing respiratory acidosis on BiPAP  SUBJECTIVE: Patient underwent hemodialysis yesterday again. Soft blood pressures with hemodialysis. He didn't require BiPAP support overnight briefly. Patient denies any chest pain or pressure currently. He denies any dyspnea or cough currently.  REVIEW OF SYSTEMS:  No abdominal pain. Patient did have nausea and vomiting with Renvela yesterday. No subjective fever, chills, or sweats.  VITAL SIGNS: Temp:  [97.9 F (36.6 C)-98.4 F (36.9 C)] 98.2 F (36.8 C) (11/16 0752) Pulse Rate:  [75-102] 75 (11/16 1000) Resp:  [15-24] 16 (11/16 1000) BP: (79-188)/(51-164) 117/64 mmHg (11/16 1000) SpO2:  [90 %-100 %] 98 % (11/16 1000) FiO2 (%):  [30 %-50  %] 50 % (11/16 0308) Weight:  [308 lb 13.8 oz (140.1 kg)-316 lb 2.2 oz (143.4 kg)] 308 lb 13.8 oz (140.1 kg) (11/16 0500) HEMODYNAMICS:   VENTILATOR SETTINGS: Vent Mode:  [-] BIPAP;PCV FiO2 (%):  [30 %-50 %] 50 % Set Rate:  [15 bmp-20 bmp] 15 bmp PEEP:  [8 cmH20] 8 cmH20 INTAKE / OUTPUT:  Intake/Output Summary (Last 24 hours) at 02/04/15 1158 Last data filed at 02/03/15 2100  Gross per 24 hour  Intake 110.17 ml  Output   2720 ml  Net -2609.83 ml    PHYSICAL EXAMINATION: General:  Awake. Alert. Sitting up in bed watching TV left femoral HD catheter in place..  Integument:  Warm & dry. No rash on exposed skin.  HEENT: No scleral injection or icterus. Moist mucous membranes. PERRL. Cardiovascular:  Regular rate. No edema. Unable to appreciate JVD given body habitus. Pulmonary:  Continue decreased breath sounds bilateral lung bases. Normal work of breathing on nasal cannula oxygen. Abdomen: Soft. Normal bowel sounds. Protuberant. Nontender. Neurological: Moving all 4 extremities equally. Nonfocal. Oriented to person, place, and time.   LABS:  CBC  Recent Labs Lab 02/02/15 0035  02/02/15 1309 02/03/15 0400 02/04/15 0520  WBC 8.4  --   --  7.4 7.4  HGB 10.6*  < > 15.0 10.4* 11.4*  HCT 36.3*  < > 44.0 33.2* 37.6*  PLT 155  --   --  144* PLATELET CLUMPS NOTED ON SMEAR, COUNT APPEARS ADEQUATE  < > = values in this interval not displayed. Coag's No results for input(s): APTT, INR in the last 168 hours. BMET  Recent Labs Lab 02/02/15 1015  02/02/15 1309 02/03/15 0400  02/04/15 0520  NA 146*  < > 144 142 143  K >7.5*  < > 3.8 3.5 4.4  CL 105  < > 103 99* 100*  CO2 18*  --   --  23 25  BUN 175*  < > 87* 106* 85*  CREATININE 26.42*  < > 16.00* 19.64* 19.09*  GLUCOSE 137*  < > 130* 97 79  < > = values in this interval not displayed. Electrolytes  Recent Labs Lab 02/02/15 1015 02/03/15 0400 02/04/15 0520  CALCIUM 6.7* 8.1* 7.9*  MG  --   --  2.2  PHOS  --   --   10.6*   Sepsis Markers No results for input(s): LATICACIDVEN, PROCALCITON, O2SATVEN in the last 168 hours. ABG  Recent Labs Lab 02/02/15 0443 02/02/15 0605  PHART 7.167* 7.054*  PCO2ART 52.8* 80.3*  PO2ART 55.0* 52.0*   Liver Enzymes  Recent Labs Lab 02/02/15 0035 02/04/15 0520  AST 16  --   ALT 12*  --   ALKPHOS 65  --   BILITOT 0.5  --   ALBUMIN 3.8 3.4*   Cardiac Enzymes No results for input(s): TROPONINI, PROBNP in the last 168 hours. Glucose No results for input(s): GLUCAP in the last 168 hours.  Imaging No results found.   ASSESSMENT / PLAN:  54 year old male with AV graft malfunction known end-stage renal disease on hemodialysis. Patient's hyperkalemia and hypertension have markedly improved after his 2 courses of hemodialysis. Given his body habitus I am highly suspicious that he could have underlying OSA/OHS. Patient has tolerated extubation well and is also tolerating diet at this time. As he continues to stabilize clinically I will transition him out of the intensive care unit to a stepdown bed.  1. Acute hypoxic respiratory failure: Resolved after hemodialysis due to flash pulmonary edema. 2. Acute hypercarbic respiratory failure: No evidence for COPD on PFTs from 2013. Suspect patient may have some element of obstructive sleep apnea. Continuing BiPAP as needed. Patient should have follow-up in our pulmonary clinic in evaluation for possible occult sleep apnea with outpatient polysomnogram. 3. Acute encephalopathy: Resolved. 4. Nonfunctioning AV graft: Patient plan for evaluation by radiology. Continuing left femoral temporary HD catheter for now. 5. End-stage renal disease: Currently on hemodialysis per nephrology. Plan for repeat session tomorrow. Monitoring electrolytes daily. Switched to [employer] given nausea with Renvela. 6. Hyperkalemia: Resolved with hemodialysis. 7. Metabolic acidosis: Resolved with hemodialysis. 8. Anemia: Patient has mild anemia of  chronic disease. No evidence of active blood loss. 9. Prophylaxis: Continue Protonix as patient is on Prilosec at home chronically. Continuing heparin subcutaneous every 8 hours & SCDs. 10. Diet: Advancing diet as tolerated to renal diet that his carbohydrate modified. 11. Disposition: Transferring patient to stepdown bed.  [facility] hospitalist to assume care on 11/17 & PCCM signing off.   Donna Christen Jamison Neighbor, M.D. [facility] Pager:  [phone] After 3pm or if no response, call [phone] 02/04/2015, 11:58 AM

## 2015-02-04 NOTE — Progress Notes (Signed)
RT with help of RN tried to place pt on BiPAP at this time, pt became combative and ripped mask off, RT will try to place pt on BiPAP again later in the evening

## 2015-02-04 NOTE — Progress Notes (Signed)
Patient requested to have BiPAP. When RN and RT arrived to place the BiPAP patient was in a deep sleep,  mouth breathing with O2 via [place] and sats in the 80's. Patient refused to have BiPAP applied, after requesting the biPAP and became combative. Patient then called his sister Kendal Hymen, who is on the contact list, reporting that "a bunch of nurses came in the room trying to place his BiPAP". Writer and another nurse went to speak with patient. Patient was able to answer all orientation question appropiately, however, patient judgment is still in questions.

## 2015-02-04 NOTE — Progress Notes (Signed)
Wasted Fentanyl gtt with Marcellus Scott, RN.  Wasted in [place].  Eliane Decree, RN

## 2015-02-05 DIAGNOSIS — J810 Acute pulmonary edema: Secondary | ICD-10-CM

## 2015-02-05 MED ORDER — LANTHANUM CARBONATE 1000 MG PO CHEW: 2000.0000 mg | CHEWABLE_TABLET | Freq: Three times a day (TID) | ORAL | Status: DC

## 2015-02-05 MED ORDER — PANTOPRAZOLE SODIUM 40 MG PO TBEC: 40.0000 mg | DELAYED_RELEASE_TABLET | Freq: Every day | ORAL | Status: DC

## 2015-02-05 NOTE — Care Management Important Message (Signed)
Important Message  Patient Details  Name: Gary Rivera MRN: [id number] Date of Birth: 1960-07-20   Medicare Important Message Given:  Yes    Stephana Morell Abena 02/05/2015, 11:35 AM

## 2015-02-05 NOTE — Progress Notes (Signed)
Left femoral HDC removed, vaseline gauze pressure dressing applied and manual pressure held x 30 minutes.  No bleeding post removal.  Pressure dressing remains in place. Rhetta Cleek, Lajean Manes, RN VAST

## 2015-02-05 NOTE — Progress Notes (Signed)
[facility] Progress Note   Subjective: BP's good, f-gram was clear, no obstruction  Filed Vitals:   02/04/15 2355 02/05/15 0400 02/05/15 0600 02/05/15 0750  BP: 120/80 110/67  110/67  Pulse: 88 81  87  Temp: 98 F (36.7 C) 98 F (36.7 C)  97.9 F (36.6 C)  TempSrc: Oral Oral  Oral  Resp: 23 20  22   Height:      Weight:   142.7 kg (314 lb 9.5 oz)   SpO2: 95% 97%  96%   Exam: Alert, on vent No jvd Chest clear bilat no rales or wheezes RRR no MRG ABd obese soft ntnd +bs 1+ LE edema LFA AVF +bruit Neuro alert, nf ox 3  Home HD > 4d/wk x 3.5h each 139kg Hep 5000 2/2.25 bath LFA AVF Had fistulogram last week at [facility] w 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 There are no stents in this access, confirmed with Dr Juel Burrow      Assessment: 1 Pulm edema - due to vol overload, resolved 2 ESRD on home HD 4d/ wk. Using temp cath 3 HD access - nothing wrong w AVF on fistulogram, ok to use. Fistula was placed at [facility] in 2013.  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 OK for discharge after short HD today.     [name] MD [facility] pager [phone]    cell [phone] 02/05/2015, 10:44 AM    Recent Labs Lab 02/02/15 1015  02/02/15 1309 02/03/15 0400 02/04/15 0520  NA 146*  < > 144 142 143  K >7.5*  < > 3.8 3.5 4.4  CL 105  < > 103 99* 100*  CO2 18*  --   --  23 25  GLUCOSE 137*  < > 130* 97 79  BUN 175*  < > 87* 106* 85*  CREATININE 26.42*  < > 16.00* 19.64* 19.09*  CALCIUM 6.7*  --   --  8.1* 7.9*  PHOS  --   --   --   --  10.6*  < > = values in this interval not displayed.  Recent Labs Lab 02/02/15 0035 02/04/15 0520  AST 16  --   ALT 12*  --   ALKPHOS 65  --   BILITOT 0.5  --   PROT 7.2  --   ALBUMIN 3.8 3.4*    Recent Labs Lab 02/02/15 0035  02/02/15 1309  02/03/15 0400 02/04/15 0520  WBC 8.4  --   --  7.4 7.4  NEUTROABS 6.7  --   --   --  5.2  HGB 10.6*  < > 15.0 10.4* 11.4*  HCT 36.3*  < > 44.0 33.2* 37.6*  MCV 95.3  --   --  90.2 95.2  PLT 155  --   --  144* PLATELET CLUMPS NOTED ON SMEAR, COUNT APPEARS ADEQUATE  < > = values in this interval not displayed. Marland Kitchen amitriptyline  25 mg Oral QHS  . heparin  5,000 Units Subcutaneous 3 times per day  . lanthanum  2,000 mg Oral TID WC  . metoCLOPramide  10 mg Oral Daily  . multivitamin  1 tablet Oral QHS  . omega-3 acid ethyl esters  1 g Oral BID  . pantoprazole  40 mg Oral Daily  . vitamin E  400 Units  Oral Daily     albuterol, clonazePAM, iohexol, lanthanum, ondansetron (ZOFRAN) IV

## 2015-02-05 NOTE — Discharge Summary (Signed)
Physician Discharge Summary  Gary Rivera ZOX:[id number] DOB: 06-26-1960 DOA: 02/01/2015  PCP: [name], MD  Admit date: 02/01/2015 Discharge date: 02/05/2015  Time spent: 40 minutes  Recommendations for Outpatient Follow-up:  Acute  respiratory failure with hypoxia and hypercarbia -Resolved after hemodialysis due to flash pulmonary edema. -Follow-up with peritoneal dialysis as directed by nephrology.  OSA/OHS -Patient requires appointment in 1-2 weeks with Dr. Sandrea Hughs for PFT, pre-/post bronchodilator with DLCO and sleep study  Acute Metabolic Encephalopathy: -Secondary to acute home dialysis failure resolved with HD.  Nonfunctioning AV graft: -Per nephrology AV graft was evaluated by vascular found to be patent, cleared for discharge following HD  Patient plan for evaluation by radiology. Continuing left femoral temporary HD catheter for now.  Chronic systolic CHF -Follow-up with Dr. [name], for acute respiratory failure with hypoxia and hypercarbia, and chronic systolic CHF. Recommend patient be referred to CHF clinic   Hyperkalemia:  -Resolved with hemodialysis.  Metabolic acidosis:  -Resolved with hemodialysis.  Anemia:  -Patient has mild anemia of chronic disease. No evidence of active blood loss     Discharge Diagnoses:  Principal Problem:   Fluid overload Active Problems:   HTN (hypertension)   Anemia   ESRD on hemodialysis (HCC)   Hyperkalemia   Acute respiratory failure with hypoxemia [facility])   Discharge Condition: Stable  Diet recommendation: Renal diet  Filed Weights   02/05/15 0600 02/05/15 1336 02/05/15 1744  Weight: 142.7 kg (314 lb 9.5 oz) 143.3 kg (315 lb 14.7 oz) 142.1 kg (313 lb 4.4 oz)    History of present illness:  54 year old. BM PMHx ESRD End-stage renal disease on home dialysis with access issues, emergently intubated 11/14 for volume overload, acidosis and hyperkalemia and required emergent dialysis  He has  end-stage renal disease on hemodialysis for 3 years. Failed peritoneal dialysis and was started on hemodialysis-finally went to home hemodialysis. Had access issues. Left arm AV fistula has been giving intermittent problems. Per sister who helps him with dialysis, last session was 3 days ago-she does not remember how much fluid was taken off or what his baseline weight is. He was admitted on 11/13 with increasing dyspnea-noted to be hyperkalemic to 7.1 with a creatinine of 27 and BUN of 164. Chest x-ray showed congestion, was placed on BiPAP Was intubated earlier for increasing respiratory acidosis on BiPAP During his hospitalization patient was treated for flash pulmonary edema which resolved after hemodialysis. Patient has body habitus which would be consistent with OSA/OHS and will require sleep study after discharge.    Procedures: 2/29 Echo; - Left ventricle: moderate LVH.-LVEF=45% to 50%. Akinesis of the basalanteroseptal myocardium. - (grade 1 diastolic dysfunction).  Consultations: Dr.Jennings Harlon Ditty [facility] M      Discharge Exam: Filed Vitals:   02/05/15 1707 02/05/15 1722 02/05/15 1737 02/05/15 1744  BP: 98/56 113/56 85/50 100/62  Pulse: 81 81 80 78  Temp:    98 F (36.7 C)  TempSrc:    Oral  Resp:    21  Height:      Weight:    142.1 kg (313 lb 4.4 oz)  SpO2:        General: Morbidly obese, A/O 4, NAD Cardiovascular: Regular rhythm and rate, negative murmurs rubs gallops, normal S1/S2 Respiratory: Clear to auscultation bilateral  Discharge Instructions     Medication List    STOP taking these medications        sevelamer carbonate 800 MG tablet  Commonly known as:  RENVELA  TAKE these medications        albuterol 108 (90 BASE) MCG/ACT inhaler  Commonly known as:  PROVENTIL HFA;VENTOLIN HFA  Inhale 1 puff into the lungs every 6 (six) hours as needed for wheezing or shortness of breath.     amitriptyline 25 MG tablet  Commonly known as:  ELAVIL  Take  25 mg by mouth at bedtime.     clonazePAM 0.5 MG tablet  Commonly known as:  KLONOPIN  Take 1 mg by mouth 2 (two) times daily as needed for anxiety.     lanthanum 1000 MG chewable tablet  Commonly known as:  FOSRENOL  Chew 2 tablets (2,000 mg total) by mouth 3 (three) times daily with meals.     metoCLOPramide 10 MG tablet  Commonly known as:  REGLAN  Take 10 mg by mouth daily.     multivitamin Tabs tablet  Take 1 tablet by mouth at bedtime.     Omega-3 1000 MG Caps  Take 1 g by mouth 2 (two) times daily.     omeprazole 10 MG capsule  Commonly known as:  PRILOSEC  Take 10 mg by mouth daily.     Oxycodone HCl 10 MG Tabs  Take 1 tablet (10 mg total) by mouth 3 (three) times daily as needed.     pantoprazole 40 MG tablet  Commonly known as:  PROTONIX  Take 1 tablet (40 mg total) by mouth daily.     vitamin E 400 UNIT capsule  Take 400 Units by mouth daily.       Allergies  Allergen Reactions  . Renvela [Sevelamer] Nausea And Vomiting   Follow-up Information    Schedule an appointment as soon as possible for a visit with Sandrea Hughs, MD.   Specialty:  Pulmonary Disease   Why:  Patient requires appointment in 1-2 weeks for PFT, pre-/post bronchodilator with DLCO and sleep study   Contact information:   [address] [place] [address] [phone]       Follow up with [name], MD.   Specialty:  Internal Medicine   Why:  Patient requires appointment with Dr. [name], for acute respiratory failure with hypoxia and hypercarbia   Contact information:   [address] [place] [address] [phone]        The results of significant diagnostics from this hospitalization (including imaging, microbiology, ancillary and laboratory) are listed below for reference.    Significant Diagnostic Studies: Dg Chest Port 1 View  02/03/2015  CLINICAL DATA:  Acute respiratory failure with hypoxemia, end-stage renal disease on dialysis, chronic CHF. ,  intubated patient. EXAM: PORTABLE CHEST 1 VIEW COMPARISON:  Portable chest x-ray of February 02, 2015 FINDINGS: The lungs are adequately inflated. The interstitial markings are mildly increased. The central pulmonary vascularity remains engorged. The cardiac silhouette remains enlarged. The retrocardiac region remains dense. The endotracheal tube tip lies 5.8 cm above the carina. The esophagogastric tube tip projects below the inferior margin of the image. IMPRESSION: Stable mild CHF and with cardiomegaly. Left lower lobe atelectasis and trace left pleural effusion are suspected. The support tubes are in reasonable position. Electronically Signed   By: David  [place] M.D.   On: 02/03/2015 07:23   Dg Chest Port 1 View  02/02/2015  CLINICAL DATA:  Acute respiratory failure. EXAM: PORTABLE CHEST 1 VIEW COMPARISON:  02/01/2015 and 06/26/2014 FINDINGS: Endotracheal tube and NG tube appear in good position. Chronic cardiomegaly. Slight residual pulmonary vascular congestion, improved. No infiltrates or  effusions. IMPRESSION: Improved pulmonary vascular congestion.  Chronic cardiomegaly. Electronically Signed   By: Francene Boyers M.D.   On: 02/02/2015 07:51   Dg Chest Port 1 View  02/02/2015  CLINICAL DATA:  Patient on hormone dialysis. Acute onset of shortness of breath, with rhonchi. Initial encounter. EXAM: PORTABLE CHEST 1 VIEW COMPARISON:  Chest radiograph performed 06/26/2014 FINDINGS: The lungs are hypoexpanded. Vascular congestion is noted, with increased interstitial markings, concerning for mild pulmonary edema. A small left pleural effusion is noted. No pneumothorax is seen. The cardiomediastinal silhouette is borderline normal in size. No acute osseous abnormalities are seen. IMPRESSION: Lungs hypoexpanded. Vascular congestion, with increased interstitial markings, concerning for mild pulmonary edema. Small left pleural effusion noted. Electronically Signed   By: Roanna Raider M.D.   On: 02/02/2015  00:05   Ir Shuntogram/ Fistulagram Left Mod Sed  02/04/2015  CLINICAL DATA:  Dialysis fistula malfunction. Difficult cannulization. Reportedly, the patient underwent recent balloon angioplasty. EXAM: LEFT UPPER EXTREMITY FISTULOGRAM Physician: Rachelle Hora. Henn, MD FLUOROSCOPY TIME:  1 minutes and 18 seconds.  306.1 mGy MEDICATIONS AND MEDICAL HISTORY: None ANESTHESIA/SEDATION: Moderate sedation time: None CONTRAST:  58 mL Omnipaque 300 PROCEDURE: The left forearm fistula was accessed with an Angiocath. A series of fistulogram images were obtained. The catheter was removed with manual compression. Bandage placed over the puncture site. FINDINGS: There is a distal forearm cephalic vein fistula. The fistula is patent. 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 Mild irregularity and mild narrowing of the cephalic vein in the mid forearm. There is drainage through the cephalic and brachial venous system. Central veins are patent. Arterial anastomosis is patent. Estimated blood loss: Minimal IMPRESSION: Mild irregularity of the cephalic vein in the mid forearm but no significant stenosis. No intervention was performed. Electronically Signed   By: Richarda Overlie M.D.   On: 02/04/2015 17:11    Microbiology: Recent Results (from the past 240 hour(s))  MRSA PCR Screening     Status: None   Collection Time: 02/02/15  4:21 AM  Result Value Ref Range Status   MRSA by PCR NEGATIVE NEGATIVE Final    Comment:        The GeneXpert MRSA Assay (FDA approved for NASAL specimens only), is one component of a comprehensive MRSA colonization surveillance program. It is not intended to diagnose MRSA infection nor to guide or monitor treatment for MRSA infections.      Labs: Basic Metabolic Panel:  Recent Labs Lab 02/02/15 0035  02/02/15 1015 02/02/15 1058 02/02/15 1205 02/02/15 1309 02/03/15 0400 02/04/15 0520  NA 145  < > 146* 146* 144 144 142 143  K 7.1*  < > >7.5* 5.1 4.0 3.8 3.5 4.4  CL  104  < > 105 107 106 103 99* 100*  CO2 19*  --  18*  --   --   --  23 25  GLUCOSE 118*  < > 137* 130* 118* 130* 97 79  BUN 164*  < > 175* 129* 99* 87* 106* 85*  CREATININE 27.07*  < > 26.42* >18.00* 17.00* 16.00* 19.64* 19.09*  CALCIUM 6.6*  --  6.7*  --   --   --  8.1* 7.9*  MG  --   --   --   --   --   --   --  2.2  PHOS  --   --   --   --   --   --   --  10.6*  < > =  values in this interval not displayed. Liver Function Tests:  Recent Labs Lab 02/02/15 0035 02/04/15 0520  AST 16  --   ALT 12*  --   ALKPHOS 65  --   BILITOT 0.5  --   PROT 7.2  --   ALBUMIN 3.8 3.4*   No results for input(s): LIPASE, AMYLASE in the last 168 hours. No results for input(s): AMMONIA in the last 168 hours. CBC:  Recent Labs Lab 02/02/15 0035  02/02/15 1058 02/02/15 1205 02/02/15 1309 02/03/15 0400 02/04/15 0520  WBC 8.4  --   --   --   --  7.4 7.4  NEUTROABS 6.7  --   --   --   --   --  5.2  HGB 10.6*  < > 12.6* 12.9* 15.0 10.4* 11.4*  HCT 36.3*  < > 37.0* 38.0* 44.0 33.2* 37.6*  MCV 95.3  --   --   --   --  90.2 95.2  PLT 155  --   --   --   --  144* PLATELET CLUMPS NOTED ON SMEAR, COUNT APPEARS ADEQUATE  < > = values in this interval not displayed. Cardiac Enzymes: No results for input(s): CKTOTAL, CKMB, CKMBINDEX, TROPONINI in the last 168 hours. BNP: BNP (last 3 results)  Recent Labs  06/25/14 1317 02/02/15 0035  BNP 1060.8* 485.4*    ProBNP (last 3 results) No results for input(s): PROBNP in the last 8760 hours.  CBG: No results for input(s): GLUCAP in the last 168 hours.     Signed:  Carolyne Littles, MD [facility] [phone] pager

## 2015-02-05 NOTE — Progress Notes (Signed)
Pt clotted off system with one hour and twenty minutes of tx left.  Physician notified, blood returned, system relined and tx resumed.  Will continue to monitor.

## 2015-02-05 NOTE — Progress Notes (Signed)
Pt completed tx, assessment unchanged.   Needles removed and pressure held for 10 minutes.  Report called to bedside Rn  Goal of 4000 ultrafiltrated, net 3L off.

## 2015-02-05 NOTE — Progress Notes (Signed)
Pt arrived to unit by bed at 1336.  Report received from [place], [place].  Consent verified.  A & O X 4 Lungs coarse to auscultation, breathing easy on 4L [place] Regular R&R appreciated Generalized edema  LLAVF cannulated with 15 gauge needles, pulsation of blood noted.  Tx initiated at 1351 with goal of 4100 mL and net 3L.  Will continue to monitor.

## 2015-02-05 NOTE — Progress Notes (Signed)
Patient given discharge instructions. Patient instructed to pick up medications at pharmacy and make  follow up appts. Patient verbalized understanding. Patient d/c home with cell phone and personal belongings. Patient discharged home with sister.

## 2015-02-16 ENCOUNTER — Emergency Department (HOSPITAL_COMMUNITY): Payer: Medicare Other

## 2015-02-16 ENCOUNTER — Encounter (HOSPITAL_COMMUNITY): Payer: Self-pay | Admitting: Emergency Medicine

## 2015-02-16 ENCOUNTER — Inpatient Hospital Stay (HOSPITAL_COMMUNITY)
Admission: EM | Admit: 2015-02-16 | Discharge: 2015-02-19 | DRG: 252 | Disposition: A | Discharge status: Home / Self Care | Payer: Medicare Other | Attending: Internal Medicine | Admitting: Internal Medicine

## 2015-02-16 DIAGNOSIS — Z992 Dependence on renal dialysis: Secondary | ICD-10-CM | POA: Diagnosis not present

## 2015-02-16 DIAGNOSIS — I132 Hypertensive heart and chronic kidney disease with heart failure and with stage 5 chronic kidney disease, or end stage renal disease: Secondary | ICD-10-CM | POA: Diagnosis present

## 2015-02-16 DIAGNOSIS — D696 Thrombocytopenia, unspecified: Secondary | ICD-10-CM | POA: Diagnosis present

## 2015-02-16 DIAGNOSIS — N186 End stage renal disease: Secondary | ICD-10-CM | POA: Diagnosis present

## 2015-02-16 DIAGNOSIS — G253 Myoclonus: Secondary | ICD-10-CM

## 2015-02-16 DIAGNOSIS — F329 Major depressive disorder, single episode, unspecified: Secondary | ICD-10-CM | POA: Diagnosis present

## 2015-02-16 DIAGNOSIS — Z888 Allergy status to other drugs, medicaments and biological substances status: Secondary | ICD-10-CM | POA: Diagnosis not present

## 2015-02-16 DIAGNOSIS — D649 Anemia, unspecified: Secondary | ICD-10-CM | POA: Diagnosis present

## 2015-02-16 DIAGNOSIS — M898X9 Other specified disorders of bone, unspecified site: Secondary | ICD-10-CM | POA: Diagnosis present

## 2015-02-16 DIAGNOSIS — I429 Cardiomyopathy, unspecified: Secondary | ICD-10-CM | POA: Diagnosis present

## 2015-02-16 DIAGNOSIS — J810 Acute pulmonary edema: Secondary | ICD-10-CM | POA: Diagnosis not present

## 2015-02-16 DIAGNOSIS — R0600 Dyspnea, unspecified: Secondary | ICD-10-CM

## 2015-02-16 DIAGNOSIS — R0689 Other abnormalities of breathing: Secondary | ICD-10-CM

## 2015-02-16 DIAGNOSIS — Z79899 Other long term (current) drug therapy: Secondary | ICD-10-CM

## 2015-02-16 DIAGNOSIS — E872 Acidosis: Secondary | ICD-10-CM | POA: Diagnosis present

## 2015-02-16 DIAGNOSIS — N2581 Secondary hyperparathyroidism of renal origin: Secondary | ICD-10-CM | POA: Diagnosis present

## 2015-02-16 DIAGNOSIS — R410 Disorientation, unspecified: Secondary | ICD-10-CM

## 2015-02-16 DIAGNOSIS — I340 Nonrheumatic mitral (valve) insufficiency: Secondary | ICD-10-CM

## 2015-02-16 DIAGNOSIS — J811 Chronic pulmonary edema: Secondary | ICD-10-CM | POA: Diagnosis present

## 2015-02-16 DIAGNOSIS — I272 Other secondary pulmonary hypertension: Secondary | ICD-10-CM | POA: Diagnosis present

## 2015-02-16 DIAGNOSIS — Y832 Surgical operation with anastomosis, bypass or graft as the cause of abnormal reaction of the patient, or of later complication, without mention of misadventure at the time of the procedure: Secondary | ICD-10-CM | POA: Diagnosis present

## 2015-02-16 DIAGNOSIS — R0902 Hypoxemia: Secondary | ICD-10-CM

## 2015-02-16 DIAGNOSIS — I5042 Chronic combined systolic (congestive) and diastolic (congestive) heart failure: Secondary | ICD-10-CM | POA: Diagnosis present

## 2015-02-16 DIAGNOSIS — J9602 Acute respiratory failure with hypercapnia: Secondary | ICD-10-CM

## 2015-02-16 DIAGNOSIS — L910 Hypertrophic scar: Secondary | ICD-10-CM | POA: Diagnosis present

## 2015-02-16 DIAGNOSIS — I953 Hypotension of hemodialysis: Secondary | ICD-10-CM

## 2015-02-16 DIAGNOSIS — G4733 Obstructive sleep apnea (adult) (pediatric): Secondary | ICD-10-CM | POA: Diagnosis present

## 2015-02-16 DIAGNOSIS — R05 Cough: Secondary | ICD-10-CM

## 2015-02-16 DIAGNOSIS — Z6839 Body mass index (BMI) 39.0-39.9, adult: Secondary | ICD-10-CM | POA: Diagnosis not present

## 2015-02-16 DIAGNOSIS — I5022 Chronic systolic (congestive) heart failure: Secondary | ICD-10-CM | POA: Diagnosis not present

## 2015-02-16 DIAGNOSIS — E669 Obesity, unspecified: Secondary | ICD-10-CM | POA: Diagnosis present

## 2015-02-16 DIAGNOSIS — E875 Hyperkalemia: Secondary | ICD-10-CM | POA: Diagnosis present

## 2015-02-16 DIAGNOSIS — E8770 Fluid overload, unspecified: Secondary | ICD-10-CM | POA: Diagnosis present

## 2015-02-16 DIAGNOSIS — N185 Chronic kidney disease, stage 5: Secondary | ICD-10-CM | POA: Diagnosis not present

## 2015-02-16 DIAGNOSIS — R059 Cough, unspecified: Secondary | ICD-10-CM

## 2015-02-16 DIAGNOSIS — G8929 Other chronic pain: Secondary | ICD-10-CM | POA: Diagnosis present

## 2015-02-16 DIAGNOSIS — I517 Cardiomegaly: Secondary | ICD-10-CM

## 2015-02-16 DIAGNOSIS — R748 Abnormal levels of other serum enzymes: Secondary | ICD-10-CM

## 2015-02-16 DIAGNOSIS — F411 Generalized anxiety disorder: Secondary | ICD-10-CM | POA: Diagnosis present

## 2015-02-16 DIAGNOSIS — J9601 Acute respiratory failure with hypoxia: Secondary | ICD-10-CM | POA: Diagnosis present

## 2015-02-16 DIAGNOSIS — Z419 Encounter for procedure for purposes other than remedying health state, unspecified: Secondary | ICD-10-CM

## 2015-02-16 DIAGNOSIS — T82818A Embolism of vascular prosthetic devices, implants and grafts, initial encounter: Secondary | ICD-10-CM | POA: Diagnosis present

## 2015-02-16 DIAGNOSIS — J383 Other diseases of vocal cords: Secondary | ICD-10-CM

## 2015-02-16 DIAGNOSIS — I428 Other cardiomyopathies: Secondary | ICD-10-CM

## 2015-02-16 DIAGNOSIS — I4892 Unspecified atrial flutter: Secondary | ICD-10-CM

## 2015-02-16 DIAGNOSIS — D500 Iron deficiency anemia secondary to blood loss (chronic): Secondary | ICD-10-CM | POA: Diagnosis not present

## 2015-02-16 DIAGNOSIS — Z9889 Other specified postprocedural states: Secondary | ICD-10-CM

## 2015-02-16 DIAGNOSIS — D6489 Other specified anemias: Secondary | ICD-10-CM

## 2015-02-16 DIAGNOSIS — E662 Morbid (severe) obesity with alveolar hypoventilation: Secondary | ICD-10-CM | POA: Diagnosis present

## 2015-02-16 LAB — BASIC METABOLIC PANEL
Anion gap: 22 — ABNORMAL HIGH (ref 5–15)
BUN: 145 mg/dL — AB (ref 6–20)
CALCIUM: 6.6 mg/dL — AB (ref 8.9–10.3)
CO2: 19 mmol/L — ABNORMAL LOW (ref 22–32)
Chloride: 102 mmol/L (ref 101–111)
Creatinine, Ser: 30.48 mg/dL — ABNORMAL HIGH (ref 0.61–1.24)
GFR calc Af Amer: 2 mL/min — ABNORMAL LOW (ref >60)
GFR, EST NON AFRICAN AMERICAN: 1 mL/min — AB (ref >60)
GLUCOSE: 116 mg/dL — AB (ref 65–99)
POTASSIUM: 6 mmol/L — AB (ref 3.5–5.1)
Sodium: 143 mmol/L (ref 135–145)

## 2015-02-16 LAB — CBC WITH DIFFERENTIAL/PLATELET
BASOS ABS: 0 10*3/uL (ref 0.0–0.1)
BASOS PCT: 0 %
EOS PCT: 2 %
Eosinophils Absolute: 0.2 10*3/uL (ref 0.0–0.7)
HCT: 34.9 % — ABNORMAL LOW (ref 39.0–52.0)
Hemoglobin: 10.6 g/dL — ABNORMAL LOW (ref 13.0–17.0)
LYMPHS PCT: 19 %
Lymphs Abs: 1.5 10*3/uL (ref 0.7–4.0)
MCH: 29.3 pg (ref 26.0–34.0)
MCHC: 30.4 g/dL (ref 30.0–36.0)
MCV: 96.4 fL (ref 78.0–100.0)
MONO ABS: 0.5 10*3/uL (ref 0.1–1.0)
Monocytes Relative: 6 %
NEUTROS ABS: 5.8 10*3/uL (ref 1.7–7.7)
Neutrophils Relative %: 73 %
PLATELETS: 124 10*3/uL — AB (ref 150–400)
RBC: 3.62 MIL/uL — ABNORMAL LOW (ref 4.22–5.81)
RDW: 16.1 % — AB (ref 11.5–15.5)
WBC: 8 10*3/uL (ref 4.0–10.5)

## 2015-02-16 LAB — RENAL FUNCTION PANEL
Albumin: 3.5 g/dL (ref 3.5–5.0)
Anion gap: 19 — ABNORMAL HIGH (ref 5–15)
BUN: 151 mg/dL — AB (ref 6–20)
CALCIUM: 6.4 mg/dL — AB (ref 8.9–10.3)
CHLORIDE: 102 mmol/L (ref 101–111)
CO2: 23 mmol/L (ref 22–32)
CREATININE: 30.96 mg/dL — AB (ref 0.61–1.24)
GFR, EST AFRICAN AMERICAN: 2 mL/min — AB (ref >60)
GFR, EST NON AFRICAN AMERICAN: 1 mL/min — AB (ref >60)
Glucose, Bld: 87 mg/dL (ref 65–99)
Phosphorus: 11.3 mg/dL — ABNORMAL HIGH (ref 2.5–4.6)
Potassium: 7.1 mmol/L (ref 3.5–5.1)
SODIUM: 144 mmol/L (ref 135–145)

## 2015-02-16 MED ORDER — PENTAFLUOROPROP-TETRAFLUOROETH EX AERO: 1.0000 "application " | INHALATION_SPRAY | CUTANEOUS | Status: DC | PRN

## 2015-02-16 MED ORDER — IPRATROPIUM-ALBUTEROL 0.5-2.5 (3) MG/3ML IN SOLN
3.0000 mL | Freq: Once | RESPIRATORY_TRACT | Status: AC
  Administered 2015-02-16: 3 mL via RESPIRATORY_TRACT
  Filled 2015-02-16: qty 3

## 2015-02-16 MED ORDER — METOCLOPRAMIDE HCL 10 MG PO TABS
10.0000 mg | ORAL_TABLET | Freq: Every day | ORAL | Status: DC
  Administered 2015-02-17 – 2015-02-19 (×3): 10 mg via ORAL
  Filled 2015-02-16 (×3): qty 1

## 2015-02-16 MED ORDER — HEPARIN SODIUM (PORCINE) 1000 UNIT/ML DIALYSIS: 2500.0000 [IU] | INTRAMUSCULAR | Status: DC | PRN

## 2015-02-16 MED ORDER — LIDOCAINE HCL (PF) 1 % IJ SOLN: 5.0000 mL | INTRAMUSCULAR | Status: DC | PRN

## 2015-02-16 MED ORDER — LANTHANUM CARBONATE 500 MG PO CHEW
2000.0000 mg | CHEWABLE_TABLET | Freq: Three times a day (TID) | ORAL | Status: DC
  Administered 2015-02-16 – 2015-02-19 (×4): 2000 mg via ORAL
  Filled 2015-02-16 (×6): qty 4

## 2015-02-16 MED ORDER — SODIUM CHLORIDE 0.9 % IV SOLN
100.0000 mL | INTRAVENOUS | Status: DC | PRN
  Administered 2015-02-18: 14:00:00 via INTRAVENOUS
  Administered 2015-02-18: 500 mL via INTRAVENOUS

## 2015-02-16 MED ORDER — SODIUM CHLORIDE 0.9 % IJ SOLN
3.0000 mL | Freq: Two times a day (BID) | INTRAMUSCULAR | Status: DC
  Administered 2015-02-16 – 2015-02-19 (×5): 3 mL via INTRAVENOUS

## 2015-02-16 MED ORDER — DEXTROSE 5 % IV SOLN
1.5000 g | INTRAVENOUS | Status: DC
  Filled 2015-02-16: qty 1.5

## 2015-02-16 MED ORDER — HEPARIN SODIUM (PORCINE) 1000 UNIT/ML DIALYSIS: 1000.0000 [IU] | INTRAMUSCULAR | Status: DC | PRN

## 2015-02-16 MED ORDER — OXYCODONE HCL 5 MG PO TABS
10.0000 mg | ORAL_TABLET | Freq: Three times a day (TID) | ORAL | Status: DC | PRN
  Administered 2015-02-16 – 2015-02-17 (×2): 10 mg via ORAL
  Filled 2015-02-16 (×2): qty 2

## 2015-02-16 MED ORDER — SODIUM CHLORIDE 0.9 % IV SOLN: 100.0000 mL | INTRAVENOUS | Status: DC | PRN

## 2015-02-16 MED ORDER — POTASSIUM CHLORIDE 20 MEQ PO PACK: 40.0000 meq | PACK | Freq: Two times a day (BID) | ORAL | Status: DC

## 2015-02-16 MED ORDER — HEPARIN SODIUM (PORCINE) 5000 UNIT/ML IJ SOLN
5000.0000 [IU] | Freq: Three times a day (TID) | INTRAMUSCULAR | Status: DC
  Administered 2015-02-16 – 2015-02-19 (×8): 5000 [IU] via SUBCUTANEOUS
  Filled 2015-02-16 (×8): qty 1

## 2015-02-16 MED ORDER — LIDOCAINE-PRILOCAINE 2.5-2.5 % EX CREA
1.0000 "application " | TOPICAL_CREAM | CUTANEOUS | Status: DC | PRN
  Filled 2015-02-16: qty 5

## 2015-02-16 MED ORDER — SODIUM POLYSTYRENE SULFONATE 15 GM/60ML PO SUSP
30.0000 g | Freq: Once | ORAL | Status: AC
  Administered 2015-02-16: 30 g via ORAL
  Filled 2015-02-16: qty 120

## 2015-02-16 MED ORDER — CLONAZEPAM 1 MG PO TABS: 1.0000 mg | ORAL_TABLET | Freq: Two times a day (BID) | ORAL | Status: DC | PRN

## 2015-02-16 MED ORDER — PANTOPRAZOLE SODIUM 40 MG PO TBEC
40.0000 mg | DELAYED_RELEASE_TABLET | Freq: Every day | ORAL | Status: DC
  Administered 2015-02-17 – 2015-02-19 (×3): 40 mg via ORAL
  Filled 2015-02-16 (×3): qty 1

## 2015-02-16 MED ORDER — AMITRIPTYLINE HCL 25 MG PO TABS
25.0000 mg | ORAL_TABLET | Freq: Every day | ORAL | Status: DC
  Administered 2015-02-16: 25 mg via ORAL
  Filled 2015-02-16 (×2): qty 1

## 2015-02-16 MED ORDER — SODIUM CHLORIDE 0.9 % IV SOLN
1.0000 g | Freq: Once | INTRAVENOUS | Status: AC
  Administered 2015-02-16: 1 g via INTRAVENOUS
  Filled 2015-02-16: qty 10

## 2015-02-16 MED ORDER — ALTEPLASE 2 MG IJ SOLR
2.0000 mg | Freq: Once | INTRAMUSCULAR | Status: DC | PRN
  Filled 2015-02-16: qty 2

## 2015-02-16 NOTE — Procedures (Signed)
Under sterile conditions and using US guidance, a 20 cm Trialysis 3-.lumen temporary dialysis catheter was placed in the R femoral vein w/o complications.  Indication- failed HD access, uremia/ K^^. Ready to use.   [name] MD [employer] pager [phone]    cell [phone] 02/16/2015, 12:46 PM

## 2015-02-16 NOTE — Procedures (Signed)
  I was present at this dialysis session, have reviewed the session itself and made  appropriate changes [name] MD [facility] pager [phone]    cell [phone] 02/16/2015, 12:43 PM

## 2015-02-16 NOTE — ED Notes (Signed)
No bruit felt in fistula in left lower arm. Pt having snoring type resp. With drooling, pt fammily member states that this is different than normal. Pt states " it feels like I need intubated." Dr. Lawernce Ion notified.

## 2015-02-16 NOTE — ED Notes (Signed)
Attempted to call report

## 2015-02-16 NOTE — ED Notes (Signed)
Per [facility]  C/o difficulty breathing. brochitis for a week, on meds and states he is staking it as prescribed. Approx 1 hr c/o of inc difficulty breathing. Home 02 at 2L.   Dialysis pt (left arm), last full  treatment Wednesday, pt does home dialysis hasn't been able to do it since then. He thinks it is infected and is closing up.   Lung sounds clear some expiratory wheezing, sounds audibly congested in his throat but denies coughing up any congestion.

## 2015-02-16 NOTE — ED Provider Notes (Signed)
CSN: [id number]   Arrival date & time 02/16/15 0223  History  By signing my name below, I, Gary Rivera, attest that this documentation has been prepared under the direction and in the presence of Gary Kaplan, MD. Electronically Signed: Bethel Rivera, ED Scribe. 02/16/2015. 4:32 AM.  No chief complaint on file.   HPI The history is provided by the patient. No language interpreter was used.   Gary Rivera is a 54 y.o. male with history of ESRD on dialysis, HTN, and CHF who presents to the Emergency Department complaining of swelling at the LUE at the site of his dialysis fistula with onset 5 days ago. Pt states that he usually does dialysis at home 4-5 times per week but has been unable to over the last 5 days because his fistula is blocked. His home nurse is aware of the problem and he is scheduled to see a vascular specialist in the morning but wanted to be evaluated tonight. Associated symptoms include wheezing. Pt denies SOB, fever, chest pain, nasal congestion, rhinorrhea, and LE swelling. He was recently discharged from the hospital where he was reportedly treated for bronchitis. Pt denies history of MI.   Past Medical History  Diagnosis Date  . ESRD on hemodialysis (HCC)     Started dialysis with PD in 2013 and switched to HD in spring 2014 because of fungal peritonitis.  Takes dialysis at home 5 days a week approx 3h 15 min each session.     . CHF (congestive heart failure) (HCC)   . Peritoneal dialysis catheter in place [facility])   . Anemia     patient reporats that last hgb 7.9 a week ago  . Anxiety state, unspecified   . Hypertension     Past Surgical History  Procedure Laterality Date  . Appendectomy    . Peritoneal catheter insertion    . Tee without cardioversion N/A 07/11/2013    Procedure: TRANSESOPHAGEAL ECHOCARDIOGRAM (TEE);  Surgeon: Pricilla Riffle, MD;  [facility] ENDOSCOPY;  Service: Cardiovascular;  Laterality: N/A;  . Cardioversion N/A 07/11/2013     Procedure: CARDIOVERSION;  Surgeon: Pricilla Riffle, MD;  Location[facility] ENDOSCOPY;  Service: Cardiovascular;  Laterality: N/A;  . Knee arthroscopy Right 05/18/2014    Procedure: ARTHROSCOPIC WASHOUT OF RIGHT KNEE;  Surgeon: Gary Apley, MD;  Location: [facility] OR;  Service: Orthopedics;  Laterality: Right;    Family History  Problem Relation Age of Onset  . Diabetes Mother   . Hypertension Mother   . Hypertension Sister   . Asthma Sister     Social History  Substance Use Topics  . Smoking status: Never Smoker   . Smokeless tobacco: Never Used  . Alcohol Use: No     Review of Systems  Constitutional: Negative for fever.  HENT: Negative for congestion and rhinorrhea.   Respiratory: Positive for wheezing. Negative for shortness of breath.   Cardiovascular: Negative for chest pain.  Musculoskeletal:       Swelling at LUE  10 Systems reviewed and all are negative for acute change except as noted in the HPI. Home Medications   Prior to Admission medications   Medication Sig Start Date End Date Taking? Authorizing Provider  albuterol (PROVENTIL HFA;VENTOLIN HFA) 108 (90 BASE) MCG/ACT inhaler Inhale 1 puff into the lungs every 6 (six) hours as needed for wheezing or shortness of breath.    Historical Provider, MD  amitriptyline (ELAVIL) 25 MG tablet Take 25 mg by mouth at bedtime.    Historical  Provider, MD  clonazePAM (KLONOPIN) 0.5 MG tablet Take 1 mg by mouth 2 (two) times daily as needed for anxiety.     Historical Provider, MD  lanthanum (FOSRENOL) 1000 MG chewable tablet Chew 2 tablets (2,000 mg total) by mouth 3 (three) times daily with meals. 02/05/15   Gary Dallas, MD  metoCLOPramide (REGLAN) 10 MG tablet Take 10 mg by mouth daily. 05/03/14   Historical Provider, MD  multivitamin (RENA-VIT) TABS tablet Take 1 tablet by mouth at bedtime. 05/21/14   Gary Art, DO  Omega-3 1000 MG CAPS Take 1 g by mouth 2 (two) times daily.    Historical Provider, MD  omeprazole (PRILOSEC) 10 MG  capsule Take 10 mg by mouth daily.    Historical Provider, MD  Oxycodone HCl 10 MG TABS Take 1 tablet (10 mg total) by mouth 3 (three) times daily as needed. Patient taking differently: Take 10 mg by mouth 3 (three) times daily as needed (pain).  05/21/14   Gary Art, DO  pantoprazole (PROTONIX) 40 MG tablet Take 1 tablet (40 mg total) by mouth daily. 02/05/15   Gary Dallas, MD  vitamin E 400 UNIT capsule Take 400 Units by mouth daily.    Historical Provider, MD    Allergies  Renvela  Triage Vitals: BP 103/63 mmHg  Pulse 85  Temp(Src) 98.1 F (36.7 C) (Oral)  Resp 30  Ht 6\' 2"  (1.88 m)  Wt 310 lb (140.615 kg)  BMI 39.78 kg/m2  SpO2 95%  Physical Exam  Constitutional: He is oriented to person, place, and time. He appears well-developed and well-nourished.  HENT:  Head: Normocephalic and atraumatic.  Eyes: EOM are normal.  Neck: Normal range of motion. No JVD present.  Cardiovascular: Normal rate, regular rhythm and intact distal pulses.   Murmur heard.  Systolic murmur is present  Pulmonary/Chest: Effort normal. No respiratory distress.  Bibasilar rales without wheezing Coarse expiratory breath sounds  Abdominal: Soft. He exhibits no distension. There is no tenderness.  Musculoskeletal: Normal range of motion.  No LE edema. LUE fistula appreciated without a palpable thrill.   Neurological: He is alert and oriented to person, place, and time.  Skin: Skin is warm and dry.  Psychiatric: He has a normal mood and affect. Judgment normal.  Nursing note and vitals reviewed.   ED Course  Procedures   DIAGNOSTIC STUDIES: Oxygen Saturation is 95% on 2L, adequate by my interpretation.   Labs Reviewed  CBC WITH DIFFERENTIAL/PLATELET - Abnormal; Notable for the following:    RBC 3.62 (*)    Hemoglobin 10.6 (*)    HCT 34.9 (*)    RDW 16.1 (*)    Platelets 124 (*)    All other components within normal limits  BASIC METABOLIC PANEL - Abnormal; Notable for the following:     Potassium 6.0 (*)    CO2 19 (*)    Glucose, Bld 116 (*)    BUN 145 (*)    Creatinine, Ser 30.48 (*)    Calcium 6.6 (*)    GFR calc non Af Amer 1 (*)    GFR calc Af Amer 2 (*)    Anion gap 22 (*)    All other components within normal limits    Imaging Review Dg Chest Port 1 View  02/16/2015  CLINICAL DATA:  Difficulty breathing. Bronchitis for 1 week. On home oxygen. On home dialysis, last treatment 5 days ago. EXAM: PORTABLE CHEST 1 VIEW COMPARISON:  Chest radiograph February 03, 2015  FINDINGS: Cardiac silhouette is mildly enlarged. Mediastinal silhouette is nonsuspicious. Low inspiratory portable examination with prominent vascular shadows. Elevated RIGHT hemidiaphragm. Mild suspected retrocardiac consolidation. No pneumothorax. Large body habitus. Osseous structure nonsuspicious. IMPRESSION: Stable cardiomegaly and pulmonary vascular congestion. Similar suspected retrocardiac consolidation. Electronically Signed   By: Awilda Metro M.D.   On: 02/16/2015 05:03      EKG Interpretation  Date/Time:  Monday February 16 2015 05:22:24 EST Ventricular Rate:  80 PR Interval:  166 QRS Duration: 97 QT Interval:  415 QTC Calculation: 479 R Axis:   36 Text Interpretation:  Sinus rhythm Borderline prolonged QT interval No significant change since last tracing Confirmed by Rhunette Croft, MD, Elisa Kutner [phone]) on 02/16/2015 6:13:37 AM         MDM   Final diagnoses:  Hyperkalemia  Acute pulmonary edema (HCC)  ESRD (end stage renal disease) on dialysis [facility])    I personally performed the services described in this documentation, which was scribed in my presence. The recorded information has been reviewed and is accurate.  Pt comes in with cc of wheezing, dib. He has ESRD, and completed HD at home 4 x per week. Last HD on WED, as his LUE fistula clotted up. Pt has no bruit at the site on the exam. He reports that home RN was supposed to set up a vascular f/u, but he got scared and came to  the ER. Will get basic labs and CXR.  Will need admission.  6:13 AM Spoke with Dr. Briant Cedar, he will see the patient from Nephro side. Pt will be admitted to step down given the need for close pulmonary and cardiac monitoring.    Gary Kaplan, MD 02/16/15 [phone]
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
## 2015-02-16 NOTE — H&P (Signed)
History and Physical  Patient Name: Gary Rivera     XVQ:[id number]    DOB: April 17, 1960    DOA: 02/16/2015 Referring physician: Derwood Kaplan, MD PCP: [name], MD      Chief Complaint: Dyspnea and cough  HPI: Gary Rivera is a 54 y.o. male with a past medical history significant for NICM and chronic systolic and diastolic CHF last EF 45% in 2016, OSA on nocturnal oxygen, OHS, pulmonary hypertension, and ESRD on home HD who presents with fluid overloaded and malfunctioning fistula.  The patient was previously on peritoneal dialysis which failed, and he was on intermittent dialysis, and more recently on home IHD. Unfortunately, he has recurrent problems with his left forearm fistula. The patient usually does dialysis 4-5 times per week, MWF S. Last Wednesday during dialysis he felt like his fistula was "petering out", but he is able to finish the session. Friday when he cannulated the fistula, he got "basically nothing", and when he called the dialysis office they said it'd probably clotted off. By today the patient felt no thrill at all. Tonight he got nervous and so he came to the ER.  In the ED, the patient was tachypneic and tachypneic and required aerosol mask to maintain his oxygen saturation. His potassium was 6, creatinine was 30, and BUN was 145. Nephrology recommended dialysis today. Kayexalate and calcium gluconate were administered. TRH were asked to admit for observation.     Review of Systems:  Pt complains of dyspnea, weight gain. Pt denies any fever, chills, productive cough, abdominal pain.  All other systems negative except as just noted or noted in the history of present illness.  Allergies  Allergen Reactions  . Renvela [Sevelamer] Nausea And Vomiting    Prior to Admission medications   Medication Sig Start Date End Date Taking? Authorizing Provider  albuterol (PROVENTIL HFA;VENTOLIN HFA) 108 (90 BASE) MCG/ACT inhaler Inhale 1 puff into the lungs every 6  (six) hours as needed for wheezing or shortness of breath.    Historical Provider, MD  amitriptyline (ELAVIL) 25 MG tablet Take 25 mg by mouth at bedtime.    Historical Provider, MD  clonazePAM (KLONOPIN) 0.5 MG tablet Take 1 mg by mouth 2 (two) times daily as needed for anxiety.     Historical Provider, MD  lanthanum (FOSRENOL) 1000 MG chewable tablet Chew 2 tablets (2,000 mg total) by mouth 3 (three) times daily with meals. 02/05/15   Drema Dallas, MD  metoCLOPramide (REGLAN) 10 MG tablet Take 10 mg by mouth daily. 05/03/14   Historical Provider, MD  multivitamin (RENA-VIT) TABS tablet Take 1 tablet by mouth at bedtime. 05/21/14   [name], DO  Omega-3 1000 MG CAPS Take 1 g by mouth 2 (two) times daily.    Historical Provider, MD  omeprazole (PRILOSEC) 10 MG capsule Take 10 mg by mouth daily.    Historical Provider, MD  Oxycodone HCl 10 MG TABS Take 1 tablet (10 mg total) by mouth 3 (three) times daily as needed. Patient taking differently: Take 10 mg by mouth 3 (three) times daily as needed (pain).  05/21/14   [name], DO  pantoprazole (PROTONIX) 40 MG tablet Take 1 tablet (40 mg total) by mouth daily. 02/05/15   Drema Dallas, MD  vitamin E 400 UNIT capsule Take 400 Units by mouth daily.    Historical Provider, MD    Past Medical History  Diagnosis Date  . ESRD on hemodialysis (HCC)     Started  dialysis with PD in 2013 and switched to HD in spring 2014 because of fungal peritonitis.  Takes dialysis at home 5 days a week approx 3h 15 min each session.     . CHF (congestive heart failure) (HCC)   . Peritoneal dialysis catheter in place [facility])   . Anemia     patient reporats that last hgb 7.9 a week ago  . Anxiety state, unspecified   . Hypertension     Past Surgical History  Procedure Laterality Date  . Appendectomy    . Peritoneal catheter insertion    . Tee without cardioversion N/A 07/11/2013    Procedure: TRANSESOPHAGEAL ECHOCARDIOGRAM (TEE);  Surgeon: Pricilla Riffle, MD;   Location[facility] ENDOSCOPY;  Service: Cardiovascular;  Laterality: N/A;  . Cardioversion N/A 07/11/2013    Procedure: CARDIOVERSION;  Surgeon: Pricilla Riffle, MD;  Location: [facility] ENDOSCOPY;  Service: Cardiovascular;  Laterality: N/A;  . Knee arthroscopy Right 05/18/2014    Procedure: ARTHROSCOPIC WASHOUT OF RIGHT KNEE;  Surgeon: Sheral Apley, MD;  Location: [facility] OR;  Service: Orthopedics;  Laterality: Right;    Family history: family history includes Asthma in his sister; Diabetes in his mother; Hypertension in his mother and sister.  Social History: Patient lives by himself. He  reports that he has never smoked. He has never used smokeless tobacco. He reports that he does not drink alcohol or use illicit drugs.     Physical Exam: BP 103/63 mmHg  Pulse 85  Temp(Src) 98.1 F (36.7 C) (Oral)  Resp 30  Ht  (1.88 m)  Wt 140.615 kg (310 lb)  BMI 39.78 kg/m2  SpO2 96% General appearance: Well-developed, adult male, alert and in mild distress from pulmonary edema.   ENT: No nasal deformity, discharge, or epistaxis.  OP moist without lesions.   Skin: Warm and dry. Keloids. Cardiac: RRR, nl S1-S2, no murmurs appreciated.  Respiratory: Tachypnea.  Coarse airways sounds and gurgling audible without stethoscope.  No rales, but coarse airway sounds throughout. Abdomen: Abdomen soft without rigidity.  No TTP.  Neuro: Sensorium intact and responding to questions, attention normal.  Speech is fluent.  Moves all extremities equally and with normal coordination.    Psych: Behavior appropriate.  Affect normal.  No evidence of aural or visual hallucinations or delusions.       Labs on Admission:  The metabolic panel shows normal sodium. Potassium 6, bicarbonate 19, creatinine 30, BUN 145, calcium 6. The complete blood count shows no leukocytosis. Stable anemia and thrombocytopenia.   Radiological Exams on Admission: Personally reviewed: Dg Chest Port 1 View  02/16/2015  CLINICAL DATA:   Difficulty breathing. Bronchitis for 1 week. On home oxygen. On home dialysis, last treatment 5 days ago. EXAM: PORTABLE CHEST 1 VIEW COMPARISON:  Chest radiograph February 03, 2015 FINDINGS: Cardiac silhouette is mildly enlarged. Mediastinal silhouette is nonsuspicious. Low inspiratory portable examination with prominent vascular shadows. Elevated RIGHT hemidiaphragm. Mild suspected retrocardiac consolidation. No pneumothorax. Large body habitus. Osseous structure nonsuspicious. IMPRESSION: Stable cardiomegaly and pulmonary vascular congestion. Similar suspected retrocardiac consolidation. Electronically Signed   By: Awilda Metro M.D.   On: 02/16/2015 05:03    EKG: Independently reviewed. Sinus rhythm, no peak T waves.    Assessment/Plan 1. Fluid overload and pulmonary edema:  This is new.  This is recurrent. Secondary to missed dialysis since Wednesday, due to malfunctioning fistula. It may be worth considering abandoning home IHD.    -Consult to nephrology for semi-urgent dialysis, appreciate cares -The patient is  unable to tell me the name of the vascular surgeon who has been treating him for his problems with his fistula over the last month    2. Hyperkalemia:  Kayexalate and calcium gluconate already administered. -Semiurgent dialysis -Telemetry  3. Chronic systolic and diastolic CHF:  -Volume optimization per nephrology  4. OSA and OHS:  -Nocturnal oxygen is ordered if the patient stays overnight  5. Depression and anxiety and chronic pain:  Stable.  -Continue amitriptyline, clonazepam, lanthanum, and oxycodone     DVT PPx: Heparin Diet: Renal and fluid restriction Consultants: Nephrology Code Status: Full Family Communication: Sister present at the bedside  Medical decision making: What exists of the patient's previous chart was reviewed in depth and the case was discussed with Dr. Rhunette Croft. Patient seen 6:33 AM on 02/16/2015.  Disposition Plan:  Admit for  dialysis today.  If fluid overload, hyperkalemia and acidosis are resolved, and patient has appropriate follow up for dialysis on Wednesday, could be discharged tonight.  Otherwise, may need proloned hospitalization for IHD.    Alberteen Sam [facility] Pager [phone]

## 2015-02-16 NOTE — Consult Note (Signed)
[facility] Renal Consultation Note  Indication for Consultation:  Management of ESRD/hemodialysis; anemia, hypertension/volume and secondary hyperparathyroidism  HPI: Gary Rivera is a 54 y.o. male with ESRD on HOme HD Mon Tue Thurs , Fridays last full treatment on Wed 02/12/15 presents in ER sec SOB and" my AVF clotted." He noted Friday AVF not functioning  Properly went to [facility]  At [facility] and arrangements were made for [place] appointment today. Noted prior admit 11/13-11/17/16Hperkalemia Resp distress vol overload with "malfunctioning  AVF(not able to cannulate at home  with [facility] IR fistulogram 02/04/15 "no stenosis / mild cephal v irregularity."  DC home using AVF    In ER had  tachypnea/ requiring aerosol mask to maintain his oxygen saturation. His potassium = 6, creatinine 30, and BUN  145 and Kayexalate and calcium gluconate were administered. Now in 2 H17  O2 sat 100% on 4l [place] o2, He gives History of AVF declotted Jan 20 2015 at [place]. I called center and Dr. 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 Med center , He desires further AVF work done locally by VVS .     Past Medical History  Diagnosis Date  . ESRD on hemodialysis ([place])     Started dialysis with PD in 2013 and switched to HD in spring 2014 because of fungal peritonitis.  Takes dialysis at home 5 days a week approx 3h 15 min each session.     . CHF (congestive heart failure) ([place])   . Peritoneal dialysis catheter in place [facility])   . Anemia     patient reporats that last hgb 7.9 a week ago  . Anxiety state, unspecified   . Hypertension     Past Surgical History  Procedure Laterality Date  . Appendectomy    . Peritoneal catheter insertion    . Tee without cardioversion N/A 07/11/2013    Procedure: TRANSESOPHAGEAL ECHOCARDIOGRAM (TEE);  Surgeon: Fay Records, MD;  Location: [facility] ENDOSCOPY;   Service: Cardiovascular;  Laterality: N/A;  . Cardioversion N/A 07/11/2013    Procedure: CARDIOVERSION;  Surgeon: Fay Records, MD;  Location: [place];  Service: Cardiovascular;  Laterality: N/A;  . Knee arthroscopy Right 05/18/2014    Procedure: ARTHROSCOPIC WASHOUT OF RIGHT KNEE;  Surgeon: Renette Butters, MD;  Location: [place];  Service: Orthopedics;  Laterality: Right;      Family History  Problem Relation Age of Onset  . Diabetes Mother   . Hypertension Mother   . Hypertension Sister   . Asthma Sister       reports that he has never smoked. He has never used smokeless tobacco. He reports that he does not drink alcohol or use illicit drugs.   Allergies  Allergen Reactions  . Renvela [Sevelamer] Nausea And Vomiting    Prior to Admission medications   Medication Sig Start Date End Date Taking? Authorizing Provider  amitriptyline (ELAVIL) 25 MG tablet Take 25 mg by mouth at bedtime.   Yes Historical Provider, MD  clonazePAM (KLONOPIN) 0.5 MG tablet Take 1 mg by mouth 2 (two) times daily as needed for anxiety.    Yes Historical Provider, MD  lanthanum (FOSRENOL) 1000 MG chewable tablet Chew 2 tablets (2,000 mg total) by mouth 3 (three) times daily with meals. 02/05/15  Yes Allie Bossier, MD  metoCLOPramide (REGLAN) 10 MG tablet Take 10  mg by mouth daily. 05/03/14  Yes Historical Provider, MD  multivitamin (RENA-VIT) TABS tablet Take 1 tablet by mouth at bedtime. 05/21/14  Yes [name], DO  Omega-3 1000 MG CAPS Take 1 g by mouth 2 (two) times daily.   Yes Historical Provider, MD  omeprazole (PRILOSEC) 10 MG capsule Take 10 mg by mouth daily.   Yes Historical Provider, MD  Oxycodone HCl 10 MG TABS Take 1 tablet (10 mg total) by mouth 3 (three) times daily as needed. Patient taking differently: Take 10 mg by mouth 3 (three) times daily as needed (pain).  05/21/14  Yes [name], DO  pantoprazole (PROTONIX) 40 MG tablet Take 1 tablet (40 mg total) by mouth daily. 02/05/15  Yes  Allie Bossier, MD  vitamin E 400 UNIT capsule Take 400 Units by mouth daily.   Yes Historical Provider, MD  albuterol (PROVENTIL HFA;VENTOLIN HFA) 108 (90 BASE) MCG/ACT inhaler Inhale 1 puff into the lungs every 6 (six) hours as needed for wheezing or shortness of breath.    Historical Provider, MD     Anti-infectives    None      Results for orders placed or performed during the hospital encounter of 02/16/15 (from the past 48 hour(s))  CBC with Differential/Platelet     Status: Abnormal   Collection Time: 02/16/15  5:04 AM  Result Value Ref Range   WBC 8.0 4.0 - 10.5 K/uL   RBC 3.62 (L) 4.22 - 5.81 MIL/uL   Hemoglobin 10.6 (L) 13.0 - 17.0 g/dL   HCT 34.9 (L) 39.0 - 52.0 %   MCV 96.4 78.0 - 100.0 fL   MCH 29.3 26.0 - 34.0 pg   MCHC 30.4 30.0 - 36.0 g/dL   RDW 16.1 (H) 11.5 - 15.5 %   Platelets 124 (L) 150 - 400 K/uL   Neutrophils Relative % 73 %   Neutro Abs 5.8 1.7 - 7.7 K/uL   Lymphocytes Relative 19 %   Lymphs Abs 1.5 0.7 - 4.0 K/uL   Monocytes Relative 6 %   Monocytes Absolute 0.5 0.1 - 1.0 K/uL   Eosinophils Relative 2 %   Eosinophils Absolute 0.2 0.0 - 0.7 K/uL   Basophils Relative 0 %   Basophils Absolute 0.0 0.0 - 0.1 K/uL  Basic metabolic panel     Status: Abnormal   Collection Time: 02/16/15  5:04 AM  Result Value Ref Range   Sodium 143 135 - 145 mmol/L   Potassium 6.0 (H) 3.5 - 5.1 mmol/L   Chloride 102 101 - 111 mmol/L   CO2 19 (L) 22 - 32 mmol/L   Glucose, Bld 116 (H) 65 - 99 mg/dL   BUN 145 (H) 6 - 20 mg/dL   Creatinine, Ser 30.48 (H) 0.61 - 1.24 mg/dL    Comment: RESULTS CONFIRMED BY MANUAL DILUTION   Calcium 6.6 (L) 8.9 - 10.3 mg/dL   GFR calc non Af Amer 1 (L) >60 mL/min   GFR calc Af Amer 2 (L) >60 mL/min    Comment: (NOTE) The eGFR has been calculated using the CKD EPI equation. This calculation has not been validated in all clinical situations. eGFR's persistently <60 mL/min signify possible Chronic Kidney Disease.    Anion gap 22 (H) 5 - 15      ROS:  See hpi for positives    Physical Exam: Filed Vitals:   02/16/15 0800 02/16/15 0815  BP: 141/74 127/79  Pulse: 83 87  Temp:    Resp: 22  24     General: Alert Obese WD,BM ,snoring type respirations with accessory muscles used . No sig. Distress  o 2 sat 100% 4 l [place] o2 HEENT: [place] / L neck Keloid scar IJ area ("life long present") MMM Eyes:  non icteric  Neck: large neck  No JVD Heart: Distant RRR, no rub appreciated  Lungs: Tachypnea with Bialt coarse sounds rare wheezing  bilaterally. using secondary Muscles  Abdomen: Obese , soft NT , ND Extremities: trace bipedal edema Skin: no overt rash Neuro: Alert , OX3  appropriate Dialysis Access: L FA AVF no bruit or thrill  Dialysis Orders: Center: Home HD MTTF 3.5 hrs edw 139.5 kg  2.0 K bath  .  HD B  Time  Heparin 5000. Access LFA AVF    PO calcitriol 1.30mg q day mcg Other op labs PTH 736  hgb 12.2  01/21/15   Assessment/Plan 1. Hyperkalmia/ Uremia  seconday to ESRD with Clotted AVF / Malfunctioning past week =  ER meds given Kayexalate and Ca/ Plan for Dr. SAlexander Bergeronfem HD cath today and  hd 2. Pulmonary edema / volume overload/ Ho chronic systolic diastolic CHF= by standing wt 145.3 and edw 139.5 kg and leaving below edw by op documentation's. 3. Clotted AVF- [facility] ="needs new AVF.ON  Nov 1,2016 Dr. LAugustin Coupedeclott avf with PTA  Aneurysmal area "/ Patient desires VVS to eval avf for new placement / Consult IR for perm cath in am and VVS for AVF eavl.  4. OSA/ OHS -"using nocturnal O2 per pt.  5. ESRD -   Home HD TTFr  HD today and tomorrow  6. Hypertension/volume  -  HD / no home bpmeds 7. Anemia  - no esa or iron 8. Metabolic bone disease -  Po vit d and Fosrenol  Binder  9. HO Depression/ aniety / Chronic Pain - home meds continue/ admit team RX  DErnest Haber PA-C [phone]/28/2016, 9:17 AM   Pt seen, examined and agree w A/P as above.  ESRD patient w failing access, L forearm AVF which is aneurysmal and unable to cannulate properly anymore for HD. Patient presenting again with uremai/ azotemia and high K. This AVF no longer functional, will plan placement of temp HD cath for HD today then have asked VVS to evaluate for tunneled HD cath and new permanent access.   RKelly SplinterMD CNewell Rubbermaidpager [phone]   cell [phone]/28/2016, 12:37 PM

## 2015-02-17 ENCOUNTER — Inpatient Hospital Stay (HOSPITAL_COMMUNITY): Payer: Medicare Other

## 2015-02-17 DIAGNOSIS — R0600 Dyspnea, unspecified: Secondary | ICD-10-CM

## 2015-02-17 DIAGNOSIS — N186 End stage renal disease: Secondary | ICD-10-CM

## 2015-02-17 DIAGNOSIS — J9601 Acute respiratory failure with hypoxia: Secondary | ICD-10-CM

## 2015-02-17 DIAGNOSIS — Z992 Dependence on renal dialysis: Secondary | ICD-10-CM

## 2015-02-17 DIAGNOSIS — J810 Acute pulmonary edema: Secondary | ICD-10-CM

## 2015-02-17 DIAGNOSIS — D500 Iron deficiency anemia secondary to blood loss (chronic): Secondary | ICD-10-CM

## 2015-02-17 LAB — BLOOD GAS, ARTERIAL
Acid-base deficit: 3.3 mmol/L — ABNORMAL HIGH (ref 0.0–2.0)
Bicarbonate: 24.4 mEq/L — ABNORMAL HIGH (ref 20.0–24.0)
Drawn by: 398981
O2 Content: 6 L/min
O2 Saturation: 94.8 %
Patient temperature: 98.6
TCO2: 26.6 mmol/L (ref 0–100)
pCO2 arterial: 72 mmHg (ref 35.0–45.0)
pH, Arterial: 7.156 — CL (ref 7.350–7.450)
pO2, Arterial: 93.4 mmHg (ref 80.0–100.0)

## 2015-02-17 LAB — BASIC METABOLIC PANEL
Anion gap: 15 (ref 5–15)
BUN: 80 mg/dL — AB (ref 6–20)
CALCIUM: 7 mg/dL — AB (ref 8.9–10.3)
CO2: 27 mmol/L (ref 22–32)
CREATININE: 20.75 mg/dL — AB (ref 0.61–1.24)
Chloride: 100 mmol/L — ABNORMAL LOW (ref 101–111)
GFR, EST AFRICAN AMERICAN: 2 mL/min — AB (ref >60)
GFR, EST NON AFRICAN AMERICAN: 2 mL/min — AB (ref >60)
Glucose, Bld: 88 mg/dL (ref 65–99)
Potassium: 5.1 mmol/L (ref 3.5–5.1)
SODIUM: 142 mmol/L (ref 135–145)

## 2015-02-17 LAB — POCT I-STAT 3, ART BLOOD GAS (G3+)
Acid-base deficit: 1 mmol/L (ref 0.0–2.0)
Acid-base deficit: 1 mmol/L (ref 0.0–2.0)
BICARBONATE: 29.3 meq/L — AB (ref 20.0–24.0)
BICARBONATE: 27.7 meq/L — AB (ref 20.0–24.0)
BICARBONATE: 27.4 meq/L — AB (ref 20.0–24.0)
O2 SAT: 95 %
O2 SAT: 97 %
O2 Saturation: 80 %
PCO2 ART: 78.2 mmHg — AB (ref 35.0–45.0)
PCO2 ART: 59.8 mmHg — AB (ref 35.0–45.0)
PCO2 ART: 63.5 mmHg — AB (ref 35.0–45.0)
PH ART: 7.182 — AB (ref 7.350–7.450)
PO2 ART: 53 mmHg — AB (ref 80.0–100.0)
Patient temperature: 98.6
Patient temperature: 98.6
TCO2: 32 mmol/L (ref 0–100)
TCO2: 30 mmol/L (ref 0–100)
TCO2: 29 mmol/L (ref 0–100)
pH, Arterial: 7.275 — ABNORMAL LOW (ref 7.350–7.450)
pH, Arterial: 7.243 — ABNORMAL LOW (ref 7.350–7.450)
pO2, Arterial: 96 mmHg (ref 80.0–100.0)
pO2, Arterial: 112 mmHg — ABNORMAL HIGH (ref 80.0–100.0)

## 2015-02-17 LAB — CBC
HCT: 35.6 % — ABNORMAL LOW (ref 39.0–52.0)
Hemoglobin: 10.5 g/dL — ABNORMAL LOW (ref 13.0–17.0)
MCH: 28.6 pg (ref 26.0–34.0)
MCHC: 29.5 g/dL — ABNORMAL LOW (ref 30.0–36.0)
MCV: 97 fL (ref 78.0–100.0)
PLATELETS: 139 10*3/uL — AB (ref 150–400)
RBC: 3.67 MIL/uL — AB (ref 4.22–5.81)
RDW: 16 % — AB (ref 11.5–15.5)
WBC: 8.1 10*3/uL (ref 4.0–10.5)

## 2015-02-17 MED ORDER — DEXTROSE 5 % IV SOLN
1.5000 g | INTRAVENOUS | Status: AC
  Administered 2015-02-18: 1.5 g via INTRAVENOUS
  Filled 2015-02-17 (×2): qty 1.5

## 2015-02-17 MED ORDER — IPRATROPIUM-ALBUTEROL 0.5-2.5 (3) MG/3ML IN SOLN
3.0000 mL | Freq: Four times a day (QID) | RESPIRATORY_TRACT | Status: DC
  Administered 2015-02-17 – 2015-02-19 (×8): 3 mL via RESPIRATORY_TRACT
  Filled 2015-02-17 (×10): qty 3

## 2015-02-17 MED ORDER — ACETAMINOPHEN 325 MG PO TABS
650.0000 mg | ORAL_TABLET | ORAL | Status: DC | PRN
  Administered 2015-02-17: 650 mg via ORAL
  Filled 2015-02-17: qty 2

## 2015-02-17 NOTE — Progress Notes (Signed)
[facility] PROGRESS NOTE    Progress Note   Gary Rivera BWL:[id number] DOB: 05-29-1960 DOA: 2015/02/24 PCP: [name], MD   Brief Narrative:   Gary Rivera is an 54 y.o. male past medical history significant for chronic systolic heart failure, end-stage renal disease pulmonary hypertension who presents with dyspnea and cough the patient usually does dialysis 4-5 times per week but, but last Friday did not have his dialysis treatment.  Assessment/Plan:  Acute respiratory failure with hypoxia (HCC) due to  Pulmonary edema/Chronic systolic congestive heart failure, NYHA class 1 /  End stage renal disease-  on HD/clotted AV fistula: - Temporary cath for dialysis placed on 02/24/2015. By nephrology. Patient received hemodialysis on 02-24-15 with only 2.8 L net ultrafiltration. - Vascular neurosurgery was consulted, and they recommended left arm brachiocephalic fistula. With placement of tunnel catheter on 02/18/2015. - ABG showed 7.15/72/93, compatible with respiratory acidosis started on BiPAP. - Tolerating dialysis and saturating well. Recheck an ABG 1 hour after HD. Check a chest x-ray.  Hyperkalemia: Receive Kayexalate currently receiving hemodialysis right now is potassium is down.  Normocytic   Anemia  continue further management per renal.     Pulmonary HTN- 50 mmHg 2011 ECHO    Obesity (BMI 30-39.9):  Thrombocytopenia (HCC): Mild platelets continue to be stable.  Obesity hypoventilation syndrome (HCC)    DVT Prophylaxis - Lovenox ordered.  Family Communication: none Disposition Plan: Home when stable. Code Status:     Code Status Orders        Start     Ordered   February 24, 2015 0939  Full code   Continuous     02/24/15 0938    Advance Directive Documentation        Most Recent Value   Type of Advance Directive  Healthcare Power of Attorney   Pre-existing out of facility DNR order (yellow form or pink MOST form)     "MOST" Form in Place?           IV Access:    Peripheral IV   Procedures and diagnostic studies:   Dg Chest Port 1 View  2015-02-24  CLINICAL DATA:  Difficulty breathing. Bronchitis for 1 week. On home oxygen. On home dialysis, last treatment 5 days ago. EXAM: PORTABLE CHEST 1 VIEW COMPARISON:  Chest radiograph February 03, 2015 FINDINGS: Cardiac silhouette is mildly enlarged. Mediastinal silhouette is nonsuspicious. Low inspiratory portable examination with prominent vascular shadows. Elevated RIGHT hemidiaphragm. Mild suspected retrocardiac consolidation. No pneumothorax. Large body habitus. Osseous structure nonsuspicious. IMPRESSION: Stable cardiomegaly and pulmonary vascular congestion. Similar suspected retrocardiac consolidation. Electronically Signed   By: Awilda Metro M.D.   On: 02-24-2015 05:03     Medical Consultants:    None.  Anti-Infectives:   Anti-infectives    Start     Dose/Rate Route Frequency Ordered Stop   02/17/15 0600  cefUROXime (ZINACEF) 1.5 g in dextrose 5 % 50 mL IVPB     1.5 g 100 mL/hr over 30 Minutes Intravenous On call to O.R. 02-24-15 1313 02/18/15 0559      Subjective:    Gary Rivera  relates his breathing is better.   Objective:    Filed Vitals:   02/17/15 0457 02/17/15 0524 02/17/15 0559 02/17/15 0648  BP:    112/72  Pulse: 86  74 81  Temp:  98.6 F (37 C)    TempSrc:  Oral    Resp: 22  25 24   Height:      Weight:  SpO2: 98%  100% 100%    Intake/Output Summary (Last 24 hours) at 02/17/15 0810 Last data filed at 02/16/15 2000  Gross per 24 hour  Intake    240 ml  Output   2826 ml  Net  -2586 ml   Filed Weights   02/16/15 1310 02/16/15 1630 02/17/15 0425  Weight: 145.3 kg (320 lb 5.3 oz) 142.6 kg (314 lb 6 oz) 142.1 kg (313 lb 4.4 oz)    Exam: Gen:  NAD Cardiovascular:  RRR, No M/R/G Chest and lungs:  Good air movement with crackles bilaterally. Abdomen:  Abdomen soft, NT/ND, + BSExtremities:  No C/E/C   Data Reviewed:     Labs: Basic Metabolic Panel:  Recent Labs Lab 02/16/15 0504 02/16/15 1305 02/17/15 0500  NA 143 144 142  K 6.0* 7.1* 5.1  CL 102 102 100*  CO2 19* 23 27  GLUCOSE 116* 87 88  BUN 145* 151* 80*  CREATININE 30.48* 30.96* 20.75*  CALCIUM 6.6* 6.4* 7.0*  PHOS  --  11.3*  --    GFR Estimated Creatinine Clearance: 6 mL/min (by C-G formula based on Cr of 20.75). Liver Function Tests:  Recent Labs Lab 02/16/15 1305  ALBUMIN 3.5   No results for input(s): LIPASE, AMYLASE in the last 168 hours. No results for input(s): AMMONIA in the last 168 hours. Coagulation profile No results for input(s): INR, PROTIME in the last 168 hours.  CBC:  Recent Labs Lab 02/16/15 0504 02/17/15 0500  WBC 8.0 8.1  NEUTROABS 5.8  --   HGB 10.6* 10.5*  HCT 34.9* 35.6*  MCV 96.4 97.0  PLT 124* 139*   Cardiac Enzymes: No results for input(s): CKTOTAL, CKMB, CKMBINDEX, TROPONINI in the last 168 hours. BNP (last 3 results) No results for input(s): PROBNP in the last 8760 hours. CBG: No results for input(s): GLUCAP in the last 168 hours. D-Dimer: No results for input(s): DDIMER in the last 72 hours. Hgb A1c: No results for input(s): HGBA1C in the last 72 hours. Lipid Profile: No results for input(s): CHOL, HDL, LDLCALC, TRIG, CHOLHDL, LDLDIRECT in the last 72 hours. Thyroid function studies: No results for input(s): TSH, T4TOTAL, T3FREE, THYROIDAB in the last 72 hours.  Invalid input(s): FREET3 Anemia work up: No results for input(s): VITAMINB12, FOLATE, FERRITIN, TIBC, IRON, RETICCTPCT in the last 72 hours. Sepsis Labs:  Recent Labs Lab 02/16/15 0504 02/17/15 0500  WBC 8.0 8.1   Microbiology No results found for this or any previous visit (from the past 240 hour(s)).   Medications:   . amitriptyline  25 mg Oral QHS  . cefUROXime (ZINACEF)  IV  1.5 g Intravenous On Call to OR  . heparin  5,000 Units Subcutaneous 3 times per day  . ipratropium-albuterol  3 mL Nebulization  Q6H  . lanthanum  2,000 mg Oral TID WC  . metoCLOPramide  10 mg Oral Daily  . pantoprazole  40 mg Oral Daily  . sodium chloride  3 mL Intravenous Q12H   Continuous Infusions:   Time spent: 35 min  LOS: 1 day   [name]  [facility] Pager [phone]  *Please refer to [email or web address], password [email or web address] to get updated schedule on who will round on this patient, as hospitalists switch teams weekly. If 7PM-7AM, please contact night-coverage at [email or web address], password [email or web address] for any overnight needs.  02/17/2015, 8:10 AM

## 2015-02-17 NOTE — Progress Notes (Signed)
The patient's respiratory pattern appeared labored and shallow with snoring-like sounds audible without auscultation (while awake) When auscultated lung sounds bilateral sounded like very loud deep wheezing/snoring. Patient denied feeling short of breath or that he was struggling to breathe. Patient's level of consciousness remained A/O x 4 with some drowsiness and forgetfulness. O2 via nasal cannula needed to be increased throughout the night from 2L to 6L. Snoring got worse throughout the night. MD paged for breathing treatment and ABG. ABG came back 7.156 pH, 72 CO2, 93.4 O2, 24.4 Bicarb. MD paged, bipap ordered with repeat ABG. Will continue to monitor.

## 2015-02-17 NOTE — Consult Note (Signed)
PULMONARY / CRITICAL CARE MEDICINE   Name: Gary Rivera MRN: [id number] DOB: 12/10/1960    ADMISSION DATE:  02/16/2015 CONSULTATION DATE: 11/29  REFERRING MD :  Rozanna Box  CHIEF COMPLAINT: SOB  HISTORY OF PRESENT ILLNESS:   54 yo MO(318 lbs), never smoker, ESRD with missed HD due to clotted left AV graft x 3 days who presented with volume overload, hyperkalemia, hypoxia/hypercarbia and is currently on NIMVS while on HD via rt femoral trialysis cath. PCCM called to bedside for PH 7.2(should correct with HD with volume reduction and NIMVS, Pco2 78). Note he was intubated 01/30/15 and was listed as a difficult intubation. He has an extensive pmh of MO, CHF, PAH, ESRD on home dialysis, anxiety state, and A flutter. He is awake and alert, hemodynamically stable for now and should avoid intubation but with his health history he may worsen at any time. Plan for new AV graft 11/30.  PAST MEDICAL HISTORY :  He  has a past medical history of ESRD on hemodialysis (HCC); CHF (congestive heart failure) (HCC); Peritoneal dialysis catheter in place [facility]); Anemia; Anxiety state, unspecified; and Hypertension.  PAST SURGICAL HISTORY: He  has past surgical history that includes Appendectomy; Peritoneal catheter insertion; TEE without cardioversion (N/A, 07/11/2013); Cardioversion (N/A, 07/11/2013); and Knee arthroscopy (Right, 05/18/2014).  Allergies  Allergen Reactions  . Renvela [Sevelamer] Nausea And Vomiting    No current facility-administered medications on file prior to encounter.   Current Outpatient Prescriptions on File Prior to Encounter  Medication Sig  . amitriptyline (ELAVIL) 25 MG tablet Take 25 mg by mouth at bedtime.  . clonazePAM (KLONOPIN) 0.5 MG tablet Take 1 mg by mouth 2 (two) times daily as needed for anxiety.   Marland Kitchen lanthanum (FOSRENOL) 1000 MG chewable tablet Chew 2 tablets (2,000 mg total) by mouth 3 (three) times daily with meals.  . metoCLOPramide (REGLAN) 10 MG tablet Take 10 mg  by mouth daily.  . multivitamin (RENA-VIT) TABS tablet Take 1 tablet by mouth at bedtime.  . Omega-3 1000 MG CAPS Take 1 g by mouth 2 (two) times daily.  Marland Kitchen omeprazole (PRILOSEC) 10 MG capsule Take 10 mg by mouth daily.  . Oxycodone HCl 10 MG TABS Take 1 tablet (10 mg total) by mouth 3 (three) times daily as needed. (Patient taking differently: Take 10 mg by mouth 3 (three) times daily as needed (pain). )  . pantoprazole (PROTONIX) 40 MG tablet Take 1 tablet (40 mg total) by mouth daily.  . vitamin E 400 UNIT capsule Take 400 Units by mouth daily.  Marland Kitchen albuterol (PROVENTIL HFA;VENTOLIN HFA) 108 (90 BASE) MCG/ACT inhaler Inhale 1 puff into the lungs every 6 (six) hours as needed for wheezing or shortness of breath.    FAMILY HISTORY:  His indicated that his mother is deceased. He indicated that his sister is alive.   SOCIAL HISTORY: He  reports that he has never smoked. He has never used smokeless tobacco. He reports that he does not drink alcohol or use illicit drugs.  REVIEW OF SYSTEMS:   10 point review of system taken, please see HPI for positives and negatives.   SUBJECTIVE:  Not in acute distress VITAL SIGNS: BP 119/78 mmHg  Pulse 76  Temp(Src) 97.3 F (36.3 C) (Oral)  Resp 19  Ht 6\' 2"  (1.88 m)  Wt 314 lb 9.5 oz (142.7 kg)  BMI 40.37 kg/m2  SpO2 99%  HEMODYNAMICS:    VENTILATOR SETTINGS: Vent Mode:  [-] BIPAP FiO2 (%):  [50 %] 50 %  Set Rate:  [12 bmp] 12 bmp PEEP:  [6 cmH20] 6 cmH20  INTAKE / OUTPUT: I/O last 3 completed shifts: In: 240 [P.O.:240] Out: 2826 [Other:2826]  PHYSICAL EXAMINATION: General: MO AAM on NIMVS Neuro:  Awake and follows commands, mae x 4 HEENT: NO neck, NIMVS in place, large rt neck keloid  Cardiovascular:  HSD  Lungs:Decreased  bs thru out Abdomen:  Obese , + bs Musculoskeletal:  intact Skin: left graft av forearm clotted and without thrill or bruit , lower ext edema  LABS:  CBC  Recent Labs Lab 02/16/15 0504 02/17/15 0500   WBC 8.0 8.1  HGB 10.6* 10.5*  HCT 34.9* 35.6*  PLT 124* 139*   Coag's No results for input(s): APTT, INR in the last 168 hours. BMET  Recent Labs Lab 02/16/15 0504 02/16/15 1305 02/17/15 0500  NA 143 144 142  K 6.0* 7.1* 5.1  CL 102 102 100*  CO2 19* 23 27  BUN 145* 151* 80*  CREATININE 30.48* 30.96* 20.75*  GLUCOSE 116* 87 88   Electrolytes  Recent Labs Lab 02/16/15 0504 02/16/15 1305 02/17/15 0500  CALCIUM 6.6* 6.4* 7.0*  PHOS  --  11.3*  --    Sepsis Markers No results for input(s): LATICACIDVEN, PROCALCITON, O2SATVEN in the last 168 hours. ABG  Recent Labs Lab 02/17/15 0503 02/17/15 0923  PHART 7.156* 7.182*  PCO2ART 72.0* 78.2*  PO2ART 93.4 96.0   Liver Enzymes  Recent Labs Lab 02/16/15 1305  ALBUMIN 3.5   Cardiac Enzymes No results for input(s): TROPONINI, PROBNP in the last 168 hours. Glucose No results for input(s): GLUCAP in the last 168 hours.  Imaging Dg Chest Port 1 View  02/17/2015  CLINICAL DATA:  Dyspnea.  Evaluate pulmonary edema. EXAM: PORTABLE CHEST 1 VIEW COMPARISON:  02/16/2015 and 02/03/2015 FINDINGS: Cardiac leads project over the chest. Stable enlarged cardiac silhouette. Prominent central pulmonary vascularity appears similar to recent priors. Lung fields are clear. No evidence of pulmonary edema. Improved visualization of the left hemidiaphragm compared to yesterday's chest radiograph suggests improved aeration in the left lung base. No visible pleural effusion. IMPRESSION: Central pulmonary vascular congestion without active pulmonary edema. Improved aeration of the left lung base. Electronically Signed   By: Britta Mccreedy M.D.   On: 02/17/2015 09:22     STUDIES:    CULTURES:   ANTIBIOTICS:   SIGNIFICANT EVENTS: 11/29 resp failure  LINES/TUBES: 11/28 rt fem 3 port hd cath>  DISCUSSION: 54 yo MO(318 lbs), never smoker, ESRD with missed HD due to clotted left AV graft x 3 days who presented with volume overload,  hyperkalemia, hypoxia/hypercarbia and is currently on NIMVS while on HD via rt femoral trialysis cath. PCCM called to bedside for PH 7.2(should correct with HD with volume reduction and NIMVS, Pco2 78). Note he was intubated 01/30/15 and was listed as a difficult intubation. He has an extensive pmh of MO, CHF, PAH, ESRD on home dialysis, anxiety state, and A flutter. He is awake and alert, hemodynamically stable for now and should avoid intubation but with his health history he may worsen at any time. Plan for new AV graft 11/30.  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 Anxiety state P:   RASS goal: 1 Avoid sedation as possible   FAMILY  - Updates: None at bedside  - Inter-disciplinary family meet or Palliative Care meeting due by:  day 7    [facility] Minor ACNP Adolph Pollack PCCM Pager [phone] till 3 pm If no answer page [phone] 02/17/2015, 10:17 AM   STAFF NOTE: I, Rory Percy, MD FACP have personally reviewed patient's available data, including medical history, events of note, physical examination and test results as part of my evaluation. I have discussed with resident/NP and other care providers such as pharmacist, RN and RRT. In addition, I personally evaluated patient and elicited key findings of: Awake, cooperative, no distress, on NIMV, edema on pcxr, on HD now have  1 hour left, for 3 liters removal, NIMV should be continued, increase guaranteed rate 24, reduce peep to 5, increase PC to improve co2 removal, repeat abg in 1 hr, he should continued NIMV 4 hr on 30 min off x 24 hours, likely need repeat hd daily, chem in am , abg to follow after above changes, keep MAP less 100, may benefit from NTG infusion, move to ICU, may require intubation The patient is critically ill with multiple organ systems failure and requires high complexity decision making for assessment and support, frequent evaluation and titration of therapies, application of advanced monitoring technologies and extensive interpretation of multiple databases.   Critical Care Time devoted to patient care services described in this note is 35 Minutes. This time reflects time of care of this signee: Rory Percy, MD FACP. This critical care time does not reflect procedure time, or teaching time or supervisory time of PA/NP/Med student/Med Resident etc but could involve care discussion time. Rest per NP/medical resident whose note is outlined above and that I agree with   Mcarthur Rossetti. Tyson Alias, MD, FACP Pgr: [phone] [facility] 02/17/2015 11:51 AM

## 2015-02-17 NOTE — Progress Notes (Signed)
CRITICAL VALUE ALERT  Critical value received:  ABG: pH 7.156, CO2 72, O2 93.4, Bicarb 24.4  Date of notification:  02/17/15  Time of notification:  05:13  Critical value read back: yes  Nurse who received alert:  Leda Gauze  MD notified (1st page):  Schorr  Time of first page:  05:25   Responding MD:  Schorr  Time MD responded:  05:43  Bipap ordered

## 2015-02-17 NOTE — Progress Notes (Signed)
  [facility] Progress Note   Subjective: HD this am, 2.5kg off w BP drops, feeling "much better"  Filed Vitals:   02/17/15 1120 02/17/15 1123 02/17/15 1200 02/17/15 1206  BP: 132/58 132/58  132/58  Pulse: 87 86  86  Temp: 97.7 F (36.5 C)   97.8 F (36.6 C)  TempSrc: Oral   Oral  Resp: 14 11  19   Height:      Weight: 140.2 kg (309 lb 1.4 oz)     SpO2: 100% 100% 90% 94%    Inpatient medications: . cefUROXime (ZINACEF)  IV  1.5 g Intravenous On Call to OR  . heparin  5,000 Units Subcutaneous 3 times per day  . ipratropium-albuterol  3 mL Nebulization Q6H  . lanthanum  2,000 mg Oral TID WC  . metoCLOPramide  10 mg Oral Daily  . pantoprazole  40 mg Oral Daily  . sodium chloride  3 mL Intravenous Q12H     sodium chloride, sodium chloride, alteplase, clonazePAM, heparin, heparin, lidocaine (PF), lidocaine-prilocaine, oxyCODONE, pentafluoroprop-tetrafluoroeth  Exam: Alert, snoring loudly No jvd Chest clear bilat RRR no RG ABd obese soft ntnd no ascites No LE edema LFA AVF +bruit Neuro alert ox 3     Home HD  MTTF   3.5h  139.5kg   2k bath   Heparin 5000  LFA AVF Calcitriol 1.25 ug q day Labs pth 738 Hb 12.2      Assessment: 1 Hyperkalemia resolved 2 AV fistula access failure 3 ESRD  temp cath for now 4 OSA/ OHS in ICU now 5 MBD cont meds 6 Anemia no esa, Hb good 7 HTN no home bp meds  Plan - for new AVF and tunneled cath tomorrow per VVS. Next HD prob Thursday, sooner if needed   [name] MD [facility] pager [phone]    cell [phone] 02/17/2015, 1:23 PM    Recent Labs Lab 02/16/15 0504 02/16/15 1305 02/17/15 0500  NA 143 144 142  K 6.0* 7.1* 5.1  CL 102 102 100*  CO2 19* 23 27  GLUCOSE 116* 87 88  BUN 145* 151* 80*  CREATININE 30.48* 30.96* 20.75*  CALCIUM 6.6* 6.4* 7.0*  PHOS  --  11.3*  --     Recent Labs Lab 02/16/15 1305  ALBUMIN 3.5    Recent Labs Lab 02/16/15 0504 02/17/15 0500  WBC 8.0 8.1   NEUTROABS 5.8  --   HGB 10.6* 10.5*  HCT 34.9* 35.6*  MCV 96.4 97.0  PLT 124* 139*

## 2015-02-18 ENCOUNTER — Encounter (HOSPITAL_COMMUNITY)
Admission: EM | Disposition: A | Discharge status: Home / Self Care | Payer: Self-pay | Source: Home / Self Care | Attending: Internal Medicine

## 2015-02-18 ENCOUNTER — Inpatient Hospital Stay (HOSPITAL_COMMUNITY): Payer: Medicare Other

## 2015-02-18 ENCOUNTER — Inpatient Hospital Stay (HOSPITAL_COMMUNITY): Payer: Medicare Other | Admitting: Certified Registered Nurse Anesthetist

## 2015-02-18 DIAGNOSIS — N185 Chronic kidney disease, stage 5: Secondary | ICD-10-CM

## 2015-02-18 HISTORY — PX: INSERTION OF DIALYSIS CATHETER: SHX1324

## 2015-02-18 HISTORY — PX: AV FISTULA PLACEMENT: SHX1204

## 2015-02-18 LAB — BASIC METABOLIC PANEL
Anion gap: 13 (ref 5–15)
BUN: 64 mg/dL — AB (ref 6–20)
CO2: 27 mmol/L (ref 22–32)
Calcium: 7.1 mg/dL — ABNORMAL LOW (ref 8.9–10.3)
Chloride: 99 mmol/L — ABNORMAL LOW (ref 101–111)
Creatinine, Ser: 16.82 mg/dL — ABNORMAL HIGH (ref 0.61–1.24)
GFR calc Af Amer: 3 mL/min — ABNORMAL LOW (ref >60)
GFR, EST NON AFRICAN AMERICAN: 3 mL/min — AB (ref >60)
Glucose, Bld: 80 mg/dL (ref 65–99)
POTASSIUM: 4.4 mmol/L (ref 3.5–5.1)
SODIUM: 139 mmol/L (ref 135–145)

## 2015-02-18 LAB — CBC
HEMATOCRIT: 35.6 % — AB (ref 39.0–52.0)
Hemoglobin: 10.4 g/dL — ABNORMAL LOW (ref 13.0–17.0)
MCH: 28.3 pg (ref 26.0–34.0)
MCHC: 29.2 g/dL — ABNORMAL LOW (ref 30.0–36.0)
MCV: 96.7 fL (ref 78.0–100.0)
PLATELETS: 133 10*3/uL — AB (ref 150–400)
RBC: 3.68 MIL/uL — ABNORMAL LOW (ref 4.22–5.81)
RDW: 15.5 % (ref 11.5–15.5)
WBC: 5.2 10*3/uL (ref 4.0–10.5)

## 2015-02-18 SURGERY — ARTERIOVENOUS (AV) FISTULA CREATION
Anesthesia: Monitor Anesthesia Care | Site: Chest | Laterality: Right

## 2015-02-18 MED ORDER — HEPARIN SODIUM (PORCINE) 1000 UNIT/ML IJ SOLN
INTRAMUSCULAR | Status: DC | PRN
  Administered 2015-02-18: 4.6 mL

## 2015-02-18 MED ORDER — FENTANYL CITRATE (PF) 250 MCG/5ML IJ SOLN
INTRAMUSCULAR | Status: AC
  Filled 2015-02-18: qty 5

## 2015-02-18 MED ORDER — 0.9 % SODIUM CHLORIDE (POUR BTL) OPTIME
TOPICAL | Status: DC | PRN
  Administered 2015-02-18: 1000 mL

## 2015-02-18 MED ORDER — MIDAZOLAM HCL 2 MG/2ML IJ SOLN
INTRAMUSCULAR | Status: AC
  Filled 2015-02-18: qty 2

## 2015-02-18 MED ORDER — THROMBIN 20000 UNITS EX SOLR
CUTANEOUS | Status: AC
  Filled 2015-02-18: qty 20000

## 2015-02-18 MED ORDER — PROPOFOL 10 MG/ML IV BOLUS
INTRAVENOUS | Status: AC
  Filled 2015-02-18: qty 20

## 2015-02-18 MED ORDER — LIDOCAINE HCL (PF) 1 % IJ SOLN
INTRAMUSCULAR | Status: DC | PRN
  Administered 2015-02-18: 11 mL via INTRADERMAL
  Administered 2015-02-18: 30 mL via INTRADERMAL

## 2015-02-18 MED ORDER — DEXMEDETOMIDINE HCL IN NACL 200 MCG/50ML IV SOLN
INTRAVENOUS | Status: DC | PRN
  Administered 2015-02-18: .2 ug/kg/h via INTRAVENOUS

## 2015-02-18 MED ORDER — DEXMEDETOMIDINE HCL IN NACL 200 MCG/50ML IV SOLN
INTRAVENOUS | Status: AC
  Filled 2015-02-18: qty 50

## 2015-02-18 MED ORDER — MIDAZOLAM HCL 5 MG/5ML IJ SOLN
INTRAMUSCULAR | Status: DC | PRN
  Administered 2015-02-18 (×2): 1 mg via INTRAVENOUS

## 2015-02-18 MED ORDER — FENTANYL CITRATE (PF) 100 MCG/2ML IJ SOLN
INTRAMUSCULAR | Status: DC | PRN
  Administered 2015-02-18 (×3): 25 ug via INTRAVENOUS

## 2015-02-18 MED ORDER — HEPARIN SODIUM (PORCINE) 1000 UNIT/ML IJ SOLN
INTRAMUSCULAR | Status: DC | PRN
  Administered 2015-02-18: 5000 [IU] via INTRAVENOUS

## 2015-02-18 MED ORDER — ONDANSETRON HCL 4 MG/2ML IJ SOLN: 4.0000 mg | Freq: Once | INTRAMUSCULAR | Status: DC | PRN

## 2015-02-18 MED ORDER — DEXMEDETOMIDINE HCL 200 MCG/2ML IV SOLN
INTRAVENOUS | Status: DC | PRN
  Administered 2015-02-18 (×2): 8 ug via INTRAVENOUS
  Administered 2015-02-18: 4 ug via INTRAVENOUS
  Administered 2015-02-18: 8 ug via INTRAVENOUS
  Administered 2015-02-18: 4 ug via INTRAVENOUS
  Administered 2015-02-18: 8 ug via INTRAVENOUS

## 2015-02-18 MED ORDER — SODIUM CHLORIDE 0.9 % IV SOLN
INTRAVENOUS | Status: DC | PRN
  Administered 2015-02-18: 14:00:00

## 2015-02-18 MED ORDER — LIDOCAINE HCL (PF) 1 % IJ SOLN
INTRAMUSCULAR | Status: AC
  Filled 2015-02-18: qty 30

## 2015-02-18 MED ORDER — HEPARIN SODIUM (PORCINE) 1000 UNIT/ML IJ SOLN
INTRAMUSCULAR | Status: AC
  Filled 2015-02-18: qty 1

## 2015-02-18 SURGICAL SUPPLY — 63 items
ARMBAND PINK RESTRICT EXTREMIT (MISCELLANEOUS) ×3 IMPLANT
BAG DECANTER FOR FLEXI CONT (MISCELLANEOUS) ×3 IMPLANT
BIOPATCH RED 1 DISK 7.0 (GAUZE/BANDAGES/DRESSINGS) ×3 IMPLANT
BNDG GAUZE ELAST 4 BULKY (GAUZE/BANDAGES/DRESSINGS) ×3 IMPLANT
CANISTER SUCTION 2500CC (MISCELLANEOUS) ×3 IMPLANT
CANNULA VESSEL 3MM 2 BLNT TIP (CANNULA) ×3 IMPLANT
CATH ANGIO 5F BER2 65CM (CATHETERS) ×3 IMPLANT
CATH CANNON HEMO 15F 50CM (CATHETERS) IMPLANT
CATH CANNON HEMO 15FR 19 (HEMODIALYSIS SUPPLIES) IMPLANT
CATH CANNON HEMO 15FR 23CM (HEMODIALYSIS SUPPLIES) ×6 IMPLANT
CATH CANNON HEMO 15FR 31CM (HEMODIALYSIS SUPPLIES) IMPLANT
CATH CANNON HEMO 15FR 32CM (HEMODIALYSIS SUPPLIES) IMPLANT
CATH EMB 3FR 80CM (CATHETERS) ×3 IMPLANT
CATH PALINDROME REV 23CM (CATHETERS) IMPLANT
CATH STRAIGHT 5FR 65CM (CATHETERS) ×3 IMPLANT
CHLORAPREP W/TINT 26ML (MISCELLANEOUS) ×3 IMPLANT
CLIP TI MEDIUM 6 (CLIP) ×3 IMPLANT
CLIP TI WIDE RED SMALL 6 (CLIP) ×3 IMPLANT
COVER PROBE W GEL 5X96 (DRAPES) ×3 IMPLANT
DECANTER SPIKE VIAL GLASS SM (MISCELLANEOUS) ×3 IMPLANT
DRAIN PENROSE 1/4X12 LTX STRL (WOUND CARE) ×3 IMPLANT
DRAPE C-ARM 42X72 X-RAY (DRAPES) ×3 IMPLANT
DRAPE CHEST BREAST 15X10 FENES (DRAPES) ×3 IMPLANT
DRSG ADAPTIC 3X8 NADH LF (GAUZE/BANDAGES/DRESSINGS) ×3 IMPLANT
ELECT REM PT RETURN 9FT ADLT (ELECTROSURGICAL) ×3
ELECTRODE REM PT RTRN 9FT ADLT (ELECTROSURGICAL) ×2 IMPLANT
GAUZE SPONGE 2X2 8PLY STRL LF (GAUZE/BANDAGES/DRESSINGS) ×2 IMPLANT
GAUZE SPONGE 4X4 16PLY XRAY LF (GAUZE/BANDAGES/DRESSINGS) ×6 IMPLANT
GLOVE BIO SURGEON STRL SZ7.5 (GLOVE) ×6 IMPLANT
GLOVE BIOGEL PI IND STRL 7.5 (GLOVE) ×2 IMPLANT
GLOVE BIOGEL PI INDICATOR 7.5 (GLOVE) ×1
GLOVE ECLIPSE 7.0 STRL STRAW (GLOVE) ×3 IMPLANT
GLOVE SURG SS PI 6.5 STRL IVOR (GLOVE) ×3 IMPLANT
GLOVE SURG SS PI 7.5 STRL IVOR (GLOVE) ×3 IMPLANT
GOWN STRL REUS W/ TWL LRG LVL3 (GOWN DISPOSABLE) ×6 IMPLANT
GOWN STRL REUS W/TWL LRG LVL3 (GOWN DISPOSABLE) ×3
KIT BASIN OR (CUSTOM PROCEDURE TRAY) ×3 IMPLANT
KIT ROOM TURNOVER OR (KITS) ×3 IMPLANT
LIQUID BAND (GAUZE/BANDAGES/DRESSINGS) ×3 IMPLANT
LOOP VESSEL MINI RED (MISCELLANEOUS) IMPLANT
NEEDLE 18GX1X1/2 (RX/OR ONLY) (NEEDLE) ×3 IMPLANT
NEEDLE HYPO 25GX1X1/2 BEV (NEEDLE) ×3 IMPLANT
NS IRRIG 1000ML POUR BTL (IV SOLUTION) ×3 IMPLANT
PACK CV ACCESS (CUSTOM PROCEDURE TRAY) ×3 IMPLANT
PACK SURGICAL SETUP 50X90 (CUSTOM PROCEDURE TRAY) ×3 IMPLANT
PAD ARMBOARD 7.5X6 YLW CONV (MISCELLANEOUS) ×6 IMPLANT
SET MICROPUNCTURE 5F STIFF (MISCELLANEOUS) IMPLANT
SPONGE GAUZE 2X2 STER 10/PKG (GAUZE/BANDAGES/DRESSINGS) ×1
SPONGE GAUZE 4X4 12PLY STER LF (GAUZE/BANDAGES/DRESSINGS) ×3 IMPLANT
SPONGE SURGIFOAM ABS GEL 100 (HEMOSTASIS) IMPLANT
SUT ETHILON 3 0 PS 1 (SUTURE) ×21 IMPLANT
SUT PROLENE 7 0 BV 1 (SUTURE) ×6 IMPLANT
SUT VIC AB 3-0 SH 27 (SUTURE) ×1
SUT VIC AB 3-0 SH 27X BRD (SUTURE) ×2 IMPLANT
SUT VICRYL 4-0 PS2 18IN ABS (SUTURE) ×3 IMPLANT
SYR 20CC LL (SYRINGE) ×6 IMPLANT
SYR 5ML LL (SYRINGE) ×3 IMPLANT
SYR CONTROL 10ML LL (SYRINGE) ×3 IMPLANT
SYRINGE 10CC LL (SYRINGE) ×3 IMPLANT
TAPE CLOTH SURG 4X10 WHT LF (GAUZE/BANDAGES/DRESSINGS) ×3 IMPLANT
UNDERPAD 30X30 INCONTINENT (UNDERPADS AND DIAPERS) ×3 IMPLANT
WATER STERILE IRR 1000ML POUR (IV SOLUTION) ×3 IMPLANT
WIRE AMPLATZ SS-J .035X180CM (WIRE) IMPLANT

## 2015-02-18 NOTE — Op Note (Addendum)
Procedure: Ultrasound-guided insertion of Diatek catheter, left brachial cephalic AV fistula  Preoperative diagnosis: End-stage renal disease  Postoperative diagnosis: Same  Anesthesia: Local with IV sedation  Operative findings: 23 cm Diatek catheter right internal jugular vein  Operative details: After obtaining informed consent, the patient was taken to the operating room. The patient was placed in supine position on the operating room table. After adequate sedation the patient's entire neck and chest were prepped and draped in usual sterile fashion. The patient was placed in Trendelenburg position. Ultrasound was used to identify the patient's right internal jugular vein. This had normal compressibility and respiratory variation. Local anesthesia was infiltrated over the right jugular vein.  Using ultrasound guidance, the right internal jugular vein was successfully cannulated.  A 0.035 J-tipped guidewire was threaded into the right internal jugular vein and into the superior vena cava followed by the inferior vena cava under fluoroscopic guidance.   Next sequential 12 and 14 dilators were placed over the guidewire into the right atrium.  A 16 French dilator with a peel-away sheath was then placed over the guidewire into the right atrium.   The guidewire and dilator were removed. A 23 cm Diatek catheter was then placed through the peel away sheath into the right atrium.  The catheter was then tunneled subcutaneously, cut to length, and the hub attached. The catheter was noted to flush and draw easily. The catheter was inspected under fluoroscopy and found with its tip to be in the right atrium without any kinks throughout its course. The catheter was sutured to the skin with nylon sutures. The neck insertion site was closed with Vicryl stitch. The catheter was then loaded with concentrated Heparin solution. A dry sterile dressing was applied.   Next, the left upper extremity was prepped and draped  in usual sterile fashion.  A transverse incision was then made near the antecubital crease the left arm. The incision was carried into the subcutaneous tissues down to level of the cephalic vein. The cephalic vein was approximately 3.5 mm in diameter. It was of good quality. This was dissected free circumferentially and small side branches ligated and divided between silk ties or clips. Next the brachial artery was dissected free in the medial portion of the incision. The artery was  3-4 mm in diameter. The vessel loops were placed proximal and distal to the planned site of arteriotomy. The patient was given 5000 units of intravenous heparin. After appropriate circulation time, the vessel loops were used to control the artery. A longitudinal opening was made in the brachial artery.  The vein was ligated distally with a 2-0 silk tie. There was some old thrombus in the vein which I removed.  I also passed a 3 Fogarty catheter up the vein and no further thrombus was retrieved but the Fogarty passed easily through the vein.  The vein was controlled proximally with a fine bulldog clamp. The vein was then swung over to the artery and sewn end of vein to side of artery using a running 7-0 Prolene suture. Just prior to completion of the anastomosis, everything was fore bled back bled and thoroughly flushed. The anastomosis was secured, vessel loops released, and there was a palpable thrill in the fistula immediately. After hemostasis was obtained, the skin edges were reapproximated using 3 0 nylon sutures due to the patient's prior keloid and skin contracture.  The patient had a palpable radial pulse at the end of the case.  The patient tolerated procedure well and there  were no complications. Instrument sponge and needle counts correct in the case. The patient was taken to the recovery room in stable condition. Chest x-ray will be obtained in the recovery room.  Fabienne Bruns, MD [facility] Office: [phone] Pager: [phone]

## 2015-02-18 NOTE — Transfer of Care (Signed)
Immediate Anesthesia Transfer of Care Note  Patient: Gary Rivera  Procedure(s) Performed: Procedure(s): Left Brachial Cephalic ARTERIOVENOUS FISTULA CREATION (Left) INSERTION OF DIALYSIS CATHETER Right Internal Jugular (Right)  Patient Location: PACU  Anesthesia Type:MAC  Level of Consciousness: awake, alert  and oriented  Airway & Oxygen Therapy: Patient Spontanous Breathing and Patient connected to nasal cannula oxygen  Post-op Assessment: Report given to RN, Post -op Vital signs reviewed and stable and Patient moving all extremities X 4  Post vital signs: stable  Last Vitals:  Filed Vitals:   02/18/15 0900 02/18/15 1000  BP: 132/72 122/69  Pulse: 73 76  Temp:    Resp: 24 19    Complications: No apparent anesthesia complications
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
## 2015-02-18 NOTE — Interval H&P Note (Signed)
History and Physical Interval Note:  02/18/2015 12:56 PM  Gary Rivera  has presented today for surgery, with the diagnosis of End Stage Renal Disease N18.6  The various methods of treatment have been discussed with the patient and family. After consideration of risks, benefits and other options for treatment, the patient has consented to  Procedure(s): ARTERIOVENOUS (AV) FISTULA CREATION (Left) INSERTION OF DIALYSIS CATHETER (N/A) as a surgical intervention .  The patient's history has been reviewed, patient examined, no change in status, stable for surgery.  I have reviewed the patient's chart and labs.  Questions were answered to the patient's satisfaction.     Fabienne Bruns

## 2015-02-18 NOTE — Anesthesia Preprocedure Evaluation (Addendum)
Anesthesia Evaluation  Patient identified by MRN, date of birth, ID band Patient awake    Reviewed: Allergy & Precautions, NPO status , Patient's Chart, lab work & pertinent test results  Airway Mallampati: IV  TM Distance: >3 FB Neck ROM: Full   Comment: Large neck, large amount of soft tissue Dental no notable dental hx.    Pulmonary  Acute on chronic respiratory failure Remained intubated for several days    Pulmonary exam normal breath sounds clear to auscultation       Cardiovascular hypertension, +CHF   Rhythm:Regular Rate:Normal + Systolic murmurs Left ventricle: The cavity size was severely dilated. Wall thickness was increased in a pattern of mild LVH. Systolic function was severely reduced. The estimated ejection fraction was in the range of 25% to 30%. Diffuse hypokinesis. - Aortic valve: Mild regurgitation. - Mitral valve: Moderate regurgitation. - Left atrium: The atrium was moderately dilated. - Right ventricle: The cavity size was moderately dilated. - Right atrium: The atrium was mildly to moderately dilated. - Tricuspid valve: Moderate-severe regurgitation. - Pulmonary arteries: Systolic pressure was mildly increased. PA peak pressure: 15mm Hg (S). Impressions:  - Compared to 09/08/11, LV function is worse and TR is now moderate to severe.     Neuro/Psych negative neurological ROS  negative psych ROS   GI/Hepatic negative GI ROS, Neg liver ROS,   Endo/Other  Morbid obesity  Renal/GU Dialysis and ESRFRenal disease  negative genitourinary   Musculoskeletal negative musculoskeletal ROS (+) Arthritis ,   Abdominal   Peds negative pediatric ROS (+)  Hematology  (+) anemia ,   Anesthesia Other Findings Patient with volume overload, significant hypercarbia/hypoxia yesterday requiring non-invasive ventilator support to avoid intubation,, was able to have 3L dialyzed off yesterday and  is currently in a better situation on a few liters of Nasal cannula now needing AV fistula creation and insertion of a dialysis catheter.Marland Kitchen Potential to need intubation and deteriorate at any time but will attempt to avoid airway manipulation if possible for procedure.. Hyperkalemia improved  Reproductive/Obstetrics negative OB ROS                           Anesthesia Physical  Anesthesia Plan  ASA: IV  Anesthesia Plan: MAC   Post-op Pain Management:    Induction: Intravenous  Airway Management Planned: Simple Face Mask  Additional Equipment:   Intra-op Plan:   Post-operative Plan:   Informed Consent: I have reviewed the patients History and Physical, chart, labs and discussed the procedure including the risks, benefits and alternatives for the proposed anesthesia with the patient or authorized representative who has indicated his/her understanding and acceptance.   Dental advisory given  Plan Discussed with: CRNA and Surgeon  Anesthesia Plan Comments: (Precedex, fentanyl, midazolam light sedation for MAC)       Anesthesia Quick Evaluation

## 2015-02-18 NOTE — Consult Note (Signed)
PULMONARY / CRITICAL CARE MEDICINE   Name: Gary Rivera MRN: [id number] DOB: 1960-10-27    ADMISSION DATE:  02/16/2015 CONSULTATION DATE: 11/29  REFERRING MD :  Rozanna Box  CHIEF COMPLAINT: SOB  HISTORY OF PRESENT ILLNESS:   54 yo MO(318 lbs), never smoker, ESRD with missed HD due to clotted left AV graft x 3 days who presented with volume overload, hyperkalemia, hypoxia/hypercarbia and is currently on NIMVS while on HD via rt femoral trialysis cath. PCCM called to bedside for PH 7.2(should correct with HD with volume reduction and NIMVS, Pco2 78). Note he was intubated 01/30/15 and was listed as a difficult intubation. He has an extensive pmh of MO, CHF, PAH, ESRD on home dialysis, anxiety state, and A flutter. He is awake and alert, hemodynamically stable for now and should avoid intubation but with his health history he may worsen at any time. Plan for new AV graft 11/30.  SUBJECTIVE:  No distress smiling in bed  VITAL SIGNS: BP 122/69 mmHg  Pulse 76  Temp(Src) 98.6 F (37 C) (Oral)  Resp 19  Ht  (1.88 m)  Wt 138.8 kg (306 lb)  BMI 39.27 kg/m2  SpO2 98%  HEMODYNAMICS:    VENTILATOR SETTINGS: Vent Mode:  [-] BIPAP FiO2 (%):  [40 %-50 %] 40 % Set Rate:  [24 bmp] 24 bmp PEEP:  [5 cmH20] 5 cmH20  INTAKE / OUTPUT: I/O last 3 completed shifts: In: 223 [P.O.:220; I.V.:3] Out: 2505 [Other:2505]  PHYSICAL EXAMINATION: General: off nimv, no distress Neuro:  Awake and follows commands, mae x 4 HEENT: jvd down, large rt neck keloid  Cardiovascular:  s1 s2 RRR distant  Lungs:Decreased  cta Abdomen:  Obese , + bs Musculoskeletal:  intact Skin: left graft av forearm clotted and without thrill or bruit , lower ext edema  LABS:  CBC  Recent Labs Lab 02/16/15 0504 02/17/15 0500 02/18/15 0530  WBC 8.0 8.1 5.2  HGB 10.6* 10.5* 10.4*  HCT 34.9* 35.6* 35.6*  PLT 124* 139* 133*   Coag's No results for input(s): APTT, INR in the last 168 hours. BMET  Recent  Labs Lab 02/16/15 1305 02/17/15 0500 02/18/15 0530  NA 144 142 139  K 7.1* 5.1 4.4  CL 102 100* 99*  CO2 BUN 151* 80* 64*  CREATININE 30.96* 20.75* 16.82*  GLUCOSE 87 88 80   Electrolytes  Recent Labs Lab 02/16/15 1305 02/17/15 0500 02/18/15 0530  CALCIUM 6.4* 7.0* 7.1*  PHOS 11.3*  --   --    Sepsis Markers No results for input(s): LATICACIDVEN, PROCALCITON, O2SATVEN in the last 168 hours. ABG  Recent Labs Lab 02/17/15 0923 02/17/15 1057 02/17/15 1312  PHART 7.182* 7.275* 7.243*  PCO2ART 78.2* 59.8* 63.5*  PO2ART 96.0 112.0* 53.0*   Liver Enzymes  Recent Labs Lab 02/16/15 1305  ALBUMIN 3.5   Cardiac Enzymes No results for input(s): TROPONINI, PROBNP in the last 168 hours. Glucose No results for input(s): GLUCAP in the last 168 hours.  Imaging No results found.   STUDIES:    CULTURES:   ANTIBIOTICS:   SIGNIFICANT EVENTS: 11/29 resp failure  LINES/TUBES: 11/28 rt fem 3 port hd cath>  DISCUSSION: 54 yo MO(318 lbs), never smoker, ESRD with missed HD due to clotted left AV graft x 3 days who presented with volume overload, hyperkalemia, hypoxia/hypercarbia and is currently on NIMVS while on HD via rt femoral trialysis cath. PCCM called to bedside for PH 7.2(should correct with HD with volume reduction  and NIMVS, Pco2 78). Note he was intubated 01/30/15 and was listed as a difficult intubation. He has an extensive pmh of MO, CHF, PAH, ESRD on home dialysis, anxiety state, and A flutter. He is awake and alert, hemodynamically stable for now and should avoid intubation but with his health history he may worsen at any time. Plan for new AV graft 11/30.  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 Anxiety state P:   normal after ph corrected  FAMILY  - Updates: sister and pt updfated at bedside  - Inter-disciplinary family meet or Palliative Care meeting due by:  day 7  Mcarthur Rossetti. Tyson Alias, MD, FACP Pgr: [phone] [facility]

## 2015-02-19 ENCOUNTER — Encounter (HOSPITAL_COMMUNITY): Payer: Self-pay | Admitting: Vascular Surgery

## 2015-02-19 DIAGNOSIS — J9602 Acute respiratory failure with hypercapnia: Secondary | ICD-10-CM

## 2015-02-19 LAB — CBC
HEMATOCRIT: 32.7 % — AB (ref 39.0–52.0)
HEMOGLOBIN: 9.7 g/dL — AB (ref 13.0–17.0)
MCH: 28.4 pg (ref 26.0–34.0)
MCHC: 29.7 g/dL — ABNORMAL LOW (ref 30.0–36.0)
MCV: 95.9 fL (ref 78.0–100.0)
Platelets: 120 10*3/uL — ABNORMAL LOW (ref 150–400)
RBC: 3.41 MIL/uL — ABNORMAL LOW (ref 4.22–5.81)
RDW: 15.3 % (ref 11.5–15.5)
WBC: 6.8 10*3/uL (ref 4.0–10.5)

## 2015-02-19 LAB — BASIC METABOLIC PANEL
ANION GAP: 16 — AB (ref 5–15)
Anion gap: 16 — ABNORMAL HIGH (ref 5–15)
BUN: 89 mg/dL — ABNORMAL HIGH (ref 6–20)
BUN: 99 mg/dL — ABNORMAL HIGH (ref 6–20)
CALCIUM: 6.1 mg/dL — AB (ref 8.9–10.3)
CHLORIDE: 100 mmol/L — AB (ref 101–111)
CHLORIDE: 99 mmol/L — AB (ref 101–111)
CO2: 23 mmol/L (ref 22–32)
CO2: 23 mmol/L (ref 22–32)
Calcium: 6.5 mg/dL — ABNORMAL LOW (ref 8.9–10.3)
Creatinine, Ser: 19.22 mg/dL — ABNORMAL HIGH (ref 0.61–1.24)
Creatinine, Ser: 20.83 mg/dL — ABNORMAL HIGH (ref 0.61–1.24)
GFR calc non Af Amer: 2 mL/min — ABNORMAL LOW (ref >60)
GFR, EST AFRICAN AMERICAN: 3 mL/min — AB (ref >60)
GFR, EST AFRICAN AMERICAN: 2 mL/min — AB (ref >60)
GFR, EST NON AFRICAN AMERICAN: 2 mL/min — AB (ref >60)
GLUCOSE: 121 mg/dL — AB (ref 65–99)
Glucose, Bld: 96 mg/dL (ref 65–99)
POTASSIUM: 5.1 mmol/L (ref 3.5–5.1)
Potassium: 5.7 mmol/L — ABNORMAL HIGH (ref 3.5–5.1)
SODIUM: 139 mmol/L (ref 135–145)
Sodium: 138 mmol/L (ref 135–145)

## 2015-02-19 MED ORDER — HEPARIN SODIUM (PORCINE) 1000 UNIT/ML DIALYSIS
20.0000 [IU]/kg | INTRAMUSCULAR | Status: DC | PRN
  Administered 2015-02-19: 2800 [IU] via INTRAVENOUS_CENTRAL

## 2015-02-19 NOTE — Progress Notes (Signed)
  [facility] Progress Note   Subjective: doing well, had new AVF and tunneled cath placed yesterday  Filed Vitals:   02/19/15 0619 02/19/15 0700 02/19/15 0800 02/19/15 0928  BP: 103/61 109/56 121/63   Pulse: 79 77 77   Temp:   97.7 F (36.5 C)   TempSrc:   Oral   Resp: 19 19 19    Height:      Weight:   140.57 kg (309 lb 14.4 oz)   SpO2: 99% 96% 100% 99%    Inpatient medications: . heparin  5,000 Units Subcutaneous 3 times per day  . ipratropium-albuterol  3 mL Nebulization Q6H  . lanthanum  2,000 mg Oral TID WC  . metoCLOPramide  10 mg Oral Daily  . pantoprazole  40 mg Oral Daily  . sodium chloride  3 mL Intravenous Q12H     sodium chloride, sodium chloride, acetaminophen, alteplase, clonazePAM, heparin, heparin, lidocaine (PF), lidocaine-prilocaine, oxyCODONE, pentafluoroprop-tetrafluoroeth  Exam: Alert, sonorous respirations No jvd Chest clear bilat RRR no RG ABd obese soft ntnd no ascites No LE edema LUA AVF +bruit, IJ cath Neuro alert ox 3     Home HD  MTTF   3.5h  139.5kg   2k bath   Heparin 5000  LFA AVF Calcitriol 1.25 ug q day Labs pth 738 Hb 12.2      Assessment: 1 Hyperkalemia resolved 2 AV fistula failure , sp new AVF and cath 3 ESRD was home HD , going back to in-center HD at [facility] due to nonadherence 4 OSA/ OHS 5 MBD cont meds 6 Anemia no esa, Hb good 7 HTN no home bp meds  Plan - HD today. He can go home after HD today. I am in process of getting him an OP HD slot and will update later today.    [name] MD [facility] pager [phone]    cell [phone] 02/19/2015, 10:03 AM    Recent Labs Lab 02/16/15 1305 02/17/15 0500 02/18/15 0530 02/19/15 0254  NA 144 142 139 139  K 7.1* 5.1 4.4 5.1  CL 102 100* 99* 100*  CO2 23 27 27 23   GLUCOSE 87 88 80 96  BUN 151* 80* 64* 89*  CREATININE 30.96* 20.75* 16.82* 19.22*  CALCIUM 6.4* 7.0* 7.1* 6.5*  PHOS 11.3*  --   --   --     Recent Labs Lab  02/16/15 1305  ALBUMIN 3.5    Recent Labs Lab 02/16/15 0504 02/17/15 0500 02/18/15 0530  WBC 8.0 8.1 5.2  NEUTROABS 5.8  --   --   HGB 10.6* 10.5* 10.4*  HCT 34.9* 35.6* 35.6*  MCV 96.4 97.0 96.7  PLT 124* 139* 133*

## 2015-02-19 NOTE — Progress Notes (Signed)
CRITICAL VALUE ALERT  Critical value received:  CA 6.1 Date of notification:  02/19/2015  Time of notification:  1455  Critical value read back:Yes.    Nurse who received alert:  Steva Ready  MD notified (1st page):  DR. Arlean Hopping  Time of first page:  1500  [place]ahn[place] to 2K 3.5 CA increase to 4.5 hr

## 2015-02-19 NOTE — Progress Notes (Signed)
[facility] [place]  Subjective  - feels ok   Objective 103/61 79 98 F (36.7 C) (Axillary) 19 99%  Intake/Output Summary (Last 24 hours) at 02/19/15 6010 Last data filed at 02/18/15 1900  Gross per 24 hour  Intake    548 ml  Output      5 ml  Net    543 ml   + thrill in fistula Minimal drainage from keloid area No hematoma No numbness in hand  Chest xray catheter in good position  Assessment/Planning: Left arm AV fistula patent Follow up 2 weeks for suture removal  Fabienne Bruns 02/19/2015 8:06 AM --  Laboratory Lab Results:  Recent Labs  02/17/15 0500 02/18/15 0530  WBC 8.1 5.2  HGB 10.5* 10.4*  HCT 35.6* 35.6*  PLT 139* 133*   BMET  Recent Labs  02/18/15 0530 02/19/15 0254  NA 139 139  K 4.4 5.1  CL 99* 100*  CO2 27 23  GLUCOSE 80 96  BUN 64* 89*  CREATININE 16.82* 19.22*  CALCIUM 7.1* 6.5*    COAG Lab Results  Component Value Date   INR 1.11 05/21/2014   INR 1.15 05/20/2014   INR 1.11 05/19/2014   No results found for: PTT

## 2015-02-19 NOTE — Progress Notes (Signed)
Confirmation received that Gary Rivera is scheduled to begin outpatient hemodialysis at [facility] on Dec 03,2016 at 12:15pm

## 2015-02-19 NOTE — Progress Notes (Signed)
Home CPAP was not arranged prior to discharged. Dr. Wyatt Portela notified who in turn notified Dr. Tyson Alias. Dr. Tyson Alias stated the patient could go home and home CPAP will be arranged out patient. Pt was instructed to call the Pulmonary office tomorrow per Dr. Tyson Alias in order to set up home CPAP and arranged sleep study. Pt verbalized understanding of all instructions given. Pt was educated on all medications and oxygen. Pt verbalized understanding of all instructions. Pt's family did not bring oxygen for the pt to be transported home. Pt states that he "doesnt use oxygen when he is in and out". Pt states he lives 5 minutes from the hospital and does not need oxygen for the ride home.

## 2015-02-19 NOTE — Care Management Important Message (Signed)
Important Message  Patient Details  Name: Gary Rivera MRN: [id number] Date of Birth: 1960-06-30   Medicare Important Message Given:  Yes    Doretta Remmert P Vadim Centola 02/19/2015, 1:16 PM

## 2015-02-19 NOTE — Discharge Summary (Signed)
Physician Discharge Summary  Patient ID: Gary Rivera MRN: [id number] DOB/AGE: 55/25/1962 55 y.o.  Admit date: 02/16/2015 Discharge date: 02/19/2015    Discharge Diagnoses:  Suspected OHS OSA - on 2L O2  Acute Hypercarbic Respiratory Failure Volume Overload CHF  NICM with LVEF 45% ESRD  Hyperkalemia  AV Fistula Placement  Morbid Obesity Anxiety                                                                        DISCHARGE PLAN BY DIAGNOSIS     Suspected OHS  OSA on nocturnal O2 - 2L at baseline  Acute Hypercarbic Respiratory Failure Volume overload PAH   Discharge Plan: Arrange for auto-titration CPAP at discharge  Follow up with Pulmonary, have initiated the process to set up for a sleep study.   Sleep MD consult arranged for 05/2015 Increase O2 to 3L at home, 24/7  CHF  Hx Atrial Flutter NICM with LVEF 45%  Discharge Plan:  Volume control per HD Follow up with Cardiology as previously scheduled    ESRD - previously on home HD, now transitioning to Center provided HD due to non-compliance  Placement of Diatek Catheter & New L AVF Hyperkalemia Volume Overload Hx of Peritoneal HD with Sepsis   Discharge Plan:  Follow up with Nephrology as an outpatient  New HD schedule T/Th/S - next appointment 12/3 at 12:15 PM Follow up with Dr. Fields in two weeks for suture removal.  Office will call him for an appointment.   Perm cath removal timing per Nephrology / [facility]  Morbid Obesity    Discharge Plan:  Renal diet as tolerated    Anxiety  Insomnia  Chronic Pain   Discharge Plan:  Resume home klonopin, oxycodone & elavil                      DISCHARGE SUMMARY   Gary Rivera is a 54 y.o. y/o male, never smoker, with a PMH of morbid obesity, anxiety, NICM, chronic systolic / diastolic CHF with LVEF of 25%, OSA on nocturnal oxygen, OHS, pulmonary hypertension, reported hx of difficult intubation, and ESRD on home HD who was admitted  11/28 with volume overload and a malfunctioning fistula.    The patient was previously on peritoneal dialysis (failed) and more recently on home IHD. Unfortunately, he has recurrent problems with his left forearm fistula. The patient reports he usually does dialysis 4-5 times per week, MWF S.  On 11/23, he noted during dialysis he felt like his fistula was "petering out", but he is able to finish the session. Friday 11/25 when he cannulated the fistula, he got "basically nothing", and when he called the dialysis office they said it'd probably clotted off.  On 11/28 the patient felt no thrill at all and so he came to the ER for evaluation.  In the ED, the patient was tachypneic and hypoxic.  He required aerosol mask to maintain his oxygen saturation. His potassium was 6, creatinine was 30, and BUN was 145. Nephrology evaluated the patient and recommend HD. Kayexalate and calcium gluconate were administered for hyperkalemia. TRH were asked to admit for observation.  Vascular surgery was consulted for AV fistula evaluation.  The patient developed worsening   of respiratory acidosis (ph 7.15 / 72) and was placed on BiPAP support.  PCCM was consulted 11/29 for evaluation. BiPAP support was adjusted with improvement in respiratory acidosis.  On 11/30, he underwent placement of an ultrasound guided Diatek catheter and placement of left brachial cephalic AV fistula placement per Dr. Oneida Alar.  Volume removal achieved via hemodialysis.  The patient tolerated placement of AVF well.  He completed HD on 12/1 without difficulty and was medically cleared for discharge with plans as above.           CONSULTS Vascular Surgery - Dr. Oneida Alar  Nephrology - Dr. Jonnie Finner   Discharge Exam: General: obese male in NAD  Neuro: AAOx4, speech clear, MAE, normal strength CV: s1s2 rrr, no m/r/g PULM: resp's even/non-labored, lungs bilaterally clear GI: obese/soft, bsx4 active  Extremities: no acute deformities, LUE AVF with + thrill  / bruit, L IJ HD perm cath   Filed Vitals:   02/19/15 1415 02/19/15 1430 02/19/15 1500 02/19/15 1527  BP: 102/52 108/56 107/51 106/53  Pulse: 80 73 76 83  Temp:      TempSrc:      Resp:      Height:      Weight:      SpO2:         Discharge Labs  BMET  Recent Labs Lab 02/16/15 1305 02/17/15 0500 02/18/15 0530 02/19/15 0254 02/19/15 1406  NA 144 142 139 139 138  K 7.1* 5.1 4.4 5.1 5.7*  CL 102 100* 99* 100* 99*  CO2 _0 GLUCOSE 87 88 80 96 121*  BUN 151* 80* 64* 89* 99*  CREATININE 30.96* 20.75* 16.82* 19.22* 20.83*  CALCIUM 6.4* 7.0* 7.1* 6.5* 6.1*  PHOS 11.3*  --   --   --   --    CBC  Recent Labs Lab 02/17/15 0500 02/18/15 0530 02/19/15 1406  HGB 10.5* 10.4* 9.7*  HCT 35.6* 35.6* 32.7*  WBC 8.1 5.2 6.8  PLT 139* 133* 120*        Follow-up Information    Follow up with Ruta Hinds, MD In 2 weeks.   Specialties:  Vascular Surgery, Cardiology   Why:  Our office will call you to arrange an appointment (sent)   Contact information:   [address] [place] [address] [phone]       Follow up with Antonietta Jewel, MD.   Specialty:  Internal Medicine   Why:  As needed   Contact information:   [address] [place] [place] [address] [phone]       Follow up with PARRETT,TAMMY, NP On 03/10/2015.   Specialty:  Pulmonary Disease   Why:  Appt at 10:15 for hospital follow up.    Contact information:   [address] [address] [phone]       Follow up with Deneise Lever, MD On 06/04/2015.   Specialty:  Pulmonary Disease   Why:  Appt at 9:45    Contact information:   [place] [address] [phone]       Follow up with Hemodialysis at [facility] On 02/21/2015.   Why:  Appt at 12:15          Medication List    TAKE these medications        albuterol 108 (90 BASE) MCG/ACT inhaler  Commonly known as:  PROVENTIL HFA;VENTOLIN HFA  Inhale 1 puff into the lungs every 6 (six)  hours as needed for wheezing  or shortness of breath.     amitriptyline 25 MG tablet  Commonly known as:  ELAVIL  Take 25 mg by mouth at bedtime.     clonazePAM 0.5 MG tablet  Commonly known as:  KLONOPIN  Take 1 mg by mouth 2 (two) times daily as needed for anxiety.     lanthanum 1000 MG chewable tablet  Commonly known as:  FOSRENOL  Chew 2 tablets (2,000 mg total) by mouth 3 (three) times daily with meals.     metoCLOPramide 10 MG tablet  Commonly known as:  REGLAN  Take 10 mg by mouth daily.     multivitamin Tabs tablet  Take 1 tablet by mouth at bedtime.     Omega-3 1000 MG Caps  Take 1 g by mouth 2 (two) times daily.     omeprazole 10 MG capsule  Commonly known as:  PRILOSEC  Take 10 mg by mouth daily.     Oxycodone HCl 10 MG Tabs  Take 1 tablet (10 mg total) by mouth 3 (three) times daily as needed.     pantoprazole 40 MG tablet  Commonly known as:  PROTONIX  Take 1 tablet (40 mg total) by mouth daily.     vitamin E 400 UNIT capsule  Take 400 Units by mouth daily.          Disposition:  Home.  Outpatient HD arranged at [facility].    Discharged Condition: Gary Rivera has met maximum benefit of inpatient care and is medically stable and cleared for discharge.  Patient is pending follow up as above.      Time spent on disposition:  Greater than 35 minutes.   Signed: Noe Gens, NP-C [facility] Pgr: [phone] Office: [phone]

## 2015-02-19 NOTE — Progress Notes (Signed)
Pt taken to discharge area via [facility] and assisted into the vehicle.

## 2015-02-22 NOTE — Anesthesia Postprocedure Evaluation (Signed)
Anesthesia Post Note  Patient: Gary Rivera  Procedure(s) Performed: Procedure(s) (LRB): Left Brachial Cephalic ARTERIOVENOUS FISTULA CREATION (Left) INSERTION OF DIALYSIS CATHETER Right Internal Jugular (Right)  Patient location during evaluation: PACU Anesthesia Type: MAC Level of consciousness: awake and alert and awake Pain management: pain level controlled Vital Signs Assessment: post-procedure vital signs reviewed and stable Anesthetic complications: no    Last Vitals:  Filed Vitals:   02/19/15 1730 02/19/15 1759  BP: 108/57 102/52  Pulse: 86 77  Temp:  36.9 C  Resp:  20    Last Pain:  Filed Vitals:   02/19/15 1800  PainSc: 0-No pain                 Ohanna Gassert COKER

## 2015-02-23 ENCOUNTER — Emergency Department (HOSPITAL_COMMUNITY): Payer: Medicare Other

## 2015-02-23 ENCOUNTER — Encounter (HOSPITAL_COMMUNITY): Payer: Self-pay | Admitting: *Deleted

## 2015-02-23 ENCOUNTER — Telehealth: Payer: Self-pay | Admitting: Vascular Surgery

## 2015-02-23 ENCOUNTER — Inpatient Hospital Stay (HOSPITAL_COMMUNITY): Payer: Medicare Other

## 2015-02-23 ENCOUNTER — Inpatient Hospital Stay (HOSPITAL_COMMUNITY)
Admission: RE | Admit: 2015-02-23 | Discharge: 2015-02-26 | DRG: 091 | Disposition: A | Discharge status: Home Health | Payer: Medicare Other | Attending: Internal Medicine | Admitting: Internal Medicine

## 2015-02-23 DIAGNOSIS — I272 Other secondary pulmonary hypertension: Secondary | ICD-10-CM | POA: Diagnosis present

## 2015-02-23 DIAGNOSIS — J9601 Acute respiratory failure with hypoxia: Secondary | ICD-10-CM | POA: Diagnosis present

## 2015-02-23 DIAGNOSIS — N186 End stage renal disease: Secondary | ICD-10-CM | POA: Diagnosis not present

## 2015-02-23 DIAGNOSIS — I5042 Chronic combined systolic (congestive) and diastolic (congestive) heart failure: Secondary | ICD-10-CM | POA: Diagnosis present

## 2015-02-23 DIAGNOSIS — G253 Myoclonus: Secondary | ICD-10-CM | POA: Diagnosis present

## 2015-02-23 DIAGNOSIS — D631 Anemia in chronic kidney disease: Secondary | ICD-10-CM | POA: Diagnosis present

## 2015-02-23 DIAGNOSIS — Z6841 Body Mass Index (BMI) 40.0 and over, adult: Secondary | ICD-10-CM

## 2015-02-23 DIAGNOSIS — D696 Thrombocytopenia, unspecified: Secondary | ICD-10-CM | POA: Diagnosis present

## 2015-02-23 DIAGNOSIS — Z8249 Family history of ischemic heart disease and other diseases of the circulatory system: Secondary | ICD-10-CM | POA: Diagnosis not present

## 2015-02-23 DIAGNOSIS — G9340 Encephalopathy, unspecified: Secondary | ICD-10-CM

## 2015-02-23 DIAGNOSIS — E162 Hypoglycemia, unspecified: Secondary | ICD-10-CM | POA: Diagnosis present

## 2015-02-23 DIAGNOSIS — F411 Generalized anxiety disorder: Secondary | ICD-10-CM | POA: Diagnosis present

## 2015-02-23 DIAGNOSIS — E662 Morbid (severe) obesity with alveolar hypoventilation: Secondary | ICD-10-CM | POA: Diagnosis not present

## 2015-02-23 DIAGNOSIS — I132 Hypertensive heart and chronic kidney disease with heart failure and with stage 5 chronic kidney disease, or end stage renal disease: Secondary | ICD-10-CM | POA: Diagnosis present

## 2015-02-23 DIAGNOSIS — D649 Anemia, unspecified: Secondary | ICD-10-CM | POA: Diagnosis present

## 2015-02-23 DIAGNOSIS — R0689 Other abnormalities of breathing: Secondary | ICD-10-CM

## 2015-02-23 DIAGNOSIS — G92 Toxic encephalopathy: Secondary | ICD-10-CM | POA: Diagnosis present

## 2015-02-23 DIAGNOSIS — F329 Major depressive disorder, single episode, unspecified: Secondary | ICD-10-CM | POA: Diagnosis present

## 2015-02-23 DIAGNOSIS — I429 Cardiomyopathy, unspecified: Secondary | ICD-10-CM | POA: Diagnosis not present

## 2015-02-23 DIAGNOSIS — I5022 Chronic systolic (congestive) heart failure: Secondary | ICD-10-CM | POA: Diagnosis not present

## 2015-02-23 DIAGNOSIS — I428 Other cardiomyopathies: Secondary | ICD-10-CM

## 2015-02-23 DIAGNOSIS — D500 Iron deficiency anemia secondary to blood loss (chronic): Secondary | ICD-10-CM | POA: Diagnosis not present

## 2015-02-23 DIAGNOSIS — Z992 Dependence on renal dialysis: Secondary | ICD-10-CM

## 2015-02-23 DIAGNOSIS — Z833 Family history of diabetes mellitus: Secondary | ICD-10-CM

## 2015-02-23 DIAGNOSIS — I10 Essential (primary) hypertension: Secondary | ICD-10-CM | POA: Diagnosis present

## 2015-02-23 DIAGNOSIS — E669 Obesity, unspecified: Secondary | ICD-10-CM | POA: Diagnosis present

## 2015-02-23 DIAGNOSIS — I4892 Unspecified atrial flutter: Secondary | ICD-10-CM | POA: Diagnosis present

## 2015-02-23 LAB — COMPREHENSIVE METABOLIC PANEL
ALBUMIN: 3.5 g/dL (ref 3.5–5.0)
ALT: 17 U/L (ref 17–63)
ANION GAP: 14 (ref 5–15)
AST: 19 U/L (ref 15–41)
Alkaline Phosphatase: 65 U/L (ref 38–126)
BILIRUBIN TOTAL: 0.6 mg/dL (ref 0.3–1.2)
BUN: 50 mg/dL — AB (ref 6–20)
CHLORIDE: 99 mmol/L — AB (ref 101–111)
CO2: 25 mmol/L (ref 22–32)
Calcium: 8.9 mg/dL (ref 8.9–10.3)
Creatinine, Ser: 14.19 mg/dL — ABNORMAL HIGH (ref 0.61–1.24)
GFR calc Af Amer: 4 mL/min — ABNORMAL LOW (ref >60)
GFR calc non Af Amer: 3 mL/min — ABNORMAL LOW (ref >60)
GLUCOSE: 88 mg/dL (ref 65–99)
POTASSIUM: 4.3 mmol/L (ref 3.5–5.1)
SODIUM: 138 mmol/L (ref 135–145)
TOTAL PROTEIN: 7.5 g/dL (ref 6.5–8.1)

## 2015-02-23 LAB — CBC
HEMATOCRIT: 34.4 % — AB (ref 39.0–52.0)
HEMOGLOBIN: 10 g/dL — AB (ref 13.0–17.0)
MCH: 27.7 pg (ref 26.0–34.0)
MCHC: 29.1 g/dL — AB (ref 30.0–36.0)
MCV: 95.3 fL (ref 78.0–100.0)
Platelets: 137 10*3/uL — ABNORMAL LOW (ref 150–400)
RBC: 3.61 MIL/uL — ABNORMAL LOW (ref 4.22–5.81)
RDW: 15.2 % (ref 11.5–15.5)
WBC: 5.6 10*3/uL (ref 4.0–10.5)

## 2015-02-23 LAB — AMMONIA: Ammonia: 20 umol/L (ref 9–35)

## 2015-02-23 LAB — I-STAT ARTERIAL BLOOD GAS, ED
Acid-base deficit: 2 mmol/L (ref 0.0–2.0)
Bicarbonate: 24.8 mEq/L — ABNORMAL HIGH (ref 20.0–24.0)
O2 SAT: 90 %
TCO2: 26 mmol/L (ref 0–100)
pCO2 arterial: 49.7 mmHg — ABNORMAL HIGH (ref 35.0–45.0)
pH, Arterial: 7.306 — ABNORMAL LOW (ref 7.350–7.450)
pO2, Arterial: 64 mmHg — ABNORMAL LOW (ref 80.0–100.0)

## 2015-02-23 LAB — CBG MONITORING, ED
Glucose-Capillary: 75 mg/dL (ref 65–99)
Glucose-Capillary: 71 mg/dL (ref 65–99)

## 2015-02-23 NOTE — ED Provider Notes (Signed)
CSN: [id number]     Arrival date & time 02/23/15  1832 History   First MD Initiated Contact with Patient 02/23/15 1910     Chief Complaint  Patient presents with  . Altered Mental Status   Patient is a 54 y.o. male presenting with altered mental status. The history is provided by the patient and the EMS personnel.  Altered Mental Status Presenting symptoms: behavior changes and confusion   Severity:  Moderate Most recent episode:  Today Timing:  Constant Chronicity:  New Context: not alcohol use, not drug use and not a recent infection   Associated symptoms: no abdominal pain, no fever, no headaches, no nausea, no rash and no vomiting     Past Medical History  Diagnosis Date  . ESRD on hemodialysis (HCC)     Started dialysis with PD in 2013 and switched to HD in spring 2014 because of fungal peritonitis.  Takes dialysis at home 5 days a week approx 3h 15 min each session.     . CHF (congestive heart failure) (HCC)   . Peritoneal dialysis catheter in place [facility])   . Anemia     patient reporats that last hgb 7.9 a week ago  . Anxiety state, unspecified   . Hypertension    Past Surgical History  Procedure Laterality Date  . Appendectomy    . Peritoneal catheter insertion    . Tee without cardioversion N/A 07/11/2013    Procedure: TRANSESOPHAGEAL ECHOCARDIOGRAM (TEE);  Surgeon: Pricilla Riffle, MD;  Location: [facility] ENDOSCOPY;  Service: Cardiovascular;  Laterality: N/A;  . Cardioversion N/A 07/11/2013    Procedure: CARDIOVERSION;  Surgeon: Pricilla Riffle, MD;  Location: [facility] ENDOSCOPY;  Service: Cardiovascular;  Laterality: N/A;  . Knee arthroscopy Right 05/18/2014    Procedure: ARTHROSCOPIC WASHOUT OF RIGHT KNEE;  Surgeon: Sheral Apley, MD;  Location: [facility] OR;  Service: Orthopedics;  Laterality: Right;  . Av fistula placement Left 02/18/2015    Procedure: Left Brachial Cephalic ARTERIOVENOUS FISTULA CREATION;  Surgeon: Sherren Kerns, MD;  Location: [facility] OR;  Service: Vascular;  Laterality:  Left;  . Insertion of dialysis catheter Right 02/18/2015    Procedure: INSERTION OF DIALYSIS CATHETER Right Internal Jugular;  Surgeon: Sherren Kerns, MD;  Location[facility] OR;  Service: Vascular;  Laterality: Right;   Family History  Problem Relation Age of Onset  . Diabetes Mother   . Hypertension Mother   . Hypertension Sister   . Asthma Sister    Social History  Substance Use Topics  . Smoking status: Never Smoker   . Smokeless tobacco: Never Used  . Alcohol Use: No    Review of Systems  Constitutional: Negative for fever and chills.  HENT: Negative for rhinorrhea and sore throat.   Eyes: Negative for visual disturbance.  Respiratory: Negative for cough and shortness of breath.   Cardiovascular: Negative for chest pain.  Gastrointestinal: Negative for nausea, vomiting, abdominal pain, diarrhea and constipation.  Genitourinary: Negative for dysuria and hematuria.  Musculoskeletal: Negative for back pain and neck pain.  Skin: Negative for rash.  Neurological: Positive for tremors. Negative for syncope and headaches.  Psychiatric/Behavioral: Positive for confusion.  All other systems reviewed and are negative.  Allergies  Renvela  Home Medications   Prior to Admission medications   Medication Sig Start Date End Date Taking? Authorizing Provider  amitriptyline (ELAVIL) 25 MG tablet Take 25 mg by mouth at bedtime.   Yes Historical Provider, MD  clonazePAM (KLONOPIN) 0.5 MG tablet Take 1  mg by mouth 2 (two) times daily as needed for anxiety.    Yes Historical Provider, MD  lanthanum (FOSRENOL) 1000 MG chewable tablet Chew 2 tablets (2,000 mg total) by mouth 3 (three) times daily with meals. 02/05/15  Yes Drema Dallas, MD  metoCLOPramide (REGLAN) 10 MG tablet Take 10 mg by mouth daily. 05/03/14  Yes Historical Provider, MD  multivitamin (RENA-VIT) TABS tablet Take 1 tablet by mouth at bedtime. 05/21/14  Yes Jessica Upchurch Vann, DO  Omega-3 1000 MG CAPS Take 1 g by mouth 2  (two) times daily.   Yes Historical Provider, MD  omeprazole (PRILOSEC) 20 MG capsule Take 20 mg by mouth daily. 02/10/15  Yes Historical Provider, MD  Oxycodone HCl 10 MG TABS Take 1 tablet (10 mg total) by mouth 3 (three) times daily as needed. Patient taking differently: Take 10 mg by mouth 3 (three) times daily as needed (pain).  05/21/14  Yes Charlette Caffey Vann, DO  pantoprazole (PROTONIX) 40 MG tablet Take 1 tablet (40 mg total) by mouth daily. 02/05/15  Yes Drema Dallas, MD  vitamin E 400 UNIT capsule Take 400 Units by mouth daily.   Yes Historical Provider, MD  albuterol (PROVENTIL HFA;VENTOLIN HFA) 108 (90 BASE) MCG/ACT inhaler Inhale 1 puff into the lungs every 6 (six) hours as needed for wheezing or shortness of breath.    Historical Provider, MD   BP 120/63 mmHg  Pulse 91  Temp(Src) 98.1 F (36.7 C) (Oral)  Resp 24  SpO2 94% Physical Exam  Constitutional: He is oriented to person, place, and time. He appears well-developed and well-nourished. No distress.  Occasional jerking movements  HENT:  Head: Normocephalic and atraumatic.  Mouth/Throat: Oropharynx is clear and moist.  Eyes: EOM are normal.  Neck: Neck supple. No JVD present.  Cardiovascular: Normal rate, regular rhythm, normal heart sounds and intact distal pulses.   Pulmonary/Chest: Effort normal and breath sounds normal.  Abdominal: Soft. He exhibits no distension. There is no tenderness.  Musculoskeletal: Normal range of motion. He exhibits no edema.  Neurological: He is alert and oriented to person, place, and time. No cranial nerve deficit or sensory deficit. GCS eye subscore is 4. GCS verbal subscore is 4. GCS motor subscore is 6.  Generalized weakness. Unable to focus on following commands. For example, during finger-nose, patient repeatedly touches his nose. Unsteady when standing and unsteady gait.  Skin: Skin is warm and dry.  Psychiatric: His behavior is normal.  Flat affect    ED Course  Procedures   None   Labs Review Labs Reviewed  COMPREHENSIVE METABOLIC PANEL - Abnormal; Notable for the following:    Chloride 99 (*)    BUN 50 (*)    Creatinine, Ser 14.19 (*)    GFR calc non Af Amer 3 (*)    GFR calc Af Amer 4 (*)    All other components within normal limits  CBC - Abnormal; Notable for the following:    RBC 3.61 (*)    Hemoglobin 10.0 (*)    HCT 34.4 (*)    MCHC 29.1 (*)    Platelets 137 (*)    All other components within normal limits  I-STAT ARTERIAL BLOOD GAS, ED - Abnormal; Notable for the following:    pH, Arterial 7.306 (*)    pCO2 arterial 49.7 (*)    pO2, Arterial 64.0 (*)    Bicarbonate 24.8 (*)    All other components within normal limits  AMMONIA  URINALYSIS, ROUTINE W  REFLEX MICROSCOPIC (NOT AT [facility])  URINE RAPID DRUG SCREEN, HOSP PERFORMED  BLOOD GAS, ARTERIAL  CBG MONITORING, ED  CBG MONITORING, ED    Imaging Review Ct Head Wo Contrast  02/23/2015  CLINICAL DATA:  Altered mental status EXAM: CT HEAD WITHOUT CONTRAST TECHNIQUE: Contiguous axial images were obtained from the base of the skull through the vertex without intravenous contrast. COMPARISON:  None. FINDINGS: Bony calvarium is intact. No findings to suggest acute hemorrhage, acute infarction or space-occupying mass lesion are noted. Mild motion artifact is noted. IMPRESSION: No acute abnormality noted. Electronically Signed   By: Alcide Clever M.D.   On: 02/23/2015 20:47   I have personally reviewed and evaluated these images and lab results as part of my medical decision-making.  MDM   Final diagnoses:  Encephalopathy  Hypercarbia   54 yo M with ESRD on HD, CHF, anemia and HTN who presents by EMS for AMS. Onset during dialysis. Noted to be hypoglycemic to 50s - given D50 - improved to 90s. Patient is alert but confused. He has difficulty following simple commands (finger-nose test). Also is having occasional myoclonic jerking. Family says he is "confused" at baseline with some jerking. No  recent head imaging. Will get CT in addition to labs.   Labs largely unremarkable. I spoke with the patient's sister, Kendal Hymen, who states that at baseline the patient is intelligent with fluent speech and has recently having more jerking movements over the last 3-4 weeks. On reassessment the patient is very slow to answer questions. He was unsteady with standing and appears to have some difficulty with ambulation. CT head is negative for acute cranial abnormality. Sister states she will attempt to come to the ED. Do not feel patient is at baseline. We will obtain urine drug screen and ammonia level but feel he'll need admission for encephalopathy. Ammonia wnl. ABG with chronic hypercarbia. Spoke with hospitalist about the case. He is agreeable with admission.   Discussed with Dr. Corlis Leak.  Maris Berger, MD 02/24/15 0008  Courteney Randall An, MD 02/24/15 6283

## 2015-02-23 NOTE — Telephone Encounter (Signed)
LM or pt re appt, dpm °

## 2015-02-23 NOTE — ED Notes (Signed)
Pt arrives via EMS from dialysis. EMS called out doe to AMS. CBG upon arrival was 56. Oral glucose given, CBG on recheck was 93. Pt has hx sleep apnea, pt in and out of sleep but alert to verbal.

## 2015-02-23 NOTE — ED Notes (Signed)
Dr. Margreta Journey at bedside. Stood patient up and had him try to ambulate. Only able to take 3 steps and asked to sit back down.

## 2015-02-23 NOTE — ED Notes (Signed)
Patient transported to X-ray 

## 2015-02-23 NOTE — Telephone Encounter (Signed)
-----   Message from Sharee Pimple, RN sent at 02/19/2015 10:26 AM EST ----- Regarding: schedule   ----- Message -----    From: Raymond Gurney, PA-C    Sent: 02/19/2015   8:08 AM      To: [facility] Charge Pool  S/p left brachial-cephalic AV fistula 02/18/15  F/u with Fields in 2 weeks for suture removal.   Thanks Selena Batten

## 2015-02-23 NOTE — ED Notes (Signed)
CBG 75. °

## 2015-02-24 ENCOUNTER — Encounter (HOSPITAL_COMMUNITY): Payer: Self-pay | Admitting: Family Medicine

## 2015-02-24 ENCOUNTER — Telehealth: Payer: Self-pay | Admitting: Adult Health

## 2015-02-24 DIAGNOSIS — D696 Thrombocytopenia, unspecified: Secondary | ICD-10-CM

## 2015-02-24 DIAGNOSIS — D500 Iron deficiency anemia secondary to blood loss (chronic): Secondary | ICD-10-CM

## 2015-02-24 DIAGNOSIS — I10 Essential (primary) hypertension: Secondary | ICD-10-CM

## 2015-02-24 DIAGNOSIS — E669 Obesity, unspecified: Secondary | ICD-10-CM

## 2015-02-24 DIAGNOSIS — I272 Other secondary pulmonary hypertension: Secondary | ICD-10-CM

## 2015-02-24 LAB — CBC
HEMATOCRIT: 32.9 % — AB (ref 39.0–52.0)
HEMOGLOBIN: 10.1 g/dL — AB (ref 13.0–17.0)
MCH: 28.9 pg (ref 26.0–34.0)
MCHC: 30.7 g/dL (ref 30.0–36.0)
MCV: 94.3 fL (ref 78.0–100.0)
Platelets: 138 10*3/uL — ABNORMAL LOW (ref 150–400)
RBC: 3.49 MIL/uL — ABNORMAL LOW (ref 4.22–5.81)
RDW: 15 % (ref 11.5–15.5)
WBC: 6 10*3/uL (ref 4.0–10.5)

## 2015-02-24 LAB — COMPREHENSIVE METABOLIC PANEL
ALBUMIN: 3.2 g/dL — AB (ref 3.5–5.0)
ALT: 15 U/L — ABNORMAL LOW (ref 17–63)
ANION GAP: 16 — AB (ref 5–15)
AST: 17 U/L (ref 15–41)
Alkaline Phosphatase: 61 U/L (ref 38–126)
BILIRUBIN TOTAL: 0.6 mg/dL (ref 0.3–1.2)
BUN: 67 mg/dL — AB (ref 6–20)
CHLORIDE: 97 mmol/L — AB (ref 101–111)
CO2: 24 mmol/L (ref 22–32)
Calcium: 7.9 mg/dL — ABNORMAL LOW (ref 8.9–10.3)
Creatinine, Ser: 16.52 mg/dL — ABNORMAL HIGH (ref 0.61–1.24)
GFR calc Af Amer: 3 mL/min — ABNORMAL LOW (ref >60)
GFR, EST NON AFRICAN AMERICAN: 3 mL/min — AB (ref >60)
Glucose, Bld: 103 mg/dL — ABNORMAL HIGH (ref 65–99)
POTASSIUM: 5.3 mmol/L — AB (ref 3.5–5.1)
Sodium: 137 mmol/L (ref 135–145)
TOTAL PROTEIN: 6.7 g/dL (ref 6.5–8.1)

## 2015-02-24 LAB — GLUCOSE, CAPILLARY
GLUCOSE-CAPILLARY: 93 mg/dL (ref 65–99)
GLUCOSE-CAPILLARY: 93 mg/dL (ref 65–99)
GLUCOSE-CAPILLARY: 127 mg/dL — AB (ref 65–99)
GLUCOSE-CAPILLARY: 81 mg/dL (ref 65–99)
Glucose-Capillary: 69 mg/dL (ref 65–99)

## 2015-02-24 LAB — CBG MONITORING, ED
GLUCOSE-CAPILLARY: 66 mg/dL (ref 65–99)
Glucose-Capillary: 77 mg/dL (ref 65–99)

## 2015-02-24 MED ORDER — DARBEPOETIN ALFA 100 MCG/0.5ML IJ SOSY: 100.0000 ug | PREFILLED_SYRINGE | INTRAMUSCULAR | Status: DC

## 2015-02-24 MED ORDER — HEPARIN 1000 UNIT/ML FOR PERITONEAL DIALYSIS
5000.0000 [IU] | Freq: Once | INTRAMUSCULAR | Status: AC
  Administered 2015-02-24: 5000 [IU] via INTRAVENOUS_CENTRAL

## 2015-02-24 MED ORDER — CLONAZEPAM 1 MG PO TABS
1.0000 mg | ORAL_TABLET | Freq: Two times a day (BID) | ORAL | Status: DC | PRN
  Administered 2015-02-25: 1 mg via ORAL
  Filled 2015-02-24: qty 1

## 2015-02-24 MED ORDER — SODIUM CHLORIDE 0.9 % IV SOLN: 50.0000 mg | INTRAVENOUS | Status: DC

## 2015-02-24 MED ORDER — AMITRIPTYLINE HCL 50 MG PO TABS
25.0000 mg | ORAL_TABLET | Freq: Every day | ORAL | Status: DC
  Administered 2015-02-24 – 2015-02-26 (×4): 25 mg via ORAL
  Filled 2015-02-24 (×4): qty 1

## 2015-02-24 MED ORDER — NA FERRIC GLUC CPLX IN SUCROSE 12.5 MG/ML IV SOLN
250.0000 mg | INTRAVENOUS | Status: AC
  Administered 2015-02-24 – 2015-02-26 (×2): 250 mg via INTRAVENOUS
  Filled 2015-02-24 (×4): qty 20

## 2015-02-24 MED ORDER — PANTOPRAZOLE SODIUM 40 MG PO TBEC
40.0000 mg | DELAYED_RELEASE_TABLET | Freq: Every day | ORAL | Status: DC
  Administered 2015-02-24 – 2015-02-26 (×3): 40 mg via ORAL
  Filled 2015-02-24 (×3): qty 1

## 2015-02-24 MED ORDER — LANTHANUM CARBONATE 500 MG PO CHEW
2000.0000 mg | CHEWABLE_TABLET | Freq: Three times a day (TID) | ORAL | Status: DC
  Administered 2015-02-24 – 2015-02-26 (×5): 2000 mg via ORAL
  Filled 2015-02-24 (×7): qty 4

## 2015-02-24 MED ORDER — ENOXAPARIN SODIUM 40 MG/0.4ML ~~LOC~~ SOLN: 40.0000 mg | SUBCUTANEOUS | Status: DC

## 2015-02-24 MED ORDER — HEPARIN SODIUM (PORCINE) 5000 UNIT/ML IJ SOLN
5000.0000 [IU] | Freq: Three times a day (TID) | INTRAMUSCULAR | Status: DC
  Administered 2015-02-24 – 2015-02-26 (×8): 5000 [IU] via SUBCUTANEOUS
  Filled 2015-02-24 (×7): qty 1

## 2015-02-24 MED ORDER — CALCITRIOL 0.25 MCG PO CAPS
1.5000 ug | ORAL_CAPSULE | ORAL | Status: DC
  Administered 2015-02-24: 1.5 ug via ORAL
  Filled 2015-02-24 (×2): qty 6
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
## 2015-02-24 NOTE — Procedures (Signed)
I have seen and examined this patient and agree with the plan of care . Seen on dialysis  Access Right Catheter   [name] W 02/24/2015, 1:47 PM

## 2015-02-24 NOTE — Progress Notes (Signed)
BS of 69, pt encouraged to eat/drink. Pt. States "I'm not diabetic" I educated patient about blood sugar, regardless of patient not being diabetic. Pt. States " I ordered a new dinner tray" Pt. Encouraged to drink his juice. Will continue to monitor.

## 2015-02-24 NOTE — ED Notes (Signed)
Attempted report x1. Marily Memos, RN states she will call back in about 5 minutes

## 2015-02-24 NOTE — ED Notes (Signed)
Pt given peanut butter, crackers and apple juice. After eating peanut butter and crackers and drinking a small amount of juice, CBG is 77. Pt is still working on apple juice
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
## 2015-02-24 NOTE — Progress Notes (Signed)
On Call night [place] met with pt who desired to speak with sister in early AM.  If still required, please submit additional consult or request.

## 2015-02-24 NOTE — Telephone Encounter (Signed)
Spoke with Lenny Pastel, PA, who is seeing patient in the hospital currently.  Pt is scheduled for an ov with TP on 03/10/15, and for a sleep consult with CY in March 2017.  Pt states that he does well on a cpap while in the hospital but struggles to sleep at night when is home.  Onalee Hua is requesting to speak directly to TP about this patient and what our options are for his cpap therapy.  Pt has not been seen in office since 03/09/2012 by MW.  Onalee Hua is reachable at [phone].  Onalee Hua would prefer to be reached before 5:00 if possible.   TP please advise.  Thanks!

## 2015-02-24 NOTE — Care Management Obs Status (Signed)
MEDICARE OBSERVATION STATUS NOTIFICATION   Patient Details  Name: Gary Rivera MRN: [id number] Date of Birth: 30-Nov-1960   Medicare Observation Status Notification Given:  Yes    Leone Haven, RN 02/24/2015, 5:18 PM

## 2015-02-24 NOTE — Progress Notes (Signed)
PT cancel note:  Attempted to see pt, however pt in HD, therefore will check back tomorrow.    Thanks,             Harriet Butte, PT, MPT

## 2015-02-24 NOTE — H&P (Signed)
History and Physical  Patient Name: Gary Rivera     ZOX:[id number]    DOB: Oct 30, 1960    DOA: 02/23/2015 Referring physician: Maris Berger, MD PCP: [name], MD      Chief Complaint: Confusion  HPI: Gary Rivera is a 54 y.o. male with a past medical history significant for NICM and chronic systolic and diastolic CHF last EF 45% in 2016, OSA on nocturnal oxygen, OHS, pulmonary hypertension, and ESRD on HD TThS who presents with confusion and myoclonus.  The patient was admitted for pulmonary edema 8 days ago due to noncompliance with home IHD.  During that hospitalization, a left brachiocephalic AV fistula was created, a temporary tunneled catheter was placed while that matured, and the patient started HD at the dialysis center.  He is not accompanied tonight, and is judged to be a poor historian at this time. He reports that he missed his "Sunday" dialysis, and so was getting a makeup session today.  EMS was called but he does not know why. Per EDP, the patient was confused during dialysis and dialysis staff called 911. EMS found the patient was blood sugar 50 mg/dL which responded to oral glucose.  In the ED, the patient had creatinine 14, BUN 60, and required nasal O2.  A non-contrasted head CT was normal, and TRH were called for admission for altered mental status.  The patient reports taking two oxycodone today, but no morphine and no other illicit drugs.  He endorses depressed mood.  He notes myoclonic jerking, because he is "cold", nausea without emesis.    Review of Systems:  All other systems negative except as just noted or noted in the history of present illness.  Allergies  Allergen Reactions  . Renvela [Sevelamer] Nausea And Vomiting    Prior to Admission medications   Medication Sig Start Date End Date Taking? Authorizing Provider  amitriptyline (ELAVIL) 25 MG tablet Take 25 mg by mouth at bedtime.   Yes Historical Provider, MD  clonazePAM (KLONOPIN) 0.5 MG  tablet Take 1 mg by mouth 2 (two) times daily as needed for anxiety.    Yes Historical Provider, MD  lanthanum (FOSRENOL) 1000 MG chewable tablet Chew 2 tablets (2,000 mg total) by mouth 3 (three) times daily with meals. 02/05/15  Yes Drema Dallas, MD  metoCLOPramide (REGLAN) 10 MG tablet Take 10 mg by mouth daily. 05/03/14  Yes Historical Provider, MD  multivitamin (RENA-VIT) TABS tablet Take 1 tablet by mouth at bedtime. 05/21/14  Yes Jessica Upchurch Vann, DO  Omega-3 1000 MG CAPS Take 1 g by mouth 2 (two) times daily.   Yes Historical Provider, MD  omeprazole (PRILOSEC) 20 MG capsule Take 20 mg by mouth daily. 02/10/15  Yes Historical Provider, MD  Oxycodone HCl 10 MG TABS Take 1 tablet (10 mg total) by mouth 3 (three) times daily as needed. Patient taking differently: Take 10 mg by mouth 3 (three) times daily as needed (pain).  05/21/14  Yes Charlette Caffey Vann, DO  pantoprazole (PROTONIX) 40 MG tablet Take 1 tablet (40 mg total) by mouth daily. 02/05/15  Yes Drema Dallas, MD  vitamin E 400 UNIT capsule Take 400 Units by mouth daily.   Yes Historical Provider, MD  albuterol (PROVENTIL HFA;VENTOLIN HFA) 108 (90 BASE) MCG/ACT inhaler Inhale 1 puff into the lungs every 6 (six) hours as needed for wheezing or shortness of breath.    Historical Provider, MD    Past Medical History  Diagnosis Date  . ESRD on  hemodialysis (HCC)     Started dialysis with PD in 2013 and switched to HD in spring 2014 because of fungal peritonitis.  Takes dialysis at home 5 days a week approx 3h 15 min each session.     . CHF (congestive heart failure) (HCC)   . Peritoneal dialysis catheter in place [facility])   . Anemia     patient reporats that last hgb 7.9 a week ago  . Anxiety state, unspecified   . Hypertension     Past Surgical History  Procedure Laterality Date  . Appendectomy    . Peritoneal catheter insertion    . Tee without cardioversion N/A 07/11/2013    Procedure: TRANSESOPHAGEAL ECHOCARDIOGRAM (TEE);   Surgeon: Pricilla Riffle, MD;  Location: [facility] ENDOSCOPY;  Service: Cardiovascular;  Laterality: N/A;  . Cardioversion N/A 07/11/2013    Procedure: CARDIOVERSION;  Surgeon: Pricilla Riffle, MD;  Location: [facility] ENDOSCOPY;  Service: Cardiovascular;  Laterality: N/A;  . Knee arthroscopy Right 05/18/2014    Procedure: ARTHROSCOPIC WASHOUT OF RIGHT KNEE;  Surgeon: Sheral Apley, MD;  Location[facility] OR;  Service: Orthopedics;  Laterality: Right;  . Av fistula placement Left 02/18/2015    Procedure: Left Brachial Cephalic ARTERIOVENOUS FISTULA CREATION;  Surgeon: Sherren Kerns, MD;  Location: [facility] OR;  Service: Vascular;  Laterality: Left;  . Insertion of dialysis catheter Right 02/18/2015    Procedure: INSERTION OF DIALYSIS CATHETER Right Internal Jugular;  Surgeon: Sherren Kerns, MD;  Location: [facility] OR;  Service: Vascular;  Laterality: Right;    Family history: family history includes Asthma in his sister; Diabetes in his mother; Hypertension in his mother and sister.  Social History: Patient lives at home by himself.  Non-smoker.  Independent with all ADLs.  Able to drive.         Physical Exam: BP 143/77 mmHg  Pulse 93  Temp(Src) 98.1 F (36.7 C) (Oral)  Resp 26  SpO2 96% General appearance: Well-developed, adult male, sleepy but in no acute distress.   Eyes: Anicteric, lids and lashes normal.     ENT: No nasal deformity, discharge, or epistaxis.  OP moist without lesions.   Skin: Warm and dry.  Keloids.  Cardiac: Tachycardic, regular, nl S1-S2, no murmurs appreciated.  Respiratory: Snoring respirations.  No increased work of breathing, no wheezes or rales. Abdomen: Abdomen soft without rigidity withoutTTP.  MSK: No deformities or effusions. Neuro: Sleepy, drifts off repeatedly.  Only makes 2/3 in 3-item recall.  Oriented to person, place, and time.  Remembers some events from today, but requires prompting.  Cranial nerves grossly normal.  Speech is fluent.    Psych: Behavior appropriate.   Affect normal, but sleepy.  No evidence of aural or visual hallucinations or delusions.       Labs on Admission:  The metabolic panel shows normal sodium, potassium, bicarbonate. The serum glucose is 75 mg/dL. The BUN is 16 and the creatinine is 14. The ammonia level is normal. The complete blood count shows chronic normocytic anemia, chronic mild thrombocytopenia.   Radiological Exams on Admission: Personally reviewed: Dg Chest 2 View  02/24/2015  CLINICAL DATA:  Acute onset of shortness of breath. Encephalopathy. Initial encounter. EXAM: CHEST  2 VIEW COMPARISON:  Chest radiograph performed 02/18/2015 FINDINGS: The lungs are hypoexpanded. Vascular congestion and vascular crowding are noted, with increased interstitial markings, raising question for minimal interstitial edema. No definite pleural effusion or pneumothorax is seen. There is mild elevation of the right hemidiaphragm. The cardiomediastinal silhouette is borderline  normal in size. A right-sided dual-lumen catheter is noted ending at the proximal right atrium. No acute osseous abnormalities are seen. IMPRESSION: Lungs hypoexpanded. Vascular congestion noted, with increased interstitial markings, raising question for minimal interstitial edema. Mild elevation of the right hemidiaphragm. Electronically Signed   By: Roanna Raider M.D.   On: 02/24/2015 00:42   Ct Head Wo Contrast  02/23/2015  CLINICAL DATA:  Altered mental status EXAM: CT HEAD WITHOUT CONTRAST TECHNIQUE: Contiguous axial images were obtained from the base of the skull through the vertex without intravenous contrast. COMPARISON:  None. FINDINGS: Bony calvarium is intact. No findings to suggest acute hemorrhage, acute infarction or space-occupying mass lesion are noted. Mild motion artifact is noted. IMPRESSION: No acute abnormality noted. Electronically Signed   By: Alcide Clever M.D.   On: 02/23/2015 20:47    EKG: Independently reviewed. Lateral TW abnormality,  unchanged.     Assessment/Plan 1. Encephalopathy:  This is new.  Suspected uremic encephalopathy given missed HD, BUN level, nausea, and myoclonus.  No medication changes or additions lately.  Denies illicit drug use or ingestion.  No evidence of infection.   -HD per nephrology, appreciate cares -Doubt infection, but will order blood culture    2. ESRD:  Consult to nephrology, appreciate recommendations Continue lanthanum/phos binder  3. Anemia of renal failure:  Stable  4. OHS and OSA:  CPAP at night  5. Hypoglycemia:  Accuchecks TID AC and QHS  6. Depression, anxiety and pain:  Stable.  Continue amitriptyline, clonazepam.   Hold oxycodone for now.      DVT PPx: Heparin Diet: Renal Consultants: Nephrology Code Status: Full Family Communication: None  Medical decision making: What exists of the patient's previous chart was reviewed in depth and the case was discussed with Dr. Margreta Journey. Patient seen 1:12 AM on 02/24/2015.  Disposition Plan:  Admit for consultation with nephrology regarding HD.  Anticipate that patient may be stable for discharge after dialysis.      Alberteen Sam [facility] Pager [phone]

## 2015-02-25 DIAGNOSIS — E662 Morbid (severe) obesity with alveolar hypoventilation: Secondary | ICD-10-CM

## 2015-02-25 DIAGNOSIS — N186 End stage renal disease: Secondary | ICD-10-CM

## 2015-02-25 DIAGNOSIS — G9340 Encephalopathy, unspecified: Secondary | ICD-10-CM

## 2015-02-25 LAB — GLUCOSE, CAPILLARY
GLUCOSE-CAPILLARY: 98 mg/dL (ref 65–99)
GLUCOSE-CAPILLARY: 100 mg/dL — AB (ref 65–99)
Glucose-Capillary: 115 mg/dL — ABNORMAL HIGH (ref 65–99)

## 2015-02-25 NOTE — Clinical Documentation Improvement (Signed)
Internal Medicine Nephrology/Renal  Abnormal Lab/Test Results:   Component      Platelets  Latest Ref Rng      150 - 400 K/uL  02/23/2015     7:49 PM 137 (L)  02/24/2015     2:09 PM 138 (L)    Possible Clinical Conditions associated with above lab values:  Thrombocytopenia  Other Condition  Cannot Clinically Determine   Please exercise your independent, professional judgment when responding. A specific answer is not anticipated or expected.  Thank you, Doy Mince, RN [phone] Clinical Documentation Specialist

## 2015-02-25 NOTE — Progress Notes (Signed)
[facility] PROGRESS NOTE  Gary Rivera RCV:[id number] DOB: January 18, 1961 DOA: 02/23/2015 PCP: [name], MD  Assessment/Plan: 1. Acute encephalopathy. -Patient was sent to the emergency department on day of admission from dialysis center for mental status changes. -Suspect this is multifactorial with narcotics, uremia, hypoglycemia or probable contributors. -By 02/25/2015 he show significant clinical improvements and appear to be at his baseline.  2.  End-stage renal disease. -On 02/18/2015 he underwent placement with right internal jugular vein dialysis catheter -Nephrology consulted -Patient undergoing hemodialysis during this hospitalization.  3.  Hypoxemic respiratory failure -Evidenced by increased oxygen requirement of 3 L nasal cannula and respiratory rate of 24 -Recent hospitalization for volume overloaded state. -Chest x-ray performed on 02/23/2015 showing vascular congestion with increased interstitial markings -I suspect he may also be having obstructive sleep apnea/obesity hypoventilation syndrome. He reports only using [place] oxygen at nighttime and has not undergone sleep study. Spoke with nephrology; he has been set up with outpatient sleep study on 03/10/2015.  4.  Hypoglycemia. -Patient was found to be hypoglycemic at his outpatient dialysis center yesterday. -Blood sugars have been stable throughout the day between 81 and 100. He is currently not taking hypoglycemic agents.   Code Status: Full Code Family Communication: Family not present Disposition Plan: Anticipate discharge in the next 24 hours   Consultants:  Nephrology  Procedures:  HD  HPI/Subjective: Gary Rivera is a 54 year old male with a past medical history of end-stage renal disease on Tuesday, Thursday, Saturday hemodialysis schedule, history of nonischemic myopathy was admitted to the medicine service on 02/24/2015 resenting with mental status changes. He was recently hospitalized from  02/16/2015 through 02/19/2015 where he was treated for volume overload and malfunctioning fistula. Prior to this he had been on peritoneal dialysis and then switched to IHD. He was sent to the emergency department on day of admission from the dialysis center for mental status changes and hypoglycemia. Mental status changes felt to be multifactorial with narcotics, uremia and hypoglycemia possible contributors. By 02/25/2015 he show significant improvement. Nephrology was consulted as he underwent hemodialysis during this hospitalization.  Objective: Filed Vitals:   02/25/15 0543 02/25/15 1300  BP: 109/44 99/56  Pulse: 100 93  Temp: 99.1 F (37.3 C) 98.2 F (36.8 C)  Resp: 18 20    Intake/Output Summary (Last 24 hours) at 02/25/15 1509 Last data filed at 02/25/15 1025  Gross per 24 hour  Intake    300 ml  Output   1703 ml  Net  -1403 ml   Filed Weights   02/24/15 0135 02/24/15 1348 02/24/15 1819  Weight: 155 kg (341 lb 11.4 oz) 140.4 kg (309 lb 8.4 oz) 138.6 kg (305 lb 8.9 oz)    Exam:   General:  Patient is in no acute distress he is awake and alert, reports shortness of breath.  Cardiovascular: Regular rate rhythm normal S1-S2 no murmurs gallops  Respiratory: Patient having a few bibasilar crackles, diminished breath sounds bilaterally.  Abdomen: Obese, soft, nontender nondistended  Musculoskeletal: No edema   Data Reviewed: Basic Metabolic Panel:  Recent Labs Lab 02/19/15 0254 02/19/15 1406 02/23/15 1949 02/24/15 1409  NA 139 138 138 137  K 5.1 5.7* 4.3 5.3*  CL 100* 99* 99* 97*  CO2 23 23 25 24   GLUCOSE 96 121* 88 103*  BUN 89* 99* 50* 67*  CREATININE 19.22* 20.83* 14.19* 16.52*  CALCIUM 6.5* 6.1* 8.9 7.9*   Liver Function Tests:  Recent Labs Lab 02/23/15 1949 02/24/15 1409  AST 19 17  ALT 17 15*  ALKPHOS 65 61  BILITOT 0.6 0.6  PROT 7.5 6.7  ALBUMIN 3.5 3.2*   No results for input(s): LIPASE, AMYLASE in the last 168 hours.  Recent Labs Lab  02/23/15 2147  AMMONIA 20   CBC:  Recent Labs Lab 02/19/15 1406 02/23/15 1949 02/24/15 1409  WBC 6.8 5.6 6.0  HGB 9.7* 10.0* 10.1*  HCT 32.7* 34.4* 32.9*  MCV 95.9 95.3 94.3  PLT 120* 137* 138*   Cardiac Enzymes: No results for input(s): CKTOTAL, CKMB, CKMBINDEX, TROPONINI in the last 168 hours. BNP (last 3 results)  Recent Labs  06/25/14 1317 02/02/15 0035  BNP 1060.8* 485.4*    ProBNP (last 3 results) No results for input(s): PROBNP in the last 8760 hours.  CBG:  Recent Labs Lab 02/24/15 1122 02/24/15 1852 02/24/15 2057 02/25/15 0805 02/25/15 1204  GLUCAP 127* 69 81 98 100*    Recent Results (from the past 240 hour(s))  Culture, blood (routine x 2)     Status: None (Preliminary result)   Collection Time: 02/24/15 10:45 AM  Result Value Ref Range Status   Specimen Description BLOOD RIGHT HAND  Final   Special Requests BOTTLES DRAWN AEROBIC AND ANAEROBIC 5CC  Final   Culture NO GROWTH 1 DAY  Final   Report Status PENDING  Incomplete  Culture, blood (routine x 2)     Status: None (Preliminary result)   Collection Time: 02/24/15  4:08 PM  Result Value Ref Range Status   Specimen Description BLOOD HEMODIALYSIS CATHETER  Final   Special Requests BOTTLES DRAWN AEROBIC AND ANAEROBIC 10CC  Final   Culture NO GROWTH < 24 HOURS  Final   Report Status PENDING  Incomplete     Studies: Dg Chest 2 View  02/24/2015  CLINICAL DATA:  Acute onset of shortness of breath. Encephalopathy. Initial encounter. EXAM: CHEST  2 VIEW COMPARISON:  Chest radiograph performed 02/18/2015 FINDINGS: The lungs are hypoexpanded. Vascular congestion and vascular crowding are noted, with increased interstitial markings, raising question for minimal interstitial edema. No definite pleural effusion or pneumothorax is seen. There is mild elevation of the right hemidiaphragm. The cardiomediastinal silhouette is borderline normal in size. A right-sided dual-lumen catheter is noted ending at the  proximal right atrium. No acute osseous abnormalities are seen. IMPRESSION: Lungs hypoexpanded. Vascular congestion noted, with increased interstitial markings, raising question for minimal interstitial edema. Mild elevation of the right hemidiaphragm. Electronically Signed   By: Roanna Raider M.D.   On: 02/24/2015 00:42   Ct Head Wo Contrast  02/23/2015  CLINICAL DATA:  Altered mental status EXAM: CT HEAD WITHOUT CONTRAST TECHNIQUE: Contiguous axial images were obtained from the base of the skull through the vertex without intravenous contrast. COMPARISON:  None. FINDINGS: Bony calvarium is intact. No findings to suggest acute hemorrhage, acute infarction or space-occupying mass lesion are noted. Mild motion artifact is noted. IMPRESSION: No acute abnormality noted. Electronically Signed   By: Alcide Clever M.D.   On: 02/23/2015 20:47    Scheduled Meds: . amitriptyline  25 mg Oral QHS  . calcitRIOL  1.5 mcg Oral Q T,Th,Sa-HD  . [START ON 03/03/2015] darbepoetin (ARANESP) injection - DIALYSIS  100 mcg Intravenous Q Tue-HD  . ferric gluconate (FERRLECIT/NULECIT) IV  250 mg Intravenous Q T,Th,Sa-HD  . [START ON 03/05/2015] ferric gluconate (FERRLECIT/NULECIT) IV  50 mg Intravenous Q Thu-HD  . heparin subcutaneous  5,000 Units Subcutaneous 3 times per day  . lanthanum  2,000 mg Oral TID WC  .  pantoprazole  40 mg Oral Daily   Continuous Infusions:   Active Problems:   Chronic systolic congestive heart failure, NYHA class 1 (HCC)   HTN (hypertension)   Anemia   Pulmonary HTN- 50 mmHg 2011 ECHO   Obesity (BMI 30-39.9)   Thrombocytopenia (HCC)   Obesity hypoventilation syndrome (HCC)   Atrial flutter with RVR   End stage renal disease-  on HD   NICM- recent EF "Nl" per pt- followed at [facility]   Encephalopathy    Time spent: 30 min    Gary Rivera  [facility] Pager [phone]. If 7PM-7AM, please contact night-coverage at [email or web address], password [facility] 02/25/2015, 3:09 PM  LOS: 2  days

## 2015-02-25 NOTE — Progress Notes (Signed)
   02/25/15 2358  BiPAP/CPAP/SIPAP  BiPAP/CPAP/SIPAP Pt Type Adult  Mask Type Full face mask  Mask Size Large  Set Rate 0 breaths/min  Respiratory Rate 20 breaths/min  IPAP 8 cmH20  EPAP 8 cmH2O  Flow Rate 3 lpm  BiPAP/CPAP/SIPAP CPAP  Patient Home Equipment No  Auto Titrate No

## 2015-02-25 NOTE — Evaluation (Signed)
Physical Therapy Evaluation Patient Details Name: Gary Rivera MRN: [id number] DOB: 22-Nov-1960 Today's Date: 02/25/2015   History of Present Illness  Gary Rivera is a 54 y.o. male with a past medical history significant for NICM and chronic systolic and diastolic CHF last EF 45% in 2016, OSA on nocturnal oxygen, OHS, pulmonary hypertension, and ESRD on HD TThS who presents with confusion and myoclonus.  Clinical Impression  Pt presents with above.  Note decreased mobility with increased work of breathing during PT eval.   He was overall steady without AD, however feel that AD may assist with energy conservation.  Pt verbalized understanding and states he has RW at home.  See sats below as he will need O2 at home during mobility.  Pt will benefit from skilled acute services to address deficits.  PT recommends HHPT for follow up to increase functional independence.    SATURATION QUALIFICATIONS: (This note is used to comply with regulatory documentation for home oxygen)  Patient Saturations on Room Air at Rest = 92%  Patient Saturations on Room Air while Ambulating = 81%  Patient Saturations on 2 Liters of oxygen while Ambulating = 94%  Please briefly explain why patient needs home oxygen: Pt will need O2 during all mobility at home to prevent de-saturation.      Follow Up Recommendations Home health PT    Equipment Recommendations  None recommended by PT    Recommendations for Other Services       Precautions / Restrictions Precautions Precautions: Fall Precaution Comments: monitor O2 Restrictions Weight Bearing Restrictions: No      Mobility  Bed Mobility Overal bed mobility: Modified Independent                Transfers Overall transfer level: Modified independent Equipment used: None                Ambulation/Gait Ambulation/Gait assistance: Supervision;Min guard Ambulation Distance (Feet): 20 Feet (then another 70') Assistive device: None Gait  Pattern/deviations: Step-through pattern;Decreased stride length;Wide base of support     General Gait Details: Pt ambulated in halllway short distance mentioned above without O2 donned, note that SaO2 dropped immediately to 81%, therefore assisted back to room and donned 4LO2.  Pt able to increase sats to 96-97%, therefore assessed gait again with 2LO2 donned (this is how much pt normally wears at night only).  He was able to maintain sats at 94% on 2LO2 during gait, therefore feel he will need at home at all times during mobility.    Stairs            Wheelchair Mobility    Modified Rankin (Stroke Patients Only)       Balance Overall balance assessment: Needs assistance Sitting-balance support: Feet supported Sitting balance-Leahy Scale: Normal     Standing balance support: During functional activity;No upper extremity supported Standing balance-Leahy Scale: Fair                               Pertinent Vitals/Pain Pain Assessment: No/denies pain    Home Living Family/patient expects to be discharged to:: Private residence Living Arrangements: Alone;Other relatives Available Help at Discharge: Family;Personal care attendant (sister checks in on him, HH aide 3 hrs/day, 7days/wk) Type of Home: House Home Access: Ramped entrance     Home Layout: One level Home Equipment: Walker - 2 wheels Additional Comments: uses walker when legs "feel bad"     Prior Function  Level of Independence: Needs assistance   Gait / Transfers Assistance Needed: RW intermittently  ADL's / Homemaking Assistance Needed: HH aide assists with cooking and cleaning, getting in and out of shower        Hand Dominance   Dominant Hand: Right    Extremity/Trunk Assessment               Lower Extremity Assessment: Generalized weakness      Cervical / Trunk Assessment: Normal  Communication   Communication: No difficulties  Cognition Arousal/Alertness:  Awake/alert Behavior During Therapy: WFL for tasks assessed/performed Overall Cognitive Status: Within Functional Limits for tasks assessed                      General Comments      Exercises        Assessment/Plan    PT Assessment Patient needs continued PT services  PT Diagnosis Difficulty walking;Generalized weakness   PT Problem List Decreased strength;Decreased activity tolerance;Decreased balance;Decreased mobility;Cardiopulmonary status limiting activity;Obesity  PT Treatment Interventions DME instruction;Gait training;Functional mobility training;Therapeutic activities;Therapeutic exercise;Balance training;Patient/family education   PT Goals (Current goals can be found in the Care Plan section) Acute Rehab PT Goals Patient Stated Goal: to get my breathing better PT Goal Formulation: With patient Time For Goal Achievement: 03/04/15 Potential to Achieve Goals: Good    Frequency Min 3X/week   Barriers to discharge        Co-evaluation               End of Session   Activity Tolerance: Patient tolerated treatment well Patient left: in bed (sitting at EOB) Nurse Communication: Mobility status         Time: 4540-9811 PT Time Calculation (min) (ACUTE ONLY): 20 min   Charges:   PT Evaluation $Initial PT Evaluation Tier I: 1 Procedure     PT G CodesVista Deck 02/25/2015, 9:10 AM

## 2015-02-25 NOTE — Progress Notes (Signed)
Pt. Refuses blood sugar times two. Will encourage to monitor and encourage patient to eat to keep blood sugar up.

## 2015-02-25 NOTE — Progress Notes (Signed)
Subjective:  Awoken from snoring  Sleep no cos ,tolerated HD yest.   Objective Vital signs in last 24 hours: Filed Vitals:   02/24/15 1819 02/24/15 2103 02/24/15 2241 02/25/15 0543  BP: 106/62 109/71  109/44  Pulse: 92 98 92 100  Temp:  99.7 F (37.6 C)  99.1 F (37.3 C)  TempSrc:  Oral  Oral  Resp: 21 20 16 18   Height:      Weight: 138.6 kg (305 lb 8.9 oz)     SpO2: 100% 99% 94% 90%   Weight change: -14.6 kg (-32 lb 3 oz) Physical Exam: General: Alert Obese BM, WD, WN, NAD OX4 Heart: RRR, no mur, rub or gallop Lungs: CTA, nonlabored breathing Abdomen: Obese, soft nt, nd Extremities: trace bipedal edema Dialysis Access: R IJ Perm cath / LUAAVF  Dialysis Orders: Center: [facility] on TTS . OP HD= EDW 138.5 HD Bath 2k,3.5ca Time 4.5hrs Heparin 4000. Access 02-18-15 new LUAAVF, R fem perm cath  Calcitriol 1.5 mcg po /HD Mircera50mg  q 2weeks last Units IV/HD Venofer 100mg  q hd last dose 03/03/15  Problem/Plan: 1. Encephalopathy= Multiple etiologies= Resolved now/ Narcotic effect/?uremic/ infect (recent Perm cath and avf placed last week)bc no growth so far/ reported low BS op kid cent.  (not DM) 2. ESRD - HD TTS HD on scheule /sp new LUA AVF and R IJ perm cath last week 3. Anemia - HGB 10.1 yest. esa (mircera next on 03/03/15)and FE load 4. Secondary hyperparathyroidism - PO vit d on hd and binder 5. HTN/volume -CXR showing Interstitial edemaon admit / And by wt 17 kg up(155 kg) need stand wt  re weighed pre hd stand= 140.4kg wth post HD  wt 138.6kg o bp lowish 109/72 / no bp meds/ attempt uf next hd as bp allows. 6. OSA- CPAP at night IN Hosp Only , DOES NOT HAVE CPAP at home , needs SLEEP STUDY done asap post dc and has Apt 03/10/15 with Pulm NP  Tammy Parrett I discussed the need for CPAP fu with her yesterday per phone/she tells me she will fu sleep study as op. 7.Hyoglycemia - per admit  8.Depression/ Anixety - per admit team/0n Amitriptyline and clonazepam     Lenny Pastel, PA-C [facility] [phone] 02/25/2015,10:35 AM  LOS: 2 days   Labs: Basic Metabolic Panel:  Recent Labs Lab 02/19/15 1406 02/23/15 1949 02/24/15 1409  NA 138 138 137  K 5.7* 4.3 5.3*  CL 99* 99* 97*  CO2 23 25 24   GLUCOSE 121* 88 103*  BUN 99* 50* 67*  CREATININE 20.83* 14.19* 16.52*  CALCIUM 6.1* 8.9 7.9*   Liver Function Tests:  Recent Labs Lab 02/23/15 1949 02/24/15 1409  AST 19 17  ALT 17 15*  ALKPHOS 65 61  BILITOT 0.6 0.6  PROT 7.5 6.7  ALBUMIN 3.5 3.2*   No results for input(s): LIPASE, AMYLASE in the last 168 hours.  Recent Labs Lab 02/23/15 2147  AMMONIA 20   CBC:  Recent Labs Lab 02/19/15 1406 02/23/15 1949 02/24/15 1409  WBC 6.8 5.6 6.0  HGB 9.7* 10.0* 10.1*  HCT 32.7* 34.4* 32.9*  MCV 95.9 95.3 94.3  PLT 120* 137* 138*   Cardiac Enzymes: No results for input(s): CKTOTAL, CKMB, CKMBINDEX, TROPONINI in the last 168 hours. CBG:  Recent Labs Lab 02/24/15 0811 02/24/15 1122 02/24/15 1852 02/24/15 2057 02/25/15 0805  GLUCAP 93 127* 69 81 98    Studies/Results: Dg Chest 2 View  02/24/2015  CLINICAL DATA:  Acute  onset of shortness of breath. Encephalopathy. Initial encounter. EXAM: CHEST  2 VIEW COMPARISON:  Chest radiograph performed 02/18/2015 FINDINGS: The lungs are hypoexpanded. Vascular congestion and vascular crowding are noted, with increased interstitial markings, raising question for minimal interstitial edema. No definite pleural effusion or pneumothorax is seen. There is mild elevation of the right hemidiaphragm. The cardiomediastinal silhouette is borderline normal in size. A right-sided dual-lumen catheter is noted ending at the proximal right atrium. No acute osseous abnormalities are seen. IMPRESSION: Lungs hypoexpanded. Vascular congestion noted, with increased interstitial markings, raising question for minimal interstitial edema. Mild elevation of the right hemidiaphragm.  Electronically Signed   By: Roanna Raider M.D.   On: 02/24/2015 00:42   Ct Head Wo Contrast  02/23/2015  CLINICAL DATA:  Altered mental status EXAM: CT HEAD WITHOUT CONTRAST TECHNIQUE: Contiguous axial images were obtained from the base of the skull through the vertex without intravenous contrast. COMPARISON:  None. FINDINGS: Bony calvarium is intact. No findings to suggest acute hemorrhage, acute infarction or space-occupying mass lesion are noted. Mild motion artifact is noted. IMPRESSION: No acute abnormality noted. Electronically Signed   By: Alcide Clever M.D.   On: 02/23/2015 20:47   Medications:   . amitriptyline  25 mg Oral QHS  . calcitRIOL  1.5 mcg Oral Q T,Th,Sa-HD  . [START ON 03/03/2015] darbepoetin (ARANESP) injection - DIALYSIS  100 mcg Intravenous Q Tue-HD  . ferric gluconate (FERRLECIT/NULECIT) IV  250 mg Intravenous Q T,Th,Sa-HD  . [START ON 03/05/2015] ferric gluconate (FERRLECIT/NULECIT) IV  50 mg Intravenous Q Thu-HD  . heparin subcutaneous  5,000 Units Subcutaneous 3 times per day  . lanthanum  2,000 mg Oral TID WC  . pantoprazole  40 mg Oral Daily

## 2015-02-25 NOTE — Telephone Encounter (Signed)
Per Marchelle Folks, this was handled by TP yesterday afternoon.  Nothing further needed.

## 2015-02-25 NOTE — Consult Note (Signed)
   [facility] CM Inpatient Consult   02/25/2015  Gary Rivera [birth year] [id number] Patient was evaluated for [facility] Care Management services. Thank you for this consult.   This patient is Not eligible for [facility] Care Management Services.   Reason:  Not a beneficiary currently attributed to one of the [facility] ACO Registry populations.  Membership roster was used to verify non- eligible status. Charlesetta Shanks, RN BSN CCM [facility]  [phone] business mobile phone

## 2015-02-26 DIAGNOSIS — I429 Cardiomyopathy, unspecified: Secondary | ICD-10-CM

## 2015-02-26 DIAGNOSIS — I5022 Chronic systolic (congestive) heart failure: Secondary | ICD-10-CM

## 2015-02-26 LAB — GLUCOSE, CAPILLARY
GLUCOSE-CAPILLARY: 112 mg/dL — AB (ref 65–99)
Glucose-Capillary: 90 mg/dL (ref 65–99)
Glucose-Capillary: 62 mg/dL — ABNORMAL LOW (ref 65–99)

## 2015-02-26 LAB — BASIC METABOLIC PANEL
ANION GAP: 12 (ref 5–15)
BUN: 53 mg/dL — AB (ref 6–20)
CHLORIDE: 101 mmol/L (ref 101–111)
CO2: 28 mmol/L (ref 22–32)
Calcium: 7.6 mg/dL — ABNORMAL LOW (ref 8.9–10.3)
Creatinine, Ser: 14.77 mg/dL — ABNORMAL HIGH (ref 0.61–1.24)
GFR calc Af Amer: 4 mL/min — ABNORMAL LOW (ref >60)
GFR, EST NON AFRICAN AMERICAN: 3 mL/min — AB (ref >60)
Glucose, Bld: 109 mg/dL — ABNORMAL HIGH (ref 65–99)
POTASSIUM: 4.2 mmol/L (ref 3.5–5.1)
Sodium: 141 mmol/L (ref 135–145)

## 2015-02-26 LAB — CBC
HEMATOCRIT: 32.4 % — AB (ref 39.0–52.0)
HEMOGLOBIN: 9.6 g/dL — AB (ref 13.0–17.0)
MCH: 28.7 pg (ref 26.0–34.0)
MCHC: 29.6 g/dL — ABNORMAL LOW (ref 30.0–36.0)
MCV: 96.7 fL (ref 78.0–100.0)
Platelets: 141 10*3/uL — ABNORMAL LOW (ref 150–400)
RBC: 3.35 MIL/uL — AB (ref 4.22–5.81)
RDW: 15.2 % (ref 11.5–15.5)
WBC: 5.8 10*3/uL (ref 4.0–10.5)

## 2015-02-26 MED ORDER — HEPARIN 1000 UNIT/ML FOR PERITONEAL DIALYSIS
5000.0000 [IU] | Freq: Once | INTRAMUSCULAR | Status: AC
  Administered 2015-02-26: 5000 [IU] via INTRAPERITONEAL

## 2015-02-26 MED ORDER — DOXERCALCIFEROL 4 MCG/2ML IV SOLN
6.0000 ug | INTRAVENOUS | Status: DC
  Administered 2015-02-26: 6 ug via INTRAVENOUS

## 2015-02-26 MED ORDER — DOXERCALCIFEROL 4 MCG/2ML IV SOLN
INTRAVENOUS | Status: AC
  Filled 2015-02-26: qty 2

## 2015-02-26 NOTE — Care Management Important Message (Signed)
Important Message  Patient Details  Name: Gary Rivera MRN: [id number] Date of Birth: Aug 08, 1960   Medicare Important Message Given:  Yes    Kyla Balzarine 02/26/2015, 1:59 PM

## 2015-02-26 NOTE — Progress Notes (Signed)
Subjective:  No cos , for hd today and dc after hd  Objective Vital signs in last 24 hours: Filed Vitals:   02/25/15 0543 02/25/15 1300 02/25/15 2206 02/26/15 0438  BP: 109/44 99/56 113/48 118/60  Pulse: 100 93 84 85  Temp: 99.1 F (37.3 C) 98.2 F (36.8 C)  98.3 F (36.8 C)  TempSrc: Oral Oral  Oral  Resp: Height:      Weight:      SpO2: 90% 90% 97% 97%   Weight change:  Physical Exam: General: Alert Obese BM, WD, WN, NAD OX4 Heart: RRR, no mur, rub or gallop Lungs: CTA, nonlabored breathing Abdomen: Obese, soft nt, nd Extremities: trace bipedal edema Dialysis Access: R IJ Perm cath / LUAAVF  Dialysis Orders: Center: EAST on TTS . OP HD= EDW 138.5 HD Bath 2k,3.5ca Time 4.5hrs Heparin 4000. Access 02-18-15 new LUAAVF, R fem perm cath  Calcitriol 1.5 mcg po /HD Mircera50mg  q 2weeks last Units IV/HD Venofer  q hd last dose 03/03/15  Problem/Plan: 1. Encephalopathy= Multiple etiologies= Resolved now/ Narcotic effect/?uremic/ [facility] (recent [facility] cath and avf placed last week)bc no growth 2 days / reported low BS op kid cent. (not DM) 2. ESRD - HD TTS HD on scheule /sp new LUA AVF and R IJ perm cath last week Hd on schedule today then sa at [facility] 3. Anemia - HGB 10.1  02/24/15. esa (mircera next on 03/03/15)and FE load 4. Secondary hyperparathyroidism - PO vit d on hd and binder/ He tells me today ("was on Calcitriol as home pt and changed to po Hectorol by Dr. Darrick Penna sec to NAUSEA with Calcitriol will use Iv Hectorol at op unit as dw [place] RN" )  5. HTN/volume -CXR showing Interstitial edemaon admit / And by wt 17 kg up(155 kg) re weighed pre hd stand= 140.4kg wth post HD wt 138.6kg bp lowish 99/56 today Asymptomatic / no bp meds/ attempt uf next hd as bp allows.      6. OSA- CPAP at night IN [address] Only , DOES NOT HAVE CPAP at home Noted  SLEEP STUDY arranged Jan 17done and has Apt 03/10/15 with Pulm NP Tammy Parrett   Arranged last dc      7.Hyoglycemia - per admit / resolved and not Diabetic     8.Depression/ Anixety - per admit team/0n Amitriptyline and clonazepam/ stable   Lenny Pastel, PA-C [facility] Beeper [phone] 02/26/2015,1:26 PM  LOS: 3 days   Labs: Basic Metabolic Panel:  Recent Labs Lab 02/19/15 1406 02/23/15 1949 02/24/15 1409  NA 138 138 137  K 5.7* 4.3 5.3*  CL 99* 99* 97*  CO2 GLUCOSE 121* 88 103*  BUN 99* 50* 67*  CREATININE 20.83* 14.19* 16.52*  CALCIUM 6.1* 8.9 7.9*   Liver Function Tests:  Recent Labs Lab 02/23/15 1949 02/24/15 1409  AST 19 17  ALT 17 15*  ALKPHOS 65 61  BILITOT 0.6 0.6  PROT 7.5 6.7  ALBUMIN 3.5 3.2*   No results for input(s): LIPASE, AMYLASE in the last 168 hours.  Recent Labs Lab 02/23/15 2147  AMMONIA 20   CBC:  Recent Labs Lab 02/19/15 1406 02/23/15 1949 02/24/15 1409  WBC 6.8 5.6 6.0  HGB 9.7* 10.0* 10.1*  HCT 32.7* 34.4* 32.9*  MCV 95.9 95.3 94.3  PLT 120* 137* 138*   Cardiac Enzymes: No results for input(s): CKTOTAL, CKMB, CKMBINDEX, TROPONINI in the last 168 hours.  CBG:  Recent Labs Lab 02/25/15 0805 02/25/15 1204 02/25/15 2202 02/26/15 0751 02/26/15 1152  GLUCAP 98 100* 115* 90 112*    Studies/Results: No results found. Medications:   . amitriptyline  25 mg Oral QHS  . calcitRIOL  1.5 mcg Oral Q T,Th,Sa-HD  . [START ON 03/03/2015] darbepoetin (ARANESP) injection - DIALYSIS  100 mcg Intravenous Q Tue-HD  . ferric gluconate (FERRLECIT/NULECIT) IV  250 mg Intravenous Q T,Th,Sa-HD  . [START ON 03/05/2015] ferric gluconate (FERRLECIT/NULECIT) IV  50 mg Intravenous Q Thu-HD  . heparin subcutaneous  5,000 Units Subcutaneous 3 times per day  . lanthanum  2,000 mg Oral TID WC  . pantoprazole  40 mg Oral Daily

## 2015-02-26 NOTE — Progress Notes (Signed)
Pt discharged home with belongings and oxygen from home health services. Summary reviewed with patient and all questions answered.

## 2015-02-26 NOTE — Care Management Note (Addendum)
Case Management Note  Patient Details  Name: Gary Rivera MRN: [id number] Date of Birth: 07-14-1960  Subjective/Objective:   Date: 02/26/15 Spoke with patient at the bedside.  Introduced self as Sports coach and explained role in discharge planning and how to be reached.  Verified patient lives in town, alone. Expressed potential need for bipap no other DME, Patient has been scheduled for a sleep study on 04/06/14 at 8 pm, this information has been given to the patient.  Verified patient anticipates to go home alone, at time of discharge and will have  part-time supervision by family at this time to best of their knowledge. Patient denied needing help with their medication.  Patient drives to MD appointments.  Verified patient has PCP Sami Roseanne Reno  Patient states he will need transport home today, NCM asked if he could take the bus , he will have an oxygen tank with him, he said he was not sure, he states he is set up with [facility] but they need 24 hr notification and his sister is sick so she would not be able to pick him up.  NCM informed CSW of this information.  Patient is for Hemo Diaylsis today about 12 noon or 1 pm which means he will be in HD until 5 pm. Per CSW [facility] states they have to take Diaylsis patient so to just call them when patient is done with Dialysis.    Patient chose [facility] for HHPT, referral given to [facility] with [facility] ,  Soc will begin 24-48 hrs post dc.     Plan: CM will continue to follow for discharge planning and [facility] resources.                  Action/Plan:   Expected Discharge Date:                  Expected Discharge Plan:  Home w Home Health Services  In-House Referral:  Clinical Social Work  Discharge planning Services  CM Consult  Post Acute Care Choice:  Durable Medical Equipment Choice offered to:  Patient  DME Arranged:  Oxygen DME Agency:     HH Arranged:    HH Agency:     Status of Service:  Completed, signed off  Medicare Important Message Given:     Date Medicare IM Given:    Medicare IM give by:    Date Additional Medicare IM Given:    Additional Medicare Important Message give by:     If discussed at Long Length of Stay Meetings, dates discussed:    Additional Comments:  Leone Haven, RN 02/26/2015, 11:33 AM

## 2015-02-26 NOTE — Progress Notes (Signed)
Tx completed.  Pt tolerated moderately with frequent cramping towards end of tx.  Uf goal reduced and d/c with 23 minutes left.  Report given to bedside RN, Lowella Bandy.  RIJ flushed and hep locked, cap changed.    Goal of 2450 and net fluid removal 1.85 L.

## 2015-02-26 NOTE — Discharge Summary (Signed)
Physician Discharge Summary  Gary Rivera ZOX:[id number] DOB: 1961-03-10 DOA: 02/23/2015  PCP: [name], MD  Admit date: 02/23/2015 Discharge date: 02/26/2015  Time spent: 35 minutes  Recommendations for Outpatient Follow-up:  1. Please follow up on respiratory status, he was discharged on continuous oxygen. 2. Prior to discharge, he was set up with Home Health Services for Home PT 3. He received HD on 02/26/2015 prior to discharge and will be resuming normal scheduled HD   Discharge Diagnoses:  Active Problems:   Chronic systolic congestive heart failure, NYHA class 1 (HCC)   HTN (hypertension)   Anemia   Pulmonary HTN- 50 mmHg 2011 ECHO   Obesity (BMI 30-39.9)   Thrombocytopenia (HCC)   Obesity hypoventilation syndrome (HCC)   Atrial flutter with RVR   End stage renal disease-  on HD   NICM- recent EF "Nl" per pt- followed at [facility]   Encephalopathy   Discharge Condition: Stable  Diet recommendation: Renal Diet  Filed Weights   02/24/15 0135 02/24/15 1348 02/24/15 1819  Weight: 155 kg (341 lb 11.4 oz) 140.4 kg (309 lb 8.4 oz) 138.6 kg (305 lb 8.9 oz)    History of present illness:  Gary Rivera is a 54 y.o. male with a past medical history significant for NICM and chronic systolic and diastolic CHF last EF 45% in 2016, OSA on nocturnal oxygen, OHS, pulmonary hypertension, and ESRD on HD TThS who presents with confusion and myoclonus.  The patient was admitted for pulmonary edema 8 days ago due to noncompliance with home IHD. During that hospitalization, a left brachiocephalic AV fistula was created, a temporary tunneled catheter was placed while that matured, and the patient started HD at the dialysis center.  He is not accompanied tonight, and is judged to be a poor historian at this time. He reports that he missed his "Sunday" dialysis, and so was getting a makeup session today. EMS was called but he does not know why. Per EDP, the patient was confused during  dialysis and dialysis staff called 911. EMS found the patient was blood sugar 50 mg/dL which responded to oral glucose.  In the ED, the patient had creatinine 14, BUN 60, and required nasal O2. A non-contrasted head CT was normal, and TRH were called for admission for altered mental status.  The patient reports taking two oxycodone today, but no morphine and no other illicit drugs. He endorses depressed mood. He notes myoclonic jerking, because he is "cold", nausea without emesis.  Hospital Course:  Gary Rivera is a 54 year old male with a past medical history of end-stage renal disease on Tuesday, Thursday, Saturday hemodialysis schedule, history of nonischemic myopathy was admitted to the medicine service on 02/24/2015 resenting with mental status changes. He was recently hospitalized from 02/16/2015 through 02/19/2015 where he was treated for volume overload and malfunctioning fistula. Prior to this he had been on peritoneal dialysis and then switched to IHD. He was sent to the emergency department on day of admission from the dialysis center for mental status changes and hypoglycemia. Mental status changes felt to be multifactorial with narcotics, uremia and hypoglycemia possible contributors. By 02/25/2015 he show significant improvement. Nephrology was consulted as he underwent hemodialysis during this hospitalization.   Acute encephalopathy. -Patient was sent to the emergency department on day of admission from dialysis center for mental status changes. -Suspect this is multifactorial with narcotics, uremia, hypoglycemia or probable contributors. -By 02/25/2015 he show significant clinical improvements and appear to be at his baseline.  2.  End-stage renal disease. -On 02/18/2015 he underwent placement with right internal jugular vein dialysis catheter -Nephrology consulted -Patient undergoing hemodialysis during this hospitalization  3. Hypoxemic respiratory failure -Evidenced by  increased oxygen requirement of 3 L nasal cannula and respiratory rate of 24 -Recent hospitalization for volume overloaded state. -Chest x-ray performed on 02/23/2015 showing vascular congestion with increased interstitial markings -I suspect he may also be having obstructive sleep apnea/obesity hypoventilation syndrome. He reports only using [place] oxygen at nighttime and has not undergone sleep study. Spoke with nephrology; he has been set up with outpatient sleep study on 04/07/2015. -He was set up with Home O2 continuous via [place] prior to discharge  4. Hypoglycemia. -Patient was found to be hypoglycemic at his outpatient dialysis center yesterday. -Blood sugars have been stable throughout the day between 81 and 100. He is currently not taking hypoglycemic agents.   Procedures:  HD  Consultations:  Nephrology  Discharge Exam: Filed Vitals:   02/25/15 2206 02/26/15 0438  BP: 113/48 118/60  Pulse: 84 85  Temp:  98.3 F (36.8 C)  Resp: 20 20     General: Patient is in no acute distress he is awake and alert, stated feeling better today  Cardiovascular: Regular rate rhythm normal S1-S2 no murmurs gallops  Respiratory: Patient having a few bibasilar crackles, diminished breath sounds bilaterally.  Abdomen: Obese, soft, nontender nondistended  Musculoskeletal: No edema  Discharge Instructions   Discharge Instructions    Call MD for:  difficulty breathing, headache or visual disturbances    Complete by:  As directed      Call MD for:  extreme fatigue    Complete by:  As directed      Call MD for:  hives    Complete by:  As directed      Call MD for:  persistant dizziness or light-headedness    Complete by:  As directed      Call MD for:  persistant nausea and vomiting    Complete by:  As directed      Call MD for:  redness, tenderness, or signs of infection (pain, swelling, redness, odor or green/yellow discharge around incision site)    Complete by:  As directed       Call MD for:  severe uncontrolled pain    Complete by:  As directed      Call MD for:  temperature >100.4    Complete by:  As directed      Call MD for:    Complete by:  As directed      Diet - low sodium heart healthy    Complete by:  As directed      Increase activity slowly    Complete by:  As directed           Current Discharge Medication List    CONTINUE these medications which have NOT CHANGED   Details  amitriptyline (ELAVIL) 25 MG tablet Take 25 mg by mouth at bedtime.    clonazePAM (KLONOPIN) 0.5 MG tablet Take 1 mg by mouth 2 (two) times daily as needed for anxiety.     lanthanum (FOSRENOL) 1000 MG chewable tablet Chew 2 tablets (2,000 mg total) by mouth 3 (three) times daily with meals. Qty: 180 tablet, Refills: 0    metoCLOPramide (REGLAN) 10 MG tablet Take 10 mg by mouth daily. Refills: 11    multivitamin (RENA-VIT) TABS tablet Take 1 tablet by mouth at bedtime. Qty: 30 each, Refills: 0    Omega-3  1000 MG CAPS Take 1 g by mouth 2 (two) times daily.    omeprazole (PRILOSEC) 20 MG capsule Take 20 mg by mouth daily. Refills: 10    Oxycodone HCl 10 MG TABS Take 1 tablet (10 mg total) by mouth 3 (three) times daily as needed. Qty: 30 tablet, Refills: 0    vitamin E 400 UNIT capsule Take 400 Units by mouth daily.    albuterol (PROVENTIL HFA;VENTOLIN HFA) 108 (90 BASE) MCG/ACT inhaler Inhale 1 puff into the lungs every 6 (six) hours as needed for wheezing or shortness of breath.      STOP taking these medications     pantoprazole (PROTONIX) 40 MG tablet        Allergies  Allergen Reactions  . Renvela [Sevelamer] Nausea And Vomiting   Follow-up Information    Follow up with [facility] On 04/07/2015.   Why:  apt  at 8 pm for sleep study   Contact information:   [address] [place] [address] [phone]      Follow up with [facility].   Why:  HHPT   Contact information:   [address] [place] [address] [phone]        The results of significant diagnostics from this hospitalization (including imaging, microbiology, ancillary and laboratory) are listed below for reference.    Significant Diagnostic Studies: Dg Chest 2 View  02/24/2015  CLINICAL DATA:  Acute onset of shortness of breath. Encephalopathy. Initial encounter. EXAM: CHEST  2 VIEW COMPARISON:  Chest radiograph performed 02/18/2015 FINDINGS: The lungs are hypoexpanded. Vascular congestion and vascular crowding are noted, with increased interstitial markings, raising question for minimal interstitial edema. No definite pleural effusion or pneumothorax is seen. There is mild elevation of the right hemidiaphragm. The cardiomediastinal silhouette is borderline normal in size. A right-sided dual-lumen catheter is noted ending at the proximal right atrium. No acute osseous abnormalities are seen. IMPRESSION: Lungs hypoexpanded. Vascular congestion noted, with increased interstitial markings, raising question for minimal interstitial edema. Mild elevation of the right hemidiaphragm. Electronically Signed   By: Roanna Raider M.D.   On: 02/24/2015 00:42   Ct Head Wo Contrast  02/23/2015  CLINICAL DATA:  Altered mental status EXAM: CT HEAD WITHOUT CONTRAST TECHNIQUE: Contiguous axial images were obtained from the base of the skull through the vertex without intravenous contrast. COMPARISON:  None. FINDINGS: Bony calvarium is intact. No findings to suggest acute hemorrhage, acute infarction or space-occupying mass lesion are noted. Mild motion artifact is noted. IMPRESSION: No acute abnormality noted. Electronically Signed   By: Alcide Clever M.D.   On: 02/23/2015 20:47   Dg Chest Port 1 View  02/18/2015  CLINICAL DATA:  Acute respiratory failure with hypoxia. Chronic systolic congestive heart failure. End-stage renal disease on hemodialysis. IJ catheter placement. EXAM: PORTABLE CHEST 1 VIEW COMPARISON:   02/17/2015 FINDINGS: New right jugular dual-lumen central venous catheter is seen with TIPS overlying the distal SVC in proximal right atrium. No pneumothorax visualized. Low lung volumes are seen. Cardiomegaly stable. Mild right basilar atelectasis shows no significant change. IMPRESSION: New right jugular central venous catheter in appropriate position. No evidence of pneumothorax. Electronically Signed   By: Myles Rosenthal M.D.   On: 02/18/2015 16:55   Dg Chest Port 1 View  02/17/2015  CLINICAL DATA:  Dyspnea.  Evaluate pulmonary edema. EXAM: PORTABLE CHEST 1 VIEW COMPARISON:  02/16/2015 and 02/03/2015 FINDINGS: Cardiac leads project over the chest.  Stable enlarged cardiac silhouette. Prominent central pulmonary vascularity appears similar to recent priors. Lung fields are clear. No evidence of pulmonary edema. Improved visualization of the left hemidiaphragm compared to yesterday's chest radiograph suggests improved aeration in the left lung base. No visible pleural effusion. IMPRESSION: Central pulmonary vascular congestion without active pulmonary edema. Improved aeration of the left lung base. Electronically Signed   By: Britta Mccreedy M.D.   On: 02/17/2015 09:22   Dg Chest Port 1 View  02/16/2015  CLINICAL DATA:  Difficulty breathing. Bronchitis for 1 week. On home oxygen. On home dialysis, last treatment 5 days ago. EXAM: PORTABLE CHEST 1 VIEW COMPARISON:  Chest radiograph February 03, 2015 FINDINGS: Cardiac silhouette is mildly enlarged. Mediastinal silhouette is nonsuspicious. Low inspiratory portable examination with prominent vascular shadows. Elevated RIGHT hemidiaphragm. Mild suspected retrocardiac consolidation. No pneumothorax. Large body habitus. Osseous structure nonsuspicious. IMPRESSION: Stable cardiomegaly and pulmonary vascular congestion. Similar suspected retrocardiac consolidation. Electronically Signed   By: Awilda Metro M.D.   On: 02/16/2015 05:03   Dg Chest Port 1  View  02/03/2015  CLINICAL DATA:  Acute respiratory failure with hypoxemia, end-stage renal disease on dialysis, chronic CHF. , intubated patient. EXAM: PORTABLE CHEST 1 VIEW COMPARISON:  Portable chest x-ray of February 02, 2015 FINDINGS: The lungs are adequately inflated. The interstitial markings are mildly increased. The central pulmonary vascularity remains engorged. The cardiac silhouette remains enlarged. The retrocardiac region remains dense. The endotracheal tube tip lies 5.8 cm above the carina. The esophagogastric tube tip projects below the inferior margin of the image. IMPRESSION: Stable mild CHF and with cardiomegaly. Left lower lobe atelectasis and trace left pleural effusion are suspected. The support tubes are in reasonable position. Electronically Signed   By: David  [place] M.D.   On: 02/03/2015 07:23   Dg Chest Port 1 View  02/02/2015  CLINICAL DATA:  Acute respiratory failure. EXAM: PORTABLE CHEST 1 VIEW COMPARISON:  02/01/2015 and 06/26/2014 FINDINGS: Endotracheal tube and NG tube appear in good position. Chronic cardiomegaly. Slight residual pulmonary vascular congestion, improved. No infiltrates or effusions. IMPRESSION: Improved pulmonary vascular congestion.  Chronic cardiomegaly. Electronically Signed   By: Francene Boyers M.D.   On: 02/02/2015 07:51   Dg Chest Port 1 View  02/02/2015  CLINICAL DATA:  Patient on hormone dialysis. Acute onset of shortness of breath, with rhonchi. Initial encounter. EXAM: PORTABLE CHEST 1 VIEW COMPARISON:  Chest radiograph performed 06/26/2014 FINDINGS: The lungs are hypoexpanded. Vascular congestion is noted, with increased interstitial markings, concerning for mild pulmonary edema. A small left pleural effusion is noted. No pneumothorax is seen. The cardiomediastinal silhouette is borderline normal in size. No acute osseous abnormalities are seen. IMPRESSION: Lungs hypoexpanded. Vascular congestion, with increased interstitial markings, concerning  for mild pulmonary edema. Small left pleural effusion noted. Electronically Signed   By: Roanna Raider M.D.   On: 02/02/2015 00:05   Dg Fluoro Guide Cv Line-no Report  02/18/2015  CLINICAL DATA:  FLOURO GUIDE CV LINE Fluoroscopy was utilized by the requesting physician.  No radiographic interpretation.   Ir Shuntogram/ Fistulagram Left Mod Sed  02/04/2015  CLINICAL DATA:  Dialysis fistula malfunction. Difficult cannulization. Reportedly, the patient underwent recent balloon angioplasty. EXAM: LEFT UPPER EXTREMITY FISTULOGRAM Physician: Rachelle Hora. Henn, MD FLUOROSCOPY TIME:  1 minutes and 18 seconds.  306.1 mGy MEDICATIONS AND MEDICAL HISTORY: None ANESTHESIA/SEDATION: Moderate sedation time: None CONTRAST:  58 mL Omnipaque 300 PROCEDURE: The left forearm fistula was accessed with an Angiocath. A series of fistulogram images were  obtained. The catheter was removed with manual compression. Bandage placed over the puncture site. FINDINGS: There is a distal forearm cephalic vein fistula. The fistula is patent. 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 Mild irregularity and mild narrowing of the cephalic vein in the mid forearm. There is drainage through the cephalic and brachial venous system. Central veins are patent. Arterial anastomosis is patent. Estimated blood loss: Minimal IMPRESSION: Mild irregularity of the cephalic vein in the mid forearm but no significant stenosis. No intervention was performed. Electronically Signed   By: Richarda Overlie M.D.   On: 02/04/2015 17:11    Microbiology: Recent Results (from the past 240 hour(s))  Culture, blood (routine x 2)     Status: None (Preliminary result)   Collection Time: 02/24/15 10:45 AM  Result Value Ref Range Status   Specimen Description BLOOD RIGHT HAND  Final   Special Requests BOTTLES DRAWN AEROBIC AND ANAEROBIC 5CC  Final   Culture NO GROWTH 1 DAY  Final   Report Status PENDING  Incomplete  Culture, blood (routine x 2)      Status: None (Preliminary result)   Collection Time: 02/24/15  4:08 PM  Result Value Ref Range Status   Specimen Description BLOOD HEMODIALYSIS CATHETER  Final   Special Requests BOTTLES DRAWN AEROBIC AND ANAEROBIC 10CC  Final   Culture NO GROWTH < 24 HOURS  Final   Report Status PENDING  Incomplete     Labs: Basic Metabolic Panel:  Recent Labs Lab 02/19/15 1406 02/23/15 1949 02/24/15 1409  NA 138 138 137  K 5.7* 4.3 5.3*  CL 99* 99* 97*  CO2 23 25 24   GLUCOSE 121* 88 103*  BUN 99* 50* 67*  CREATININE 20.83* 14.19* 16.52*  CALCIUM 6.1* 8.9 7.9*   Liver Function Tests:  Recent Labs Lab 02/23/15 1949 02/24/15 1409  AST 19 17  ALT 17 15*  ALKPHOS 65 61  BILITOT 0.6 0.6  PROT 7.5 6.7  ALBUMIN 3.5 3.2*   No results for input(s): LIPASE, AMYLASE in the last 168 hours.  Recent Labs Lab 02/23/15 2147  AMMONIA 20   CBC:  Recent Labs Lab 02/19/15 1406 02/23/15 1949 02/24/15 1409  WBC 6.8 5.6 6.0  HGB 9.7* 10.0* 10.1*  HCT 32.7* 34.4* 32.9*  MCV 95.9 95.3 94.3  PLT 120* 137* 138*   Cardiac Enzymes: No results for input(s): CKTOTAL, CKMB, CKMBINDEX, TROPONINI in the last 168 hours. BNP: BNP (last 3 results)  Recent Labs  06/25/14 1317 02/02/15 0035  BNP 1060.8* 485.4*    ProBNP (last 3 results) No results for input(s): PROBNP in the last 8760 hours.  CBG:  Recent Labs Lab 02/24/15 2057 02/25/15 0805 02/25/15 1204 02/25/15 2202 02/26/15 0751  GLUCAP 81 98 100* 115* 90       Signed:  Alexander Aument  [facility] 02/26/2015, 11:48 AM

## 2015-02-26 NOTE — Progress Notes (Signed)
CSW received consult for transportation needs so patient could get to dialysis upon discharge home. CSW spoke w/ pt about transportation needs. Pt reported not needing any transportation once he is discharged home. Pt already has [employer] for transportation.   CSW signing off.  Osborne Casco Linell Shawn LCSWA [phone]

## 2015-02-27 ENCOUNTER — Encounter: Payer: Self-pay | Admitting: Vascular Surgery

## 2015-03-01 LAB — CULTURE, BLOOD (ROUTINE X 2)
CULTURE: NO GROWTH
Culture: NO GROWTH

## 2015-03-03 ENCOUNTER — Telehealth: Payer: Self-pay

## 2015-03-03 NOTE — Telephone Encounter (Signed)
rec'd call from nurse @ [facility] center.  Reported the pt. Has approx. 1/4" opening in left AVF incision.  Reported yellow drainage.  Stated it was cultured today at HD.  Reported he was started on Vancomycin today after HD tx.  Has appt. 03/05/15.  Offered to schedule appt. 12/14; nurse stated pt. is not able to come in tomorrow, due to not being able to arrange transportation this late.  Advised since started on IV Vanc., will keep appt. with Dr. Darrick Penna on 12/15.  Nurse agreed, and will inform pt.

## 2015-03-05 ENCOUNTER — Ambulatory Visit (INDEPENDENT_AMBULATORY_CARE_PROVIDER_SITE_OTHER): Payer: Medicare Other | Admitting: Vascular Surgery

## 2015-03-05 ENCOUNTER — Encounter: Payer: Self-pay | Admitting: Vascular Surgery

## 2015-03-05 VITALS — BP 119/73 | HR 84 | Temp 98.0°F | Resp 20 | Ht 74.0 in | Wt 313.5 lb

## 2015-03-05 DIAGNOSIS — N186 End stage renal disease: Secondary | ICD-10-CM

## 2015-03-05 DIAGNOSIS — Z992 Dependence on renal dialysis: Secondary | ICD-10-CM

## 2015-03-05 NOTE — Progress Notes (Signed)
Patient is a 54 year old male who returns for postoperative follow-up after recent placement of a Diatek catheter as well as a left brachiocephalic AV fistula. The incision was through an old keloid and was closed primarily with nylon sutures due to concerns with wound healing. He denies any significant drainage from the wound. He denies any numbness or tingling in his hand.  Physical exam:  Filed Vitals:   03/05/15 1221  BP: 119/73  Pulse: 84  Temp: 98 F (36.7 C)  TempSrc: Oral  Resp: 20  Height: 6\' 2"  (1.88 m)  Weight: 313 lb 8 oz (142.203 kg)  SpO2: 90%    Left antecubital area with a 1 cm area of separation over the lateral aspect of the wound which is clean audible bruit palpable thrill in fistula sutures were removed today  Assessment: Maturing left arm AV fistula with small area of separation near area of prior keloid formation. There plan: Patient will apply antibiotic ointment to the small area of separation he will follow-up with me in a few weeks if it has not completely healed. Otherwise he'll return in 6 weeks with a duplex ultrasound to assess maturity of his fistula.  Plan: See above  Fabienne Bruns, MD [facility] Office: [phone] Pager: [phone]

## 2015-03-05 NOTE — Addendum Note (Signed)
Addended by: Adria Dill L on: 03/05/2015 02:29 PM   Modules accepted: Orders

## 2015-03-08 ENCOUNTER — Emergency Department (INDEPENDENT_AMBULATORY_CARE_PROVIDER_SITE_OTHER)
Admission: EM | Admit: 2015-03-08 | Discharge: 2015-03-08 | Disposition: A | Discharge status: Home / Self Care | Payer: Medicare Other | Source: Home / Self Care

## 2015-03-08 ENCOUNTER — Encounter (HOSPITAL_COMMUNITY): Payer: Self-pay | Admitting: Emergency Medicine

## 2015-03-08 DIAGNOSIS — S41102A Unspecified open wound of left upper arm, initial encounter: Secondary | ICD-10-CM | POA: Diagnosis not present

## 2015-03-08 NOTE — ED Notes (Signed)
Patient reports vascular removed sutures from left ac.  Unsure about when surgery took place.  Reports when sutures were removed, site was closed.  Over the last 3-4 days patient reports site opened.  Currently site is open.

## 2015-03-08 NOTE — Discharge Instructions (Signed)
The wound is not infected. Please do wet-to-dry dressing changes twice a day. - Remove old bandage. - Wash the wound with soap and water. - Pat dry. - Get a small piece of gauze wet with saline and place it over the wound. - Cover with a dry gauze and tape in place. Make sure you let your primary care doctor see the wound tomorrow at your appointment.

## 2015-03-08 NOTE — ED Provider Notes (Addendum)
CSN: [id number]     Arrival date & time 03/08/15  1314 History   None    Chief Complaint  Patient presents with  . Wound Check   (Consider location/radiation/quality/duration/timing/severity/associated sxs/prior Treatment) HPI He is a 54 year old man here for evaluation of left arm wound. He states he had an AV fistula placed sometime ago. He was seen by the vascular surgeon about 3 days ago and the stitches were removed. He states that since then a small area has opened up. He denies any bleeding or drainage. Denies any pain or redness.  Past Medical History  Diagnosis Date  . ESRD on hemodialysis (HCC)     Started dialysis with PD in 2013 and switched to HD in spring 2014 because of fungal peritonitis.  Takes dialysis at home 5 days a week approx 3h 15 min each session.     . CHF (congestive heart failure) (HCC)   . Peritoneal dialysis catheter in place [facility])   . Anemia     patient reporats that last hgb 7.9 a week ago  . Anxiety state, unspecified   . Hypertension    Past Surgical History  Procedure Laterality Date  . Appendectomy    . Peritoneal catheter insertion    . Tee without cardioversion N/A 07/11/2013    Procedure: TRANSESOPHAGEAL ECHOCARDIOGRAM (TEE);  Surgeon: Pricilla Riffle, MD;  Location: [facility] ENDOSCOPY;  Service: Cardiovascular;  Laterality: N/A;  . Cardioversion N/A 07/11/2013    Procedure: CARDIOVERSION;  Surgeon: Pricilla Riffle, MD;  Location: [facility] ENDOSCOPY;  Service: Cardiovascular;  Laterality: N/A;  . Knee arthroscopy Right 05/18/2014    Procedure: ARTHROSCOPIC WASHOUT OF RIGHT KNEE;  Surgeon: Sheral Apley, MD;  Location: [facility] OR;  Service: Orthopedics;  Laterality: Right;  . Av fistula placement Left 02/18/2015    Procedure: Left Brachial Cephalic ARTERIOVENOUS FISTULA CREATION;  Surgeon: Sherren Kerns, MD;  Location: [facility] OR;  Service: Vascular;  Laterality: Left;  . Insertion of dialysis catheter Right 02/18/2015    Procedure: INSERTION OF DIALYSIS CATHETER  Right Internal Jugular;  Surgeon: Sherren Kerns, MD;  Location: [facility] OR;  Service: Vascular;  Laterality: Right;   Family History  Problem Relation Age of Onset  . Diabetes Mother   . Hypertension Mother   . Hypertension Sister   . Asthma Sister    Social History  Substance Use Topics  . Smoking status: Never Smoker   . Smokeless tobacco: Never Used  . Alcohol Use: No    Review of Systems  Allergies  Renvela  Home Medications   Prior to Admission medications   Medication Sig Start Date End Date Taking? Authorizing Provider  albuterol (PROVENTIL HFA;VENTOLIN HFA) 108 (90 BASE) MCG/ACT inhaler Inhale 1 puff into the lungs every 6 (six) hours as needed for wheezing or shortness of breath.    Historical Provider, MD  amitriptyline (ELAVIL) 25 MG tablet Take 25 mg by mouth at bedtime.    Historical Provider, MD  clonazePAM (KLONOPIN) 0.5 MG tablet Take 1 mg by mouth 2 (two) times daily as needed for anxiety.     Historical Provider, MD  lanthanum (FOSRENOL) 1000 MG chewable tablet Chew 2 tablets (2,000 mg total) by mouth 3 (three) times daily with meals. 02/05/15   Drema Dallas, MD  metoCLOPramide (REGLAN) 10 MG tablet Take 10 mg by mouth daily. 05/03/14   Historical Provider, MD  multivitamin (RENA-VIT) TABS tablet Take 1 tablet by mouth at bedtime. 05/21/14   [name], DO  Omega-3 1000 MG CAPS Take 1 g by mouth 2 (two) times daily.    Historical Provider, MD  omeprazole (PRILOSEC) 20 MG capsule Take 20 mg by mouth daily. 02/10/15   Historical Provider, MD  Oxycodone HCl 10 MG TABS Take 1 tablet (10 mg total) by mouth 3 (three) times daily as needed. Patient not taking: Reported on 03/05/2015 05/21/14   [name], DO  vitamin E 400 UNIT capsule Take 400 Units by mouth daily.    Historical Provider, MD   Meds Ordered and Administered this Visit  Medications - No data to display  BP 105/63 mmHg  Pulse 85  Temp(Src) 98.5 F (36.9 C) (Oral)  Resp 24  SpO2 96% No data  found.   Physical Exam  Constitutional: He is oriented to person, place, and time. He appears well-developed and well-nourished. No distress.  Cardiovascular: Normal rate.   Pulmonary/Chest: Effort normal.  On nasal cannula  Neurological: He is alert and oriented to person, place, and time.  Skin:  He has a 1 x 1.5 cm open window in the left antecubital fossa. Surrounding skin is hyperkeratotic and keloid in nature. The wound is mostly covered with a yellow proteinaceous material. No erythema or drainage to suggest infection.  There is a palpable thrill.    ED Course  Procedures (including critical care time)  Labs Review Labs Reviewed - No data to display  Imaging Review No results found.    MDM   1. Arm wound, left, initial encounter    No sign of infection. Recommended wet-to-dry dressing changes twice a day. He has an appointment with his primary care doctor tomorrow. I recommended that he showed them the wound so they can continue to monitor it.    Charm Rings, MD 03/08/15 1436  Charm Rings, MD 03/08/15 [phone]

## 2015-03-09 ENCOUNTER — Inpatient Hospital Stay: Payer: Medicare Other | Admitting: Pulmonary Disease

## 2015-03-10 ENCOUNTER — Inpatient Hospital Stay: Payer: Medicare Other | Admitting: Adult Health

## 2015-03-12 ENCOUNTER — Ambulatory Visit (INDEPENDENT_AMBULATORY_CARE_PROVIDER_SITE_OTHER): Payer: Medicare Other | Admitting: Internal Medicine

## 2015-03-12 ENCOUNTER — Encounter: Payer: Self-pay | Admitting: Internal Medicine

## 2015-03-12 VITALS — BP 122/74 | HR 90 | Ht 74.0 in | Wt 310.6 lb

## 2015-03-12 DIAGNOSIS — J383 Other diseases of vocal cords: Secondary | ICD-10-CM | POA: Diagnosis not present

## 2015-03-12 DIAGNOSIS — J9611 Chronic respiratory failure with hypoxia: Secondary | ICD-10-CM

## 2015-03-12 NOTE — Assessment & Plan Note (Signed)
pfts 01/31/12  FEV1  2.54 (65%) ratio 83  And dlco 47 > corrects to 91 for alv vol and ERV 27% due to body habitus 03/12/2015   Walked 2lpm x one lap @ 185 stopped due to  Sob with  02 sats 89 at end / slow pace   rec 03/12/2015 2lpm 24/7 but ok to increase to 3lpm just with exertion   Most likely this is due to combination of chronic vol overload/ chf/obesity  not intrinsic lung dz   I had an extended discussion with the patient reviewing all relevant studies completed to date and  lasting 15 to 20 minutes of a 25 minute visit    Each maintenance medication was reviewed in detail including most importantly the difference between maintenance and prns and under what circumstances the prns are to be triggered using an action plan format that is not reflected in the computer generated alphabetically organized AVS.    Please see instructions for details which were reviewed in writing and the patient given a copy highlighting the part that I personally wrote and discussed at today's ov.

## 2015-03-12 NOTE — Assessment & Plan Note (Addendum)
Eval by Suszanne Conners 09/22/11  PFT's 01/31/12 Pos moderate insp truncation > 02/01/2012 rec aggressive gerd Rx then back to [facility] if not improving symptomatically - spirometry 03/12/2015 s obst on expiration > full pfts with f/v loop rec   Not clear what if anything can be done for his breathing/ hoarseness as really no better x 3 years but re-emphasized gerd rx diet and meds  And will arrange f/u with full pfts  I had an extended discussion with the patient reviewing all relevant studies completed to date including hosp notes  and  lasting 25 minutes of a visit to re establish care    Each maintenance medication was reviewed in detail including most importantly the difference between maintenance and as needed and under what circumstances the prns are to be used.  Please see instructions for details which were reviewed in writing and the patient given a copy.

## 2015-03-12 NOTE — Assessment & Plan Note (Signed)
ERV 27% on pfts 01/31/12 c/w body habitus effects on lung volumes   Body mass index is 39.86   Lab Results  Component Value Date   TSH 4.095 06/25/2014     Contributing to gerd tendency/ doe/reviewed the need and the process to achieve and maintain neg calorie balance > defer f/u primary care including intermittently monitoring thyroid status

## 2015-03-12 NOTE — Patient Instructions (Addendum)
02 should be 2lpm 24/7 but ok to increase to 3lpm briefly with exertion   Continue the prilosec 20 mg Take 30-60 min before first meal of the day   GERD (REFLUX)  is an extremely common cause of respiratory symptoms just like yours , many times with no obvious heartburn at all.    It can be treated with medication, but also with lifestyle changes including elevation of the head of your bed (ideally with 6 inch  bed blocks),  Smoking cessation, avoidance of late meals, excessive alcohol, and avoid fatty foods, chocolate, peppermint, colas, red wine, and acidic juices such as orange juice.  NO MINT OR MENTHOL PRODUCTS SO NO COUGH DROPS  USE SUGARLESS CANDY INSTEAD (Jolley ranchers or Stover's or Life Savers) or even ice chips will also do - the key is to swallow to prevent all throat clearing. NO OIL BASED VITAMINS - use powdered substitutes.    Follow up in 3 months with full pfts then

## 2015-03-12 NOTE — Progress Notes (Signed)
Patient ID: Gary Rivera, male   DOB: 1960/07/11,    MRN: [id number]   Brief patient profile:  1 yobm never smoker  with esrf on PD 2009 and needed prn inhaler 2011 but increased sob/"wheezing" and so started on maint rx qvar Feb 2013 (but   non-adherent) then June 2013 with intubation at [facility] due to vol overload on vent about 10 days with neg ent eval by Suszanne Conners p extubation referred by Dr Louis Meckel urologist in [facility] for eval of ? Stridor.   History of Present Illness  12/30/2011 1st pulmonary eval cc not limited by breathing, doing water aerobics ok, but occ wheezing and coughing better p restart qvar and saba, cough mostly dry and after supper worst.  rec Qvar 80 (rust)  2 puffs each am then rinse and gargle and brush your teeth. Pepcid 20 mg one at bedtime Only use your albuterol (proaire red inhaler) as a rescue medication   GERD diet  01/31/12 pfts restrictive with disproportionate reduction in ERV    02/01/2012 f/u ov/Zarin Hagmann on qvar s need for saba cc only complaint is hoarseness which is overall improving but   has audible mild  insp stridor>  denies limiting doe   rec I recommend bystolic 5 mg one daily Stop labetolol Decrease the qvar to just one each am Try prilosec (omeprazole)   Take 30-60 min before first meal of the day and Pepcid 20 mg one bedtime until return     03/09/2012 f/u ov/Lissy Deuser cc much better just using qvar once or twice weekly and no sense of need for daytime saba at all  rec Stay on prilosec before bast and pepcid at bedtime until and if breathing gets worse in meantime call for referral back to Dr Suszanne Conners, a throat specialist. Stay off qvar GERD diet  F/u 3 m > did not return      Admit date: 02/23/2015 Discharge date: 02/26/2015    Recommendations for Outpatient Follow-up:  1. Please follow up on respiratory status, he was discharged on continuous oxygen. 2. Prior to discharge, he was set up with Home Health Services for Home PT 3. He received HD on  02/26/2015 prior to discharge and will be resuming normal scheduled HD   Discharge Diagnoses:    Chronic systolic congestive heart failure, NYHA class 1 (HCC)  HTN (hypertension)  Anemia  Pulmonary HTN- 50 mmHg 2011 ECHO  Obesity (BMI 30-39.9)  Thrombocytopenia (HCC)  Obesity hypoventilation syndrome (HCC)  Atrial flutter with RVR  End stage renal disease- on HD  NICM- recent EF "Nl" per pt- followed at [facility]  Encephalopathy   History of Present Illness  03/12/2015 1st [facility] office visit/ Elius Etheredge re: post hosp f/u/ re-establish care as not seen > 3 y Chief Complaint  Patient presents with  . HFU    Pt c/o min non prod cough. He states that his breathing has improved back to normal baseline for him. He is on o2 2lpm 24/7.  doing better on 02 / last used saba 2 days prior to OV  With baseline doe x 100 ft flat  Ok hs  Some correlation with HD > breathing better p it  Has not seen [facility] in years, no cards eval x one year    No obvious day to day or daytime variability or assoc excess/ purulent sputum or mucus plugs  or cp or chest tightness, subjective wheeze or overt sinus or hb symptoms. No unusual exp hx or h/o childhood pna/ asthma  or knowledge of premature birth.  Sleeping ok without nocturnal  or early am exacerbation  of respiratory  c/o's or need for noct saba. Also denies any obvious fluctuation of symptoms with weather or environmental changes or other aggravating or alleviating factors except as outlined above   Current Medications, Allergies, Complete Past Medical History, Past Surgical History, Family History, and Social History were reviewed in [employer] record.  ROS  The following are not active complaints unless bolded sore throat, dysphagia, dental problems, itching, sneezing,  nasal congestion or excess/ purulent secretions, ear ache,   fever, chills, sweats, unintended wt loss, classically pleuritic or exertional cp,  hemoptysis,  orthopnea pnd or leg swelling, presyncope, palpitations, abdominal pain, anorexia, nausea, vomiting, diarrhea  or change in bowel or bladder habits, change in stools or urine, dysuria,hematuria,  rash, arthralgias, visual complaints, headache, numbness, weakness or ataxia or problems with walking or coordination,  change in mood/affect or memory.      Objective:   Physical Exam    amb obese very hoarse  bm with extremely limited insight into all details of care     Wt Readings from Last 3 Encounters:  03/05/15 313 lb 8 oz (142.203 kg)  02/26/15 306 lb 7 oz (139 kg)  02/19/15 309 lb 14.4 oz (140.57 kg)    Vital signs reviewed      HEENT: nl dentition, turbinates, and oropharynx. Nl external ear canals without cough reflex   NECK :  without JVD/Nodes/TM/ nl carotid upstrokes bilaterally   LUNGS: no acc muscle use, clear to A and P bilaterally without cough on insp or exp maneuvers   CV:  RRR  no s3 or murmur or increase in P2, no edema   ABD:  soft and nontender with nl excursion in the supine position. No bruits or organomegaly, bowel sounds nl  MS:  warm without deformities, calf tenderness, cyanosis or clubbing  SKIN: warm and dry without lesions    NEURO:  alert, approp, no deficits     I personally reviewed images and agree with radiology impression as follows:  CXR: 02/23/15 Lungs hypoexpanded. Vascular congestion noted, with increased interstitial markings, raising question for minimal interstitial edema. Mild elevation of the right hemidiaphragm.      Assessment:

## 2015-03-16 ENCOUNTER — Encounter: Payer: Self-pay | Admitting: Internal Medicine

## 2015-03-24 ENCOUNTER — Inpatient Hospital Stay (HOSPITAL_COMMUNITY)
Admission: EM | Admit: 2015-03-24 | Discharge: 2015-03-27 | DRG: 208 | Disposition: A | Discharge status: Home / Self Care | Payer: Medicare Other | Attending: Internal Medicine | Admitting: Internal Medicine

## 2015-03-24 ENCOUNTER — Emergency Department (HOSPITAL_COMMUNITY): Payer: Medicare Other

## 2015-03-24 ENCOUNTER — Inpatient Hospital Stay (HOSPITAL_COMMUNITY): Payer: Medicare Other

## 2015-03-24 ENCOUNTER — Encounter (HOSPITAL_COMMUNITY): Payer: Self-pay | Admitting: Neurology

## 2015-03-24 DIAGNOSIS — I132 Hypertensive heart and chronic kidney disease with heart failure and with stage 5 chronic kidney disease, or end stage renal disease: Secondary | ICD-10-CM | POA: Diagnosis present

## 2015-03-24 DIAGNOSIS — R739 Hyperglycemia, unspecified: Secondary | ICD-10-CM | POA: Diagnosis present

## 2015-03-24 DIAGNOSIS — J9801 Acute bronchospasm: Secondary | ICD-10-CM | POA: Diagnosis present

## 2015-03-24 DIAGNOSIS — E876 Hypokalemia: Secondary | ICD-10-CM | POA: Diagnosis present

## 2015-03-24 DIAGNOSIS — Z6841 Body Mass Index (BMI) 40.0 and over, adult: Secondary | ICD-10-CM | POA: Diagnosis not present

## 2015-03-24 DIAGNOSIS — I272 Other secondary pulmonary hypertension: Secondary | ICD-10-CM | POA: Diagnosis present

## 2015-03-24 DIAGNOSIS — I429 Cardiomyopathy, unspecified: Secondary | ICD-10-CM | POA: Diagnosis present

## 2015-03-24 DIAGNOSIS — G894 Chronic pain syndrome: Secondary | ICD-10-CM | POA: Diagnosis present

## 2015-03-24 DIAGNOSIS — J9621 Acute and chronic respiratory failure with hypoxia: Secondary | ICD-10-CM | POA: Diagnosis present

## 2015-03-24 DIAGNOSIS — D638 Anemia in other chronic diseases classified elsewhere: Secondary | ICD-10-CM | POA: Diagnosis present

## 2015-03-24 DIAGNOSIS — Z992 Dependence on renal dialysis: Secondary | ICD-10-CM | POA: Diagnosis not present

## 2015-03-24 DIAGNOSIS — I959 Hypotension, unspecified: Secondary | ICD-10-CM | POA: Diagnosis present

## 2015-03-24 DIAGNOSIS — I5022 Chronic systolic (congestive) heart failure: Secondary | ICD-10-CM | POA: Diagnosis present

## 2015-03-24 DIAGNOSIS — J9690 Respiratory failure, unspecified, unspecified whether with hypoxia or hypercapnia: Secondary | ICD-10-CM

## 2015-03-24 DIAGNOSIS — E669 Obesity, unspecified: Secondary | ICD-10-CM | POA: Diagnosis present

## 2015-03-24 DIAGNOSIS — I340 Nonrheumatic mitral (valve) insufficiency: Secondary | ICD-10-CM | POA: Diagnosis present

## 2015-03-24 DIAGNOSIS — E874 Mixed disorder of acid-base balance: Secondary | ICD-10-CM | POA: Diagnosis present

## 2015-03-24 DIAGNOSIS — N186 End stage renal disease: Secondary | ICD-10-CM | POA: Diagnosis present

## 2015-03-24 DIAGNOSIS — E662 Morbid (severe) obesity with alveolar hypoventilation: Secondary | ICD-10-CM | POA: Diagnosis present

## 2015-03-24 DIAGNOSIS — R5383 Other fatigue: Secondary | ICD-10-CM | POA: Diagnosis present

## 2015-03-24 DIAGNOSIS — Z452 Encounter for adjustment and management of vascular access device: Secondary | ICD-10-CM | POA: Diagnosis not present

## 2015-03-24 DIAGNOSIS — R0600 Dyspnea, unspecified: Secondary | ICD-10-CM | POA: Diagnosis not present

## 2015-03-24 DIAGNOSIS — G9340 Encephalopathy, unspecified: Secondary | ICD-10-CM | POA: Diagnosis present

## 2015-03-24 DIAGNOSIS — I10 Essential (primary) hypertension: Secondary | ICD-10-CM | POA: Diagnosis present

## 2015-03-24 DIAGNOSIS — J9620 Acute and chronic respiratory failure, unspecified whether with hypoxia or hypercapnia: Secondary | ICD-10-CM | POA: Diagnosis not present

## 2015-03-24 DIAGNOSIS — Z9115 Patient's noncompliance with renal dialysis: Secondary | ICD-10-CM | POA: Diagnosis not present

## 2015-03-24 DIAGNOSIS — J9622 Acute and chronic respiratory failure with hypercapnia: Secondary | ICD-10-CM | POA: Diagnosis present

## 2015-03-24 LAB — BASIC METABOLIC PANEL
Anion gap: 16 — ABNORMAL HIGH (ref 5–15)
BUN: 69 mg/dL — AB (ref 6–20)
CHLORIDE: 101 mmol/L (ref 101–111)
CO2: 26 mmol/L (ref 22–32)
CREATININE: 18.23 mg/dL — AB (ref 0.61–1.24)
Calcium: 7.9 mg/dL — ABNORMAL LOW (ref 8.9–10.3)
GFR calc Af Amer: 3 mL/min — ABNORMAL LOW (ref >60)
GFR calc non Af Amer: 2 mL/min — ABNORMAL LOW (ref >60)
GLUCOSE: 158 mg/dL — AB (ref 65–99)
POTASSIUM: 4.1 mmol/L (ref 3.5–5.1)
Sodium: 143 mmol/L (ref 135–145)

## 2015-03-24 LAB — I-STAT CHEM 8, ED
BUN: 70 mg/dL — ABNORMAL HIGH (ref 6–20)
CALCIUM ION: 0.98 mmol/L — AB (ref 1.12–1.23)
CHLORIDE: 102 mmol/L (ref 101–111)
Creatinine, Ser: 16.9 mg/dL — ABNORMAL HIGH (ref 0.61–1.24)
GLUCOSE: 149 mg/dL — AB (ref 65–99)
HCT: 37 % — ABNORMAL LOW (ref 39.0–52.0)
Hemoglobin: 12.6 g/dL — ABNORMAL LOW (ref 13.0–17.0)
Potassium: 4.1 mmol/L (ref 3.5–5.1)
Sodium: 142 mmol/L (ref 135–145)
TCO2: 28 mmol/L (ref 0–100)

## 2015-03-24 LAB — GLUCOSE, CAPILLARY
GLUCOSE-CAPILLARY: 57 mg/dL — AB (ref 65–99)
GLUCOSE-CAPILLARY: 108 mg/dL — AB (ref 65–99)
Glucose-Capillary: 68 mg/dL (ref 65–99)

## 2015-03-24 LAB — CBC
HCT: 35.8 % — ABNORMAL LOW (ref 39.0–52.0)
Hemoglobin: 10.1 g/dL — ABNORMAL LOW (ref 13.0–17.0)
MCH: 27.9 pg (ref 26.0–34.0)
MCHC: 28.2 g/dL — AB (ref 30.0–36.0)
MCV: 98.9 fL (ref 78.0–100.0)
PLATELETS: 200 10*3/uL (ref 150–400)
RBC: 3.62 MIL/uL — AB (ref 4.22–5.81)
RDW: 16.4 % — AB (ref 11.5–15.5)
WBC: 6.6 10*3/uL (ref 4.0–10.5)

## 2015-03-24 LAB — AMMONIA: Ammonia: 34 umol/L (ref 9–35)

## 2015-03-24 LAB — TRIGLYCERIDES: Triglycerides: 163 mg/dL — ABNORMAL HIGH (ref <150)

## 2015-03-24 LAB — I-STAT TROPONIN, ED: Troponin i, poc: 0.06 ng/mL (ref 0.00–0.08)

## 2015-03-24 LAB — MRSA PCR SCREENING: MRSA BY PCR: NEGATIVE

## 2015-03-24 LAB — CBG MONITORING, ED: GLUCOSE-CAPILLARY: 144 mg/dL — AB (ref 65–99)

## 2015-03-24 LAB — I-STAT CG4 LACTIC ACID, ED: LACTIC ACID, VENOUS: 1.61 mmol/L (ref 0.5–2.0)

## 2015-03-24 LAB — PROCALCITONIN: Procalcitonin: 1.59 ng/mL

## 2015-03-24 MED ORDER — SODIUM CHLORIDE 0.9 % IV SOLN: 100.0000 mL | INTRAVENOUS | Status: DC | PRN

## 2015-03-24 MED ORDER — PANTOPRAZOLE SODIUM 40 MG IV SOLR
40.0000 mg | INTRAVENOUS | Status: DC
  Administered 2015-03-24 – 2015-03-25 (×2): 40 mg via INTRAVENOUS
  Filled 2015-03-24 (×3): qty 40

## 2015-03-24 MED ORDER — LIDOCAINE HCL (PF) 1 % IJ SOLN: 5.0000 mL | INTRAMUSCULAR | Status: DC | PRN

## 2015-03-24 MED ORDER — SODIUM CHLORIDE 0.9 % IV SOLN
INTRAVENOUS | Status: DC
  Administered 2015-03-25: 30 mL/h via INTRAVENOUS

## 2015-03-24 MED ORDER — CHLORHEXIDINE GLUCONATE 0.12% ORAL RINSE (MEDLINE KIT)
15.0000 mL | Freq: Two times a day (BID) | OROMUCOSAL | Status: DC
  Administered 2015-03-24 – 2015-03-26 (×4): 15 mL via OROMUCOSAL

## 2015-03-24 MED ORDER — FENTANYL CITRATE (PF) 100 MCG/2ML IJ SOLN
100.0000 ug | INTRAMUSCULAR | Status: DC | PRN
  Administered 2015-03-24: 100 ug via INTRAVENOUS
  Filled 2015-03-24: qty 2

## 2015-03-24 MED ORDER — ETOMIDATE 2 MG/ML IV SOLN
INTRAVENOUS | Status: AC | PRN
  Administered 2015-03-24: 20 mg via INTRAVENOUS

## 2015-03-24 MED ORDER — PROPOFOL 10 MG/ML IV BOLUS
INTRAVENOUS | Status: AC
  Filled 2015-03-24: qty 20

## 2015-03-24 MED ORDER — DEXTROSE 50 % IV SOLN
INTRAVENOUS | Status: AC
  Filled 2015-03-24: qty 50

## 2015-03-24 MED ORDER — ANTISEPTIC ORAL RINSE SOLUTION (CORINZ)
7.0000 mL | OROMUCOSAL | Status: DC
  Administered 2015-03-24 – 2015-03-26 (×12): 7 mL via OROMUCOSAL

## 2015-03-24 MED ORDER — ALTEPLASE 2 MG IJ SOLR
2.0000 mg | Freq: Once | INTRAMUSCULAR | Status: DC | PRN
  Filled 2015-03-24: qty 2

## 2015-03-24 MED ORDER — SUCCINYLCHOLINE CHLORIDE 20 MG/ML IJ SOLN
INTRAMUSCULAR | Status: AC | PRN
  Administered 2015-03-24: 100 mg via INTRAVENOUS

## 2015-03-24 MED ORDER — INSULIN ASPART 100 UNIT/ML ~~LOC~~ SOLN: 0.0000 [IU] | SUBCUTANEOUS | Status: DC

## 2015-03-24 MED ORDER — HEPARIN SODIUM (PORCINE) 1000 UNIT/ML DIALYSIS
40.0000 [IU]/kg | INTRAMUSCULAR | Status: DC | PRN
  Filled 2015-03-24: qty 6

## 2015-03-24 MED ORDER — DEXTROSE 50 % IV SOLN
50.0000 mL | Freq: Once | INTRAVENOUS | Status: AC
  Administered 2015-03-24: 50 mL via INTRAVENOUS

## 2015-03-24 MED ORDER — PROPOFOL 1000 MG/100ML IV EMUL
5.0000 ug/kg/min | INTRAVENOUS | Status: DC
  Administered 2015-03-24: 10 ug/kg/min via INTRAVENOUS
  Filled 2015-03-24: qty 100

## 2015-03-24 MED ORDER — LIDOCAINE-PRILOCAINE 2.5-2.5 % EX CREA
1.0000 "application " | TOPICAL_CREAM | CUTANEOUS | Status: DC | PRN
  Filled 2015-03-24: qty 5

## 2015-03-24 MED ORDER — PENTAFLUOROPROP-TETRAFLUOROETH EX AERO: 1.0000 "application " | INHALATION_SPRAY | CUTANEOUS | Status: DC | PRN

## 2015-03-24 MED ORDER — MIDAZOLAM HCL 2 MG/2ML IJ SOLN
INTRAMUSCULAR | Status: AC
  Administered 2015-03-24: 2 mg
  Filled 2015-03-24: qty 2

## 2015-03-24 MED ORDER — NOREPINEPHRINE BITARTRATE 1 MG/ML IV SOLN
0.0000 ug/min | INTRAVENOUS | Status: DC
  Administered 2015-03-25: 4 ug/min via INTRAVENOUS
  Filled 2015-03-24: qty 16

## 2015-03-24 MED ORDER — FENTANYL CITRATE (PF) 100 MCG/2ML IJ SOLN
50.0000 ug | Freq: Once | INTRAMUSCULAR | Status: AC
  Administered 2015-03-24: 50 ug via INTRAVENOUS
  Filled 2015-03-24: qty 2

## 2015-03-24 MED ORDER — PROPOFOL 1000 MG/100ML IV EMUL
0.0000 ug/kg/min | INTRAVENOUS | Status: DC
  Administered 2015-03-24: 50 ug/kg/min via INTRAVENOUS
  Administered 2015-03-24 (×2): 20 ug/kg/min via INTRAVENOUS
  Administered 2015-03-25: 25 ug/kg/min via INTRAVENOUS
  Administered 2015-03-25: 40 ug/kg/min via INTRAVENOUS
  Administered 2015-03-25: 45 ug/kg/min via INTRAVENOUS
  Administered 2015-03-25: 50 ug/kg/min via INTRAVENOUS
  Filled 2015-03-24: qty 200
  Filled 2015-03-24 (×5): qty 100

## 2015-03-24 MED ORDER — HEPARIN SODIUM (PORCINE) 1000 UNIT/ML DIALYSIS: 1000.0000 [IU] | INTRAMUSCULAR | Status: DC | PRN

## 2015-03-24 MED ORDER — HEPARIN SODIUM (PORCINE) 5000 UNIT/ML IJ SOLN
5000.0000 [IU] | Freq: Three times a day (TID) | INTRAMUSCULAR | Status: DC
  Administered 2015-03-24 – 2015-03-27 (×8): 5000 [IU] via SUBCUTANEOUS
  Filled 2015-03-24 (×10): qty 1

## 2015-03-24 MED ORDER — DEXTROSE 50 % IV SOLN
1.0000 | Freq: Once | INTRAVENOUS | Status: AC
  Administered 2015-03-25: 50 mL via INTRAVENOUS
  Filled 2015-03-24: qty 50

## 2015-03-24 NOTE — ED Notes (Signed)
EDP notified of pt. BP of 86/44 mmHG.

## 2015-03-24 NOTE — ED Notes (Signed)
Pt. Unable to keep O2 saturation up. Pt. Placed on NRB at this time. Pt. O2 saturation stable.

## 2015-03-24 NOTE — Progress Notes (Signed)
Paged resident X2.  Awaiting reply.

## 2015-03-24 NOTE — ED Notes (Signed)
MD made aware of patients ABG. Patient BP 67/46. MD verbal order 1L NS

## 2015-03-24 NOTE — H&P (Signed)
PULMONARY / CRITICAL CARE MEDICINE   Name: Gary Rivera MRN: [id number] DOB: 1961/02/26    ADMISSION DATE:  03/24/2015  REFERRING MD:  ER  CHIEF COMPLAINT:  Cant breath  HISTORY OF PRESENT ILLNESS:   Hx from chart.  55 yo male with hx of ESRD on HD.  He was last seen 4 days prior to admission.  He missed HD because he wasn't feeling well.  He was tx for bronchitis last month.  He was hypoxia, and ABG showed respiratory acidosis.  He required intubation.  PCCM asked to admit.  PAST MEDICAL HISTORY :  He  has a past medical history of ESRD on hemodialysis (HCC); CHF (congestive heart failure) (HCC); Peritoneal dialysis catheter in place [facility]); Anemia; Anxiety state, unspecified; and Hypertension.  PAST SURGICAL HISTORY: He  has past surgical history that includes Appendectomy; Peritoneal catheter insertion; TEE without cardioversion (N/A, 07/11/2013); Cardioversion (N/A, 07/11/2013); Knee arthroscopy (Right, 05/18/2014); AV fistula placement (Left, 02/18/2015); and Insertion of dialysis catheter (Right, 02/18/2015).  Allergies  Allergen Reactions  . Renvela [Sevelamer] Nausea And Vomiting    No current facility-administered medications on file prior to encounter.   Current Outpatient Prescriptions on File Prior to Encounter  Medication Sig  . albuterol (PROVENTIL HFA;VENTOLIN HFA) 108 (90 BASE) MCG/ACT inhaler Inhale 1 puff into the lungs every 6 (six) hours as needed for wheezing or shortness of breath.  Marland Kitchen amitriptyline (ELAVIL) 25 MG tablet Take 25 mg by mouth at bedtime. Reported on 03/12/2015  . clonazePAM (KLONOPIN) 0.5 MG tablet Take 1 mg by mouth 2 (two) times daily as needed for anxiety.   Marland Kitchen lanthanum (FOSRENOL) 1000 MG chewable tablet Chew 2 tablets (2,000 mg total) by mouth 3 (three) times daily with meals.  . metoCLOPramide (REGLAN) 10 MG tablet Take 10 mg by mouth daily.  . multivitamin (RENA-VIT) TABS tablet Take 1 tablet by mouth at bedtime.  . Omega-3 1000 MG CAPS  Take 1 g by mouth 2 (two) times daily.  Marland Kitchen omeprazole (PRILOSEC) 20 MG capsule Take 20 mg by mouth daily.  . Oxycodone HCl 10 MG TABS Take 1 tablet (10 mg total) by mouth 3 (three) times daily as needed.  . OXYGEN 2 lpm 24/7 AHC  . vitamin E 400 UNIT capsule Take 400 Units by mouth daily.    FAMILY HISTORY:  His indicated that his mother is deceased. He indicated that his sister is alive.   SOCIAL HISTORY: He  reports that he has never smoked. He has never used smokeless tobacco. He reports that he does not drink alcohol or use illicit drugs.  REVIEW OF SYSTEMS:   Unable to obtain  SUBJECTIVE:  Just intubated  VITAL SIGNS: BP 93/46 mmHg  Pulse 93  Resp 18  SpO2 97%  HEMODYNAMICS:    VENTILATOR SETTINGS:    INTAKE / OUTPUT:    PHYSICAL EXAMINATION: General:  Ill appearing Neuro:  Opens eyes spontaneously HEENT:  ETT in place Cardiovascular:  Regular, no murmur Lungs:  B/l crackles Abdomen:  Soft, nontender Musculoskeletal:  1+ edema Skin:  Scaling, HD cath Rt neck, keloid  LABS:  BMET  Recent Labs Lab 03/24/15 1200 03/24/15 1247  NA 143 142  K 4.1 4.1  CL 101 102  CO2 26  --   BUN 69* 70*  CREATININE 18.23* 16.90*  GLUCOSE 158* 149*    Electrolytes  Recent Labs Lab 03/24/15 1200  CALCIUM 7.9*    CBC  Recent Labs Lab 03/24/15 1200 03/24/15 1247  WBC  6.6  --   HGB 10.1* 12.6*  HCT 35.8* 37.0*  PLT 200  --     Coag's No results for input(s): APTT, INR in the last 168 hours.  Sepsis Markers  Recent Labs Lab 03/24/15 1253  LATICACIDVEN 1.61    ABG No results for input(s): PHART, PCO2ART, PO2ART in the last 168 hours.  Liver Enzymes No results for input(s): AST, ALT, ALKPHOS, BILITOT, ALBUMIN in the last 168 hours.  Cardiac Enzymes No results for input(s): TROPONINI, PROBNP in the last 168 hours.  Glucose  Recent Labs Lab 03/24/15 1251  GLUCAP 144*    Imaging Dg Chest Portable 1 View  03/24/2015  CLINICAL DATA:   Shortness of Breath.  Chronic renal failure. EXAM: PORTABLE CHEST 1 VIEW COMPARISON:  February 23, 2015 FINDINGS: There is persistent elevation of the right hemidiaphragm. There is no edema or consolidation. There is mild scarring in the left base. There is mild cardiomegaly with pulmonary vascularity within normal limits. Dual-lumen catheter with tips in superior vena cava. No pneumothorax. IMPRESSION: Central catheter as described without pneumothorax. No edema or consolidation. Slight scarring left base. Stable elevation right hemidiaphragm. Stable cardiac prominence. Electronically Signed   By: Bretta Bang III M.D.   On: 03/24/2015 13:20     STUDIES:   CULTURES: 1/03 Blood >> 1/03 Sputum >>  ANTIBIOTICS:  SIGNIFICANT EVENTS: 1/03 Admit, VDRF  LINES/TUBES: 1/03 ETT >>   DISCUSSION: 55 yo male with acute on chronic hypoxic/hypercapnic respiratory failure likely 2nd to uremia.  He has hx of ESRD on HD.  Had recent outpt pulmonary assessment by Dr. Sherene Sires for VCD and chronic hypoxic respiratory failure.  ASSESSMENT / PLAN:  PULMONARY A: Acute on chronic hypoxic/hypercapnic respiratory failure. Possible sleep disordered breathing. Elevated Rt diaphragm. P:   Full vent support F/u CXR, ABG  CARDIOVASCULAR A:  Hypotension. P:  Monitor hemodynamics  RENAL A:   ESRD on HD. P:   Consult nephrology  GASTROINTESTINAL A:   Nutrition. P:   NPO Tube feeds if unable to extubate soon Protonix for SUP  HEMATOLOGIC A:   Anemia of chronic disease and critical illness. P:  F/u CBC SQ heparin for DVT prevention  INFECTIOUS A:   No evidence for infection at this time. P:   Check cultures, procalcitonin Hold Abx for now  ENDOCRINE A:   Hyperglycemia.   P:   SSI  NEUROLOGIC A:   Acute encephalopathy 2nd to respiratory failure. P:   RASS goal: -1 Hold outpt elavil, klonopin, oxycodone   CC time 38 minutes.  Coralyn Helling, MD [facility] Pulmonary/Critical  Care 03/24/2015, 3:16 PM Pager:  [phone] After 3pm call: [phone]

## 2015-03-24 NOTE — ED Notes (Addendum)
Pt. O2 saturation only in the 30's. Resp. Contacted and arrived 1 minute later. Vent. Fixed and oxygen saturation back into upper 90's. Pt. Given bolus of 30mg  at this time with EDP approval.

## 2015-03-24 NOTE — Code Documentation (Signed)
Patient suction by Herbert Seta EMT

## 2015-03-24 NOTE — Procedures (Signed)
Central Venous Catheter Insertion Procedure Note Gary Rivera [id number] 08-19-1960  Procedure: Insertion of Central Venous Catheter Indications: Drug and/or fluid administration  Procedure Details Consent: Risks of procedure as well as the alternatives and risks of each were explained to the (patient/caregiver).  Consent for procedure obtained. Time Out: Verified patient identification, verified procedure, site/side was marked, verified correct patient position, special equipment/implants available, medications/allergies/relevent history reviewed, required imaging and test results available.  Performed  Maximum sterile technique was used including antiseptics, cap, gloves, gown, hand hygiene, mask and sheet. Skin prep: Chlorhexidine; local anesthetic administered A antimicrobial bonded/coated triple lumen catheter was placed in the left internal jugular vein using the Seldinger technique.  Evaluation Blood flow good Complications: No apparent complications Patient did tolerate procedure well. Chest X-ray ordered to verify placement.  CXR: pending.  Lars Masson 03/24/2015, 7:44 PM   Required  Mcarthur Rossetti. Tyson Alias, MD, FACP Pgr: [phone] [facility]

## 2015-03-24 NOTE — Progress Notes (Signed)
eLink Physician-Brief Progress Note Patient Name: SERAPIO GELINAS DOB: 02/16/1961 MRN: [id number]   Date of Service  03/24/2015  HPI/Events of Note  Portable CXR. Post line placement. Line in good position. No pneumothorax.   eICU Interventions  OK to use central line.     Intervention Category Intermediate Interventions: Diagnostic test evaluation  Lawanda Cousins 03/24/2015, 8:19 PM

## 2015-03-24 NOTE — ED Notes (Signed)
 fent. Wasted with Marga Hoots RN. Pt. Unavailable in pyxis.

## 2015-03-24 NOTE — ED Notes (Signed)
Pt is a dialysis patient, last session was Thursday and didn't go today because he didn't have any energy. Has been feeling this way for 1 month. Was hospitalized 1 month ago for bronchitis. Denies pain is a x 4.

## 2015-03-24 NOTE — Code Documentation (Signed)
Gary Rivera. Rn spoke with patients sister Kendal Hymen [phone]. Advised patient is going to be intubated. Patient and sister agreeable.

## 2015-03-24 NOTE — Progress Notes (Signed)
ELINK notified of low SBP. Patient agitated and restless on propofol. Patient placed in restraints. Requested order for restraints and central line for possible pressors.

## 2015-03-24 NOTE — ED Provider Notes (Signed)
CSN: [id number]     Arrival date & time 03/24/15  1211 History   First MD Initiated Contact with Patient 03/24/15 1225     Chief Complaint  Patient presents with  . Fatigue     (Consider location/radiation/quality/duration/timing/severity/associated sxs/prior Treatment) HPI Comments: 55 year old male with history of end-stage renal disease on dialysis Tuesday/Thursday/Saturday last dialyzed Thursday, CHF, pulmonary HTN, hypertension presents for fatigue. The patient reports that his fatigue has been progressive over at least the last month. He reports that he was last dialyzed last Thursday. He said he was on his way to dialysis today but felt so bad that he decided to come to the emergency department. He denies fever. No cough. Denies shortness of breath or chest pain. He denies any diarrhea or vomiting. No abdominal pain. He said he just has not felt well and has been very tired and falling asleep frequently.   Past Medical History  Diagnosis Date  . ESRD on hemodialysis (HCC)     Started dialysis with PD in 2013 and switched to HD in spring 2014 because of fungal peritonitis.  Takes dialysis at home 5 days a week approx 3h 15 min each session.     . CHF (congestive heart failure) (HCC)   . Peritoneal dialysis catheter in place [facility])   . Anemia     patient reporats that last hgb 7.9 a week ago  . Anxiety state, unspecified   . Hypertension    Past Surgical History  Procedure Laterality Date  . Appendectomy    . Peritoneal catheter insertion    . Tee without cardioversion N/A 07/11/2013    Procedure: TRANSESOPHAGEAL ECHOCARDIOGRAM (TEE);  Surgeon: Pricilla Riffle, MD;  Location: [facility] ENDOSCOPY;  Service: Cardiovascular;  Laterality: N/A;  . Cardioversion N/A 07/11/2013    Procedure: CARDIOVERSION;  Surgeon: Pricilla Riffle, MD;  Location: [facility] ENDOSCOPY;  Service: Cardiovascular;  Laterality: N/A;  . Knee arthroscopy Right 05/18/2014    Procedure: ARTHROSCOPIC WASHOUT OF RIGHT KNEE;  Surgeon:  Sheral Apley, MD;  Location: [facility] OR;  Service: Orthopedics;  Laterality: Right;  . Av fistula placement Left 02/18/2015    Procedure: Left Brachial Cephalic ARTERIOVENOUS FISTULA CREATION;  Surgeon: Sherren Kerns, MD;  Location: [facility] OR;  Service: Vascular;  Laterality: Left;  . Insertion of dialysis catheter Right 02/18/2015    Procedure: INSERTION OF DIALYSIS CATHETER Right Internal Jugular;  Surgeon: Sherren Kerns, MD;  Location: [facility] OR;  Service: Vascular;  Laterality: Right;   Family History  Problem Relation Age of Onset  . Diabetes Mother   . Hypertension Mother   . Hypertension Sister   . Asthma Sister    Social History  Substance Use Topics  . Smoking status: Never Smoker   . Smokeless tobacco: Never Used  . Alcohol Use: No    Review of Systems  Constitutional: Positive for fatigue. Negative for fever, chills and diaphoresis.  HENT: Negative for congestion, postnasal drip and rhinorrhea.   Respiratory: Negative for cough, chest tightness and shortness of breath.   Cardiovascular: Negative for chest pain and palpitations.  Gastrointestinal: Negative for nausea, vomiting, abdominal pain and diarrhea.  Genitourinary: Negative for dysuria, urgency and frequency.       Only makes a small amount of urine at baseline  Musculoskeletal: Negative for myalgias and back pain.  Skin: Negative for rash.  Neurological: Negative for dizziness, weakness and headaches.  Hematological: Does not bruise/bleed easily.      Allergies  Renvela  Home Medications  Prior to Admission medications   Medication Sig Start Date End Date Taking? Authorizing Provider  albuterol (PROVENTIL HFA;VENTOLIN HFA) 108 (90 BASE) MCG/ACT inhaler Inhale 1 puff into the lungs every 6 (six) hours as needed for wheezing or shortness of breath.    Historical Provider, MD  amitriptyline (ELAVIL) 25 MG tablet Take 25 mg by mouth at bedtime. Reported on 03/12/2015    Historical Provider, MD  clonazePAM  (KLONOPIN) 0.5 MG tablet Take 1 mg by mouth 2 (two) times daily as needed for anxiety.     Historical Provider, MD  lanthanum (FOSRENOL) 1000 MG chewable tablet Chew 2 tablets (2,000 mg total) by mouth 3 (three) times daily with meals. 02/05/15   Drema [place], MD  metoCLOPramide (REGLAN) 10 MG tablet Take 10 mg by mouth daily. 05/03/14   Historical Provider, MD  multivitamin (RENA-VIT) TABS tablet Take 1 tablet by mouth at bedtime. 05/21/14   [name], DO  Omega-3 1000 MG CAPS Take 1 g by mouth 2 (two) times daily.    Historical Provider, MD  omeprazole (PRILOSEC) 20 MG capsule Take 20 mg by mouth daily. 02/10/15   Historical Provider, MD  Oxycodone HCl 10 MG TABS Take 1 tablet (10 mg total) by mouth 3 (three) times daily as needed. 05/21/14   [name], DO  OXYGEN 2 lpm 24/7 [facility]    Historical Provider, MD  vitamin E 400 UNIT capsule Take 400 Units by mouth daily.    Historical Provider, MD   BP 79/51 mmHg  Pulse 86  Resp 18  SpO2 91% Physical Exam  Constitutional: He is oriented to person, place, and time. He appears lethargic. He appears distressed.  HENT:  Head: Normocephalic and atraumatic.  Right Ear: External ear normal.  Left Ear: External ear normal.  Mouth/Throat: Oropharynx is clear and moist. No oropharyngeal exudate.  Eyes: EOM are normal. Pupils are equal, round, and reactive to light.  Neck: Normal range of motion. Neck supple.  Cardiovascular: Normal rate, regular rhythm, normal heart sounds and intact distal pulses.   No murmur heard. Pulmonary/Chest: Effort normal. Tachypnea noted. No respiratory distress. He has no wheezes. He has no rales.  Abdominal: Soft. He exhibits no distension. There is no tenderness.  Musculoskeletal: He exhibits no edema.  Neurological: He is oriented to person, place, and time. He appears lethargic.  Skin: Skin is warm and dry. No rash noted. He is not diaphoretic.  Vitals reviewed.   ED Course  Procedures (including critical  care time)  INTUBATION Performed by: Larna Daughters  Required items: required blood products, implants, devices, and special equipment available Patient identity confirmed: provided demographic data and hospital-assigned identification number Time out: Immediately prior to procedure a "time out" was called to verify the correct patient, procedure, equipment, support staff and site/side marked as required.  Indications: hypercarbic respiratory failure  Intubation method: Glidescope Laryngoscopy   Preoxygenation: BiPAP/BVM  Sedatives: Etomidate Paralytic: Succinylcholine  Tube Size: 7.5 cuffed  Post-procedure assessment: chest rise and ETCO2 monitor Breath sounds: equal and absent over the epigastrium Tube secured with: ETT holder Chest x-ray interpreted by radiologist and me.  Chest x-ray findings: endotracheal tube in appropriate position  Patient tolerated the procedure well with no immediate complications.  CRITICAL CARE Performed by: Larna Daughters   Total critical care time: 60 minutes  Critical care time was exclusive of separately billable procedures and treating other patients.  Critical care was necessary to treat or prevent imminent or life-threatening deterioration.  Critical care was time spent personally by me on the following activities: development of treatment plan with patient and/or surrogate as well as nursing, discussions with consultants, evaluation of patient's response to treatment, examination of patient, obtaining history from patient or surrogate, ordering and performing treatments and interventions, ordering and review of laboratory studies, ordering and review of radiographic studies, pulse oximetry and re-evaluation of patient's condition.  Labs Review Labs Reviewed  CBC - Abnormal; Notable for the following:    RBC 3.62 (*)    Hemoglobin 10.1 (*)    HCT 35.8 (*)    MCHC 28.2 (*)    RDW 16.4 (*)    All other components within normal limits   I-STAT CHEM 8, ED - Abnormal; Notable for the following:    BUN 70 (*)    Creatinine, Ser 16.90 (*)    Glucose, Bld 149 (*)    Calcium, Ion 0.98 (*)    Hemoglobin 12.6 (*)    HCT 37.0 (*)    All other components within normal limits  CBG MONITORING, ED - Abnormal; Notable for the following:    Glucose-Capillary 144 (*)    All other components within normal limits  CULTURE, BLOOD (ROUTINE X 2)  CULTURE, BLOOD (ROUTINE X 2)  BASIC METABOLIC PANEL  URINALYSIS, ROUTINE W REFLEX MICROSCOPIC (NOT AT [facility])  AMMONIA  I-STAT CG4 LACTIC ACID, ED  I-STAT TROPOININ, ED    Imaging Review No results found. I have personally reviewed and evaluated these images and lab results as part of my medical decision-making.   EKG Interpretation None      MDM  Patient was seen and evaluated immediately upon side. Patient was hypoxic on arrival to the ER but was supposed to be using 4 L of nasal cannula oxygen which she was not using. His oxygen saturation was intermittently normal on nonrebreather.  He was started on BiPAP. Initial laboratory studies revealed an elevated creatinine consistent with the patient's renal failure. Potassium was normal. His ABG demonstrated a PCO2 of greater than 100 with this finding and the patient's lethargy and the decision was made to intubate the patient. He expressed understanding and agreement with this plan. The patient was intubated as detailed above. Critical care was consulted. He was admitted under their care for continued treatment and management. Final diagnoses:  None    1. Acute respiratory failure with hypercapnia  2. Fatigue    Leta Baptist, MD 03/24/15 1719

## 2015-03-24 NOTE — Code Documentation (Signed)
Easy cap color change noted. Breathe sounds heard with MD. Chest rise and fall. 7 1/2  ET tube 23 @ lip. Secured with strap. OG placed.

## 2015-03-24 NOTE — Progress Notes (Signed)
Bladder scan performed. Only 25 mls on scanner. I/O cath performed with no urine. Will monitor.

## 2015-03-24 NOTE — Consult Note (Signed)
Reason for Consult: Hypercarbic respiratory failure/volume overload in a patient with ESRD Referring Physician: Coralyn Helling M.D. (CCM)  HPI:  55 year old African-American man with past medical history significant for hypertension, congestive heart failure and end-stage renal disease previously on peritoneal dialysis, then home hemodialysis and recently in-center hemodialysis at [facility]. Comes in to the emergency room after getting acutely short of breath and feeling poorly after missing dialysis on Saturday (last treatment was on Thursday last week-4 days ago). In the emergency room found to be in hypercarbic respiratory failure and intubated to protect airway. Was also transiently hypotensive for which he got intravenous fluid bolus.  Dialysis prescription: Tuesday, Thursday, Saturday at [facility]. 4 hours 30 minutes, 180 dialyzer, blood flow rate 400, dialysate flow rate 800, estimated dry weight 138kg, 2K/3.5 calcium dialysate, no UF profile of sodium modeling. Right IJ tunneled hemodialysis catheter. Hectorol 2 g IV 3 times a week. Heparin 4200 unit bolus and Mircera 75 g  IV every 2 weeks.  Past Medical History  Diagnosis Date  . ESRD on hemodialysis (HCC)     Started dialysis with PD in 2013 and switched to HD in spring 2014 because of fungal peritonitis.  Takes dialysis at home 5 days a week approx 3h 15 min each session.     . CHF (congestive heart failure) (HCC)   . Peritoneal dialysis catheter in place [facility])   . Anemia     patient reporats that last hgb 7.9 a week ago  . Anxiety state, unspecified   . Hypertension     Past Surgical History  Procedure Laterality Date  . Appendectomy    . Peritoneal catheter insertion    . Tee without cardioversion N/A 07/11/2013    Procedure: TRANSESOPHAGEAL ECHOCARDIOGRAM (TEE);  Surgeon: Pricilla Riffle, MD;  Location: [facility] ENDOSCOPY;  Service: Cardiovascular;  Laterality: N/A;  . Cardioversion N/A 07/11/2013     Procedure: CARDIOVERSION;  Surgeon: Pricilla Riffle, MD;  Location: [facility] ENDOSCOPY;  Service: Cardiovascular;  Laterality: N/A;  . Knee arthroscopy Right 05/18/2014    Procedure: ARTHROSCOPIC WASHOUT OF RIGHT KNEE;  Surgeon: Sheral Apley, MD;  Location: [facility] OR;  Service: Orthopedics;  Laterality: Right;  . Av fistula placement Left 02/18/2015    Procedure: Left Brachial Cephalic ARTERIOVENOUS FISTULA CREATION;  Surgeon: Sherren Kerns, MD;  Location: [facility] OR;  Service: Vascular;  Laterality: Left;  . Insertion of dialysis catheter Right 02/18/2015    Procedure: INSERTION OF DIALYSIS CATHETER Right Internal Jugular;  Surgeon: Sherren Kerns, MD;  Location: [facility] OR;  Service: Vascular;  Laterality: Right;    Family History  Problem Relation Age of Onset  . Diabetes Mother   . Hypertension Mother   . Hypertension Sister   . Asthma Sister     Social History:  reports that he has never smoked. He has never used smokeless tobacco. He reports that he does not drink alcohol or use illicit drugs.  Allergies:  Allergies  Allergen Reactions  . Renvela [Sevelamer] Nausea And Vomiting    Medications:  Scheduled: . antiseptic oral rinse  7 mL Mouth Rinse 10 times per day  . chlorhexidine gluconate  15 mL Mouth Rinse BID  . heparin subcutaneous  5,000 Units Subcutaneous 3 times per day  . insulin aspart  0-9 Units Subcutaneous 6 times per day  . pantoprazole (PROTONIX) IV  40 mg Intravenous Q24H    BMP Latest Ref Rng 03/24/2015 03/24/2015 02/26/2015  Glucose 65 - 99  mg/dL 644(I) 347(Q) 259(D)  BUN 6 - 20 mg/dL 63(O) 75(I) 43(P)  Creatinine 0.61 - 1.24 mg/dL 29.51(O) 84.16(S) 06.30(Z)  Sodium 135 - 145 mmol/L 142 143 141  Potassium 3.5 - 5.1 mmol/L 4.1 4.1 4.2  Chloride 101 - 111 mmol/L 102 101 101  CO2 22 - 32 mmol/L - 26 28  Calcium 8.9 - 10.3 mg/dL - 7.9(L) 7.6(L)   CBC Latest Ref Rng 03/24/2015 03/24/2015 02/26/2015  WBC 4.0 - 10.5 K/uL - 6.6 5.8  Hemoglobin 13.0 - 17.0 g/dL 12.6(L) 10.1(L)  9.6(L)  Hematocrit 39.0 - 52.0 % 37.0(L) 35.8(L) 32.4(L)  Platelets 150 - 400 K/uL - 200 141(L)     Dg Chest Portable 1 View  03/24/2015  CLINICAL DATA:  Patient unresponsive.  Severe respiratory distress. EXAM: PORTABLE CHEST 1 VIEW COMPARISON:  Portable film earlier in the day. FINDINGS: ET tube has been inserted, and lies 3.8 cm above carina. Dual lumen catheter from RIGHT IJ approach unchanged, with tips in the RIGHT atrium. Cardiomegaly. Worsening pulmonary edema. Low lung volumes. Enteric tube placed in the stomach. IMPRESSION: ET tube good position, 3.8 cm above carina. ET tube good position 3.8 cm above carina. Worsening aeration, now with pulmonary edema. Electronically Signed   By: Elsie Stain M.D.   On: 03/24/2015 15:08   Dg Chest Portable 1 View  03/24/2015  CLINICAL DATA:  Shortness of Breath.  Chronic renal failure. EXAM: PORTABLE CHEST 1 VIEW COMPARISON:  February 23, 2015 FINDINGS: There is persistent elevation of the right hemidiaphragm. There is no edema or consolidation. There is mild scarring in the left base. There is mild cardiomegaly with pulmonary vascularity within normal limits. Dual-lumen catheter with tips in superior vena cava. No pneumothorax. IMPRESSION: Central catheter as described without pneumothorax. No edema or consolidation. Slight scarring left base. Stable elevation right hemidiaphragm. Stable cardiac prominence. Electronically Signed   By: Bretta Bang III M.D.   On: 03/24/2015 13:20    Review of Systems  Unable to perform ROS: intubated   Blood pressure 102/65, pulse 73, resp. rate 18, SpO2 100 %. Physical Exam  Nursing note and vitals reviewed. Constitutional: He appears well-developed and well-nourished. He appears distressed.  uncomfortable from just being intubated  HENT:  Head: Normocephalic and atraumatic.  Nose: Nose normal.  intubated  Eyes: Conjunctivae are normal. Pupils are equal, round, and reactive to light. No scleral icterus.   Neck: JVD present.  12cm JVP  Cardiovascular: Normal rate, regular rhythm and normal heart sounds.  Exam reveals no friction rub.   No murmur heard. Respiratory:  Coarse and transmitted breath sounds bilaterally  GI: Soft. Bowel sounds are normal. There is no tenderness. There is no rebound.  obese  Musculoskeletal: He exhibits no edema.  Skin: Skin is warm and dry. No rash noted. No erythema.    Assessment/Plan: 1. Hypercarbic respiratory failure: Currently intubated and on ventilator support/management of bronchospasm per critical care. Anticipate will be assisted by ultrafiltration with hemodialysis as well. 2. End-stage renal disease: Hemodialysis will be done urgently for ultrafiltration-unfortunately missed his last treatment on Saturday. 3. Anemia: Hemoglobin levels currently acceptable-monitor off ESA. 4. CKD-MBD:  Monitor calcium/phosphorus and resume phosphorus binders when able to eat/and vitamin D receptor analog intravenously with dialysis 5. Hypertension/volume: Not on antihypertensive therapy as an outpatient-initially was hypotensive for which he got fluid bolus and we'll attempt dialysis judiciously with ultrafiltration.  Adriona Kaney K. 03/24/2015, 3:54 PM

## 2015-03-25 ENCOUNTER — Inpatient Hospital Stay (HOSPITAL_COMMUNITY): Payer: Medicare Other

## 2015-03-25 DIAGNOSIS — Z452 Encounter for adjustment and management of vascular access device: Secondary | ICD-10-CM | POA: Diagnosis present

## 2015-03-25 LAB — POCT I-STAT 3, ART BLOOD GAS (G3+)
Acid-Base Excess: 5 mmol/L — ABNORMAL HIGH (ref 0.0–2.0)
Bicarbonate: 25.6 mEq/L — ABNORMAL HIGH (ref 20.0–24.0)
O2 Saturation: 96 %
PH ART: 7.617 — AB (ref 7.350–7.450)
TCO2: 26 mmol/L (ref 0–100)
pCO2 arterial: 25.1 mmHg — ABNORMAL LOW (ref 35.0–45.0)
pO2, Arterial: 64 mmHg — ABNORMAL LOW (ref 80.0–100.0)

## 2015-03-25 LAB — CBC
HCT: 34 % — ABNORMAL LOW (ref 39.0–52.0)
HEMOGLOBIN: 10.3 g/dL — AB (ref 13.0–17.0)
MCH: 28.5 pg (ref 26.0–34.0)
MCHC: 30.3 g/dL (ref 30.0–36.0)
MCV: 93.9 fL (ref 78.0–100.0)
Platelets: 216 10*3/uL (ref 150–400)
RBC: 3.62 MIL/uL — AB (ref 4.22–5.81)
RDW: 16.4 % — ABNORMAL HIGH (ref 11.5–15.5)
WBC: 8.3 10*3/uL (ref 4.0–10.5)

## 2015-03-25 LAB — POCT I-STAT 3, VENOUS BLOOD GAS (G3P V)
ACID-BASE EXCESS: 1 mmol/L (ref 0.0–2.0)
BICARBONATE: 23.8 meq/L (ref 20.0–24.0)
O2 SAT: 60 %
TCO2: 25 mmol/L (ref 0–100)
pCO2, Ven: 29.9 mmHg — ABNORMAL LOW (ref 45.0–50.0)
pH, Ven: 7.509 — ABNORMAL HIGH (ref 7.250–7.300)
pO2, Ven: 27 mmHg — CL (ref 30.0–45.0)

## 2015-03-25 LAB — RENAL FUNCTION PANEL
ALBUMIN: 3.4 g/dL — AB (ref 3.5–5.0)
ANION GAP: 18 — AB (ref 5–15)
BUN: 28 mg/dL — ABNORMAL HIGH (ref 6–20)
CALCIUM: 9 mg/dL (ref 8.9–10.3)
CO2: 24 mmol/L (ref 22–32)
Chloride: 97 mmol/L — ABNORMAL LOW (ref 101–111)
Creatinine, Ser: 8.89 mg/dL — ABNORMAL HIGH (ref 0.61–1.24)
GFR calc non Af Amer: 6 mL/min — ABNORMAL LOW (ref >60)
GFR, EST AFRICAN AMERICAN: 7 mL/min — AB (ref >60)
Glucose, Bld: 93 mg/dL (ref 65–99)
PHOSPHORUS: 2.2 mg/dL — AB (ref 2.5–4.6)
Potassium: 2.7 mmol/L — CL (ref 3.5–5.1)
SODIUM: 139 mmol/L (ref 135–145)

## 2015-03-25 LAB — GLUCOSE, CAPILLARY
GLUCOSE-CAPILLARY: 66 mg/dL (ref 65–99)
GLUCOSE-CAPILLARY: 71 mg/dL (ref 65–99)
GLUCOSE-CAPILLARY: 77 mg/dL (ref 65–99)
GLUCOSE-CAPILLARY: 87 mg/dL (ref 65–99)
Glucose-Capillary: 70 mg/dL (ref 65–99)

## 2015-03-25 MED ORDER — HEPARIN SODIUM (PORCINE) 1000 UNIT/ML DIALYSIS
40.0000 [IU]/kg | INTRAMUSCULAR | Status: DC | PRN
  Filled 2015-03-25: qty 6

## 2015-03-25 MED ORDER — ALBUMIN HUMAN 25 % IV SOLN
25.0000 g | Freq: Once | INTRAVENOUS | Status: AC
  Administered 2015-03-25: 25 g via INTRAVENOUS

## 2015-03-25 MED ORDER — SODIUM CHLORIDE 0.9 % IV SOLN
125.0000 mg | INTRAVENOUS | Status: DC
  Administered 2015-03-26: 125 mg via INTRAVENOUS
  Filled 2015-03-25 (×2): qty 10

## 2015-03-25 MED ORDER — ACETAMINOPHEN 325 MG PO TABS
650.0000 mg | ORAL_TABLET | Freq: Four times a day (QID) | ORAL | Status: DC | PRN
  Administered 2015-03-25 – 2015-03-26 (×2): 650 mg via ORAL
  Filled 2015-03-25 (×4): qty 2

## 2015-03-25 MED ORDER — ALBUTEROL SULFATE (2.5 MG/3ML) 0.083% IN NEBU
2.5000 mg | INHALATION_SOLUTION | Freq: Four times a day (QID) | RESPIRATORY_TRACT | Status: DC | PRN
  Administered 2015-03-26: 2.5 mg via RESPIRATORY_TRACT
  Filled 2015-03-25: qty 3

## 2015-03-25 MED ORDER — POTASSIUM CHLORIDE 10 MEQ/50ML IV SOLN
10.0000 meq | INTRAVENOUS | Status: AC
  Administered 2015-03-25 (×4): 10 meq via INTRAVENOUS
  Filled 2015-03-25 (×4): qty 50

## 2015-03-25 MED ORDER — ALBUMIN HUMAN 25 % IV SOLN
INTRAVENOUS | Status: AC
  Filled 2015-03-25: qty 100

## 2015-03-25 MED ORDER — NA FERRIC GLUC CPLX IN SUCROSE 12.5 MG/ML IV SOLN: 62.5000 mg | INTRAVENOUS | Status: DC

## 2015-03-25 NOTE — Procedures (Signed)
Extubation Procedure Note  Patient Details:   Name: MIKOLAJ BRIDEAU DOB: 1960-06-30 MRN: [id number]   Airway Documentation:     Evaluation  O2 sats: stable throughout Complications: No apparent complications Patient did tolerate procedure well. Bilateral Breath Sounds: Rhonchi, Diminished Suctioning: Oral, Airway Yes   Patient extubated to 5L nasal cannula per MD order.  Positive cuff leak noted.  No evidence of stridor.  Patient able to speak post extubation.  Sats currently 94%.  Vitals are stable.  No apparent complications.   Ledell Noss N 03/25/2015, 11:27 AM

## 2015-03-25 NOTE — Progress Notes (Signed)
Utilization Review Completed.Allisen Pidgeon T1/06/2015  

## 2015-03-25 NOTE — Procedures (Signed)
Patient seen on Hemodialysis. QB 400, UF goal 1.5L Treatment adjusted as needed.  Zetta Bills MD [facility] Office # [phone] Pager # [phone] 3:36 PM

## 2015-03-25 NOTE — Progress Notes (Signed)
[facility]  Patient Status: Active (receiving services up to time of hospitalization)  [facility] is providing the following services: PT   If patient discharges after hours, please call [phone].   [name] 03/25/2015, 11:03 AM

## 2015-03-25 NOTE — Progress Notes (Signed)
PULMONARY / CRITICAL CARE MEDICINE   Name: Gary Rivera MRN: [id number] DOB: Jan 21, 1961    ADMISSION DATE:  03/24/2015 CONSULTATION DATE:  03/24/15  REFERRING MD:  ED  CHIEF COMPLAINT:  Can't breathe  HISTORY OF PRESENT ILLNESS:   Gary Rivera is a 55 y.o. male w/ PMHx of ESRD on HD, CHF, anemia, and HTN, presents to the ED w/ complaints of SOB after missing HD. In the ED, patient had significant hypoxia, ABG with respiratory acidosis, required intubation. PCCM asked to admit.   SUBJECTIVE:  Required low dose Levophed overnight. No acute issues.   VITAL SIGNS: BP 106/61 mmHg  Pulse 75  Temp(Src) 98.7 F (37.1 C) (Oral)  Resp 30  Wt 302 lb 11.1 oz (137.3 kg)  SpO2 98%  HEMODYNAMICS:    VENTILATOR SETTINGS: Vent Mode:  [-] PRVC FiO2 (%):  [50 %-100 %] 50 % Set Rate:  [18 bmp-28 bmp] 18 bmp Vt Set:  [640 mL] 640 mL PEEP:  [5 cmH20] 5 cmH20 Plateau Pressure:  [12 cmH20-25 cmH20] 12 cmH20  INTAKE / OUTPUT: I/O last 3 completed shifts: In: 781.5 [I.V.:731.5; IV Piggyback:50] Out: 2600 [Other:2600]  PHYSICAL EXAMINATION: General:  Sedated, on ventilator. NAD.  Neuro:  RASS -1. Moves all extremities, follows commands.  HEENT:  ETT in place. Moist mucus membranes. PERRL.  Cardiovascular:  RRR, no murmurs, gallops, or rubs.  Lungs:  Air entry equal bilaterally, Scattered rhonchi.  Abdomen:  Soft, non-tender, non-distended. BS + Musculoskeletal:  Trace edema.  Skin:  Dry, otherwise no rash.   LABS:  BMET  Recent Labs Lab 03/24/15 1200 03/24/15 1247 03/25/15 0500  NA 143 142 139  K 4.1 4.1 2.7*  CL 101 102 97*  CO2 26  --  24  BUN 69* 70* 28*  CREATININE 18.23* 16.90* 8.89*  GLUCOSE 158* 149* 93    Electrolytes  Recent Labs Lab 03/24/15 1200 03/25/15 0500  CALCIUM 7.9* 9.0  PHOS  --  2.2*    CBC  Recent Labs Lab 03/24/15 1200 03/24/15 1247 03/25/15 0500  WBC 6.6  --  8.3  HGB 10.1* 12.6* 10.3*  HCT 35.8* 37.0* 34.0*  PLT 200  --   216    Sepsis Markers  Recent Labs Lab 03/24/15 1253 03/24/15 2040  LATICACIDVEN 1.61  --   PROCALCITON  --  1.59    ABG  Recent Labs Lab 03/25/15 0339  PHART 7.617*  PCO2ART 25.1*  PO2ART 64.0*    Liver Enzymes  Recent Labs Lab 03/25/15 0500  ALBUMIN 3.4*    Glucose  Recent Labs Lab 03/24/15 1659 03/24/15 1739 03/24/15 1927 03/24/15 2343 03/25/15 0355 03/25/15 0802  GLUCAP 57* 108* 68 66 87 71    Imaging Dg Chest Port 1 View  03/25/2015  CLINICAL DATA:  Acute respiratory failure. EXAM: PORTABLE CHEST 1 VIEW COMPARISON:  03/24/2015. FINDINGS: ET tube 4.5 cm above carina. Unchanged central venous line, double lumen dialysis catheter, and enteric tube. Cardiomegaly. Decreased lung volumes with moderate edema but has worsened from yesterday's examination. No pneumothorax. IMPRESSION: Worsening aeration. Decreased lung volumes with moderate edema. Tubes and lines are stable. Electronically Signed   By: Elsie Stain M.D.   On: 03/25/2015 07:34   Dg Chest Port 1 View  03/24/2015  CLINICAL DATA:  Central line placement EXAM: PORTABLE CHEST 1 VIEW COMPARISON:  03/24/2015 FINDINGS: A left jugular central venous catheter has been placed. The tip is in the upper SVC. No ensuing pneumothorax. Endotracheal tube, NG tube,  and right jugular dialysis catheter are stable. Cardiomegaly is unchanged. Low lung volumes have improved. No consolidation. IMPRESSION: New left jugular central venous catheter with its tip in the upper SVC and no pneumothorax. Improved lung volumes. Electronically Signed   By: Jolaine Click M.D.   On: 03/24/2015 19:53   Dg Chest Portable 1 View  03/24/2015  CLINICAL DATA:  Patient unresponsive.  Severe respiratory distress. EXAM: PORTABLE CHEST 1 VIEW COMPARISON:  Portable film earlier in the day. FINDINGS: ET tube has been inserted, and lies 3.8 cm above carina. Dual lumen catheter from RIGHT IJ approach unchanged, with tips in the RIGHT atrium. Cardiomegaly.  Worsening pulmonary edema. Low lung volumes. Enteric tube placed in the stomach. IMPRESSION: ET tube good position, 3.8 cm above carina. ET tube good position 3.8 cm above carina. Worsening aeration, now with pulmonary edema. Electronically Signed   By: Elsie Stain M.D.   On: 03/24/2015 15:08   Dg Chest Portable 1 View  03/24/2015  CLINICAL DATA:  Shortness of Breath.  Chronic renal failure. EXAM: PORTABLE CHEST 1 VIEW COMPARISON:  February 23, 2015 FINDINGS: There is persistent elevation of the right hemidiaphragm. There is no edema or consolidation. There is mild scarring in the left base. There is mild cardiomegaly with pulmonary vascularity within normal limits. Dual-lumen catheter with tips in superior vena cava. No pneumothorax. IMPRESSION: Central catheter as described without pneumothorax. No edema or consolidation. Slight scarring left base. Stable elevation right hemidiaphragm. Stable cardiac prominence. Electronically Signed   By: Bretta Bang III M.D.   On: 03/24/2015 13:20     STUDIES:  CULTURES:  Blood 03/24/15 >>   SIGNIFICANT EVENTS: 03/24/15 >> Admit, intubated  LINES/TUBES: ETT 03/24/15 >> [facility]J 03/24/15 >>   ASSESSMENT / PLAN:  PULMONARY A: Acute on chronic hypoxic/hypercapnic respiratory failure. Possible sleep disordered breathing. Respiratory alkalosis P:  Weaning this AM CXR in AM HD per renal abg noted reduce MV if back to vent Agree further hd  CARDIOVASCULAR A:  Hypotension P:  Levophed for MAP < 55 with good MS Trop neg  RENAL A:  ESRD on HD Hypokalemia P:  HD per renal K supplemented Would want repeat hd  GASTROINTESTINAL A:  Nutrition P:  NPO Start tube feeds if unable to extubate soon Protonix for SUP  HEMATOLOGIC A:  Anemia of chronic disease and critical illness P:  F/u CBC in AM SQ heparin for DVT prevention  INFECTIOUS A:  No evidence for infection at this time Procalcitonin 1.59 in setting ESRD P:   Follow up cultures, procalcitonin dc  Hold Abx for now, low threshold to start  ENDOCRINE A:  Hyperglycemia P:  SSI q4h  NEUROLOGIC A:  Acute encephalopathy 2nd to respiratory failure P:  RASS goal: -1 Hold outpt elavil, klonopin, oxycodone wua  FAMILY  - Updates: Sister updated 03/23/14    Lauris Chroman, MD PGY-3, Internal Medicine Pager: [phone]   03/25/2015, 8:43 AM   STAFF NOTE: Cindi Carbon, MD FACP have personally reviewed patient's available data, including medical history, events of note, physical examination and test results as part of my evaluation. I have discussed with resident/NP and other care providers such as pharmacist, RN and RRT. In addition, I personally evaluated patient and elicited key findings of: no evidence ischemia, trop neg,. pcxr improved but could tolerate further neg balance, abg noted, if back to rest would reduce MV , wean cpap 5 ps 5 goal 30 min , we need to redcue PS to 5 for  look at extubation, neg balance again  In 24 hrs, WUA The patient is critically ill with multiple organ systems failure and requires high complexity decision making for assessment and support, frequent evaluation and titration of therapies, application of advanced monitoring technologies and extensive interpretation of multiple databases.   Critical Care Time devoted to patient care services described in this note is30  Minutes. This time reflects time of care of this signee: Rory Percy, MD FACP. This critical care time does not reflect procedure time, or teaching time or supervisory time of PA/NP/Med student/Med Resident etc but could involve care discussion time. Rest per NP/medical resident whose note is outlined above and that I agree with   Mcarthur Rossetti. Tyson Alias, MD, FACP Pgr: [phone] [facility] 03/25/2015 10:51 AM

## 2015-03-25 NOTE — Progress Notes (Signed)
Patient ID: Gary Rivera, male   DOB: 1960/10/10, 55 y.o.   MRN: [id number]  [facility] Progress Note   Assessment/ Plan:    1. Hypercarbic respiratory failure: intubated and on ventilator support with ongoing management of bronchospasm per CCM. Chest x-ray shows worsening airspace disease-? ALI/ARDS 2. End-stage renal disease: Hemodialysis done yesterday for ultrafiltration/volume unloading and has been reordered again for today for more volume removal to see if this improves respiratory status. Will exercise caution with pressor requirements. I have adjusted dialysate bath to help manage his hypokalemia and earlier approved OGT replacement of 40 mEq potassium. 3. Anemia: Hemoglobin levels currently acceptable-monitor off ESA. 4. CKD-MBD: Currently nothing by mouth while intubated-low phosphorus levels, not on binders 5. Hypertension/volume: Blood pressures fair on low-dose norepinephrine drip. Subjective:   On low-dose pressors overnight-no acute events and tolerated dialysis with 2.3 L ultrafiltration.    Objective:   BP 102/65 mmHg  Pulse 72  Temp(Src) 98.7 F (37.1 C) (Oral)  Resp 24  Wt 137.3 kg (302 lb 11.1 oz)  SpO2 97%  Physical Exam: Gen: Intubated, sedated CVS: Pulse regular in rate and rhythm, S1 and S2 normal Resp: Coarse breath sounds bilaterally, no rales Abd: Soft, obese, and nontender Ext: No lower extremity edema  Labs: BMET  Recent Labs Lab 03/24/15 1200 03/24/15 1247 03/25/15 0500  NA 143 142 139  K 4.1 4.1 2.7*  CL 101 102 97*  CO2 26  --  24  GLUCOSE 158* 149* 93  BUN 69* 70* 28*  CREATININE 18.23* 16.90* 8.89*  CALCIUM 7.9*  --  9.0  PHOS  --   --  2.2*   CBC  Recent Labs Lab 03/24/15 1200 03/24/15 1247 03/25/15 0500  WBC 6.6  --  8.3  HGB 10.1* 12.6* 10.3*  HCT 35.8* 37.0* 34.0*  MCV 98.9  --  93.9  PLT 200  --  216   Medications:    . antiseptic oral rinse  7 mL Mouth Rinse 10 times per day  . chlorhexidine  gluconate  15 mL Mouth Rinse BID  . heparin subcutaneous  5,000 Units Subcutaneous 3 times per day  . insulin aspart  0-9 Units Subcutaneous 6 times per day  . pantoprazole (PROTONIX) IV  40 mg Intravenous Q24H  . potassium chloride  10 mEq Intravenous Q1 Hr x 4   Zetta Bills, MD 03/25/2015, 8:24 AM

## 2015-03-25 NOTE — Progress Notes (Addendum)
eLink Physician-Brief Progress Note Patient Name: Gary Rivera DOB: 1960-11-13 MRN: [id number]   Date of Service  03/25/2015  HPI/Events of Note  RN notified of labile BP & tingling/pain in his fingers. Patient requesting neb tx, tylenol & klonopin.  eICU Interventions  1. Tylenol prn 2. Continue to hold Klonopin for now 3. BP labile at baseline & patient denies chest pain with normal mentation per RN 4. Albuterol via neb prn     Intervention Category Major Interventions: Hypotension - evaluation and management  Lawanda Cousins 03/25/2015, 10:24 PM

## 2015-03-25 NOTE — Progress Notes (Signed)
Pt currently still in dialysis. Report called to RN on 6E.

## 2015-03-26 ENCOUNTER — Inpatient Hospital Stay (HOSPITAL_COMMUNITY): Payer: Medicare Other

## 2015-03-26 DIAGNOSIS — J9621 Acute and chronic respiratory failure with hypoxia: Principal | ICD-10-CM

## 2015-03-26 DIAGNOSIS — I272 Other secondary pulmonary hypertension: Secondary | ICD-10-CM

## 2015-03-26 DIAGNOSIS — N186 End stage renal disease: Secondary | ICD-10-CM

## 2015-03-26 DIAGNOSIS — I10 Essential (primary) hypertension: Secondary | ICD-10-CM

## 2015-03-26 DIAGNOSIS — I340 Nonrheumatic mitral (valve) insufficiency: Secondary | ICD-10-CM

## 2015-03-26 DIAGNOSIS — R0600 Dyspnea, unspecified: Secondary | ICD-10-CM

## 2015-03-26 DIAGNOSIS — E662 Morbid (severe) obesity with alveolar hypoventilation: Secondary | ICD-10-CM

## 2015-03-26 DIAGNOSIS — J9622 Acute and chronic respiratory failure with hypercapnia: Secondary | ICD-10-CM

## 2015-03-26 DIAGNOSIS — I5022 Chronic systolic (congestive) heart failure: Secondary | ICD-10-CM

## 2015-03-26 LAB — BASIC METABOLIC PANEL
ANION GAP: 14 (ref 5–15)
BUN: 26 mg/dL — AB (ref 6–20)
CALCIUM: 8.5 mg/dL — AB (ref 8.9–10.3)
CO2: 28 mmol/L (ref 22–32)
Chloride: 98 mmol/L — ABNORMAL LOW (ref 101–111)
Creatinine, Ser: 9 mg/dL — ABNORMAL HIGH (ref 0.61–1.24)
GFR calc Af Amer: 7 mL/min — ABNORMAL LOW (ref >60)
GFR, EST NON AFRICAN AMERICAN: 6 mL/min — AB (ref >60)
GLUCOSE: 83 mg/dL (ref 65–99)
POTASSIUM: 4 mmol/L (ref 3.5–5.1)
SODIUM: 140 mmol/L (ref 135–145)

## 2015-03-26 LAB — CBC
HCT: 33.9 % — ABNORMAL LOW (ref 39.0–52.0)
Hemoglobin: 9.7 g/dL — ABNORMAL LOW (ref 13.0–17.0)
MCH: 28.7 pg (ref 26.0–34.0)
MCHC: 28.6 g/dL — ABNORMAL LOW (ref 30.0–36.0)
MCV: 100.3 fL — ABNORMAL HIGH (ref 78.0–100.0)
PLATELETS: 155 10*3/uL (ref 150–400)
RBC: 3.38 MIL/uL — AB (ref 4.22–5.81)
RDW: 16.8 % — AB (ref 11.5–15.5)
WBC: 6.1 10*3/uL (ref 4.0–10.5)

## 2015-03-26 LAB — GLUCOSE, CAPILLARY
GLUCOSE-CAPILLARY: 116 mg/dL — AB (ref 65–99)
GLUCOSE-CAPILLARY: 120 mg/dL — AB (ref 65–99)
Glucose-Capillary: 82 mg/dL (ref 65–99)
Glucose-Capillary: 82 mg/dL (ref 65–99)
Glucose-Capillary: 127 mg/dL — ABNORMAL HIGH (ref 65–99)

## 2015-03-26 LAB — HEPATITIS B CORE ANTIBODY, TOTAL: Hep B Core Total Ab: NEGATIVE

## 2015-03-26 LAB — HEPATITIS B SURFACE ANTIGEN: HEP B S AG: NEGATIVE

## 2015-03-26 LAB — HEPATITIS B SURFACE ANTIBODY,QUALITATIVE: HEP B S AB: REACTIVE

## 2015-03-26 MED ORDER — RENA-VITE PO TABS
1.0000 | ORAL_TABLET | Freq: Every day | ORAL | Status: DC
  Administered 2015-03-26: 1 via ORAL
  Filled 2015-03-26: qty 1

## 2015-03-26 MED ORDER — TRAMADOL HCL 50 MG PO TABS
50.0000 mg | ORAL_TABLET | Freq: Four times a day (QID) | ORAL | Status: DC | PRN
  Administered 2015-03-26: 50 mg via ORAL
  Filled 2015-03-26: qty 1

## 2015-03-26 MED ORDER — AMITRIPTYLINE HCL 25 MG PO TABS
25.0000 mg | ORAL_TABLET | Freq: Every day | ORAL | Status: DC
  Administered 2015-03-26: 25 mg via ORAL
  Filled 2015-03-26: qty 1

## 2015-03-26 MED ORDER — MIDODRINE HCL 5 MG PO TABS
10.0000 mg | ORAL_TABLET | Freq: Three times a day (TID) | ORAL | Status: DC
  Administered 2015-03-26 – 2015-03-27 (×3): 10 mg via ORAL
  Filled 2015-03-26 (×3): qty 2

## 2015-03-26 MED ORDER — MIDODRINE HCL 5 MG PO TABS: 10.0000 mg | ORAL_TABLET | ORAL | Status: DC | PRN

## 2015-03-26 MED ORDER — PANTOPRAZOLE SODIUM 40 MG PO TBEC
40.0000 mg | DELAYED_RELEASE_TABLET | Freq: Every day | ORAL | Status: DC
  Administered 2015-03-27: 40 mg via ORAL
  Filled 2015-03-26: qty 1

## 2015-03-26 MED ORDER — CLONAZEPAM 0.5 MG PO TABS: 0.2500 mg | ORAL_TABLET | Freq: Two times a day (BID) | ORAL | Status: DC | PRN

## 2015-03-26 MED ORDER — METOCLOPRAMIDE HCL 10 MG PO TABS
10.0000 mg | ORAL_TABLET | Freq: Every day | ORAL | Status: DC
  Administered 2015-03-27: 10 mg via ORAL
  Filled 2015-03-26: qty 1

## 2015-03-26 MED ORDER — LANTHANUM CARBONATE 500 MG PO CHEW
2000.0000 mg | CHEWABLE_TABLET | Freq: Three times a day (TID) | ORAL | Status: DC
  Administered 2015-03-26 – 2015-03-27 (×3): 2000 mg via ORAL
  Filled 2015-03-26 (×3): qty 4

## 2015-03-26 MED ORDER — MIDODRINE HCL 5 MG PO TABS
10.0000 mg | ORAL_TABLET | Freq: Two times a day (BID) | ORAL | Status: DC
  Administered 2015-03-26: 10 mg via ORAL
  Filled 2015-03-26: qty 2

## 2015-03-26 NOTE — Procedures (Signed)
Patient seen on Hemodialysis. QB 400, UF goal 0.5L(keeping even) Treatment adjusted as needed.  Zetta Bills MD [facility]. Office # [phone] Pager # [phone] 1:05 PM

## 2015-03-26 NOTE — Progress Notes (Signed)
Subjective:  Awoken from sleep no co , tolerated HD yest. ,no reported sob  Objective Vital signs in last 24 hours: Filed Vitals:   03/25/15 2204 03/25/15 2205 03/26/15 0012 03/26/15 0500  BP: 90/37 100/54  90/48  Pulse: 89   81  Temp: 98 F (36.7 C)   98 F (36.7 C)  TempSrc: Oral   Oral  Resp: 22   20  Weight:      SpO2: 95% 100% 98% 100%   Weight change:   Physical Exam: General: awokebn from sleep , alert NAD Heart: RRR, no rub ,mur Lungs: Coarse BS bilat.  Abdomen: obese, soft ,nt,nd Extremities:no pedal edema Dialysis Access: R IJ perm cath / LUA AVF (at insert site dry dressing with open surgical site dry )    OP Dialysis prescription: Tuesday, Thursday, Saturday at [facility] 4 hours 30 minutes, 180 dialyzer,  estimated dry weight 138kg, 2K/3.5 calcium dialysate, no UF profile of sodium modeling. Right IJ tunneled hemodialysis catheter. Hectorol 2 g IV 3 times a week. Heparin 4200 unit bolus and Mircera 75 g IV every 2 weeks.  Problem/Plan: 1. Hypercarbic respiratory failure: now extubated on 6700/ management of bronchospasm per CCM. This am  1 view Chest x-ray shows ?chf  Hd today on schedule attempt 1.5 to 2l uf 2. End-stage renal disease: Hemodialysis done 1/03 and 1/04  With uf yest 1405  To 137.3 kg wt edw 138 /  Am k 4.0 Normal schedule TTS/ hd today to keep on schedule  .4.0 k  bath (some ho noncompliance as OP HD attendance) 3. Anemia:  9.7hgb  off ESA/ restart esa. 4. CKD-MBD: low phosphorus levels, not on binders fu labs now post extubated and eating brk. 5. Hypertension/volume: Bp= 100/54 this am required pressors in ICU / monitor  bp no midodrine as op/ "had been on in past" per pt  Start Midodrine  pre hd on tts  Lenny Pastel, PA-C [facility] Beeper [phone] 03/26/2015,8:28 AM  LOS: 2 days   Labs: Basic Metabolic Panel:  Recent Labs Lab 03/24/15 1200 03/24/15 1247 03/25/15 0500 03/26/15 0420  NA 143 142 139  140  K 4.1 4.1 2.7* 4.0  CL 101 102 97* 98*  CO2 26  --  24 28  GLUCOSE 158* 149* 93 83  BUN 69* 70* 28* 26*  CREATININE 18.23* 16.90* 8.89* 9.00*  CALCIUM 7.9*  --  9.0 8.5*  PHOS  --   --  2.2*  --    Liver Function Tests:  Recent Labs Lab 03/25/15 0500  ALBUMIN 3.4*   No results for input(s): LIPASE, AMYLASE in the last 168 hours.  Recent Labs Lab 03/24/15 1256  AMMONIA 34   CBC:  Recent Labs Lab 03/24/15 1200 03/24/15 1247 03/25/15 0500 03/26/15 0420  WBC 6.6  --  8.3 6.1  HGB 10.1* 12.6* 10.3* 9.7*  HCT 35.8* 37.0* 34.0* 33.9*  MCV 98.9  --  93.9 100.3*  PLT 200  --  216 155   Cardiac Enzymes: No results for input(s): CKTOTAL, CKMB, CKMBINDEX, TROPONINI in the last 168 hours. CBG:  Recent Labs Lab 03/25/15 1148 03/25/15 1909 03/26/15 0033 03/26/15 0503 03/26/15 0759  GLUCAP 70 77 116* 82 82    Studies/Results: Dg Chest Port 1 View  03/26/2015  CLINICAL DATA:  Respiratory failure. EXAM: PORTABLE CHEST 1 VIEW COMPARISON:  03/25/2015.  01/22/2015. FINDINGS: Interim extubation and removal of NG tube. Dialysis catheter left IJ line stable position. Cardiomegaly with  pulmonary vascular prominence and mild interstitial prominence suggesting mild congestive heart failure. Small left pleural effusion cannot be excluded. Low lung volumes with basilar atelectasis. No pneumothorax. Nodular density is noted over the right supra clavicular soft tissues. I IMPRESSION: 1. Interim extubation and removal of NG tube. Dialysis catheter and left IJ line in stable position. 2. Cardiomegaly with pulmonary vascular prominence and bilateral interstitial prominence suggesting congestive heart failure. Small left pleural effusion cannot be excluded. Low lung volumes with basilar atelectasis . 3. Soft tissue nodular density is noted over the right supraclavicular region. Clinical correlation suggested. Electronically Signed   By: Maisie Fus  Register   On: 03/26/2015 07:42   Dg Chest Port  1 View  03/25/2015  CLINICAL DATA:  Acute respiratory failure. EXAM: PORTABLE CHEST 1 VIEW COMPARISON:  03/24/2015. FINDINGS: ET tube 4.5 cm above carina. Unchanged central venous line, double lumen dialysis catheter, and enteric tube. Cardiomegaly. Decreased lung volumes with moderate edema but has worsened from yesterday's examination. No pneumothorax. IMPRESSION: Worsening aeration. Decreased lung volumes with moderate edema. Tubes and lines are stable. Electronically Signed   By: Elsie Stain M.D.   On: 03/25/2015 07:34   Dg Chest Port 1 View  03/24/2015  CLINICAL DATA:  Central line placement EXAM: PORTABLE CHEST 1 VIEW COMPARISON:  03/24/2015 FINDINGS: A left jugular central venous catheter has been placed. The tip is in the upper SVC. No ensuing pneumothorax. Endotracheal tube, NG tube, and right jugular dialysis catheter are stable. Cardiomegaly is unchanged. Low lung volumes have improved. No consolidation. IMPRESSION: New left jugular central venous catheter with its tip in the upper SVC and no pneumothorax. Improved lung volumes. Electronically Signed   By: Jolaine Click M.D.   On: 03/24/2015 19:53   Dg Chest Portable 1 View  03/24/2015  CLINICAL DATA:  Patient unresponsive.  Severe respiratory distress. EXAM: PORTABLE CHEST 1 VIEW COMPARISON:  Portable film earlier in the day. FINDINGS: ET tube has been inserted, and lies 3.8 cm above carina. Dual lumen catheter from RIGHT IJ approach unchanged, with tips in the RIGHT atrium. Cardiomegaly. Worsening pulmonary edema. Low lung volumes. Enteric tube placed in the stomach. IMPRESSION: ET tube good position, 3.8 cm above carina. ET tube good position 3.8 cm above carina. Worsening aeration, now with pulmonary edema. Electronically Signed   By: Elsie Stain M.D.   On: 03/24/2015 15:08   Dg Chest Portable 1 View  03/24/2015  CLINICAL DATA:  Shortness of Breath.  Chronic renal failure. EXAM: PORTABLE CHEST 1 VIEW COMPARISON:  February 23, 2015 FINDINGS:  There is persistent elevation of the right hemidiaphragm. There is no edema or consolidation. There is mild scarring in the left base. There is mild cardiomegaly with pulmonary vascularity within normal limits. Dual-lumen catheter with tips in superior vena cava. No pneumothorax. IMPRESSION: Central catheter as described without pneumothorax. No edema or consolidation. Slight scarring left base. Stable elevation right hemidiaphragm. Stable cardiac prominence. Electronically Signed   By: Bretta Bang III M.D.   On: 03/24/2015 13:20   Medications: . sodium chloride 30 mL/hr (03/25/15 0006)   . antiseptic oral rinse  7 mL Mouth Rinse 10 times per day  . chlorhexidine gluconate  15 mL Mouth Rinse BID  . ferric gluconate (FERRLECIT/NULECIT) IV  125 mg Intravenous Q T,Th,Sa-HD  . [START ON 04/07/2015] ferric gluconate (FERRLECIT/NULECIT) IV  62.5 mg Intravenous Q Tue-HD  . heparin subcutaneous  5,000 Units Subcutaneous 3 times per day  . insulin aspart  0-9 Units Subcutaneous 6  times per day  . pantoprazole (PROTONIX) IV  40 mg Intravenous Q24H

## 2015-03-26 NOTE — Progress Notes (Signed)
Hemodialysis= Pt signed off AMA after 2.5 hours on machine d/t cramps in fingers. No UF removed, net +. Report given to primary RN.

## 2015-03-26 NOTE — Progress Notes (Signed)
  Echocardiogram 2D Echocardiogram has been performed.  Delcie Roch 03/26/2015, 11:47 AM

## 2015-03-26 NOTE — Progress Notes (Signed)
Hemodialysis- Attempted to pick up pt for treatment. Pt refusing. "That will be 3 days in a row for me!" Reinforced importance of staying on regular HD schedule. Pt continued to refuse. Notified renal PA.

## 2015-03-27 LAB — CBC WITH DIFFERENTIAL/PLATELET
BASOS ABS: 0 10*3/uL (ref 0.0–0.1)
Basophils Relative: 0 %
EOS ABS: 0.2 10*3/uL (ref 0.0–0.7)
EOS PCT: 4 %
HCT: 35.5 % — ABNORMAL LOW (ref 39.0–52.0)
Hemoglobin: 10.2 g/dL — ABNORMAL LOW (ref 13.0–17.0)
LYMPHS PCT: 16 %
Lymphs Abs: 1 10*3/uL (ref 0.7–4.0)
MCH: 29 pg (ref 26.0–34.0)
MCHC: 28.7 g/dL — ABNORMAL LOW (ref 30.0–36.0)
MCV: 100.9 fL — AB (ref 78.0–100.0)
Monocytes Absolute: 0.6 10*3/uL (ref 0.1–1.0)
Monocytes Relative: 11 %
Neutro Abs: 4 10*3/uL (ref 1.7–7.7)
Neutrophils Relative %: 69 %
PLATELETS: 134 10*3/uL — AB (ref 150–400)
RBC: 3.52 MIL/uL — AB (ref 4.22–5.81)
RDW: 16.7 % — ABNORMAL HIGH (ref 11.5–15.5)
WBC: 5.9 10*3/uL (ref 4.0–10.5)

## 2015-03-27 LAB — RENAL FUNCTION PANEL
Albumin: 3.6 g/dL (ref 3.5–5.0)
Anion gap: 10 (ref 5–15)
BUN: 27 mg/dL — ABNORMAL HIGH (ref 6–20)
CALCIUM: 8.4 mg/dL — AB (ref 8.9–10.3)
CO2: 30 mmol/L (ref 22–32)
CREATININE: 9.19 mg/dL — AB (ref 0.61–1.24)
Chloride: 100 mmol/L — ABNORMAL LOW (ref 101–111)
GFR, EST AFRICAN AMERICAN: 7 mL/min — AB (ref >60)
GFR, EST NON AFRICAN AMERICAN: 6 mL/min — AB (ref >60)
Glucose, Bld: 113 mg/dL — ABNORMAL HIGH (ref 65–99)
PHOSPHORUS: 7.8 mg/dL — AB (ref 2.5–4.6)
Potassium: 4.7 mmol/L (ref 3.5–5.1)
SODIUM: 140 mmol/L (ref 135–145)

## 2015-03-27 LAB — GLUCOSE, CAPILLARY
GLUCOSE-CAPILLARY: 110 mg/dL — AB (ref 65–99)
GLUCOSE-CAPILLARY: 105 mg/dL — AB (ref 65–99)

## 2015-03-27 MED ORDER — DARBEPOETIN ALFA 100 MCG/0.5ML IJ SOSY: 100.0000 ug | PREFILLED_SYRINGE | INTRAMUSCULAR | Status: DC

## 2015-03-27 MED ORDER — CLONAZEPAM 0.5 MG PO TABS: 0.2500 mg | ORAL_TABLET | Freq: Two times a day (BID) | ORAL | Status: DC | PRN

## 2015-03-27 MED ORDER — TRAMADOL HCL 50 MG PO TABS
50.0000 mg | ORAL_TABLET | Freq: Two times a day (BID) | ORAL | Status: DC | PRN
Start: 2015-03-27 — End: 2015-04-15

## 2015-03-27 NOTE — Care Management Important Message (Signed)
Important Message  Patient Details  Name: Gary Rivera MRN: [id number] Date of Birth: [birth year]   Medicare Important Message Given:  Yes    Mikki Ziff P Maricel Swartzendruber 03/27/2015, 2:35 PM

## 2015-03-27 NOTE — Clinical Social Work Note (Signed)
Patient discharging home today and assistance with transport requested. Patient has oxygen tank to take home. CSW provided taxi voucher for patient to tranport home.   Genelle Bal, MSW, LCSW Licensed Clinical Social Worker Clinical Social Work Department [employer] [phone]

## 2015-03-27 NOTE — Progress Notes (Signed)
   03/27/15 1200  Clinical Encounter Type  Visited With Patient  Visit Type Spiritual support  Referral From Nurse  Spiritual Encounters  Spiritual Needs Emotional  Stress Factors  Patient Stress Factors Health changes;Other (Comment) (depression, intermittent)  Visited patient today after receiving call and consult. Unfortunately patient needed to speak to me yesterday, when I was not here, because that's when he was depressed. He indicated that today he was feeling pretty good. We discussed his methods of dealing with his depression when it occurs, and he seems to have good coping skills and also says he takes medication for it. I wished him well on his journey.

## 2015-03-27 NOTE — Progress Notes (Signed)
[facility] Progress Note  Patient: Gary Rivera ZOX:[id number]   PCP: [name], MD DOB: 06-18-1960   DOA: 03/24/2015   DOS: 03/26/2015   Date of Service: the patient was seen and examined on 03/26/2015  Subjective: patient c/o hand cramps, BP low.  Nutrition: able to tolerate oral Activity: ambulating in hallway Last BM: 03/25/2115  Assessment and Plan: 1. Acute on chronic respiratory failure (HCC) Was intubated and needed pressor in ER> Extubate and transferred to tele. Home o2 of 2LPM. Most likely due to non compliance with HD. Will monitor. Now better.  2.ESRD  Continue HD per nephro     Chronic systolic congestive heart failure, NYHA class 1 (HCC) Echo shows EF worsening Will follow up with cardio    HTN (hypertension) BP low, monitor    Pulmonary HTN- 50 mmHg 2011 ECHO Will need to remain complaint with HD    Obesity hypoventilation syndrome (HCC) Sleep study setup as an outpatient   DVT Prophylaxis: subcutaneous Heparin Nutrition: renal diet Advance goals of care discussion: full code Family Communication: no family was present at bedside, at the time of interview.   Disposition:  Expected discharge date:03/28/2015 Barriers to safe discharge: blood pressur   Filed Weights   03/25/15 0013 03/26/15 1152 03/26/15 1427  Weight: 137.3 kg (302 lb 11.1 oz) 131.3 kg (289 lb 7.4 oz) 133.4 kg (294 lb 1.5 oz)    Objective: Physical Exam: Filed Vitals:   03/26/15 1628 03/26/15 2156  BP: 91/45 93/57  Pulse: 81 81  Temp: 98.9 F (37.2 C) 98.5 F (36.9 C)  TempSrc: Oral Oral  Resp: 19 19  Weight:    SpO2: 99% 93%     General: Appear in mild distress, no Rash; Oral Mucosa moist. Cardiovascular: S1 and S2 Present, no Murmur, no JVD Respiratory: Bilateral Air entry present and Clear to Auscultation, no Crackles, no wheezes Abdomen: Bowel Sound prsent, Soft and no tenderness Extremities: no Pedal edema, no calf tenderness Neurology: Grossly no focal  neuro deficit.  Data Reviewed: CBC:  Recent Labs Lab 03/24/15 1200 03/24/15 1247 03/25/15 0500 03/26/15 0420  WBC 6.6  --  8.3 6.1  NEUTROABS  --   --   --   --   HGB 10.1* 12.6* 10.3* 9.7*  HCT 35.8* 37.0* 34.0* 33.9*  MCV 98.9  --  93.9 100.3*  PLT 200  --  216 155   Basic Metabolic Panel:  Recent Labs Lab 03/24/15 1200 03/24/15 1247 03/25/15 0500 03/26/15 0420  NA 143 142 139 140  K 4.1 4.1 2.7* 4.0  CL 101 102 97* 98*  CO2 26  --  24 28  GLUCOSE 158* 149* 93 83  BUN 69* 70* 28* 26*  CREATININE 18.23* 16.90* 8.89* 9.00*  CALCIUM 7.9*  --  9.0 8.5*  PHOS  --   --  2.2*  --     Recent Labs Lab 03/24/15 1256  AMMONIA 34    Cardiac Enzymes: No results for input(s): CKTOTAL, CKMB, CKMBINDEX, TROPONINI in the last 168 hours.  BNP (last 3 results)  Recent Labs  06/25/14 1317 02/02/15 0035  BNP 1060.8* 485.4*    CBG:  Recent Labs Lab 03/26/15 0503 03/26/15 0759 03/26/15 1504 03/26/15 1628  GLUCAP 82 82 120* 127*    Recent Results (from the past 240 hour(s))  Blood culture (routine x 2)     Status: None (Preliminary result)   Collection Time: 03/24/15 12:52 PM  Result Value Ref Range Status   Specimen Description  BLOOD LEFT ANTECUBITAL  Final   Special Requests IN PEDIATRIC BOTTLE 1CCS  Final   Culture NO GROWTH 1 DAY  Final   Report Status PENDING  Incomplete  Blood culture (routine x 2)     Status: None (Preliminary result)   Collection Time: 03/24/15  1:21 PM  Result Value Ref Range Status   Specimen Description BLOOD RIGHT HAND  Final   Special Requests BOTTLES DRAWN AEROBIC AND ANAEROBIC 5CC  Final   Culture NO GROWTH 2 DAYS  Final   Report Status PENDING  Incomplete  MRSA PCR Screening     Status: None   Collection Time: 03/24/15  5:20 PM  Result Value Ref Range Status   MRSA by PCR NEGATIVE NEGATIVE Final    Comment:        The GeneXpert MRSA Assay (FDA approved for NASAL specimens only), is one component of a comprehensive  MRSA colonization surveillance program. It is not intended to diagnose MRSA infection nor to guide or monitor treatment for MRSA infections.      Studies: No results found.   Scheduled Meds: . amitriptyline  25 mg Oral QHS  . [START ON 04/02/2015] darbepoetin (ARANESP) injection - DIALYSIS  100 mcg Intravenous Q Thu-HD  . ferric gluconate (FERRLECIT/NULECIT) IV  125 mg Intravenous Q T,Th,Sa-HD  . [START ON 04/07/2015] ferric gluconate (FERRLECIT/NULECIT) IV  62.5 mg Intravenous Q Tue-HD  . heparin subcutaneous  5,000 Units Subcutaneous 3 times per day  . lanthanum  2,000 mg Oral TID WC  . metoCLOPramide  10 mg Oral Daily  . midodrine  10 mg Oral TID WC  . multivitamin  1 tablet Oral QHS  . pantoprazole  40 mg Oral Daily   Continuous Infusions: . sodium chloride 30 mL/hr (03/25/15 0006)   PRN Meds: acetaminophen, albuterol, clonazePAM, traMADol  Time spent: 30 minutes  Author: Lynden Oxford, MD [facility] Pager: [phone] 03/26/2015 11:20 AM  If 7PM-7AM, please contact night-coverage at [email or web address], password [facility]

## 2015-03-27 NOTE — Progress Notes (Signed)
Gary Rivera to be D/C'd Home per MD order.  Discussed prescriptions and follow up appointments with the patient. Prescriptions given to patient, medication list explained in detail. Pt verbalized understanding.   Pt given cab voucher, pt says he has a pair brown shoes that are missing. Called previous unit and security, RN and nurse tech searched room. NO shoes found. Pt given extra socks and shoe covers.     Medication List    STOP taking these medications        Oxycodone HCl 10 MG Tabs      TAKE these medications        albuterol 108 (90 Base) MCG/ACT inhaler  Commonly known as:  PROVENTIL HFA;VENTOLIN HFA  Inhale 1 puff into the lungs every 6 (six) hours as needed for wheezing or shortness of breath.     amitriptyline 25 MG tablet  Commonly known as:  ELAVIL  Take 25 mg by mouth at bedtime. Reported on 03/12/2015     clonazePAM 0.5 MG tablet  Commonly known as:  KLONOPIN  Take 0.5 tablets (0.25 mg total) by mouth 2 (two) times daily as needed for anxiety.     lanthanum 1000 MG chewable tablet  Commonly known as:  FOSRENOL  Chew 2 tablets (2,000 mg total) by mouth 3 (three) times daily with meals.     metoCLOPramide 10 MG tablet  Commonly known as:  REGLAN  Take 10 mg by mouth daily.     multivitamin Tabs tablet  Take 1 tablet by mouth at bedtime.     Omega-3 1000 MG Caps  Take 1 g by mouth 2 (two) times daily.     omeprazole 20 MG capsule  Commonly known as:  PRILOSEC  Take 20 mg by mouth daily.     OXYGEN  2 lpm 24/7 AHC     traMADol 50 MG tablet  Commonly known as:  ULTRAM  Take 1 tablet (50 mg total) by mouth every 12 (twelve) hours as needed for severe pain.     vitamin E 400 UNIT capsule  Take 400 Units by mouth daily.        Filed Vitals:   03/27/15 0840 03/27/15 1533  BP: 99/52 91/42  Pulse: 84 85  Temp: 98.3 F (36.8 C) 99.2 F (37.3 C)  Resp: 18 19    Skin clean, dry and intact without evidence of skin break down, no evidence of  skin tears noted. IV catheter discontinued intact. Site without signs and symptoms of complications. Dressing and pressure applied. Pt denies pain at this time. No complaints noted.  An After Visit Summary was printed and given to the patient. Patient escorted via WC, and D/C home via private auto.  [name] 03/27/2015 5:06 PM

## 2015-03-27 NOTE — Progress Notes (Signed)
Subjective:  Yesterday noted signed off early from HD sec cramps/ Midodrine started yest pre hd/ No cos now eating Breakfast, walking in room no sob.  Objective Vital signs in last 24 hours: Filed Vitals:   03/26/15 1427 03/26/15 1628 03/26/15 2156 03/27/15 0453  BP: 101/69  Pulse: 83 81 81 96  Temp: 97.9 F (36.6 C) 98.9 F (37.2 C) 98.5 F (36.9 C) 97.5 F (36.4 C)  TempSrc: Oral Oral Oral Oral  Resp: Weight: 133.4 kg (294 lb 1.5 oz)     SpO2: 99% 99% 93% 95%   Weight change:   Physical Exam: General:  alert NAD Heart: RRR, no rub ,mur Lungs: Coarse BS bilat.  Abdomen: obese, soft ,nt,nd Extremities trace  pedal edema Dialysis Access: R IJ perm cath / LUA AVF  Pos bruit  OP Dialysis prescription: Tuesday, Thursday, Saturday at [facility] 4 hours 30 minutes, 180 dialyzer, estimated dry weight 138kg, 2K/3.5 calcium dialysate, no UF profile of sodium modeling. Right IJ tunneled hemodialysis catheter. Hectorol 2 g IV 3 times a week. Heparin 4200 unit bolus and Mircera 75 g IV every 2 weeks.  Problem/Plan: 1. Hypercarbic respiratory failure:  extubated / using O2  As at home/ management of bronchospasm per CCM.  As op followed per Dr. Sherene Sires NO cpap at home per pt has sleep apnea study "sometime soon:" 2. End-stage renal disease: Hemodialysis done 1/03 and 1/10for uf /and 1/05 to keep on schedule.post wt yest. To 133.4  And given 341 cc for bp low / old edw 138 /Normal schedule TTS/ bath (ho noncompliance as OP HD attendance) 3. Anemia: 9.7hgb/ on fe / off ESA/ restart esa. 4. CKD-MBD: low phosphorus levels, not on binders fu labs now post extubated and eating brk. 5. Hypertension/volume: Bp= 91/45 this am/Midodrine  pre hd on tts started yest. required pressors in ICU / monitor bp no midodrine as op/   Lenny Pastel, PA-C [facility] Beeper [phone] 03/27/2015,8:15 AM  LOS: 3 days  I have seen and  examined this patient and agree with plan per Lenny Pastel.  Pt says he feels like going home and has made arrangements for transportation to HD in [place] tomorrow.  OK to DC from renal stanpoint. Neely Kammerer T,MD 03/27/2015 11:05 AM Labs: Basic Metabolic Panel:  Recent Labs Lab 03/24/15 1200 03/24/15 1247 03/25/15 0500 03/26/15 0420  NA 143 142 139 140  K 4.1 4.1 2.7* 4.0  CL 101 102 97* 98*  CO2 26  --  24 28  GLUCOSE 158* 149* 93 83  BUN 69* 70* 28* 26*  CREATININE 18.23* 16.90* 8.89* 9.00*  CALCIUM 7.9*  --  9.0 8.5*  PHOS  --   --  2.2*  --    Liver Function Tests:  Recent Labs Lab 03/25/15 0500  ALBUMIN 3.4*   No results for input(s): LIPASE, AMYLASE in the last 168 hours.  Recent Labs Lab 03/24/15 1256  AMMONIA 34   CBC:  Recent Labs Lab 03/24/15 1200 03/24/15 1247 03/25/15 0500 03/26/15 0420  WBC 6.6  --  8.3 6.1  HGB 10.1* 12.6* 10.3* 9.7*  HCT 35.8* 37.0* 34.0* 33.9*  MCV 98.9  --  93.9 100.3*  PLT 200  --  216 155   Cardiac Enzymes: No results for input(s): CKTOTAL, CKMB, CKMBINDEX, TROPONINI in the last 168 hours. CBG:  Recent Labs Lab 03/26/15 0033 03/26/15 0503 03/26/15 0759 03/26/15 1504 03/26/15 1628  GLUCAP 116*  82 82 120* 127*    Studies/Results: Dg Chest Port 1 View  03/26/2015  CLINICAL DATA:  Respiratory failure. EXAM: PORTABLE CHEST 1 VIEW COMPARISON:  03/25/2015.  01/22/2015. FINDINGS: Interim extubation and removal of NG tube. Dialysis catheter left IJ line stable position. Cardiomegaly with pulmonary vascular prominence and mild interstitial prominence suggesting mild congestive heart failure. Small left pleural effusion cannot be excluded. Low lung volumes with basilar atelectasis. No pneumothorax. Nodular density is noted over the right supra clavicular soft tissues. I IMPRESSION: 1. Interim extubation and removal of NG tube. Dialysis catheter and left IJ line in stable position. 2. Cardiomegaly with pulmonary vascular  prominence and bilateral interstitial prominence suggesting congestive heart failure. Small left pleural effusion cannot be excluded. Low lung volumes with basilar atelectasis . 3. Soft tissue nodular density is noted over the right supraclavicular region. Clinical correlation suggested. Electronically Signed   By: Maisie Fus  Register   On: 03/26/2015 07:42   Medications: . sodium chloride 30 mL/hr (03/25/15 0006)   . amitriptyline  25 mg Oral QHS  . ferric gluconate (FERRLECIT/NULECIT) IV  125 mg Intravenous Q T,Th,Sa-HD  . [START ON 04/07/2015] ferric gluconate (FERRLECIT/NULECIT) IV  62.5 mg Intravenous Q Tue-HD  . heparin subcutaneous  5,000 Units Subcutaneous 3 times per day  . lanthanum  2,000 mg Oral TID WC  . metoCLOPramide  10 mg Oral Daily  . midodrine  10 mg Oral TID WC  . multivitamin  1 tablet Oral QHS  . pantoprazole  40 mg Oral Daily

## 2015-03-28 NOTE — Discharge Summary (Signed)
[facility] Discharge Summary   Patient: Gary Rivera WUJ:[id number]   PCP: [name], MD DOB: [birth year]   Date of admission: 03/24/2015   Date of discharge:  03/27/2015   Discharge Diagnoses:  Principal Problem:   Acute on chronic respiratory failure (HCC) Active Problems:   Chronic systolic congestive heart failure, NYHA class 1 (HCC)   HTN (hypertension)   Pulmonary HTN- 50 mmHg 2011 ECHO   Mitral regurgitation (MODERATE)   Obesity (BMI 30-39.9)   Obesity hypoventilation syndrome (HCC)   End stage renal disease-  on HD   Encounter for central line placement   Recommendations for Outpatient Follow-up:  1. Follow-up with cardiology for cardiomyopathy and remain compliant with HD  Discharge Instructions    Diet - low sodium heart healthy    Complete by:  As directed      Diet renal [id number]    Complete by:  As directed      Discharge instructions    Complete by:  As directed   It is important that you read following instructions as well as go over your medication list with RN to help you understand your care after this hospitalization.  Discharge Instructions: Please follow up with PCP in 1 week, and remain complaint with HD.   Please request your primary care physician to go over all Hospital Tests and Procedure/Radiological results at the follow up,  Please get all Hospital records sent to your PCP by signing hospital release before you go home.   Do not drive, operating heavy machinery, perform activities at heights, swimming or participation in water activities or provide baby sitting services if your were admitted for while your are on Pain, Sleep and Anxiety Medications; until you have been seen by Primary Care Physician or a Neurologist and advised to do so again. Do not take more than prescribed Pain, Sleep and Anxiety Medications. You were cared for by a hospitalist during your hospital stay. If you have any questions about your discharge medications or  the care you received while you were in the hospital after you are discharged, you can call the unit and ask to speak with the hospitalist on call if the hospitalist that took care of you is not available.  Once you are discharged, your primary care physician will handle any further medical issues. Please note that NO REFILLS for any discharge medications will be authorized once you are discharged, as it is imperative that you return to your primary care physician (or establish a relationship with a primary care physician if you do not have one) for your aftercare needs so that they can reassess your need for medications and monitor your lab values. You Must read complete instructions/literature along with all the possible adverse reactions/side effects for all the Medicines you take and that have been prescribed to you. Take any new Medicines after you have completely understood and accept all the possible adverse reactions/side effects. Wear Seat belts while driving. If you have smoked or chewed Tobacco in the last 2 yrs please stop smoking and/or stop any Recreational drug use.     Increase activity slowly    Complete by:  As directed           Follow-up Information    Follow up with [facility], MD On 03/31/2015.   Specialty:  Internal Medicine   Why:  APPOINTMENT:  Tuesday, 03-31-15 @ 2:30pm   Contact information:   [address] [place] [address] [place] [address] [phone]  Follow up with Revonda Humphrey, MD. Schedule an appointment as soon as possible for a visit in 1 month.   Specialty:  Internal Medicine   Why:  cardiomyopathy, previously established patient   Contact information:   [address] [place] [address] [phone]      Diet recommendation: Renal diet  Activity: The patient is advised to gradually reintroduce usual activities.  Discharge Condition: good  History of present illness: As per the H and P dictated on admission, "55 yo male with hx of  ESRD on HD. He was last seen 4 days prior to admission. He missed HD because he wasn't feeling well. He was tx for bronchitis last month. He was hypoxia, and ABG showed respiratory acidosis. He required intubation. PCCM asked to admit."  Hospital Course:  Summary of his active problems in the hospital is as following.  1. Acute on chronic respiratory failure (HCC) Was intubated and needed pressor in ICU. was found to be having acute respiratory acidosis as well Extubate and transferred to tele. Home o2 of 2LPM. Most likely due to non compliance with HD.At his baseline  2.ESRD  Continue HD per nephro Patient need to remain compliant with his hemodialysis schedule.  3. Chronic systolic congestive heart failure, NYHA class 1 (HCC) Echo shows EF worsening, unable to use cardio protective medication secondary to low blood pressure Patient Will follow up with cardiology test  4. HTN (hypertension) BP low, no indication for treatment at present  5. Pulmonary HTN- 50 mmHg 2011 ECHO Will need to remain complaint with HD  6. Obesity hypoventilation syndrome (HCC) Sleep study setup as an outpatient  7.. Chronic pain syndrome. Patient initially presented with acute hypoxic and hypercarbic respiratory acidosis. Patient did not require any narcotics or pain medication here in the hospital. Recommended patient to discontinue his prior oxycodone and also reduce the dose of Klonopin. On discharge  All other chronic medical condition were stable during the hospitalization.  Patient was ambulatory without any assistance. On the day of the discharge the patient's vitals remained stable, and no other acute medical condition were reported by patient. the patient was felt safe to be discharge at home with family support.  Procedures and Results:  Intubation   EchocardiogramStudy Conclusions  - Left ventricle: The cavity size was moderately dilated. Wall thickness was normal.  Systolic function was moderately reduced. The estimated ejection fraction was in the range of 35% to 40%. Diffuse hypokinesis. Doppler parameters are consistent with abnormal left ventricular relaxation (grade 1 diastolic dysfunction). - Aortic valve: There was trivial regurgitation. - Left atrium: The atrium was mildly dilated. - Right ventricle: Systolic function was moderately reduced. - Right atrium: The atrium was mildly dilated. - Atrial septum: The septum bowed from right to left, consistent with increased right atrial pressure. - Pulmonary arteries: Systolic pressure was mildly increased. PA peak pressure: 41 mm Hg (S).  Hemodialysis  Consultations:  Nephrology  DISCHARGE MEDICATION: Discharge Medication List as of 03/27/2015  3:20 PM    START taking these medications   Details  traMADol (ULTRAM) 50 MG tablet Take 1 tablet (50 mg total) by mouth every 12 (twelve) hours as needed for severe pain., Starting 03/27/2015, Until Discontinued, Print      CONTINUE these medications which have CHANGED   Details  clonazePAM (KLONOPIN) 0.5 MG tablet Take 0.5 tablets (0.25 mg total) by mouth 2 (two) times daily as needed for anxiety., Starting 03/27/2015, Until Discontinued, No Print      CONTINUE these  medications which have NOT CHANGED   Details  albuterol (PROVENTIL HFA;VENTOLIN HFA) 108 (90 BASE) MCG/ACT inhaler Inhale 1 puff into the lungs every 6 (six) hours as needed for wheezing or shortness of breath., Until Discontinued, Historical Med    amitriptyline (ELAVIL) 25 MG tablet Take 25 mg by mouth at bedtime. Reported on 03/12/2015, Until Discontinued, Historical Med    lanthanum (FOSRENOL) 1000 MG chewable tablet Chew 2 tablets (2,000 mg total) by mouth 3 (three) times daily with meals., Starting 02/05/2015, Until Discontinued, Normal    metoCLOPramide (REGLAN) 10 MG tablet Take 10 mg by mouth daily., Starting 05/03/2014, Until Discontinued, Historical Med     multivitamin (RENA-VIT) TABS tablet Take 1 tablet by mouth at bedtime., Starting 05/21/2014, Until Discontinued, Print    Omega-3 1000 MG CAPS Take 1 g by mouth 2 (two) times daily., Until Discontinued, Historical Med    omeprazole (PRILOSEC) 20 MG capsule Take 20 mg by mouth daily., Starting 02/10/2015, Until Discontinued, Historical Med    OXYGEN 2 lpm 24/7 [facility], Until Discontinued, Historical Med    vitamin E 400 UNIT capsule Take 400 Units by mouth daily., Until Discontinued, Historical Med      STOP taking these medications     Oxycodone HCl 10 MG TABS        Allergies  Allergen Reactions  . Renvela [Sevelamer] Nausea And Vomiting    Discharge Exam: Filed Weights   03/25/15 0013 03/26/15 1152 03/26/15 1427  Weight: 137.3 kg (302 lb 11.1 oz) 131.3 kg (289 lb 7.4 oz) 133.4 kg (294 lb 1.5 oz)   Filed Vitals:   03/27/15 0840 03/27/15 1533  BP: 99/52 91/42  Pulse: 84 85  Temp: 98.3 F (36.8 C) 99.2 F (37.3 C)  Resp: 18 19   General: Appear in no distress, no Rash; Oral Mucosa moist. Cardiovascular: S1 and S2 Present, n Murmur, o JVD Respiratory: Bilateral Air entry present and Clear to Auscultation, no Crackles, no wheezes Abdomen: Bowel Sound [resent, Soft and n tenderness Extremities: o Pedal edema, no calf tenderness Neurology: Grossly no focal neuro deficit.   The results of significant diagnostics from this hospitalization (including imaging, microbiology, ancillary and laboratory) are listed below for reference.    Significant Diagnostic Studies: Dg Chest Port 1 View  03/26/2015  CLINICAL DATA:  Respiratory failure. EXAM: PORTABLE CHEST 1 VIEW COMPARISON:  03/25/2015.  01/22/2015. FINDINGS: Interim extubation and removal of NG tube. Dialysis catheter left IJ line stable position. Cardiomegaly with pulmonary vascular prominence and mild interstitial prominence suggesting mild congestive heart failure. Small left pleural effusion cannot be excluded. Low lung  volumes with basilar atelectasis. No pneumothorax. Nodular density is noted over the right supra clavicular soft tissues. I IMPRESSION: 1. Interim extubation and removal of NG tube. Dialysis catheter and left IJ line in stable position. 2. Cardiomegaly with pulmonary vascular prominence and bilateral interstitial prominence suggesting congestive heart failure. Small left pleural effusion cannot be excluded. Low lung volumes with basilar atelectasis . 3. Soft tissue nodular density is noted over the right supraclavicular region. Clinical correlation suggested. Electronically Signed   By: Maisie Fus  Register   On: 03/26/2015 07:42   Dg Chest Port 1 View  03/25/2015  CLINICAL DATA:  Acute respiratory failure. EXAM: PORTABLE CHEST 1 VIEW COMPARISON:  03/24/2015. FINDINGS: ET tube 4.5 cm above carina. Unchanged central venous line, double lumen dialysis catheter, and enteric tube. Cardiomegaly. Decreased lung volumes with moderate edema but has worsened from yesterday's examination. No pneumothorax. IMPRESSION: Worsening aeration. Decreased  lung volumes with moderate edema. Tubes and lines are stable. Electronically Signed   By: Elsie Stain M.D.   On: 03/25/2015 07:34   Dg Chest Port 1 View  03/24/2015  CLINICAL DATA:  Central line placement EXAM: PORTABLE CHEST 1 VIEW COMPARISON:  03/24/2015 FINDINGS: A left jugular central venous catheter has been placed. The tip is in the upper SVC. No ensuing pneumothorax. Endotracheal tube, NG tube, and right jugular dialysis catheter are stable. Cardiomegaly is unchanged. Low lung volumes have improved. No consolidation. IMPRESSION: New left jugular central venous catheter with its tip in the upper SVC and no pneumothorax. Improved lung volumes. Electronically Signed   By: Jolaine Click M.D.   On: 03/24/2015 19:53   Dg Chest Portable 1 View  03/24/2015  CLINICAL DATA:  Patient unresponsive.  Severe respiratory distress. EXAM: PORTABLE CHEST 1 VIEW COMPARISON:  Portable film  earlier in the day. FINDINGS: ET tube has been inserted, and lies 3.8 cm above carina. Dual lumen catheter from RIGHT IJ approach unchanged, with tips in the RIGHT atrium. Cardiomegaly. Worsening pulmonary edema. Low lung volumes. Enteric tube placed in the stomach. IMPRESSION: ET tube good position, 3.8 cm above carina. ET tube good position 3.8 cm above carina. Worsening aeration, now with pulmonary edema. Electronically Signed   By: Elsie Stain M.D.   On: 03/24/2015 15:08   Dg Chest Portable 1 View  03/24/2015  CLINICAL DATA:  Shortness of Breath.  Chronic renal failure. EXAM: PORTABLE CHEST 1 VIEW COMPARISON:  February 23, 2015 FINDINGS: There is persistent elevation of the right hemidiaphragm. There is no edema or consolidation. There is mild scarring in the left base. There is mild cardiomegaly with pulmonary vascularity within normal limits. Dual-lumen catheter with tips in superior vena cava. No pneumothorax. IMPRESSION: Central catheter as described without pneumothorax. No edema or consolidation. Slight scarring left base. Stable elevation right hemidiaphragm. Stable cardiac prominence. Electronically Signed   By: Bretta Bang III M.D.   On: 03/24/2015 13:20    Microbiology: Recent Results (from the past 240 hour(s))  Blood culture (routine x 2)     Status: None (Preliminary result)   Collection Time: 03/24/15 12:52 PM  Result Value Ref Range Status   Specimen Description BLOOD LEFT ANTECUBITAL  Final   Special Requests IN PEDIATRIC BOTTLE 1CCS  Final   Culture NO GROWTH 3 DAYS  Final   Report Status PENDING  Incomplete  Blood culture (routine x 2)     Status: None (Preliminary result)   Collection Time: 03/24/15  1:21 PM  Result Value Ref Range Status   Specimen Description BLOOD RIGHT HAND  Final   Special Requests BOTTLES DRAWN AEROBIC AND ANAEROBIC 5CC  Final   Culture NO GROWTH 4 DAYS  Final   Report Status PENDING  Incomplete  MRSA PCR Screening     Status: None    Collection Time: 03/24/15  5:20 PM  Result Value Ref Range Status   MRSA by PCR NEGATIVE NEGATIVE Final    Comment:        The GeneXpert MRSA Assay (FDA approved for NASAL specimens only), is one component of a comprehensive MRSA colonization surveillance program. It is not intended to diagnose MRSA infection nor to guide or monitor treatment for MRSA infections.      Labs: CBC:  Recent Labs Lab 03/24/15 1200 03/24/15 1247 03/25/15 0500 03/26/15 0420 03/27/15 0750  WBC 6.6  --  8.3 6.1 5.9  NEUTROABS  --   --   --   --  4.0  HGB 10.1* 12.6* 10.3* 9.7* 10.2*  HCT 35.8* 37.0* 34.0* 33.9* 35.5*  MCV 98.9  --  93.9 100.3* 100.9*  PLT 200  --  216 155 134*   Basic Metabolic Panel:  Recent Labs Lab 03/24/15 1200 03/24/15 1247 03/25/15 0500 03/26/15 0420 03/27/15 0750  NA 143 142 139 140 140  K 4.1 4.1 2.7* 4.0 4.7  CL 101 102 97* 98* 100*  CO2 26  --  GLUCOSE 158* 149* 93 83 113*  BUN 69* 70* 28* 26* 27*  CREATININE 18.23* 16.90* 8.89* 9.00* 9.19*  CALCIUM 7.9*  --  9.0 8.5* 8.4*  PHOS  --   --  2.2*  --  7.8*   Liver Function Tests:  Recent Labs Lab 03/25/15 0500 03/27/15 0750  ALBUMIN 3.4* 3.6   No results for input(s): LIPASE, AMYLASE in the last 168 hours.  Recent Labs Lab 03/24/15 1256  AMMONIA 34   Cardiac Enzymes: No results for input(s): CKTOTAL, CKMB, CKMBINDEX, TROPONINI in the last 168 hours. BNP (last 3 results)  Recent Labs  06/25/14 1317 02/02/15 0035  BNP 1060.8* 485.4*   CBG:  Recent Labs Lab 03/26/15 0759 03/26/15 1504 03/26/15 1628 03/27/15 0907 03/27/15 1142  GLUCAP 82 120* 127* 110* 105*   Time spent: 30 minutes  Signed:  PATEL, PRANAV  [facility] , 5:57 PM

## 2015-03-29 ENCOUNTER — Emergency Department (HOSPITAL_COMMUNITY): Payer: Medicare Other

## 2015-03-29 ENCOUNTER — Encounter (HOSPITAL_COMMUNITY): Payer: Self-pay | Admitting: Emergency Medicine

## 2015-03-29 ENCOUNTER — Inpatient Hospital Stay (HOSPITAL_COMMUNITY)
Admission: EM | Admit: 2015-03-29 | Discharge: 2015-04-15 | DRG: 004 | Disposition: A | Discharge status: Other Acute Inpatient Hospital | Payer: Medicare Other | Attending: Internal Medicine | Admitting: Internal Medicine

## 2015-03-29 DIAGNOSIS — Z9911 Dependence on respirator [ventilator] status: Secondary | ICD-10-CM | POA: Diagnosis not present

## 2015-03-29 DIAGNOSIS — J9811 Atelectasis: Secondary | ICD-10-CM | POA: Diagnosis not present

## 2015-03-29 DIAGNOSIS — Z4659 Encounter for fitting and adjustment of other gastrointestinal appliance and device: Secondary | ICD-10-CM | POA: Insufficient documentation

## 2015-03-29 DIAGNOSIS — J9621 Acute and chronic respiratory failure with hypoxia: Secondary | ICD-10-CM | POA: Diagnosis present

## 2015-03-29 DIAGNOSIS — L8990 Pressure ulcer of unspecified site, unspecified stage: Secondary | ICD-10-CM | POA: Insufficient documentation

## 2015-03-29 DIAGNOSIS — I9589 Other hypotension: Secondary | ICD-10-CM | POA: Diagnosis present

## 2015-03-29 DIAGNOSIS — Z23 Encounter for immunization: Secondary | ICD-10-CM | POA: Diagnosis not present

## 2015-03-29 DIAGNOSIS — Z9119 Patient's noncompliance with other medical treatment and regimen: Secondary | ICD-10-CM

## 2015-03-29 DIAGNOSIS — J9690 Respiratory failure, unspecified, unspecified whether with hypoxia or hypercapnia: Secondary | ICD-10-CM | POA: Diagnosis present

## 2015-03-29 DIAGNOSIS — D696 Thrombocytopenia, unspecified: Secondary | ICD-10-CM | POA: Diagnosis not present

## 2015-03-29 DIAGNOSIS — E876 Hypokalemia: Secondary | ICD-10-CM | POA: Diagnosis present

## 2015-03-29 DIAGNOSIS — J9600 Acute respiratory failure, unspecified whether with hypoxia or hypercapnia: Secondary | ICD-10-CM

## 2015-03-29 DIAGNOSIS — G9341 Metabolic encephalopathy: Secondary | ICD-10-CM | POA: Diagnosis not present

## 2015-03-29 DIAGNOSIS — I272 Other secondary pulmonary hypertension: Secondary | ICD-10-CM | POA: Diagnosis present

## 2015-03-29 DIAGNOSIS — J440 Chronic obstructive pulmonary disease with acute lower respiratory infection: Secondary | ICD-10-CM | POA: Diagnosis present

## 2015-03-29 DIAGNOSIS — Z930 Tracheostomy status: Secondary | ICD-10-CM

## 2015-03-29 DIAGNOSIS — I959 Hypotension, unspecified: Secondary | ICD-10-CM | POA: Diagnosis not present

## 2015-03-29 DIAGNOSIS — E662 Morbid (severe) obesity with alveolar hypoventilation: Secondary | ICD-10-CM | POA: Diagnosis present

## 2015-03-29 DIAGNOSIS — I132 Hypertensive heart and chronic kidney disease with heart failure and with stage 5 chronic kidney disease, or end stage renal disease: Secondary | ICD-10-CM | POA: Diagnosis present

## 2015-03-29 DIAGNOSIS — Z9115 Patient's noncompliance with renal dialysis: Secondary | ICD-10-CM

## 2015-03-29 DIAGNOSIS — G4733 Obstructive sleep apnea (adult) (pediatric): Secondary | ICD-10-CM

## 2015-03-29 DIAGNOSIS — Y95 Nosocomial condition: Secondary | ICD-10-CM | POA: Diagnosis present

## 2015-03-29 DIAGNOSIS — R0902 Hypoxemia: Secondary | ICD-10-CM | POA: Diagnosis not present

## 2015-03-29 DIAGNOSIS — E8729 Other acidosis: Secondary | ICD-10-CM

## 2015-03-29 DIAGNOSIS — J9620 Acute and chronic respiratory failure, unspecified whether with hypoxia or hypercapnia: Secondary | ICD-10-CM

## 2015-03-29 DIAGNOSIS — N2581 Secondary hyperparathyroidism of renal origin: Secondary | ICD-10-CM | POA: Diagnosis present

## 2015-03-29 DIAGNOSIS — Z452 Encounter for adjustment and management of vascular access device: Secondary | ICD-10-CM | POA: Diagnosis not present

## 2015-03-29 DIAGNOSIS — R570 Cardiogenic shock: Secondary | ICD-10-CM | POA: Diagnosis not present

## 2015-03-29 DIAGNOSIS — Z6841 Body Mass Index (BMI) 40.0 and over, adult: Secondary | ICD-10-CM | POA: Diagnosis not present

## 2015-03-29 DIAGNOSIS — R680 Hypothermia, not associated with low environmental temperature: Secondary | ICD-10-CM | POA: Diagnosis not present

## 2015-03-29 DIAGNOSIS — E872 Acidosis: Secondary | ICD-10-CM | POA: Diagnosis present

## 2015-03-29 DIAGNOSIS — K219 Gastro-esophageal reflux disease without esophagitis: Secondary | ICD-10-CM | POA: Diagnosis present

## 2015-03-29 DIAGNOSIS — J810 Acute pulmonary edema: Secondary | ICD-10-CM | POA: Diagnosis not present

## 2015-03-29 DIAGNOSIS — F411 Generalized anxiety disorder: Secondary | ICD-10-CM | POA: Diagnosis present

## 2015-03-29 DIAGNOSIS — T17990A Other foreign object in respiratory tract, part unspecified in causing asphyxiation, initial encounter: Secondary | ICD-10-CM | POA: Diagnosis not present

## 2015-03-29 DIAGNOSIS — G9340 Encephalopathy, unspecified: Secondary | ICD-10-CM | POA: Insufficient documentation

## 2015-03-29 DIAGNOSIS — E46 Unspecified protein-calorie malnutrition: Secondary | ICD-10-CM | POA: Diagnosis present

## 2015-03-29 DIAGNOSIS — R0602 Shortness of breath: Secondary | ICD-10-CM | POA: Diagnosis present

## 2015-03-29 DIAGNOSIS — J9622 Acute and chronic respiratory failure with hypercapnia: Secondary | ICD-10-CM | POA: Diagnosis present

## 2015-03-29 DIAGNOSIS — J9601 Acute respiratory failure with hypoxia: Secondary | ICD-10-CM | POA: Diagnosis not present

## 2015-03-29 DIAGNOSIS — Z9289 Personal history of other medical treatment: Secondary | ICD-10-CM

## 2015-03-29 DIAGNOSIS — F329 Major depressive disorder, single episode, unspecified: Secondary | ICD-10-CM | POA: Diagnosis present

## 2015-03-29 DIAGNOSIS — Z992 Dependence on renal dialysis: Secondary | ICD-10-CM | POA: Diagnosis not present

## 2015-03-29 DIAGNOSIS — Z90710 Acquired absence of both cervix and uterus: Secondary | ICD-10-CM

## 2015-03-29 DIAGNOSIS — R451 Restlessness and agitation: Secondary | ICD-10-CM | POA: Diagnosis not present

## 2015-03-29 DIAGNOSIS — D638 Anemia in other chronic diseases classified elsewhere: Secondary | ICD-10-CM | POA: Diagnosis present

## 2015-03-29 DIAGNOSIS — J811 Chronic pulmonary edema: Secondary | ICD-10-CM

## 2015-03-29 DIAGNOSIS — R1310 Dysphagia, unspecified: Secondary | ICD-10-CM | POA: Diagnosis not present

## 2015-03-29 DIAGNOSIS — J9602 Acute respiratory failure with hypercapnia: Secondary | ICD-10-CM | POA: Diagnosis not present

## 2015-03-29 DIAGNOSIS — N186 End stage renal disease: Secondary | ICD-10-CM | POA: Diagnosis not present

## 2015-03-29 DIAGNOSIS — R579 Shock, unspecified: Secondary | ICD-10-CM | POA: Diagnosis present

## 2015-03-29 DIAGNOSIS — E875 Hyperkalemia: Secondary | ICD-10-CM | POA: Diagnosis present

## 2015-03-29 DIAGNOSIS — R918 Other nonspecific abnormal finding of lung field: Secondary | ICD-10-CM

## 2015-03-29 DIAGNOSIS — J189 Pneumonia, unspecified organism: Secondary | ICD-10-CM | POA: Diagnosis present

## 2015-03-29 DIAGNOSIS — E870 Hyperosmolality and hypernatremia: Secondary | ICD-10-CM | POA: Diagnosis present

## 2015-03-29 DIAGNOSIS — Z09 Encounter for follow-up examination after completed treatment for conditions other than malignant neoplasm: Secondary | ICD-10-CM

## 2015-03-29 DIAGNOSIS — I5042 Chronic combined systolic (congestive) and diastolic (congestive) heart failure: Secondary | ICD-10-CM | POA: Diagnosis present

## 2015-03-29 LAB — I-STAT CG4 LACTIC ACID, ED: LACTIC ACID, VENOUS: 1.36 mmol/L (ref 0.5–2.0)

## 2015-03-29 LAB — BRAIN NATRIURETIC PEPTIDE: B NATRIURETIC PEPTIDE 5: 401.8 pg/mL — AB (ref 0.0–100.0)

## 2015-03-29 LAB — I-STAT ARTERIAL BLOOD GAS, ED
ACID-BASE DEFICIT: 2 mmol/L (ref 0.0–2.0)
Acid-base deficit: 6 mmol/L — ABNORMAL HIGH (ref 0.0–2.0)
Bicarbonate: 28.6 mEq/L — ABNORMAL HIGH (ref 20.0–24.0)
Bicarbonate: 22 mEq/L (ref 20.0–24.0)
O2 SAT: 90 %
O2 Saturation: 94 %
PH ART: 7.15 — AB (ref 7.350–7.450)
PO2 ART: 78 mmHg — AB (ref 80.0–100.0)
Patient temperature: 99.2
Patient temperature: 99.2
TCO2: 31 mmol/L (ref 0–100)
TCO2: 24 mmol/L (ref 0–100)
pCO2 arterial: 82.3 mmHg (ref 35.0–45.0)
pCO2 arterial: 54.2 mmHg — ABNORMAL HIGH (ref 35.0–45.0)
pH, Arterial: 7.219 — ABNORMAL LOW (ref 7.350–7.450)
pO2, Arterial: 87 mmHg (ref 80.0–100.0)

## 2015-03-29 LAB — PROTIME-INR
INR: 1.23 (ref 0.00–1.49)
PROTHROMBIN TIME: 15.7 s — AB (ref 11.6–15.2)

## 2015-03-29 LAB — PHOSPHORUS: PHOSPHORUS: 5.5 mg/dL — AB (ref 2.5–4.6)

## 2015-03-29 LAB — COMPREHENSIVE METABOLIC PANEL
ALBUMIN: 4 g/dL (ref 3.5–5.0)
ALT: 15 U/L — ABNORMAL LOW (ref 17–63)
AST: 22 U/L (ref 15–41)
Alkaline Phosphatase: 52 U/L (ref 38–126)
Anion gap: 18 — ABNORMAL HIGH (ref 5–15)
BUN: 62 mg/dL — AB (ref 6–20)
CHLORIDE: 98 mmol/L — AB (ref 101–111)
CO2: 22 mmol/L (ref 22–32)
Calcium: 8 mg/dL — ABNORMAL LOW (ref 8.9–10.3)
Creatinine, Ser: 16.05 mg/dL — ABNORMAL HIGH (ref 0.61–1.24)
GFR calc Af Amer: 3 mL/min — ABNORMAL LOW (ref >60)
GFR calc non Af Amer: 3 mL/min — ABNORMAL LOW (ref >60)
GLUCOSE: 93 mg/dL (ref 65–99)
POTASSIUM: 5.8 mmol/L — AB (ref 3.5–5.1)
SODIUM: 138 mmol/L (ref 135–145)
Total Bilirubin: 0.7 mg/dL (ref 0.3–1.2)
Total Protein: 7.5 g/dL (ref 6.5–8.1)

## 2015-03-29 LAB — CBG MONITORING, ED: Glucose-Capillary: 92 mg/dL (ref 65–99)

## 2015-03-29 LAB — MAGNESIUM: Magnesium: 2.4 mg/dL (ref 1.7–2.4)

## 2015-03-29 LAB — I-STAT TROPONIN, ED: TROPONIN I, POC: 0.05 ng/mL (ref 0.00–0.08)

## 2015-03-29 LAB — CBC WITH DIFFERENTIAL/PLATELET
BASOS ABS: 0 10*3/uL (ref 0.0–0.1)
Basophils Relative: 0 %
Eosinophils Absolute: 0.2 10*3/uL (ref 0.0–0.7)
Eosinophils Relative: 3 %
HEMATOCRIT: 34.2 % — AB (ref 39.0–52.0)
Hemoglobin: 10.1 g/dL — ABNORMAL LOW (ref 13.0–17.0)
LYMPHS ABS: 0.9 10*3/uL (ref 0.7–4.0)
LYMPHS PCT: 14 %
MCH: 28.7 pg (ref 26.0–34.0)
MCHC: 29.5 g/dL — ABNORMAL LOW (ref 30.0–36.0)
MCV: 97.2 fL (ref 78.0–100.0)
MONO ABS: 0.4 10*3/uL (ref 0.1–1.0)
MONOS PCT: 6 %
NEUTROS ABS: 5 10*3/uL (ref 1.7–7.7)
Neutrophils Relative %: 78 %
PLATELETS: 157 10*3/uL (ref 150–400)
RBC: 3.52 MIL/uL — ABNORMAL LOW (ref 4.22–5.81)
RDW: 16.6 % — AB (ref 11.5–15.5)
WBC: 6.4 10*3/uL (ref 4.0–10.5)

## 2015-03-29 LAB — APTT: aPTT: 26 seconds (ref 24–37)

## 2015-03-29 LAB — TRIGLYCERIDES: Triglycerides: 241 mg/dL — ABNORMAL HIGH (ref <150)

## 2015-03-29 LAB — CORTISOL: CORTISOL PLASMA: 8.4 ug/dL

## 2015-03-29 LAB — GLUCOSE, CAPILLARY: Glucose-Capillary: 118 mg/dL — ABNORMAL HIGH (ref 65–99)

## 2015-03-29 LAB — LACTIC ACID, PLASMA: Lactic Acid, Venous: 1.2 mmol/L (ref 0.5–2.0)

## 2015-03-29 LAB — CULTURE, BLOOD (ROUTINE X 2): CULTURE: NO GROWTH

## 2015-03-29 LAB — MRSA PCR SCREENING: MRSA BY PCR: NEGATIVE

## 2015-03-29 LAB — PROCALCITONIN: Procalcitonin: 2.34 ng/mL

## 2015-03-29 MED ORDER — MIDAZOLAM HCL 2 MG/2ML IJ SOLN
INTRAMUSCULAR | Status: AC
  Filled 2015-03-29: qty 2

## 2015-03-29 MED ORDER — FENTANYL CITRATE (PF) 100 MCG/2ML IJ SOLN
100.0000 ug | INTRAMUSCULAR | Status: DC | PRN
  Administered 2015-03-29 – 2015-03-30 (×4): 100 ug via INTRAVENOUS
  Filled 2015-03-29 (×2): qty 2

## 2015-03-29 MED ORDER — VANCOMYCIN HCL IN DEXTROSE 1-5 GM/200ML-% IV SOLN
1000.0000 mg | Freq: Once | INTRAVENOUS | Status: AC
  Filled 2015-03-29: qty 200

## 2015-03-29 MED ORDER — PROPOFOL 1000 MG/100ML IV EMUL
0.0000 ug/kg/min | INTRAVENOUS | Status: DC
  Administered 2015-03-29: 10 ug/kg/min via INTRAVENOUS
  Administered 2015-03-29 – 2015-03-30 (×5): 50 ug/kg/min via INTRAVENOUS
  Filled 2015-03-29 (×6): qty 100

## 2015-03-29 MED ORDER — PIPERACILLIN-TAZOBACTAM 3.375 G IVPB 30 MIN
3.3750 g | Freq: Once | INTRAVENOUS | Status: AC
  Administered 2015-03-29: 3.375 g via INTRAVENOUS
  Filled 2015-03-29: qty 50

## 2015-03-29 MED ORDER — SUCCINYLCHOLINE CHLORIDE 20 MG/ML IJ SOLN
150.0000 mg | Freq: Once | INTRAMUSCULAR | Status: AC
  Administered 2015-03-29: 150 mg via INTRAVENOUS

## 2015-03-29 MED ORDER — MIDAZOLAM HCL 2 MG/2ML IJ SOLN
2.0000 mg | INTRAMUSCULAR | Status: DC | PRN
  Administered 2015-03-29 – 2015-03-30 (×4): 2 mg via INTRAVENOUS
  Filled 2015-03-29 (×2): qty 2

## 2015-03-29 MED ORDER — FENTANYL CITRATE (PF) 100 MCG/2ML IJ SOLN
INTRAMUSCULAR | Status: AC
  Administered 2015-03-29: 100 ug via INTRAVENOUS
  Filled 2015-03-29: qty 2

## 2015-03-29 MED ORDER — ROCURONIUM BROMIDE 50 MG/5ML IV SOLN
50.0000 mg | Freq: Once | INTRAVENOUS | Status: AC
  Administered 2015-03-29: 50 mg via INTRAVENOUS

## 2015-03-29 MED ORDER — IPRATROPIUM-ALBUTEROL 0.5-2.5 (3) MG/3ML IN SOLN
3.0000 mL | Freq: Four times a day (QID) | RESPIRATORY_TRACT | Status: DC
  Administered 2015-03-29 – 2015-03-31 (×6): 3 mL via RESPIRATORY_TRACT
  Filled 2015-03-29 (×8): qty 3

## 2015-03-29 MED ORDER — PANTOPRAZOLE SODIUM 40 MG IV SOLR
40.0000 mg | Freq: Every day | INTRAVENOUS | Status: DC
  Administered 2015-03-29 – 2015-03-30 (×2): 40 mg via INTRAVENOUS
  Filled 2015-03-29 (×2): qty 40

## 2015-03-29 MED ORDER — MIDAZOLAM HCL 2 MG/2ML IJ SOLN
1.0000 mg | INTRAMUSCULAR | Status: DC | PRN
  Administered 2015-03-29: 2 mg via INTRAVENOUS
  Filled 2015-03-29: qty 2

## 2015-03-29 MED ORDER — MIDAZOLAM HCL 2 MG/2ML IJ SOLN
2.0000 mg | INTRAMUSCULAR | Status: DC | PRN
Start: 2015-03-29 — End: 2015-03-31
  Administered 2015-03-29 (×2): 2 mg via INTRAVENOUS
  Filled 2015-03-29: qty 2

## 2015-03-29 MED ORDER — MIDAZOLAM HCL 2 MG/2ML IJ SOLN
INTRAMUSCULAR | Status: AC
  Administered 2015-03-29: 2 mg via INTRAVENOUS
  Filled 2015-03-29: qty 2

## 2015-03-29 MED ORDER — FENTANYL CITRATE (PF) 100 MCG/2ML IJ SOLN
100.0000 ug | INTRAMUSCULAR | Status: DC | PRN
  Administered 2015-03-29 (×2): 100 ug via INTRAVENOUS
  Filled 2015-03-29: qty 2

## 2015-03-29 MED ORDER — PIPERACILLIN-TAZOBACTAM IN DEX 2-0.25 GM/50ML IV SOLN
2.2500 g | Freq: Three times a day (TID) | INTRAVENOUS | Status: DC
  Administered 2015-03-30 – 2015-04-02 (×11): 2.25 g via INTRAVENOUS
  Filled 2015-03-29 (×17): qty 50

## 2015-03-29 MED ORDER — HEPARIN SODIUM (PORCINE) 5000 UNIT/ML IJ SOLN
5000.0000 [IU] | Freq: Three times a day (TID) | INTRAMUSCULAR | Status: DC
  Administered 2015-03-29 – 2015-04-03 (×13): 5000 [IU] via SUBCUTANEOUS
  Filled 2015-03-29 (×13): qty 1

## 2015-03-29 MED ORDER — VANCOMYCIN HCL 1000 MG IV SOLR
1500.0000 mg | Freq: Once | INTRAVENOUS | Status: AC
  Administered 2015-03-29: 1500 mg via INTRAVENOUS
  Filled 2015-03-29: qty 1500

## 2015-03-29 MED ORDER — DOXERCALCIFEROL 4 MCG/2ML IV SOLN
2.0000 ug | INTRAVENOUS | Status: DC
  Filled 2015-03-29: qty 2

## 2015-03-29 MED ORDER — ETOMIDATE 2 MG/ML IV SOLN
20.0000 mg | Freq: Once | INTRAVENOUS | Status: AC
  Administered 2015-03-29: 20 mg via INTRAVENOUS

## 2015-03-29 MED ORDER — SODIUM CHLORIDE 0.9 % IV BOLUS (SEPSIS)
1000.0000 mL | INTRAVENOUS | Status: AC
  Administered 2015-03-29 (×2): 1000 mL via INTRAVENOUS

## 2015-03-29 MED ORDER — ALBUMIN HUMAN 25 % IV SOLN
12.5000 g | Freq: Once | INTRAVENOUS | Status: AC
  Administered 2015-03-29: 12.5 g via INTRAVENOUS
  Filled 2015-03-29: qty 50

## 2015-03-29 MED ORDER — PROPOFOL 1000 MG/100ML IV EMUL
INTRAVENOUS | Status: AC
  Administered 2015-03-29: 10 ug/kg/min via INTRAVENOUS
  Filled 2015-03-29: qty 100

## 2015-03-29 MED ORDER — VANCOMYCIN HCL 10 G IV SOLR
1500.0000 mg | Freq: Once | INTRAVENOUS | Status: AC
  Administered 2015-03-29: 1500 mg via INTRAVENOUS
  Filled 2015-03-29: qty 1500

## 2015-03-29 MED ORDER — SODIUM POLYSTYRENE SULFONATE 15 GM/60ML PO SUSP
30.0000 g | Freq: Once | ORAL | Status: AC
  Administered 2015-03-29: 30 g
  Filled 2015-03-29: qty 120

## 2015-03-29 MED ORDER — FENTANYL CITRATE (PF) 100 MCG/2ML IJ SOLN
25.0000 ug | INTRAMUSCULAR | Status: DC | PRN
  Administered 2015-03-29: 100 ug via INTRAVENOUS
  Filled 2015-03-29: qty 2

## 2015-03-29 NOTE — ED Notes (Signed)
Pt arrives from home via [facility].  EMS reports pt AMS at home, initial O2 sats in 60's%, lung sounds initially diminished with rhonchi.  EMS reports pt noncompliant with home O2. EMS reports Pt given:  5 Albuterol 0.5 Atrovent 125 solu-medrol 1000 tylenol for tympanic 101.8  EMS reports pt placed on CPAP, O2 sats mid 90's%.

## 2015-03-29 NOTE — Consult Note (Signed)
[facility] Consultation Note Requesting Physician:  CCM Reason for Consult:  Provision of dialysis and ESRD related services HPI: The patient is a 55 y.o. African-American man with past medical history significant for hypertension, congestive heart failure and end-stage renal disease previously on peritoneal dialysis, then home hemodialysis and recently in-center hemodialysis at [facility] Comes in to the emergency room after getting acutely short of breath and feeling poorly. Intubated in the ED for respiratory failure. He was admitted with an identical presentation on 03/24/15 after missing dialysis.  He rec'd HD here on 03/26/15 and was discharged to home. EDW was lowered to 135 kg. HE DID NOT ATTEND HIS HD TREATMENT ON 1/7.    He is now intubated, sedated and waiting for an ICU bed. Initial ABG showed severe resp acidosis and K was 5.8.   Past Medical History  Diagnosis Date  . ESRD on hemodialysis (HCC)     Started dialysis with PD in 2013 and switched to HD in spring 2014 because of fungal peritonitis.  Takes dialysis at home 5 days a week approx 3h 15 min each session.     . CHF (congestive heart failure) (HCC)   . Peritoneal dialysis catheter in place [facility])   . Anemia     patient reporats that last hgb 7.9 a week ago  . Anxiety state, unspecified   . Hypertension      Past Surgical History  Procedure Laterality Date  . Appendectomy    . Peritoneal catheter insertion    . Tee without cardioversion N/A 07/11/2013    Procedure: TRANSESOPHAGEAL ECHOCARDIOGRAM (TEE);  Surgeon: Pricilla Riffle, MD;  Location: [facility] ENDOSCOPY;  Service: Cardiovascular;  Laterality: N/A;  . Cardioversion N/A 07/11/2013    Procedure: CARDIOVERSION;  Surgeon: Pricilla Riffle, MD;  Location: [facility] ENDOSCOPY;  Service: Cardiovascular;  Laterality: N/A;  . Knee arthroscopy Right 05/18/2014    Procedure: ARTHROSCOPIC WASHOUT OF RIGHT KNEE;  Surgeon: Sheral Apley, MD;  Location: [facility] OR;  Service: Orthopedics;   Laterality: Right;  . Av fistula placement Left 02/18/2015    Procedure: Left Brachial Cephalic ARTERIOVENOUS FISTULA CREATION;  Surgeon: Sherren Kerns, MD;  Location: [facility] OR;  Service: Vascular;  Laterality: Left;  . Insertion of dialysis catheter Right 02/18/2015    Procedure: INSERTION OF DIALYSIS CATHETER Right Internal Jugular;  Surgeon: Sherren Kerns, MD;  Location: [facility] OR;  Service: Vascular;  Laterality: Right;     Family History  Problem Relation Age of Onset  . Diabetes Mother   . Hypertension Mother   . Hypertension Sister   . Asthma Sister    Social History:  reports that he has never smoked. He has never used smokeless tobacco. He reports that he does not drink alcohol or use illicit drugs.   Allergies  Allergen Reactions  . Renvela [Sevelamer] Nausea And Vomiting     Prior to Admission medications   Medication Sig Start Date End Date Taking? Authorizing Provider  albuterol (PROVENTIL HFA;VENTOLIN HFA) 108 (90 BASE) MCG/ACT inhaler Inhale 1 puff into the lungs every 6 (six) hours as needed for wheezing or shortness of breath.   Yes Historical Provider, MD  amitriptyline (ELAVIL) 25 MG tablet Take 25 mg by mouth at bedtime. Reported on 03/12/2015   Yes Historical Provider, MD  lanthanum (FOSRENOL) 1000 MG chewable tablet Chew 2 tablets (2,000 mg total) by mouth 3 (three) times daily with meals. 02/05/15  Yes Drema Dallas, MD  metoCLOPramide (REGLAN) 10 MG tablet  Take 10 mg by mouth daily. 05/03/14  Yes Historical Provider, MD  multivitamin (RENA-VIT) TABS tablet Take 1 tablet by mouth at bedtime. 05/21/14  Yes [name], DO  Omega-3 1000 MG CAPS Take 1 g by mouth 2 (two) times daily.   Yes Historical Provider, MD  omeprazole (PRILOSEC) 20 MG capsule Take 20 mg by mouth daily. 02/10/15  Yes Historical Provider, MD  vitamin E 400 UNIT capsule Take 400 Units by mouth daily.   Yes Historical Provider, MD  clonazePAM (KLONOPIN) 0.5 MG tablet Take 0.5 tablets (0.25 mg  total) by mouth 2 (two) times daily as needed for anxiety. 03/27/15   Rolly Salter, MD  OXYGEN 2 lpm 24/7 [facility]    Historical Provider, MD  traMADol (ULTRAM) 50 MG tablet Take 1 tablet (50 mg total) by mouth every 12 (twelve) hours as needed for severe pain. 03/27/15   Rolly Salter, MD    Inpatient medications: . heparin  5,000 Units Subcutaneous 3 times per day  . ipratropium-albuterol  3 mL Nebulization Q6H  . pantoprazole (PROTONIX) IV  40 mg Intravenous QHS    Review of Systems Unobtainable as pt is intubated and sedated   Physical Exam:  BP 130/97 mmHg  Pulse 77  Temp(Src) 99.2 F (37.3 C) (Axillary)  Resp 19  Wt 133.414 kg (294 lb 2 oz)  SpO2 100%  Gen: Large framed AAM intubated in the ED - pink frothy sputum in ETT Skin: no rash, cyanosis Neck: +JVD to jaw angle Chest: Coarse crackles R TDC with dirty dressing in place Heart: Regular S1S2 No S3 Abdomen: soft, obese, non-distended Ext: Minimal if any LE edema Neuro: alert, Ox3, no focal deficit Heme/Lymph: no bruising or LAN Labs: Basic Metabolic Panel:  Recent Labs Lab 03/24/15 1200 03/24/15 1247 03/25/15 0500 03/26/15 0420 03/27/15 0750 03/29/15 1205  NA 143 142 139 140 140 138  K 4.1 4.1 2.7* 4.0 4.7 5.8*  CL 101 102 97* 98* 100* 98*  CO2 26  --  24 28 30 22   GLUCOSE 158* 149* 93 83 113* 93  BUN 69* 70* 28* 26* 27* 62*  CREATININE 18.23* 16.90* 8.89* 9.00* 9.19* 16.05*  CALCIUM 7.9*  --  9.0 8.5* 8.4* 8.0*  PHOS  --   --  2.2*  --  7.8*  --     Recent Labs Lab 03/25/15 0500 03/27/15 0750 03/29/15 1205  AST  --   --  22  ALT  --   --  15*  ALKPHOS  --   --  52  BILITOT  --   --  0.7  PROT  --   --  7.5  ALBUMIN 3.4* 3.6 4.0    Recent Labs Lab 03/24/15 1256  AMMONIA 34     Recent Labs Lab 03/25/15 0500 03/26/15 0420 03/27/15 0750 03/29/15 1205  WBC 8.3 6.1 5.9 6.4  NEUTROABS  --   --  4.0 5.0  HGB 10.3* 9.7* 10.2* 10.1*  HCT 34.0* 33.9* 35.5* 34.2*  MCV 93.9 100.3* 100.9* 97.2   PLT 216 155 134* 157   Results for Gary Rivera, Gary Rivera (MRN [id number]) as of 03/29/2015 16:09  Ref. Range 03/29/2015 12:46 03/29/2015 15:13  Sample type Unknown ARTERIAL ARTERIAL  pH, Arterial Latest Ref Range: 7.350-7.450  7.150 (LL) 7.219 (L)  pCO2 arterial Latest Ref Range: 35.0-45.0 mmHg 82.3 (HH) 54.2 (H)  pO2, Arterial Latest Ref Range: 80.0-100.0 mmHg 78.0 (L) 87.0  Bicarbonate Latest Ref Range: 20.0-24.0 mEq/L 28.6 (H)  22.0  TCO2 Latest Ref Range: 0-100 mmol/L 31 24  Acid-base deficit Latest Ref Range: 0.0-2.0 mmol/L 2.0 6.0 (H)  O2 Saturation Latest Units: % 90.0 94.0  Patient temperature Unknown 99.2 F 99.2 F     Recent Labs Lab 03/26/15 1504 03/26/15 1628 03/27/15 0907 03/27/15 1142 03/29/15 1139  GLUCAP 120* 127* 110* 105* 92     Xrays/Other Studies: Dg Chest Portable 1 View  03/29/2015  CLINICAL DATA:  Shortness of breath, intubation EXAM: PORTABLE CHEST 1 VIEW COMPARISON:  03/29/2015 FINDINGS: Cardiac enlargement unchanged. Limited inspiratory effect. Bilateral lower lung zone opacity similar to prior study. Endotracheal tube tip 19 mm above the carina. Orogastric tube extends off the inferior edge of the film. Right central line stable. IMPRESSION: Lines and tubes as described above. Electronically Signed   By: Esperanza Heir M.D.   On: 03/29/2015 14:46   Dg Chest Port 1 View  03/29/2015  CLINICAL DATA:  Decreased oxygen saturation today. EXAM: PORTABLE CHEST 1 VIEW COMPARISON:  Single view of the chest 03/26/2015 and 03/25/2015. FINDINGS: Left IJ catheter has been removed since the most recent examination. Right IJ approach dialysis catheter remains in place. There is cardiomegaly without edema. Subsegmental atelectasis is seen in the right lung base. No pneumothorax or pleural effusion. IMPRESSION: No change in subsegmental atelectasis right lung base. Cardiomegaly without edema. Electronically Signed   By: Drusilla Kanner M.D.   On: 03/29/2015 13:19   Dg Chest Port 1v  Same Day  03/29/2015  CLINICAL DATA:  Central line placement. EXAM: PORTABLE CHEST 1 VIEW COMPARISON:  03/29/2015 FINDINGS: There has been interval placement of left internal jugular approach central venous catheter with tip terminating at the expected location of superior vena cava. Large dual lumen right internal jugular central venous catheter, and endotracheal tube are stable. Enteric catheter is partially visualized. The cardiac silhouette is enlarged. Mediastinal contours appear intact. There is no evidence of pneumothorax. Lung volumes are low with prominent interstitial markings. Osseous structures are without acute abnormality. Soft tissues are grossly normal. IMPRESSION: Status post placement of left internal jugular approach central venous catheter. No evidence of pneumothorax. Otherwise stable supporting apparatus. Low lung volumes with pulmonary vascular congestion. Enlarged cardiac silhouette. Electronically Signed   By: Ted Mcalpine M.D.   On: 03/29/2015 15:31   Dg Abd Portable 1v  03/29/2015  CLINICAL DATA:  55 year old male status post OG tube placement EXAM: PORTABLE ABDOMEN - 1 VIEW COMPARISON:  Concurrently obtained chest x-ray FINDINGS: The tip of the orogastric tube projects over the region of the gastric fundus. The bowel gas pattern is not obstructed. No acute osseous abnormality. IMPRESSION: The tip of the orogastric tube projects over the gastric fundus in acceptable position. Electronically Signed   By: Malachy Moan M.D.   On: 03/29/2015 14:49   Dialysis prescription Tuesday, Thursday, Saturday at [facility]. 4 hours 30 minutes, 180 dialyzer, blood flow rate 400, dialysate flow rate 800, estimated dry weight 135kg (lowered last adm), 2K/3.5 calcium dialysate, Right IJ tunneled hemodialysis catheter. Hectorol 2 g IV 3 times a week. Heparin 4200 unit bolus and Mircera 75 g IV every 2 weeks Midodrine 10 mg pre HD  Impression/Plan  1. Hypercarbic  respiratory failure: Currently intubated and on ventilator support per CCM Anticipate will be assisted by ultrafiltration with hemodialysis as well. Identical presentation to 03/24/15. (Missed HD) 2. End-stage renal disease: Hemodialysis will be done urgently for ultrafiltration and hyperkalemia-unfortunately missed his last treatment on Saturday. 3. Anemia: Hemoglobin  levels currently acceptable-monitor off ESA. 4. CKD-MBD: Monitor calcium/phosphorus and resume phosphorus binders when able to eat/and vitamin D receptor analog intravenously with dialysis 5. Hypertension/volume: Midodrine added last admission for BP.   Camille Bal,  MD [facility] [phone] pager 03/29/2015, 3:54 PM

## 2015-03-29 NOTE — ED Notes (Signed)
O2 Sats 84% on BiPAP,  RT at bedside.  Pt AOx4

## 2015-03-29 NOTE — ED Notes (Signed)
Pt set up for central line placement to be done by critical care NP Minor

## 2015-03-29 NOTE — Progress Notes (Signed)
Pt transported to 2S04 on vent, no complications.

## 2015-03-29 NOTE — ED Notes (Signed)
Pt transferred to Trauma C for intubation

## 2015-03-29 NOTE — ED Provider Notes (Signed)
INTUBATION Performed by: Thermon Leyland  Required items: required blood products, implants, devices, and special equipment available Patient identity confirmed: provided demographic data and hospital-assigned identification number Time out: Immediately prior to procedure a "time out" was called to verify the correct patient, procedure, equipment, support staff and site/side marked as required.  Indications: respiratory failure   Intubation method: Glidescope Laryngoscopy   Preoxygenation: non re-breather   Sedatives: 20 Etomidate Paralytic: 150 Succinylcholine  Tube Size: 8 cuffed   Post-procedure assessment: chest rise and ETCO2 monitor Breath sounds: equal and absent over the epigastrium Tube secured with: ETT holder Chest x-ray interpreted by radiologist and me.  Chest x-ray findings: endotracheal tube in appropriate position  Patient tolerated the procedure well with no immediate complications.    Eyvonne Mechanic, PA-C 03/29/15 1555  Margarita Grizzle, MD 03/30/15 [phone]

## 2015-03-29 NOTE — Progress Notes (Signed)
eLink Physician-Brief Progress Note Patient Name: Gary Rivera DOB: December 01, 1960 MRN: [id number]   Date of Service  03/29/2015  HPI/Events of Note  ABG - acute resp acidosis  eICU Interventions  increase RR 25     Intervention Category Major Interventions: Acid-Base disturbance - evaluation and management  Santiaga Butzin V. 03/29/2015, 4:51 PM

## 2015-03-29 NOTE — H&P (Signed)
PULMONARY / CRITICAL CARE MEDICINE   Name: Gary Rivera MRN: [id number] DOB: [birth year]    ADMISSION DATE:  03/29/2015   REFERRING MD:  edp  CHIEF COMPLAINT:  Resp failure  HISTORY OF PRESENT ILLNESS:   55 yo MO aam with ESRD.MO,Non compliance, HTN, PHTN with dirty rt i j tunnled HD cath who was dc'd 03/27/15 for same complaint of hypercarbic resp failure. He missed dialysis and was intubated in ED when he failed NIMVS. K+ is 5.8. Renal is aware. We will give kayexalate to reduce K+, admit to ICU again. Empiric abx will be continued for now.  PAST MEDICAL HISTORY :  He  has a past medical history of ESRD on hemodialysis (HCC); CHF (congestive heart failure) (HCC); Peritoneal dialysis catheter in place [facility]); Anemia; Anxiety state, unspecified; and Hypertension.  PAST SURGICAL HISTORY: He  has past surgical history that includes Appendectomy; Peritoneal catheter insertion; TEE without cardioversion (N/A, 07/11/2013); Cardioversion (N/A, 07/11/2013); Knee arthroscopy (Right, 05/18/2014); AV fistula placement (Left, 02/18/2015); and Insertion of dialysis catheter (Right, 02/18/2015).  Allergies  Allergen Reactions  . Renvela [Sevelamer] Nausea And Vomiting    No current facility-administered medications on file prior to encounter.   Current Outpatient Prescriptions on File Prior to Encounter  Medication Sig  . albuterol (PROVENTIL HFA;VENTOLIN HFA) 108 (90 BASE) MCG/ACT inhaler Inhale 1 puff into the lungs every 6 (six) hours as needed for wheezing or shortness of breath.  Marland Kitchen amitriptyline (ELAVIL) 25 MG tablet Take 25 mg by mouth at bedtime. Reported on 03/12/2015  . lanthanum (FOSRENOL) 1000 MG chewable tablet Chew 2 tablets (2,000 mg total) by mouth 3 (three) times daily with meals.  . metoCLOPramide (REGLAN) 10 MG tablet Take 10 mg by mouth daily.  . multivitamin (RENA-VIT) TABS tablet Take 1 tablet by mouth at bedtime.  . Omega-3 1000 MG CAPS Take 1 g by mouth 2 (two) times daily.   Marland Kitchen omeprazole (PRILOSEC) 20 MG capsule Take 20 mg by mouth daily.  . vitamin E 400 UNIT capsule Take 400 Units by mouth daily.  . clonazePAM (KLONOPIN) 0.5 MG tablet Take 0.5 tablets (0.25 mg total) by mouth 2 (two) times daily as needed for anxiety.  . OXYGEN 2 lpm 24/7 [facility]  . traMADol (ULTRAM) 50 MG tablet Take 1 tablet (50 mg total) by mouth every 12 (twelve) hours as needed for severe pain.    FAMILY HISTORY:  His indicated that his mother is deceased. He indicated that his sister is alive.   SOCIAL HISTORY: He  reports that he has never smoked. He has never used smokeless tobacco. He reports that he does not drink alcohol or use illicit drugs.  REVIEW OF SYSTEMS:   NA  SUBJECTIVE:  Intubated and sedated  VITAL SIGNS: BP 162/91 mmHg  Pulse 91  Temp(Src) 99.2 F (37.3 C) (Axillary)  Resp 15  Wt 294 lb 2 oz (133.414 kg)  SpO2 98%  HEMODYNAMICS:    VENTILATOR SETTINGS: Vent Mode:  [-] PRVC FiO2 (%):  [40 %-100 %] 100 % Set Rate:  [6 bmp-14 bmp] 14 bmp Vt Set:  [893 mL] 660 mL PEEP:  [5 cmH20-6 cmH20] 5 cmH20 Plateau Pressure:  [28 cmH20] 28 cmH20  INTAKE / OUTPUT:    PHYSICAL EXAMINATION: General:  MO AAM poorly sedated on vent Neuro: sedated HEENT:  OTT-> vent Cardiovascular:  HSD Lungs: Coarse rhonchi Abdomen: obese soft +bs Musculoskeletal:  intact Skin:  Left antecubital wound noted   LABS:  BMET  Recent Labs Lab  03/26/15 0420 03/27/15 0750 03/29/15 1205  NA 140 140 138  K 4.0 4.7 5.8*  CL 98* 100* 98*  CO2 28 30 22   BUN 26* 27* 62*  CREATININE 9.00* 9.19* 16.05*  GLUCOSE 83 113* 93    Electrolytes  Recent Labs Lab 03/25/15 0500 03/26/15 0420 03/27/15 0750 03/29/15 1205  CALCIUM 9.0 8.5* 8.4* 8.0*  PHOS 2.2*  --  7.8*  --     CBC  Recent Labs Lab 03/26/15 0420 03/27/15 0750 03/29/15 1205  WBC 6.1 5.9 6.4  HGB 9.7* 10.2* 10.1*  HCT 33.9* 35.5* 34.2*  PLT 155 134* 157    Coag's No results for input(s): APTT, INR in  the last 168 hours.  Sepsis Markers  Recent Labs Lab 03/24/15 1253 03/24/15 2040 03/29/15 1231  LATICACIDVEN 1.61  --  1.36  PROCALCITON  --  1.59  --     ABG  Recent Labs Lab 03/25/15 0339 03/29/15 1246  PHART 7.617* 7.150*  PCO2ART 25.1* 82.3*  PO2ART 64.0* 78.0*    Liver Enzymes  Recent Labs Lab 03/25/15 0500 03/27/15 0750 03/29/15 1205  AST  --   --  22  ALT  --   --  15*  ALKPHOS  --   --  52  BILITOT  --   --  0.7  ALBUMIN 3.4* 3.6 4.0    Cardiac Enzymes No results for input(s): TROPONINI, PROBNP in the last 168 hours.  Glucose  Recent Labs Lab 03/26/15 0759 03/26/15 1504 03/26/15 1628 03/27/15 0907 03/27/15 1142 03/29/15 1139  GLUCAP 82 120* 127* 110* 105* 92    Imaging Dg Chest Port 1 View  03/29/2015  CLINICAL DATA:  Decreased oxygen saturation today. EXAM: PORTABLE CHEST 1 VIEW COMPARISON:  Single view of the chest 03/26/2015 and 03/25/2015. FINDINGS: Left IJ catheter has been removed since the most recent examination. Right IJ approach dialysis catheter remains in place. There is cardiomegaly without edema. Subsegmental atelectasis is seen in the right lung base. No pneumothorax or pleural effusion. IMPRESSION: No change in subsegmental atelectasis right lung base. Cardiomegaly without edema. Electronically Signed   By: Drusilla Kanner M.D.   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 No apparent infection Start empiric abx 1/8 v/z  ENDOCRINE A:   No acute issue P:     NEUROLOGIC A:   Lethargic , hypercarbic P:   RASS goal: -1 Intubated and poorly sedated, will increase sedation for tube tolerance   FAMILY  - Updates:  None at bedside  - Inter-disciplinary family meet or Palliative Care meeting due by:  day 7    [facility] Auther Lyerly ACNP Adolph Pollack PCCM Pager [phone] till 3 pm If no answer page [phone] 03/29/2015, 2:09 PM

## 2015-03-29 NOTE — ED Notes (Signed)
Dr. Rosalia Hammers made aware of BP 86/41.

## 2015-03-29 NOTE — Procedures (Signed)
Central Venous Catheter Insertion Procedure Note Gary Rivera [id number] [birth year]  Procedure: Insertion of Central Venous Catheter Indications: Assessment of intravascular volume, Drug and/or fluid administration and Frequent blood sampling  Procedure Details Consent: Unable to obtain consent because of emergent medical necessity. Time Out: Verified patient identification, verified procedure, site/side was marked, verified correct patient position, special equipment/implants available, medications/allergies/relevent history reviewed, required imaging and test results available.  Performed  Maximum sterile technique was used including antiseptics, cap, gloves, gown, hand hygiene, mask and sheet. Skin prep: Chlorhexidine; local anesthetic administered A antimicrobial bonded/coated triple lumen catheter was placed in the left internal jugular vein using the Seldinger technique. Ultrasound guidance used.Yes.   Catheter placed to 20 cm. Blood aspirated via all 3 ports and then flushed x 3. Line sutured x 2 and dressing applied.  Evaluation Blood flow good Complications: No apparent complications Patient did tolerate procedure well. Chest X-ray ordered to verify placement.  CXR: pending.  [name] Gaylan Fauver ACNP Adolph Pollack PCCM Pager [phone] till 3 pm If no answer page [phone] 03/29/2015, 3:02 PM

## 2015-03-29 NOTE — Progress Notes (Addendum)
ANTIBIOTIC CONSULT NOTE - INITIAL  Pharmacy Consult for vancomycin and Zosyn Indication: rule out sepsis  Allergies  Allergen Reactions  . Renvela [Sevelamer] Nausea And Vomiting    Patient Measurements:     Vital Signs: Temp: 99.2 F (37.3 C) (01/08 1137) Temp Source: Axillary (01/08 1137) BP: 103/60 mmHg (01/08 1138) Pulse Rate: 90 (01/08 1138) Intake/Output from previous day:   Intake/Output from this shift:    Labs:  Recent Labs  03/27/15 0750  WBC 5.9  HGB 10.2*  PLT 134*  CREATININE 9.19*   Estimated Creatinine Clearance: 13.2 mL/min (by C-G formula based on Cr of 9.19). No results for input(s): VANCOTROUGH, VANCOPEAK, VANCORANDOM, GENTTROUGH, GENTPEAK, GENTRANDOM, TOBRATROUGH, TOBRAPEAK, TOBRARND, AMIKACINPEAK, AMIKACINTROU, AMIKACIN in the last 72 hours.   Microbiology: Recent Results (from the past 720 hour(s))  Blood culture (routine x 2)     Status: None (Preliminary result)   Collection Time: 03/24/15 12:52 PM  Result Value Ref Range Status   Specimen Description BLOOD LEFT ANTECUBITAL  Final   Special Requests IN PEDIATRIC BOTTLE 1CCS  Final   Culture NO GROWTH 4 DAYS  Final   Report Status PENDING  Incomplete  Blood culture (routine x 2)     Status: None   Collection Time: 03/24/15  1:21 PM  Result Value Ref Range Status   Specimen Description BLOOD RIGHT HAND  Final   Special Requests BOTTLES DRAWN AEROBIC AND ANAEROBIC 5CC  Final   Culture NO GROWTH 5 DAYS  Final   Report Status 03/29/2015 FINAL  Final  MRSA PCR Screening     Status: None   Collection Time: 03/24/15  5:20 PM  Result Value Ref Range Status   MRSA by PCR NEGATIVE NEGATIVE Final    Comment:        The GeneXpert MRSA Assay (FDA approved for NASAL specimens only), is one component of a comprehensive MRSA colonization surveillance program. It is not intended to diagnose MRSA infection nor to guide or monitor treatment for MRSA infections.    Assessment: 57 YOM who was  discharged on 1/6 now readmitted with AMS and low O2 sats, rhonchi with diminished lung sounds.  Per EMS, non-compliance with home O2.  ESRD patient on HD TTSat. Per renal notes from day of discharge, patient had transportation to HD on Saturday 1/7.   Vancomycin 1g IV x1 and Zosyn 3.375g IV x1 ordered in the ED. Blood and urine cultures ordered.  Goal of Therapy:  pre-HD vancomycin level 15-19mcg/mL  Plan:  -give ADDITIONAL vancomycin 1500mg  IV x1 in the ED to complete a full loading dose -Zosyn 2.25g IV q8h -follow HD plan and will schedule vancomycin as appropriate -follow c/s, clinical progression, HD schedule/tolerance  Mourad Cwikla D. [place], PharmD, BCPS Clinical Pharmacist Pager: [phone] 03/29/2015 12:07 PM  ADDENDUM Following up on patient. Noted that RN only gave vancomycin 1500mg  IV x1 in the ED and not 1500mg  + 1g of vancomycin as ordered (see above). To go urgently for HD. Noted in Dr. Elza Rafter note that patient did not go to HD session on 1/7 as planned.  Plan: -given vancomycin 1500mg  IV x1 after HD session tonight- estimate this will bring pre-HD level up to ~10mcg/mL which is in therapeutic range -Continue Zosyn as above- 2.25g IV q8h -follow HD schedule/tolerance -follow c/s, clinical progression  Etsuko Dierolf D. Jaymien Landin, PharmD, BCPS Clinical Pharmacist Pager: [phone] 03/29/2015 4:50 PM

## 2015-03-29 NOTE — ED Notes (Signed)
Drt. Ray MD and RT  at bedside.

## 2015-03-29 NOTE — ED Provider Notes (Signed)
CSN: [id number]     Arrival date & time 03/29/15  1125 History   First MD Initiated Contact with Patient 03/29/15 1128     Chief Complaint  Patient presents with  . Shortness of Breath  . Altered Mental Status    Level V caveat secondary to patient having altered mental status and acute need for intervention (Consider location/radiation/quality/duration/timing/severity/associated sxs/prior Treatment) HPI This is a 55 year old man on dialysis Monday Wednesday Friday who called EMS today. EMS reports that they arrived to find his house in disarray and the patient confused. She appeared dyspneic with low oxygen saturations and hypotension on their evaluation. They state that he was intermittently able to answer some questions, but was unable to tell them what had occurred. They report that there were no empty pill bottles noted, no sedating substances such as medications, alcohol, or illicit drugs. He lives alone and noticed no one else present to give history. Review of medical records reveal recent hospitalization with intubation secondary to hypercarbic respiratory failure  he was last discharged 2 days ago. Past Medical History  Diagnosis Date  . ESRD on hemodialysis (HCC)     Started dialysis with PD in 2013 and switched to HD in spring 2014 because of fungal peritonitis.  Takes dialysis at home 5 days a week approx 3h 15 min each session.     . CHF (congestive heart failure) (HCC)   . Peritoneal dialysis catheter in place [facility])   . Anemia     patient reporats that last hgb 7.9 a week ago  . Anxiety state, unspecified   . Hypertension    Past Surgical History  Procedure Laterality Date  . Appendectomy    . Peritoneal catheter insertion    . Tee without cardioversion N/A 07/11/2013    Procedure: TRANSESOPHAGEAL ECHOCARDIOGRAM (TEE);  Surgeon: Pricilla Riffle, MD;  Location: [facility] ENDOSCOPY;  Service: Cardiovascular;  Laterality: N/A;  . Cardioversion N/A 07/11/2013    Procedure: CARDIOVERSION;   Surgeon: Pricilla Riffle, MD;  Location: [facility] ENDOSCOPY;  Service: Cardiovascular;  Laterality: N/A;  . Knee arthroscopy Right 05/18/2014    Procedure: ARTHROSCOPIC WASHOUT OF RIGHT KNEE;  Surgeon: Sheral Apley, MD;  Location: [facility] OR;  Service: Orthopedics;  Laterality: Right;  . Av fistula placement Left 02/18/2015    Procedure: Left Brachial Cephalic ARTERIOVENOUS FISTULA CREATION;  Surgeon: Sherren Kerns, MD;  Location: [facility] OR;  Service: Vascular;  Laterality: Left;  . Insertion of dialysis catheter Right 02/18/2015    Procedure: INSERTION OF DIALYSIS CATHETER Right Internal Jugular;  Surgeon: Sherren Kerns, MD;  Location: [facility] OR;  Service: Vascular;  Laterality: Right;   Family History  Problem Relation Age of Onset  . Diabetes Mother   . Hypertension Mother   . Hypertension Sister   . Asthma Sister    Social History  Substance Use Topics  . Smoking status: Never Smoker   . Smokeless tobacco: Never Used  . Alcohol Use: No    Review of Systems  Unable to perform ROS: Mental status change      Allergies  Renvela  Home Medications   Prior to Admission medications   Medication Sig Start Date End Date Taking? Authorizing Provider  albuterol (PROVENTIL HFA;VENTOLIN HFA) 108 (90 BASE) MCG/ACT inhaler Inhale 1 puff into the lungs every 6 (six) hours as needed for wheezing or shortness of breath.   Yes Historical Provider, MD  amitriptyline (ELAVIL) 25 MG tablet Take 25 mg by mouth at bedtime. Reported on  03/12/2015   Yes Historical Provider, MD  lanthanum (FOSRENOL) 1000 MG chewable tablet Chew 2 tablets (2,000 mg total) by mouth 3 (three) times daily with meals. 02/05/15  Yes Drema Dallas, MD  metoCLOPramide (REGLAN) 10 MG tablet Take 10 mg by mouth daily. 05/03/14  Yes Historical Provider, MD  multivitamin (RENA-VIT) TABS tablet Take 1 tablet by mouth at bedtime. 05/21/14  Yes [name], DO  Omega-3 1000 MG CAPS Take 1 g by mouth 2 (two) times daily.   Yes Historical  Provider, MD  omeprazole (PRILOSEC) 20 MG capsule Take 20 mg by mouth daily. 02/10/15  Yes Historical Provider, MD  vitamin E 400 UNIT capsule Take 400 Units by mouth daily.   Yes Historical Provider, MD  clonazePAM (KLONOPIN) 0.5 MG tablet Take 0.5 tablets (0.25 mg total) by mouth 2 (two) times daily as needed for anxiety. 03/27/15   Rolly Salter, MD  OXYGEN 2 lpm 24/7 [facility]    Historical Provider, MD  traMADol (ULTRAM) 50 MG tablet Take 1 tablet (50 mg total) by mouth every 12 (twelve) hours as needed for severe pain. 03/27/15   Rolly Salter, MD   BP 96/65 mmHg  Pulse 84  Temp(Src) 99.2 F (37.3 C) (Axillary)  Resp 18  Wt 133.414 kg  SpO2 96% Physical Exam  Constitutional: He appears well-developed and well-nourished.  Morbidly obese on BiPAP  HENT:  Head: Normocephalic.  Right Ear: External ear normal.  Left Ear: External ear normal.  Nose: Nose normal.  Mucous membranes are somewhat dry  Eyes: Conjunctivae are normal. Pupils are equal, round, and reactive to light.  Neck: Normal range of motion.  Cardiovascular: Normal rate and regular rhythm.   Pulmonary/Chest: Effort normal.  Decreased breath sounds throughout  Abdominal: Soft. Bowel sounds are normal. He exhibits distension.  Musculoskeletal: Normal range of motion. He exhibits edema.  Neurological: He is alert. No cranial nerve deficit.  Patient unable to answer questions related to location or date  Skin: Skin is warm.  Nursing note and vitals reviewed.   ED Course  Procedures (including critical care time) Labs Review Labs Reviewed  COMPREHENSIVE METABOLIC PANEL - Abnormal; Notable for the following:    Potassium 5.8 (*)    Chloride 98 (*)    BUN 62 (*)    Creatinine, Ser 16.05 (*)    Calcium 8.0 (*)    ALT 15 (*)    GFR calc non Af Amer 3 (*)    GFR calc Af Amer 3 (*)    Anion gap 18 (*)    All other components within normal limits  CBC WITH DIFFERENTIAL/PLATELET - Abnormal; Notable for the following:     RBC 3.52 (*)    Hemoglobin 10.1 (*)    HCT 34.2 (*)    MCHC 29.5 (*)    RDW 16.6 (*)    All other components within normal limits  I-STAT ARTERIAL BLOOD GAS, ED - Abnormal; Notable for the following:    pH, Arterial 7.150 (*)    pCO2 arterial 82.3 (*)    pO2, Arterial 78.0 (*)    Bicarbonate 28.6 (*)    All other components within normal limits  CULTURE, BLOOD (ROUTINE X 2)  CULTURE, BLOOD (ROUTINE X 2)  URINE CULTURE  URINALYSIS, ROUTINE W REFLEX MICROSCOPIC (NOT AT [facility])  TRIGLYCERIDES  CBG MONITORING, ED  I-STAT CG4 LACTIC ACID, ED  I-STAT CG4 LACTIC ACID, ED  Rosezena Sensor, ED    Imaging Review Dg Chest [facility]  1 View  03/29/2015  CLINICAL DATA:  Decreased oxygen saturation today. EXAM: PORTABLE CHEST 1 VIEW COMPARISON:  Single view of the chest 03/26/2015 and 03/25/2015. FINDINGS: Left IJ catheter has been removed since the most recent examination. Right IJ approach dialysis catheter remains in place. There is cardiomegaly without edema. Subsegmental atelectasis is seen in the right lung base. No pneumothorax or pleural effusion. IMPRESSION: No change in subsegmental atelectasis right lung base. Cardiomegaly without edema. Electronically Signed   By: Drusilla Kanner M.D.   On: 03/29/2015 13:19   I have personally reviewed and evaluated these images and lab results as part of my medical decision-making.   EKG Interpretation None      MDM      1-hypercarbic respiratory failure patient presented on BiPAP and has had increased lethargy. Patient intubated here in department seeing intubation note  post intubation chest x-Ajla Mcgeachy and repeat ABG pending 2 renal failure patient on dialysis reportedly Tuesday Thursday Saturday. It was reported that he did have dialysis yesterday but is unclear where this information came from 3 hypokalemia can vary to #2  CRITICAL CARE Performed by: Martrice Apt S Total critical care time: 75 minutes Critical care time was exclusive of  separately billable procedures and treating other patients. Critical care was necessary to treat or prevent imminent or life-threatening deterioration. Critical care was time spent personally by me on the following activities: development of treatment plan with patient and/or surrogate as well as nursing, discussions with consultants, evaluation of patient's response to treatment, examination of patient, obtaining history from patient or surrogate, ordering and performing treatments and interventions, ordering and review of laboratory studies, ordering and review of radiographic studies, pulse oximetry and re-evaluation of patient's condition.   Margarita Grizzle, MD 03/29/15 [phone]

## 2015-03-30 ENCOUNTER — Inpatient Hospital Stay (HOSPITAL_COMMUNITY): Payer: Medicare Other

## 2015-03-30 ENCOUNTER — Encounter (HOSPITAL_COMMUNITY): Payer: Self-pay | Admitting: General Practice

## 2015-03-30 DIAGNOSIS — J9622 Acute and chronic respiratory failure with hypercapnia: Secondary | ICD-10-CM

## 2015-03-30 DIAGNOSIS — J9621 Acute and chronic respiratory failure with hypoxia: Principal | ICD-10-CM

## 2015-03-30 LAB — RENAL FUNCTION PANEL
ALBUMIN: 3.5 g/dL (ref 3.5–5.0)
Anion gap: 19 — ABNORMAL HIGH (ref 5–15)
BUN: 22 mg/dL — AB (ref 6–20)
CHLORIDE: 98 mmol/L — AB (ref 101–111)
CO2: 20 mmol/L — AB (ref 22–32)
Calcium: 9.8 mg/dL (ref 8.9–10.3)
Creatinine, Ser: 7.09 mg/dL — ABNORMAL HIGH (ref 0.61–1.24)
GFR calc Af Amer: 9 mL/min — ABNORMAL LOW (ref >60)
GFR calc non Af Amer: 8 mL/min — ABNORMAL LOW (ref >60)
GLUCOSE: 87 mg/dL (ref 65–99)
POTASSIUM: 3.3 mmol/L — AB (ref 3.5–5.1)
Phosphorus: 3.6 mg/dL (ref 2.5–4.6)
Sodium: 137 mmol/L (ref 135–145)

## 2015-03-30 LAB — BLOOD GAS, ARTERIAL
ACID-BASE DEFICIT: 1.8 mmol/L (ref 0.0–2.0)
BICARBONATE: 20.4 meq/L (ref 20.0–24.0)
Drawn by: 36277
FIO2: 0.5
LHR: 25 {breaths}/min
O2 SAT: 91.1 %
PATIENT TEMPERATURE: 97.6
PCO2 ART: 22.8 mmHg — AB (ref 35.0–45.0)
PEEP/CPAP: 5 cmH2O
PH ART: 7.558 — AB (ref 7.350–7.450)
TCO2: 21.1 mmol/L (ref 0–100)
VT: 660 mL
pO2, Arterial: 54.2 mmHg — ABNORMAL LOW (ref 80.0–100.0)

## 2015-03-30 LAB — CULTURE, BLOOD (ROUTINE X 2): Culture: NO GROWTH

## 2015-03-30 LAB — CBC
HEMATOCRIT: 29.1 % — AB (ref 39.0–52.0)
HEMOGLOBIN: 9 g/dL — AB (ref 13.0–17.0)
MCH: 28.5 pg (ref 26.0–34.0)
MCHC: 30.9 g/dL (ref 30.0–36.0)
MCV: 92.1 fL (ref 78.0–100.0)
Platelets: 130 10*3/uL — ABNORMAL LOW (ref 150–400)
RBC: 3.16 MIL/uL — ABNORMAL LOW (ref 4.22–5.81)
RDW: 16.4 % — ABNORMAL HIGH (ref 11.5–15.5)
WBC: 5.1 10*3/uL (ref 4.0–10.5)

## 2015-03-30 MED ORDER — CETYLPYRIDINIUM CHLORIDE 0.05 % MT LIQD
7.0000 mL | Freq: Two times a day (BID) | OROMUCOSAL | Status: DC
  Administered 2015-03-31: 7 mL via OROMUCOSAL

## 2015-03-30 MED ORDER — ANTISEPTIC ORAL RINSE SOLUTION (CORINZ): 7.0000 mL | Freq: Four times a day (QID) | OROMUCOSAL | Status: DC

## 2015-03-30 MED ORDER — ENSURE ENLIVE PO LIQD: 237.0000 mL | Freq: Two times a day (BID) | ORAL | Status: DC

## 2015-03-30 MED ORDER — NEPRO/CARBSTEADY PO LIQD
237.0000 mL | Freq: Two times a day (BID) | ORAL | Status: DC
  Filled 2015-03-30 (×5): qty 237

## 2015-03-30 MED ORDER — CHLORHEXIDINE GLUCONATE 0.12% ORAL RINSE (MEDLINE KIT)
15.0000 mL | Freq: Two times a day (BID) | OROMUCOSAL | Status: DC
  Administered 2015-03-30: 15 mL via OROMUCOSAL

## 2015-03-30 MED ORDER — INFLUENZA VAC SPLIT QUAD 0.5 ML IM SUSY
0.5000 mL | PREFILLED_SYRINGE | INTRAMUSCULAR | Status: AC
  Administered 2015-03-31: 0.5 mL via INTRAMUSCULAR
  Filled 2015-03-30 (×2): qty 0.5

## 2015-03-30 MED ORDER — VANCOMYCIN HCL IN DEXTROSE 1-5 GM/200ML-% IV SOLN
1000.0000 mg | INTRAVENOUS | Status: DC
  Administered 2015-03-31: 1000 mg via INTRAVENOUS
  Filled 2015-03-30 (×3): qty 200

## 2015-03-30 NOTE — Progress Notes (Signed)
Pharmacy Antibiotic Follow-up Note  Gary Rivera is a 55 y.o. year-old male admitted on 03/29/2015.  The patient is currently on day 2 of vancomycin/zosyn for r/o sepsis (noted with left antecubital wound). He is noted for HD on 03/30/14 -SCr= 7.09 (ESRD s/p HD 1/8; BFR 400, ~ 4hrs) -WBC= 5.1, afebrile  Plan: -Continue vancomycin 1000mg  IV after HD on TTS -Will follow renal function, cultures and clinical progress    Temp (24hrs), Avg:97.5 F (36.4 C), Min:96.1 F (35.6 C), Max:98.4 F (36.9 C)   Recent Labs Lab 03/25/15 0500 03/26/15 0420 03/27/15 0750 03/29/15 1205 03/30/15 0347  WBC 8.3 6.1 5.9 6.4 5.1    Recent Labs Lab 03/25/15 0500 03/26/15 0420 03/27/15 0750 03/29/15 1205 03/30/15 0347  CREATININE 8.89* 9.00* 9.19* 16.05* 7.09*   Estimated Creatinine Clearance: 17.3 mL/min (by C-G formula based on Cr of 7.09).    Allergies  Allergen Reactions  . Renvela [Sevelamer] Nausea And Vomiting    Antimicrobials this admission: 1/8 zosyn 1/8 vanc  Levels/dose changes this admission: n/a  Microbiology results: 03/28/14 MRSA PCR - neg 1/8 blood x2  Thank you for allowing pharmacy to be a part of this patient's care.  Benny Lennert PharmD 03/30/2015 12:55 PM

## 2015-03-30 NOTE — Progress Notes (Signed)
Utilization Review Completed.[name] T1/11/2015  

## 2015-03-30 NOTE — Progress Notes (Signed)
  [facility] Progress Note   Subjective: no complaints, wonders if he has PNA, was coughing 'green ' up he writes. 2kg off last night w soft BP's  Filed Vitals:   03/30/15 0900 03/30/15 0930 03/30/15 1000 03/30/15 1030  BP: 99/59 106/68 127/69 115/69  Pulse: 62 63 81 74  Temp:      TempSrc:      Resp: 21 21 24 19   Weight:      SpO2: 97% 98% 96% 97%    Inpatient medications: . antiseptic oral rinse  7 mL Mouth Rinse QID  . chlorhexidine gluconate  15 mL Mouth Rinse BID  . [START ON 03/31/2015] doxercalciferol  2 mcg Intravenous Q T,Th,Sa-HD  . heparin  5,000 Units Subcutaneous 3 times per day  . ipratropium-albuterol  3 mL Nebulization Q6H  . pantoprazole (PROTONIX) IV  40 mg Intravenous QHS  . piperacillin-tazobactam (ZOSYN)  IV  2.25 g Intravenous 3 times per day   . propofol (DIPRIVAN) infusion 15 mcg/kg/min (03/30/15 0900)   fentaNYL (SUBLIMAZE) injection, fentaNYL (SUBLIMAZE) injection, fentaNYL (SUBLIMAZE) injection, midazolam, midazolam, midazolam  Exam: Alert on vent, respnosive No jvd Chest clear ant/ lat RRR no mrg Abd soft obese ntnd +bs No LE or UE edema R chest tunneled hd cath, clean exit site Neuro is nf, ox   CXR vasc congestion, atx no overt edema ABG 1/817  7.15/ 82/ 78 / 90% SaO2 [place]   4.5h   135kg   2/3.5 bath  R IJ cath   Hep 4200 Hect 2 ug Mircera 75 q 2wks Midodrine 10 tiw pre HD      Assessment: 1 Acute on chron resp failure - hypercarbic, not vol issue at this time. Is f/b pulm OP setting for VCD and chronic DOE felt to be due to obesity and/or chron vol overload, on home O2 2 ESRD HD usual is TTS, had HD last night 3 Chron hypotens on midodrine pre HD 4 MBD binders 5 Volume no excess vol on exam/ CXR 6 GERD on reglan 7 Morbid obesity 8 Depression on elavil/ Klonopin 9 Anemia cont esa prn  Plan - HD tomorrow   Gary Rivera [facility] pager [phone]    cell [phone] 03/30/2015, 10:59 AM     Recent Labs Lab 03/27/15 0750 03/29/15 1205 03/29/15 1745 03/30/15 0347  NA 140 138  --  137  K 4.7 5.8*  --  3.3*  CL 100* 98*  --  98*  CO2 30 22  --  20*  GLUCOSE 113* 93  --  87  BUN 27* 62*  --  22*  CREATININE 9.19* 16.05*  --  7.09*  CALCIUM 8.4* 8.0*  --  9.8  PHOS 7.8*  --  5.5* 3.6    Recent Labs Lab 03/27/15 0750 03/29/15 1205 03/30/15 0347  AST  --  22  --   ALT  --  15*  --   ALKPHOS  --  52  --   BILITOT  --  0.7  --   PROT  --  7.5  --   ALBUMIN 3.6 4.0 3.5    Recent Labs Lab 03/27/15 0750 03/29/15 1205 03/30/15 0347  WBC 5.9 6.4 5.1  NEUTROABS 4.0 5.0  --   HGB 10.2* 10.1* 9.0*  HCT 35.5* 34.2* 29.1*  MCV 100.9* 97.2 92.1  PLT 134* 157 130*

## 2015-03-30 NOTE — Procedures (Signed)
Extubation Procedure Note  Patient Details:   Name: Gary Rivera DOB: Jul 01, 1960 MRN: [id number]   Airway Documentation:     Evaluation  O2 sats: stable throughout Complications: No apparent complications Patient did tolerate procedure well. Bilateral Breath Sounds: Clear, Diminished   Yes   Patient was extubated to a 4L [place]. RN was at bedside with RT during extubation. Cuff leak was heard. No stridor was noted. RT will continue to monitor.  Danella Maiers Shadow Stiggers 03/30/2015, 11:19 AM

## 2015-03-30 NOTE — Progress Notes (Signed)
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 He missed dialysis and was intubated in ED when he failed NIMVS. K+ is 5.8. Renal is aware. We will give kayexalate to reduce K+, admit to ICU again. Empiric abx will be continued for now.   SUBJECTIVE:  Underwent HD last night, 2L removed FiO2 up to 0.60 this am  resp alk this am   VITAL SIGNS: BP 86/57 mmHg  Pulse 61  Temp(Src) 97.1 F (36.2 C) (Axillary)  Resp 25  Wt 136.1 kg (300 lb 0.7 oz)  SpO2 99%  HEMODYNAMICS: CVP:  [4 mmHg-22 mmHg] 10 mmHg  VENTILATOR SETTINGS: Vent Mode:  [-] PRVC FiO2 (%):  [40 %-100 %] 50 % Set Rate:  [6 bmp-25 bmp] 25 bmp Vt Set:  [841 mL] 660 mL PEEP:  [5 cmH20-6 cmH20] 5 cmH20 Plateau Pressure:  [22 cmH20-28 cmH20] 22 cmH20  INTAKE / OUTPUT: I/O last 3 completed shifts: In: 2694.3 [I.V.:2694.3] Out: 2000 [Other:2000]  PHYSICAL EXAMINATION: General:  MO AAM on vent Neuro: sedated but wakes to voice HEENT:  OTT-> vent Cardiovascular:  HSD Lungs: Coarse rhonchi Abdomen: obese soft +bs Musculoskeletal:  intact Skin:  Left antecubital wound noted   LABS:  BMET  Recent Labs Lab 03/27/15 0750 03/29/15 1205 03/30/15 0347  NA 140 138 137  K 4.7 5.8* 3.3*  CL 100* 98* 98*  CO2 30 22 20*  BUN 27* 62* 22*  CREATININE 9.19* 16.05* 7.09*  GLUCOSE 113* 93 87    Electrolytes  Recent Labs Lab 03/27/15 0750 03/29/15 1205 03/29/15 1745 03/30/15 0347  CALCIUM 8.4* 8.0*  --  9.8  MG  --   --  2.4  --   PHOS 7.8*  --  5.5* 3.6    CBC  Recent Labs Lab 03/27/15 0750 03/29/15 1205 03/30/15 0347  WBC 5.9 6.4 5.1  HGB 10.2* 10.1* 9.0*  HCT 35.5* 34.2* 29.1*  PLT  134* 157 130*    Coag's  Recent Labs Lab 03/29/15 1745  APTT 26  INR 1.23    Sepsis Markers  Recent Labs Lab 03/24/15 1253 03/24/15 2040 03/29/15 1231 03/29/15 1745  LATICACIDVEN 1.61  --  1.36 1.2  PROCALCITON  --  1.59  --  2.34    ABG  Recent Labs Lab 03/29/15 1246 03/29/15 1513 03/30/15 0355  PHART 7.150* 7.219* 7.558*  PCO2ART 82.3* 54.2* 22.8*  PO2ART 78.0* 87.0 54.2*    Liver Enzymes  Recent Labs Lab 03/27/15 0750 03/29/15 1205 03/30/15 0347  AST  --  22  --   ALT  --  15*  --   ALKPHOS  --  52  --   BILITOT  --  0.7  --   ALBUMIN 3.6 4.0 3.5    Cardiac Enzymes No results for input(s): TROPONINI, PROBNP in the last 168 hours.  Glucose  Recent Labs Lab 03/26/15 1504 03/26/15 1628 03/27/15 0907 03/27/15 1142 03/29/15 1139 03/29/15 1931  GLUCAP 120* 127* 110* 105* 92 118*    Imaging Dg Chest Port 1 View  03/30/2015  CLINICAL DATA:  Shortness of breath after dialysis. EXAM: PORTABLE CHEST 1 VIEW COMPARISON:  03/29/2015  FINDINGS: The heart is enlarged but stable. Persistent central vascular congestion and streaky areas of atelectasis but no overt pulmonary edema. No definite pleural effusions. The support apparatus is stable. IMPRESSION: Stable support apparatus. Stable cardiac enlargement, vascular congestion and streaky areas of atelectasis but no overt pulmonary edema or definite pleural effusions. Electronically Signed   By: Marijo Sanes M.D.   On: 03/30/2015 08:05   Dg Chest Portable 1 View  03/29/2015  CLINICAL DATA:  Shortness of breath, intubation EXAM: PORTABLE CHEST 1 VIEW COMPARISON:  03/29/2015 FINDINGS: Cardiac enlargement unchanged. Limited inspiratory effect. Bilateral lower lung zone opacity similar to prior study. Endotracheal tube tip 19 mm above the carina. Orogastric tube extends off the inferior edge of the film. Right central line stable. IMPRESSION: Lines and tubes as described above. Electronically Signed   By: Skipper Cliche M.D.   On: 03/29/2015 14:46   Dg Chest Port 1 View  03/29/2015  CLINICAL DATA:  Decreased oxygen saturation today. EXAM: PORTABLE CHEST 1 VIEW COMPARISON:  Single view of the chest 03/26/2015 and 03/25/2015. FINDINGS: Left IJ catheter has been removed since the most recent examination. Right IJ approach dialysis catheter remains in place. There is cardiomegaly without edema. Subsegmental atelectasis is seen in the right lung base. No pneumothorax or pleural effusion. IMPRESSION: No change in subsegmental atelectasis right lung base. Cardiomegaly without edema. Electronically Signed   By: Inge Rise M.D.   On: 03/29/2015 13:19   Dg Chest Port 1v Same Day  03/29/2015  CLINICAL DATA:  Central line placement. EXAM: PORTABLE CHEST 1 VIEW COMPARISON:  03/29/2015 FINDINGS: There has been interval placement of left internal jugular approach central venous catheter with tip terminating at the expected location of superior vena cava. Large dual lumen right internal jugular central venous catheter, and endotracheal tube are stable. Enteric catheter is partially visualized. The cardiac silhouette is enlarged. Mediastinal contours appear intact. There is no evidence of pneumothorax. Lung volumes are low with prominent interstitial markings. Osseous structures are without acute abnormality. Soft tissues are grossly normal. IMPRESSION: Status post placement of left internal jugular approach central venous catheter. No evidence of pneumothorax. Otherwise stable supporting apparatus. Low lung volumes with pulmonary vascular congestion. Enlarged cardiac silhouette. Electronically Signed   By: Fidela Salisbury M.D.   On: 03/29/2015 15:31   Dg Abd Portable 1v  03/29/2015  CLINICAL DATA:  55 year old male status post OG tube placement EXAM: PORTABLE ABDOMEN - 1 VIEW COMPARISON:  Concurrently obtained chest x-ray FINDINGS: The tip of the orogastric tube projects over the region of the gastric fundus. The bowel gas  pattern is not obstructed. No acute osseous abnormality. IMPRESSION: The tip of the orogastric tube projects over the gastric fundus in acceptable position. Electronically Signed   By: Jacqulynn Cadet M.D.   On: 03/29/2015 14:49     STUDIES:    CULTURES: 1/8 bc x 2>> 1/8 uc>> 1/8 sputum>>  ANTIBIOTICS: 1/8 vanc>> 1/8 zoysn>>  SIGNIFICANT EVENTS: 1/6 dc from cone follwing intubation  LINES/TUBES: 1/8 ETT>>  DISCUSSION: 55 yo MO AAF non compliant with HD and Cpap and re presents with hypercarbic and hypoxic resp failure  ASSESSMENT / PLAN:  PULMONARY A: VDRF with hypercarbia  OHS OSA P:   Wean Fio2 as able Push for PSV / SBT 1/9 as able Decrease RR given mild resp alk on RR 25 > RR 18 HD for volume reduction   CARDIOVASCULAR A:  Htn phtn mvr  P:  Hold antihypertensives for now, reinitiate  as sedation lifted   RENAL A:   ESRD, right ij tunneled hd cath  Hyperkalemia  P:   Appreciate Renakl's assistance, HD per their recs D/c Kayexalate   GASTROINTESTINAL A:   GI protection HX of peritoneal dialysis with no open wound  P:   PPI  HEMATOLOGIC A:   Anemia of chronic illness P:  Follow hgb  INFECTIOUS A:   Left antecubital wound  P:   Empiric vanco (renal dosing) + pip/tazo Follow cx data  ENDOCRINE A:   No acute issue P:     NEUROLOGIC A:   Encephalopathy, sedation P:   RASS goal: -1 Wean sedation as able Hold home clonazepam and amitriptyline for now   FAMILY  - Updates: None at bedside  - Inter-disciplinary family meet or Palliative Care meeting due by: 04/04/14   Independent CC time 35 minutes   Baltazar Apo, MD, PhD 03/30/2015, 8:50 AM [facility] [phone] or if no answer [phone]

## 2015-03-31 ENCOUNTER — Inpatient Hospital Stay (HOSPITAL_COMMUNITY): Payer: Medicare Other

## 2015-03-31 DIAGNOSIS — J9621 Acute and chronic respiratory failure with hypoxia: Secondary | ICD-10-CM | POA: Diagnosis not present

## 2015-03-31 DIAGNOSIS — J9600 Acute respiratory failure, unspecified whether with hypoxia or hypercapnia: Secondary | ICD-10-CM | POA: Insufficient documentation

## 2015-03-31 LAB — BASIC METABOLIC PANEL
ANION GAP: 13 (ref 5–15)
BUN: 35 mg/dL — AB (ref 6–20)
CHLORIDE: 98 mmol/L — AB (ref 101–111)
CO2: 31 mmol/L (ref 22–32)
Calcium: 7.5 mg/dL — ABNORMAL LOW (ref 8.9–10.3)
Creatinine, Ser: 10.58 mg/dL — ABNORMAL HIGH (ref 0.61–1.24)
GFR calc Af Amer: 6 mL/min — ABNORMAL LOW (ref >60)
GFR calc non Af Amer: 5 mL/min — ABNORMAL LOW (ref >60)
GLUCOSE: 98 mg/dL (ref 65–99)
POTASSIUM: 4.1 mmol/L (ref 3.5–5.1)
Sodium: 142 mmol/L (ref 135–145)

## 2015-03-31 LAB — CBC
HCT: 31.2 % — ABNORMAL LOW (ref 39.0–52.0)
HEMOGLOBIN: 8.9 g/dL — AB (ref 13.0–17.0)
MCH: 28.1 pg (ref 26.0–34.0)
MCHC: 28.5 g/dL — ABNORMAL LOW (ref 30.0–36.0)
MCV: 98.4 fL (ref 78.0–100.0)
Platelets: 135 10*3/uL — ABNORMAL LOW (ref 150–400)
RBC: 3.17 MIL/uL — AB (ref 4.22–5.81)
RDW: 17.1 % — ABNORMAL HIGH (ref 11.5–15.5)
WBC: 6.3 10*3/uL (ref 4.0–10.5)

## 2015-03-31 LAB — PHOSPHORUS: Phosphorus: 11.8 mg/dL — ABNORMAL HIGH (ref 2.5–4.6)

## 2015-03-31 LAB — MAGNESIUM: Magnesium: 2.4 mg/dL (ref 1.7–2.4)

## 2015-03-31 MED ORDER — HEPARIN SODIUM (PORCINE) 1000 UNIT/ML DIALYSIS: 1000.0000 [IU] | INTRAMUSCULAR | Status: DC | PRN

## 2015-03-31 MED ORDER — SODIUM CHLORIDE 0.9 % IV SOLN: 100.0000 mL | INTRAVENOUS | Status: DC | PRN

## 2015-03-31 MED ORDER — DOXERCALCIFEROL 4 MCG/2ML IV SOLN
INTRAVENOUS | Status: AC
  Administered 2015-03-31: 2 ug
  Filled 2015-03-31: qty 2

## 2015-03-31 MED ORDER — ALTEPLASE 2 MG IJ SOLR: 2.0000 mg | Freq: Once | INTRAMUSCULAR | Status: DC | PRN

## 2015-03-31 MED ORDER — LIDOCAINE HCL (PF) 1 % IJ SOLN: 5.0000 mL | INTRAMUSCULAR | Status: DC | PRN

## 2015-03-31 MED ORDER — PANTOPRAZOLE SODIUM 40 MG PO TBEC
40.0000 mg | DELAYED_RELEASE_TABLET | Freq: Every day | ORAL | Status: DC
  Administered 2015-03-31 – 2015-04-01 (×2): 40 mg via ORAL
  Filled 2015-03-31 (×2): qty 1

## 2015-03-31 MED ORDER — HEPARIN SODIUM (PORCINE) 1000 UNIT/ML DIALYSIS: 4200.0000 [IU] | Freq: Once | INTRAMUSCULAR | Status: DC

## 2015-03-31 MED ORDER — LIDOCAINE-PRILOCAINE 2.5-2.5 % EX CREA: 1.0000 "application " | TOPICAL_CREAM | CUTANEOUS | Status: DC | PRN

## 2015-03-31 MED ORDER — SODIUM CHLORIDE 0.9 % IJ SOLN: 10.0000 mL | INTRAMUSCULAR | Status: DC | PRN

## 2015-03-31 MED ORDER — IPRATROPIUM-ALBUTEROL 0.5-2.5 (3) MG/3ML IN SOLN
3.0000 mL | Freq: Four times a day (QID) | RESPIRATORY_TRACT | Status: DC
  Administered 2015-03-31 – 2015-04-05 (×22): 3 mL via RESPIRATORY_TRACT
  Filled 2015-03-31 (×22): qty 3

## 2015-03-31 MED ORDER — RENA-VITE PO TABS
1.0000 | ORAL_TABLET | Freq: Every day | ORAL | Status: DC
  Administered 2015-03-31 – 2015-04-14 (×14): 1 via ORAL
  Filled 2015-03-31 (×14): qty 1

## 2015-03-31 MED ORDER — TRAMADOL HCL 50 MG PO TABS
50.0000 mg | ORAL_TABLET | Freq: Two times a day (BID) | ORAL | Status: DC | PRN
  Administered 2015-04-01: 50 mg via ORAL
  Filled 2015-03-31: qty 1

## 2015-03-31 MED ORDER — PENTAFLUOROPROP-TETRAFLUOROETH EX AERO: 1.0000 "application " | INHALATION_SPRAY | CUTANEOUS | Status: DC | PRN

## 2015-03-31 MED ORDER — AMITRIPTYLINE HCL 25 MG PO TABS
25.0000 mg | ORAL_TABLET | Freq: Every day | ORAL | Status: DC
  Administered 2015-03-31 – 2015-04-12 (×12): 25 mg via ORAL
  Filled 2015-03-31 (×15): qty 1

## 2015-03-31 MED ORDER — LANTHANUM CARBONATE 500 MG PO CHEW
2000.0000 mg | CHEWABLE_TABLET | Freq: Three times a day (TID) | ORAL | Status: DC
  Administered 2015-03-31 – 2015-04-04 (×8): 2000 mg via ORAL
  Filled 2015-03-31 (×9): qty 4

## 2015-03-31 NOTE — Clinical Social Work Note (Signed)
Patient requested to talk with CSW. Mr. Disanti informed CSW that he sees a therapist regularly and wanted to talk with CSW. Patient discussed his health issues, frequent hospitalizations and how these issues affect his life and him emotionally. CSW listened, provided feedback as appropriate and questions to illicit feelings and emotions. Patient thanked CSW for her time and Mr. Fumero was encouraged to request to see this CSW again if needed.   Genelle Bal, MSW, LCSW Licensed Clinical Social Worker Clinical Social Work Department [employer] [phone]

## 2015-03-31 NOTE — Evaluation (Signed)
Physical Therapy Evaluation Patient Details Name: Gary Rivera MRN: [id number] DOB: [birth year] Today's Date: 03/31/2015   History of Present Illness  pt is a 55 y/o male with h/o ESRD on HD, CHF, HTN, admitted with hypercarbic resp failure. He missed dialysis and was intubated in ED when he failed NIMVS.  Extubated 1/9.  Clinical Impression  Pt admitted with/for respiratory failure.  Pt currently limited functionally due to the problems listed below.  (see problems list.)  Pt will benefit from PT to maximize function and safety to be able to get home safely with limited available assist of PCA and sister.     Follow Up Recommendations Home health PT;Supervision - Intermittent (assist from sister more often as needed initially)    Equipment Recommendations  None recommended by PT    Recommendations for Other Services       Precautions / Restrictions Precautions Precautions: None Precaution Comments: watch O2      Mobility  Bed Mobility Overal bed mobility: Modified Independent                Transfers Overall transfer level: Needs assistance   Transfers: Sit to/from Stand Sit to Stand: Supervision            Ambulation/Gait Ambulation/Gait assistance: Supervision           General Gait Details: marched in place without AD in lieu of ambulating in the hall which pt declined.  Stairs            Wheelchair Mobility    Modified Rankin (Stroke Patients Only)       Balance Overall balance assessment: Needs assistance   Sitting balance-Leahy Scale: Good     Standing balance support: No upper extremity supported Standing balance-Leahy Scale: Fair                               Pertinent Vitals/Pain Pain Assessment: No/denies pain    Home Living Family/patient expects to be discharged to:: Private residence Living Arrangements: Alone Available Help at Discharge: Family;Personal care attendant Type of Home: House Home  Access: Ramped entrance     Home Layout: One level Home Equipment: Environmental consultant - 2 wheels Additional Comments: uses walker when legs "feel bad"     Prior Function Level of Independence: Needs assistance   Gait / Transfers Assistance Needed: RW intermittently  ADL's / Homemaking Assistance Needed: HH aide assists with cooking and cleaning, getting in and out of shower        Hand Dominance   Dominant Hand: Right    Extremity/Trunk Assessment   Upper Extremity Assessment: Defer to OT evaluation           Lower Extremity Assessment: Generalized weakness;Overall [facility] for tasks assessed (especially Proximal weakness)         Communication   Communication: No difficulties  Cognition Arousal/Alertness: Awake/alert Behavior During Therapy: WFL for tasks assessed/performed Overall Cognitive Status: Within Functional Limits for tasks assessed                      General Comments General comments (skin integrity, edema, etc.): SpO2 on 4 L at 92/93% at upper 80's bpm    Exercises        Assessment/Plan    PT Assessment Patient needs continued PT services  PT Diagnosis Generalized weakness   PT Problem List Decreased strength;Decreased activity tolerance;Decreased balance;Decreased mobility;Cardiopulmonary status limiting activity  PT Treatment Interventions  Gait training;Functional mobility training;Therapeutic activities;Patient/family education;Balance training;DME instruction   PT Goals (Current goals can be found in the Care Plan section) Acute Rehab PT Goals Patient Stated Goal: Home PT Goal Formulation: With patient Time For Goal Achievement: 04/14/15 Potential to Achieve Goals: Good    Frequency Min 3X/week   Barriers to discharge        Co-evaluation               End of Session   Activity Tolerance: Patient tolerated treatment well Patient left: in bed;with call bell/phone within reach Nurse Communication: Mobility status          Time: 7017-7939 PT Time Calculation (min) (ACUTE ONLY): 19 min   Charges:   PT Evaluation $PT Eval Moderate Complexity: 1 Procedure     PT G Codes:        Jafeth Mustin, Eliseo Gum 03/31/2015, 6:30 PM 03/31/2015  [place] Bing, PT [phone]  (pager)

## 2015-03-31 NOTE — Progress Notes (Signed)
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 He missed dialysis and was intubated in ED when he failed NIMVS. K+ is 5.8. Renal is aware. We will give kayexalate to reduce K+, admit to ICU again. Empiric abx will be continued for now.   SUBJECTIVE: Reports feeling better. Now on O2 [place].   VITAL SIGNS: BP 73/45 mmHg  Pulse 74  Temp(Src) 97 F (36.1 C) (Oral)  Resp 24  Wt 128.9 kg (284 lb 2.8 oz)  SpO2 99%  HEMODYNAMICS: CVP:  [5 mmHg-12 mmHg] 12 mmHg  VENTILATOR SETTINGS: Vent Mode:  [-] PSV;CPAP FiO2 (%):  [50 %] 50 % PEEP:  [5 cmH20] 5 cmH20  INTAKE / OUTPUT: I/O last 3 completed shifts: In: 720 [I.V.:520; IV Piggyback:200] Out: 2000 [Other:2000]  PHYSICAL EXAMINATION: General: Alert, NAD Neuro: AAO X3, moves all extremities HEENT:  PERRLA Cardiovascular:  RRR, S1/S2 Lungs: Bilateral airflow,  Coarse rhonchi Abdomen: obese soft +bs Musculoskeletal:  intact Skin:  Left antecubital wound noted   LABS:  BMET  Recent Labs Lab 03/29/15 1205 03/30/15 0347 03/31/15 0505  NA 138 137 142  K 5.8* 3.3* 4.1  CL 98* 98* 98*  CO2 22 20* 31  BUN 62* 22* 35*  CREATININE 16.05* 7.09* 10.58*  GLUCOSE 93 87 98    Electrolytes  Recent Labs Lab 03/29/15 1205 03/29/15 1745 03/30/15 0347 03/31/15 0505  CALCIUM 8.0*  --  9.8 7.5*  MG  --  2.4  --  2.4  PHOS  --  5.5* 3.6 11.8*    CBC  Recent Labs Lab 03/29/15 1205 03/30/15 0347 03/31/15 0505  WBC 6.4 5.1 6.3  HGB 10.1* 9.0* 8.9*  HCT 34.2* 29.1* 31.2*  PLT 157 130* 135*    Coag's  Recent Labs Lab 03/29/15 1745  APTT 26  INR 1.23    Sepsis Markers  Recent Labs Lab 03/24/15 1253  03/24/15 2040 03/29/15 1231 03/29/15 1745  LATICACIDVEN 1.61  --  1.36 1.2  PROCALCITON  --  1.59  --  2.34    ABG  Recent Labs Lab 03/29/15 1246 03/29/15 1513 03/30/15 0355  PHART 7.150* 7.219* 7.558*  PCO2ART 82.3* 54.2* 22.8*  PO2ART 78.0* 87.0 54.2*    Liver Enzymes  Recent Labs Lab 03/27/15 0750 03/29/15 1205 03/30/15 0347  AST  --  22  --   ALT  --  15*  --   ALKPHOS  --  52  --   BILITOT  --  0.7  --   ALBUMIN 3.6 4.0 3.5    Cardiac Enzymes No results for input(s): TROPONINI, PROBNP in the last 168 hours.  Glucose  Recent Labs Lab 03/26/15 1504 03/26/15 1628 03/27/15 0907 03/27/15 1142 03/29/15 1139 03/29/15 1931  GLUCAP 120* 127* 110* 105* 92 118*    Imaging Dg Chest Port 1 View  03/31/2015  CLINICAL DATA:  Acute respiratory failure EXAM: PORTABLE CHEST 1 VIEW COMPARISON:  One day prior FINDINGS: Right-sided dialysis catheter is unchanged. A left internal jugular line terminates at the low SVC, also similar. Extubation. Removal of nasogastric tube. Mildly degraded exam due to AP portable technique and patient body habitus. Midline  trachea. Cardiomegaly accentuated by AP portable technique. moderate right hemidiaphragm elevation. Cannot exclude small bilateral pleural effusions. No pneumothorax. low low lung volumes with resultant prominence of the pulmonary interstitium. Right base atelectasis. Left base poorly evaluated. IMPRESSION: Multifactorial degradation, including further diminished lung volumes. Extubation with worsened bibasilar aeration, favoring atelectasis. cardiomegaly with pulmonary interstitial prominence, primarily felt to be due to low lung volumes. Mild interstitial edema cannot be excluded. Electronically Signed   By: Abigail Miyamoto M.D.   On: 03/31/2015 07:40     STUDIES:    CULTURES: 1/8 bc x 2>> 1/8 uc>> 1/8 sputum>>  ANTIBIOTICS: 1/8 vanc>> 1/8 zoysn>>  SIGNIFICANT EVENTS: 1/6 dc from cone follwing  intubation  LINES/TUBES: 1/8 ETT>>01/09  DISCUSSION: 55 yo MO AAF non compliant with HD and Cpap and re presents with hypercarbic and hypoxic resp failure  ASSESSMENT / PLAN:  PULMONARY A: VDRF with hypercarbia  OHS OSA P:   O2 [place] Decrease RR given mild resp alk on RR 25 > RR 18 HD for volume reduction   CARDIOVASCULAR A:  Htn phtn mvr  P:  Hold antihypertensives for now, reinitiate as sedation lifted   RENAL A:   ESRD, right ij tunneled hd cath  Hyperkalemia  P:   Appreciate Renakl's assistance, HD per their recs D/c Kayexalate   GASTROINTESTINAL A:   GI protection HX of peritoneal dialysis with no open wound  P:   PPI  HEMATOLOGIC A:   Anemia of chronic illness P:  Follow hgb  INFECTIOUS A:   Left antecubital wound  P:   Empiric vanco (renal dosing) + pip/tazo Follow cx data  ENDOCRINE A:   No acute issue P:     NEUROLOGIC A:   Encephalopathy, sedation P:   RASS goal: -1 Hold home clonazepam and amitriptyline for now   FAMILY  - Updates: None at bedside  - Inter-disciplinary family meet or Palliative Care meeting due by: 04/04/14   Magdalene S. [facility] ANP-BC Pulmonary and Critical Care Medicine [facility] Pager: [phone] 03/31/2015, 10:25 AM   Attending Note:  I have examined patient, reviewed labs, studies and notes. I have discussed the case with Arvil Chaco, and I agree with the data and plans as amended above. Rapid an significant improvement after HD. Extubated yesterday. Suspect based on desats last night that he has undiagnosed / untreated OSA. Will need an outpt PSG. Suspect he can go home 1/11.   Baltazar Apo, MD, PhD 03/31/2015, 12:48 PM [place] [facility] [phone] or if no answer [phone]

## 2015-03-31 NOTE — Progress Notes (Signed)
[facility]  Patient Status: Active (receiving services up to time of hospitalization)  [facility] is providing the following services: PT  If patient discharges after hours, please call [phone].   Gary Rivera 03/31/2015, 10:35 AM

## 2015-03-31 NOTE — Progress Notes (Signed)
[facility] Progress Note   Subjective: no compliants on HD  Filed Vitals:   03/31/15 0800 03/31/15 0830 03/31/15 0900 03/31/15 0930  BP: 84/53 81/36 83/46  73/45  Pulse: 74 74 74 74  Temp:      TempSrc:      Resp: 25 24 23 24   Weight:      SpO2: 96% 98% 99% 99%    Inpatient medications: . antiseptic oral rinse  7 mL Mouth Rinse BID  . doxercalciferol  2 mcg Intravenous Q T,Th,Sa-HD  . feeding supplement (ENSURE ENLIVE)  237 mL Oral BID BM  . feeding supplement (NEPRO CARB STEADY)  237 mL Oral BID BM  . [START ON 04/01/2015] heparin  4,200 Units Dialysis Once in dialysis  . heparin  5,000 Units Subcutaneous 3 times per day  . Influenza vac split quadrivalent PF  0.5 mL Intramuscular Tomorrow-1000  . ipratropium-albuterol  3 mL Nebulization Q6H  . pantoprazole (PROTONIX) IV  40 mg Intravenous QHS  . piperacillin-tazobactam (ZOSYN)  IV  2.25 g Intravenous 3 times per day  . vancomycin  1,000 mg Intravenous Q T,Th,Sa-HD   . propofol (DIPRIVAN) infusion Stopped (03/30/15 1000)   sodium chloride, sodium chloride, alteplase, fentaNYL (SUBLIMAZE) injection, fentaNYL (SUBLIMAZE) injection, fentaNYL (SUBLIMAZE) injection, heparin, lidocaine (PF), lidocaine-prilocaine, midazolam, midazolam, midazolam, pentafluoroprop-tetrafluoroeth  Exam: Alert on vent, respnosive No jvd Chest clear ant/ lat RRR no mrg Abd soft obese ntnd +bs No LE or UE edema R chest tunneled hd cath, clean exit site Neuro is nf, ox   CXR vasc congestion, atx no overt edema ABG 1/817 >  7.15/ 82/ 78 / 90% SaO2 [place]   4.5h   135kg   2/3.5 bath  R IJ cath   Hep 4200 Hect 2 ug Mircera 75 q 2wks Midodrine 10 tiw pre HD      Assessment: 1 Acute on chron resp failure - hypercarbic due to OHS +/- other, not volume issue 2 ESRD HD tts 3 Chron hypotens on midodrine pre HD 4 MBD binders 5 Volume well under dry wt and looks dry 6 GERD on reglan 7 Morbid obesity 8 Depression on elavil/  Klonopin 9 Anemia cont esa prn  Plan - HD today, keep even. He asked how to treat his hypercarbia and I told him significant wt loss.    Gary Moselle MD [facility] pager [phone]    cell [phone] 03/31/2015, 10:17 AM    Recent Labs Lab 03/29/15 1205 03/29/15 1745 03/30/15 0347 03/31/15 0505  NA 138  --  137 142  K 5.8*  --  3.3* 4.1  CL 98*  --  98* 98*  CO2 22  --  20* 31  GLUCOSE 93  --  87 98  BUN 62*  --  22* 35*  CREATININE 16.05*  --  7.09* 10.58*  CALCIUM 8.0*  --  9.8 7.5*  PHOS  --  5.5* 3.6 11.8*    Recent Labs Lab 03/27/15 0750 03/29/15 1205 03/30/15 0347  AST  --  22  --   ALT  --  15*  --   ALKPHOS  --  52  --   BILITOT  --  0.7  --   PROT  --  7.5  --   ALBUMIN 3.6 4.0 3.5    Recent Labs Lab 03/27/15 0750 03/29/15 1205 03/30/15 0347 03/31/15 0505  WBC 5.9 6.4 5.1 6.3  NEUTROABS 4.0 5.0  --   --   HGB 10.2* 10.1* 9.0*  8.9*  HCT 35.5* 34.2* 29.1* 31.2*  MCV 100.9* 97.2 92.1 98.4  PLT 134* 157 130* 135*

## 2015-04-01 ENCOUNTER — Inpatient Hospital Stay (HOSPITAL_COMMUNITY): Payer: Medicare Other

## 2015-04-01 DIAGNOSIS — J9602 Acute respiratory failure with hypercapnia: Secondary | ICD-10-CM

## 2015-04-01 DIAGNOSIS — I959 Hypotension, unspecified: Secondary | ICD-10-CM

## 2015-04-01 DIAGNOSIS — J189 Pneumonia, unspecified organism: Secondary | ICD-10-CM

## 2015-04-01 LAB — CBC WITH DIFFERENTIAL/PLATELET
BASOS PCT: 0 %
Basophils Absolute: 0 10*3/uL (ref 0.0–0.1)
EOS ABS: 0.2 10*3/uL (ref 0.0–0.7)
EOS PCT: 4 %
HCT: 33.5 % — ABNORMAL LOW (ref 39.0–52.0)
Hemoglobin: 9.8 g/dL — ABNORMAL LOW (ref 13.0–17.0)
LYMPHS ABS: 0.6 10*3/uL — AB (ref 0.7–4.0)
Lymphocytes Relative: 11 %
MCH: 28.6 pg (ref 26.0–34.0)
MCHC: 29.3 g/dL — AB (ref 30.0–36.0)
MCV: 97.7 fL (ref 78.0–100.0)
MONO ABS: 0.7 10*3/uL (ref 0.1–1.0)
Monocytes Relative: 12 %
NEUTROS ABS: 4.2 10*3/uL (ref 1.7–7.7)
Neutrophils Relative %: 73 %
PLATELETS: 135 10*3/uL — AB (ref 150–400)
RBC: 3.43 MIL/uL — ABNORMAL LOW (ref 4.22–5.81)
RDW: 16.6 % — AB (ref 11.5–15.5)
WBC: 5.7 10*3/uL (ref 4.0–10.5)

## 2015-04-01 LAB — BLOOD GAS, ARTERIAL
ACID-BASE EXCESS: 2.2 mmol/L — AB (ref 0.0–2.0)
Bicarbonate: 33.7 mEq/L — ABNORMAL HIGH (ref 20.0–24.0)
FIO2: 100
O2 SAT: 98.8 %
PATIENT TEMPERATURE: 98.6
TCO2: 38.3 mmol/L (ref 0–100)
pCO2 arterial: 151 mmHg (ref 35.0–45.0)
pH, Arterial: 6.979 — CL (ref 7.350–7.450)
pO2, Arterial: 199 mmHg — ABNORMAL HIGH (ref 80.0–100.0)

## 2015-04-01 LAB — RENAL FUNCTION PANEL
Albumin: 3.6 g/dL (ref 3.5–5.0)
Anion gap: 15 (ref 5–15)
BUN: 22 mg/dL — AB (ref 6–20)
CALCIUM: 8.3 mg/dL — AB (ref 8.9–10.3)
CHLORIDE: 97 mmol/L — AB (ref 101–111)
CO2: 27 mmol/L (ref 22–32)
CREATININE: 8.52 mg/dL — AB (ref 0.61–1.24)
GFR calc non Af Amer: 6 mL/min — ABNORMAL LOW (ref >60)
GFR, EST AFRICAN AMERICAN: 7 mL/min — AB (ref >60)
Glucose, Bld: 122 mg/dL — ABNORMAL HIGH (ref 65–99)
Phosphorus: 8.3 mg/dL — ABNORMAL HIGH (ref 2.5–4.6)
Potassium: 4.5 mmol/L (ref 3.5–5.1)
SODIUM: 139 mmol/L (ref 135–145)

## 2015-04-01 LAB — PROCALCITONIN: PROCALCITONIN: 2.57 ng/mL

## 2015-04-01 LAB — MAGNESIUM: MAGNESIUM: 2.2 mg/dL (ref 1.7–2.4)

## 2015-04-01 MED ORDER — IPRATROPIUM-ALBUTEROL 0.5-2.5 (3) MG/3ML IN SOLN
3.0000 mL | RESPIRATORY_TRACT | Status: DC | PRN
  Administered 2015-04-01: 3 mL via RESPIRATORY_TRACT

## 2015-04-01 MED ORDER — RACEPINEPHRINE HCL 2.25 % IN NEBU
0.5000 mL | INHALATION_SOLUTION | Freq: Once | RESPIRATORY_TRACT | Status: DC
  Filled 2015-04-01: qty 0.5

## 2015-04-01 MED ORDER — NEPRO/CARBSTEADY PO LIQD
237.0000 mL | Freq: Two times a day (BID) | ORAL | Status: DC
  Administered 2015-04-01: 237 mL via ORAL
  Filled 2015-04-01 (×8): qty 237

## 2015-04-01 MED ORDER — FENTANYL CITRATE (PF) 100 MCG/2ML IJ SOLN
INTRAMUSCULAR | Status: AC
  Administered 2015-04-01: 100 ug
  Filled 2015-04-01: qty 4

## 2015-04-01 MED ORDER — CHLORHEXIDINE GLUCONATE 0.12% ORAL RINSE (MEDLINE KIT)
15.0000 mL | Freq: Two times a day (BID) | OROMUCOSAL | Status: DC
  Administered 2015-04-02 – 2015-04-15 (×28): 15 mL via OROMUCOSAL

## 2015-04-01 MED ORDER — DARBEPOETIN ALFA 60 MCG/0.3ML IJ SOSY
60.0000 ug | PREFILLED_SYRINGE | INTRAMUSCULAR | Status: DC
  Administered 2015-04-02: 60 ug via INTRAVENOUS
  Filled 2015-04-01: qty 0.3

## 2015-04-01 MED ORDER — SODIUM CHLORIDE 0.9 % IV SOLN
25.0000 ug/h | INTRAVENOUS | Status: DC
  Administered 2015-04-02: 100 ug/h via INTRAVENOUS
  Administered 2015-04-02 – 2015-04-03 (×4): 350 ug/h via INTRAVENOUS
  Administered 2015-04-04: 150 ug/h via INTRAVENOUS
  Administered 2015-04-05 – 2015-04-06 (×2): 200 ug/h via INTRAVENOUS
  Administered 2015-04-06 – 2015-04-07 (×2): 300 ug/h via INTRAVENOUS
  Administered 2015-04-07: 350 ug/h via INTRAVENOUS
  Administered 2015-04-08: 400 ug/h via INTRAVENOUS
  Administered 2015-04-08: 200 ug/h via INTRAVENOUS
  Administered 2015-04-09 – 2015-04-10 (×5): 400 ug/h via INTRAVENOUS
  Administered 2015-04-11 (×2): 350 ug/h via INTRAVENOUS
  Filled 2015-04-01 (×24): qty 50

## 2015-04-01 MED ORDER — MIDAZOLAM HCL 2 MG/2ML IJ SOLN
INTRAMUSCULAR | Status: AC
  Administered 2015-04-01: 2 mg
  Filled 2015-04-01: qty 4

## 2015-04-01 MED ORDER — NOREPINEPHRINE BITARTRATE 1 MG/ML IV SOLN
0.0000 ug/min | INTRAVENOUS | Status: DC
  Administered 2015-04-01: 10 ug/min via INTRAVENOUS
  Filled 2015-04-01: qty 4

## 2015-04-01 MED ORDER — ANTISEPTIC ORAL RINSE SOLUTION (CORINZ)
7.0000 mL | OROMUCOSAL | Status: DC
  Administered 2015-04-02 – 2015-04-15 (×128): 7 mL via OROMUCOSAL

## 2015-04-01 MED ORDER — FENTANYL CITRATE (PF) 100 MCG/2ML IJ SOLN: 100.0000 ug | Freq: Once | INTRAMUSCULAR | Status: DC

## 2015-04-01 MED ORDER — MIDAZOLAM HCL 2 MG/2ML IJ SOLN: 2.0000 mg | Freq: Once | INTRAMUSCULAR | Status: DC

## 2015-04-01 MED ORDER — DOXERCALCIFEROL 4 MCG/2ML IV SOLN
3.0000 ug | INTRAVENOUS | Status: DC
  Administered 2015-04-02 – 2015-04-04 (×2): 3 ug via INTRAVENOUS
  Filled 2015-04-01 (×5): qty 2

## 2015-04-01 MED ORDER — SODIUM CHLORIDE 0.9 % IV SOLN
1.0000 mg/h | INTRAVENOUS | Status: DC
  Administered 2015-04-02 (×3): 10 mg/h via INTRAVENOUS
  Administered 2015-04-02: 4 mg/h via INTRAVENOUS
  Administered 2015-04-02 – 2015-04-06 (×4): 10 mg/h via INTRAVENOUS
  Administered 2015-04-06: 3 mg/h via INTRAVENOUS
  Administered 2015-04-07 (×3): 10 mg/h via INTRAVENOUS
  Filled 2015-04-01 (×18): qty 10

## 2015-04-01 NOTE — Progress Notes (Signed)
04/01/2015 12:47 PM  In to speak with patient regarding his reported lost belongings. Patient reported that he had a "blue dialysis bag with keys, and $90 in it." Called and spoke with Delice Bison on 2S where patient transferred from. Per Delice Bison, the patient was transferred with 2 patient belongings bags that were in his room, and no other belongings were found.   About 10 minutes later was alerted by the patients NT that his keys were located in his pants pocket as well as $1. Patient was recently discharged and reported that he could not remember if he brought his wallet back with him this time.    Savas Elvin, Charlyne Quale

## 2015-04-01 NOTE — Progress Notes (Signed)
eLink Physician-Brief Progress Note Patient Name: Gary Rivera DOB: 05-15-1960 MRN: [id number]   Date of Service  04/01/2015  HPI/Events of Note  ABG on 100% NRBM = 6.979/151/119/33.7. Patient has now been on BiPAP for 45 minutes.   eICU Interventions  Will repeat ABG now. If pCO2 not markedly improved, will require intubation and mechanical ventilation.      Intervention Category Major Interventions: Respiratory failure - evaluation and management  Sommer,Steven Eugene 04/01/2015, 10:43 PM

## 2015-04-01 NOTE — Procedures (Signed)
Intubation Procedure Note Gary Rivera [id number] [birth year]  Procedure: Intubation Indications: Respiratory insufficiency  Procedure Details Consent: Unable to obtain consent because of emergent medical necessity. Time Out: Verified patient identification, verified procedure, site/side was marked, verified correct patient position, special equipment/implants available, medications/allergies/relevent history reviewed, required imaging and test results available.  Performed  Maximum sterile technique was used including gloves, hand hygiene and mask.  Size 4 glide scope used with intubation via bronchoscopy.    Evaluation Hemodynamic Status: BP stable throughout; O2 sats: stable throughout Patient's Current Condition: stable Complications: No apparent complications Patient did tolerate procedure well. Chest X-ray ordered to verify placement.  CXR: pending.   Gary Rivera 04/01/2015

## 2015-04-01 NOTE — Progress Notes (Signed)
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 He missed dialysis and was intubated in ED when he failed NIMVS. K+ is 5.8. Renal is aware. We will give kayexalate to reduce K+, admit to ICU again. Empiric abx will be continued for now.  SUBJECTIVE: Obtunded this PM.   VITAL SIGNS: BP 91/42 mmHg  Pulse 70  Temp(Src) 98.4 F (36.9 C) (Oral)  Resp 21  Ht 6\' 2"  (1.88 m)  Wt 135.626 kg (299 lb)  BMI 38.37 kg/m2  SpO2 96%  HEMODYNAMICS:    VENTILATOR SETTINGS:    INTAKE / OUTPUT: I/O last 3 completed shifts: In: 720 [P.O.:720] Out: -100   PHYSICAL EXAMINATION: General: Very somnolent but arouses to sternal rub only moving all ext to pain, NAD Neuro: Very somnolent, as above. HEENT:  PERRLA, EOM-spontaneous and MMM Cardiovascular:  RRR, Nl S1/S2 and -M/R/G. Lungs: Decreased BS diffusely. Abdomen: obese, soft, NT, ND and +BS. Musculoskeletal:  intact Skin:  Left antecubital wound noted, right PIV antecubital  LABS:  BMET  Recent Labs Lab 03/30/15 0347 03/31/15 0505 04/01/15 0930  NA 137 142 139  K 3.3* 4.1 4.5  CL 98* 98* 97*  CO2 20* 31 27  BUN 22* 35* 22*  CREATININE 7.09* 10.58* 8.52*  GLUCOSE 87 98 122*   Electrolytes  Recent Labs Lab 03/29/15 1745 03/30/15 0347 03/31/15 0505 04/01/15 0930  CALCIUM  --  9.8 7.5* 8.3*  MG 2.4  --  2.4 2.2  PHOS 5.5* 3.6 11.8* 8.3*   CBC  Recent Labs Lab 03/30/15 0347 03/31/15 0505 04/01/15 0930  WBC 5.1 6.3 5.7  HGB 9.0* 8.9* 9.8*  HCT 29.1* 31.2* 33.5*  PLT 130* 135* 135*   Coag's  Recent Labs Lab 03/29/15 1745  APTT 26  INR 1.23   Sepsis Markers  Recent Labs Lab 03/29/15 1231  03/29/15 1745 04/01/15 0930  LATICACIDVEN 1.36 1.2  --   PROCALCITON  --  2.34 2.57   ABG  Recent Labs Lab 03/29/15 1513 03/30/15 0355 04/01/15 2135  PHART 7.219* 7.558* 6.979*  PCO2ART 54.2* 22.8* 151*  PO2ART 87.0 54.2* 199*   Liver Enzymes  Recent Labs Lab 03/29/15 1205 03/30/15 0347 04/01/15 0930  AST 22  --   --   ALT 15*  --   --   ALKPHOS 52  --   --   BILITOT 0.7  --   --   ALBUMIN 4.0 3.5 3.6    Cardiac Enzymes No results for input(s): TROPONINI, PROBNP in the last 168 hours.  Glucose  Recent Labs Lab 03/26/15 1504 03/26/15 1628 03/27/15 0907 03/27/15 1142 03/29/15 1139 03/29/15 1931  GLUCAP 120* 127* 110* 105* 92 118*    Imaging Dg Chest 2 View  04/01/2015  CLINICAL DATA:  Shortness of breath, confusion EXAM: CHEST  2 VIEW COMPARISON:  03/31/2015 FINDINGS: Cardiomegaly again noted. Central mild vascular congestion and mild perihilar interstitial prominence probable residual mild interstitial edema. Left IJ central line has been removed. Dual lumen right IJ catheter again noted. Again noted chronic elevation of the right hemidiaphragm with right basilar atelectasis. Persistent streaky atelectasis or infiltrate left base retrocardiac best seen  on lateral view. IMPRESSION: Central mild vascular congestion and mild perihilar interstitial prominence probable residual mild interstitial edema. Left IJ central line has been removed. Dual lumen right IJ catheter again noted. Again noted chronic elevation of the right hemidiaphragm with right basilar atelectasis. Persistent streaky atelectasis or infiltrate left base retrocardiac best seen on lateral view. Electronically Signed   By: Natasha Mead M.D.   On: 04/01/2015 10:58   STUDIES:   CULTURES: 1/8 bc x 2>> 1/8 uc>> 1/8 sputum>>  ANTIBIOTICS: 1/8 vanc>> 1/8 zoysn>>  SIGNIFICANT EVENTS: 1/6 dc from cone follwing intubation  LINES/TUBES: 1/8 ETT>>01/09>>>1/11>>>  DISCUSSION: 55 yo MO AAF non  compliant with HD and Cpap and re presents with hypercarbic and hypoxic resp failure  ASSESSMENT / PLAN:  PULMONARY A: VDRF with hypercarbia  OHS OSA Acute hypercarbic respiratory failure with hypercarbic encephalopathy. P:   Intubate Full vent support. F/U ABG and CXR. HD for volume reduction  Very difficult airway required bronchoscopy for intubation, so will keep sedated overnight.  CARDIOVASCULAR A:  Htn phtn mvr Shock, unknown etiology at this point. P:  Central line placement. Follow up CVP Levophed for BP support. Hold all anti-HTN medications.  RENAL A:   ESRD, right ij tunneled hd cath  Hyperkalemia  P:   HD per renal BMET in AM Follow CVP  GASTROINTESTINAL A:   GI protection HX of peritoneal dialysis with no open wound  P:   PPI NPO  HEMATOLOGIC A:   Anemia of chronic illness P:  Follow hgb  INFECTIOUS A:   Left antecubital wound  P:   Empiric vanco (renal dosing) + pip/tazo Follow cx data  ENDOCRINE A:   No acute issue P:   Monitor  NEUROLOGIC A:   Encephalopathy, hypercarbia P:   RASS goal: -1 Hold home clonazepam and amitriptyline for now Versed and fentanyl drip, patient is a very difficult airway and loss of his airway can very likely result in his death.  FAMILY  - Updates: None at bedside  - Inter-disciplinary family meet or Palliative Care meeting due by: 04/04/14  The patient is critically ill with multiple organ systems failure and requires high complexity decision making for assessment and support, frequent evaluation and titration of therapies, application of advanced monitoring technologies and extensive interpretation of multiple databases.   Critical Care Time devoted to patient care services described in this note is  45  Minutes. This time reflects time of care of this signee Dr Koren Bound. This critical care time does not reflect procedure time, or teaching time or supervisory time of PA/NP/Med student/Med  Resident etc but could involve care discussion time.  Alyson Reedy, M.D. [facility] Pulmonary/Critical Care Medicine. Pager: [phone]. After hours pager: [phone].

## 2015-04-01 NOTE — Care Management Important Message (Signed)
Important Message  Patient Details  Name: Gary Rivera MRN: [id number] Date of Birth: 06/11/1960   Medicare Important Message Given:  Yes    Atthew Coutant P Jemiah Cuadra 04/01/2015, 12:35 PM

## 2015-04-01 NOTE — Significant Event (Signed)
Rapid Response Event Note  Overview: Time Called: 2036 Arrival Time: 2038 Event Type: Respiratory  Initial Focused Assessment: Acute respiratory distress    Interventions: ABG and Bipap   Event Summary: Called by RT to assist with care of patient in acute respiratory distress. Patient was very lethargic but responsive to loud voice. Using accessory muscles and had moderate upper airway noise with tongue obstruction. Skin warm and dry. Heart sounds regular. O2 sats were 98% on Venti mask. An ABG was ordered along with Bipap due to level of distress. Spoke with Dr. Arsenio Loader about patient condition. Patient was moved to [facility] for further interventions.             Beryl Meager [place]

## 2015-04-01 NOTE — Progress Notes (Signed)
PULMONARY / CRITICAL CARE MEDICINE   Name: Gary Rivera MRN: [id number] DOB: [birth year]    ADMISSION DATE:  03/29/2015   REFERRING MD:  edp  CHIEF COMPLAINT:  Resp failure  HISTORY OF PRESENT ILLNESS:   55 yo MO aam with ESRD.MO,Non compliance, HTN, PHTN with dirty rt i j tunnled HD cath who was dc'd 03/27/15 for same complaint of hypercarbic resp failure. He missed dialysis and was intubated in ED when he failed NIMVS. K+ is 5.8. Renal is aware. We will give kayexalate to reduce K+, admit to ICU again. Empiric abx will be continued for now.  STUDIES:  Port CXR 1/10:  Personally reviewed by me. Lower lobe opacification bilaterally silhouetting the hemidiaphragms with right hilar fullness. Suspect some element of atelectasis postextubation. Complicated by low lung volumes.   CULTURES: 1/8 bc x 2>> 1/8 sputum>>Not obtained  ANTIBIOTICS: 1/8 vanc>> 1/8 zoysn>>  SIGNIFICANT EVENTS: 1/6 - dc from cone follwing intubation 1/8 - admit w/ intubation 1/10 - transfer out of ICU  LINES/TUBES: PIV x2 R IJ Tunneled HD Catheter 1/8 ETT>>01/09  SUBJECTIVE: Underwent successful HD on 1/10. Plan for continuing Andris Flurry, & Sat HD per Renal Note. Patient reports continued dyspnea and nonproductive cough. Denies any fever, chills, or sweats.  REVIEW OF SYSTEMS:  No frank chest pain or pressure. No nausea or emesis.  VITAL SIGNS: BP 103/54 mmHg  Pulse 87  Temp(Src) 98.6 F (37 C) (Oral)  Resp 20  Ht 6\' 2"  (1.88 m)  Wt 293 lb 3.4 oz (133 kg)  BMI 37.63 kg/m2  SpO2 92%  HEMODYNAMICS:    VENTILATOR SETTINGS:    INTAKE / OUTPUT: I/O last 3 completed shifts: In: 50 [IV Piggyback:50] Out: -100   PHYSICAL EXAMINATION: General: Obese. No distress. Awake. Neuro: Moving all 4 extremities equally. Grossly nonfocal. CN 2-12 in tact. Cardiovascular:  Regular rate. Unable to appreciate JVD. Normal S1 & S2. Lungs: Coarse rhonchi bilaterally. Normal work of breathing on [place] oxygen.  Speaking in complete sentences. Abdomen: Soft. Protuberant. Normal bowel sounds.  LABS:  BMET  Recent Labs Lab 03/29/15 1205 03/30/15 0347 03/31/15 0505  NA 138 137 142  K 5.8* 3.3* 4.1  CL 98* 98* 98*  CO2 22 20* 31  BUN 62* 22* 35*  CREATININE 16.05* 7.09* 10.58*  GLUCOSE 93 87 98    Electrolytes  Recent Labs Lab 03/29/15 1205 03/29/15 1745 03/30/15 0347 03/31/15 0505  CALCIUM 8.0*  --  9.8 7.5*  MG  --  2.4  --  2.4  PHOS  --  5.5* 3.6 11.8*    CBC  Recent Labs Lab 03/29/15 1205 03/30/15 0347 03/31/15 0505  WBC 6.4 5.1 6.3  HGB 10.1* 9.0* 8.9*  HCT 34.2* 29.1* 31.2*  PLT 157 130* 135*    Coag's  Recent Labs Lab 03/29/15 1745  APTT 26  INR 1.23    Sepsis Markers  Recent Labs Lab 03/29/15 1231 03/29/15 1745  LATICACIDVEN 1.36 1.2  PROCALCITON  --  2.34    ABG  Recent Labs Lab 03/29/15 1246 03/29/15 1513 03/30/15 0355  PHART 7.150* 7.219* 7.558*  PCO2ART 82.3* 54.2* 22.8*  PO2ART 78.0* 87.0 54.2*    Liver Enzymes  Recent Labs Lab 03/27/15 0750 03/29/15 1205 03/30/15 0347  AST  --  22  --   ALT  --  15*  --   ALKPHOS  --  52  --   BILITOT  --  0.7  --   ALBUMIN 3.6 4.0 3.5  Cardiac Enzymes No results for input(s): TROPONINI, PROBNP in the last 168 hours.  Glucose  Recent Labs Lab 03/26/15 1504 03/26/15 1628 03/27/15 0907 03/27/15 1142 03/29/15 1139 03/29/15 1931  GLUCAP 120* 127* 110* 105* 92 118*    Imaging No results found.   ASSESSMENT / PLAN:  55 yo MO AAF non compliant with HD and Cpap and re presents with hypercarbic and hypoxic resp failure. He missed 1 dose of HD after discharge before re-presenting to hospital and being intubated. Opacities on his CXR imaging are concerning for possible pneumonia and given his history of intubation & exposure HCAP is certainly possible. He is tolerating HD well and last dialyzed yesterday. I suspect his respiratory failure is more related to pneumonia than volume  overload.  1. Acute Hypoxic Respiratory Failure:  Improving. Likely secondary to HCAP. Continue incentive spirometer & treatment of HCAP. Wean FiO2 for Saturation >90%. 2. Acute Hypercarbic Respiratory Failure: Improving. Likely secondary to OSA/OHS. History of noncompliance with CPAP. Minimizing sedating medications. Duoneb qid. 3. Probable HCAP:  Checking procalcitonin per algorithm. Continuing Vancomycin & Zosyn Day #4. Awaiting blood culture results. No respiratory culture was obtained. Sending Respiratory Panel PCR. Repeat CXR PA/LAT today. 4. ESRD on HD:  Nephrology following. Plan to continue with HD on T, Th, & Sat. Continuing Hectrorol, Fosrenol, & Rena-Vit. 5. Hyperkalemia:  Resolved post dialysis. Likely due to a combination of missed dialysis & respiratory acidosis. 6. Left Antecubital Open Wound:  Continuing broad spectrum antibiotics. 7. H/O HTN:  No recorded home medications. Normotensive now. Monitoring vitals per unit protocol. 8. Pulmonary Hypertension:  Multi-factorial. Fluid balance per Nephrology is negative from baseline weight. 9. Acute Encephalopathy:  Resolved. Likely secondary to hypercarbia. Continuing to hold Klonopin & continue Elavil for now. 10. Anemia:  Chronic. No signs of active bleeding. Repeat hgb w/ CBC in AM. 11. Prophylaxis:  SCDs, Protonix, & Heparin [place] q8hr 12. Diet:  Renal HD diet. 13. Disposition:  Pending further clinical improvement. PT consulted.  PCCM to sign off & TRH to assume care on 1/12.  Donna Christen Jamison Neighbor, M.D. [facility] Pulmonary & Critical Care Pager:  [phone] After 3pm or if no response, call [phone]  04/01/2015, 8:40 AM

## 2015-04-01 NOTE — Progress Notes (Signed)
[name] Progress Note  Assessment/Plan: 1. Acute on chronic resp failure due to hypercarbia due to OHS/ required intubation; nebs, Vanc and Zosyn; CXR today prob mild int edema; BC pending 2. ESRD - TTS- next HD Thursday - try for more volume 3. Anemia - continue ESA dose Aranesp 1/12 Hgb 9.8 -  4. Secondary hyperparathyroidism -  Hectorol/binders - has been on 3.5 Ca bath- favor increasing hectorol to 3 due to ^ iPTH;  - use 2.5 Ca bath here 5. Chronic hypotension /volume - midodrine; UF 2000 1/9+ 100 with HD Tuesday - post wt variable - 5 kg higher than pre wts????-need standing weights consistently 6. Nutrition -renal diet/vitamin  7. GERD - on reglan 8. Depression - current meds 9. Left antecubital wound - seen by VVS 12/15 - has intermittently been on Vanc with wet to dry dressings 10. Dialysis adherence issues - seen by MSW; has outpt therapist; needs a lot of emotional support at least in part due to chronic medical issues.  Sheffield Slider, PA-C  [facility] [phone] 04/01/2015,10:33 AM  LOS: 3 days   Pt seen, examined, agree w assess/plan as above with additions as indicated. CXR's are all poor films w low inspiratory volume and crowded vessels, doubt pulm edema. Plan as above. [name] MD [facility] pager [phone]    cell [phone] 04/01/2015, 2:52 PM     Subjective:   lives alone, home O2  Objective Filed Vitals:   03/31/15 2240 04/01/15 0158 04/01/15 0503 04/01/15 0803  BP:   103/54   Pulse:   91 87  Temp:   98.6 F (37 C)   TempSrc:   Oral   Resp:   20 20  Height:      Weight:      SpO2: 100% 88% 97% 92%   Physical Exam General: sitting  up in bed Heart:RRR Lungs: poor expansion, congested Abdomen: obese soft Extremities: no sig LE edema Dialysis Access: right IJ TDC left upper AVF  Dialysis Orders: [facility] 4.5h 135g 2/3.5 bath R IJ cath Hep 4200 Hect 2 ug - had been leaving  below edw - last tmt prior to adm 12/31- EDW lowered to 135 after admission earlier this month Mircera 75 q 2wks 12/27  Midodrine 10 tiw pre HD hectorol 2 iPTH 849  Additional Objective Labs: Basic Metabolic Panel:  Recent Labs Lab 03/30/15 0347 03/31/15 0505 04/01/15 0930  NA 137 142 139  K 3.3* 4.1 4.5  CL 98* 98* 97*  CO2 20* 31 27  GLUCOSE 87 98 122*  BUN 22* 35* 22*  CREATININE 7.09* 10.58* 8.52*  CALCIUM 9.8 7.5* 8.3*  PHOS 3.6 11.8* 8.3*   Liver Function Tests:  Recent Labs Lab 03/29/15 1205 03/30/15 0347 04/01/15 0930  AST 22  --   --   ALT 15*  --   --   ALKPHOS 52  --   --   BILITOT 0.7  --   --   PROT 7.5  --   --   ALBUMIN 4.0 3.5 3.6   CBC:  Recent Labs Lab 03/27/15 0750 03/29/15 1205 03/30/15 0347 03/31/15 0505 04/01/15 0930  WBC 5.9 6.4 5.1 6.3 5.7  NEUTROABS 4.0 5.0  --   --  PENDING  HGB 10.2* 10.1* 9.0* 8.9* 9.8*  HCT 35.5* 34.2* 29.1* 31.2* 33.5*  MCV 100.9* 97.2 92.1 98.4 97.7  PLT 134* 157 130* 135* PENDING   Blood Culture    Component Value Date/Time  SDES BLOOD RIGHT ANTECUBITAL 03/29/2015 1242   SPECREQUEST BOTTLES DRAWN AEROBIC AND ANAEROBIC 3CCS 03/29/2015 1242   CULT NO GROWTH 2 DAYS 03/29/2015 1242   REPTSTATUS PENDING 03/29/2015 1242   CBG:  Recent Labs Lab 03/26/15 1628 03/27/15 0907 03/27/15 1142 03/29/15 1139 03/29/15 1931  GLUCAP 127* 110* 105* 92 118*   Studies/Results: Dg Chest Port 1 View  03/31/2015  CLINICAL DATA:  Acute respiratory failure EXAM: PORTABLE CHEST 1 VIEW COMPARISON:  One day prior FINDINGS: Right-sided dialysis catheter is unchanged. A left internal jugular line terminates at the low SVC, also similar. Extubation. Removal of nasogastric tube. Mildly degraded exam due to AP portable technique and patient body habitus. Midline trachea. Cardiomegaly accentuated by AP portable technique. moderate right hemidiaphragm elevation. Cannot exclude small bilateral pleural effusions. No pneumothorax.  low low lung volumes with resultant prominence of the pulmonary interstitium. Right base atelectasis. Left base poorly evaluated. IMPRESSION: Multifactorial degradation, including further diminished lung volumes. Extubation with worsened bibasilar aeration, favoring atelectasis. cardiomegaly with pulmonary interstitial prominence, primarily felt to be due to low lung volumes. Mild interstitial edema cannot be excluded. Electronically Signed   By: Jeronimo Greaves M.D.   On: 03/31/2015 07:40   Medications:   . amitriptyline  25 mg Oral QHS  . doxercalciferol  2 mcg Intravenous Q T,Th,Sa-HD  . heparin  5,000 Units Subcutaneous 3 times per day  . ipratropium-albuterol  3 mL Nebulization QID  . lanthanum  2,000 mg Oral TID WC  . multivitamin  1 tablet Oral QHS  . pantoprazole  40 mg Oral Daily  . piperacillin-tazobactam (ZOSYN)  IV  2.25 g Intravenous 3 times per day  . vancomycin  1,000 mg Intravenous Q T,Th,Sa-HD

## 2015-04-01 NOTE — Progress Notes (Signed)
Initial Nutrition Assessment  DOCUMENTATION CODES:   Obesity unspecified  INTERVENTION:  Provide Nepro Shake po BID, each supplement provides 425 kcal and 19 grams protein  Encourage adequate PO intake.   NUTRITION DIAGNOSIS:   Increased nutrient needs related to chronic illness as evidenced by estimated needs.  GOAL:   Patient will meet greater than or equal to 90% of their needs  MONITOR:   PO intake, Supplement acceptance, Weight trends, Labs, I & O's  REASON FOR ASSESSMENT:   Malnutrition Screening Tool    ASSESSMENT:   55 yo MO AAF non compliant with HD and Cpap and re presents with hypercarbic and hypoxic resp failure. He missed 1 dose of HD after discharge before re-presenting to hospital and being intubated. Opacities on his CXR imaging are concerning for possible pneumonia and given his history of intubation & exposure HCAP is certainly possible.  Extubated 1/9.  Pt reports appetite is fine. Noted no meal completion recorded. Pt reports usually consuming 2 meals a day. Usual body weight reported to be 282 lbs. Per Epic weight records, pt with a 5.4% weight loss in 1 month. Pt is agreeable to nutritional supplements to aid in caloric and protein needs. RD to order. Pt was encouraged to eat his food at meals.   Pt with no observed significant fat or muscle mass loss.  Phosphorous elevated at 8.3.  Diet Order:  Diet renal with fluid restriction Fluid restriction:: 1200 mL Fluid; Room service appropriate?: Yes; Fluid consistency:: Thin  Skin:  Wound (see comment) (Wound on L arm)  Last BM:  1/10  Height:   Ht Readings from Last 1 Encounters:  03/31/15 6\' 2"  (1.88 m)    Weight:   Wt Readings from Last 1 Encounters:  03/31/15 293 lb 3.4 oz (133 kg)    Ideal Body Weight:  86.36 kg  BMI:  Body mass index is 37.63 kg/(m^2).  Estimated Nutritional Needs:   Kcal:  2200-2450  Protein:  115-135 grams  Fluid:  1.2 L/day  EDUCATION NEEDS:   No education  needs identified at this time  Roslyn Smiling, MS, RD, LDN Pager # [phone] After hours/ weekend pager # [phone]

## 2015-04-01 NOTE — Progress Notes (Signed)
Pharmacy Antibiotic Follow-up Note  Gary Rivera is a 55 y.o. year-old male admitted on 03/29/2015.  The patient is currently on day 4 of vancomycin/zosyn for r/o sepsis (noted with left antecubital wound). Tolerating HD sessions  Plan: -Continue vancomycin 1000mg  IV after HD  -Continue Zosyn 2.25 grams iv Q 8 hours -Will follow renal function, cultures and clinical progress  Discontinue antibiotics?    Temp (24hrs), Avg:98.1 F (36.7 C), Min:97 F (36.1 C), Max:98.8 F (37.1 C)   Recent Labs Lab 03/27/15 0750 03/29/15 1205 03/30/15 0347 03/31/15 0505 04/01/15 0930  WBC 5.9 6.4 5.1 6.3 5.7     Recent Labs Lab 03/26/15 0420 03/27/15 0750 03/29/15 1205 03/30/15 0347 03/31/15 0505  CREATININE 9.00* 9.19* 16.05* 7.09* 10.58*   Estimated Creatinine Clearance: 11.4 mL/min (by C-G formula based on Cr of 10.58).    Allergies  Allergen Reactions  . Renvela [Sevelamer] Nausea And Vomiting    Antimicrobials this admission: 1/8 zosyn 1/8 vanc  Levels/dose changes this admission: n/a  Microbiology results: 03/28/14 MRSA PCR - neg 1/8 blood x2 NGTD  Thank you for allowing pharmacy to be a part of this patient's care.  Elwin Sleight PharmD 04/01/2015 10:15 AM

## 2015-04-01 NOTE — Progress Notes (Signed)
Rapid response called by RT while patient was receiving breathing treatment. Patient lethargic but arousable, with labored breathing, switched between venturi mask and non-rebreather, O2 sat in the 100s.Vital signs as follows; @ 20:59 ( BP=137/62, HR=98, RR=28, O2 sat=100 on non-rebreather), @ 21:10 (BP=147/62, HR=98, RR=39, O2 sat=100 on non-rebreather). Patient placed on Bipap per MD's order and  transferred to 2 H14.
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
## 2015-04-02 ENCOUNTER — Inpatient Hospital Stay (HOSPITAL_COMMUNITY): Payer: Medicare Other

## 2015-04-02 ENCOUNTER — Encounter (HOSPITAL_COMMUNITY): Payer: Self-pay | Admitting: Radiology

## 2015-04-02 DIAGNOSIS — I959 Hypotension, unspecified: Secondary | ICD-10-CM | POA: Insufficient documentation

## 2015-04-02 DIAGNOSIS — J9600 Acute respiratory failure, unspecified whether with hypoxia or hypercapnia: Secondary | ICD-10-CM

## 2015-04-02 DIAGNOSIS — Z452 Encounter for adjustment and management of vascular access device: Secondary | ICD-10-CM

## 2015-04-02 LAB — CBC WITH DIFFERENTIAL/PLATELET
BASOS ABS: 0 10*3/uL (ref 0.0–0.1)
BASOS PCT: 0 %
EOS ABS: 0.2 10*3/uL (ref 0.0–0.7)
EOS PCT: 3 %
HCT: 31.2 % — ABNORMAL LOW (ref 39.0–52.0)
Hemoglobin: 9.1 g/dL — ABNORMAL LOW (ref 13.0–17.0)
LYMPHS PCT: 20 %
Lymphs Abs: 1.5 10*3/uL (ref 0.7–4.0)
MCH: 27.7 pg (ref 26.0–34.0)
MCHC: 29.2 g/dL — ABNORMAL LOW (ref 30.0–36.0)
MCV: 94.8 fL (ref 78.0–100.0)
MONO ABS: 0.8 10*3/uL (ref 0.1–1.0)
Monocytes Relative: 11 %
Neutro Abs: 4.9 10*3/uL (ref 1.7–7.7)
Neutrophils Relative %: 66 %
PLATELETS: 153 10*3/uL (ref 150–400)
RBC: 3.29 MIL/uL — AB (ref 4.22–5.81)
RDW: 16.6 % — AB (ref 11.5–15.5)
WBC: 7.4 10*3/uL (ref 4.0–10.5)

## 2015-04-02 LAB — RENAL FUNCTION PANEL
ALBUMIN: 3.3 g/dL — AB (ref 3.5–5.0)
Albumin: 3.3 g/dL — ABNORMAL LOW (ref 3.5–5.0)
Anion gap: 15 (ref 5–15)
Anion gap: 19 — ABNORMAL HIGH (ref 5–15)
BUN: 35 mg/dL — AB (ref 6–20)
BUN: 41 mg/dL — AB (ref 6–20)
CALCIUM: 8.2 mg/dL — AB (ref 8.9–10.3)
CHLORIDE: 99 mmol/L — AB (ref 101–111)
CO2: 26 mmol/L (ref 22–32)
CO2: 23 mmol/L (ref 22–32)
CREATININE: 11.06 mg/dL — AB (ref 0.61–1.24)
Calcium: 8.7 mg/dL — ABNORMAL LOW (ref 8.9–10.3)
Chloride: 100 mmol/L — ABNORMAL LOW (ref 101–111)
Creatinine, Ser: 12.06 mg/dL — ABNORMAL HIGH (ref 0.61–1.24)
GFR calc Af Amer: 5 mL/min — ABNORMAL LOW (ref >60)
GFR calc Af Amer: 5 mL/min — ABNORMAL LOW (ref >60)
GFR, EST NON AFRICAN AMERICAN: 5 mL/min — AB (ref >60)
GFR, EST NON AFRICAN AMERICAN: 4 mL/min — AB (ref >60)
GLUCOSE: 97 mg/dL (ref 65–99)
Glucose, Bld: 98 mg/dL (ref 65–99)
PHOSPHORUS: 4.3 mg/dL (ref 2.5–4.6)
POTASSIUM: 3.8 mmol/L (ref 3.5–5.1)
POTASSIUM: 3.6 mmol/L (ref 3.5–5.1)
Phosphorus: 2.9 mg/dL (ref 2.5–4.6)
SODIUM: 141 mmol/L (ref 135–145)
Sodium: 141 mmol/L (ref 135–145)

## 2015-04-02 LAB — MAGNESIUM: MAGNESIUM: 2.1 mg/dL (ref 1.7–2.4)

## 2015-04-02 LAB — GLUCOSE, CAPILLARY: GLUCOSE-CAPILLARY: 91 mg/dL (ref 65–99)

## 2015-04-02 LAB — POCT I-STAT 3, ART BLOOD GAS (G3+)
ACID-BASE EXCESS: 2 mmol/L (ref 0.0–2.0)
Bicarbonate: 26.5 mEq/L — ABNORMAL HIGH (ref 20.0–24.0)
O2 SAT: 92 %
PH ART: 7.412 (ref 7.350–7.450)
PO2 ART: 64 mmHg — AB (ref 80.0–100.0)
Patient temperature: 98.6
TCO2: 28 mmol/L (ref 0–100)
pCO2 arterial: 41.6 mmHg (ref 35.0–45.0)

## 2015-04-02 MED ORDER — SCOPOLAMINE 1 MG/3DAYS TD PT72
1.0000 | MEDICATED_PATCH | TRANSDERMAL | Status: DC
Start: 2015-04-02 — End: 2015-04-04
  Administered 2015-04-02: 1.5 mg via TRANSDERMAL
  Filled 2015-04-02 (×2): qty 1

## 2015-04-02 MED ORDER — DARBEPOETIN ALFA 60 MCG/0.3ML IJ SOSY
60.0000 ug | PREFILLED_SYRINGE | INTRAMUSCULAR | Status: DC
  Filled 2015-04-02: qty 0.3

## 2015-04-02 MED ORDER — DARBEPOETIN ALFA 60 MCG/0.3ML IJ SOSY
PREFILLED_SYRINGE | INTRAMUSCULAR | Status: AC
  Filled 2015-04-02: qty 0.3

## 2015-04-02 MED ORDER — NOREPINEPHRINE BITARTRATE 1 MG/ML IV SOLN
0.0000 ug/min | INTRAVENOUS | Status: DC
  Administered 2015-04-02: 12 ug/min via INTRAVENOUS
  Administered 2015-04-03: 17 ug/min via INTRAVENOUS
  Administered 2015-04-03: 15 ug/min via INTRAVENOUS
  Administered 2015-04-04: 28 ug/min via INTRAVENOUS
  Filled 2015-04-02 (×3): qty 16

## 2015-04-02 MED ORDER — PIPERACILLIN-TAZOBACTAM 3.375 G IVPB
3.3750 g | Freq: Four times a day (QID) | INTRAVENOUS | Status: DC
  Administered 2015-04-02 – 2015-04-03 (×3): 3.375 g via INTRAVENOUS
  Filled 2015-04-02 (×5): qty 50

## 2015-04-02 MED ORDER — PANTOPRAZOLE SODIUM 40 MG PO PACK
40.0000 mg | PACK | Freq: Every day | ORAL | Status: DC
  Administered 2015-04-02 – 2015-04-14 (×13): 40 mg
  Filled 2015-04-02 (×13): qty 20

## 2015-04-02 MED ORDER — MIDAZOLAM HCL 2 MG/2ML IJ SOLN
2.0000 mg | Freq: Once | INTRAMUSCULAR | Status: AC
  Administered 2015-04-02: 2 mg via INTRAVENOUS

## 2015-04-02 MED ORDER — ALTEPLASE 2 MG IJ SOLR
2.0000 mg | Freq: Once | INTRAMUSCULAR | Status: AC | PRN
  Administered 2015-04-06: 2 mg
  Filled 2015-04-02 (×2): qty 2

## 2015-04-02 MED ORDER — DOXERCALCIFEROL 4 MCG/2ML IV SOLN
INTRAVENOUS | Status: AC
  Filled 2015-04-02: qty 2

## 2015-04-02 MED ORDER — MIDAZOLAM HCL 2 MG/2ML IJ SOLN: 2.0000 mg | Freq: Once | INTRAMUSCULAR | Status: DC

## 2015-04-02 MED ORDER — HYDROCORTISONE NA SUCCINATE PF 100 MG IJ SOLR
50.0000 mg | Freq: Four times a day (QID) | INTRAMUSCULAR | Status: DC
  Administered 2015-04-02 – 2015-04-07 (×19): 50 mg via INTRAVENOUS
  Filled 2015-04-02 (×19): qty 2

## 2015-04-02 MED ORDER — VANCOMYCIN HCL 10 G IV SOLR
1250.0000 mg | INTRAVENOUS | Status: DC
  Administered 2015-04-02: 1250 mg via INTRAVENOUS
  Filled 2015-04-02 (×2): qty 1250

## 2015-04-02 MED ORDER — PRISMASOL BGK 4/2.5 32-4-2.5 MEQ/L IV SOLN
INTRAVENOUS | Status: DC
  Administered 2015-04-02 – 2015-04-05 (×29): via INTRAVENOUS_CENTRAL
  Filled 2015-04-02 (×44): qty 5000

## 2015-04-02 MED ORDER — SODIUM CHLORIDE 0.9 % FOR CRRT
INTRAVENOUS_CENTRAL | Status: DC | PRN
  Administered 2015-04-10 (×4): via INTRAVENOUS_CENTRAL
  Filled 2015-04-02 (×5): qty 1000

## 2015-04-02 MED ORDER — FENTANYL CITRATE (PF) 100 MCG/2ML IJ SOLN
100.0000 ug | Freq: Once | INTRAMUSCULAR | Status: AC
Start: 2015-04-02 — End: 2015-04-02

## 2015-04-02 MED ORDER — HEPARIN SODIUM (PORCINE) 1000 UNIT/ML DIALYSIS
1000.0000 [IU] | INTRAMUSCULAR | Status: DC | PRN
  Administered 2015-04-06: 3000 [IU] via INTRAVENOUS_CENTRAL
  Administered 2015-04-08: 2200 [IU] via INTRAVENOUS_CENTRAL
  Administered 2015-04-10: 6000 [IU] via INTRAVENOUS_CENTRAL
  Filled 2015-04-02 (×4): qty 6
  Filled 2015-04-02: qty 5

## 2015-04-02 MED ORDER — VANCOMYCIN HCL 10 G IV SOLR
1250.0000 mg | INTRAVENOUS | Status: DC
  Filled 2015-04-02: qty 1250

## 2015-04-02 MED ORDER — PRISMASOL BGK 4/2.5 32-4-2.5 MEQ/L IV SOLN
INTRAVENOUS | Status: DC
  Administered 2015-04-02 – 2015-04-05 (×6): via INTRAVENOUS_CENTRAL
  Filled 2015-04-02 (×10): qty 5000

## 2015-04-02 MED ORDER — VASOPRESSIN 20 UNIT/ML IV SOLN
0.0300 [IU]/min | INTRAVENOUS | Status: DC
  Filled 2015-04-02 (×2): qty 2

## 2015-04-02 MED ORDER — IOHEXOL 300 MG/ML  SOLN
80.0000 mL | Freq: Once | INTRAMUSCULAR | Status: AC | PRN
  Administered 2015-04-02: 80 mL via INTRAVENOUS

## 2015-04-02 MED ORDER — PRISMASOL BGK 4/2.5 32-4-2.5 MEQ/L IV SOLN
INTRAVENOUS | Status: DC
  Administered 2015-04-02 – 2015-04-10 (×10): via INTRAVENOUS_CENTRAL
  Filled 2015-04-02 (×12): qty 5000

## 2015-04-02 MED ORDER — MIDAZOLAM HCL 2 MG/2ML IJ SOLN
2.0000 mg | INTRAMUSCULAR | Status: AC | PRN
  Administered 2015-04-02 (×3): 2 mg via INTRAVENOUS

## 2015-04-02 NOTE — Progress Notes (Signed)
Pt transported to CT on vent, no complication.

## 2015-04-02 NOTE — Progress Notes (Signed)
PT Cancellation Note  Patient Details Name: Gary Rivera MRN: [id number] DOB: 07/18/1960   Cancelled Treatment:    Reason Eval/Treat Not Completed: Medical issues which prohibited therapy; patient required intubation overnight.  Will cancel for today and attempt again tomorrow.   Elray Mcgregor 04/02/2015, 8:57 AM  Sheran Lawless, PT [phone] 04/02/2015

## 2015-04-02 NOTE — Progress Notes (Signed)
[facility] Progress Note  Assessment: 1. Acute on chronic resp failure - now reintubated w resp distress overnight. CXR showing worsening vasc congestion, poss early IS edema. CVP 15. Wt up to 134kg, was down to 129 lowest here. Dry wt was 135kg 2. ESRD on HD 3. Anemia - continue ESA dose Aranesp 1/12 Hgb 9.8 -  4. Secondary hyperparathyroidism -  Hectorol/binders - has been on 3.5 Ca bath- favor increasing hectorol to 3 due to ^ iPTH;  - use 2.5 Ca bath here 5. Hypotension - on pressors 6. Nutrition -renal diet/vitamin  7. GERD - on reglan 8. Depression - current meds 9. Left antecubital wound - seen by VVS 12/15 - has intermittently been on Vanc with wet to dry dressings 10. Dialysis adherence issues - seen by MSW; has outpt therapist; needs a lot of emotional support at least in part due to chronic medical issues.  Plan - CRRT, get vol down as BP tolerates. CT chest if possible would be helpful.     Subjective:  Had resp distress last night, failed bipap, intubated, now in ICU on vent and heavy sedation  Objective Filed Vitals:   04/02/15 0748 04/02/15 0800 04/02/15 0819 04/02/15 0900  BP:  111/69  102/69  Pulse:  78  80  Temp: 100.4 F (38 C)     TempSrc: Oral     Resp:  21  24  Height:      Weight:      SpO2:  98% 96% 96%   Physical Exam General: sitting  up in bed Heart:RRR Lungs: poor expansion, congested Abdomen: obese soft Extremities: no sig LE edema Dialysis Access: right IJ TDC left upper AVF  Dialysis Orders: [facility] 4.5h 135g 2/3.5 bath R IJ cath Hep 4200 Had been leaving below edw - last tmt prior to adm 12/31- EDW lowered to 135 after admission earlier this month Hect 2 ug  pth 849 Mircera 75 q 2wks 12/27  Midodrine 10 tiw pre HD  Additional Objective Labs: Basic Metabolic Panel:  Recent Labs Lab 03/31/15 0505 04/01/15 0930 04/02/15 0515  NA 142 139 141  K 4.1 4.5 3.8  CL 98* 97* 100*  CO2 31 27 26   GLUCOSE 98  122* 97  BUN 35* 22* 35*  CREATININE 10.58* 8.52* 11.06*  CALCIUM 7.5* 8.3* 8.2*  PHOS 11.8* 8.3* 4.3   Liver Function Tests:  Recent Labs Lab 03/29/15 1205 03/30/15 0347 04/01/15 0930 04/02/15 0515  AST 22  --   --   --   ALT 15*  --   --   --   ALKPHOS 52  --   --   --   BILITOT 0.7  --   --   --   PROT 7.5  --   --   --   ALBUMIN 4.0 3.5 3.6 3.3*   CBC:  Recent Labs Lab 03/29/15 1205 03/30/15 0347 03/31/15 0505 04/01/15 0930 04/02/15 0500  WBC 6.4 5.1 6.3 5.7 7.4  NEUTROABS 5.0  --   --  4.2 4.9  HGB 10.1* 9.0* 8.9* 9.8* 9.1*  HCT 34.2* 29.1* 31.2* 33.5* 31.2*  MCV 97.2 92.1 98.4 97.7 94.8  PLT 157 130* 135* 135* 153   Blood Culture    Component Value Date/Time   SDES BLOOD RIGHT ANTECUBITAL 03/29/2015 1242   SPECREQUEST BOTTLES DRAWN AEROBIC AND ANAEROBIC 3CCS 03/29/2015 1242   CULT NO GROWTH 3 DAYS 03/29/2015 1242   REPTSTATUS PENDING 03/29/2015 1242   CBG:  Recent Labs Lab 03/27/15 0907 03/27/15 1142 03/29/15 1139 03/29/15 1931 04/02/15 0746  GLUCAP 110* 105* 92 118* 91   Studies/Results: Dg Chest 2 View  04/01/2015  CLINICAL DATA:  Shortness of breath, confusion EXAM: CHEST  2 VIEW COMPARISON:  03/31/2015 FINDINGS: Cardiomegaly again noted. Central mild vascular congestion and mild perihilar interstitial prominence probable residual mild interstitial edema. Left IJ central line has been removed. Dual lumen right IJ catheter again noted. Again noted chronic elevation of the right hemidiaphragm with right basilar atelectasis. Persistent streaky atelectasis or infiltrate left base retrocardiac best seen on lateral view. IMPRESSION: Central mild vascular congestion and mild perihilar interstitial prominence probable residual mild interstitial edema. Left IJ central line has been removed. Dual lumen right IJ catheter again noted. Again noted chronic elevation of the right hemidiaphragm with right basilar atelectasis. Persistent streaky atelectasis or  infiltrate left base retrocardiac best seen on lateral view. Electronically Signed   By: Natasha Mead M.D.   On: 04/01/2015 10:58   Dg Chest Port 1 View  04/02/2015  CLINICAL DATA:  Assess endotracheal tube and central line placement. EXAM: PORTABLE CHEST 1 VIEW COMPARISON:  Radiographs today at 1016 hour FINDINGS: The endotracheal tube appears low, carina difficult to visualize, however likely less than 1 cm from the carina. Recommend retraction of 1-2 cm. Tip of the left central line in the region of the proximal SVC. Right-sided dialysis catheter remains in place. Cardiomegaly is again seen. Vascular congestion probable perihilar edema, with air bronchograms on the left. Elevation of right hemidiaphragm with adjacent right basilar atelectasis again seen. There is no pneumothorax. IMPRESSION: 1. Endotracheal tube appears low, retraction of 1-2 cm recommended. 2. Tip of the left central line in the region of the proximal SVC. No pneumothorax. 3. Cardiomegaly with vascular congestion and perihilar edema, similar allowing for differences in technique. These results were called by telephone at the time of interpretation on 04/02/2015 at 2:10 am to nurse Rocco Pauls, who verbally acknowledged these results. Electronically Signed   By: Rubye Oaks M.D.   On: 04/02/2015 02:11   Medications: . fentaNYL infusion INTRAVENOUS 350 mcg/hr (04/02/15 0800)  . midazolam (VERSED) infusion 10 mg/hr (04/02/15 0820)  . norepinephrine (LEVOPHED) Adult infusion 12 mcg/min (04/02/15 0821)   . amitriptyline  25 mg Oral QHS  . antiseptic oral rinse  7 mL Mouth Rinse 10 times per day  . chlorhexidine gluconate  15 mL Mouth Rinse BID  . Darbepoetin Alfa      . darbepoetin (ARANESP) injection - DIALYSIS  60 mcg Intravenous Q Thu-HD  . doxercalciferol      . doxercalciferol  3 mcg Intravenous Q T,Th,Sa-HD  . feeding supplement (NEPRO CARB STEADY)  237 mL Oral BID BM  . heparin  5,000 Units Subcutaneous 3 times per day   . ipratropium-albuterol  3 mL Nebulization QID  . lanthanum  2,000 mg Oral TID WC  . midazolam  2 mg Intravenous Once  . multivitamin  1 tablet Oral QHS  . pantoprazole  40 mg Oral Daily  . piperacillin-tazobactam (ZOSYN)  IV  2.25 g Intravenous 3 times per day  . Racepinephrine HCl  0.5 mL Nebulization Once  . scopolamine  1 patch Transdermal Q72H  . vancomycin  1,000 mg Intravenous Q T,Th,Sa-HD

## 2015-04-02 NOTE — Progress Notes (Signed)
[place] TEAM 1 - Stepdown/ICU TEAM Progress Note  Gary Rivera GEX:[id number] DOB: 1960-06-22 DOA: 03/29/2015 PCP: [name], MD  Admit HPI / Brief Narrative: 55 yo MO BM PMHx Anxiety, ESRD on HD, Non Compliance, HTN, CHF  PHTN with Dirty Rt IJ tunnled HD cath who was D/Ced 03/27/15 for same complaint of Hypercarbic Resp Failure.   He missed dialysis and was intubated in ED when he failed NIMVS. K+ is 5.8. Renal is aware. We will give kayexalate to reduce K+, admit to ICU again. Empiric abx will be continued for now.  HPI/Subjective: 1/12 attempted to see patient twice and patient was off the floor, however patient was seen by [facility] M, and Nephrology  Assessment/Plan:  VDRF/Hypercarbic respiratory failure- Acute severe OHS/OSA Very difficult airway required bronchoscopy for intubation by PCCM P:  Full vent support Wean daily HD per renal Reduce mV  CARDIOVASCULAR A:  HTN Pulmonary HTN MVR Shock, unknown etiology Chronic Combined CHF P:  Follow CVP 11 Levophed for MAP < 65 Hold antihypertensives Last echo reviewed Consider al ine Add vaso  May need swan  RENAL A:  ESRD, RIJ tunneled cath Hyperkalemia; resolved P:  HD per renal- for cvvhd BMET in AM  GASTROINTESTINAL A:  GI PPx P:  Protonix Feed today  HEMATOLOGIC A:  Anemia of chronic illness P:  CBC in AM dvt prevention  INFECTIOUS A:  Left antecubital wound  R/o retrocardiac infiltrate P:  Empiric Vanc/Zosyn Follow cultures Consider imaging chest  ENDOCRINE A:  No acute issues P:  Monitor cbg  NEUROLOGIC A:  Encephalopathy, hypercarbia P:  RASS goal: -1 Hold home clonazepam and amitriptyline for now Versed and fentanyl drip  Code Status: FULL Family Communication: no family present at time of exam Disposition Plan:     Consultants: Dr.Daniel Daneil Dan PCCM Dr.Robert Schertz Nephrology  Procedure/Significant Events: 1/6 Discharge from  cone following intubation 1/8 Admitted, intubated 1/9 Extubated 1/11 Re-intubated   Culture 1/8 bc x 2>> 1/8 uc>> 1/8 sputum>>   Antibiotics: 1/8 vanc>> 1/8 zoysn>>  DVT prophylaxis: Subcutaneous heparin   Devices    LINES / TUBES:      Continuous Infusions: . fentaNYL infusion INTRAVENOUS 350 mcg/hr (04/02/15 0800)  . midazolam (VERSED) infusion 10 mg/hr (04/02/15 1340)  . norepinephrine (LEVOPHED) Adult infusion 15 mcg/min (04/02/15 1200)  . dialysis replacement fluid (prismasate)    . dialysis replacement fluid (prismasate)    . dialysate (PRISMASATE)    . vasopressin (PITRESSIN) infusion - *FOR SHOCK*      Objective: VITAL SIGNS: Temp: 100.3 F (37.9 C) (01/12 1120) Temp Source: Oral (01/12 1120) BP: 100/65 mmHg (01/12 1600) Pulse Rate: 82 (01/12 1600) SPO2; FIO2:   Intake/Output Summary (Last 24 hours) at 04/02/15 1625 Last data filed at 04/02/15 1600  Gross per 24 hour  Intake 1792.64 ml  Output      0 ml  Net 1792.64 ml     Exam:  Patient off floor unable to examine        Data Reviewed: Basic Metabolic Panel:  Recent Labs Lab 03/29/15 1205 03/29/15 1745 03/30/15 0347 03/31/15 0505 04/01/15 0930 04/02/15 0515  NA 138  --  137 142 139 141  K 5.8*  --  3.3* 4.1 4.5 3.8  CL 98*  --  98* 98* 97* 100*  CO2 22  --  20* GLUCOSE 93  --  87 98 122* 97  BUN 62*  --  22* 35* 22* 35*  CREATININE  16.05*  --  7.09* 10.58* 8.52* 11.06*  CALCIUM 8.0*  --  9.8 7.5* 8.3* 8.2*  MG  --  2.4  --  2.4 2.2 2.1  PHOS  --  5.5* 3.6 11.8* 8.3* 4.3   Liver Function Tests:  Recent Labs Lab 03/27/15 0750 03/29/15 1205 03/30/15 0347 04/01/15 0930 04/02/15 0515  AST  --  22  --   --   --   ALT  --  15*  --   --   --   ALKPHOS  --  52  --   --   --   BILITOT  --  0.7  --   --   --   PROT  --  7.5  --   --   --   ALBUMIN 3.6 4.0 3.5 3.6 3.3*   No results for input(s): LIPASE, AMYLASE in the last 168 hours. No results for  input(s): AMMONIA in the last 168 hours. CBC:  Recent Labs Lab 03/27/15 0750 03/29/15 1205 03/30/15 0347 03/31/15 0505 04/01/15 0930 04/02/15 0500  WBC 5.9 6.4 5.1 6.3 5.7 7.4  NEUTROABS 4.0 5.0  --   --  4.2 4.9  HGB 10.2* 10.1* 9.0* 8.9* 9.8* 9.1*  HCT 35.5* 34.2* 29.1* 31.2* 33.5* 31.2*  MCV 100.9* 97.2 92.1 98.4 97.7 94.8  PLT 134* 157 130* 135* 135* 153   Cardiac Enzymes: No results for input(s): CKTOTAL, CKMB, CKMBINDEX, TROPONINI in the last 168 hours. BNP (last 3 results)  Recent Labs  06/25/14 1317 02/02/15 0035 03/29/15 1745  BNP 1060.8* 485.4* 401.8*    ProBNP (last 3 results) No results for input(s): PROBNP in the last 8760 hours.  CBG:  Recent Labs Lab 03/27/15 0907 03/27/15 1142 03/29/15 1139 03/29/15 1931 04/02/15 0746  GLUCAP 110* 105* 92 118* 91    Recent Results (from the past 240 hour(s))  Blood culture (routine x 2)     Status: None   Collection Time: 03/24/15 12:52 PM  Result Value Ref Range Status   Specimen Description BLOOD LEFT ANTECUBITAL  Final   Special Requests IN PEDIATRIC BOTTLE 1CCS  Final   Culture NO GROWTH 5 DAYS  Final   Report Status 03/30/2015 FINAL  Final  Blood culture (routine x 2)     Status: None   Collection Time: 03/24/15  1:21 PM  Result Value Ref Range Status   Specimen Description BLOOD RIGHT HAND  Final   Special Requests BOTTLES DRAWN AEROBIC AND ANAEROBIC 5CC  Final   Culture NO GROWTH 5 DAYS  Final   Report Status 03/29/2015 FINAL  Final  MRSA PCR Screening     Status: None   Collection Time: 03/24/15  5:20 PM  Result Value Ref Range Status   MRSA by PCR NEGATIVE NEGATIVE Final    Comment:        The GeneXpert MRSA Assay (FDA approved for NASAL specimens only), is one component of a comprehensive MRSA colonization surveillance program. It is not intended to diagnose MRSA infection nor to guide or monitor treatment for MRSA infections.   Culture, blood (routine x 2)     Status: None  (Preliminary result)   Collection Time: 03/29/15 12:05 PM  Result Value Ref Range Status   Specimen Description BLOOD RIGHT ANTECUBITAL  Final   Special Requests BOTTLES DRAWN AEROBIC AND ANAEROBIC  Final   Culture NO GROWTH 4 DAYS  Final   Report Status PENDING  Incomplete  Culture, blood (routine x 2)  Status: None (Preliminary result)   Collection Time: 03/29/15 12:42 PM  Result Value Ref Range Status   Specimen Description BLOOD RIGHT ANTECUBITAL  Final   Special Requests BOTTLES DRAWN AEROBIC AND ANAEROBIC 3CCS  Final   Culture NO GROWTH 4 DAYS  Final   Report Status PENDING  Incomplete  MRSA PCR Screening     Status: None   Collection Time: 03/29/15  5:48 PM  Result Value Ref Range Status   MRSA by PCR NEGATIVE NEGATIVE Final    Comment:        The GeneXpert MRSA Assay (FDA approved for NASAL specimens only), is one component of a comprehensive MRSA colonization surveillance program. It is not intended to diagnose MRSA infection nor to guide or monitor treatment for MRSA infections.      Studies:  Recent x-ray studies have been reviewed in detail by the Attending Physician  Scheduled Meds:  Scheduled Meds: . amitriptyline  25 mg Oral QHS  . antiseptic oral rinse  7 mL Mouth Rinse 10 times per day  . chlorhexidine gluconate  15 mL Mouth Rinse BID  . Darbepoetin Alfa      . darbepoetin (ARANESP) injection - DIALYSIS  60 mcg Intravenous Q Thu-HD  . doxercalciferol      . doxercalciferol  3 mcg Intravenous Q T,Th,Sa-HD  . feeding supplement (NEPRO CARB STEADY)  237 mL Oral BID BM  . heparin  5,000 Units Subcutaneous 3 times per day  . hydrocortisone sod succinate (SOLU-CORTEF) inj  50 mg Intravenous Q6H  . ipratropium-albuterol  3 mL Nebulization QID  . lanthanum  2,000 mg Oral TID WC  . midazolam  2 mg Intravenous Once  . multivitamin  1 tablet Oral QHS  . pantoprazole sodium  40 mg Per Tube QHS  . piperacillin-tazobactam (ZOSYN)  IV  3.375 g  Intravenous 4 times per day  . Racepinephrine HCl  0.5 mL Nebulization Once  . scopolamine  1 patch Transdermal Q72H  . vancomycin  1,250 mg Intravenous Q24H    Time spent on care of this patient: 40 mins   Teon Hudnall, Roselind Messier , MD  [facility] Office  [phone] Pager - [phone]  On-Call/Text Page:      [email or web address]      password [email or web address]  If 7PM-7AM, please contact night-coverage [email or web address] Password [email or web address] 04/02/2015, 4:25 PM   LOS: 4 days   Care during the described time interval was provided by me .  I have reviewed this patient's available data, including medical history, events of note, physical examination, and all test results as part of my evaluation. I have personally reviewed and interpreted all radiology studies.   Carolyne Littles, MD [phone] Pager

## 2015-04-02 NOTE — Progress Notes (Signed)
Called patient's sister, Tedford Mcgowen to update her on patient's condition and transfer.

## 2015-04-02 NOTE — Procedures (Signed)
Bronchoscopy Procedure Note TYSHEEN LAIBLE [id number] 09/27/1960  Procedure: Bronchoscopy Indications: Diagnostic evaluation of the airways    Procedure Details Consent: Unable to obtain consent because of emergent medical necessity. Time Out: Verified patient identification, verified procedure, site/side was marked, verified correct patient position, special equipment/implants available, medications/allergies/relevent history reviewed, required imaging and test results available.  Performed  In preparation for procedure, patient was given 100% FiO2 and bronchoscope lubricated. Sedation: Benzodiazepines, Muscle relaxants, Etomidate and fentanyl  Airway entered and the following bronchi were examined: RUL, RML, RLL, LUL, LLL and Bronchi.   Procedures performed: Brushings performed Bronchoscope removed.    Evaluation Hemodynamic Status: BP stable throughout; O2 sats: stable throughout Patient's Current Condition: stable Specimens:  None Complications: No apparent complications Patient did tolerate procedure well.   Koren Bound 04/02/2015

## 2015-04-02 NOTE — Progress Notes (Signed)
Pharmacy Antibiotic Follow-up Note  Gary Rivera is a 55 y.o. year-old male admitted on 03/29/2015.  The patient is currently on day 5 of vancomycin/zosyn for r/o sepsis (noted with left antecubital wound). Tolerating HD sessions  Respiratory failure overnight and re-intubated. Tmax of 101 overnight, wbc wnl New plans to start CRRT today 1/12. Will adjust abx accordingly  1/8 zosyn>> 1/8 vanc>>  1/8 BCx: NGTD 1/12 Resp cx>>  Plan: -Continue vancomycin 1250mg  IV q24 while on CRRT -Continue Zosyn 3.375g IV q6 hours while on CRRT -Will follow renal function, cultures and clinical progress  Temp (24hrs), Avg:99.5 F (37.5 C), Min:98 F (36.7 C), Max:101.1 F (38.4 C)   Recent Labs Lab 03/29/15 1205 03/30/15 0347 03/31/15 0505 04/01/15 0930 04/02/15 0500  WBC 6.4 5.1 6.3 5.7 7.4     Recent Labs Lab 03/29/15 1205 03/30/15 0347 03/31/15 0505 04/01/15 0930 04/02/15 0515  CREATININE 16.05* 7.09* 10.58* 8.52* 11.06*   Estimated Creatinine Clearance: 11 mL/min (by C-G formula based on Cr of 11.06).    Allergies  Allergen Reactions  . Renvela [Sevelamer] Nausea And Vomiting   Thank you for allowing pharmacy to be a part of this patient's care.  Sheppard Coil PharmD., BCPS Clinical Pharmacist Pager [phone] 04/02/2015 1:42 PM

## 2015-04-02 NOTE — Progress Notes (Signed)
Arrived to pt room at 0915, initiated treatment set up at bedside.  Per Dr. Arlean Hopping (at bedside 0935), holding on dialysis treatment for time being.

## 2015-04-02 NOTE — Procedures (Signed)
Central Venous Catheter Insertion Procedure Note Gary Rivera [id number] 07/26/1960  Procedure: Insertion of Central Venous Catheter Indications: Assessment of intravascular volume, Drug and/or fluid administration and Frequent blood sampling  Procedure Details Consent: Risks of procedure as well as the alternatives and risks of each were explained to the (patient/caregiver).  Consent for procedure obtained. Time Out: Verified patient identification, verified procedure, site/side was marked, verified correct patient position, special equipment/implants available, medications/allergies/relevent history reviewed, required imaging and test results available.  Performed  Maximum sterile technique was used including antiseptics, cap, gloves, gown, hand hygiene, mask and sheet. Skin prep: Chlorhexidine; local anesthetic administered A antimicrobial bonded/coated triple lumen catheter was placed in the left internal jugular vein using the Seldinger technique. Catheter placed to 22 cm. Blood aspirated via all 3 ports and then flushed x 3. Dressing applied.  Ultrasound guidance used.Yes.   Visualized wire in vessel  Evaluation Blood flow good Complications: No apparent complications Patient did tolerate procedure well. Chest X-ray ordered to verify placement.  CXR: pending.  Joneen Roach, AGACNP-BC [facility]monology/Critical Care Pager [phone] or [phone]  I was present and supervised the entire procedure.  U/S used in placement.  Alyson Reedy, M.D. [facility] Pulmonary/Critical Care Medicine. Pager: [phone]. After hours pager: [phone].  04/02/2015 1:27 AM

## 2015-04-02 NOTE — Progress Notes (Signed)
Nutrition Follow-up  DOCUMENTATION CODES:   Obesity unspecified  INTERVENTION:   If pt remains intubated recommend: Initiate Nepro @ 15 ml/hr.   60 ml Prostat five times per day.    Tube feeding regimen provides 1648 kcal, 179 grams of protein, and 261 ml of H2O.   NUTRITION DIAGNOSIS:   Increased nutrient needs related to chronic illness as evidenced by estimated needs. Ongoing.   GOAL:   Provide needs based on ASPEN/SCCM guidelines Not met.   MONITOR:   Skin, I & O's, Vent status, Labs, Weight trends  REASON FOR ASSESSMENT:   Ventilator   ASSESSMENT:   55 yo MO AAF non compliant with HD and Cpap and re presents with hypercarbic and hypoxic resp failure. He missed 1 dose of HD after discharge before re-presenting to hospital and being intubated. Opacities on his CXR imaging are concerning for possible pneumonia and given his history of intubation & exposure HCAP is certainly possible.   Patient is currently intubated on ventilator support MV: 15.5 L/min Temp (24hrs), Avg:99.5 F (37.5 C), Min:98 F (36.7 C), Max:101.1 F (38.4 C)  Medications reviewed and include: renavit Labs reviewed: Magnesium, Phosphorus, and potassium are WNL Pt re-intubated 1/11. NPO, no enteral access.  ESRD on HD starting on CRRT today.  Diet Order:    NPO  Skin:  Wound (see comment) (Incision on left arm)  Last BM:  1/10  Height:   Ht Readings from Last 1 Encounters:  03/31/15 _0  (1.88 m)   Weight:   Wt Readings from Last 1 Encounters:  04/02/15 296 lb 8 oz (134.492 kg)   Ideal Body Weight:  86.36 kg  BMI:  Body mass index is 38.05 kg/(m^2).  Estimated Nutritional Needs:   Kcal:  6924-9324  Protein:  >172 grams  Fluid:  1.2 L/day  EDUCATION NEEDS:   No education needs identified at this time  [place], [place], [place] Pager [phone] After Hours Pager

## 2015-04-02 NOTE — Progress Notes (Signed)
PULMONARY / CRITICAL CARE MEDICINE   Name: Gary Rivera MRN: [id number] DOB: [birth year]    ADMISSION DATE:  03/29/2015   REFERRING MD:  ED  CHIEF COMPLAINT:  Resp failure  HISTORY OF PRESENT ILLNESS:   55 yo MO aam with ESRD, non compliance, HTN, PHTN with dirty rt i j tunnled HD cath who was dc'd 03/27/15 for same complaint of hypercarbic resp failure. He missed dialysis and was intubated in ED when he failed NIMVS.   SUBJECTIVE: Patient re-intubated overnight after he failed BiPAP and ABG showing pH 6.9, pCO2 150.  VITAL SIGNS: BP 70/37 mmHg  Pulse 90  Temp(Src) 100.3 F (37.9 C) (Oral)  Resp 24  Ht 6\' 2"  (1.88 m)  Wt 296 lb 8 oz (134.492 kg)  BMI 38.05 kg/m2  SpO2 92%  HEMODYNAMICS: CVP:  [12 mmHg-15 mmHg] 12 mmHg  VENTILATOR SETTINGS: Vent Mode:  [-] PRVC FiO2 (%):  [40 %-50 %] 40 % Set Rate:  [18 bmp-24 bmp] 24 bmp Vt Set:  [640 mL] 640 mL PEEP:  [5 cmH20] 5 cmH20 Plateau Pressure:  [12 cmH20-29 cmH20] 27 cmH20  INTAKE / OUTPUT: I/O last 3 completed shifts: In: 1478.6 [P.O.:720; I.V.:708.6; IV Piggyback:50] Out: 0   PHYSICAL EXAMINATION: General: Intubated, sedated.  Neuro: RASS  HEENT:  PERRLA, EOM-spontaneous and MMM Cardiovascular:  RRR, Nl S1/S2 and -M/R/G. Lungs: coarse Abdomen: obese, soft, NT, ND and +BS. Musculoskeletal:  intact Skin:  Left antecubital wound noted, right PIV antecubital  LABS:  BMET  Recent Labs Lab 03/31/15 0505 04/01/15 0930 04/02/15 0515  NA 142 139 141  K 4.1 4.5 3.8  CL 98* 97* 100*  CO2 31 27 26   BUN 35* 22* 35*  CREATININE 10.58* 8.52* 11.06*  GLUCOSE 98 122* 97   Electrolytes  Recent Labs Lab 03/31/15 0505 04/01/15 0930 04/02/15 0515  CALCIUM 7.5* 8.3* 8.2*  MG 2.4 2.2 2.1  PHOS 11.8* 8.3* 4.3   CBC  Recent Labs Lab 03/31/15 0505 04/01/15 0930 04/02/15 0500  WBC 6.3 5.7 7.4  HGB 8.9* 9.8* 9.1*  HCT 31.2* 33.5* 31.2*  PLT 135* 135* 153   Coag's  Recent Labs Lab 03/29/15 1745  APTT  26  INR 1.23   Sepsis Markers  Recent Labs Lab 03/29/15 1231 03/29/15 1745 04/01/15 0930  LATICACIDVEN 1.36 1.2  --   PROCALCITON  --  2.34 2.57   ABG  Recent Labs Lab 03/30/15 0355 04/01/15 2135 04/02/15 0341  PHART 7.558* 6.979* 7.412  PCO2ART 22.8* 151* 41.6  PO2ART 54.2* 199* 64.0*   Liver Enzymes  Recent Labs Lab 03/29/15 1205 03/30/15 0347 04/01/15 0930 04/02/15 0515  AST 22  --   --   --   ALT 15*  --   --   --   ALKPHOS 52  --   --   --   BILITOT 0.7  --   --   --   ALBUMIN 4.0 3.5 3.6 3.3*    Glucose  Recent Labs Lab 03/26/15 1628 03/27/15 0907 03/27/15 1142 03/29/15 1139 03/29/15 1931 04/02/15 0746  GLUCAP 127* 110* 105* 92 118* 91    Imaging Dg Chest Port 1 View  04/02/2015  CLINICAL DATA:  Assess endotracheal tube and central line placement. EXAM: PORTABLE CHEST 1 VIEW COMPARISON:  Radiographs today at 1016 hour FINDINGS: The endotracheal tube appears low, carina difficult to visualize, however likely less than 1 cm from the carina. Recommend retraction of 1-2 cm. Tip of the left central line in  the region of the proximal SVC. Right-sided dialysis catheter remains in place. Cardiomegaly is again seen. Vascular congestion probable perihilar edema, with air bronchograms on the left. Elevation of right hemidiaphragm with adjacent right basilar atelectasis again seen. There is no pneumothorax. IMPRESSION: 1. Endotracheal tube appears low, retraction of 1-2 cm recommended. 2. Tip of the left central line in the region of the proximal SVC. No pneumothorax. 3. Cardiomegaly with vascular congestion and perihilar edema, similar allowing for differences in technique. These results were called by telephone at the time of interpretation on 04/02/2015 at 2:10 am to nurse Rocco Pauls, who verbally acknowledged these results. Electronically Signed   By: Rubye Oaks M.D.   On: 04/02/2015 02:11   STUDIES:   CULTURES: 1/8 bc x 2>> 1/8 uc>> 1/8  sputum>>  ANTIBIOTICS: 1/8 vanc>> 1/8 zoysn>>  SIGNIFICANT EVENTS: 1/6 Discharge from cone following intubation 1/8 Admitted, intubated 1/9 Extubated 1/11 Re-intubated  LINES/TUBES: 1/8 ETT>>01/09>>>1/11>>>  DISCUSSION: 55 yo MO AAF non compliant with HD and Cpap and re presents with hypercarbic and hypoxic resp failure  ASSESSMENT / PLAN:  PULMONARY A: VDRF/Hypercarbic respiratory failure- Acute severe OHS/OSA Very difficult airway required bronchoscopy for intubation by PCCM P:   Full vent support Wean daily HD per renal Reduce mV  CARDIOVASCULAR A:  HTN Pulmonary HTN MVR Shock, unknown etiology Chronic Combined CHF P:  Follow CVP 11 Levophed for MAP < 65 Hold antihypertensives Last echo reviewed Consider al ine Add vaso  May need swan  RENAL A:   ESRD, RIJ tunneled cath Hyperkalemia; resolved P:   HD per renal- for cvvhd BMET in AM  GASTROINTESTINAL A:   GI PPx P:   Protonix Feed today  HEMATOLOGIC A:   Anemia of chronic illness P:  CBC in AM dvt prevention  INFECTIOUS A:   Left antecubital wound  R/o retrocardiac infiltrate P:   Empiric Vanc/Zosyn Follow cultures Consider imaging chest  ENDOCRINE A:   No acute issues P:   Monitor cbg  NEUROLOGIC A:   Encephalopathy, hypercarbia P:   RASS goal: -1 Hold home clonazepam and amitriptyline for now Versed and fentanyl drip  FAMILY  - Updates: None at bedside  - Inter-disciplinary family meet or Palliative Care meeting due by: 04/04/14  STAFF NOTE: Cindi [place], MD FACP have personally reviewed patient's available data, including medical history, events of note, physical examination and test results as part of my evaluation. I have discussed with resident/NP and other care providers such as pharmacist, RN and RRT. In addition, I personally evaluated patient and elicited key findings of: his clinical presentation is that of airway obstruction acute and near arrest  from this in setting osa and now reoccurent resp failure, we should consider tracheostomy early, distant BS< retrocardiac infiltrate?, ok for CT chest, continued empiric abx, abg last reviewed, slight drop in rr, ischemia to r/o , last echo recent, overall concern is he stopped breathing  / obstruction , trach likely needed The patient is critically ill with multiple organ systems failure and requires high complexity decision making for assessment and support, frequent evaluation and titration of therapies, application of advanced monitoring technologies and extensive interpretation of multiple databases.   Critical Care Time devoted to patient care services described in this note is30 Minutes. This time reflects time of care of this signee: Rory Percy, MD FACP. This critical care time does not reflect procedure time, or teaching time or supervisory time of PA/NP/Med student/Med Resident etc but could involve care discussion  time. Rest per NP/medical resident whose note is outlined above and that I agree with   Mcarthur Rossetti. Tyson Alias, MD, FACP Pgr: [phone] [place] [facility] 04/02/2015 2:04 PM

## 2015-04-03 ENCOUNTER — Inpatient Hospital Stay (HOSPITAL_COMMUNITY): Payer: Medicare Other

## 2015-04-03 DIAGNOSIS — I9589 Other hypotension: Secondary | ICD-10-CM

## 2015-04-03 LAB — CBC WITH DIFFERENTIAL/PLATELET
BASOS ABS: 0 10*3/uL (ref 0.0–0.1)
Basophils Relative: 0 %
EOS ABS: 0.1 10*3/uL (ref 0.0–0.7)
Eosinophils Relative: 1 %
HCT: 32.9 % — ABNORMAL LOW (ref 39.0–52.0)
HEMOGLOBIN: 10.2 g/dL — AB (ref 13.0–17.0)
LYMPHS PCT: 7 %
Lymphs Abs: 0.7 10*3/uL (ref 0.7–4.0)
MCH: 28.3 pg (ref 26.0–34.0)
MCHC: 31 g/dL (ref 30.0–36.0)
MCV: 91.1 fL (ref 78.0–100.0)
Monocytes Absolute: 0.5 10*3/uL (ref 0.1–1.0)
Monocytes Relative: 5 %
NEUTROS PCT: 87 %
Neutro Abs: 8.8 10*3/uL — ABNORMAL HIGH (ref 1.7–7.7)
PLATELETS: 158 10*3/uL (ref 150–400)
RBC: 3.61 MIL/uL — AB (ref 4.22–5.81)
RDW: 17.4 % — ABNORMAL HIGH (ref 11.5–15.5)
WBC: 10.1 10*3/uL (ref 4.0–10.5)

## 2015-04-03 LAB — RENAL FUNCTION PANEL
ALBUMIN: 3.2 g/dL — AB (ref 3.5–5.0)
ANION GAP: 17 — AB (ref 5–15)
ANION GAP: 15 (ref 5–15)
Albumin: 3.3 g/dL — ABNORMAL LOW (ref 3.5–5.0)
BUN: 31 mg/dL — ABNORMAL HIGH (ref 6–20)
BUN: 30 mg/dL — ABNORMAL HIGH (ref 6–20)
CALCIUM: 8.8 mg/dL — AB (ref 8.9–10.3)
CHLORIDE: 100 mmol/L — AB (ref 101–111)
CO2: 23 mmol/L (ref 22–32)
CO2: 24 mmol/L (ref 22–32)
CREATININE: 8.57 mg/dL — AB (ref 0.61–1.24)
Calcium: 9.3 mg/dL (ref 8.9–10.3)
Chloride: 99 mmol/L — ABNORMAL LOW (ref 101–111)
Creatinine, Ser: 6.37 mg/dL — ABNORMAL HIGH (ref 0.61–1.24)
GFR calc non Af Amer: 6 mL/min — ABNORMAL LOW (ref >60)
GFR calc non Af Amer: 9 mL/min — ABNORMAL LOW (ref >60)
GFR, EST AFRICAN AMERICAN: 7 mL/min — AB (ref >60)
GFR, EST AFRICAN AMERICAN: 10 mL/min — AB (ref >60)
GLUCOSE: 124 mg/dL — AB (ref 65–99)
Glucose, Bld: 150 mg/dL — ABNORMAL HIGH (ref 65–99)
PHOSPHORUS: 4.3 mg/dL (ref 2.5–4.6)
Phosphorus: 3.5 mg/dL (ref 2.5–4.6)
Potassium: 3.9 mmol/L (ref 3.5–5.1)
Potassium: 4.1 mmol/L (ref 3.5–5.1)
SODIUM: 139 mmol/L (ref 135–145)
Sodium: 139 mmol/L (ref 135–145)

## 2015-04-03 LAB — CULTURE, BLOOD (ROUTINE X 2)
Culture: NO GROWTH
Culture: NO GROWTH

## 2015-04-03 LAB — GLUCOSE, CAPILLARY
GLUCOSE-CAPILLARY: 130 mg/dL — AB (ref 65–99)
Glucose-Capillary: 133 mg/dL — ABNORMAL HIGH (ref 65–99)

## 2015-04-03 LAB — RESPIRATORY VIRUS PANEL
ADENOVIRUS: NEGATIVE
INFLUENZA B 1: NEGATIVE
Influenza A: NEGATIVE
METAPNEUMOVIRUS: NEGATIVE
PARAINFLUENZA 3 A: NEGATIVE
Parainfluenza 1: NEGATIVE
Parainfluenza 2: NEGATIVE
RESPIRATORY SYNCYTIAL VIRUS A: NEGATIVE
RHINOVIRUS: NEGATIVE
Respiratory Syncytial Virus B: NEGATIVE

## 2015-04-03 LAB — MAGNESIUM: Magnesium: 2.2 mg/dL (ref 1.7–2.4)

## 2015-04-03 LAB — PROCALCITONIN: PROCALCITONIN: 1.77 ng/mL

## 2015-04-03 LAB — POCT ACTIVATED CLOTTING TIME: Activated Clotting Time: 147 seconds

## 2015-04-03 LAB — APTT: APTT: 42 s — AB (ref 24–37)

## 2015-04-03 MED ORDER — NEPRO/CARBSTEADY PO LIQD
1000.0000 mL | ORAL | Status: DC
  Administered 2015-04-03 – 2015-04-14 (×12): 1000 mL
  Filled 2015-04-03 (×17): qty 1000

## 2015-04-03 MED ORDER — SODIUM CHLORIDE 0.9 % IV SOLN
INTRAVENOUS | Status: DC
  Administered 2015-04-09 – 2015-04-13 (×2): via INTRAVENOUS

## 2015-04-03 MED ORDER — SODIUM CHLORIDE 0.9 % IJ SOLN
250.0000 [IU]/h | INTRAMUSCULAR | Status: DC
  Administered 2015-04-03: 250 [IU]/h via INTRAVENOUS_CENTRAL
  Administered 2015-04-04 (×2): 1800 [IU]/h via INTRAVENOUS_CENTRAL
  Administered 2015-04-04: 1150 [IU]/h via INTRAVENOUS_CENTRAL
  Administered 2015-04-04: 1750 [IU]/h via INTRAVENOUS_CENTRAL
  Administered 2015-04-05 (×4): 1800 [IU]/h via INTRAVENOUS_CENTRAL
  Filled 2015-04-03 (×10): qty 2

## 2015-04-03 MED ORDER — PRO-STAT SUGAR FREE PO LIQD
60.0000 mL | Freq: Every day | ORAL | Status: DC
  Administered 2015-04-03 – 2015-04-15 (×56): 60 mL
  Filled 2015-04-03 (×55): qty 60

## 2015-04-03 MED ORDER — HEPARIN BOLUS VIA INFUSION (CRRT)
1000.0000 [IU] | INTRAVENOUS | Status: DC | PRN
  Filled 2015-04-03: qty 1000

## 2015-04-03 MED ORDER — VITAL HIGH PROTEIN PO LIQD
1000.0000 mL | ORAL | Status: DC
  Administered 2015-04-03: 1000 mL

## 2015-04-03 MED ORDER — HEPARIN SODIUM (PORCINE) 5000 UNIT/ML IJ SOLN
5000.0000 [IU] | Freq: Two times a day (BID) | INTRAMUSCULAR | Status: DC
  Administered 2015-04-03 – 2015-04-05 (×4): 5000 [IU] via SUBCUTANEOUS
  Filled 2015-04-03 (×5): qty 1

## 2015-04-03 NOTE — Progress Notes (Signed)
Nutrition Follow-up  DOCUMENTATION CODES:   Obesity unspecified  INTERVENTION:   Initiate Nepro @ 15 ml/hr.   60 ml Prostat five times per day.   Tube feeding regimen provides 1648 kcal, 179 grams of protein, and 261 ml of H2O.   NUTRITION DIAGNOSIS:   Increased nutrient needs related to chronic illness as evidenced by estimated needs. Ongoing.   GOAL:   Provide needs based on ASPEN/SCCM guidelines Not met.   MONITOR:   TF tolerance, Skin, I & O's, Vent status, Labs  REASON FOR ASSESSMENT:   Consult Enteral/tube feeding initiation and management  ASSESSMENT:   55 yo MO AAF non compliant with HD and Cpap and re presents with hypercarbic and hypoxic resp failure. He missed 1 dose of HD after discharge before re-presenting to hospital and being intubated. Opacities on his CXR imaging are concerning for possible pneumonia and given his history of intubation & exposure HCAP is certainly possible.   Patient is currently intubated on ventilator support MV: 16 L/min Temp (24hrs), Avg:98 F (36.7 C), Min:97.3 F (36.3 C), Max:99.4 F (37.4 C)  1/11 re-intubated, difficult airway  CVVHD for negative balance  Diet Order:    NPO  Skin:  Reviewed, no issues (Incisions)  Last BM:  1/10  Height:   Ht Readings from Last 1 Encounters:  03/31/15 '6\' 2"'  (1.88 m)   Weight:   Wt Readings from Last 1 Encounters:  04/03/15 297 lb 9.9 oz (135 kg)   Ideal Body Weight:  86.36 kg  BMI:  Body mass index is 38.2 kg/(m^2).  Estimated Nutritional Needs:   Kcal:  5956-3875  Protein:  >172 grams  Fluid:  1.2 L/day  EDUCATION NEEDS:   No education needs identified at this time  [place], [place], [place] Pager [phone] After Hours Pager

## 2015-04-03 NOTE — Progress Notes (Signed)
PULMONARY / CRITICAL CARE MEDICINE   Name: Gary Rivera MRN: [id number] DOB: 06-17-1960    ADMISSION DATE:  03/29/2015   REFERRING MD:  ED  CHIEF COMPLAINT:  Resp failure   SUBJECTIVE: slightly hypothermic overnight, temp now normal with warming blanket. Even on CVVHD. Still on Levo, no vaso today.   VITAL SIGNS: BP 105/54 mmHg  Pulse 60  Temp(Src) 97.7 F (36.5 C) (Oral)  Resp 21  Ht  (1.88 m)  Wt 297 lb 9.9 oz (135 kg)  BMI 38.20 kg/m2  SpO2 92%  HEMODYNAMICS: CVP:  [11 mmHg-15 mmHg] 11 mmHg  VENTILATOR SETTINGS: Vent Mode:  [-] PRVC FiO2 (%):  [40 %] 40 % Set Rate:  [24 bmp] 24 bmp Vt Set:  [640 mL] 640 mL PEEP:  [5 cmH20] 5 cmH20 Plateau Pressure:  [24 cmH20-29 cmH20] 27 cmH20  INTAKE / OUTPUT: I/O last 3 completed shifts: In: 2833.1 [I.V.:2163.1; NG/GT:70; IV Piggyback:600] Out: 3196 [Emesis/NG output:500; Other:2696]  PHYSICAL EXAMINATION: General: Intubated, sedated.  Neuro: RASS -2, moves all extremities spontaneously.  HEENT:  PERRL, moist mucus membranes. ETT in place. RIJ tunneled cath, LIJ CVL.  Cardiovascular:  RRR, no murmurs, gallops, or rubs.  Lungs: Air entry equal bilaterally, coarse, no rales or wheezes.  Abdomen: obese, soft, NT, ND and +BS. Musculoskeletal:  Intact. Skin:  Left antecubital wound noted, right PIV antecubital  LABS:  BMET  Recent Labs Lab 04/02/15 0515 04/02/15 1616 04/03/15 0530  NA 141 141 139  K 3.8 3.6 3.9  CL 100* 99* 99*  CO2 BUN 35* 41* 31*  CREATININE 11.06* 12.06* 8.57*  GLUCOSE 97 98 124*   Electrolytes  Recent Labs Lab 04/01/15 0930 04/02/15 0515 04/02/15 1616 04/03/15 0530  CALCIUM 8.3* 8.2* 8.7* 8.8*  MG 2.2 2.1  --  2.2  PHOS 8.3* 4.3 2.9 4.3   CBC  Recent Labs Lab 04/01/15 0930 04/02/15 0500 04/03/15 0440  WBC 5.7 7.4 10.1  HGB 9.8* 9.1* 10.2*  HCT 33.5* 31.2* 32.9*  PLT 135* 153 158   Coag's  Recent Labs Lab 03/29/15 1745 04/03/15 0440  APTT 26 42*   INR 1.23  --    Sepsis Markers  Recent Labs Lab 03/29/15 1231 03/29/15 1745 04/01/15 0930 04/03/15 0440  LATICACIDVEN 1.36 1.2  --   --   PROCALCITON  --  2.34 2.57 1.77   ABG  Recent Labs Lab 03/30/15 0355 04/01/15 2135 04/02/15 0341  PHART 7.558* 6.979* 7.412  PCO2ART 22.8* 151* 41.6  PO2ART 54.2* 199* 64.0*   Liver Enzymes  Recent Labs Lab 03/29/15 1205  04/02/15 0515 04/02/15 1616 04/03/15 0530  AST 22  --   --   --   --   ALT 15*  --   --   --   --   ALKPHOS 52  --   --   --   --   BILITOT 0.7  --   --   --   --   ALBUMIN 4.0  < > 3.3* 3.3* 3.2*  < > = values in this interval not displayed.  Glucose  Recent Labs Lab 03/27/15 1142 03/29/15 1139 03/29/15 1931 04/02/15 0746  GLUCAP 105* 92 118* 91    Imaging Ct Soft Tissue Neck W Contrast  04/02/2015  CLINICAL DATA:  Acute respiratory failure. Cardiac arrest. End-stage renal disease. Difficult intubation. Large epiglottis is reported. EXAM: CT NECK WITH CONTRAST TECHNIQUE: Multidetector CT imaging of the neck was performed using the  standard protocol following the bolus administration of intravenous contrast. CONTRAST:  80mL OMNIPAQUE IOHEXOL 300 MG/ML  SOLN COMPARISON:  None. FINDINGS: An endotracheal tube and an orogastric tube have been placed. The endotracheal tube appears to be in good position. The orogastric tube is in the esophagus. The pharyngeal soft tissues are diffusely swollen. There is no parapharyngeal inflammatory process, but the presence or absence of epiglottitis or mucosal lesion is not established. Difficult to confirm or exclude tonsillar or adenoidal enlargement. The endotracheal tube crosses the larynx, and there is no visible laryngeal abnormality. No pathologic adenopathy is observed. BILATERAL internal jugular catheters are noted, likely a dialysis catheter on the RIGHT and a central venous line on the LEFT. No pneumothorax is evident. CT chest reported separately. Layering fluid is  seen in the sphenoid sinus and LEFT maxillary sinus. No visible orbital abnormality. Major and minor salivary glands appear unremarkable. Cervical spondylosis but no osseous finding of acuity. IMPRESSION: Post intubation findings as described. The pharyngeal soft tissues are diffusely swollen but this could reflect recent intubation; there is no visible discrete inflammatory process in the pharynx or in the parapharyngeal space. Electronically Signed   By: Elsie Stain M.D.   On: 04/02/2015 17:10   Ct Chest W Contrast  04/02/2015  CLINICAL DATA:  Acute respiratory failure. Question pneumonia. History of end-stage renal disease. EXAM: CT CHEST WITH CONTRAST TECHNIQUE: Multidetector CT imaging of the chest was performed during intravenous contrast administration. CONTRAST:  80 mL OMNIPAQUE IOHEXOL 300 MG/ML  SOLN COMPARISON:  Multiple prior plain films of the chest, last 03/31/2015, 04/01/2015 and 04/02/2015 FINDINGS: Endotracheal tube and right IJ catheter are seen and in good position. NG tube is also in place coursing into the stomach. The tip is below the inferior margin of the exam. There is cardiomegaly. No pericardial pleural effusion. No axillary, hilar or mediastinal lymphadenopathy. The lungs demonstrate dependent airspace disease bilaterally, worse on the right. A few air bronchograms are seen in the lower lobes bilaterally. The lungs are otherwise unremarkable. Incidentally imaged upper abdomen shows a tiny stone within the gallbladder. Visualized intra-abdominal contents are otherwise unremarkable. IMPRESSION: Right worse than left dependent airspace disease has an appearance most suggestive of atelectasis rather than pneumonia. Marked cardiomegaly. Gallstones without evidence of cholecystitis. Electronically Signed   By: Drusilla Kanner M.D.   On: 04/02/2015 17:09   Dg Chest Port 1 View  04/03/2015  CLINICAL DATA:  Obstructive sleep apnea, respiratory failure, acute encephalopathy, end-stage  renal disease. EXAM: PORTABLE CHEST 1 VIEW COMPARISON:  Portable chest x-ray and chest CT scan of 2017. FINDINGS: The lungs remain hypoinflated. Confluent alveolar opacities persist bilaterally. The cardiac silhouette remains enlarged. The pulmonary vascularity remains engorged and indistinct. The endotracheal tube tip lies 3.9 cm above the carina. The esophagogastric tube tip projects below the inferior margin of the image. The dialysis catheter on the right has its tip projecting over the distal third of the SVC. The left internal jugular venous catheter tip projects over the middle third of the SVC. IMPRESSION: Congestive heart failure with pulmonary interstitial and alveolar edema. Mild stable atelectasis at both lung bases. Electronically Signed   By: David  [place] M.D.   On: 04/03/2015 07:57   STUDIES:  CT chest 1/12 >> Bibasilar atelectasis CT neck 1/12>>> edema, no mass  CULTURES: 1/8 Blood x 2>> 1/8 Urine >> 1/8 Sputum >>  ANTIBIOTICS: 1/8 vanc>> 1/8 zoysn>>  SIGNIFICANT EVENTS: 1/6 Discharge from [facility] following intubation 1/8 Admitted, intubated 1/9 Extubated 1/11 Re-intubated  1/13-- hypothermia on cvvhd  LINES/TUBES: 1/8 ETT>>01/09>>>1/11>>> LIJ CVL 1/11 >>  DISCUSSION: 55 yo MO AAF non compliant with HD and Cpap and re presents with hypercarbic and hypoxic resp failure  ASSESSMENT / PLAN:  PULMONARY A: VDRF/Hypercarbic respiratory failure Etiology: OHS/OSA, overload on CT, minor atx, do we have his dry weight correct? Very difficult airway required bronchoscopy for intubation by PCCM P:   Full vent support Wean daily HD per renal abg reviewed, rate to 20 Neg to dry weight Would trach next week once hemodynamics improve  CARDIOVASCULAR A:  HTN Pulmonary HTN MVR Shock, unknown etiology Chronic Combined CHF P:  Follow CVP; 11 this AM Levophed, Vaso for MAP < 60 Hold antihypertensives May need swan  RENAL A:   ESRD, RIJ tunneled cath Hyperkalemia;  resolved P:   CVVHD per renal; even goals BMP in AM  GASTROINTESTINAL A:   GI PPx P:   Protonix Start tube feeds  HEMATOLOGIC A:   Anemia of chronic illness DVT PPx P:  CBC in AM Heparin [place]  INFECTIOUS A:   Left antecubital wound  Pneumonia neg No infection noted P:   Discontinue Vanc  dc Zosyn Follow cultures  ENDOCRINE A:   No acute issues P:   Glucose on BMP  NEUROLOGIC A:   Encephalopathy, hypercarbia P:   RASS goal: -2 Hold home clonazepam and amitriptyline for now Versed and fentanyl drip Daily WUA  FAMILY  - Updates: None at bedside    Lauris Chroman, MD PGY-3, Internal Medicine Pager: [phone]  04/03/2015 9:40 AM  STAFF NOTE: Cindi Carbon, MD FACP have personally reviewed patient's available data, including medical history, events of note, physical examination and test results as part of my evaluation. I have discussed with resident/NP and other care providers such as pharmacist, RN and RRT. In addition, I personally evaluated patient and elicited key findings of: remains on vent,coarse BS, CT chest and neck very unimpressive, no PNA, does have edema, on cvvhd to neg balance, his resp failure was very acute and dramatic, he requires trach once BP better, would push neg balance on cvvhd, re assess pcxr in am, rate to 20, SBT attempt, neck ct neg, no infection noted, dc abx, pct slight up from esrd as no sig rise trend The patient is critically ill with multiple organ systems failure and requires high complexity decision making for assessment and support, frequent evaluation and titration of therapies, application of advanced monitoring technologies and extensive interpretation of multiple databases.   Critical Care Time devoted to patient care services described in this note is 30  Minutes. This time reflects time of care of this signee: Rory Percy, MD FACP. This critical care time does not reflect procedure time, or teaching time or  supervisory time of PA/NP/Med student/Med Resident etc but could involve care discussion time. Rest per NP/medical resident whose note is outlined above and that I agree with   Mcarthur Rossetti. Tyson Alias, MD, FACP Pgr: [phone] [facility] 04/03/2015 11:39 AM

## 2015-04-03 NOTE — Progress Notes (Signed)
Physical Therapy Discharge Patient Details Name: Gary Rivera MRN: [id number] DOB: [birth year] Today's Date: 04/03/2015 Time:  -     Patient discharged from PT services secondary to medical decline - will need to re-order PT to resume therapy services.  Please see latest therapy progress note for current level of functioning and progress toward goals.    Progress and discharge plan discussed with patient and/or caregiver: Patient/Caregiver agrees with plan  GP     Tawni Millers F 04/03/2015, 1:04 PM [facility] Acute Rehabilitation [phone] (pager)

## 2015-04-03 NOTE — Progress Notes (Addendum)
[facility] Progress Note  Assessment: 1. Acute on chronic resp failure - chest CT w/o any signs of lung edema. Bibasilar atx vs PNA and very small lung volumes. CVP 15 > 11 after 1 liter net neg on CRRT yest. CXR's poor quality, vasc crowding. Not sure that we should be removing more volume, will keep even until CCM has assessed.  2. ESRD on CRRT now 3. Anemia - continue ESA dose Aranesp 1/12 Hgb 9.8 -  4. 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 P ok 5. Hypotension - on pressors 6. Bivent CHF - (LV 30-35%, RV mod dysfxn) 8. Depression - current meds 9. Left antecubital wound - seen by VVS 12/15 - has intermittently been on Vanc with wet to dry dressings  Plan - cont CRRT, keep even for now. Adding heparin for filter clotting. CCM advice for volume.     Subjective:  CT chest w/o edema, bibasilar atx vs PNA. 1 L net neg yest w CRRT.   Objective Filed Vitals:   04/03/15 0600 04/03/15 0700 04/03/15 0800 04/03/15 0856  BP: 116/60 107/56 91/59   Pulse: 57 57 57   Temp:   97.7 F (36.5 C)   TempSrc:   Oral   Resp: Height:      Weight:      SpO2: 92% 92% 91% 92%   Physical Exam General: on vent sedated Heart:RRR Lungs: occ rhonchi Abdomen: obese soft Extremities: no sig LE edema Dialysis Access: right IJ TDC left upper AVF  Dialysis Orders: [facility] 4.5h 135g 2/3.5 bath R IJ cath Hep 4200 Had been leaving below edw - last tmt prior to adm 12/31- EDW lowered to 135 after admission earlier this month Hect 2 ug  pth 849 Mircera 75 q 2wks 12/27  Midodrine 10 tiw pre HD  Additional Objective Labs: Basic Metabolic Panel:  Recent Labs Lab 04/02/15 0515 04/02/15 1616 04/03/15 0530  NA 141 141 139  K 3.8 3.6 3.9  CL 100* 99* 99*  CO2 GLUCOSE 97 98 124*  BUN 35* 41* 31*  CREATININE 11.06* 12.06* 8.57*  CALCIUM 8.2* 8.7* 8.8*  PHOS 4.3 2.9 4.3   Liver Function Tests:  Recent Labs Lab 03/29/15 1205   04/02/15 0515 04/02/15 1616 04/03/15 0530  AST 22  --   --   --   --   ALT 15*  --   --   --   --   ALKPHOS 52  --   --   --   --   BILITOT 0.7  --   --   --   --   PROT 7.5  --   --   --   --   ALBUMIN 4.0  < > 3.3* 3.3* 3.2*  < > = values in this interval not displayed. CBC:  Recent Labs Lab 03/30/15 0347 03/31/15 0505 04/01/15 0930 04/02/15 0500 04/03/15 0440  WBC 5.1 6.3 5.7 7.4 10.1  NEUTROABS  --   --  4.2 4.9 8.8*  HGB 9.0* 8.9* 9.8* 9.1* 10.2*  HCT 29.1* 31.2* 33.5* 31.2* 32.9*  MCV 92.1 98.4 97.7 94.8 91.1  PLT 130* 135* 135* 153 158   Blood Culture    Component Value Date/Time   SDES BLOOD RIGHT ANTECUBITAL 03/29/2015 1242   SPECREQUEST BOTTLES DRAWN AEROBIC AND ANAEROBIC 3CCS 03/29/2015 1242   CULT NO GROWTH 4 DAYS 03/29/2015 1242   REPTSTATUS  PENDING 03/29/2015 1242   CBG:  Recent Labs Lab 03/27/15 1142 03/29/15 1139 03/29/15 1931 04/02/15 0746  GLUCAP 105* 92 118* 91   Medications: . fentaNYL infusion INTRAVENOUS 350 mcg/hr (04/03/15 5366)  . midazolam (VERSED) infusion 10 mg/hr (04/03/15 0457)  . norepinephrine (LEVOPHED) Adult infusion 15 mcg/min (04/03/15 0653)  . dialysis replacement fluid (prismasate) 400 mL/hr at 04/03/15 4403  . dialysis replacement fluid (prismasate) 200 mL/hr at 04/02/15 1718  . dialysate (PRISMASATE) 2,000 mL/hr at 04/03/15 0653  . vasopressin (PITRESSIN) infusion - *FOR SHOCK*     . amitriptyline  25 mg Oral QHS  . antiseptic oral rinse  7 mL Mouth Rinse 10 times per day  . chlorhexidine gluconate  15 mL Mouth Rinse BID  . darbepoetin (ARANESP) injection - DIALYSIS  60 mcg Intravenous Q Thu-HD  . doxercalciferol  3 mcg Intravenous Q T,Th,Sa-HD  . feeding supplement (NEPRO CARB STEADY)  237 mL Oral BID BM  . heparin  5,000 Units Subcutaneous 3 times per day  . hydrocortisone sod succinate (SOLU-CORTEF) inj  50 mg Intravenous Q6H  . ipratropium-albuterol  3 mL Nebulization QID  . lanthanum  2,000 mg Oral TID WC  .  midazolam  2 mg Intravenous Once  . multivitamin  1 tablet Oral QHS  . pantoprazole sodium  40 mg Per Tube QHS  . piperacillin-tazobactam (ZOSYN)  IV  3.375 g Intravenous 4 times per day  . Racepinephrine HCl  0.5 mL Nebulization Once  . scopolamine  1 patch Transdermal Q72H  . vancomycin  1,250 mg Intravenous Q24H

## 2015-04-03 NOTE — Progress Notes (Signed)
PT Cancellation Note  Patient Details Name: Gary Rivera MRN: [id number] DOB: Oct 18, 1960   Cancelled Treatment:    Reason Eval/Treat Not Completed: Medical issues which prohibited therapy (sign off per nurse due to decline.  )   Beonca Gibb F 04/03/2015, 1:03 PM  Brendon Christoffel,PT Acute Rehabilitation [phone] (pager)

## 2015-04-03 NOTE — Progress Notes (Signed)
Pt failed wean attempt due to RR increased to 56. Pt placed back on full vent support. RN notified.

## 2015-04-03 NOTE — Progress Notes (Signed)
eLink Physician-Brief Progress Note Patient Name: Gary Rivera DOB: [birth year] MRN: [id number]   Date of Service  04/03/2015  HPI/Events of Note  Persistent hypothemia despite warmer on CRRT and warm blankets  eICU Interventions  Apply warming blanket as needed     Intervention Category Intermediate Interventions: Other:  DETERDING,ELIZABETH 04/03/2015, 1:15 AM

## 2015-04-04 ENCOUNTER — Inpatient Hospital Stay (HOSPITAL_COMMUNITY): Payer: Medicare Other

## 2015-04-04 LAB — RENAL FUNCTION PANEL
ALBUMIN: 3.3 g/dL — AB (ref 3.5–5.0)
ALBUMIN: 3.2 g/dL — AB (ref 3.5–5.0)
ANION GAP: 15 (ref 5–15)
Anion gap: 12 (ref 5–15)
BUN: 35 mg/dL — ABNORMAL HIGH (ref 6–20)
BUN: 34 mg/dL — AB (ref 6–20)
CALCIUM: 8.9 mg/dL (ref 8.9–10.3)
CO2: 26 mmol/L (ref 22–32)
CO2: 25 mmol/L (ref 22–32)
CREATININE: 4.46 mg/dL — AB (ref 0.61–1.24)
Calcium: 9.2 mg/dL (ref 8.9–10.3)
Chloride: 100 mmol/L — ABNORMAL LOW (ref 101–111)
Chloride: 101 mmol/L (ref 101–111)
Creatinine, Ser: 5.95 mg/dL — ABNORMAL HIGH (ref 0.61–1.24)
GFR, EST AFRICAN AMERICAN: 11 mL/min — AB (ref >60)
GFR, EST AFRICAN AMERICAN: 16 mL/min — AB (ref >60)
GFR, EST NON AFRICAN AMERICAN: 10 mL/min — AB (ref >60)
GFR, EST NON AFRICAN AMERICAN: 14 mL/min — AB (ref >60)
Glucose, Bld: 130 mg/dL — ABNORMAL HIGH (ref 65–99)
Glucose, Bld: 127 mg/dL — ABNORMAL HIGH (ref 65–99)
PHOSPHORUS: 3.2 mg/dL (ref 2.5–4.6)
POTASSIUM: 3.9 mmol/L (ref 3.5–5.1)
Phosphorus: 2.5 mg/dL (ref 2.5–4.6)
Potassium: 3.8 mmol/L (ref 3.5–5.1)
SODIUM: 138 mmol/L (ref 135–145)
Sodium: 141 mmol/L (ref 135–145)

## 2015-04-04 LAB — GLUCOSE, CAPILLARY
GLUCOSE-CAPILLARY: 111 mg/dL — AB (ref 65–99)
GLUCOSE-CAPILLARY: 111 mg/dL — AB (ref 65–99)
GLUCOSE-CAPILLARY: 112 mg/dL — AB (ref 65–99)
GLUCOSE-CAPILLARY: 123 mg/dL — AB (ref 65–99)
Glucose-Capillary: 122 mg/dL — ABNORMAL HIGH (ref 65–99)
Glucose-Capillary: 99 mg/dL (ref 65–99)

## 2015-04-04 LAB — POCT ACTIVATED CLOTTING TIME
ACTIVATED CLOTTING TIME: 157 s
ACTIVATED CLOTTING TIME: 157 s
ACTIVATED CLOTTING TIME: 167 s
ACTIVATED CLOTTING TIME: 173 s
ACTIVATED CLOTTING TIME: 178 s
ACTIVATED CLOTTING TIME: 178 s
ACTIVATED CLOTTING TIME: 183 s
ACTIVATED CLOTTING TIME: 198 s
Activated Clotting Time: 152 seconds
Activated Clotting Time: 147 seconds
Activated Clotting Time: 162 seconds
Activated Clotting Time: 152 seconds
Activated Clotting Time: 173 seconds
Activated Clotting Time: 162 seconds
Activated Clotting Time: 157 seconds
Activated Clotting Time: 167 seconds
Activated Clotting Time: 168 seconds
Activated Clotting Time: 178 seconds
Activated Clotting Time: 183 seconds
Activated Clotting Time: 188 seconds
Activated Clotting Time: 188 seconds
Activated Clotting Time: 183 seconds
Activated Clotting Time: 178 seconds
Activated Clotting Time: 183 seconds
Activated Clotting Time: 188 seconds
Activated Clotting Time: 188 seconds

## 2015-04-04 LAB — APTT: aPTT: 35 seconds (ref 24–37)

## 2015-04-04 LAB — MAGNESIUM: Magnesium: 2.5 mg/dL — ABNORMAL HIGH (ref 1.7–2.4)

## 2015-04-04 MED ORDER — SCOPOLAMINE 1 MG/3DAYS TD PT72
1.0000 | MEDICATED_PATCH | TRANSDERMAL | Status: DC
  Administered 2015-04-04: 1.5 mg via TRANSDERMAL
  Filled 2015-04-04: qty 1

## 2015-04-04 MED ORDER — ALBUTEROL SULFATE (2.5 MG/3ML) 0.083% IN NEBU: 2.5000 mg | INHALATION_SOLUTION | RESPIRATORY_TRACT | Status: DC | PRN

## 2015-04-04 MED ORDER — CLONAZEPAM 0.5 MG PO TABS
0.2500 mg | ORAL_TABLET | Freq: Two times a day (BID) | ORAL | Status: DC | PRN
  Administered 2015-04-04 – 2015-04-05 (×3): 0.25 mg via ORAL
  Filled 2015-04-04 (×3): qty 1

## 2015-04-04 NOTE — Progress Notes (Signed)
25 cc Midazolam drip ( 1 mg per mL) wasted in sink followed by water flush by 2 RN Deneise Lever and Carlton Adam

## 2015-04-04 NOTE — Progress Notes (Signed)
[facility] Progress Note  Assessment: 1 A/C resp failure - no pulm edema by CXR/ CT. Upper airway issues, OHS +/- bibasilar PNA vs atx. On vent, per CCM.   2 ESRD on CRRT now. Under dry wt 1kg. No vol excess on exam/ CXR etc. On heparin now for filter patency, max 1880 units / hr. PTT ok today and less filter clotting on heparin. 3 Shock - on levo gtt 4 MBD increased hect to 3 ug, dec'd Ca bath to 2.5 form 3.5. P ok 5 Anemia - continue ESA dose Aranesp 6 Bivent HF LVEF 30-35%, RV dysfxn w pulm htn 7 Depression - current meds 8 Left antecubital wound - seen by VVS 12/15 - has intermittently been on Vanc with wet to dry dressings  Anemia - continue ESA dose Aranesp 1/12 Hgb 9.8 -   Plan -  Cont CRRT, keeping even. I dont' think he has lung edema, have d/w CCM they will take a look.     Subjective:  CXR clear today in upright position, keeping even.  Failed attempt to wean.   Objective Filed Vitals:   04/04/15 1100 04/04/15 1115 04/04/15 1130 04/04/15 1134  BP: 116/66 128/65 119/61   Pulse: 74 66 67   Temp:    97.8 F (36.6 C)  TempSrc:    Axillary  Resp: 20 20 21    Height:      Weight:      SpO2: 97% 95% 97%    Physical Exam General: on vent sedated Heart:RRR Lungs: occ rhonchi Abdomen: obese soft Extremities: no sig LE edema Dialysis Access: right IJ TDC left upper AVF  Dialysis Orders: [facility] 4.5h 135g 2/3.5 bath R IJ cath Hep 4200 Had been leaving below edw - last tmt prior to adm 12/31- EDW lowered to 135 after admission earlier this month Hect 2 ug  pth 849 Mircera 75 q 2wks 12/27  Midodrine 10 tiw pre HD  Additional Objective Labs: Basic Metabolic Panel:  Recent Labs Lab 04/03/15 0530 04/03/15 2052 04/04/15 0401  NA 139 139 141  K 3.9 4.1 3.9  CL 99* 100* 100*  CO2 23 24 26   GLUCOSE 124* 150* 130*  BUN 31* 30* 35*  CREATININE 8.57* 6.37* 5.95*  CALCIUM 8.8* 9.3 9.2  PHOS 4.3 3.5 3.2   Liver Function Tests:  Recent  Labs Lab 03/29/15 1205  04/03/15 0530 04/03/15 2052 04/04/15 0401  AST 22  --   --   --   --   ALT 15*  --   --   --   --   ALKPHOS 52  --   --   --   --   BILITOT 0.7  --   --   --   --   PROT 7.5  --   --   --   --   ALBUMIN 4.0  < > 3.2* 3.3* 3.3*  < > = values in this interval not displayed. CBC:  Recent Labs Lab 03/30/15 0347 03/31/15 0505 04/01/15 0930 04/02/15 0500 04/03/15 0440  WBC 5.1 6.3 5.7 7.4 10.1  NEUTROABS  --   --  4.2 4.9 8.8*  HGB 9.0* 8.9* 9.8* 9.1* 10.2*  HCT 29.1* 31.2* 33.5* 31.2* 32.9*  MCV 92.1 98.4 97.7 94.8 91.1  PLT 130* 135* 135* 153 158   Blood Culture    Component Value Date/Time   SDES TRACHEAL ASPIRATE 04/02/2015 0015   SPECREQUEST Normal 04/02/2015 0015   CULT PENDING 04/02/2015 0015  REPTSTATUS PENDING 04/02/2015 0015   CBG:  Recent Labs Lab 04/03/15 1651 04/03/15 1950 04/04/15 0002 04/04/15 0359 04/04/15 0804  GLUCAP 133* 130* 123* 112* 111*   Medications: . sodium chloride 10 mL/hr at 04/04/15 0730  . fentaNYL infusion INTRAVENOUS Stopped (04/04/15 0730)  . heparin 10,000 units/ 20 mL infusion syringe 1,800 Units/hr (04/04/15 1005)  . midazolam (VERSED) infusion Stopped (04/04/15 0730)  . norepinephrine (LEVOPHED) Adult infusion 10 mcg/min (04/04/15 1130)  . dialysis replacement fluid (prismasate) 400 mL/hr at 04/04/15 0012  . dialysis replacement fluid (prismasate) 200 mL/hr at 04/04/15 0645  . dialysate (PRISMASATE) 2,000 mL/hr at 04/04/15 1134   . amitriptyline  25 mg Oral QHS  . antiseptic oral rinse  7 mL Mouth Rinse 10 times per day  . chlorhexidine gluconate  15 mL Mouth Rinse BID  . darbepoetin (ARANESP) injection - DIALYSIS  60 mcg Intravenous Q Thu-HD  . doxercalciferol  3 mcg Intravenous Q T,Th,Sa-HD  . feeding supplement (NEPRO CARB STEADY)  1,000 mL Per Tube Q24H  . feeding supplement (PRO-STAT SUGAR FREE 64)  60 mL Per Tube 5 X Daily  . heparin  5,000 Units Subcutaneous Q12H  . hydrocortisone sod  succinate (SOLU-CORTEF) inj  50 mg Intravenous Q6H  . ipratropium-albuterol  3 mL Nebulization QID  . midazolam  2 mg Intravenous Once  . multivitamin  1 tablet Oral QHS  . pantoprazole sodium  40 mg Per Tube QHS  . Racepinephrine HCl  0.5 mL Nebulization Once  . scopolamine  1 patch Transdermal Q72H

## 2015-04-04 NOTE — Progress Notes (Signed)
Pharmacy Narcotic Documentation  Extra Versed and fentanyl drips given to pharmacist by nurse. Returned to main pharmacy by myself at 0935.  Arcola Jansky, PharmD Clinical Pharmacy Resident Pager: [phone]

## 2015-04-04 NOTE — Progress Notes (Signed)
eLink Physician-Brief Progress Note Patient Name: Gary Rivera DOB: 02/02/1961 MRN: [id number]   Date of Service  04/04/2015  HPI/Events of Note  Copious oral and nasal secretions.  eICU Interventions  Will order a Scopolamine patch Q 72 hours.      Intervention Category Intermediate Interventions: Other:  [name] 04/04/2015, 8:39 PM

## 2015-04-04 NOTE — Progress Notes (Signed)
PULMONARY / CRITICAL CARE MEDICINE   Name: Gary Rivera MRN: [id number] DOB: March 24, 1960    ADMISSION DATE:  03/29/2015   REFERRING MD:  ED  CHIEF COMPLAINT:  Resp failure  SUBJECTIVE:  RN reports weaning of vasopressors - levo down from 24 to 12 mcg, increased mucus plugging, I/O +800 for entire admission. Pt requesting klonopin for anxiety.  Pt currently 1kg under dry weight (134 kg)  VITAL SIGNS: BP 102/59 mmHg  Pulse 66  Temp(Src) 97.8 F (36.6 C) (Axillary)  Resp 20  Ht 6\' 2"  (1.88 m)  Wt 295 lb 10.2 oz (134.1 kg)  BMI 37.94 kg/m2  SpO2 98%  HEMODYNAMICS: CVP:  [7 mmHg-13 mmHg] 12 mmHg  VENTILATOR SETTINGS: Vent Mode:  [-] PRVC FiO2 (%):  [40 %] 40 % Set Rate:  [20 bmp] 20 bmp Vt Set:  [640 mL] 640 mL PEEP:  [5 cmH20] 5 cmH20 Plateau Pressure:  [17 cmH20-28 cmH20] 17 cmH20  INTAKE / OUTPUT: I/O last 3 completed shifts: In: 2966.9 [I.V.:1641.9; NG/GT:975; IV Piggyback:350] Out: 5232 [Emesis/NG output:500; Other:4732]  PHYSICAL EXAMINATION: General: acutely ill appearing male, intubated, lying in bed Neuro: RASS 0, moves all extremities spontaneously, attempts to write.  HEENT:  PERRL, moist mucus membranes. ETT in place. RIJ tunneled cath, LIJ CVL.  Cardiovascular:  RRR, no murmurs, gallops, or rubs.  Lungs: Air entry equal bilaterally, coarse, no rales or wheezes.  Abdomen: obese, soft, NT, ND and +BS. Musculoskeletal:  Intact. Skin:  Left antecubital wound noted, right PIV antecubital  LABS:  BMET  Recent Labs Lab 04/03/15 0530 04/03/15 2052 04/04/15 0401  NA 139 139 141  K 3.9 4.1 3.9  CL 99* 100* 100*  CO2 23 24 26   BUN 31* 30* 35*  CREATININE 8.57* 6.37* 5.95*  GLUCOSE 124* 150* 130*   Electrolytes  Recent Labs Lab 04/02/15 0515  04/03/15 0530 04/03/15 2052 04/04/15 0401  CALCIUM 8.2*  < > 8.8* 9.3 9.2  MG 2.1  --  2.2  --  2.5*  PHOS 4.3  < > 4.3 3.5 3.2  < > = values in this interval not displayed. CBC  Recent Labs Lab  04/01/15 0930 04/02/15 0500 04/03/15 0440  WBC 5.7 7.4 10.1  HGB 9.8* 9.1* 10.2*  HCT 33.5* 31.2* 32.9*  PLT 135* 153 158   Coag's  Recent Labs Lab 03/29/15 1745 04/03/15 0440 04/04/15 0401  APTT 26 42* 35  INR 1.23  --   --    Sepsis Markers  Recent Labs Lab 03/29/15 1231 03/29/15 1745 04/01/15 0930 04/03/15 0440  LATICACIDVEN 1.36 1.2  --   --   PROCALCITON  --  2.34 2.57 1.77   ABG  Recent Labs Lab 03/30/15 0355 04/01/15 2135 04/02/15 0341  PHART 7.558* 6.979* 7.412  PCO2ART 22.8* 151* 41.6  PO2ART 54.2* 199* 64.0*   Liver Enzymes  Recent Labs Lab 03/29/15 1205  04/03/15 0530 04/03/15 2052 04/04/15 0401  AST 22  --   --   --   --   ALT 15*  --   --   --   --   ALKPHOS 52  --   --   --   --   BILITOT 0.7  --   --   --   --   ALBUMIN 4.0  < > 3.2* 3.3* 3.3*  < > = values in this interval not displayed.  Glucose  Recent Labs Lab 04/03/15 1651 04/03/15 1950 04/04/15 0002 04/04/15 0359 04/04/15 0804 04/04/15 1133  GLUCAP 133* 130* 123* 112* 111* 122*    Imaging Dg Chest Port 1 View  04/04/2015  CLINICAL DATA:  Followup exam. End-stage renal disease with respiratory failure. Intubated patient. EXAM: PORTABLE CHEST 1 VIEW COMPARISON:  04/03/2015 FINDINGS: Lung volumes remain low. Elevation of the right hemidiaphragm is stable. Mild basilar atelectasis. The hazy opacity noted in the lungs on the prior study has resolved consistent with resolved edema. Cardiac silhouette mildly enlarged.  No mediastinal or hilar masses. Endotracheal tube, nasogastric tube, left internal jugular central venous line and right internal jugular tunneled dual lumen central venous catheter are all stable from prior study. IMPRESSION: 1. Improved lung aeration. Pulmonary edema has essentially resolved. There is mild residual basilar atelectasis. 2. No new abnormalities. 3. Support apparatus is stable and well positioned. Electronically Signed   By: Amie Portland M.D.   On:  04/04/2015 10:41   STUDIES:  CT chest 1/12 >> Bibasilar atelectasis CT neck 1/12 >> artifact with movement, basilar consolidation with air bronchograms, no mass  CULTURES: 1/8 Blood x 2 >> neg 1/8 Urine >> not done 1/8 Sputum >> 1/11 RVP >> neg  ANTIBIOTICS: 1/8 vanc >> 1/12 1/8 zoysn >> 1/12  SIGNIFICANT EVENTS: 1/6 Discharge from [facility] following intubation 1/8 Admitted, intubated 1/9 Extubated 1/11 Re-intubated 1/13-- hypothermia on cvvhd  LINES/TUBES: 1/8 ETT>>01/09>>>1/11>>> LIJ CVL 1/11 >>   DISCUSSION: 55 yo MO AAF non compliant with HD and Cpap and re-presents with hypercarbic and hypoxic resp failure  ASSESSMENT / PLAN:  PULMONARY A: VDRF/Hypercarbic respiratory failure - OHS/OSA, dependent atelectasis on CT OSA / OHS  Very DIFFICULT AIRWAY required bronchoscopy for intubation by PCCM P:   MV support, 8 cc/kg  Wean PEEP / FiO2 for sats >92% Daily SBT / WUA Consider tracheostomy due to difficult intubation, recurrent failure, OSA / OHS Duoneb Q6 with Q3 PRN albuterol  D/C scopolamine patch with mucus plugging  Upright positioning as tolerated   CARDIOVASCULAR A:  HTN Pulmonary HTN MVR Shock, unknown etiology - resolving, weaning levophed 1/14 Chronic Combined CHF P:  CVP Q4  Levophed, Vaso for MAP < 60 Hold antihypertensives Even balance Stress dose steroids   RENAL A:   ESRD - on HD, non-compliant, RIJ tunneled cath Hyperkalemia; resolved P:   CVVHD per renal, goal - even balance BMP in AM Replace electrolytes as indicated   GASTROINTESTINAL A:   GI PPx P:   Protonix TF as tolerated   HEMATOLOGIC A:   Anemia of chronic illness DVT PPx P:  CBC in AM Heparin [place]  INFECTIOUS A:   Left antecubital wound  Pneumonia neg Dependent Airspace Disease - noted on CT with air bronchograms, no WBC or fever P:   Off abx since 1/12 Monitor fever curve / WBC Follow cultures  ENDOCRINE A:   No acute issues P:   Glucose on  BMP  NEUROLOGIC A:   Metabolic Encephalopathy - in setting of hypercarbia P:   RASS goal: -1 Hold home amitriptyline for now Resume home klonopin Wean versed to off  Fentanyl drip for pain  Daily WUA    FAMILY  - Updates: None at bedside, patient updated on status.      [name], NP-C [facility] Pgr: [phone] or if no answer [phone] 04/04/2015, 12:37 PM   Attending Note:  I have examined patient, reviewed labs, studies and notes. I have discussed the case with B Ollis, and I agree with the data and plans as amended above.  Molly Maduro  Delton Coombes, MD, PhD 04/04/2015, 7:02 PM [facility] [phone] or if no answer [phone]

## 2015-04-05 ENCOUNTER — Inpatient Hospital Stay (HOSPITAL_COMMUNITY): Payer: Medicare Other

## 2015-04-05 LAB — CBC
HEMATOCRIT: 32.4 % — AB (ref 39.0–52.0)
Hemoglobin: 9.9 g/dL — ABNORMAL LOW (ref 13.0–17.0)
MCH: 28.9 pg (ref 26.0–34.0)
MCHC: 30.6 g/dL (ref 30.0–36.0)
MCV: 94.5 fL (ref 78.0–100.0)
PLATELETS: 137 10*3/uL — AB (ref 150–400)
RBC: 3.43 MIL/uL — ABNORMAL LOW (ref 4.22–5.81)
RDW: 18.2 % — AB (ref 11.5–15.5)
WBC: 8.4 10*3/uL (ref 4.0–10.5)

## 2015-04-05 LAB — GLUCOSE, CAPILLARY
GLUCOSE-CAPILLARY: 75 mg/dL (ref 65–99)
Glucose-Capillary: 87 mg/dL (ref 65–99)
Glucose-Capillary: 93 mg/dL (ref 65–99)
Glucose-Capillary: 97 mg/dL (ref 65–99)
Glucose-Capillary: 99 mg/dL (ref 65–99)

## 2015-04-05 LAB — RENAL FUNCTION PANEL
Albumin: 3.1 g/dL — ABNORMAL LOW (ref 3.5–5.0)
Albumin: 3 g/dL — ABNORMAL LOW (ref 3.5–5.0)
Anion gap: 12 (ref 5–15)
Anion gap: 12 (ref 5–15)
BUN: 33 mg/dL — ABNORMAL HIGH (ref 6–20)
BUN: 32 mg/dL — ABNORMAL HIGH (ref 6–20)
CO2: 26 mmol/L (ref 22–32)
CO2: 28 mmol/L (ref 22–32)
Calcium: 9.2 mg/dL (ref 8.9–10.3)
Calcium: 9 mg/dL (ref 8.9–10.3)
Chloride: 102 mmol/L (ref 101–111)
Chloride: 100 mmol/L — ABNORMAL LOW (ref 101–111)
Creatinine, Ser: 3.68 mg/dL — ABNORMAL HIGH (ref 0.61–1.24)
Creatinine, Ser: 3.2 mg/dL — ABNORMAL HIGH (ref 0.61–1.24)
GFR calc Af Amer: 20 mL/min — ABNORMAL LOW
GFR calc Af Amer: 24 mL/min — ABNORMAL LOW
GFR calc non Af Amer: 17 mL/min — ABNORMAL LOW
GFR calc non Af Amer: 20 mL/min — ABNORMAL LOW
Glucose, Bld: 107 mg/dL — ABNORMAL HIGH (ref 65–99)
Glucose, Bld: 111 mg/dL — ABNORMAL HIGH (ref 65–99)
Phosphorus: 2.8 mg/dL (ref 2.5–4.6)
Phosphorus: 3.6 mg/dL (ref 2.5–4.6)
Potassium: 4.2 mmol/L (ref 3.5–5.1)
Potassium: 4 mmol/L (ref 3.5–5.1)
Sodium: 140 mmol/L (ref 135–145)
Sodium: 140 mmol/L (ref 135–145)

## 2015-04-05 LAB — POCT ACTIVATED CLOTTING TIME
ACTIVATED CLOTTING TIME: 193 s
ACTIVATED CLOTTING TIME: 204 s
Activated Clotting Time: 203 s
Activated Clotting Time: 198 s
Activated Clotting Time: 198 s
Activated Clotting Time: 193 s

## 2015-04-05 LAB — CULTURE, RESPIRATORY W GRAM STAIN

## 2015-04-05 LAB — CULTURE, RESPIRATORY
CULTURE: NORMAL
SPECIAL REQUESTS: NORMAL

## 2015-04-05 LAB — MAGNESIUM: Magnesium: 2.6 mg/dL — ABNORMAL HIGH (ref 1.7–2.4)

## 2015-04-05 LAB — APTT: aPTT: 140 seconds — ABNORMAL HIGH (ref 24–37)

## 2015-04-05 LAB — PROCALCITONIN: PROCALCITONIN: 1.02 ng/mL

## 2015-04-05 MED ORDER — DEXTROSE 50 % IV SOLN
25.0000 mL | Freq: Once | INTRAVENOUS | Status: AC
  Administered 2015-04-05: 25 mL via INTRAVENOUS

## 2015-04-05 MED ORDER — DEXTROSE 5 % IV SOLN
20.0000 g | INTRAVENOUS | Status: DC
  Administered 2015-04-06 – 2015-04-11 (×4): 20 g via INTRAVENOUS_CENTRAL
  Filled 2015-04-05 (×13): qty 200

## 2015-04-05 MED ORDER — DEXTROSE 5 % IV SOLN
INTRAVENOUS | Status: DC
  Administered 2015-04-05: 20:00:00 via INTRAVENOUS

## 2015-04-05 MED ORDER — SODIUM CHLORIDE 0.9 % IV SOLN
INTRAVENOUS | Status: DC
  Administered 2015-04-06 – 2015-04-11 (×70): via INTRAVENOUS_CENTRAL
  Filled 2015-04-05 (×106): qty 4.5

## 2015-04-05 MED ORDER — DEXTROSE 5 % IV SOLN
Status: DC
  Administered 2015-04-06 – 2015-04-11 (×11): via INTRAVENOUS_CENTRAL
  Filled 2015-04-05 (×20): qty 1500

## 2015-04-05 MED ORDER — DEXTROSE 50 % IV SOLN
INTRAVENOUS | Status: AC
  Administered 2015-04-05: 25 mL via INTRAVENOUS
  Filled 2015-04-05: qty 50

## 2015-04-05 NOTE — Progress Notes (Signed)
[facility] Progress Note  Assessment: 1 A/C resp failure - no pulm edema by CXR/ CT. Upper airway issues, OHS +/- bibasilar PNA vs atx by CT. On vent, per CCM. Weaning today, may need trach 2 ESRD on CRRT now. Under dry wt 1kg. No vol excess on exam/ CXR etc. Systemic PTT is now too high (140) on heparin for CRRT, will dc hep and use citrate if clotting occurs.  3 Shock - weaned off pressors this am 4 MBD increased hect to 3 ug, dec'd Ca bath to 2.5 form 3.5. P ok 5 Anemia - continue aranesp 60/wk 6 Bivent HF LVEF 30-35%, RV dysfxn w pulm htn 7 Depression - current meds 8 Left antecubital wound - seen by VVS 12/15 - has intermittently been on Vanc with wet to dry dressings  Plan -  Cont CRRT. Pull small amount 0-50 cc/hr as tolerated, don't resume pressors however.   [name] MD [facility] pager [phone]    cell [phone] 04/05/2015, 4:35 PM   Subjective:  CXR clear today in upright position, keeping even.  Failed attempt to wean.   Objective Filed Vitals:   04/05/15 1400 04/05/15 1500 04/05/15 1511 04/05/15 1621  BP: 99/61 106/67    Pulse: 64 66 65   Temp:    98 F (36.7 C)  TempSrc:    Oral  Resp: 18 23 19    Height:      Weight:      SpO2: 99% 100% 100%    Physical Exam General: on vent sedated Heart:RRR Lungs: occ rhonchi Abdomen: obese soft Extremities: no sig LE edema Dialysis Access: right IJ TDC left upper AVF  Dialysis Orders: [facility] 4.5h 135g 2/3.5 bath R IJ cath Hep 4200 Had been leaving below edw - last tmt prior to adm 12/31- EDW lowered to 135 after admission earlier this month Hect 2 ug  pth 849 Mircera 75 q 2wks 12/27  Midodrine 10 tiw pre HD  Additional Objective Labs: Basic Metabolic Panel:  Recent Labs Lab 04/04/15 0401 04/04/15 1607 04/05/15 0426  NA 141 138 140  K 3.9 3.8 4.2  CL 100* 101 102  CO2 26 25 26   GLUCOSE 130* 127* 107*  BUN 35* 34* 33*  CREATININE 5.95* 4.46* 3.68*   CALCIUM 9.2 8.9 9.2  PHOS 3.2 2.5 2.8   Liver Function Tests:  Recent Labs Lab 04/04/15 0401 04/04/15 1607 04/05/15 0426  ALBUMIN 3.3* 3.2* 3.1*   CBC:  Recent Labs Lab 03/31/15 0505 04/01/15 0930 04/02/15 0500 04/03/15 0440 04/05/15 0426  WBC 6.3 5.7 7.4 10.1 8.4  NEUTROABS  --  4.2 4.9 8.8*  --   HGB 8.9* 9.8* 9.1* 10.2* 9.9*  HCT 31.2* 33.5* 31.2* 32.9* 32.4*  MCV 98.4 97.7 94.8 91.1 94.5  PLT 135* 135* 153 158 137*   Blood Culture    Component Value Date/Time   SDES TRACHEAL ASPIRATE 04/02/2015 0015   SPECREQUEST Normal 04/02/2015 0015   CULT  04/02/2015 0015    NORMAL OROPHARYNGEAL FLORA Performed at [facility]    REPTSTATUS 04/05/2015 FINAL 04/02/2015 0015   CBG:  Recent Labs Lab 04/04/15 2016 04/05/15 0006 04/05/15 0356 04/05/15 0756 04/05/15 1116  GLUCAP 99 97 93 75 87   Medications: . sodium chloride Stopped (04/05/15 0700)  . fentaNYL infusion INTRAVENOUS 200 mcg/hr (04/05/15 1245)  . heparin 10,000 units/ 20 mL infusion syringe 1,800 Units/hr (04/05/15 1509)  . midazolam (VERSED) infusion Stopped (04/04/15 1600)  . norepinephrine (LEVOPHED) Adult  infusion Stopped (04/04/15 1715)  . dialysis replacement fluid (prismasate) 400 mL/hr at 04/05/15 0337  . dialysis replacement fluid (prismasate) 200 mL/hr at 04/05/15 0806  . dialysate (PRISMASATE) 2,000 mL/hr at 04/05/15 1504   . amitriptyline  25 mg Oral QHS  . antiseptic oral rinse  7 mL Mouth Rinse 10 times per day  . chlorhexidine gluconate  15 mL Mouth Rinse BID  . darbepoetin (ARANESP) injection - DIALYSIS  60 mcg Intravenous Q Thu-HD  . doxercalciferol  3 mcg Intravenous Q T,Th,Sa-HD  . feeding supplement (NEPRO CARB STEADY)  1,000 mL Per Tube Q24H  . feeding supplement (PRO-STAT SUGAR FREE 64)  60 mL Per Tube 5 X Daily  . heparin  5,000 Units Subcutaneous Q12H  . hydrocortisone sod succinate (SOLU-CORTEF) inj  50 mg Intravenous Q6H  . ipratropium-albuterol  3 mL  Nebulization QID  . multivitamin  1 tablet Oral QHS  . pantoprazole sodium  40 mg Per Tube QHS

## 2015-04-05 NOTE — Progress Notes (Addendum)
PULMONARY / CRITICAL CARE MEDICINE   Name: Gary Rivera MRN: [id number] DOB: 06-14-1960    ADMISSION DATE:  03/29/2015   REFERRING MD:  ED  CHIEF COMPLAINT:  Resp failure  SUBJECTIVE:   Awake and interacting Scopolamine was restarted 1/14 for oral secretions.   VITAL SIGNS: BP 114/62 mmHg  Pulse 70  Temp(Src) 97.7 F (36.5 C) (Oral)  Resp 18  Ht  (1.88 m)  Wt 133.3 kg (293 lb 14 oz)  BMI 37.72 kg/m2  SpO2 100%  HEMODYNAMICS: CVP:  [9 mmHg-12 mmHg] 9 mmHg  VENTILATOR SETTINGS: Vent Mode:  [-] PRVC FiO2 (%):  [40 %] 40 % Set Rate:  [20 bmp] 20 bmp Vt Set:  [640 mL-650 mL] 650 mL PEEP:  [5 cmH20] 5 cmH20 Plateau Pressure:  [23 cmH20-30 cmH20] 23 cmH20  INTAKE / OUTPUT: I/O last 3 completed shifts: In: 2363.4 [I.V.:1068.4; NG/GT:1295] Out: 2560 [Other:2560]  PHYSICAL EXAMINATION: General: acutely ill appearing male, intubated, lying in bed Neuro: RASS 0, moves all extremities spontaneously, attempts to write.  HEENT:  PERRL, moist mucus membranes. ETT in place. RIJ tunneled cath, LIJ CVL.  Cardiovascular:  RRR, no murmurs, gallops, or rubs.  Lungs: Air entry equal bilaterally, coarse, no rales or wheezes.  Abdomen: obese, soft, NT, ND and +BS. Musculoskeletal:  Intact. Skin:  Left antecubital wound noted, right PIV antecubital  LABS:  BMET  Recent Labs Lab 04/04/15 0401 04/04/15 1607 04/05/15 0426  NA 141 138 140  K 3.9 3.8 4.2  CL 100* 101 102  CO2 BUN 35* 34* 33*  CREATININE 5.95* 4.46* 3.68*  GLUCOSE 130* 127* 107*   Electrolytes  Recent Labs Lab 04/03/15 0530  04/04/15 0401 04/04/15 1607 04/05/15 0426  CALCIUM 8.8*  < > 9.2 8.9 9.2  MG 2.2  --  2.5*  --  2.6*  PHOS 4.3  < > 3.2 2.5 2.8  < > = values in this interval not displayed. CBC  Recent Labs Lab 04/02/15 0500 04/03/15 0440 04/05/15 0426  WBC 7.4 10.1 8.4  HGB 9.1* 10.2* 9.9*  HCT 31.2* 32.9* 32.4*  PLT 153 158 137*   Coag's  Recent Labs Lab  03/29/15 1745 04/03/15 0440 04/04/15 0401 04/05/15 0426  APTT 26 42* 35 140*  INR 1.23  --   --   --    Sepsis Markers  Recent Labs Lab 03/29/15 1231  03/29/15 1745 04/01/15 0930 04/03/15 0440 04/05/15 0426  LATICACIDVEN 1.36  --  1.2  --   --   --   PROCALCITON  --   < > 2.34 2.57 1.77 1.02  < > = values in this interval not displayed. ABG  Recent Labs Lab 03/30/15 0355 04/01/15 2135 04/02/15 0341  PHART 7.558* 6.979* 7.412  PCO2ART 22.8* 151* 41.6  PO2ART 54.2* 199* 64.0*   Liver Enzymes  Recent Labs Lab 03/29/15 1205  04/04/15 0401 04/04/15 1607 04/05/15 0426  AST 22  --   --   --   --   ALT 15*  --   --   --   --   ALKPHOS 52  --   --   --   --   BILITOT 0.7  --   --   --   --   ALBUMIN 4.0  < > 3.3* 3.2* 3.1*  < > = values in this interval not displayed.  Glucose  Recent Labs Lab 04/04/15 1133 04/04/15 1639 04/04/15 2016 04/05/15 0006 04/05/15 0356 04/05/15  0756  GLUCAP 122* 111* 99 97 93 75    Imaging Dg Chest Port 1 View  04/04/2015  CLINICAL DATA:  Followup exam. End-stage renal disease with respiratory failure. Intubated patient. EXAM: PORTABLE CHEST 1 VIEW COMPARISON:  04/03/2015 FINDINGS: Lung volumes remain low. Elevation of the right hemidiaphragm is stable. Mild basilar atelectasis. The hazy opacity noted in the lungs on the prior study has resolved consistent with resolved edema. Cardiac silhouette mildly enlarged.  No mediastinal or hilar masses. Endotracheal tube, nasogastric tube, left internal jugular central venous line and right internal jugular tunneled dual lumen central venous catheter are all stable from prior study. IMPRESSION: 1. Improved lung aeration. Pulmonary edema has essentially resolved. There is mild residual basilar atelectasis. 2. No new abnormalities. 3. Support apparatus is stable and well positioned. Electronically Signed   By: Amie Portland M.D.   On: 04/04/2015 10:41   STUDIES:  CT chest 1/12 >> Bibasilar  atelectasis CT neck 1/12 >> artifact with movement, basilar consolidation with air bronchograms, no mass  CULTURES: 1/8 Blood x 2 >> neg 1/8 Urine >> not done 1/8 Sputum >> 1/11 RVP >> neg  ANTIBIOTICS: 1/8 vanc >> 1/12 1/8 zoysn >> 1/12  SIGNIFICANT EVENTS: 1/6 Discharge from [facility] following intubation 1/8 Admitted, intubated 1/9 Extubated 1/11 Re-intubated 1/13-- hypothermia on cvvhd  LINES/TUBES: 1/8 ETT>>01/09>>>1/11>>> LIJ CVL 1/11 >>   DISCUSSION: 55 yo MO AAF non compliant with HD and Cpap and re-presents with hypercarbic and hypoxic resp failure  ASSESSMENT / PLAN:  PULMONARY A: VDRF/Hypercarbic respiratory failure - OHS/OSA, dependent atelectasis on CT OSA / OHS  Very DIFFICULT AIRWAY required bronchoscopy for intubation by PCCM P:   MV support, 8 cc/kg  Wean PEEP / FiO2 for sats >92% Daily SBT / WUA Consider tracheostomy due to difficult intubation, recurrent failure, OSA / OHS. Given his improvement I would favor trying to extubate again before committing to trach Duoneb Q6 with Q3 PRN albuterol  D/C scopolamine patch again 1/15, with mucus plugging  Upright positioning as tolerated   CARDIOVASCULAR A:  HTN Pulmonary HTN MVR Shock, unknown etiology - resolving, weaning levophed 1/14 Chronic Combined CHF P:  CVP Q4  Levophed is off Hold antihypertensives Even balance Stress dose steroids   RENAL A:   ESRD - on HD, non-compliant, RIJ tunneled cath Hyperkalemia; resolved P:   CVVHD per renal, goal - even balance; can likely change to intermittent HD Follow BMP Replace electrolytes as indicated   GASTROINTESTINAL A:   GI PPx P:   Protonix TF as tolerated   HEMATOLOGIC A:   Anemia of chronic illness DVT PPx P:  CBC in AM Heparin [place]  INFECTIOUS A:   Left antecubital wound  Pneumonia neg Dependent Airspace Disease - noted on CT with air bronchograms, no WBC or fever P:   Off abx since 1/12 Monitor fever curve / WBC Follow  cultures  ENDOCRINE A:   No acute issues P:   Glucose on BMP  NEUROLOGIC A:   Metabolic Encephalopathy - in setting of hypercarbia P:   RASS goal: -1 Home amitriptyline  Resume home klonopin on 1/14 Weaned versed to off  Fentanyl drip for pain  Daily WUA    FAMILY  - Updates: None at bedside, patient updated on status.    Independent CC time 35 minutes  Levy Pupa, MD, PhD 04/05/2015, 9:00 AM [facility] [phone] or if no answer [phone]

## 2015-04-05 NOTE — Progress Notes (Signed)
Pt placed on C/PS per MD.  PS increased to 10 due to decreased tidal volumes.  Pt tolerating well at this time.  RT will continue to monitor.  

## 2015-04-05 NOTE — Progress Notes (Signed)
eLink Physician-Brief Progress Note Patient Name: Gary Rivera DOB: 05/21/1960 MRN: [id number]   Date of Service  04/05/2015  HPI/Events of Note  Hypoglycemia - Blood glucose = 69.   eICU Interventions  Will order D5W to run IV at 40 mL/hour.     Intervention Category Intermediate Interventions: Other:  [name] 04/05/2015, 7:58 PM

## 2015-04-06 ENCOUNTER — Inpatient Hospital Stay (HOSPITAL_COMMUNITY): Payer: Medicare Other

## 2015-04-06 DIAGNOSIS — R579 Shock, unspecified: Secondary | ICD-10-CM

## 2015-04-06 LAB — POCT I-STAT, CHEM 8
BUN: 28 mg/dL — AB (ref 6–20)
BUN: 38 mg/dL — AB (ref 6–20)
BUN: 28 mg/dL — AB (ref 6–20)
BUN: 37 mg/dL — AB (ref 6–20)
BUN: 28 mg/dL — ABNORMAL HIGH (ref 6–20)
BUN: 37 mg/dL — AB (ref 6–20)
BUN: 29 mg/dL — ABNORMAL HIGH (ref 6–20)
BUN: 36 mg/dL — AB (ref 6–20)
BUN: 27 mg/dL — ABNORMAL HIGH (ref 6–20)
BUN: 35 mg/dL — ABNORMAL HIGH (ref 6–20)
BUN: 29 mg/dL — ABNORMAL HIGH (ref 6–20)
BUN: 35 mg/dL — AB (ref 6–20)
CALCIUM ION: 0.45 mmol/L — AB (ref 1.12–1.23)
CALCIUM ION: 1.08 mmol/L — AB (ref 1.12–1.23)
CALCIUM ION: 0.42 mmol/L — AB (ref 1.12–1.23)
CALCIUM ION: 1.1 mmol/L — AB (ref 1.12–1.23)
CALCIUM ION: 1.07 mmol/L — AB (ref 1.12–1.23)
CALCIUM ION: 0.5 mmol/L — AB (ref 1.12–1.23)
CALCIUM ION: 1.09 mmol/L — AB (ref 1.12–1.23)
CHLORIDE: 106 mmol/L (ref 101–111)
CHLORIDE: 112 mmol/L — AB (ref 101–111)
CHLORIDE: 108 mmol/L (ref 101–111)
CHLORIDE: 110 mmol/L (ref 101–111)
CHLORIDE: 113 mmol/L — AB (ref 101–111)
CHLORIDE: 113 mmol/L — AB (ref 101–111)
CREATININE: 2 mg/dL — AB (ref 0.61–1.24)
CREATININE: 2.1 mg/dL — AB (ref 0.61–1.24)
CREATININE: 3.2 mg/dL — AB (ref 0.61–1.24)
CREATININE: 2.1 mg/dL — AB (ref 0.61–1.24)
CREATININE: 3 mg/dL — AB (ref 0.61–1.24)
CREATININE: 3 mg/dL — AB (ref 0.61–1.24)
CREATININE: 2 mg/dL — AB (ref 0.61–1.24)
Calcium, Ion: 0.42 mmol/L — CL (ref 1.12–1.23)
Calcium, Ion: 1.07 mmol/L — ABNORMAL LOW (ref 1.12–1.23)
Calcium, Ion: 1.05 mmol/L — ABNORMAL LOW (ref 1.12–1.23)
Calcium, Ion: 0.41 mmol/L — CL (ref 1.12–1.23)
Calcium, Ion: 0.41 mmol/L — CL (ref 1.12–1.23)
Chloride: 114 mmol/L — ABNORMAL HIGH (ref 101–111)
Chloride: 109 mmol/L (ref 101–111)
Chloride: 114 mmol/L — ABNORMAL HIGH (ref 101–111)
Chloride: 117 mmol/L — ABNORMAL HIGH (ref 101–111)
Chloride: 114 mmol/L — ABNORMAL HIGH (ref 101–111)
Chloride: 116 mmol/L — ABNORMAL HIGH (ref 101–111)
Creatinine, Ser: 3.4 mg/dL — ABNORMAL HIGH (ref 0.61–1.24)
Creatinine, Ser: 2.3 mg/dL — ABNORMAL HIGH (ref 0.61–1.24)
Creatinine, Ser: 2.8 mg/dL — ABNORMAL HIGH (ref 0.61–1.24)
Creatinine, Ser: 2.2 mg/dL — ABNORMAL HIGH (ref 0.61–1.24)
Creatinine, Ser: 2.8 mg/dL — ABNORMAL HIGH (ref 0.61–1.24)
GLUCOSE: 133 mg/dL — AB (ref 65–99)
GLUCOSE: 147 mg/dL — AB (ref 65–99)
GLUCOSE: 152 mg/dL — AB (ref 65–99)
GLUCOSE: 126 mg/dL — AB (ref 65–99)
GLUCOSE: 155 mg/dL — AB (ref 65–99)
GLUCOSE: 115 mg/dL — AB (ref 65–99)
GLUCOSE: 155 mg/dL — AB (ref 65–99)
GLUCOSE: 105 mg/dL — AB (ref 65–99)
GLUCOSE: 141 mg/dL — AB (ref 65–99)
GLUCOSE: 143 mg/dL — AB (ref 65–99)
Glucose, Bld: 127 mg/dL — ABNORMAL HIGH (ref 65–99)
Glucose, Bld: 110 mg/dL — ABNORMAL HIGH (ref 65–99)
HCT: 36 % — ABNORMAL LOW (ref 39.0–52.0)
HCT: 37 % — ABNORMAL LOW (ref 39.0–52.0)
HCT: 39 % (ref 39.0–52.0)
HCT: 37 % — ABNORMAL LOW (ref 39.0–52.0)
HCT: 40 % (ref 39.0–52.0)
HCT: 36 % — ABNORMAL LOW (ref 39.0–52.0)
HCT: 41 % (ref 39.0–52.0)
HEMATOCRIT: 35 % — AB (ref 39.0–52.0)
HEMATOCRIT: 38 % — AB (ref 39.0–52.0)
HEMATOCRIT: 36 % — AB (ref 39.0–52.0)
HEMATOCRIT: 37 % — AB (ref 39.0–52.0)
HEMATOCRIT: 42 % (ref 39.0–52.0)
HEMOGLOBIN: 12.9 g/dL — AB (ref 13.0–17.0)
HEMOGLOBIN: 12.2 g/dL — AB (ref 13.0–17.0)
HEMOGLOBIN: 13.3 g/dL (ref 13.0–17.0)
HEMOGLOBIN: 12.2 g/dL — AB (ref 13.0–17.0)
HEMOGLOBIN: 13.9 g/dL (ref 13.0–17.0)
HEMOGLOBIN: 12.6 g/dL — AB (ref 13.0–17.0)
HEMOGLOBIN: 14.3 g/dL (ref 13.0–17.0)
Hemoglobin: 12.2 g/dL — ABNORMAL LOW (ref 13.0–17.0)
Hemoglobin: 12.6 g/dL — ABNORMAL LOW (ref 13.0–17.0)
Hemoglobin: 11.9 g/dL — ABNORMAL LOW (ref 13.0–17.0)
Hemoglobin: 12.6 g/dL — ABNORMAL LOW (ref 13.0–17.0)
Hemoglobin: 13.6 g/dL (ref 13.0–17.0)
POTASSIUM: 4.2 mmol/L (ref 3.5–5.1)
POTASSIUM: 3.6 mmol/L (ref 3.5–5.1)
POTASSIUM: 4 mmol/L (ref 3.5–5.1)
POTASSIUM: 3.9 mmol/L (ref 3.5–5.1)
POTASSIUM: 3.7 mmol/L (ref 3.5–5.1)
Potassium: 3.3 mmol/L — ABNORMAL LOW (ref 3.5–5.1)
Potassium: 3.5 mmol/L (ref 3.5–5.1)
Potassium: 4 mmol/L (ref 3.5–5.1)
Potassium: 3.5 mmol/L (ref 3.5–5.1)
Potassium: 3.9 mmol/L (ref 3.5–5.1)
Potassium: 3.8 mmol/L (ref 3.5–5.1)
Potassium: 4 mmol/L (ref 3.5–5.1)
SODIUM: 142 mmol/L (ref 135–145)
SODIUM: 146 mmol/L — AB (ref 135–145)
SODIUM: 148 mmol/L — AB (ref 135–145)
SODIUM: 146 mmol/L — AB (ref 135–145)
SODIUM: 148 mmol/L — AB (ref 135–145)
SODIUM: 146 mmol/L — AB (ref 135–145)
SODIUM: 149 mmol/L — AB (ref 135–145)
Sodium: 141 mmol/L (ref 135–145)
Sodium: 147 mmol/L — ABNORMAL HIGH (ref 135–145)
Sodium: 142 mmol/L (ref 135–145)
Sodium: 147 mmol/L — ABNORMAL HIGH (ref 135–145)
Sodium: 143 mmol/L (ref 135–145)
TCO2: 16 mmol/L (ref 0–100)
TCO2: 25 mmol/L (ref 0–100)
TCO2: 16 mmol/L (ref 0–100)
TCO2: 23 mmol/L (ref 0–100)
TCO2: 15 mmol/L (ref 0–100)
TCO2: 21 mmol/L (ref 0–100)
TCO2: 17 mmol/L (ref 0–100)
TCO2: 22 mmol/L (ref 0–100)
TCO2: 15 mmol/L (ref 0–100)
TCO2: 21 mmol/L (ref 0–100)
TCO2: 17 mmol/L (ref 0–100)
TCO2: 21 mmol/L (ref 0–100)

## 2015-04-06 LAB — RENAL FUNCTION PANEL
ALBUMIN: 3.2 g/dL — AB (ref 3.5–5.0)
ANION GAP: 9 (ref 5–15)
ANION GAP: 11 (ref 5–15)
Albumin: 3.2 g/dL — ABNORMAL LOW (ref 3.5–5.0)
BUN: 36 mg/dL — ABNORMAL HIGH (ref 6–20)
BUN: 35 mg/dL — AB (ref 6–20)
CALCIUM: 8.6 mg/dL — AB (ref 8.9–10.3)
CHLORIDE: 109 mmol/L (ref 101–111)
CO2: 23 mmol/L (ref 22–32)
CO2: 21 mmol/L — ABNORMAL LOW (ref 22–32)
CREATININE: 3.28 mg/dL — AB (ref 0.61–1.24)
Calcium: 8.7 mg/dL — ABNORMAL LOW (ref 8.9–10.3)
Chloride: 112 mmol/L — ABNORMAL HIGH (ref 101–111)
Creatinine, Ser: 3.17 mg/dL — ABNORMAL HIGH (ref 0.61–1.24)
GFR calc Af Amer: 24 mL/min — ABNORMAL LOW (ref >60)
GFR, EST AFRICAN AMERICAN: 23 mL/min — AB (ref >60)
GFR, EST NON AFRICAN AMERICAN: 20 mL/min — AB (ref >60)
GFR, EST NON AFRICAN AMERICAN: 21 mL/min — AB (ref >60)
GLUCOSE: 117 mg/dL — AB (ref 65–99)
Glucose, Bld: 123 mg/dL — ABNORMAL HIGH (ref 65–99)
PHOSPHORUS: 3.8 mg/dL (ref 2.5–4.6)
POTASSIUM: 4.2 mmol/L (ref 3.5–5.1)
Phosphorus: 2.6 mg/dL (ref 2.5–4.6)
Potassium: 3.9 mmol/L (ref 3.5–5.1)
SODIUM: 144 mmol/L (ref 135–145)
Sodium: 141 mmol/L (ref 135–145)

## 2015-04-06 LAB — GLUCOSE, CAPILLARY
GLUCOSE-CAPILLARY: 69 mg/dL (ref 65–99)
GLUCOSE-CAPILLARY: 129 mg/dL — AB (ref 65–99)
GLUCOSE-CAPILLARY: 105 mg/dL — AB (ref 65–99)
Glucose-Capillary: 120 mg/dL — ABNORMAL HIGH (ref 65–99)
Glucose-Capillary: 82 mg/dL (ref 65–99)
Glucose-Capillary: 90 mg/dL (ref 65–99)
Glucose-Capillary: 102 mg/dL — ABNORMAL HIGH (ref 65–99)
Glucose-Capillary: 91 mg/dL (ref 65–99)

## 2015-04-06 LAB — MAGNESIUM: MAGNESIUM: 2.4 mg/dL (ref 1.7–2.4)

## 2015-04-06 MED ORDER — MIDAZOLAM HCL 2 MG/2ML IJ SOLN
1.0000 mg | INTRAMUSCULAR | Status: DC | PRN
  Administered 2015-04-06 – 2015-04-10 (×6): 4 mg via INTRAVENOUS
  Filled 2015-04-06 (×5): qty 4

## 2015-04-06 MED ORDER — MIDAZOLAM HCL 2 MG/2ML IJ SOLN
1.0000 mg | INTRAMUSCULAR | Status: DC | PRN
  Administered 2015-04-06: 4 mg via INTRAVENOUS
  Administered 2015-04-06: 2 mg via INTRAVENOUS

## 2015-04-06 MED ORDER — MIDAZOLAM HCL 2 MG/2ML IJ SOLN
INTRAMUSCULAR | Status: AC
  Administered 2015-04-06: 2 mg via INTRAVENOUS
  Filled 2015-04-06: qty 2

## 2015-04-06 MED ORDER — QUETIAPINE FUMARATE 50 MG PO TABS
25.0000 mg | ORAL_TABLET | Freq: Every day | ORAL | Status: DC
  Administered 2015-04-06 – 2015-04-11 (×6): 25 mg
  Filled 2015-04-06 (×6): qty 1

## 2015-04-06 MED ORDER — DEXTROSE 5 % IV SOLN
0.0000 ug/min | INTRAVENOUS | Status: DC
  Administered 2015-04-06: 40 ug/min via INTRAVENOUS
  Administered 2015-04-07: 65 ug/min via INTRAVENOUS
  Administered 2015-04-08: 300 ug/min via INTRAVENOUS
  Administered 2015-04-08: 100 ug/min via INTRAVENOUS
  Administered 2015-04-08: 150 ug/min via INTRAVENOUS
  Administered 2015-04-09: 50 ug/min via INTRAVENOUS
  Administered 2015-04-09: 100 ug/min via INTRAVENOUS
  Administered 2015-04-10: 50 ug/min via INTRAVENOUS
  Administered 2015-04-10: 40 ug/min via INTRAVENOUS
  Administered 2015-04-11: 50 ug/min via INTRAVENOUS
  Administered 2015-04-11: 70 ug/min via INTRAVENOUS
  Administered 2015-04-13: 30 ug/min via INTRAVENOUS
  Administered 2015-04-13 – 2015-04-14 (×2): 50 ug/min via INTRAVENOUS
  Filled 2015-04-06 (×16): qty 4

## 2015-04-06 MED ORDER — IPRATROPIUM-ALBUTEROL 0.5-2.5 (3) MG/3ML IN SOLN
3.0000 mL | RESPIRATORY_TRACT | Status: DC
  Administered 2015-04-06 – 2015-04-08 (×13): 3 mL via RESPIRATORY_TRACT
  Filled 2015-04-06 (×12): qty 3

## 2015-04-06 MED ORDER — SODIUM CHLORIDE 0.9 % IV BOLUS (SEPSIS)
250.0000 mL | Freq: Once | INTRAVENOUS | Status: AC
  Administered 2015-04-06: 250 mL via INTRAVENOUS

## 2015-04-06 MED ORDER — HALOPERIDOL LACTATE 5 MG/ML IJ SOLN
5.0000 mg | Freq: Four times a day (QID) | INTRAMUSCULAR | Status: DC | PRN
  Administered 2015-04-06: 5 mg via INTRAVENOUS
  Filled 2015-04-06 (×2): qty 1

## 2015-04-06 MED ORDER — PHENYLEPHRINE HCL 10 MG/ML IJ SOLN
0.0000 ug/min | INTRAMUSCULAR | Status: DC
  Administered 2015-04-06: 20 ug/min via INTRAVENOUS
  Filled 2015-04-06: qty 1

## 2015-04-06 NOTE — Progress Notes (Signed)
eLink Physician-Brief Progress Note Patient Name: Gary Rivera DOB: 27-Jan-1961 MRN: [id number]   Date of Service  04/06/2015  HPI/Events of Note  RN reported severe agitation & attempting to climb out of bed. 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  eICU Interventions  1. Restart Versed drip 2. Versed 1-4mg  IV prn 3. Haldol 5mg  IV q6hr prn 4. EKG to assess QTc on Haldol 5. Bilateral soft wrist restraints     Intervention Category Major Interventions: Delirium, psychosis, severe agitation - evaluation and management  Lawanda Cousins 04/06/2015, 12:45 AM

## 2015-04-06 NOTE — Progress Notes (Signed)
Spoke to MD Nester in the regard of excessive vent alarming. PIP increase to the upper 60's and higher. Pt appears comfortable on the vent at this moment. Pt has normal plateau pressure, and minimal secretions when bagged lavaged with a PEEP valve. Per MD Note: Made aware of persistent ventilator alarming. Peak pressures elevated with normal plateau pressure. Patient not receiving appropriate tidal volume. Symmetric chest rise. Patient now comfortable on camera chk & not fighting ventilator. Minimal to no secretions.  Per Note/Verbal Order: MD stated to decrease tidal volume to and give Stat Neb to treat airways. Changes are made per RT. STAT CXR pending. RT will continue to monitor.

## 2015-04-06 NOTE — Progress Notes (Signed)
[facility] Progress Note  Assessment: 1 A/C resp failure - no pulm edema by CXR/ CT. Upper airway issues, OHS +/- bibasilar PNA vs atx by CT. On vent, per CCM. Weaning today, may need trach ?  2 ESRD- normally TTS at [facility]-  on CRRT now. Under dry wt 1kg. No vol excess on exam/ CXR etc. Systemic PTT was  too high (140) so heparin was stopped and now on citrate 3 Shock - back on pressors 4 MBD increased hect to 3 ug, dec'd Ca bath to 2.5 form 3.5. P ok 5 Anemia - continue aranesp 60/wk 6 Bivent HF LVEF 30-35%, RV dysfxn w pulm htn 7 Depression - current meds 8 Left antecubital wound - seen by VVS 12/15 - has intermittently been on Vanc with wet to dry dressings  Plan -  Cont CRRT. Running even   Katharine Rochefort A   04/06/2015, 8:27 AM   Subjective:  Agitated and hypotensive overnight- phenylephrine started - CRRT running well  Objective Filed Vitals:   04/06/15 0745 04/06/15 0800 04/06/15 0803 04/06/15 0815  BP: 103/65 117/92  138/51  Pulse: 63  90   Temp: 97.5 F (36.4 C)     TempSrc: Axillary     Resp: Height:      Weight:      SpO2: 99%  96%    Physical Exam General: on vent - alert- nods head to questions Heart:RRR Lungs: occ rhonchi Abdomen: obese soft Extremities: no sig LE edema Dialysis Access: right IJ TDC left upper AVF  Dialysis Orders: [facility] 4.5h 135g 2/3.5 bath R IJ cath Hep 4200 Had been leaving below edw - last tmt prior to adm 12/31- EDW lowered to 135 after admission earlier this month Hect 2 ug  pth 849 Mircera 75 q 2wks 12/27  Midodrine 10 tiw pre HD  Additional Objective Labs: Basic Metabolic Panel:  Recent Labs Lab 04/05/15 0426 04/05/15 1617 04/06/15 0603  NA 140 140 141  K 4.2 4.0 3.9  CL 102 100* 109  CO2 GLUCOSE 107* 111* 123*  BUN 33* 32* 36*  CREATININE 3.68* 3.20* 3.28*  CALCIUM 9.2 9.0 8.7*  PHOS 2.8 3.6 2.6   Liver Function Tests:  Recent Labs Lab  04/05/15 0426 04/05/15 1617 04/06/15 0603  ALBUMIN 3.1* 3.0* 3.2*   CBC:  Recent Labs Lab 03/31/15 0505 04/01/15 0930 04/02/15 0500 04/03/15 0440 04/05/15 0426  WBC 6.3 5.7 7.4 10.1 8.4  NEUTROABS  --  4.2 4.9 8.8*  --   HGB 8.9* 9.8* 9.1* 10.2* 9.9*  HCT 31.2* 33.5* 31.2* 32.9* 32.4*  MCV 98.4 97.7 94.8 91.1 94.5  PLT 135* 135* 153 158 137*   Blood Culture    Component Value Date/Time   SDES TRACHEAL ASPIRATE 04/02/2015 0015   SPECREQUEST Normal 04/02/2015 0015   CULT  04/02/2015 0015    NORMAL OROPHARYNGEAL FLORA Performed at [facility]    REPTSTATUS 04/05/2015 FINAL 04/02/2015 0015   CBG:  Recent Labs Lab 04/05/15 1947 04/05/15 2114 04/06/15 0009 04/06/15 0423 04/06/15 0751  GLUCAP 69 82 90 120* 102*   Medications: . sodium chloride Stopped (04/05/15 0700)  . calcium gluconate infusion for CRRT 20 g (04/06/15 0800)  . fentaNYL infusion INTRAVENOUS 250 mcg/hr (04/06/15 1610)  . midazolam (VERSED) infusion 10 mg/hr (04/06/15 0808)  . norepinephrine (LEVOPHED) Adult infusion Stopped (04/04/15 1715)  . phenylephrine (NEO-SYNEPHRINE) Adult infusion 45 mcg/min (04/06/15 0822)  . dialysate  solution for CRRT (citrate protocol) 2,000 mL/hr at 04/06/15 0706  . dialysis replacement fluid (prismasate) 200 mL/hr at 04/05/15 0806  . sodium citrate 2 %/dextrose 2.5% solution 3000 mL 280 mL/hr at 04/06/15 0705   . amitriptyline  25 mg Oral QHS  . antiseptic oral rinse  7 mL Mouth Rinse 10 times per day  . chlorhexidine gluconate  15 mL Mouth Rinse BID  . darbepoetin (ARANESP) injection - DIALYSIS  60 mcg Intravenous Q Thu-HD  . doxercalciferol  3 mcg Intravenous Q T,Th,Sa-HD  . feeding supplement (NEPRO CARB STEADY)  1,000 mL Per Tube Q24H  . feeding supplement (PRO-STAT SUGAR FREE 64)  60 mL Per Tube 5 X Daily  . hydrocortisone sod succinate (SOLU-CORTEF) inj  50 mg Intravenous Q6H  . ipratropium-albuterol  3 mL Nebulization Q4H  . multivitamin  1 tablet  Oral QHS  . pantoprazole sodium  40 mg Per Tube QHS

## 2015-04-06 NOTE — Progress Notes (Signed)
eLink Physician-Brief Progress Note Patient Name: Gary Rivera DOB: 07-19-1960 MRN: [id number]   Date of Service  04/06/2015  HPI/Events of Note  RN notified of hypotension post medications. Not currently diuresing with CVVHD. Weaning Fentanyl drip.  eICU Interventions  NS 250cc bolus now.     Intervention Category Major Interventions: Hypotension - evaluation and management  Lawanda Cousins 04/06/2015, 1:35 AM

## 2015-04-06 NOTE — Progress Notes (Signed)
eLink Physician-Brief Progress Note Patient Name: [name] DOB: Jan 15, 1961 MRN: [id number]   Date of Service  04/06/2015  HPI/Events of Note  RN notified patient again agitated & attempting to climb out of bed. Camera check shows patient laying in bed with nurses at bedside. Difficult to ascertain accurate BP due to movement. Haldol little effect. Unable to use Precedex & Propofol due to hypotension.  eICU Interventions  1. Neo gtt for BP support PRN 2. Weaning Fentanyl gtt & using Versed more     Intervention Category Major Interventions: Change in mental status - evaluation and management  Lawanda Cousins 04/06/2015, 2:29 AM

## 2015-04-06 NOTE — Progress Notes (Signed)
Dr. Kathrene Bongo made aware pt. Blue port of HD cath has clotted off. IV team paged.

## 2015-04-06 NOTE — Progress Notes (Signed)
eLink Physician-Brief Progress Note Patient Name: Gary Rivera DOB: [birth year] MRN: [id number]   Date of Service  04/06/2015  HPI/Events of Note  Made aware of persistent ventilator alarming. Peak pressures elevated with normal plateau pressure. Patient not receiving appropriate tidal volume. Symmetric chest rise. Patient now comfortable on camera chk & not fighting ventilator. Minimal to no secretions.  eICU Interventions  1. Change Duonebs to q4hr 2. Stat Portable CXR     Intervention Category Major Interventions: Respiratory failure - evaluation and management  Lawanda Cousins 04/06/2015, 4:12 AM

## 2015-04-06 NOTE — Progress Notes (Signed)
PULMONARY / CRITICAL CARE MEDICINE   Name: Gary Rivera MRN: [id number] DOB: 1960-11-18    ADMISSION DATE:  03/29/2015   REFERRING MD:  ED  CHIEF COMPLAINT:  Resp failure  SUBJECTIVE:   Significant agitation overnight as well as hypotension. Restarted on Versed gtt, on Neo for BP. CVP 16 this AM.   VITAL SIGNS: BP 90/50 mmHg  Pulse 66  Temp(Src) 97.5 F (36.4 C) (Axillary)  Resp 29  Ht 6\' 2"  (1.88 m)  Wt 294 lb 1.5 oz (133.4 kg)  BMI 37.74 kg/m2  SpO2 99%  HEMODYNAMICS: CVP:  [10 mmHg-16 mmHg] 16 mmHg  VENTILATOR SETTINGS: Vent Mode:  [-] PSV;CPAP FiO2 (%):  [40 %] 40 % Set Rate:  [20 bmp] 20 bmp Vt Set:  [600 mL-650 mL] 600 mL PEEP:  [5 cmH20] 5 cmH20 Pressure Support:  [8 cmH20-15 cmH20] 8 cmH20 Plateau Pressure:  [22 cmH20-26 cmH20] 26 cmH20  INTAKE / OUTPUT: I/O last 3 completed shifts: In: 2929.9 [I.V.:1669.9; NG/GT:1010; IV Piggyback:250] Out: 2457 [Other:2457]  PHYSICAL EXAMINATION: General: acutely ill appearing male, intubated, lying in bed. Weaning. Neuro: RASS -1, moves all extremities spontaneously HEENT:  PERRL, moist mucus membranes. ETT in place. RIJ tunneled cath, LIJ CVL.  Cardiovascular:  RRR, no murmurs, gallops, or rubs.  Lungs: Air entry equal bilaterally, coarse, no rales or wheezes.  Abdomen: obese, soft, NT, ND and +BS. Musculoskeletal:  Intact. Skin:  Left antecubital wound noted, right PIV antecubital  LABS:  BMET  Recent Labs Lab 04/05/15 0426 04/05/15 1617  04/06/15 0603 04/06/15 0704 04/06/15 0859  NA 140 140  < > 141 142 143  K 4.2 4.0  < > 3.9 4.0 3.9  CL 102 100*  < > 109 109 110  CO2 26 28  --  23  --   --   BUN 33* 32*  < > 36* 37* 36*  CREATININE 3.68* 3.20*  < > 3.28* 3.00* 3.00*  GLUCOSE 107* 111*  < > 123* 126* 115*  < > = values in this interval not displayed. Electrolytes  Recent Labs Lab 04/04/15 0401  04/05/15 0426 04/05/15 1617 04/06/15 0603  CALCIUM 9.2  < > 9.2 9.0 8.7*  MG 2.5*  --  2.6*   --  2.4  PHOS 3.2  < > 2.8 3.6 2.6  < > = values in this interval not displayed. CBC  Recent Labs Lab 04/02/15 0500 04/03/15 0440 04/05/15 0426  04/06/15 0516 04/06/15 0704 04/06/15 0859  WBC 7.4 10.1 8.4  --   --   --   --   HGB 9.1* 10.2* 9.9*  < > 11.9* 12.2* 12.6*  HCT 31.2* 32.9* 32.4*  < > 35.0* 36.0* 37.0*  PLT 153 158 137*  --   --   --   --   < > = values in this interval not displayed. Coag's  Recent Labs Lab 04/03/15 0440 04/04/15 0401 04/05/15 0426  APTT 42* 35 140*   Sepsis Markers  Recent Labs Lab 04/01/15 0930 04/03/15 0440 04/05/15 0426  PROCALCITON 2.57 1.77 1.02   ABG  Recent Labs Lab 04/01/15 2135 04/02/15 0341  PHART 6.979* 7.412  PCO2ART 151* 41.6  PO2ART 199* 64.0*   Liver Enzymes  Recent Labs Lab 04/05/15 0426 04/05/15 1617 04/06/15 0603  ALBUMIN 3.1* 3.0* 3.2*    Glucose  Recent Labs Lab 04/05/15 1622 04/05/15 1947 04/05/15 2114 04/06/15 0009 04/06/15 0423 04/06/15 0751  GLUCAP 99 69 82 90 120* 102*  Imaging Dg Chest Port 1 View  04/06/2015  CLINICAL DATA:  55 year old male with respiratory failure EXAM: PORTABLE CHEST 1 VIEW COMPARISON:  Radiograph dated 04/05/2015 FINDINGS: Endotracheal tube above the carina enteric tube coursing towards upper abdomen. Right-sided dialysis catheter and left IJ central line in stable positioning. There is stable cardiomegaly. Low lung volumes. Focal density at the right lung base similar to prior study and may represent atelectasis versus pneumonia. Left lung base opacity appears stable and may represent atelectasis/pneumonia. A small left pleural effusion may be present. No pneumothorax. IMPRESSION: No significant interval change. Electronically Signed   By: Elgie Collard M.D.   On: 04/06/2015 04:34   STUDIES:  CT chest 1/12 >> Bibasilar atelectasis CT neck 1/12 >> artifact with movement, basilar consolidation with air bronchograms, no mass  CULTURES: 1/8 Blood x 2 >>  neg 1/8 Urine >> not done 1/12 Sputum >> Oral flora 1/11 RVP >> neg  ANTIBIOTICS: 1/8 vanc >> 1/12 1/8 zoysn >> 1/12  SIGNIFICANT EVENTS: 1/6 Discharge from [facility] following intubation 1/8 Admitted, intubated 1/9 Extubated 1/11 Re-intubated 1/13-- hypothermia on cvvhd  LINES/TUBES: 1/8 ETT>>01/09>>>1/11>>> LIJ CVL 1/11 >>   DISCUSSION: 55 yo MO AAF non compliant with HD and CPAP and re-presents with hypercarbic and hypoxic resp failure  ASSESSMENT / PLAN:  PULMONARY A: VDRF/Hypercarbic respiratory failure OSA / OHS  Very DIFFICULT AIRWAY required bronchoscopy for intubation by PCCM Bibasilar atelectasis P:   Full vent support Wean PEEP / FiO2 for sats >92% Daily SBT / WUA Will consider extubation soon if mental status can support, otherwise will need trach Duonebs q4h Upright positioning as tolerated   CARDIOVASCULAR A:  HTN Pulmonary HTN MVR Continued Hypotension, most likely 2/2 sedation Chronic Combined CHF P:  CVP 16 this AM Neo gtt for MAP <65 Hold antihypertensives Even balance Stress dose steroids; hydrocortisone 50 q6h  RENAL A:   ESRD - on HD, non-compliant, RIJ tunneled cath Hyperkalemia; resolved P:   CVVHD per renal, goal - even balance; can likely change to intermittent HD Follow BMP Replace electrolytes as indicated   GASTROINTESTINAL A:   GI PPx P:   Protonix TF as tolerated   HEMATOLOGIC A:   Anemia of chronic illness DVT PPx P:  CBC in AM Heparin [place]  INFECTIOUS A:   Left antecubital wound  Dependent Airspace Disease  P:   Off abx since 1/12 Monitor fever curve / WBC Follow cultures  ENDOCRINE A:   No acute issuese P:   Glucose on BMP  NEUROLOGIC A:   Metabolic Encephalopathy - in setting of hypercarbia Agitation P:   RASS goal: -1 Continue home amitriptyline  Fentanyl gtt/ Attempt to turn off Versed gtt Start atypical antipsychotic; Seroquel 25 mg daily. Haldol prn Daily WUA    FAMILY  - Updates:  None at bedside, patient updated on status.    Lauris Chroman, MD PGY-3, Internal Medicine Pager: [phone]  Attending Note:  55 year old male in VDRF due to volume overload and non-compliance with HD.  Patient was intubated multiple times during this admission for hypercarbic respiratory failure.  At this point, I do not feel comfortable extubating patient given pressor and sedation needs as well as the number of times patient has been intubated.  On exam, lung with coarse BS.  Patient is aroudable and interactive on CRRT even.  Remains on neo which I suspect is due to sedation needs as patient becomes very agitated.  Will start PO seroquel 25 mg daily and attempt  to get off sedation all together.  RN to call sister Kendal Hymen for discussion regarding trach later on this week.  The patient is critically ill with multiple organ systems failure and requires high complexity decision making for assessment and support, frequent evaluation and titration of therapies, application of advanced monitoring technologies and extensive interpretation of multiple databases.   Critical Care Time devoted to patient care services described in this note is  35  Minutes. This time reflects time of care of this signee Dr Koren Bound. This critical care time does not reflect procedure time, or teaching time or supervisory time of PA/NP/Med student/Med Resident etc but could involve care discussion time.  Alyson Reedy, M.D. [facility] Pulmonary/Critical Care Medicine. Pager: [phone]. After hours pager: [phone].

## 2015-04-07 ENCOUNTER — Inpatient Hospital Stay (HOSPITAL_COMMUNITY): Payer: Medicare Other

## 2015-04-07 ENCOUNTER — Ambulatory Visit (HOSPITAL_BASED_OUTPATIENT_CLINIC_OR_DEPARTMENT_OTHER): Payer: Medicare Other | Attending: Internal Medicine

## 2015-04-07 LAB — BLOOD GAS, ARTERIAL
ACID-BASE DEFICIT: 16.3 mmol/L — AB (ref 0.0–2.0)
Bicarbonate: 10.3 mEq/L — ABNORMAL LOW (ref 20.0–24.0)
DRAWN BY: 44166
FIO2: 0.8
MECHVT: 600 mL
O2 Saturation: 98.5 %
PEEP: 5 cmH2O
PH ART: 7.199 — AB (ref 7.350–7.450)
PO2 ART: 137 mmHg — AB (ref 80.0–100.0)
Patient temperature: 97.9
RATE: 30 resp/min
TCO2: 11.1 mmol/L (ref 0–100)
pCO2 arterial: 27.2 mmHg — ABNORMAL LOW (ref 35.0–45.0)

## 2015-04-07 LAB — POCT I-STAT, CHEM 8
BUN: 34 mg/dL — ABNORMAL HIGH (ref 6–20)
BUN: 47 mg/dL — AB (ref 6–20)
BUN: 31 mg/dL — ABNORMAL HIGH (ref 6–20)
BUN: 45 mg/dL — AB (ref 6–20)
BUN: 34 mg/dL — AB (ref 6–20)
BUN: 44 mg/dL — AB (ref 6–20)
BUN: 32 mg/dL — ABNORMAL HIGH (ref 6–20)
BUN: 43 mg/dL — AB (ref 6–20)
BUN: 32 mg/dL — AB (ref 6–20)
BUN: 39 mg/dL — ABNORMAL HIGH (ref 6–20)
BUN: 31 mg/dL — AB (ref 6–20)
BUN: 41 mg/dL — ABNORMAL HIGH (ref 6–20)
BUN: 36 mg/dL — ABNORMAL HIGH (ref 6–20)
BUN: 37 mg/dL — ABNORMAL HIGH (ref 6–20)
BUN: 32 mg/dL — ABNORMAL HIGH (ref 6–20)
BUN: 29 mg/dL — ABNORMAL HIGH (ref 6–20)
BUN: 29 mg/dL — AB (ref 6–20)
CALCIUM ION: 0.49 mmol/L — AB (ref 1.12–1.23)
CALCIUM ION: 0.44 mmol/L — AB (ref 1.12–1.23)
CALCIUM ION: 1.16 mmol/L (ref 1.12–1.23)
CALCIUM ION: 0.57 mmol/L — AB (ref 1.12–1.23)
CALCIUM ION: 0.44 mmol/L — AB (ref 1.12–1.23)
CHLORIDE: 120 mmol/L — AB (ref 101–111)
CHLORIDE: 115 mmol/L — AB (ref 101–111)
CHLORIDE: 119 mmol/L — AB (ref 101–111)
CHLORIDE: 116 mmol/L — AB (ref 101–111)
CHLORIDE: 121 mmol/L — AB (ref 101–111)
CHLORIDE: 117 mmol/L — AB (ref 101–111)
CHLORIDE: 119 mmol/L — AB (ref 101–111)
CHLORIDE: 118 mmol/L — AB (ref 101–111)
CHLORIDE: 119 mmol/L — AB (ref 101–111)
CHLORIDE: 121 mmol/L — AB (ref 101–111)
CHLORIDE: 119 mmol/L — AB (ref 101–111)
CHLORIDE: 119 mmol/L — AB (ref 101–111)
CREATININE: 3.6 mg/dL — AB (ref 0.61–1.24)
CREATININE: 3.5 mg/dL — AB (ref 0.61–1.24)
CREATININE: 3.4 mg/dL — AB (ref 0.61–1.24)
CREATININE: 2.6 mg/dL — AB (ref 0.61–1.24)
CREATININE: 2.2 mg/dL — AB (ref 0.61–1.24)
CREATININE: 2.1 mg/dL — AB (ref 0.61–1.24)
Calcium, Ion: 1.19 mmol/L (ref 1.12–1.23)
Calcium, Ion: 1.18 mmol/L (ref 1.12–1.23)
Calcium, Ion: 0.45 mmol/L — CL (ref 1.12–1.23)
Calcium, Ion: 1.18 mmol/L (ref 1.12–1.23)
Calcium, Ion: 1.16 mmol/L (ref 1.12–1.23)
Calcium, Ion: 0.44 mmol/L — CL (ref 1.12–1.23)
Calcium, Ion: 1.22 mmol/L (ref 1.12–1.23)
Calcium, Ion: 1.13 mmol/L (ref 1.12–1.23)
Calcium, Ion: 1.16 mmol/L (ref 1.12–1.23)
Calcium, Ion: 0.55 mmol/L — CL (ref 1.12–1.23)
Calcium, Ion: 0.44 mmol/L — CL (ref 1.12–1.23)
Calcium, Ion: 0.37 mmol/L — CL (ref 1.12–1.23)
Chloride: 114 mmol/L — ABNORMAL HIGH (ref 101–111)
Chloride: 117 mmol/L — ABNORMAL HIGH (ref 101–111)
Chloride: 119 mmol/L — ABNORMAL HIGH (ref 101–111)
Chloride: 121 mmol/L — ABNORMAL HIGH (ref 101–111)
Chloride: 119 mmol/L — ABNORMAL HIGH (ref 101–111)
Creatinine, Ser: 2.4 mg/dL — ABNORMAL HIGH (ref 0.61–1.24)
Creatinine, Ser: 3.9 mg/dL — ABNORMAL HIGH (ref 0.61–1.24)
Creatinine, Ser: 2.4 mg/dL — ABNORMAL HIGH (ref 0.61–1.24)
Creatinine, Ser: 3.8 mg/dL — ABNORMAL HIGH (ref 0.61–1.24)
Creatinine, Ser: 2.5 mg/dL — ABNORMAL HIGH (ref 0.61–1.24)
Creatinine, Ser: 2.4 mg/dL — ABNORMAL HIGH (ref 0.61–1.24)
Creatinine, Ser: 3.4 mg/dL — ABNORMAL HIGH (ref 0.61–1.24)
Creatinine, Ser: 2.7 mg/dL — ABNORMAL HIGH (ref 0.61–1.24)
Creatinine, Ser: 2.3 mg/dL — ABNORMAL HIGH (ref 0.61–1.24)
Creatinine, Ser: 3.7 mg/dL — ABNORMAL HIGH (ref 0.61–1.24)
Creatinine, Ser: 3.4 mg/dL — ABNORMAL HIGH (ref 0.61–1.24)
GLUCOSE: 136 mg/dL — AB (ref 65–99)
GLUCOSE: 106 mg/dL — AB (ref 65–99)
GLUCOSE: 124 mg/dL — AB (ref 65–99)
GLUCOSE: 125 mg/dL — AB (ref 65–99)
GLUCOSE: 150 mg/dL — AB (ref 65–99)
Glucose, Bld: 109 mg/dL — ABNORMAL HIGH (ref 65–99)
Glucose, Bld: 128 mg/dL — ABNORMAL HIGH (ref 65–99)
Glucose, Bld: 108 mg/dL — ABNORMAL HIGH (ref 65–99)
Glucose, Bld: 111 mg/dL — ABNORMAL HIGH (ref 65–99)
Glucose, Bld: 122 mg/dL — ABNORMAL HIGH (ref 65–99)
Glucose, Bld: 103 mg/dL — ABNORMAL HIGH (ref 65–99)
Glucose, Bld: 139 mg/dL — ABNORMAL HIGH (ref 65–99)
Glucose, Bld: 112 mg/dL — ABNORMAL HIGH (ref 65–99)
Glucose, Bld: 152 mg/dL — ABNORMAL HIGH (ref 65–99)
Glucose, Bld: 121 mg/dL — ABNORMAL HIGH (ref 65–99)
Glucose, Bld: 86 mg/dL (ref 65–99)
Glucose, Bld: 172 mg/dL — ABNORMAL HIGH (ref 65–99)
HCT: 35 % — ABNORMAL LOW (ref 39.0–52.0)
HCT: 36 % — ABNORMAL LOW (ref 39.0–52.0)
HCT: 36 % — ABNORMAL LOW (ref 39.0–52.0)
HCT: 40 % (ref 39.0–52.0)
HCT: 41 % (ref 39.0–52.0)
HCT: 42 % (ref 39.0–52.0)
HEMATOCRIT: 37 % — AB (ref 39.0–52.0)
HEMATOCRIT: 39 % (ref 39.0–52.0)
HEMATOCRIT: 36 % — AB (ref 39.0–52.0)
HEMATOCRIT: 39 % (ref 39.0–52.0)
HEMATOCRIT: 39 % (ref 39.0–52.0)
HEMATOCRIT: 41 % (ref 39.0–52.0)
HEMATOCRIT: 36 % — AB (ref 39.0–52.0)
HEMATOCRIT: 39 % (ref 39.0–52.0)
HEMATOCRIT: 34 % — AB (ref 39.0–52.0)
HEMATOCRIT: 39 % (ref 39.0–52.0)
HEMATOCRIT: 36 % — AB (ref 39.0–52.0)
HEMOGLOBIN: 12.6 g/dL — AB (ref 13.0–17.0)
HEMOGLOBIN: 13.3 g/dL (ref 13.0–17.0)
HEMOGLOBIN: 13.9 g/dL (ref 13.0–17.0)
HEMOGLOBIN: 12.2 g/dL — AB (ref 13.0–17.0)
HEMOGLOBIN: 12.2 g/dL — AB (ref 13.0–17.0)
HEMOGLOBIN: 12.2 g/dL — AB (ref 13.0–17.0)
HEMOGLOBIN: 13.6 g/dL (ref 13.0–17.0)
HEMOGLOBIN: 13.9 g/dL (ref 13.0–17.0)
Hemoglobin: 11.9 g/dL — ABNORMAL LOW (ref 13.0–17.0)
Hemoglobin: 12.2 g/dL — ABNORMAL LOW (ref 13.0–17.0)
Hemoglobin: 12.2 g/dL — ABNORMAL LOW (ref 13.0–17.0)
Hemoglobin: 13.3 g/dL (ref 13.0–17.0)
Hemoglobin: 13.3 g/dL (ref 13.0–17.0)
Hemoglobin: 13.3 g/dL (ref 13.0–17.0)
Hemoglobin: 11.6 g/dL — ABNORMAL LOW (ref 13.0–17.0)
Hemoglobin: 13.3 g/dL (ref 13.0–17.0)
Hemoglobin: 14.3 g/dL (ref 13.0–17.0)
POTASSIUM: 4.2 mmol/L (ref 3.5–5.1)
POTASSIUM: 4 mmol/L (ref 3.5–5.1)
POTASSIUM: 4.1 mmol/L (ref 3.5–5.1)
POTASSIUM: 3.7 mmol/L (ref 3.5–5.1)
POTASSIUM: 4 mmol/L (ref 3.5–5.1)
POTASSIUM: 3.5 mmol/L (ref 3.5–5.1)
POTASSIUM: 4 mmol/L (ref 3.5–5.1)
POTASSIUM: 3.7 mmol/L (ref 3.5–5.1)
POTASSIUM: 3.6 mmol/L (ref 3.5–5.1)
POTASSIUM: 3.5 mmol/L (ref 3.5–5.1)
POTASSIUM: 3.2 mmol/L — AB (ref 3.5–5.1)
POTASSIUM: 3.3 mmol/L — AB (ref 3.5–5.1)
Potassium: 3.6 mmol/L (ref 3.5–5.1)
Potassium: 4.2 mmol/L (ref 3.5–5.1)
Potassium: 3.5 mmol/L (ref 3.5–5.1)
Potassium: 4.5 mmol/L (ref 3.5–5.1)
Potassium: 3.7 mmol/L (ref 3.5–5.1)
SODIUM: 145 mmol/L (ref 135–145)
SODIUM: 149 mmol/L — AB (ref 135–145)
SODIUM: 146 mmol/L — AB (ref 135–145)
SODIUM: 150 mmol/L — AB (ref 135–145)
SODIUM: 145 mmol/L (ref 135–145)
SODIUM: 148 mmol/L — AB (ref 135–145)
SODIUM: 150 mmol/L — AB (ref 135–145)
SODIUM: 147 mmol/L — AB (ref 135–145)
SODIUM: 150 mmol/L — AB (ref 135–145)
SODIUM: 152 mmol/L — AB (ref 135–145)
SODIUM: 147 mmol/L — AB (ref 135–145)
SODIUM: 152 mmol/L — AB (ref 135–145)
SODIUM: 152 mmol/L — AB (ref 135–145)
Sodium: 151 mmol/L — ABNORMAL HIGH (ref 135–145)
Sodium: 148 mmol/L — ABNORMAL HIGH (ref 135–145)
Sodium: 146 mmol/L — ABNORMAL HIGH (ref 135–145)
Sodium: 151 mmol/L — ABNORMAL HIGH (ref 135–145)
TCO2: 14 mmol/L (ref 0–100)
TCO2: 20 mmol/L (ref 0–100)
TCO2: 13 mmol/L (ref 0–100)
TCO2: 19 mmol/L (ref 0–100)
TCO2: 14 mmol/L (ref 0–100)
TCO2: 18 mmol/L (ref 0–100)
TCO2: 14 mmol/L (ref 0–100)
TCO2: 20 mmol/L (ref 0–100)
TCO2: 14 mmol/L (ref 0–100)
TCO2: 18 mmol/L (ref 0–100)
TCO2: 13 mmol/L (ref 0–100)
TCO2: 19 mmol/L (ref 0–100)
TCO2: 17 mmol/L (ref 0–100)
TCO2: 17 mmol/L (ref 0–100)
TCO2: 14 mmol/L (ref 0–100)
TCO2: 13 mmol/L (ref 0–100)
TCO2: 13 mmol/L (ref 0–100)

## 2015-04-07 LAB — GLUCOSE, CAPILLARY
GLUCOSE-CAPILLARY: 100 mg/dL — AB (ref 65–99)
GLUCOSE-CAPILLARY: 102 mg/dL — AB (ref 65–99)
GLUCOSE-CAPILLARY: 72 mg/dL (ref 65–99)
GLUCOSE-CAPILLARY: 105 mg/dL — AB (ref 65–99)
GLUCOSE-CAPILLARY: 121 mg/dL — AB (ref 65–99)
Glucose-Capillary: 89 mg/dL (ref 65–99)

## 2015-04-07 LAB — POCT I-STAT 3, ART BLOOD GAS (G3+)
Acid-base deficit: 12 mmol/L — ABNORMAL HIGH (ref 0.0–2.0)
Bicarbonate: 16.7 mEq/L — ABNORMAL LOW (ref 20.0–24.0)
O2 SAT: 85 %
TCO2: 18 mmol/L (ref 0–100)
pCO2 arterial: 46.7 mmHg — ABNORMAL HIGH (ref 35.0–45.0)
pH, Arterial: 7.158 — CL (ref 7.350–7.450)
pO2, Arterial: 63 mmHg — ABNORMAL LOW (ref 80.0–100.0)

## 2015-04-07 LAB — CBC
HEMATOCRIT: 35.1 % — AB (ref 39.0–52.0)
HEMOGLOBIN: 9.8 g/dL — AB (ref 13.0–17.0)
MCH: 28.3 pg (ref 26.0–34.0)
MCHC: 27.9 g/dL — AB (ref 30.0–36.0)
MCV: 101.4 fL — ABNORMAL HIGH (ref 78.0–100.0)
Platelets: 125 10*3/uL — ABNORMAL LOW (ref 150–400)
RBC: 3.46 MIL/uL — ABNORMAL LOW (ref 4.22–5.81)
RDW: 19.1 % — ABNORMAL HIGH (ref 11.5–15.5)
WBC: 10.3 10*3/uL (ref 4.0–10.5)

## 2015-04-07 LAB — BASIC METABOLIC PANEL
ANION GAP: 11 (ref 5–15)
BUN: 44 mg/dL — ABNORMAL HIGH (ref 6–20)
CALCIUM: 8.9 mg/dL (ref 8.9–10.3)
CO2: 20 mmol/L — AB (ref 22–32)
Chloride: 114 mmol/L — ABNORMAL HIGH (ref 101–111)
Creatinine, Ser: 3.82 mg/dL — ABNORMAL HIGH (ref 0.61–1.24)
GFR calc non Af Amer: 16 mL/min — ABNORMAL LOW (ref >60)
GFR, EST AFRICAN AMERICAN: 19 mL/min — AB (ref >60)
GLUCOSE: 110 mg/dL — AB (ref 65–99)
POTASSIUM: 4.2 mmol/L (ref 3.5–5.1)
Sodium: 145 mmol/L (ref 135–145)

## 2015-04-07 LAB — RENAL FUNCTION PANEL
ALBUMIN: 3.3 g/dL — AB (ref 3.5–5.0)
ANION GAP: 13 (ref 5–15)
BUN: 37 mg/dL — ABNORMAL HIGH (ref 6–20)
CALCIUM: 9 mg/dL (ref 8.9–10.3)
CO2: 17 mmol/L — ABNORMAL LOW (ref 22–32)
Chloride: 116 mmol/L — ABNORMAL HIGH (ref 101–111)
Creatinine, Ser: 3.46 mg/dL — ABNORMAL HIGH (ref 0.61–1.24)
GFR calc non Af Amer: 18 mL/min — ABNORMAL LOW (ref >60)
GFR, EST AFRICAN AMERICAN: 21 mL/min — AB (ref >60)
Glucose, Bld: 127 mg/dL — ABNORMAL HIGH (ref 65–99)
PHOSPHORUS: 2.8 mg/dL (ref 2.5–4.6)
Potassium: 3.6 mmol/L (ref 3.5–5.1)
SODIUM: 146 mmol/L — AB (ref 135–145)

## 2015-04-07 LAB — PHOSPHORUS: Phosphorus: 3.9 mg/dL (ref 2.5–4.6)

## 2015-04-07 LAB — ALBUMIN: Albumin: 3.1 g/dL — ABNORMAL LOW (ref 3.5–5.0)

## 2015-04-07 LAB — CALCIUM, IONIZED: Calcium, Ionized, Serum: 4.2 mg/dL — ABNORMAL LOW (ref 4.5–5.6)

## 2015-04-07 LAB — MAGNESIUM: MAGNESIUM: 2.1 mg/dL (ref 1.7–2.4)

## 2015-04-07 MED ORDER — SODIUM BICARBONATE 8.4 % IV SOLN
INTRAVENOUS | Status: DC
  Administered 2015-04-07 – 2015-04-10 (×6): via INTRAVENOUS
  Filled 2015-04-07 (×13): qty 50

## 2015-04-07 MED ORDER — SENNOSIDES 8.8 MG/5ML PO SYRP: 5.0000 mL | ORAL_SOLUTION | Freq: Two times a day (BID) | ORAL | Status: DC | PRN

## 2015-04-07 MED ORDER — POLYETHYLENE GLYCOL 3350 17 G PO PACK
17.0000 g | PACK | Freq: Every day | ORAL | Status: DC
  Administered 2015-04-07 – 2015-04-11 (×5): 17 g via ORAL
  Filled 2015-04-07 (×5): qty 1

## 2015-04-07 MED ORDER — DOXERCALCIFEROL 4 MCG/2ML IV SOLN
3.0000 ug | INTRAVENOUS | Status: DC
  Administered 2015-04-07 – 2015-04-14 (×4): 3 ug via INTRAVENOUS
  Filled 2015-04-07 (×6): qty 2

## 2015-04-07 MED ORDER — HYDROCORTISONE NA SUCCINATE PF 100 MG IJ SOLR
50.0000 mg | Freq: Two times a day (BID) | INTRAMUSCULAR | Status: DC
  Administered 2015-04-07 – 2015-04-13 (×12): 50 mg via INTRAVENOUS
  Filled 2015-04-07 (×12): qty 2

## 2015-04-07 NOTE — Progress Notes (Signed)
   04/07/15 1224  Clinical Encounter Type  Visited With Patient and family together;Health care provider  Visit Type Initial;Spiritual support;Critical Care  Referral From Nurse  Spiritual Encounters  Spiritual Needs Prayer;Emotional  Stress Factors  Family Stress Factors Health changes   Chaplain responded to a request to visit with and support the patient and patient's sister. Chaplain offered support, assisted with medical consultation, and offered prayer for the family. Chaplain support available as needed.   Alda Ponder, Chaplain  04/07/2015 12:25 PM

## 2015-04-07 NOTE — Consult Note (Signed)
ENT CONSULT:  Reason for Consult: Recurrent respiratory failure Referring Physician: CCM  Gary Rivera is an 55 y.o. male.  HPI: Pt adm for respiratory failure requiring intubation for vent and airway management. Multiple intubations over the last several weeks.  Past Medical History  Diagnosis Date  . ESRD on hemodialysis ([place])     Started dialysis with PD in 2013 and switched to HD in spring 2014 because of fungal peritonitis.  Takes dialysis at home 5 days a week approx 3h 15 min each session.     . CHF (congestive heart failure) ([place])   . Peritoneal dialysis catheter in place [facility])   . Anemia     patient reporats that last hgb 7.9 a week ago  . Anxiety state, unspecified   . Hypertension     Past Surgical History  Procedure Laterality Date  . Appendectomy    . Peritoneal catheter insertion    . Tee without cardioversion N/A 07/11/2013    Procedure: TRANSESOPHAGEAL ECHOCARDIOGRAM (TEE);  Surgeon: Fay Records, MD;  Location: [facility] ENDOSCOPY;  Service: Cardiovascular;  Laterality: N/A;  . Cardioversion N/A 07/11/2013    Procedure: CARDIOVERSION;  Surgeon: Fay Records, MD;  Location: [place];  Service: Cardiovascular;  Laterality: N/A;  . Knee arthroscopy Right 05/18/2014    Procedure: ARTHROSCOPIC WASHOUT OF RIGHT KNEE;  Surgeon: Renette Butters, MD;  Location: [place];  Service: Orthopedics;  Laterality: Right;  . Av fistula placement Left 02/18/2015    Procedure: Left Brachial Cephalic ARTERIOVENOUS FISTULA CREATION;  Surgeon: Elam Dutch, MD;  Location: [place];  Service: Vascular;  Laterality: Left;  . Insertion of dialysis catheter Right 02/18/2015    Procedure: INSERTION OF DIALYSIS CATHETER Right Internal Jugular;  Surgeon: Elam Dutch, MD;  Location: [facility] OR;  Service: Vascular;  Laterality: Right;  . Central venous catheter insertion      Family History  Problem Relation Age of Onset  . Diabetes Mother   . Hypertension Mother   . Hypertension Sister   .  Asthma Sister     Social History:  reports that he has never smoked. He has never used smokeless tobacco. He reports that he does not drink alcohol or use illicit drugs.  Allergies:  Allergies  Allergen Reactions  . Renvela [Sevelamer] Nausea And Vomiting    Medications: I have reviewed the patient's current medications.  Results for orders placed or performed during the hospital encounter of 03/29/15 (from the past 48 hour(s))  Glucose, capillary     Status: None   Collection Time: 04/05/15  7:47 PM  Result Value Ref Range   Glucose-Capillary 69 65 - 99 mg/dL   Comment 1 Capillary Specimen   Glucose, capillary     Status: None   Collection Time: 04/05/15  9:14 PM  Result Value Ref Range   Glucose-Capillary 82 65 - 99 mg/dL   Comment 1 Capillary Specimen   Glucose, capillary     Status: None   Collection Time: 04/06/15 12:09 AM  Result Value Ref Range   Glucose-Capillary 90 65 - 99 mg/dL   Comment 1 Capillary Specimen   I-STAT, chem 8     Status: Abnormal   Collection Time: 04/06/15  3:06 AM  Result Value Ref Range   Sodium 146 (H) 135 - 145 mmol/L   Potassium 3.3 (L) 3.5 - 5.1 mmol/L   Chloride 112 (H) 101 - 111 mmol/L   BUN 28 (H) 6 - 20 mg/dL   Creatinine, Ser  2.00 (H) 0.61 - 1.24 mg/dL   Glucose, Bld 147 (H) 65 - 99 mg/dL   Calcium, Ion 0.45 (LL) 1.12 - 1.23 mmol/L   TCO2 16 0 - 100 mmol/L   Hemoglobin 12.2 (L) 13.0 - 17.0 g/dL   HCT 36.0 (L) 39.0 - 52.0 %  I-STAT, chem 8     Status: Abnormal   Collection Time: 04/06/15  3:28 AM  Result Value Ref Range   Sodium 142 135 - 145 mmol/L   Potassium 4.2 3.5 - 5.1 mmol/L   Chloride 106 101 - 111 mmol/L   BUN 38 (H) 6 - 20 mg/dL   Creatinine, Ser 3.40 (H) 0.61 - 1.24 mg/dL   Glucose, Bld 133 (H) 65 - 99 mg/dL   Calcium, Ion 1.08 (L) 1.12 - 1.23 mmol/L   TCO2 25 0 - 100 mmol/L   Hemoglobin 12.6 (L) 13.0 - 17.0 g/dL   HCT 37.0 (L) 39.0 - 52.0 %  Glucose, capillary     Status: Abnormal   Collection Time: 04/06/15   4:23 AM  Result Value Ref Range   Glucose-Capillary 120 (H) 65 - 99 mg/dL   Comment 1 Venous Specimen   I-STAT, chem 8     Status: Abnormal   Collection Time: 04/06/15  5:13 AM  Result Value Ref Range   Sodium 147 (H) 135 - 145 mmol/L   Potassium 3.5 3.5 - 5.1 mmol/L   Chloride 114 (H) 101 - 111 mmol/L   BUN 28 (H) 6 - 20 mg/dL   Creatinine, Ser 2.10 (H) 0.61 - 1.24 mg/dL   Glucose, Bld 152 (H) 65 - 99 mg/dL   Calcium, Ion 0.42 (LL) 1.12 - 1.23 mmol/L   TCO2 16 0 - 100 mmol/L   Hemoglobin 12.9 (L) 13.0 - 17.0 g/dL   HCT 38.0 (L) 39.0 - 52.0 %  I-STAT, chem 8     Status: Abnormal   Collection Time: 04/06/15  5:16 AM  Result Value Ref Range   Sodium 141 135 - 145 mmol/L   Potassium 4.0 3.5 - 5.1 mmol/L   Chloride 108 101 - 111 mmol/L   BUN 37 (H) 6 - 20 mg/dL   Creatinine, Ser 3.20 (H) 0.61 - 1.24 mg/dL   Glucose, Bld 127 (H) 65 - 99 mg/dL   Calcium, Ion 1.07 (L) 1.12 - 1.23 mmol/L   TCO2 23 0 - 100 mmol/L   Hemoglobin 11.9 (L) 13.0 - 17.0 g/dL   HCT 35.0 (L) 39.0 - 52.0 %  Magnesium     Status: None   Collection Time: 04/06/15  6:03 AM  Result Value Ref Range   Magnesium 2.4 1.7 - 2.4 mg/dL  Renal function panel     Status: Abnormal   Collection Time: 04/06/15  6:03 AM  Result Value Ref Range   Sodium 141 135 - 145 mmol/L   Potassium 3.9 3.5 - 5.1 mmol/L   Chloride 109 101 - 111 mmol/L   CO2 23 22 - 32 mmol/L   Glucose, Bld 123 (H) 65 - 99 mg/dL   BUN 36 (H) 6 - 20 mg/dL   Creatinine, Ser 3.28 (H) 0.61 - 1.24 mg/dL   Calcium 8.7 (L) 8.9 - 10.3 mg/dL   Phosphorus 2.6 2.5 - 4.6 mg/dL   Albumin 3.2 (L) 3.5 - 5.0 g/dL   GFR calc non Af Amer 20 (L) >60 mL/min   GFR calc Af Amer 23 (L) >60 mL/min    Comment: (NOTE) The eGFR has been calculated using  the CKD EPI equation. This calculation has not been validated in all clinical situations. eGFR's persistently <60 mL/min signify possible Chronic Kidney Disease.    Anion gap 9 5 - 15  Calcium, ionized     Status: Abnormal    Collection Time: 04/06/15  6:03 AM  Result Value Ref Range   Calcium, Ionized, Serum 4.2 (L) 4.5 - 5.6 mg/dL    Comment: (NOTE) Performed At: [facility] [place], [place] [id number] Lindon Romp MD [id number]   I-STAT, [place] Clap 8     Status: Abnormal   Collection Time: 04/06/15  7:01 AM  Result Value Ref Range   Sodium 147 (H) 135 - 145 mmol/L   Potassium 3.6 3.5 - 5.1 mmol/L   Chloride 114 (H) 101 - 111 mmol/L   BUN 28 (H) 6 - 20 mg/dL   Creatinine, Ser 2.10 (H) 0.61 - 1.24 mg/dL   Glucose, Bld 155 (H) 65 - 99 mg/dL   Calcium, Ion 0.42 (LL) 1.12 - 1.23 mmol/L   TCO2 15 0 - 100 mmol/L   Hemoglobin 13.3 13.0 - 17.0 g/dL   HCT 39.0 39.0 - 52.0 %  I-STAT, chem 8     Status: Abnormal   Collection Time: 04/06/15  7:04 AM  Result Value Ref Range   Sodium 142 135 - 145 mmol/L   Potassium 4.0 3.5 - 5.1 mmol/L   Chloride 109 101 - 111 mmol/L   BUN 37 (H) 6 - 20 mg/dL   Creatinine, Ser 3.00 (H) 0.61 - 1.24 mg/dL   Glucose, Bld 126 (H) 65 - 99 mg/dL   Calcium, Ion 1.05 (L) 1.12 - 1.23 mmol/L   TCO2 21 0 - 100 mmol/L   Hemoglobin 12.2 (L) 13.0 - 17.0 g/dL   HCT 36.0 (L) 39.0 - 52.0 %  Glucose, capillary     Status: Abnormal   Collection Time: 04/06/15  7:51 AM  Result Value Ref Range   Glucose-Capillary 102 (H) 65 - 99 mg/dL  I-STAT, chem 8     Status: Abnormal   Collection Time: 04/06/15  8:56 AM  Result Value Ref Range   Sodium 148 (H) 135 - 145 mmol/L   Potassium 3.5 3.5 - 5.1 mmol/L   Chloride 113 (H) 101 - 111 mmol/L   BUN 29 (H) 6 - 20 mg/dL   Creatinine, Ser 2.30 (H) 0.61 - 1.24 mg/dL   Glucose, Bld 155 (H) 65 - 99 mg/dL   Calcium, Ion 0.41 (LL) 1.12 - 1.23 mmol/L   TCO2 17 0 - 100 mmol/L   Hemoglobin 13.6 13.0 - 17.0 g/dL   HCT 40.0 39.0 - 52.0 %  I-STAT, chem 8     Status: Abnormal   Collection Time: 04/06/15  8:59 AM  Result Value Ref Range   Sodium 143 135 - 145 mmol/L   Potassium 3.9 3.5 - 5.1 mmol/L   Chloride 110 101 - 111 mmol/L    BUN 36 (H) 6 - 20 mg/dL   Creatinine, Ser 3.00 (H) 0.61 - 1.24 mg/dL   Glucose, Bld 115 (H) 65 - 99 mg/dL   Calcium, Ion 1.10 (L) 1.12 - 1.23 mmol/L   TCO2 22 0 - 100 mmol/L   Hemoglobin 12.6 (L) 13.0 - 17.0 g/dL   HCT 37.0 (L) 39.0 - 52.0 %  Glucose, capillary     Status: Abnormal   Collection Time: 04/06/15 11:16 AM  Result Value Ref Range   Glucose-Capillary 129 (H) 65 - 99 mg/dL  Comment 1 Capillary Specimen   I-STAT, chem 8     Status: Abnormal   Collection Time: 04/06/15  1:43 PM  Result Value Ref Range   Sodium 148 (H) 135 - 145 mmol/L   Potassium 3.7 3.5 - 5.1 mmol/L   Chloride 117 (H) 101 - 111 mmol/L   BUN 27 (H) 6 - 20 mg/dL   Creatinine, Ser 2.00 (H) 0.61 - 1.24 mg/dL   Glucose, Bld 141 (H) 65 - 99 mg/dL   Calcium, Ion 0.41 (LL) 1.12 - 1.23 mmol/L   TCO2 15 0 - 100 mmol/L   Hemoglobin 13.9 13.0 - 17.0 g/dL   HCT 41.0 39.0 - 52.0 %   Comment VALUES EXPECTED, NO REPEAT   I-STAT, chem 8     Status: Abnormal   Collection Time: 04/06/15  1:49 PM  Result Value Ref Range   Sodium 146 (H) 135 - 145 mmol/L   Potassium 3.9 3.5 - 5.1 mmol/L   Chloride 113 (H) 101 - 111 mmol/L   BUN 35 (H) 6 - 20 mg/dL   Creatinine, Ser 2.80 (H) 0.61 - 1.24 mg/dL   Glucose, Bld 105 (H) 65 - 99 mg/dL   Calcium, Ion 1.07 (L) 1.12 - 1.23 mmol/L   TCO2 21 0 - 100 mmol/L   Hemoglobin 12.2 (L) 13.0 - 17.0 g/dL   HCT 36.0 (L) 39.0 - 52.0 %  Glucose, capillary     Status: Abnormal   Collection Time: 04/06/15  3:42 PM  Result Value Ref Range   Glucose-Capillary 105 (H) 65 - 99 mg/dL   Comment 1 Venous Specimen   Renal function panel (daily at 1600)     Status: Abnormal   Collection Time: 04/06/15  3:46 PM  Result Value Ref Range   Sodium 144 135 - 145 mmol/L   Potassium 4.2 3.5 - 5.1 mmol/L   Chloride 112 (H) 101 - 111 mmol/L   CO2 21 (L) 22 - 32 mmol/L   Glucose, Bld 117 (H) 65 - 99 mg/dL   BUN 35 (H) 6 - 20 mg/dL   Creatinine, Ser 3.17 (H) 0.61 - 1.24 mg/dL   Calcium 8.6 (L) 8.9 -  10.3 mg/dL   Phosphorus 3.8 2.5 - 4.6 mg/dL   Albumin 3.2 (L) 3.5 - 5.0 g/dL   GFR calc non Af Amer 21 (L) >60 mL/min   GFR calc Af Amer 24 (L) >60 mL/min    Comment: (NOTE) The eGFR has been calculated using the CKD EPI equation. This calculation has not been validated in all clinical situations. eGFR's persistently <60 mL/min signify possible Chronic Kidney Disease.    Anion gap 11 5 - 15  I-STAT, chem 8     Status: Abnormal   Collection Time: 04/06/15  4:55 PM  Result Value Ref Range   Sodium 149 (H) 135 - 145 mmol/L   Potassium 3.8 3.5 - 5.1 mmol/L   Chloride 116 (H) 101 - 111 mmol/L   BUN 29 (H) 6 - 20 mg/dL   Creatinine, Ser 2.20 (H) 0.61 - 1.24 mg/dL   Glucose, Bld 143 (H) 65 - 99 mg/dL   Calcium, Ion 0.50 (LL) 1.12 - 1.23 mmol/L   TCO2 17 0 - 100 mmol/L   Hemoglobin 14.3 13.0 - 17.0 g/dL   HCT 42.0 39.0 - 52.0 %   Comment VALUES EXPECTED, NO REPEAT   I-STAT, chem 8     Status: Abnormal   Collection Time: 04/06/15  5:06 PM  Result Value Ref Range  Sodium 146 (H) 135 - 145 mmol/L   Potassium 4.0 3.5 - 5.1 mmol/L   Chloride 114 (H) 101 - 111 mmol/L   BUN 35 (H) 6 - 20 mg/dL   Creatinine, Ser 2.80 (H) 0.61 - 1.24 mg/dL   Glucose, Bld 110 (H) 65 - 99 mg/dL   Calcium, Ion 1.09 (L) 1.12 - 1.23 mmol/L   TCO2 21 0 - 100 mmol/L   Hemoglobin 12.6 (L) 13.0 - 17.0 g/dL   HCT 37.0 (L) 39.0 - 52.0 %  Glucose, capillary     Status: None   Collection Time: 04/06/15  7:41 PM  Result Value Ref Range   Glucose-Capillary 91 65 - 99 mg/dL   Comment 1 Capillary Specimen   Glucose, capillary     Status: Abnormal   Collection Time: 04/07/15  1:13 AM  Result Value Ref Range   Glucose-Capillary 100 (H) 65 - 99 mg/dL   Comment 1 Venous Specimen   I-STAT, chem 8     Status: Abnormal   Collection Time: 04/07/15  2:10 AM  Result Value Ref Range   Sodium 149 (H) 135 - 145 mmol/L   Potassium 3.6 3.5 - 5.1 mmol/L   Chloride 120 (H) 101 - 111 mmol/L   BUN 34 (H) 6 - 20 mg/dL    Creatinine, Ser 2.40 (H) 0.61 - 1.24 mg/dL   Glucose, Bld 128 (H) 65 - 99 mg/dL   Calcium, Ion 0.49 (LL) 1.12 - 1.23 mmol/L   TCO2 14 0 - 100 mmol/L   Hemoglobin 13.3 13.0 - 17.0 g/dL   HCT 39.0 39.0 - 52.0 %   Comment NOTIFIED PHYSICIAN   I-STAT, chem 8     Status: Abnormal   Collection Time: 04/07/15  2:16 AM  Result Value Ref Range   Sodium 145 135 - 145 mmol/L   Potassium 4.2 3.5 - 5.1 mmol/L   Chloride 114 (H) 101 - 111 mmol/L   BUN 47 (H) 6 - 20 mg/dL   Creatinine, Ser 3.90 (H) 0.61 - 1.24 mg/dL   Glucose, Bld 109 (H) 65 - 99 mg/dL   Calcium, Ion 1.19 1.12 - 1.23 mmol/L   TCO2 20 0 - 100 mmol/L   Hemoglobin 12.6 (L) 13.0 - 17.0 g/dL   HCT 37.0 (L) 39.0 - 52.0 %  I-STAT, chem 8     Status: Abnormal   Collection Time: 04/07/15  4:13 AM  Result Value Ref Range   Sodium 150 (H) 135 - 145 mmol/L   Potassium 3.5 3.5 - 5.1 mmol/L   Chloride 119 (H) 101 - 111 mmol/L   BUN 31 (H) 6 - 20 mg/dL   Creatinine, Ser 2.40 (H) 0.61 - 1.24 mg/dL   Glucose, Bld 136 (H) 65 - 99 mg/dL   Calcium, Ion 0.44 (LL) 1.12 - 1.23 mmol/L   TCO2 13 0 - 100 mmol/L   Hemoglobin 12.2 (L) 13.0 - 17.0 g/dL   HCT 36.0 (L) 39.0 - 52.0 %   Comment NOTIFIED PHYSICIAN   Glucose, capillary     Status: Abnormal   Collection Time: 04/07/15  4:16 AM  Result Value Ref Range   Glucose-Capillary 102 (H) 65 - 99 mg/dL   Comment 1 Venous Specimen   I-STAT, chem 8     Status: Abnormal   Collection Time: 04/07/15  4:19 AM  Result Value Ref Range   Sodium 146 (H) 135 - 145 mmol/L   Potassium 4.2 3.5 - 5.1 mmol/L   Chloride 115 (H) 101 -  111 mmol/L   BUN 45 (H) 6 - 20 mg/dL   Creatinine, Ser 3.80 (H) 0.61 - 1.24 mg/dL   Glucose, Bld 108 (H) 65 - 99 mg/dL   Calcium, Ion 1.16 1.12 - 1.23 mmol/L   TCO2 19 0 - 100 mmol/L   Hemoglobin 11.9 (L) 13.0 - 17.0 g/dL   HCT 35.0 (L) 39.0 - 52.0 %  Magnesium     Status: None   Collection Time: 04/07/15  4:30 AM  Result Value Ref Range   Magnesium 2.1 1.7 - 2.4 mg/dL  CBC      Status: Abnormal   Collection Time: 04/07/15  4:30 AM  Result Value Ref Range   WBC 10.3 4.0 - 10.5 K/uL   RBC 3.46 (L) 4.22 - 5.81 MIL/uL   Hemoglobin 9.8 (L) 13.0 - 17.0 g/dL    Comment: DELTA CHECK NOTED REPEATED TO VERIFY    HCT 35.1 (L) 39.0 - 52.0 %   MCV 101.4 (H) 78.0 - 100.0 fL   MCH 28.3 26.0 - 34.0 pg   MCHC 27.9 (L) 30.0 - 36.0 g/dL   RDW 19.1 (H) 11.5 - 15.5 %   Platelets 125 (L) 150 - 400 K/uL    Comment: PLATELET COUNT CONFIRMED BY SMEAR  Basic metabolic panel     Status: Abnormal   Collection Time: 04/07/15  4:30 AM  Result Value Ref Range   Sodium 145 135 - 145 mmol/L   Potassium 4.2 3.5 - 5.1 mmol/L   Chloride 114 (H) 101 - 111 mmol/L   CO2 20 (L) 22 - 32 mmol/L   Glucose, Bld 110 (H) 65 - 99 mg/dL   BUN 44 (H) 6 - 20 mg/dL   Creatinine, Ser 3.82 (H) 0.61 - 1.24 mg/dL    Comment: DELTA CHECK NOTED   Calcium 8.9 8.9 - 10.3 mg/dL   GFR calc non Af Amer 16 (L) >60 mL/min   GFR calc Af Amer 19 (L) >60 mL/min    Comment: (NOTE) The eGFR has been calculated using the CKD EPI equation. This calculation has not been validated in all clinical situations. eGFR's persistently <60 mL/min signify possible Chronic Kidney Disease.    Anion gap 11 5 - 15  Phosphorus     Status: None   Collection Time: 04/07/15  4:30 AM  Result Value Ref Range   Phosphorus 3.9 2.5 - 4.6 mg/dL  Albumin     Status: Abnormal   Collection Time: 04/07/15  4:30 AM  Result Value Ref Range   Albumin 3.1 (L) 3.5 - 5.0 g/dL  I-STAT 3, arterial blood gas (G3+)     Status: Abnormal   Collection Time: 04/07/15  5:14 AM  Result Value Ref Range   pH, Arterial 7.158 (LL) 7.350 - 7.450   pCO2 arterial 46.7 (H) 35.0 - 45.0 mmHg   pO2, Arterial 63.0 (L) 80.0 - 100.0 mmHg   Bicarbonate 16.7 (L) 20.0 - 24.0 mEq/L   TCO2 18 0 - 100 mmol/L   O2 Saturation 85.0 %   Acid-base deficit 12.0 (H) 0.0 - 2.0 mmol/L   Patient temperature 97.9 F    Sample type ARTERIAL    Comment NOTIFIED PHYSICIAN    I-STAT, chem 8     Status: Abnormal   Collection Time: 04/07/15  5:58 AM  Result Value Ref Range   Sodium 151 (H) 135 - 145 mmol/L   Potassium 4.0 3.5 - 5.1 mmol/L   Chloride 121 (H) 101 - 111 mmol/L   BUN  34 (H) 6 - 20 mg/dL   Creatinine, Ser 2.50 (H) 0.61 - 1.24 mg/dL   Glucose, Bld 122 (H) 65 - 99 mg/dL   Calcium, Ion 0.57 (LL) 1.12 - 1.23 mmol/L   TCO2 14 0 - 100 mmol/L   Hemoglobin 13.3 13.0 - 17.0 g/dL   HCT 39.0 39.0 - 52.0 %   Comment NOTIFIED PHYSICIAN   I-STAT, chem 8     Status: Abnormal   Collection Time: 04/07/15  6:04 AM  Result Value Ref Range   Sodium 145 135 - 145 mmol/L   Potassium 4.1 3.5 - 5.1 mmol/L   Chloride 116 (H) 101 - 111 mmol/L   BUN 44 (H) 6 - 20 mg/dL   Creatinine, Ser 3.60 (H) 0.61 - 1.24 mg/dL   Glucose, Bld 111 (H) 65 - 99 mg/dL   Calcium, Ion 1.18 1.12 - 1.23 mmol/L   TCO2 18 0 - 100 mmol/L   Hemoglobin 12.2 (L) 13.0 - 17.0 g/dL   HCT 36.0 (L) 39.0 - 52.0 %  Blood gas, arterial     Status: Abnormal   Collection Time: 04/07/15  6:15 AM  Result Value Ref Range   FIO2 0.80    Delivery systems VENTILATOR    Mode PRESSURE REGULATED VOLUME CONTROL    VT 600 mL   LHR 30 resp/min   Peep/cpap 5.0 cm H20   pH, Arterial 7.199 (LL) 7.350 - 7.450    Comment: CRITICAL RESULT CALLED TO, READ BACK BY AND VERIFIED WITH: Anselm Pancoast, RN AT [phone] BY Berneice Gandy, RRT, RCP ON 04/07/2015     pCO2 arterial 27.2 (L) 35.0 - 45.0 mmHg   pO2, Arterial 137 (H) 80.0 - 100.0 mmHg   Bicarbonate 10.3 (L) 20.0 - 24.0 mEq/L   TCO2 11.1 0 - 100 mmol/L   Acid-base deficit 16.3 (H) 0.0 - 2.0 mmol/L   O2 Saturation 98.5 %   Patient temperature 97.9    Collection site RIGHT RADIAL    Drawn by [id number]    Sample type ARTERIAL DRAW   I-STAT, chem 8     Status: Abnormal   Collection Time: 04/07/15  8:00 AM  Result Value Ref Range   Sodium 150 (H) 135 - 145 mmol/L   Potassium 3.7 3.5 - 5.1 mmol/L   Chloride 119 (H) 101 - 111 mmol/L   BUN 32 (H) 6 - 20 mg/dL    Creatinine, Ser 2.40 (H) 0.61 - 1.24 mg/dL   Glucose, Bld 139 (H) 65 - 99 mg/dL   Calcium, Ion 0.45 (LL) 1.12 - 1.23 mmol/L   TCO2 14 0 - 100 mmol/L   Hemoglobin 13.9 13.0 - 17.0 g/dL   HCT 41.0 39.0 - 52.0 %   Comment NOTIFIED PHYSICIAN   Glucose, capillary     Status: None   Collection Time: 04/07/15  8:05 AM  Result Value Ref Range   Glucose-Capillary 89 65 - 99 mg/dL   Comment 1 Venous Specimen   I-STAT, chem 8     Status: Abnormal   Collection Time: 04/07/15  8:07 AM  Result Value Ref Range   Sodium 148 (H) 135 - 145 mmol/L   Potassium 4.5 3.5 - 5.1 mmol/L   Chloride 117 (H) 101 - 111 mmol/L   BUN 43 (H) 6 - 20 mg/dL   Creatinine, Ser 3.40 (H) 0.61 - 1.24 mg/dL   Glucose, Bld 103 (H) 65 - 99 mg/dL   Calcium, Ion 1.18 1.12 - 1.23 mmol/L   TCO2 20 0 -  100 mmol/L   Hemoglobin 13.3 13.0 - 17.0 g/dL   HCT 39.0 39.0 - 52.0 %  I-STAT, chem 8     Status: Abnormal   Collection Time: 04/07/15  9:45 AM  Result Value Ref Range   Sodium 150 (H) 135 - 145 mmol/L   Potassium 3.7 3.5 - 5.1 mmol/L   Chloride 118 (H) 101 - 111 mmol/L   BUN 32 (H) 6 - 20 mg/dL   Creatinine, Ser 2.70 (H) 0.61 - 1.24 mg/dL   Glucose, Bld 152 (H) 65 - 99 mg/dL   Calcium, Ion 0.44 (LL) 1.12 - 1.23 mmol/L   TCO2 14 0 - 100 mmol/L   Hemoglobin 13.3 13.0 - 17.0 g/dL   HCT 39.0 39.0 - 52.0 %   Comment NOTIFIED PHYSICIAN   I-STAT, chem 8     Status: Abnormal   Collection Time: 04/07/15  9:57 AM  Result Value Ref Range   Sodium 147 (H) 135 - 145 mmol/L   Potassium 4.0 3.5 - 5.1 mmol/L   Chloride 117 (H) 101 - 111 mmol/L   BUN 39 (H) 6 - 20 mg/dL   Creatinine, Ser 3.50 (H) 0.61 - 1.24 mg/dL   Glucose, Bld 112 (H) 65 - 99 mg/dL   Calcium, Ion 1.16 1.12 - 1.23 mmol/L   TCO2 18 0 - 100 mmol/L   Hemoglobin 12.2 (L) 13.0 - 17.0 g/dL   HCT 36.0 (L) 39.0 - 52.0 %  I-STAT, chem 8     Status: Abnormal   Collection Time: 04/07/15 12:00 PM  Result Value Ref Range   Sodium 148 (H) 135 - 145 mmol/L   Potassium 4.0  3.5 - 5.1 mmol/L   Chloride 119 (H) 101 - 111 mmol/L   BUN 41 (H) 6 - 20 mg/dL   Creatinine, Ser 3.70 (H) 0.61 - 1.24 mg/dL   Glucose, Bld 86 65 - 99 mg/dL   Calcium, Ion 1.22 1.12 - 1.23 mmol/L   TCO2 19 0 - 100 mmol/L   Hemoglobin 11.6 (L) 13.0 - 17.0 g/dL   HCT 34.0 (L) 39.0 - 52.0 %  I-STAT, chem 8     Status: Abnormal   Collection Time: 04/07/15 12:09 PM  Result Value Ref Range   Sodium 152 (H) 135 - 145 mmol/L   Potassium 3.5 3.5 - 5.1 mmol/L   Chloride 121 (H) 101 - 111 mmol/L   BUN 31 (H) 6 - 20 mg/dL   Creatinine, Ser 2.30 (H) 0.61 - 1.24 mg/dL   Glucose, Bld 121 (H) 65 - 99 mg/dL   Calcium, Ion 0.44 (LL) 1.12 - 1.23 mmol/L   TCO2 13 0 - 100 mmol/L   Hemoglobin 13.3 13.0 - 17.0 g/dL   HCT 39.0 39.0 - 52.0 %   Comment NOTIFIED PHYSICIAN   Glucose, capillary     Status: None   Collection Time: 04/07/15 12:32 PM  Result Value Ref Range   Glucose-Capillary 72 65 - 99 mg/dL   Comment 1 Capillary Specimen   I-STAT, chem 8     Status: Abnormal   Collection Time: 04/07/15  2:12 PM  Result Value Ref Range   Sodium 147 (H) 135 - 145 mmol/L   Potassium 3.7 3.5 - 5.1 mmol/L   Chloride 119 (H) 101 - 111 mmol/L   BUN 36 (H) 6 - 20 mg/dL   Creatinine, Ser 3.40 (H) 0.61 - 1.24 mg/dL   Glucose, Bld 106 (H) 65 - 99 mg/dL   Calcium, Ion 1.13 1.12 - 1.23  mmol/L   TCO2 17 0 - 100 mmol/L   Hemoglobin 12.2 (L) 13.0 - 17.0 g/dL   HCT 36.0 (L) 39.0 - 52.0 %  Renal function panel (daily at 1600)     Status: Abnormal   Collection Time: 04/07/15  4:15 PM  Result Value Ref Range   Sodium 146 (H) 135 - 145 mmol/L   Potassium 3.6 3.5 - 5.1 mmol/L   Chloride 116 (H) 101 - 111 mmol/L   CO2 17 (L) 22 - 32 mmol/L   Glucose, Bld 127 (H) 65 - 99 mg/dL   BUN 37 (H) 6 - 20 mg/dL   Creatinine, Ser 3.46 (H) 0.61 - 1.24 mg/dL   Calcium 9.0 8.9 - 10.3 mg/dL   Phosphorus 2.8 2.5 - 4.6 mg/dL   Albumin 3.3 (L) 3.5 - 5.0 g/dL   GFR calc non Af Amer 18 (L) >60 mL/min   GFR calc Af Amer 21 (L) >60  mL/min    Comment: (NOTE) The eGFR has been calculated using the CKD EPI equation. This calculation has not been validated in all clinical situations. eGFR's persistently <60 mL/min signify possible Chronic Kidney Disease.    Anion gap 13 5 - 15  Glucose, capillary     Status: Abnormal   Collection Time: 04/07/15  4:20 PM  Result Value Ref Range   Glucose-Capillary 105 (H) 65 - 99 mg/dL   Comment 1 Venous Specimen   I-STAT, chem 8     Status: Abnormal   Collection Time: 04/07/15  4:22 PM  Result Value Ref Range   Sodium 146 (H) 135 - 145 mmol/L   Potassium 3.6 3.5 - 5.1 mmol/L   Chloride 119 (H) 101 - 111 mmol/L   BUN 37 (H) 6 - 20 mg/dL   Creatinine, Ser 3.40 (H) 0.61 - 1.24 mg/dL   Glucose, Bld 124 (H) 65 - 99 mg/dL   Calcium, Ion 1.16 1.12 - 1.23 mmol/L   TCO2 17 0 - 100 mmol/L   Hemoglobin 12.2 (L) 13.0 - 17.0 g/dL   HCT 36.0 (L) 39.0 - 52.0 %  I-STAT, chem 8     Status: Abnormal   Collection Time: 04/07/15  4:35 PM  Result Value Ref Range   Sodium 152 (H) 135 - 145 mmol/L   Potassium 3.5 3.5 - 5.1 mmol/L   Chloride 121 (H) 101 - 111 mmol/L   BUN 32 (H) 6 - 20 mg/dL   Creatinine, Ser 2.60 (H) 0.61 - 1.24 mg/dL   Glucose, Bld 125 (H) 65 - 99 mg/dL   Calcium, Ion 0.55 (LL) 1.12 - 1.23 mmol/L   TCO2 14 0 - 100 mmol/L   Hemoglobin 13.6 13.0 - 17.0 g/dL   HCT 40.0 39.0 - 52.0 %   Comment NOTIFIED PHYSICIAN   I-STAT, chem 8     Status: Abnormal   Collection Time: 04/07/15  6:03 PM  Result Value Ref Range   Sodium 152 (H) 135 - 145 mmol/L   Potassium 3.2 (L) 3.5 - 5.1 mmol/L   Chloride 119 (H) 101 - 111 mmol/L   BUN 29 (H) 6 - 20 mg/dL   Creatinine, Ser 2.20 (H) 0.61 - 1.24 mg/dL   Glucose, Bld 150 (H) 65 - 99 mg/dL   Calcium, Ion 0.44 (LL) 1.12 - 1.23 mmol/L   TCO2 13 0 - 100 mmol/L   Hemoglobin 13.9 13.0 - 17.0 g/dL   HCT 41.0 39.0 - 52.0 %   Comment NOTIFIED PHYSICIAN     Dg Chest  Port 1 View  04/07/2015  CLINICAL DATA:  Endotracheal tube EXAM: PORTABLE CHEST  1 VIEW COMPARISON:  Yesterday FINDINGS: Low volumes with bibasilar atelectasis based on chest CT 04/02/2015. Opacity has increased, especially on the right where there is flat morphology. Marked cardiopericardial enlargement without pericardial effusion on comparison CT. Endotracheal tube in good position with tip at the clavicular heads. The orogastric tube continues to the stomach at least. Bilateral IJ catheters, tips in stable unremarkable position. No effusion or pneumothorax. Pulmonary venous congestion. IMPRESSION: 1. Stable positioning of tubes and lines. 2. Worsening bibasilar opacity, atelectasis based on recent chest CT. 3. Low volumes and pulmonary venous congestion. Electronically Signed   By: Monte Fantasia M.D.   On: 04/07/2015 07:46   Dg Chest Port 1 View  04/06/2015  CLINICAL DATA:  55 year old male with respiratory failure EXAM: PORTABLE CHEST 1 VIEW COMPARISON:  Radiograph dated 04/05/2015 FINDINGS: Endotracheal tube above the carina enteric tube coursing towards upper abdomen. Right-sided dialysis catheter and left IJ central line in stable positioning. There is stable cardiomegaly. Low lung volumes. Focal density at the right lung base similar to prior study and may represent atelectasis versus pneumonia. Left lung base opacity appears stable and may represent atelectasis/pneumonia. A small left pleural effusion may be present. No pneumothorax. IMPRESSION: No significant interval change. Electronically Signed   By: Anner Crete M.D.   On: 04/06/2015 04:34    ROS:ROS  Blood pressure 87/53, pulse 29, temperature 98.2 F (36.8 C), temperature source Oral, resp. rate 16, height _0  (1.88 m), weight 133.3 kg (293 lb 14 oz), SpO2 100 %.  PHYSICAL EXAM: General appearance - Pt OT intubated and sedated Neck - Obese, normal ant neck anatomy  Studies Reviewed:none  Assessment/Plan: ENT svc consulted for elective trach for long term airway and vent management. Will schedule -  procedure will be performed by Dr. Constance Holster in [place] on 1/20.  [place], Colleen Donahoe 04/07/2015, 6:43 PM

## 2015-04-07 NOTE — Progress Notes (Signed)
abg collected  

## 2015-04-07 NOTE — Progress Notes (Signed)
PULMONARY / CRITICAL CARE MEDICINE   Name: Gary Rivera MRN: [id number] DOB: 1961/02/14    ADMISSION DATE:  03/29/2015   REFERRING MD:  ED  CHIEF COMPLAINT:  Resp failure  SUBJECTIVE:   CRRT off for most of the day yesterday, ABG this AM reflects metabolic acidosis. Improving. Otherwise, doing well this AM, shivering, attempting to write.   VITAL SIGNS: BP 104/57 mmHg  Pulse 65  Temp(Src) 97.9 F (36.6 C) (Oral)  Resp 30  Ht 6\' 2"  (1.88 m)  Wt 293 lb 14 oz (133.3 kg)  BMI 37.72 kg/m2  SpO2 100%  HEMODYNAMICS: CVP:  [9 mmHg-16 mmHg] 9 mmHg  VENTILATOR SETTINGS: Vent Mode:  [-] PRVC FiO2 (%):  [40 %-80 %] 80 % Set Rate:  [20 bmp-30 bmp] 30 bmp Vt Set:  [600 mL] 600 mL PEEP:  [5 cmH20] 5 cmH20 Pressure Support:  [8 cmH20] 8 cmH20 Plateau Pressure:  [23 cmH20-24 cmH20] 23 cmH20  INTAKE / OUTPUT: I/O last 3 completed shifts: In: 3817.1 [I.V.:2457.1; NG/GT:1110; IV Piggyback:250] Out: 3395 [Other:3395]  PHYSICAL EXAMINATION: General: Acutely ill appearing male, intubated, lying in bed.  Neuro: RASS 0, moves all extremities spontaneously HEENT:  PERRL, moist mucus membranes. ETT in place. RIJ tunneled cath, LIJ CVL.  Cardiovascular:  RRR, no murmurs, gallops, or rubs.  Lungs: Air entry equal bilaterally, coarse, no rales or wheezes.  Abdomen: obese, soft, NT, ND and +BS. Musculoskeletal:  Intact. Skin:  Left antecubital wound noted, right PIV antecubital  LABS:  BMET  Recent Labs Lab 04/06/15 0603  04/06/15 1546  04/07/15 0430 04/07/15 0558 04/07/15 0604  NA 141  < > 144  < > 145 151* 145  K 3.9  < > 4.2  < > 4.2 4.0 4.1  CL 109  < > 112*  < > 114* 121* 116*  CO2 23  --  21*  --  20*  --   --   BUN 36*  < > 35*  < > 44* 34* 44*  CREATININE 3.28*  < > 3.17*  < > 3.82* 2.50* 3.60*  GLUCOSE 123*  < > 117*  < > 110* 122* 111*  < > = values in this interval not displayed. Electrolytes  Recent Labs Lab 04/05/15 0426  04/06/15 0603 04/06/15 1546  04/07/15 0430  CALCIUM 9.2  < > 8.7* 8.6* 8.9  MG 2.6*  --  2.4  --  2.1  PHOS 2.8  < > 2.6 3.8 3.9  < > = values in this interval not displayed. CBC  Recent Labs Lab 04/03/15 0440 04/05/15 0426  04/07/15 0430 04/07/15 0558 04/07/15 0604  WBC 10.1 8.4  --  10.3  --   --   HGB 10.2* 9.9*  < > 9.8* 13.3 12.2*  HCT 32.9* 32.4*  < > 35.1* 39.0 36.0*  PLT 158 137*  --  125*  --   --   < > = values in this interval not displayed. Coag's  Recent Labs Lab 04/03/15 0440 04/04/15 0401 04/05/15 0426  APTT 42* 35 140*   Sepsis Markers  Recent Labs Lab 04/01/15 0930 04/03/15 0440 04/05/15 0426  PROCALCITON 2.57 1.77 1.02   ABG  Recent Labs Lab 04/02/15 0341 04/07/15 0514 04/07/15 0615  PHART 7.412 7.158* 7.199*  PCO2ART 41.6 46.7* 27.2*  PO2ART 64.0* 63.0* 137*   Liver Enzymes  Recent Labs Lab 04/06/15 0603 04/06/15 1546 04/07/15 0430  ALBUMIN 3.2* 3.2* 3.1*    Glucose  Recent Labs Lab 04/06/15  1610 04/06/15 1116 04/06/15 1542 04/06/15 1941 04/07/15 0113 04/07/15 0416  GLUCAP 102* 129* 105* 91 100* 102*    Imaging No results found.   STUDIES:  CT chest 1/12 >> Bibasilar atelectasis CT neck 1/12 >> artifact with movement, basilar consolidation with air bronchograms, no mass  CULTURES: 1/8 Blood x 2 >> neg 1/8 Urine >> not done 1/12 Sputum >> Oral flora 1/11 RVP >> neg  ANTIBIOTICS: 1/8 vanc >> 1/12 1/8 zoysn >> 1/12  SIGNIFICANT EVENTS: 1/6 [facility]charge from [facility] following intubation 1/8 Admitted, intubated 1/9 Extubated 1/11 Re-intubated 1/13-- hypothermia on cvvhd  LINES/TUBES: 1/8 ETT>>01/09>>>1/11>>> LIJ CVL 1/11 >>   DISCUSSION: 55 yo MO AAF non compliant with HD and CPAP and re-presents with hypercarbic and hypoxic resp failure  ASSESSMENT / PLAN:  PULMONARY A: VDRF/Hypercarbic respiratory failure OSA / OHS  Very DIFFICULT AIRWAY required bronchoscopy for intubation by PCCM Bibasilar atelectasis P:   Full vent  support Wean PEEP / FiO2 for sats >92% Daily SBT / WUA Will likely need trach; to discuss with patient and family today Duonebs q4h Upright positioning as tolerated   CARDIOVASCULAR A:  HTN Pulmonary HTN MVR Continued Hypotension, most likely 2/2 sedation Chronic Combined CHF P:  CVP 9 this AM Neo gtt for MAP < 65 Hold antihypertensives Even balance Stress dose steroids; decrease hydrocortisone to 50 q12h  RENAL A:   ESRD - on HD, non-compliant, RIJ tunneled cath Non-Anion Gap Metabolic Acidosis; improving P:   CVVHD per renal, even/negative balance Follow BMP Replace electrolytes as indicated   GASTROINTESTINAL A:   GI PPx P:   Protonix Tube Feeds as tolerated   HEMATOLOGIC A:   Anemia of chronic illness DVT PPx P:  CBC in AM Heparin Gary Rivera  INFECTIOUS A:   Left antecubital wound  Dependent Airspace Disease  P:   Off abx since 1/12 Monitor CBC, fever curve Follow cultures  ENDOCRINE A:   No acute issuese P:   Glucose on BMP  NEUROLOGIC A:   Metabolic Encephalopathy 2/2 hypercarbia Agitation; improved P:   RASS goal: 0, -1 Continue home amitriptyline  Fentanyl gtt, attempt to turn off Versed gtt Continue Seroquel 25 mg daily Haldol prn Daily WUA    FAMILY  - Updates: None at bedside, patient updated on status. Family meeting today regarding tracheostomy.   Gary Chroman, MD PGY-3, Internal Medicine Pager: [phone]  Attending Note:  55 year old male with morbid obesity, ESRD and now presenting with VDRF due to PNA and obstruction.  Patient has been extubated then reintubated for hypercarbic respiratory failure 3 times.  On exam today, lungs with coarse BS diffusely.  Woke patient up, he was appropriate and purposeful, sister was bedside.  Extensive discussion regarding trach/peg.  After discussion, patient is willing to have a trach/peg and go to a SNF.  He is aware that that would mean he will not be able to eat or speak and was ok with that.   In the meantime, will repeat ABG now (pH was 7.2) and adjust vent accordingly.  Will call ENT to trach and IR to PEG.  The patient is critically ill with multiple organ systems failure and requires high complexity decision making for assessment and support, frequent evaluation and titration of therapies, application of advanced monitoring technologies and extensive interpretation of multiple databases.   Critical Care Time devoted to patient care services described in this note is  35  Minutes. This time reflects time of care of this signee Dr Marius Ditch  Nelda Marseille. This critical care time does not reflect procedure time, or teaching time or supervisory time of PA/NP/Med student/Med Resident etc but could involve care discussion time.  Rush Farmer, M.D. [facility] Pulmonary/Critical Care Medicine. Pager: [phone]. After hours pager: [phone].

## 2015-04-07 NOTE — Progress Notes (Signed)
Sister has decided on trach and peg. Dr Molli Knock ok w ltac eval. Both ltach ck on elidg. Dr Molli Knock has told sister [place] a [place]. Await trach placemnt. Cont to follow.

## 2015-04-07 NOTE — Progress Notes (Signed)
eLink Physician-Brief Progress Note Patient Name: Gary Rivera DOB: Jul 14, 1960 MRN: [id number]   Date of Service  04/07/2015  HPI/Events of Note  cvvh dwas off for a long time d today Now abg reflects worsening uncompensated met acidosis  eICU Interventions  cvvhd is runnning now Rate increase, abg to follow pcxr just done to ensure nothing new reversible cusing     Intervention Category Major Interventions: Respiratory failure - evaluation and management  FEINSTEIN,DANIEL J. 04/07/2015, 5:30 AM

## 2015-04-07 NOTE — Progress Notes (Signed)
CRITICAL VALUE ALERT  Critical value received:  PH 7.129  Date of notification:  04/07/15  Time of notification:  0630  Critical value read back: no  Nurse who received alert:  Devota Pace RN  CCMD notified at 0637am no changes made to vent setting. Will continue to monitor pt.

## 2015-04-07 NOTE — Progress Notes (Signed)
Assessment: 1 A/C resp failure - no pulm edema by CXR/ CT. Upper airway issues, OHS +/- bibasilar PNA vs atx by CT. On vent, per CCM. May need trach ?  2 ESRD-Met Acidosis and Hypernatremia---normally [facility]- on CRRT now.  Increase free water with hypotonic bicarb (Outpt Dialysis Orders: [facility] 4.5h 135g 2/3.5 bath R IJ cath Hep 4200) Systemic PTT was too high (140) so heparin was stopped and now on citrate   3 Shock - on pressors 5 Anemia - on Aranesp.  Will hold. 6 Bivent HF LVEF 30-35%, RV dysfxn w pulm htn 7 Depression - current meds 8 Left antecubital wound - seen by VVS 12/15 - has intermittently been on Vanc with wet to dry dressings  Plan - Cont CRRT. Running even to mild neg  Subjective: Interval History:   Objective: Vital signs in last 24 hours: Temp:  [97.4 F (36.3 C)-98.2 F (36.8 C)] 97.8 F (36.6 C) (01/17 0806) Pulse Rate:  [30-106] 96 (01/17 0930) Resp:  [16-34] 30 (01/17 1000) BP: (77-137)/(44-117) 109/70 mmHg (01/17 1000) SpO2:  [94 %-100 %] 96 % (01/17 0930) FiO2 (%):  [40 %-80 %] 70 % (01/17 0815) Weight:  [133.3 kg (293 lb 14 oz)] 133.3 kg (293 lb 14 oz) (01/17 0500) Weight change: -0.1 kg (-3.5 oz)  Intake/Output from previous day: 01/16 0701 - 01/17 0700 In: 2750.7 [I.V.:2155.7; NG/GT:595] Out: 2433  Intake/Output this shift: Total I/O In: 251.1 [I.V.:206.1; NG/GT:45] Out: 352 [Other:352]  General appearance: alert and cooperative Head: Normocephalic, without obvious abnormality, atraumatic RIJ TDC GI: soft, non-tender; bowel sounds normal; no masses,  no organomegaly, protuberant Extremities: extremities normal, atraumatic, no cyanosis or edema  Lab Results:  Recent Labs  04/05/15 0426  04/07/15 0430  04/07/15 0800 04/07/15 0807  WBC 8.4  --  10.3  --   --   --   HGB 9.9*  < > 9.8*  < > 13.9 13.3  HCT 32.4*  < > 35.1*  < > 41.0 39.0  PLT 137*  --  125*  --   --   --   < > = values in this interval not  displayed. BMET:  Recent Labs  04/06/15 1546  04/07/15 0430  04/07/15 0800 04/07/15 0807  NA 144  < > 145  < > 150* 148*  K 4.2  < > 4.2  < > 3.7 4.5  CL 112*  < > 114*  < > 119* 117*  CO2 21*  --  20*  --   --   --   GLUCOSE 117*  < > 110*  < > 139* 103*  BUN 35*  < > 44*  < > 32* 43*  CREATININE 3.17*  < > 3.82*  < > 2.40* 3.40*  CALCIUM 8.6*  --  8.9  --   --   --   < > = values in this interval not displayed. No results for input(s): PTH in the last 72 hours. Iron Studies: No results for input(s): IRON, TIBC, TRANSFERRIN, FERRITIN in the last 72 hours. Studies/Results: Dg Chest Port 1 View  04/07/2015  CLINICAL DATA:  Endotracheal tube EXAM: PORTABLE CHEST 1 VIEW COMPARISON:  Yesterday FINDINGS: Low volumes with bibasilar atelectasis based on chest CT 04/02/2015. Opacity has increased, especially on the right where there is flat morphology. Marked cardiopericardial enlargement without pericardial effusion on comparison CT. Endotracheal tube in good position with tip at the clavicular heads. The orogastric tube continues to the  stomach at least. Bilateral IJ catheters, tips in stable unremarkable position. No effusion or pneumothorax. Pulmonary venous congestion. IMPRESSION: 1. Stable positioning of tubes and lines. 2. Worsening bibasilar opacity, atelectasis based on recent chest CT. 3. Low volumes and pulmonary venous congestion. Electronically Signed   By: Monte Fantasia M.D.   On: 04/07/2015 07:46   Dg Chest Port 1 View  04/06/2015  CLINICAL DATA:  55 year old male with respiratory failure EXAM: PORTABLE CHEST 1 VIEW COMPARISON:  Radiograph dated 04/05/2015 FINDINGS: Endotracheal tube above the carina enteric tube coursing towards upper abdomen. Right-sided dialysis catheter and left IJ central line in stable positioning. There is stable cardiomegaly. Low lung volumes. Focal density at the right lung base similar to prior study and may represent atelectasis versus pneumonia. Left lung  base opacity appears stable and may represent atelectasis/pneumonia. A small left pleural effusion may be present. No pneumothorax. IMPRESSION: No significant interval change. Electronically Signed   By: Anner Crete M.D.   On: 04/06/2015 04:34   Scheduled: . amitriptyline  25 mg Oral QHS  . antiseptic oral rinse  7 mL Mouth Rinse 10 times per day  . chlorhexidine gluconate  15 mL Mouth Rinse BID  . darbepoetin (ARANESP) injection - DIALYSIS  60 mcg Intravenous Q Thu-HD  . doxercalciferol  3 mcg Intravenous Q T,Th,Sa-HD  . feeding supplement (NEPRO CARB STEADY)  1,000 mL Per Tube Q24H  . feeding supplement (PRO-STAT SUGAR FREE 64)  60 mL Per Tube 5 X Daily  . hydrocortisone sod succinate (SOLU-CORTEF) inj  50 mg Intravenous Q12H  . ipratropium-albuterol  3 mL Nebulization Q4H  . multivitamin  1 tablet Oral QHS  . pantoprazole sodium  40 mg Per Tube QHS  . polyethylene glycol  17 g Oral Daily  . QUEtiapine  25 mg Per Tube Daily     LOS: 9 days   Lanika Colgate C 04/07/2015,10:24 AM

## 2015-04-07 NOTE — Progress Notes (Signed)
This RN changed pt's soiled CVC dressing. While removing the old dressing and biopatch the catheter was pulled out a couple centimeters. The dressing change was completed using sterile technique. Elink was called. Ordered placed for stat CXR to verify placement. All IV infusions continued to run without difficulty. Dr. Sung Amabile verified catheter moved ~1cm, but still properly placed.

## 2015-04-07 NOTE — Progress Notes (Signed)
Nutrition Follow-up  DOCUMENTATION CODES:   Obesity unspecified  INTERVENTION:   Continue Nepro @ 15 ml/hr Continue 60 ml Prostat five times per day  Provides: 1648 kcal, 179 grams protein, and 261 ml H2O.  NUTRITION DIAGNOSIS:   Increased nutrient needs related to chronic illness as evidenced by estimated needs. Ongoing.  GOAL:   Provide needs based on ASPEN/SCCM guidelines Met.   MONITOR:   TF tolerance, Skin, I & O's, Vent status, Labs  ASSESSMENT:   55 yo MO AAF non compliant with HD and Cpap and re presents with hypercarbic and hypoxic resp failure. He missed 1 dose of HD after discharge before re-presenting to hospital and being intubated. Opacities on his CXR imaging are concerning for possible pneumonia and given his history of intubation & exposure HCAP is certainly possible.   Patient is currently intubated on ventilator support MV: 18.5 L/min Temp (24hrs), Avg:97.9 F (36.6 C), Min:97.7 F (36.5 C), Max:98.2 F (36.8 C)  Plan for trach/PEG and SNF.  Medications reviewed and include: rena-vit, miralax Labs reviewed: sodium elevated (152) CRRT for even to negative balance  Diet Order:    NPO  Skin:  Reviewed, no issues  Last BM:  1/10  Height:   Ht Readings from Last 1 Encounters:  03/31/15 '6\' 2"'  (1.88 m)   Weight:   Wt Readings from Last 1 Encounters:  04/07/15 293 lb 14 oz (133.3 kg)  Weight stable  Ideal Body Weight:  86.36 kg  BMI:  Body mass index is 37.72 kg/(m^2).  Estimated Nutritional Needs:   Kcal:  8403-7543  Protein:  >172 grams  Fluid:  1.2 L/day  EDUCATION NEEDS:   No education needs identified at this time  Newborn, Jay, [place] Pager [phone] After Hours Pager

## 2015-04-08 ENCOUNTER — Encounter: Payer: Self-pay | Admitting: Vascular Surgery

## 2015-04-08 ENCOUNTER — Inpatient Hospital Stay (HOSPITAL_COMMUNITY): Payer: Medicare Other

## 2015-04-08 LAB — CBC
HCT: 35.4 % — ABNORMAL LOW (ref 39.0–52.0)
Hemoglobin: 10.3 g/dL — ABNORMAL LOW (ref 13.0–17.0)
MCH: 29.1 pg (ref 26.0–34.0)
MCHC: 29.1 g/dL — ABNORMAL LOW (ref 30.0–36.0)
MCV: 100 fL (ref 78.0–100.0)
PLATELETS: 86 10*3/uL — AB (ref 150–400)
RBC: 3.54 MIL/uL — AB (ref 4.22–5.81)
RDW: 19.9 % — ABNORMAL HIGH (ref 11.5–15.5)
WBC: 10.7 10*3/uL — ABNORMAL HIGH (ref 4.0–10.5)

## 2015-04-08 LAB — POCT I-STAT, CHEM 8
BUN: 26 mg/dL — ABNORMAL HIGH (ref 6–20)
BUN: 27 mg/dL — ABNORMAL HIGH (ref 6–20)
BUN: 27 mg/dL — ABNORMAL HIGH (ref 6–20)
BUN: 28 mg/dL — AB (ref 6–20)
BUN: 29 mg/dL — ABNORMAL HIGH (ref 6–20)
BUN: 31 mg/dL — ABNORMAL HIGH (ref 6–20)
BUN: 40 mg/dL — AB (ref 6–20)
BUN: 40 mg/dL — AB (ref 6–20)
BUN: 41 mg/dL — AB (ref 6–20)
BUN: 49 mg/dL — ABNORMAL HIGH (ref 6–20)
CALCIUM ION: 1.1 mmol/L — AB (ref 1.12–1.23)
CALCIUM ION: 1.13 mmol/L (ref 1.12–1.23)
CHLORIDE: 113 mmol/L — AB (ref 101–111)
CHLORIDE: 119 mmol/L — AB (ref 101–111)
CHLORIDE: 120 mmol/L — AB (ref 101–111)
CHLORIDE: 122 mmol/L — AB (ref 101–111)
CREATININE: 3 mg/dL — AB (ref 0.61–1.24)
CREATININE: 3.2 mg/dL — AB (ref 0.61–1.24)
CREATININE: 3.5 mg/dL — AB (ref 0.61–1.24)
Calcium, Ion: 0.45 mmol/L — CL (ref 1.12–1.23)
Calcium, Ion: 0.42 mmol/L — CL (ref 1.12–1.23)
Calcium, Ion: 0.41 mmol/L — CL (ref 1.12–1.23)
Calcium, Ion: 0.42 mmol/L — CL (ref 1.12–1.23)
Calcium, Ion: 0.43 mmol/L — CL (ref 1.12–1.23)
Calcium, Ion: 1.14 mmol/L (ref 1.12–1.23)
Calcium, Ion: 0.4 mmol/L — CL (ref 1.12–1.23)
Calcium, Ion: 1.14 mmol/L (ref 1.12–1.23)
Chloride: 115 mmol/L — ABNORMAL HIGH (ref 101–111)
Chloride: 118 mmol/L — ABNORMAL HIGH (ref 101–111)
Chloride: 116 mmol/L — ABNORMAL HIGH (ref 101–111)
Chloride: 125 mmol/L — ABNORMAL HIGH (ref 101–111)
Chloride: 114 mmol/L — ABNORMAL HIGH (ref 101–111)
Chloride: 118 mmol/L — ABNORMAL HIGH (ref 101–111)
Creatinine, Ser: 2 mg/dL — ABNORMAL HIGH (ref 0.61–1.24)
Creatinine, Ser: 1.9 mg/dL — ABNORMAL HIGH (ref 0.61–1.24)
Creatinine, Ser: 2 mg/dL — ABNORMAL HIGH (ref 0.61–1.24)
Creatinine, Ser: 2 mg/dL — ABNORMAL HIGH (ref 0.61–1.24)
Creatinine, Ser: 2.3 mg/dL — ABNORMAL HIGH (ref 0.61–1.24)
Creatinine, Ser: 2.2 mg/dL — ABNORMAL HIGH (ref 0.61–1.24)
Creatinine, Ser: 3.5 mg/dL — ABNORMAL HIGH (ref 0.61–1.24)
GLUCOSE: 139 mg/dL — AB (ref 65–99)
GLUCOSE: 142 mg/dL — AB (ref 65–99)
Glucose, Bld: 127 mg/dL — ABNORMAL HIGH (ref 65–99)
Glucose, Bld: 139 mg/dL — ABNORMAL HIGH (ref 65–99)
Glucose, Bld: 152 mg/dL — ABNORMAL HIGH (ref 65–99)
Glucose, Bld: 148 mg/dL — ABNORMAL HIGH (ref 65–99)
Glucose, Bld: 109 mg/dL — ABNORMAL HIGH (ref 65–99)
Glucose, Bld: 131 mg/dL — ABNORMAL HIGH (ref 65–99)
Glucose, Bld: 119 mg/dL — ABNORMAL HIGH (ref 65–99)
Glucose, Bld: 142 mg/dL — ABNORMAL HIGH (ref 65–99)
HCT: 40 % (ref 39.0–52.0)
HCT: 42 % (ref 39.0–52.0)
HCT: 42 % (ref 39.0–52.0)
HCT: 37 % — ABNORMAL LOW (ref 39.0–52.0)
HCT: 34 % — ABNORMAL LOW (ref 39.0–52.0)
HCT: 38 % — ABNORMAL LOW (ref 39.0–52.0)
HEMATOCRIT: 35 % — AB (ref 39.0–52.0)
HEMATOCRIT: 36 % — AB (ref 39.0–52.0)
HEMATOCRIT: 37 % — AB (ref 39.0–52.0)
HEMATOCRIT: 41 % (ref 39.0–52.0)
HEMOGLOBIN: 11.9 g/dL — AB (ref 13.0–17.0)
HEMOGLOBIN: 12.6 g/dL — AB (ref 13.0–17.0)
HEMOGLOBIN: 13.9 g/dL (ref 13.0–17.0)
HEMOGLOBIN: 14.3 g/dL (ref 13.0–17.0)
HEMOGLOBIN: 14.3 g/dL (ref 13.0–17.0)
Hemoglobin: 12.2 g/dL — ABNORMAL LOW (ref 13.0–17.0)
Hemoglobin: 13.6 g/dL (ref 13.0–17.0)
Hemoglobin: 12.6 g/dL — ABNORMAL LOW (ref 13.0–17.0)
Hemoglobin: 11.6 g/dL — ABNORMAL LOW (ref 13.0–17.0)
Hemoglobin: 12.9 g/dL — ABNORMAL LOW (ref 13.0–17.0)
POTASSIUM: 3.2 mmol/L — AB (ref 3.5–5.1)
POTASSIUM: 3.2 mmol/L — AB (ref 3.5–5.1)
POTASSIUM: 3.3 mmol/L — AB (ref 3.5–5.1)
POTASSIUM: 3.3 mmol/L — AB (ref 3.5–5.1)
POTASSIUM: 3.4 mmol/L — AB (ref 3.5–5.1)
POTASSIUM: 3.9 mmol/L (ref 3.5–5.1)
Potassium: 3.4 mmol/L — ABNORMAL LOW (ref 3.5–5.1)
Potassium: 3 mmol/L — ABNORMAL LOW (ref 3.5–5.1)
Potassium: 3.2 mmol/L — ABNORMAL LOW (ref 3.5–5.1)
Potassium: 3.7 mmol/L (ref 3.5–5.1)
SODIUM: 145 mmol/L (ref 135–145)
SODIUM: 148 mmol/L — AB (ref 135–145)
SODIUM: 148 mmol/L — AB (ref 135–145)
SODIUM: 149 mmol/L — AB (ref 135–145)
SODIUM: 150 mmol/L — AB (ref 135–145)
SODIUM: 151 mmol/L — AB (ref 135–145)
SODIUM: 151 mmol/L — AB (ref 135–145)
Sodium: 146 mmol/L — ABNORMAL HIGH (ref 135–145)
Sodium: 145 mmol/L (ref 135–145)
Sodium: 150 mmol/L — ABNORMAL HIGH (ref 135–145)
TCO2: 11 mmol/L (ref 0–100)
TCO2: 12 mmol/L (ref 0–100)
TCO2: 12 mmol/L (ref 0–100)
TCO2: 13 mmol/L (ref 0–100)
TCO2: 13 mmol/L (ref 0–100)
TCO2: 14 mmol/L (ref 0–100)
TCO2: 17 mmol/L (ref 0–100)
TCO2: 17 mmol/L (ref 0–100)
TCO2: 18 mmol/L (ref 0–100)
TCO2: 20 mmol/L (ref 0–100)

## 2015-04-08 LAB — RENAL FUNCTION PANEL
ALBUMIN: 3.1 g/dL — AB (ref 3.5–5.0)
ALBUMIN: 3.1 g/dL — AB (ref 3.5–5.0)
ANION GAP: 14 (ref 5–15)
ANION GAP: 12 (ref 5–15)
BUN: 38 mg/dL — ABNORMAL HIGH (ref 6–20)
BUN: 41 mg/dL — ABNORMAL HIGH (ref 6–20)
CALCIUM: 9.1 mg/dL (ref 8.9–10.3)
CO2: 19 mmol/L — ABNORMAL LOW (ref 22–32)
CO2: 17 mmol/L — ABNORMAL LOW (ref 22–32)
Calcium: 8.7 mg/dL — ABNORMAL LOW (ref 8.9–10.3)
Chloride: 114 mmol/L — ABNORMAL HIGH (ref 101–111)
Chloride: 114 mmol/L — ABNORMAL HIGH (ref 101–111)
Creatinine, Ser: 3.31 mg/dL — ABNORMAL HIGH (ref 0.61–1.24)
Creatinine, Ser: 3.61 mg/dL — ABNORMAL HIGH (ref 0.61–1.24)
GFR calc non Af Amer: 19 mL/min — ABNORMAL LOW (ref >60)
GFR, EST AFRICAN AMERICAN: 23 mL/min — AB (ref >60)
GFR, EST AFRICAN AMERICAN: 20 mL/min — AB (ref >60)
GFR, EST NON AFRICAN AMERICAN: 18 mL/min — AB (ref >60)
GLUCOSE: 115 mg/dL — AB (ref 65–99)
GLUCOSE: 102 mg/dL — AB (ref 65–99)
PHOSPHORUS: 3.5 mg/dL (ref 2.5–4.6)
PHOSPHORUS: 3.4 mg/dL (ref 2.5–4.6)
POTASSIUM: 3.4 mmol/L — AB (ref 3.5–5.1)
Potassium: 3.6 mmol/L (ref 3.5–5.1)
SODIUM: 147 mmol/L — AB (ref 135–145)
Sodium: 143 mmol/L (ref 135–145)

## 2015-04-08 LAB — GLUCOSE, CAPILLARY
GLUCOSE-CAPILLARY: 117 mg/dL — AB (ref 65–99)
GLUCOSE-CAPILLARY: 132 mg/dL — AB (ref 65–99)
GLUCOSE-CAPILLARY: 90 mg/dL (ref 65–99)
Glucose-Capillary: 96 mg/dL (ref 65–99)
Glucose-Capillary: 135 mg/dL — ABNORMAL HIGH (ref 65–99)
Glucose-Capillary: 110 mg/dL — ABNORMAL HIGH (ref 65–99)

## 2015-04-08 LAB — MAGNESIUM: Magnesium: 1.6 mg/dL — ABNORMAL LOW (ref 1.7–2.4)

## 2015-04-08 LAB — CALCIUM, IONIZED: CALCIUM, IONIZED, SERUM: 4.4 mg/dL — AB (ref 4.5–5.6)

## 2015-04-08 MED ORDER — ETOMIDATE 2 MG/ML IV SOLN
20.0000 mg | Freq: Once | INTRAVENOUS | Status: AC
  Administered 2015-04-08: 20 mg via INTRAVENOUS

## 2015-04-08 MED ORDER — FREE WATER
300.0000 mL | Freq: Four times a day (QID) | Status: DC
  Administered 2015-04-08 – 2015-04-09 (×5): 300 mL

## 2015-04-08 MED ORDER — ROCURONIUM BROMIDE 50 MG/5ML IV SOLN
50.0000 mg | Freq: Once | INTRAVENOUS | Status: AC
  Administered 2015-04-08: 50 mg via INTRAVENOUS

## 2015-04-08 MED ORDER — ALTEPLASE 100 MG IV SOLR
2.0000 mg | Freq: Once | INTRAVENOUS | Status: AC
  Administered 2015-04-08: 2 mg
  Filled 2015-04-08: qty 2

## 2015-04-08 MED ORDER — SODIUM CHLORIDE 0.9 % IV SOLN
1.0000 mg/h | INTRAVENOUS | Status: DC
  Administered 2015-04-08: 8 mg/h via INTRAVENOUS
  Administered 2015-04-08 – 2015-04-11 (×5): 10 mg/h via INTRAVENOUS
  Administered 2015-04-11: 8 mg/h via INTRAVENOUS
  Filled 2015-04-08 (×8): qty 20

## 2015-04-08 NOTE — Progress Notes (Addendum)
ETT replaced by ICU MD due to cuff leak.

## 2015-04-08 NOTE — Progress Notes (Signed)
Assessment: 1 A/C resp failure - no pulm edema by CXR/ CT. Upper airway issues, OHS +/- bibasilar PNA vs atx by CT. On vent, per CCM. For trach on Friday 2 ESRD-Met Acidosis and Hypernatremia---normally TTS at [facility] via [facility]- on CRRT now. Increase free water with hypotonic bicarb (Outpt Dialysis Orders: [facility] 4.5h 135g 2/3.5 bath R IJ cath Hep 4200) Systemic PTT was too high (140) so heparin was stopped and now on citrate  3 Shock - on pressors 5 Anemia - on Aranesp. Will hold. 6 Bivent HF LVEF 30-35%, RV dysfxn w pulm htn 7 Depression - current meds 8 Left antecubital wound - seen by VVS 12/15 - has intermittently been on Vanc with wet to dry dressings  Subjective: Interval History: not weaning  Objective: Vital signs in last 24 hours: Temp:  [97.6 F (36.4 C)-98.2 F (36.8 C)] 98 F (36.7 C) (01/18 0800) Pulse Rate:  [29-99] 99 (01/18 1030) Resp:  [16-30] 30 (01/18 0833) BP: (80-148)/(45-111) 106/54 mmHg (01/18 1030) SpO2:  [64 %-100 %] 100 % (01/18 1030) FiO2 (%):  [50 %-70 %] 50 % (01/18 0833) Weight:  [133.4 kg (294 lb 1.5 oz)] 133.4 kg (294 lb 1.5 oz) (01/18 0500) Weight change: 0.1 kg (3.5 oz)  Intake/Output from previous day: 01/17 0701 - 01/18 0700 In: 5113.7 [I.V.:4553.7; NG/GT:560] Out: 5082  Intake/Output this shift: Total I/O In: 925.6 [I.V.:765.6; NG/GT:160] Out: 1097 [Other:1097]  General appearance: alert Throat: intubated orally Extremities: extremities normal, atraumatic, no cyanosis or edema  Lab Results:  Recent Labs  04/07/15 0430  04/08/15 0415  04/08/15 0751 04/08/15 1007  WBC 10.3  --  10.7*  --   --   --   HGB 9.8*  < > 10.3*  < > 14.3 14.3  HCT 35.1*  < > 35.4*  < > 42.0 42.0  PLT 125*  --  86*  --   --   --   < > = values in this interval not displayed. BMET:  Recent Labs  04/07/15 1615  04/08/15 0415  04/08/15 0751 04/08/15 1007  NA 146*  < > 147*  < > 150* 149*  K 3.6  < > 3.6  < > 3.3* 3.2*  CL 116*  < > 114*  <  > 120* 122*  CO2 17*  --  19*  --   --   --   GLUCOSE 127*  < > 115*  < > 139* 152*  BUN 37*  < > 38*  < > 27* 26*  CREATININE 3.46*  < > 3.31*  < > 1.90* 2.00*  CALCIUM 9.0  --  9.1  --   --   --   < > = values in this interval not displayed. No results for input(s): PTH in the last 72 hours. Iron Studies: No results for input(s): IRON, TIBC, TRANSFERRIN, FERRITIN in the last 72 hours. Studies/Results: Dg Chest Port 1 View  04/08/2015  CLINICAL DATA:  Respiratory failure. EXAM: PORTABLE CHEST 1 VIEW COMPARISON:  04/07/2015 . FINDINGS: Endotracheal tube, NG tube, dual-lumen right IJ catheter, left IJ line stable position. Cardiomegaly. Progressive bibasilar pulmonary atelectasis and/or infiltrates again noted. No prominent pleural effusion. No pneumothorax. IMPRESSION: 1. Lines and tubes in stable position. 2. Progressive bibasilar atelectasis and/or infiltrates. 3. Stable cardiomegaly. Electronically Signed   By: [place]   On: 04/08/2015 07:27   Dg Chest Port 1 View  04/07/2015  CLINICAL DATA:  Hypoxia EXAM: PORTABLE CHEST 1 VIEW COMPARISON:  Study obtained earlier in the day FINDINGS: Endotracheal tube tip is 6.7 cm above the carina. The left jugular catheter tip is in the left innominate vein. The right subclavian catheter tip is in the superior vena cava near the cavoatrial junction. No pneumothorax. Nasogastric tube tip and side port are below the diaphragm. There is atelectasis in the lung bases, more on the left than on the right, stable. Heart is prominent with pulmonary vascularity normal, stable. No adenopathy. IMPRESSION: Left jugular catheter tip is currently in the left innominate vein. Other tubes and catheters as described. No pneumothorax. Persistent atelectasis in lung bases, more on the left on the right, stable. No change in cardiac silhouette. Electronically Signed   By: William  Woodruff III M.D.   On: 04/07/2015 19:11   Dg Chest Port 1 View  04/07/2015  CLINICAL DATA:   Endotracheal tube EXAM: PORTABLE CHEST 1 VIEW COMPARISON:  Yesterday FINDINGS: Low volumes with bibasilar atelectasis based on chest CT 04/02/2015. Opacity has increased, especially on the right where there is flat morphology. Marked cardiopericardial enlargement without pericardial effusion on comparison CT. Endotracheal tube in good position with tip at the clavicular heads. The orogastric tube continues to the stomach at least. Bilateral IJ catheters, tips in stable unremarkable position. No effusion or pneumothorax. Pulmonary venous congestion. IMPRESSION: 1. Stable positioning of tubes and lines. 2. Worsening bibasilar opacity, atelectasis based on recent chest CT. 3. Low volumes and pulmonary venous congestion. Electronically Signed   By: Jonathon  Watts M.D.   On: 04/07/2015 07:46   Scheduled: . amitriptyline  25 mg Oral QHS  . antiseptic oral rinse  7 mL Mouth Rinse 10 times per day  . chlorhexidine gluconate  15 mL Mouth Rinse BID  . doxercalciferol  3 mcg Intravenous Q T,Th,Sat-1800  . feeding supplement (NEPRO CARB STEADY)  1,000 mL Per Tube Q24H  . feeding supplement (PRO-STAT SUGAR FREE 64)  60 mL Per Tube 5 X Daily  . free water  300 mL Per Tube Q6H  . hydrocortisone sod succinate (SOLU-CORTEF) inj  50 mg Intravenous Q12H  . multivitamin  1 tablet Oral QHS  . pantoprazole sodium  40 mg Per Tube QHS  . polyethylene glycol  17 g Oral Daily  . QUEtiapine  25 mg Per Tube Daily      LOS: 10 days   , C 04/08/2015,11:05 AM   

## 2015-04-08 NOTE — Procedures (Signed)
Intubation Procedure Note Gary Rivera [id number] 25-Sep-1960  Procedure: Intubation Indications: ETT needed to be changed  Procedure Details Consent: Unable to obtain consent because of emergent medical necessity. Time Out: Verified patient identification, verified procedure, site/side was marked, verified correct patient position, special equipment/implants available, medications/allergies/relevent history reviewed, required imaging and test results available.  Performed  Maximum sterile technique was used including gloves, hand hygiene and mask.  MAC    Evaluation Hemodynamic Status: BP stable throughout; O2 sats: stable throughout Patient's Current Condition: stable Complications: No apparent complications Patient did tolerate procedure well. Chest X-ray ordered to verify placement.  CXR: pending.   Gary Rivera 04/08/2015

## 2015-04-08 NOTE — Progress Notes (Signed)
CRRT off from 1300-1700 due to clot formation in the filter and venous return line (blue port). TPA given and removed by IV team RN.

## 2015-04-08 NOTE — Progress Notes (Signed)
PULMONARY / CRITICAL CARE MEDICINE   Name: Gary Rivera MRN: [id number] DOB: April 12, 1960    ADMISSION DATE:  03/29/2015   REFERRING MD:  ED  CHIEF COMPLAINT:  Resp failure  SUBJECTIVE:   No significant overnight events. Seen by ENT yesterday, scheduled for trach in AM of 1/20. Cuff leak with balloon max inflated this AM per RT.   VITAL SIGNS: BP 83/52 mmHg  Pulse 80  Temp(Src) 97.7 F (36.5 C) (Oral)  Resp 30  Ht 6\' 2"  (1.88 m)  Wt 294 lb 1.5 oz (133.4 kg)  BMI 37.74 kg/m2  SpO2 100%  HEMODYNAMICS: CVP:  [11 mmHg-23 mmHg] 17 mmHg  VENTILATOR SETTINGS: Vent Mode:  [-] PRVC FiO2 (%):  [60 %-70 %] 60 % Set Rate:  [30 bmp] 30 bmp Vt Set:  [600 mL] 600 mL PEEP:  [5 cmH20] 5 cmH20 Plateau Pressure:  [15 cmH20-36 cmH20] 30 cmH20  INTAKE / OUTPUT: I/O last 3 completed shifts: In: 4819.7 [I.V.:3844.7; NG/GT:975] Out: 4703 [Other:4703]  PHYSICAL EXAMINATION: General: Acutely ill appearing male, intubated, lying in bed.  Neuro: RASS -1, moves all extremities spontaneously HEENT:  PERRL, moist mucus membranes. ETT in place. RIJ tunneled cath, LIJ CVL.  Cardiovascular:  RRR, no murmurs, gallops, or rubs.  Lungs: Air entry equal bilaterally, coarse, no rales or wheezes.  Abdomen: obese, soft, NT, ND and +BS. Musculoskeletal:  Intact. Skin: Intact, left antecubital wound noted  LABS:  BMET  Recent Labs Lab 04/07/15 0430  04/07/15 1615  04/08/15 0415 04/08/15 0607 04/08/15 0611  NA 145  < > 146*  < > 147* 151* 148*  K 4.2  < > 3.6  < > 3.6 3.2* 3.4*  CL 114*  < > 116*  < > 114* 119* 113*  CO2 20*  --  17*  --  19*  --   --   BUN 44*  < > 37*  < > 38* 28* 40*  CREATININE 3.82*  < > 3.46*  < > 3.31* 2.00* 3.00*  GLUCOSE 110*  < > 127*  < > 115* 139* 127*  < > = values in this interval not displayed. Electrolytes  Recent Labs Lab 04/06/15 0603  04/07/15 0430 04/07/15 1615 04/08/15 0415  CALCIUM 8.7*  < > 8.9 9.0 9.1  MG 2.4  --  2.1  --  1.6*  PHOS 2.6   < > 3.9 2.8 3.5  < > = values in this interval not displayed. CBC  Recent Labs Lab 04/05/15 0426  04/07/15 0430  04/08/15 0415 04/08/15 0607 04/08/15 0611  WBC 8.4  --  10.3  --  10.7*  --   --   HGB 9.9*  < > 9.8*  < > 10.3* 13.6 12.2*  HCT 32.4*  < > 35.1*  < > 35.4* 40.0 36.0*  PLT 137*  --  125*  --  86*  --   --   < > = values in this interval not displayed. Coag's  Recent Labs Lab 04/03/15 0440 04/04/15 0401 04/05/15 0426  APTT 42* 35 140*   Sepsis Markers  Recent Labs Lab 04/01/15 0930 04/03/15 0440 04/05/15 0426  PROCALCITON 2.57 1.77 1.02   ABG  Recent Labs Lab 04/02/15 0341 04/07/15 0514 04/07/15 0615  PHART 7.412 7.158* 7.199*  PCO2ART 41.6 46.7* 27.2*  PO2ART 64.0* 63.0* 137*   Liver Enzymes  Recent Labs Lab 04/07/15 0430 04/07/15 1615 04/08/15 0415  ALBUMIN 3.1* 3.3* 3.1*    Glucose  Recent Labs Lab  04/07/15 0805 04/07/15 1232 04/07/15 1620 04/07/15 1936 04/08/15 0006 04/08/15 0415  GLUCAP 89 72 105* 121* 132* 96    Imaging Dg Chest Port 1 View  04/07/2015  CLINICAL DATA:  Hypoxia EXAM: PORTABLE CHEST 1 VIEW COMPARISON:  Study obtained earlier in the day FINDINGS: Endotracheal tube tip is 6.7 cm above the carina. The left jugular catheter tip is in the left innominate vein. The right subclavian catheter tip is in the superior vena cava near the cavoatrial junction. No pneumothorax. Nasogastric tube tip and side port are below the diaphragm. There is atelectasis in the lung bases, more on the left than on the right, stable. Heart is prominent with pulmonary vascularity normal, stable. No adenopathy. IMPRESSION: Left jugular catheter tip is currently in the left innominate vein. Other tubes and catheters as described. No pneumothorax. Persistent atelectasis in lung bases, more on the left on the right, stable. No change in cardiac silhouette. Electronically Signed   By: Bretta Bang III M.D.   On: 04/07/2015 19:11     STUDIES:   CT chest 1/12 >> Bibasilar atelectasis CT neck 1/12 >> Artifact with movement, basilar consolidation with air bronchograms, no mass  CULTURES: 1/8 Blood x 2 >> neg 1/8 Urine >> not done 1/12 Sputum >> Oral flora 1/11 RVP >> neg  ANTIBIOTICS: 1/8 vanc >> 1/12 1/8 zoysn >> 1/12  SIGNIFICANT EVENTS: 1/6 Discharge from [facility] following intubation 1/8 Admitted, intubated 1/9 Extubated 1/11 Re-intubated 1/13  Hypothermia on cvvhd  LINES/TUBES: 1/8 ETT>>01/09>>>1/11>>> LIJ CVL 1/11 >>   DISCUSSION: 55 yo MO AAF non compliant with HD and CPAP and re-presents with hypercarbic and hypoxic resp failure  ASSESSMENT / PLAN:  PULMONARY A: VDRF/Hypercarbic respiratory failure OSA/OHS  Very DIFFICULT AIRWAY required bronchoscopy for intubation by PCCM Bibasilar atelectasis P:   Full vent support Wean PEEP / FiO2 for sats > 92% Daily SBT Trach scheduled for 1/20 with ENT Albuterol prn for wheezing Upright positioning as tolerated   CARDIOVASCULAR A:  HTN Pulmonary HTN MVR Continued Hypotension, most likely 2/2 sedation Chronic Combined CHF P:  CVP 17 this AM Neo gtt for MAP < 65 Hold antihypertensives NEGATIVE balance today Stress dose steroids; hydrocortisone to 50 q12h  RENAL A:   ESRD on HD, non-compliant, RIJ tunneled cath Non-Anion Gap Metabolic Acidosis Hypomagnesemia Hypernatremia P:   CRRT per renal, even/negative balance Bicarb per renal Start free water per tube 300 mL q6h Follow BMP Electrolytes replaced via CRRT  GASTROINTESTINAL A:   GI PPx P:   Protonix Tube Feeds as tolerated   HEMATOLOGIC A:   Anemia of chronic illness Thrombocytopenia; chronic but acutely worsened; 86 this AM DVT PPx P:  Repeat CBC in AM SCD's No heparin  INFECTIOUS A:   Left antecubital wound  Dependent Airspace Disease  P:   Off abx since 1/12 Monitor CBC, fever curve Follow cultures  ENDOCRINE A:   No acute issues P:   Glucose on  BMP  NEUROLOGIC A:   Metabolic Encephalopathy 2/2 hypercarbia Agitation; improved P:   RASS goal: 0, -1 Continue home amitriptyline  Fentanyl gtt, attempt to turn off Versed gtt Continue Seroquel 25 mg daily Haldol prn Daily WUA    FAMILY  - Updates: Family and patient updated at bedside 1/17, okay to proceed with trach and PEG placement  Lauris Chroman, MD PGY-3, Internal Medicine Pager: [phone]  Attending Note:  55 year old morbidly obese male with ESRD who is intubated for hypercarbic respiratory failure likely related OSA  and PNA.  Spoke with patient and family, updated bedside.  On exam, patient's lungs have diffuse crackles.  Remains on neo at 100 mcg when on sedation.  Without sedation at 20 mcg of neo.  Trach on the 20th via ENT then will need a PEG next week at one point.  Once trached, will be able to d/c sedation and hopefully come off pressors and then be able to use HD.  Will consult with case management for placement.  In the meantime, continue CRRT negative to even.  ETT leaking.  Will need to change ETT.  Sister updated bedside.  The patient is critically ill with multiple organ systems failure and requires high complexity decision making for assessment and support, frequent evaluation and titration of therapies, application of advanced monitoring technologies and extensive interpretation of multiple databases.   Critical Care Time devoted to patient care services described in this note is  35  Minutes. This time reflects time of care of this signee Dr Koren Bound. This critical care time does not reflect procedure time, or teaching time or supervisory time of PA/NP/Med student/Med Resident etc but could involve care discussion time.  Alyson Reedy, M.D. [facility] Pulmonary/Critical Care Medicine. Pager: [phone]. After hours pager: [phone].

## 2015-04-09 ENCOUNTER — Ambulatory Visit: Payer: Self-pay | Admitting: Otolaryngology

## 2015-04-09 DIAGNOSIS — R570 Cardiogenic shock: Secondary | ICD-10-CM

## 2015-04-09 LAB — POCT I-STAT, CHEM 8
BUN: 29 mg/dL — ABNORMAL HIGH (ref 6–20)
BUN: 40 mg/dL — ABNORMAL HIGH (ref 6–20)
BUN: 27 mg/dL — AB (ref 6–20)
BUN: 55 mg/dL — ABNORMAL HIGH (ref 6–20)
BUN: 27 mg/dL — ABNORMAL HIGH (ref 6–20)
BUN: 59 mg/dL — AB (ref 6–20)
BUN: 30 mg/dL — AB (ref 6–20)
BUN: 53 mg/dL — AB (ref 6–20)
BUN: 30 mg/dL — ABNORMAL HIGH (ref 6–20)
BUN: 56 mg/dL — ABNORMAL HIGH (ref 6–20)
BUN: 29 mg/dL — ABNORMAL HIGH (ref 6–20)
CALCIUM ION: 0.4 mmol/L — AB (ref 1.12–1.23)
CALCIUM ION: 1.07 mmol/L — AB (ref 1.12–1.23)
CALCIUM ION: 0.43 mmol/L — AB (ref 1.12–1.23)
CALCIUM ION: 0.51 mmol/L — AB (ref 1.12–1.23)
CALCIUM ION: 1.11 mmol/L — AB (ref 1.12–1.23)
CALCIUM ION: 0.47 mmol/L — AB (ref 1.12–1.23)
CHLORIDE: 117 mmol/L — AB (ref 101–111)
CHLORIDE: 113 mmol/L — AB (ref 101–111)
CHLORIDE: 117 mmol/L — AB (ref 101–111)
CHLORIDE: 113 mmol/L — AB (ref 101–111)
CHLORIDE: 115 mmol/L — AB (ref 101–111)
CHLORIDE: 114 mmol/L — AB (ref 101–111)
CREATININE: 2 mg/dL — AB (ref 0.61–1.24)
CREATININE: 1.9 mg/dL — AB (ref 0.61–1.24)
CREATININE: 2.6 mg/dL — AB (ref 0.61–1.24)
CREATININE: 2.6 mg/dL — AB (ref 0.61–1.24)
CREATININE: 2.1 mg/dL — AB (ref 0.61–1.24)
Calcium, Ion: 0.42 mmol/L — CL (ref 1.12–1.23)
Calcium, Ion: 0.99 mmol/L — ABNORMAL LOW (ref 1.12–1.23)
Calcium, Ion: 0.41 mmol/L — CL (ref 1.12–1.23)
Calcium, Ion: 1.05 mmol/L — ABNORMAL LOW (ref 1.12–1.23)
Calcium, Ion: 1.01 mmol/L — ABNORMAL LOW (ref 1.12–1.23)
Chloride: 118 mmol/L — ABNORMAL HIGH (ref 101–111)
Chloride: 114 mmol/L — ABNORMAL HIGH (ref 101–111)
Chloride: 119 mmol/L — ABNORMAL HIGH (ref 101–111)
Chloride: 112 mmol/L — ABNORMAL HIGH (ref 101–111)
Chloride: 115 mmol/L — ABNORMAL HIGH (ref 101–111)
Creatinine, Ser: 3.1 mg/dL — ABNORMAL HIGH (ref 0.61–1.24)
Creatinine, Ser: 1.8 mg/dL — ABNORMAL HIGH (ref 0.61–1.24)
Creatinine, Ser: 2.9 mg/dL — ABNORMAL HIGH (ref 0.61–1.24)
Creatinine, Ser: 2 mg/dL — ABNORMAL HIGH (ref 0.61–1.24)
Creatinine, Ser: 2.2 mg/dL — ABNORMAL HIGH (ref 0.61–1.24)
Creatinine, Ser: 2.8 mg/dL — ABNORMAL HIGH (ref 0.61–1.24)
GLUCOSE: 139 mg/dL — AB (ref 65–99)
GLUCOSE: 112 mg/dL — AB (ref 65–99)
GLUCOSE: 114 mg/dL — AB (ref 65–99)
GLUCOSE: 141 mg/dL — AB (ref 65–99)
GLUCOSE: 154 mg/dL — AB (ref 65–99)
Glucose, Bld: 150 mg/dL — ABNORMAL HIGH (ref 65–99)
Glucose, Bld: 112 mg/dL — ABNORMAL HIGH (ref 65–99)
Glucose, Bld: 145 mg/dL — ABNORMAL HIGH (ref 65–99)
Glucose, Bld: 97 mg/dL (ref 65–99)
Glucose, Bld: 121 mg/dL — ABNORMAL HIGH (ref 65–99)
Glucose, Bld: 147 mg/dL — ABNORMAL HIGH (ref 65–99)
HCT: 37 % — ABNORMAL LOW (ref 39.0–52.0)
HCT: 35 % — ABNORMAL LOW (ref 39.0–52.0)
HCT: 32 % — ABNORMAL LOW (ref 39.0–52.0)
HCT: 37 % — ABNORMAL LOW (ref 39.0–52.0)
HCT: 37 % — ABNORMAL LOW (ref 39.0–52.0)
HEMATOCRIT: 28 % — AB (ref 39.0–52.0)
HEMATOCRIT: 38 % — AB (ref 39.0–52.0)
HEMATOCRIT: 38 % — AB (ref 39.0–52.0)
HEMATOCRIT: 31 % — AB (ref 39.0–52.0)
HEMATOCRIT: 34 % — AB (ref 39.0–52.0)
HEMATOCRIT: 36 % — AB (ref 39.0–52.0)
HEMOGLOBIN: 12.6 g/dL — AB (ref 13.0–17.0)
HEMOGLOBIN: 12.9 g/dL — AB (ref 13.0–17.0)
HEMOGLOBIN: 9.5 g/dL — AB (ref 13.0–17.0)
HEMOGLOBIN: 12.6 g/dL — AB (ref 13.0–17.0)
HEMOGLOBIN: 11.6 g/dL — AB (ref 13.0–17.0)
HEMOGLOBIN: 12.6 g/dL — AB (ref 13.0–17.0)
Hemoglobin: 11.9 g/dL — ABNORMAL LOW (ref 13.0–17.0)
Hemoglobin: 10.9 g/dL — ABNORMAL LOW (ref 13.0–17.0)
Hemoglobin: 12.9 g/dL — ABNORMAL LOW (ref 13.0–17.0)
Hemoglobin: 10.5 g/dL — ABNORMAL LOW (ref 13.0–17.0)
Hemoglobin: 12.2 g/dL — ABNORMAL LOW (ref 13.0–17.0)
POTASSIUM: 3.3 mmol/L — AB (ref 3.5–5.1)
POTASSIUM: 5.2 mmol/L — AB (ref 3.5–5.1)
POTASSIUM: 3.1 mmol/L — AB (ref 3.5–5.1)
POTASSIUM: 5.1 mmol/L (ref 3.5–5.1)
POTASSIUM: 3.2 mmol/L — AB (ref 3.5–5.1)
POTASSIUM: 4.3 mmol/L (ref 3.5–5.1)
Potassium: 3.1 mmol/L — ABNORMAL LOW (ref 3.5–5.1)
Potassium: 3.7 mmol/L (ref 3.5–5.1)
Potassium: 3.2 mmol/L — ABNORMAL LOW (ref 3.5–5.1)
Potassium: 4.4 mmol/L (ref 3.5–5.1)
Potassium: 3.2 mmol/L — ABNORMAL LOW (ref 3.5–5.1)
SODIUM: 149 mmol/L — AB (ref 135–145)
SODIUM: 145 mmol/L (ref 135–145)
SODIUM: 140 mmol/L (ref 135–145)
SODIUM: 147 mmol/L — AB (ref 135–145)
SODIUM: 148 mmol/L — AB (ref 135–145)
SODIUM: 143 mmol/L (ref 135–145)
SODIUM: 141 mmol/L (ref 135–145)
SODIUM: 146 mmol/L — AB (ref 135–145)
SODIUM: 146 mmol/L — AB (ref 135–145)
Sodium: 135 mmol/L (ref 135–145)
Sodium: 147 mmol/L — ABNORMAL HIGH (ref 135–145)
TCO2: 11 mmol/L (ref 0–100)
TCO2: 17 mmol/L (ref 0–100)
TCO2: 11 mmol/L (ref 0–100)
TCO2: 16 mmol/L (ref 0–100)
TCO2: 10 mmol/L (ref 0–100)
TCO2: 17 mmol/L (ref 0–100)
TCO2: 12 mmol/L (ref 0–100)
TCO2: 15 mmol/L (ref 0–100)
TCO2: 12 mmol/L (ref 0–100)
TCO2: 17 mmol/L (ref 0–100)
TCO2: 12 mmol/L (ref 0–100)

## 2015-04-09 LAB — RENAL FUNCTION PANEL
ALBUMIN: 2.9 g/dL — AB (ref 3.5–5.0)
ALBUMIN: 3 g/dL — AB (ref 3.5–5.0)
ANION GAP: 11 (ref 5–15)
Anion gap: 9 (ref 5–15)
BUN: 41 mg/dL — ABNORMAL HIGH (ref 6–20)
BUN: 36 mg/dL — AB (ref 6–20)
CALCIUM: 8.2 mg/dL — AB (ref 8.9–10.3)
CALCIUM: 8.4 mg/dL — AB (ref 8.9–10.3)
CO2: 17 mmol/L — AB (ref 22–32)
CO2: 16 mmol/L — ABNORMAL LOW (ref 22–32)
CREATININE: 3.03 mg/dL — AB (ref 0.61–1.24)
Chloride: 116 mmol/L — ABNORMAL HIGH (ref 101–111)
Chloride: 115 mmol/L — ABNORMAL HIGH (ref 101–111)
Creatinine, Ser: 3.33 mg/dL — ABNORMAL HIGH (ref 0.61–1.24)
GFR calc Af Amer: 25 mL/min — ABNORMAL LOW (ref >60)
GFR calc non Af Amer: 19 mL/min — ABNORMAL LOW (ref >60)
GFR, EST AFRICAN AMERICAN: 22 mL/min — AB (ref >60)
GFR, EST NON AFRICAN AMERICAN: 22 mL/min — AB (ref >60)
GLUCOSE: 112 mg/dL — AB (ref 65–99)
Glucose, Bld: 123 mg/dL — ABNORMAL HIGH (ref 65–99)
PHOSPHORUS: 3.1 mg/dL (ref 2.5–4.6)
PHOSPHORUS: 3.1 mg/dL (ref 2.5–4.6)
POTASSIUM: 3.7 mmol/L (ref 3.5–5.1)
Potassium: 3.6 mmol/L (ref 3.5–5.1)
SODIUM: 144 mmol/L (ref 135–145)
SODIUM: 140 mmol/L (ref 135–145)

## 2015-04-09 LAB — MAGNESIUM: Magnesium: 1.5 mg/dL — ABNORMAL LOW (ref 1.7–2.4)

## 2015-04-09 LAB — GLUCOSE, CAPILLARY
GLUCOSE-CAPILLARY: 108 mg/dL — AB (ref 65–99)
GLUCOSE-CAPILLARY: 109 mg/dL — AB (ref 65–99)
GLUCOSE-CAPILLARY: 109 mg/dL — AB (ref 65–99)
GLUCOSE-CAPILLARY: 162 mg/dL — AB (ref 65–99)
Glucose-Capillary: 101 mg/dL — ABNORMAL HIGH (ref 65–99)
Glucose-Capillary: 132 mg/dL — ABNORMAL HIGH (ref 65–99)

## 2015-04-09 LAB — CALCIUM, IONIZED: CALCIUM, IONIZED, SERUM: 4.4 mg/dL — AB (ref 4.5–5.6)

## 2015-04-09 MED ORDER — SENNOSIDES 8.8 MG/5ML PO SYRP
5.0000 mL | ORAL_SOLUTION | Freq: Two times a day (BID) | ORAL | Status: DC
  Administered 2015-04-09 – 2015-04-11 (×6): 5 mL via ORAL
  Filled 2015-04-09 (×6): qty 5

## 2015-04-09 MED ORDER — FREE WATER
300.0000 mL | Freq: Four times a day (QID) | Status: DC
  Administered 2015-04-09 – 2015-04-13 (×14): 300 mL

## 2015-04-09 MED ORDER — MAGNESIUM SULFATE 2 GM/50ML IV SOLN
2.0000 g | Freq: Once | INTRAVENOUS | Status: AC
  Administered 2015-04-09: 2 g via INTRAVENOUS
  Filled 2015-04-09: qty 50

## 2015-04-09 NOTE — Progress Notes (Signed)
Met w sister again to discuss dc planning and will cont to follow for needs as pt progresses. Explained difference in [facility] vs snf. Explained that snf that provide trach-dialysis care could even be in different state. Sister understood and just wants her brother to improve. Pt and sister have been very close. Pt only has sister and 1 niece. Will cont to follow.

## 2015-04-09 NOTE — Progress Notes (Signed)
Assessment: 1 A/C resp failure - no pulm edema by CXR/ CT. Upper airway issues, OHS +/- bibasilar PNA vs atx by CT. On vent, per CCM. For trach on Friday 2 ESRD-Met Acidosis and Hypernatremia---normally TTS at [facility]- on CRRT now. Receiving free water with hypotonic bicarb.  Will keep even (Outpt Dialysis Orders: [facility] 4.5h 135g 2/3.5 bath R IJ cath Hep 4200) Systemic PTT was too high (140) so heparin was stopped and now on citrate  3 Shock - on pressors 5 Anemia - on Aranesp. Will hold. 6 Bivent HF LVEF 30-35%, RV dysfxn w pulm htn 7 Depression - current meds 8 Left antecubital wound - seen by [facility] 12/15 - has intermittently been on Vanc with wet to dry dressings  Subjective: Interval History: oxygen requirements down  Objective: Vital signs in last 24 hours: Temp:  [97.5 F (36.4 C)-98.5 F (36.9 C)] 97.5 F (36.4 C) (01/19 0800) Pulse Rate:  [68-106] 82 (01/19 1200) Resp:  [24-31] 30 (01/19 1200) BP: (70-178)/(46-147) 125/68 mmHg (01/19 1200) SpO2:  [74 %-100 %] 100 % (01/19 1200) FiO2 (%):  [40 %-50 %] 40 % (01/19 1110) Weight:  [133.1 kg (293 lb 6.9 oz)] 133.1 kg (293 lb 6.9 oz) (01/19 0412) Weight change: -0.3 kg (-10.6 oz)  Intake/Output from previous day: 01/18 0701 - 01/19 0700 In: 5918.1 [I.V.:4758.1; NG/GT:1160] Out: 5089  Intake/Output this shift: Total I/O In: 1724.3 [I.V.:1029.3; NG/GT:645; IV Piggyback:50] Out: 1610 [Other:1385]  General appearance: alert and cooperative Extremities: edema no edema  Using PC  Lab Results:  Recent Labs  04/07/15 0430  04/08/15 0415  04/09/15 0428 04/09/15 0432  WBC 10.3  --  10.7*  --   --   --   HGB 9.8*  < > 10.3*  < > 12.6* 11.9*  HCT 35.1*  < > 35.4*  < > 37.0* 35.0*  PLT 125*  --  86*  --   --   --   < > = values in this interval not displayed. BMET:  Recent Labs  04/08/15 1606  04/09/15 0431 04/09/15 0432  NA 143  < > 144 145  K 3.4*  < > 3.6 3.7  CL 114*  < > 116* 114*  CO2 17*   --  17*  --   GLUCOSE 102*  < > 123* 112*  BUN 41*  < > 41* 40*  CREATININE 3.61*  < > 3.33* 3.10*  CALCIUM 8.7*  --  8.2*  --   < > = values in this interval not displayed. No results for input(s): PTH in the last 72 hours. Iron Studies: No results for input(s): IRON, TIBC, TRANSFERRIN, FERRITIN in the last 72 hours. Studies/Results: Dg Chest Port 1 View  04/08/2015  CLINICAL DATA:  Intubated, CHF EXAM: PORTABLE CHEST 1 VIEW COMPARISON:  Chest radiograph from earlier today. FINDINGS: Endotracheal tube tip is 3.2 cm above the carina. Enteric tube enters the stomach, with the tip not seen on this image. Right internal jugular central venous catheter terminates in the lower third of the superior vena cava. Left internal jugular central venous catheter terminates in the upper third of the superior vena cava. Stable cardiomediastinal silhouette with mild cardiomegaly. No pneumothorax. No pleural effusion. Low lung volumes. Worsening mild pulmonary edema. Patchy consolidation at both lung bases, left greater than right, not appreciably changed. IMPRESSION: 1. Well-positioned lines and tubes. 2. Worsening mild congestive heart failure. 3. Low lung volumes. Patchy bibasilar lung consolidation, left greater than right, favor  atelectasis, cannot exclude a component of pneumonia. Electronically Signed   By: Ilona Sorrel M.D.   On: 04/08/2015 15:55   Dg Chest Port 1 View  04/08/2015  CLINICAL DATA:  Respiratory failure. EXAM: PORTABLE CHEST 1 VIEW COMPARISON:  04/07/2015 . FINDINGS: Endotracheal tube, NG tube, dual-lumen right IJ catheter, left IJ line stable position. Cardiomegaly. Progressive bibasilar pulmonary atelectasis and/or infiltrates again noted. No prominent pleural effusion. No pneumothorax. IMPRESSION: 1. Lines and tubes in stable position. 2. Progressive bibasilar atelectasis and/or infiltrates. 3. Stable cardiomegaly. Electronically Signed   By: Marcello Moores  Register   On: 04/08/2015 07:27   Dg Chest  Port 1 View  04/07/2015  CLINICAL DATA:  Hypoxia EXAM: PORTABLE CHEST 1 VIEW COMPARISON:  Study obtained earlier in the day FINDINGS: Endotracheal tube tip is 6.7 cm above the carina. The left jugular catheter tip is in the left innominate vein. The right subclavian catheter tip is in the superior vena cava near the cavoatrial junction. No pneumothorax. Nasogastric tube tip and side port are below the diaphragm. There is atelectasis in the lung bases, more on the left than on the right, stable. Heart is prominent with pulmonary vascularity normal, stable. No adenopathy. IMPRESSION: Left jugular catheter tip is currently in the left innominate vein. Other tubes and catheters as described. No pneumothorax. Persistent atelectasis in lung bases, more on the left on the right, stable. No change in cardiac silhouette. Electronically Signed   By: Lowella Grip III M.D.   On: 04/07/2015 19:11   Scheduled: . amitriptyline  25 mg Oral QHS  . antiseptic oral rinse  7 mL Mouth Rinse 10 times per day  . chlorhexidine gluconate  15 mL Mouth Rinse BID  . doxercalciferol  3 mcg Intravenous Q T,Th,Sat-1800  . feeding supplement (NEPRO CARB STEADY)  1,000 mL Per Tube Q24H  . feeding supplement (PRO-STAT SUGAR FREE 64)  60 mL Per Tube 5 X Daily  . free water  300 mL Per Tube Q6H  . hydrocortisone sod succinate (SOLU-CORTEF) inj  50 mg Intravenous Q12H  . multivitamin  1 tablet Oral QHS  . pantoprazole sodium  40 mg Per Tube QHS  . polyethylene glycol  17 g Oral Daily  . QUEtiapine  25 mg Per Tube Daily  . sennosides  5 mL Oral BID     LOS: 11 days   Rahman Ferrall C 04/09/2015,12:11 PM

## 2015-04-09 NOTE — Progress Notes (Signed)
PULMONARY / CRITICAL CARE MEDICINE   Name: Gary Rivera MRN: [id number] DOB: 1960-07-18    ADMISSION DATE:  03/29/2015   REFERRING MD:  ED  CHIEF COMPLAINT:  Resp failure  SUBJECTIVE:   No overnight events. ETT changed yesterday d/t cuff leak.   VITAL SIGNS: BP 108/71 mmHg  Pulse 74  Temp(Src) 98.4 F (36.9 C) (Axillary)  Resp 29  Ht 6\' 2"  (1.88 m)  Wt 293 lb 6.9 oz (133.1 kg)  BMI 37.66 kg/m2  SpO2 100%  HEMODYNAMICS: CVP:  [10 mmHg-13 mmHg] 11 mmHg  VENTILATOR SETTINGS: Vent Mode:  [-] PRVC FiO2 (%):  [50 %-60 %] 50 % Set Rate:  [30 bmp] 30 bmp Vt Set:  [600 mL] 600 mL PEEP:  [5 cmH20] 5 cmH20 Plateau Pressure:  [22 cmH20-28 cmH20] 25 cmH20  INTAKE / OUTPUT: I/O last 3 completed shifts: In: 8177.8 [I.V.:6737.8; NG/GT:1440] Out: 6921 [Other:6921]  PHYSICAL EXAMINATION: General: Acutely ill appearing male, intubated, lying in bed.  Neuro: RASS -1, moves all extremities spontaneously HEENT:  PERRL, moist mucus membranes. ETT in place. RIJ tunneled cath, LIJ CVL.  Cardiovascular:  RRR, no murmurs, gallops, or rubs.  Lungs: Air entry equal bilaterally, coarse, no rales or wheezes.  Abdomen: obese, soft, NT, mild distension, BS +  Musculoskeletal:  Intact. Skin: Intact, left antecubital wound noted  LABS:  BMET  Recent Labs Lab 04/08/15 0415  04/08/15 1606  04/09/15 0428 04/09/15 0431 04/09/15 0432  NA 147*  < > 143  < > 149* 144 145  K 3.6  < > 3.4*  < > 3.1* 3.6 3.7  CL 114*  < > 114*  < > 118* 116* 114*  CO2 19*  --  17*  --   --  17*  --   BUN 38*  < > 41*  < > 29* 41* 40*  CREATININE 3.31*  < > 3.61*  < > 2.00* 3.33* 3.10*  GLUCOSE 115*  < > 102*  < > 139* 123* 112*  < > = values in this interval not displayed. Electrolytes  Recent Labs Lab 04/07/15 0430  04/08/15 0415 04/08/15 1606 04/09/15 0431  CALCIUM 8.9  < > 9.1 8.7* 8.2*  MG 2.1  --  1.6*  --  1.5*  PHOS 3.9  < > 3.5 3.4 3.1  < > = values in this interval not  displayed. CBC  Recent Labs Lab 04/05/15 0426  04/07/15 0430  04/08/15 0415  04/08/15 2012 04/09/15 0428 04/09/15 0432  WBC 8.4  --  10.3  --  10.7*  --   --   --   --   HGB 9.9*  < > 9.8*  < > 10.3*  < > 12.9* 12.6* 11.9*  HCT 32.4*  < > 35.1*  < > 35.4*  < > 38.0* 37.0* 35.0*  PLT 137*  --  125*  --  86*  --   --   --   --   < > = values in this interval not displayed. Coag's  Recent Labs Lab 04/03/15 0440 04/04/15 0401 04/05/15 0426  APTT 42* 35 140*   Sepsis Markers  Recent Labs Lab 04/03/15 0440 04/05/15 0426  PROCALCITON 1.77 1.02   ABG  Recent Labs Lab 04/07/15 0514 04/07/15 0615  PHART 7.158* 7.199*  PCO2ART 46.7* 27.2*  PO2ART 63.0* 137*   Liver Enzymes  Recent Labs Lab 04/08/15 0415 04/08/15 1606 04/09/15 0431  ALBUMIN 3.1* 3.1* 2.9*    Glucose  Recent Labs  Lab 04/08/15 0006 04/08/15 0415 04/08/15 0728 04/08/15 1200 04/08/15 1601 04/08/15 2005  GLUCAP 132* 96 135* 117* 90 110*    Imaging Dg Chest Port 1 View  04/08/2015  CLINICAL DATA:  Intubated, CHF EXAM: PORTABLE CHEST 1 VIEW COMPARISON:  Chest radiograph from earlier today. FINDINGS: Endotracheal tube tip is 3.2 cm above the carina. Enteric tube enters the stomach, with the tip not seen on this image. Right internal jugular central venous catheter terminates in the lower third of the superior vena cava. Left internal jugular central venous catheter terminates in the upper third of the superior vena cava. Stable cardiomediastinal silhouette with mild cardiomegaly. No pneumothorax. No pleural effusion. Low lung volumes. Worsening mild pulmonary edema. Patchy consolidation at both lung bases, left greater than right, not appreciably changed. IMPRESSION: 1. Well-positioned lines and tubes. 2. Worsening mild congestive heart failure. 3. Low lung volumes. Patchy bibasilar lung consolidation, left greater than right, favor atelectasis, cannot exclude a component of pneumonia. Electronically  Signed   By: Delbert Phenix M.D.   On: 04/08/2015 15:55     STUDIES:  CT chest 1/12 >> Bibasilar atelectasis CT neck 1/12 >> Artifact with movement, basilar consolidation with air bronchograms, no mass  CULTURES: 1/8 Blood x 2 >> neg 1/8 Urine >> not done 1/12 Sputum >> Oral flora 1/11 RVP >> neg  ANTIBIOTICS: 1/8 vanc >> 1/12 1/8 zoysn >> 1/12  SIGNIFICANT EVENTS: 1/6  Discharge from [facility] following intubation 1/8  Admitted, intubated 1/9  Extubated 1/11  Re-intubated 1/13  Hypothermia on cvvhd 1/18  ETT changed d/t cuff leak.   LINES/TUBES: 1/8 ETT>>01/09>>>1/11>>> LIJ CVL 1/11 >>   DISCUSSION: 55 yo MO AAF non compliant with HD and CPAP and re-presents with hypercarbic and hypoxic resp failure  ASSESSMENT / PLAN:  PULMONARY A: VDRF/Hypercarbic respiratory failure OSA/OHS  Difficult Airway Bibasilar atelectasis Cuff leak; ETT changed 1/18 P:   Full vent support Wean PEEP / FiO2 for sats > 92% Daily SBT Trach scheduled for 1/20 with ENT Albuterol prn for wheezing Upright positioning as tolerated   CARDIOVASCULAR A:  HTN Pulmonary HTN MVR Continued Hypotension, most likely 2/2 sedation Chronic Combined CHF P:  CVP 11 this AM Neo gtt for MAP < 65 Hold antihypertensives NEGATIVE balance  Stress dose steroids; hydrocortisone to 50 q12h  RENAL A:   ESRD on HD, non-compliant, RIJ tunneled cath Non-Anion Gap Metabolic Acidosis Hypomagnesemia Hypernatremia P:   CRRT per renal, even/negative balance Bicarb per renal Start free water per tube 300 mL q6h Follow BMP Supp Mag today  GASTROINTESTINAL A:   GI PPx Nutrition Consitpation P:   Protonix Tube Feeds as tolerated  Senna bid + Miralax daily  HEMATOLOGIC A:   Anemia of chronic illness Thrombocytopenia DVT PPx P:  Repeat CBC in AM SCD's No heparin  INFECTIOUS A:   Left antecubital wound  Dependent Airspace Disease  P:   Repeat CBC in AM, follow fever curve Follow  cultures  ENDOCRINE A:   No acute issues P:   Glucose on BMP  NEUROLOGIC A:   Metabolic Encephalopathy 2/2 hypercarbia Agitation P:   RASS goal: 0, -1 Continue home amitriptyline  Fentanyl gtt, attempt to turn off Versed gtt Continue Seroquel 25 mg daily Haldol prn Daily WUA    FAMILY  - Updates: Family and patient updated at bedside 1/19, okay to proceed with trach and PEG placement  Lauris Chroman, MD PGY-3, Internal Medicine Pager: [phone]  Attending Note:  55 year old male with  extensive PMH who presenting with acute on chronic respiratory failure due to fluid overload, PNA and deconditioning.  On exam, lungs with diffuse crackles still.  CVVH going at -50 ml/hr.  Plan on tracheostomy tomorrow at 9:30 AM in the OR via ENT.  Will call IR for G-tube placement.  Continue stress dose steroids.  Will attempt PS weaning today as patient is able.    The patient is critically ill with multiple organ systems failure and requires high complexity decision making for assessment and support, frequent evaluation and titration of therapies, application of advanced monitoring technologies and extensive interpretation of multiple databases.   Critical Care Time devoted to patient care services described in this note is  35  Minutes. This time reflects time of care of this signee Dr Koren Bound. This critical care time does not reflect procedure time, or teaching time or supervisory time of PA/NP/Med student/Med Resident etc but could involve care discussion time.  Alyson Reedy, M.D. [facility] Pulmonary/Critical Care Medicine. Pager: [phone]. After hours pager: [phone].

## 2015-04-10 ENCOUNTER — Encounter (HOSPITAL_COMMUNITY)
Admission: EM | Disposition: A | Discharge status: Nursing Home (Includes ICF) | Payer: Self-pay | Source: Home / Self Care | Attending: Internal Medicine

## 2015-04-10 ENCOUNTER — Inpatient Hospital Stay (HOSPITAL_COMMUNITY): Payer: Medicare Other | Admitting: Certified Registered Nurse Anesthetist

## 2015-04-10 ENCOUNTER — Inpatient Hospital Stay (HOSPITAL_COMMUNITY): Payer: Medicare Other

## 2015-04-10 DIAGNOSIS — E46 Unspecified protein-calorie malnutrition: Secondary | ICD-10-CM | POA: Insufficient documentation

## 2015-04-10 HISTORY — PX: TRACHEOSTOMY TUBE PLACEMENT: SHX814

## 2015-04-10 LAB — POCT I-STAT, CHEM 8
BUN: 43 mg/dL — ABNORMAL HIGH (ref 6–20)
BUN: 29 mg/dL — ABNORMAL HIGH (ref 6–20)
BUN: 43 mg/dL — AB (ref 6–20)
BUN: 29 mg/dL — AB (ref 6–20)
BUN: 33 mg/dL — ABNORMAL HIGH (ref 6–20)
BUN: 27 mg/dL — AB (ref 6–20)
BUN: 52 mg/dL — AB (ref 6–20)
BUN: 27 mg/dL — AB (ref 6–20)
BUN: 57 mg/dL — AB (ref 6–20)
BUN: 26 mg/dL — AB (ref 6–20)
BUN: 38 mg/dL — ABNORMAL HIGH (ref 6–20)
BUN: 25 mg/dL — ABNORMAL HIGH (ref 6–20)
BUN: 49 mg/dL — AB (ref 6–20)
BUN: 26 mg/dL — AB (ref 6–20)
BUN: 47 mg/dL — AB (ref 6–20)
BUN: 49 mg/dL — AB (ref 6–20)
CALCIUM ION: 0.88 mmol/L — AB (ref 1.12–1.23)
CALCIUM ION: 0.39 mmol/L — AB (ref 1.12–1.23)
CALCIUM ION: 0.99 mmol/L — AB (ref 1.12–1.23)
CALCIUM ION: 0.43 mmol/L — AB (ref 1.12–1.23)
CALCIUM ION: 0.99 mmol/L — AB (ref 1.12–1.23)
CALCIUM ION: 0.5 mmol/L — AB (ref 1.12–1.23)
CALCIUM ION: 0.94 mmol/L — AB (ref 1.12–1.23)
CALCIUM ION: 0.42 mmol/L — AB (ref 1.12–1.23)
CHLORIDE: 120 mmol/L — AB (ref 101–111)
CHLORIDE: 114 mmol/L — AB (ref 101–111)
CHLORIDE: 112 mmol/L — AB (ref 101–111)
CHLORIDE: 126 mmol/L — AB (ref 101–111)
CHLORIDE: 112 mmol/L — AB (ref 101–111)
CHLORIDE: 115 mmol/L — AB (ref 101–111)
CHLORIDE: 109 mmol/L (ref 101–111)
CHLORIDE: 114 mmol/L — AB (ref 101–111)
CHLORIDE: 114 mmol/L — AB (ref 101–111)
CHLORIDE: 109 mmol/L (ref 101–111)
CREATININE: 2.5 mg/dL — AB (ref 0.61–1.24)
CREATININE: 2.1 mg/dL — AB (ref 0.61–1.24)
CREATININE: 2.3 mg/dL — AB (ref 0.61–1.24)
CREATININE: 2.1 mg/dL — AB (ref 0.61–1.24)
CREATININE: 2.9 mg/dL — AB (ref 0.61–1.24)
CREATININE: 2.1 mg/dL — AB (ref 0.61–1.24)
CREATININE: 2.9 mg/dL — AB (ref 0.61–1.24)
CREATININE: 2 mg/dL — AB (ref 0.61–1.24)
CREATININE: 2.6 mg/dL — AB (ref 0.61–1.24)
CREATININE: 2 mg/dL — AB (ref 0.61–1.24)
CREATININE: 2.7 mg/dL — AB (ref 0.61–1.24)
CREATININE: 2.7 mg/dL — AB (ref 0.61–1.24)
Calcium, Ion: 0.94 mmol/L — ABNORMAL LOW (ref 1.12–1.23)
Calcium, Ion: 0.46 mmol/L — CL (ref 1.12–1.23)
Calcium, Ion: 0.46 mmol/L — CL (ref 1.12–1.23)
Calcium, Ion: 1.03 mmol/L — ABNORMAL LOW (ref 1.12–1.23)
Calcium, Ion: 0.94 mmol/L — ABNORMAL LOW (ref 1.12–1.23)
Calcium, Ion: 0.5 mmol/L — CL (ref 1.12–1.23)
Calcium, Ion: 0.94 mmol/L — ABNORMAL LOW (ref 1.12–1.23)
Calcium, Ion: 0.93 mmol/L — ABNORMAL LOW (ref 1.12–1.23)
Chloride: 115 mmol/L — ABNORMAL HIGH (ref 101–111)
Chloride: 111 mmol/L (ref 101–111)
Chloride: 113 mmol/L — ABNORMAL HIGH (ref 101–111)
Chloride: 113 mmol/L — ABNORMAL HIGH (ref 101–111)
Chloride: 112 mmol/L — ABNORMAL HIGH (ref 101–111)
Chloride: 115 mmol/L — ABNORMAL HIGH (ref 101–111)
Creatinine, Ser: 2 mg/dL — ABNORMAL HIGH (ref 0.61–1.24)
Creatinine, Ser: 2.1 mg/dL — ABNORMAL HIGH (ref 0.61–1.24)
Creatinine, Ser: 1.9 mg/dL — ABNORMAL HIGH (ref 0.61–1.24)
Creatinine, Ser: 2.8 mg/dL — ABNORMAL HIGH (ref 0.61–1.24)
GLUCOSE: 85 mg/dL (ref 65–99)
GLUCOSE: 174 mg/dL — AB (ref 65–99)
GLUCOSE: 82 mg/dL (ref 65–99)
GLUCOSE: 107 mg/dL — AB (ref 65–99)
GLUCOSE: 142 mg/dL — AB (ref 65–99)
GLUCOSE: 167 mg/dL — AB (ref 65–99)
GLUCOSE: 140 mg/dL — AB (ref 65–99)
GLUCOSE: 142 mg/dL — AB (ref 65–99)
GLUCOSE: 114 mg/dL — AB (ref 65–99)
GLUCOSE: 133 mg/dL — AB (ref 65–99)
Glucose, Bld: 117 mg/dL — ABNORMAL HIGH (ref 65–99)
Glucose, Bld: 133 mg/dL — ABNORMAL HIGH (ref 65–99)
Glucose, Bld: 143 mg/dL — ABNORMAL HIGH (ref 65–99)
Glucose, Bld: 164 mg/dL — ABNORMAL HIGH (ref 65–99)
Glucose, Bld: 177 mg/dL — ABNORMAL HIGH (ref 65–99)
Glucose, Bld: 150 mg/dL — ABNORMAL HIGH (ref 65–99)
HCT: 28 % — ABNORMAL LOW (ref 39.0–52.0)
HCT: 30 % — ABNORMAL LOW (ref 39.0–52.0)
HCT: 36 % — ABNORMAL LOW (ref 39.0–52.0)
HCT: 31 % — ABNORMAL LOW (ref 39.0–52.0)
HCT: 38 % — ABNORMAL LOW (ref 39.0–52.0)
HCT: 26 % — ABNORMAL LOW (ref 39.0–52.0)
HCT: 35 % — ABNORMAL LOW (ref 39.0–52.0)
HCT: 31 % — ABNORMAL LOW (ref 39.0–52.0)
HCT: 35 % — ABNORMAL LOW (ref 39.0–52.0)
HCT: 31 % — ABNORMAL LOW (ref 39.0–52.0)
HCT: 34 % — ABNORMAL LOW (ref 39.0–52.0)
HCT: 32 % — ABNORMAL LOW (ref 39.0–52.0)
HEMATOCRIT: 32 % — AB (ref 39.0–52.0)
HEMATOCRIT: 35 % — AB (ref 39.0–52.0)
HEMATOCRIT: 33 % — AB (ref 39.0–52.0)
HEMATOCRIT: 31 % — AB (ref 39.0–52.0)
HEMOGLOBIN: 10.5 g/dL — AB (ref 13.0–17.0)
HEMOGLOBIN: 10.9 g/dL — AB (ref 13.0–17.0)
HEMOGLOBIN: 11.9 g/dL — AB (ref 13.0–17.0)
HEMOGLOBIN: 11.2 g/dL — AB (ref 13.0–17.0)
HEMOGLOBIN: 11.6 g/dL — AB (ref 13.0–17.0)
Hemoglobin: 9.5 g/dL — ABNORMAL LOW (ref 13.0–17.0)
Hemoglobin: 10.2 g/dL — ABNORMAL LOW (ref 13.0–17.0)
Hemoglobin: 12.2 g/dL — ABNORMAL LOW (ref 13.0–17.0)
Hemoglobin: 12.9 g/dL — ABNORMAL LOW (ref 13.0–17.0)
Hemoglobin: 11.9 g/dL — ABNORMAL LOW (ref 13.0–17.0)
Hemoglobin: 8.8 g/dL — ABNORMAL LOW (ref 13.0–17.0)
Hemoglobin: 10.5 g/dL — ABNORMAL LOW (ref 13.0–17.0)
Hemoglobin: 11.9 g/dL — ABNORMAL LOW (ref 13.0–17.0)
Hemoglobin: 10.5 g/dL — ABNORMAL LOW (ref 13.0–17.0)
Hemoglobin: 10.5 g/dL — ABNORMAL LOW (ref 13.0–17.0)
Hemoglobin: 10.9 g/dL — ABNORMAL LOW (ref 13.0–17.0)
POTASSIUM: 2.9 mmol/L — AB (ref 3.5–5.1)
POTASSIUM: 2.9 mmol/L — AB (ref 3.5–5.1)
POTASSIUM: 6.8 mmol/L — AB (ref 3.5–5.1)
POTASSIUM: 2.8 mmol/L — AB (ref 3.5–5.1)
POTASSIUM: 3 mmol/L — AB (ref 3.5–5.1)
POTASSIUM: 4 mmol/L (ref 3.5–5.1)
POTASSIUM: 3 mmol/L — AB (ref 3.5–5.1)
POTASSIUM: 4.3 mmol/L (ref 3.5–5.1)
POTASSIUM: 4.2 mmol/L (ref 3.5–5.1)
Potassium: 3.8 mmol/L (ref 3.5–5.1)
Potassium: 3 mmol/L — ABNORMAL LOW (ref 3.5–5.1)
Potassium: 3.3 mmol/L — ABNORMAL LOW (ref 3.5–5.1)
Potassium: 2.5 mmol/L — CL (ref 3.5–5.1)
Potassium: 2.9 mmol/L — ABNORMAL LOW (ref 3.5–5.1)
Potassium: 6.4 mmol/L (ref 3.5–5.1)
Potassium: 2.8 mmol/L — ABNORMAL LOW (ref 3.5–5.1)
SODIUM: 143 mmol/L (ref 135–145)
SODIUM: 145 mmol/L (ref 135–145)
SODIUM: 146 mmol/L — AB (ref 135–145)
Sodium: 146 mmol/L — ABNORMAL HIGH (ref 135–145)
Sodium: 146 mmol/L — ABNORMAL HIGH (ref 135–145)
Sodium: 146 mmol/L — ABNORMAL HIGH (ref 135–145)
Sodium: 125 mmol/L — ABNORMAL LOW (ref 135–145)
Sodium: 137 mmol/L (ref 135–145)
Sodium: 145 mmol/L (ref 135–145)
Sodium: 142 mmol/L (ref 135–145)
Sodium: 146 mmol/L — ABNORMAL HIGH (ref 135–145)
Sodium: 141 mmol/L (ref 135–145)
Sodium: 147 mmol/L — ABNORMAL HIGH (ref 135–145)
Sodium: 141 mmol/L (ref 135–145)
Sodium: 147 mmol/L — ABNORMAL HIGH (ref 135–145)
Sodium: 142 mmol/L (ref 135–145)
TCO2: 14 mmol/L (ref 0–100)
TCO2: 11 mmol/L (ref 0–100)
TCO2: 15 mmol/L (ref 0–100)
TCO2: 14 mmol/L (ref 0–100)
TCO2: 16 mmol/L (ref 0–100)
TCO2: 12 mmol/L (ref 0–100)
TCO2: 21 mmol/L (ref 0–100)
TCO2: 12 mmol/L (ref 0–100)
TCO2: 18 mmol/L (ref 0–100)
TCO2: 12 mmol/L (ref 0–100)
TCO2: 17 mmol/L (ref 0–100)
TCO2: 11 mmol/L (ref 0–100)
TCO2: 17 mmol/L (ref 0–100)
TCO2: 13 mmol/L (ref 0–100)
TCO2: 19 mmol/L (ref 0–100)
TCO2: 19 mmol/L (ref 0–100)

## 2015-04-10 LAB — RENAL FUNCTION PANEL
ALBUMIN: 2.7 g/dL — AB (ref 3.5–5.0)
ALBUMIN: 2.6 g/dL — AB (ref 3.5–5.0)
ANION GAP: 15 (ref 5–15)
ANION GAP: 12 (ref 5–15)
BUN: 40 mg/dL — AB (ref 6–20)
BUN: 36 mg/dL — ABNORMAL HIGH (ref 6–20)
CALCIUM: 9.1 mg/dL (ref 8.9–10.3)
CALCIUM: 8.3 mg/dL — AB (ref 8.9–10.3)
CO2: 18 mmol/L — AB (ref 22–32)
CO2: 16 mmol/L — ABNORMAL LOW (ref 22–32)
Chloride: 107 mmol/L (ref 101–111)
Chloride: 111 mmol/L (ref 101–111)
Creatinine, Ser: 3.15 mg/dL — ABNORMAL HIGH (ref 0.61–1.24)
Creatinine, Ser: 3.03 mg/dL — ABNORMAL HIGH (ref 0.61–1.24)
GFR calc Af Amer: 24 mL/min — ABNORMAL LOW (ref >60)
GFR calc non Af Amer: 21 mL/min — ABNORMAL LOW (ref >60)
GFR, EST AFRICAN AMERICAN: 25 mL/min — AB (ref >60)
GFR, EST NON AFRICAN AMERICAN: 22 mL/min — AB (ref >60)
GLUCOSE: 92 mg/dL (ref 65–99)
GLUCOSE: 137 mg/dL — AB (ref 65–99)
PHOSPHORUS: 3.1 mg/dL (ref 2.5–4.6)
PHOSPHORUS: 2.2 mg/dL — AB (ref 2.5–4.6)
POTASSIUM: 4 mmol/L (ref 3.5–5.1)
Potassium: 3.1 mmol/L — ABNORMAL LOW (ref 3.5–5.1)
SODIUM: 140 mmol/L (ref 135–145)
SODIUM: 139 mmol/L (ref 135–145)

## 2015-04-10 LAB — CBC WITH DIFFERENTIAL/PLATELET
BASOS PCT: 0 %
Basophils Absolute: 0 10*3/uL (ref 0.0–0.1)
EOS ABS: 0.4 10*3/uL (ref 0.0–0.7)
Eosinophils Relative: 4 %
HEMATOCRIT: 29.9 % — AB (ref 39.0–52.0)
HEMOGLOBIN: 9.1 g/dL — AB (ref 13.0–17.0)
LYMPHS ABS: 1.3 10*3/uL (ref 0.7–4.0)
Lymphocytes Relative: 12 %
MCH: 29.2 pg (ref 26.0–34.0)
MCHC: 30.4 g/dL (ref 30.0–36.0)
MCV: 95.8 fL (ref 78.0–100.0)
MONO ABS: 1.1 10*3/uL — AB (ref 0.1–1.0)
MONOS PCT: 10 %
NEUTROS ABS: 8.1 10*3/uL — AB (ref 1.7–7.7)
NEUTROS PCT: 74 %
Platelets: 69 10*3/uL — ABNORMAL LOW (ref 150–400)
RBC: 3.12 MIL/uL — ABNORMAL LOW (ref 4.22–5.81)
RDW: 19.7 % — AB (ref 11.5–15.5)
WBC: 11 10*3/uL — ABNORMAL HIGH (ref 4.0–10.5)

## 2015-04-10 LAB — GLUCOSE, CAPILLARY
GLUCOSE-CAPILLARY: 92 mg/dL (ref 65–99)
Glucose-Capillary: 104 mg/dL — ABNORMAL HIGH (ref 65–99)
Glucose-Capillary: 110 mg/dL — ABNORMAL HIGH (ref 65–99)
Glucose-Capillary: 123 mg/dL — ABNORMAL HIGH (ref 65–99)
Glucose-Capillary: 114 mg/dL — ABNORMAL HIGH (ref 65–99)

## 2015-04-10 LAB — MAGNESIUM: Magnesium: 1.7 mg/dL (ref 1.7–2.4)

## 2015-04-10 LAB — CALCIUM, IONIZED: CALCIUM, IONIZED, SERUM: 4.1 mg/dL — AB (ref 4.5–5.6)

## 2015-04-10 SURGERY — CREATION, TRACHEOSTOMY
Anesthesia: General

## 2015-04-10 MED ORDER — POTASSIUM CHLORIDE 10 MEQ/50ML IV SOLN
10.0000 meq | INTRAVENOUS | Status: AC
  Administered 2015-04-10 (×2): 10 meq via INTRAVENOUS
  Filled 2015-04-10 (×2): qty 50

## 2015-04-10 MED ORDER — SODIUM BICARBONATE 8.4 % IV SOLN
INTRAVENOUS | Status: DC
  Administered 2015-04-10 (×2): via INTRAVENOUS
  Filled 2015-04-10 (×5): qty 1000

## 2015-04-10 MED ORDER — SODIUM CHLORIDE 0.9 % IV SOLN
INTRAVENOUS | Status: DC | PRN
  Administered 2015-04-10: 1000 mL
  Administered 2015-04-10: 10:00:00 via INTRAVENOUS

## 2015-04-10 MED ORDER — MIDAZOLAM HCL 2 MG/2ML IJ SOLN
INTRAMUSCULAR | Status: AC
  Filled 2015-04-10: qty 2

## 2015-04-10 MED ORDER — MIDAZOLAM HCL 5 MG/5ML IJ SOLN
INTRAMUSCULAR | Status: DC | PRN
  Administered 2015-04-10: 2 mg via INTRAVENOUS

## 2015-04-10 MED ORDER — ONDANSETRON HCL 4 MG/2ML IJ SOLN
INTRAMUSCULAR | Status: AC
  Filled 2015-04-10: qty 2

## 2015-04-10 MED ORDER — SODIUM BICARBONATE 8.4 % IV SOLN: INTRAVENOUS | Status: DC

## 2015-04-10 MED ORDER — MAGNESIUM SULFATE 2 GM/50ML IV SOLN
2.0000 g | Freq: Once | INTRAVENOUS | Status: AC
  Administered 2015-04-10: 2 g via INTRAVENOUS
  Filled 2015-04-10: qty 50

## 2015-04-10 MED ORDER — 0.9 % SODIUM CHLORIDE (POUR BTL) OPTIME
TOPICAL | Status: DC | PRN
  Administered 2015-04-10: 1000 mL

## 2015-04-10 MED ORDER — ROCURONIUM BROMIDE 50 MG/5ML IV SOLN
INTRAVENOUS | Status: AC
  Filled 2015-04-10: qty 1

## 2015-04-10 MED ORDER — PROPOFOL 10 MG/ML IV BOLUS
INTRAVENOUS | Status: AC
  Filled 2015-04-10: qty 20

## 2015-04-10 MED ORDER — FENTANYL CITRATE (PF) 250 MCG/5ML IJ SOLN
INTRAMUSCULAR | Status: AC
  Filled 2015-04-10: qty 5

## 2015-04-10 MED ORDER — LIDOCAINE HCL (CARDIAC) 20 MG/ML IV SOLN
INTRAVENOUS | Status: AC
  Filled 2015-04-10: qty 5

## 2015-04-10 MED ORDER — ROCURONIUM BROMIDE 100 MG/10ML IV SOLN
INTRAVENOUS | Status: DC | PRN
  Administered 2015-04-10: 50 mg via INTRAVENOUS
  Administered 2015-04-10: 20 mg via INTRAVENOUS
  Administered 2015-04-10: 50 mg via INTRAVENOUS

## 2015-04-10 MED ORDER — FENTANYL CITRATE (PF) 100 MCG/2ML IJ SOLN
INTRAMUSCULAR | Status: DC | PRN
  Administered 2015-04-10: 50 ug via INTRAVENOUS
  Administered 2015-04-10 (×2): 100 ug via INTRAVENOUS

## 2015-04-10 MED ORDER — SUCCINYLCHOLINE CHLORIDE 20 MG/ML IJ SOLN
INTRAMUSCULAR | Status: AC
  Filled 2015-04-10: qty 1

## 2015-04-10 MED ORDER — LIDOCAINE-EPINEPHRINE 1 %-1:100000 IJ SOLN
INTRAMUSCULAR | Status: AC
  Filled 2015-04-10: qty 1

## 2015-04-10 SURGICAL SUPPLY — 39 items
BENZOIN TINCTURE PRP APPL 2/3 (GAUZE/BANDAGES/DRESSINGS) IMPLANT
BLADE SURG 15 STRL LF DISP TIS (BLADE) ×1 IMPLANT
BLADE SURG 15 STRL SS (BLADE) ×2
CANISTER SUCTION 2500CC (MISCELLANEOUS) ×3 IMPLANT
CLEANER TIP ELECTROSURG 2X2 (MISCELLANEOUS) ×3 IMPLANT
COVER SURGICAL LIGHT HANDLE (MISCELLANEOUS) ×3 IMPLANT
DECANTER SPIKE VIAL GLASS SM (MISCELLANEOUS) IMPLANT
DRAPE PROXIMA HALF (DRAPES) IMPLANT
ELECT COATED BLADE 2.86 ST (ELECTRODE) ×3 IMPLANT
ELECT REM PT RETURN 9FT ADLT (ELECTROSURGICAL) ×3
ELECTRODE REM PT RTRN 9FT ADLT (ELECTROSURGICAL) ×1 IMPLANT
GAUZE SPONGE 4X4 16PLY XRAY LF (GAUZE/BANDAGES/DRESSINGS) ×3 IMPLANT
GLOVE ECLIPSE 7.5 STRL STRAW (GLOVE) ×3 IMPLANT
GOWN STRL REUS W/ TWL LRG LVL3 (GOWN DISPOSABLE) ×1 IMPLANT
GOWN STRL REUS W/ TWL XL LVL3 (GOWN DISPOSABLE) ×1 IMPLANT
GOWN STRL REUS W/TWL LRG LVL3 (GOWN DISPOSABLE) ×2
GOWN STRL REUS W/TWL XL LVL3 (GOWN DISPOSABLE) ×2
KIT BASIN OR (CUSTOM PROCEDURE TRAY) ×3 IMPLANT
KIT ROOM TURNOVER OR (KITS) ×3 IMPLANT
NEEDLE 27GAX1X1/2 (NEEDLE) ×3 IMPLANT
NS IRRIG 1000ML POUR BTL (IV SOLUTION) ×3 IMPLANT
PACK EENT II TURBAN DRAPE (CUSTOM PROCEDURE TRAY) ×3 IMPLANT
PAD ARMBOARD 7.5X6 YLW CONV (MISCELLANEOUS) ×6 IMPLANT
PENCIL FOOT CONTROL (ELECTRODE) ×3 IMPLANT
SUT CHROMIC 2 0 SH (SUTURE) ×3 IMPLANT
SUT ETHILON 3 0 PS 1 (SUTURE) ×3 IMPLANT
SUT SILK 4 0 (SUTURE) ×2
SUT SILK 4 0 TIE 10X30 (SUTURE) ×3 IMPLANT
SUT SILK 4 0 TIES 17X18 (SUTURE) ×3 IMPLANT
SUT SILK 4-0 18XBRD TIE 12 (SUTURE) ×1 IMPLANT
SYR 20CC LL (SYRINGE) ×3 IMPLANT
SYR CONTROL 10ML LL (SYRINGE) IMPLANT
TOWEL OR 17X24 6PK STRL BLUE (TOWEL DISPOSABLE) ×3 IMPLANT
TUBE CONNECTING 12'X1/4 (SUCTIONS) ×1
TUBE CONNECTING 12X1/4 (SUCTIONS) ×2 IMPLANT
TUBE TRACH 8.0 EXL PROX  CUF (TUBING) ×2
TUBE TRACH 8.0 EXL PROX CUF (TUBING) ×1 IMPLANT
TUBE TRACH SHILEY 10 DIST CUFF (TUBING) IMPLANT
WATER STERILE IRR 1000ML POUR (IV SOLUTION) IMPLANT

## 2015-04-10 NOTE — Interval H&P Note (Signed)
History and Physical Interval Note:  04/10/2015 10:31 AM  Gary Rivera  has presented today for surgery, with the diagnosis of ACUTE RESPIRATORY FAILURE  The various methods of treatment have been discussed with the patient and family. After consideration of risks, benefits and other options for treatment, the patient has consented to  Procedure(s): TRACHEOSTOMY (N/A) as a surgical intervention .  The patient's history has been reviewed, patient examined, no change in status, stable for surgery.  I have reviewed the patient's chart and labs.  Questions were answered to the patient's satisfaction.     [name]

## 2015-04-10 NOTE — Anesthesia Postprocedure Evaluation (Signed)
Anesthesia Post Note  Patient: Gary Rivera  Procedure(s) Performed: Procedure(s) (LRB): TRACHEOSTOMY (N/A)  Patient location during evaluation: SICU Anesthesia Type: General Level of consciousness: sedated Pain management: pain level controlled Vital Signs Assessment: post-procedure vital signs reviewed and stable Respiratory status: patient remains intubated per anesthesia plan Cardiovascular status: stable Anesthetic complications: no    Last Vitals:  Filed Vitals:   04/10/15 1000 04/10/15 1200  BP: 107/67 161/91  Pulse: 58   Temp:    Resp: 30 30    Last Pain:  Filed Vitals:   04/10/15 1219  PainSc: 0-No pain                 Reino Kent

## 2015-04-10 NOTE — Op Note (Signed)
04/10/2015 11:11 AM   PATIENT:  Gary Rivera, 55 y.o. male  PRE-OPERATIVE DIAGNOSIS:  ACUTE RESPIRATORY FAILURE  POST-OPERATIVE DIAGNOSIS:  ACUTE RESPIRATORY FAILURE   PROCEDURE:  Procedure(s): TRACHEOSTOMY  SURGEON:  Surgeon(s): Susy Frizzle, MD  ASSISTANTS: none   ANESTHESIA:   general  EBL: Minimal   DRAINS: none   LOCAL MEDICATIONS USED:  NONE  COUNTS CORRECT:  YES  PROCEDURE DETAILS: Patient was taken to the operating room and placed on the operating table in the supine position. A shoulder roll was placed for positioning. The patient was previously orally intubated. The neck was prepped and draped in a standard fashion. A vertical incision was created just above the sternal notch using electrocautery. The midline fascia was divided. The isthmus of the thyroid was divided and the upper trachea was exposed. A tracheotomy was created between the first and second  tracheal rings in a horizontal fashion. A lower tracheal flap was created with scissors and the flap was sutured to the cervical skin using 2-0 chromic suture. The orotracheal tube was removed. The #8 Shiley XLT tracheostomy tube was placed without difficulty and the cuff was inflated. The shield was secured to the neck using a Velcro straps and nylon suture. The patient was then transferred back to the intensive care unit in critical condition.  PLAN OF CARE: Transfer to ICU  PATIENT DISPOSITION:  ICU - hemodynamically stable.

## 2015-04-10 NOTE — H&P (View-Only) (Signed)
ENT CONSULT:  Reason for Consult: Recurrent respiratory failure Referring Physician: CCM  Gary Rivera is an 55 y.o. male.  HPI: Pt adm for respiratory failure requiring intubation for vent and airway management. Multiple intubations over the last several weeks.  Past Medical History  Diagnosis Date  . ESRD on hemodialysis ([place])     Started dialysis with PD in 2013 and switched to HD in spring 2014 because of fungal peritonitis.  Takes dialysis at home 5 days a week approx 3h 15 min each session.     . CHF (congestive heart failure) ([place])   . Peritoneal dialysis catheter in place [facility])   . Anemia     patient reporats that last hgb 7.9 a week ago  . Anxiety state, unspecified   . Hypertension     Past Surgical History  Procedure Laterality Date  . Appendectomy    . Peritoneal catheter insertion    . Tee without cardioversion N/A 07/11/2013    Procedure: TRANSESOPHAGEAL ECHOCARDIOGRAM (TEE);  Surgeon: Fay Records, MD;  Location: [facility] ENDOSCOPY;  Service: Cardiovascular;  Laterality: N/A;  . Cardioversion N/A 07/11/2013    Procedure: CARDIOVERSION;  Surgeon: Fay Records, MD;  Location: [place];  Service: Cardiovascular;  Laterality: N/A;  . Knee arthroscopy Right 05/18/2014    Procedure: ARTHROSCOPIC WASHOUT OF RIGHT KNEE;  Surgeon: Renette Butters, MD;  Location: [place];  Service: Orthopedics;  Laterality: Right;  . Av fistula placement Left 02/18/2015    Procedure: Left Brachial Cephalic ARTERIOVENOUS FISTULA CREATION;  Surgeon: Elam Dutch, MD;  Location: [place];  Service: Vascular;  Laterality: Left;  . Insertion of dialysis catheter Right 02/18/2015    Procedure: INSERTION OF DIALYSIS CATHETER Right Internal Jugular;  Surgeon: Elam Dutch, MD;  Location: [facility] OR;  Service: Vascular;  Laterality: Right;  . Central venous catheter insertion      Family History  Problem Relation Age of Onset  . Diabetes Mother   . Hypertension Mother   . Hypertension Sister   .  Asthma Sister     Social History:  reports that he has never smoked. He has never used smokeless tobacco. He reports that he does not drink alcohol or use illicit drugs.  Allergies:  Allergies  Allergen Reactions  . Renvela [Sevelamer] Nausea And Vomiting    Medications: I have reviewed the patient's current medications.  Results for orders placed or performed during the hospital encounter of 03/29/15 (from the past 48 hour(s))  Glucose, capillary     Status: None   Collection Time: 04/05/15  7:47 PM  Result Value Ref Range   Glucose-Capillary 69 65 - 99 mg/dL   Comment 1 Capillary Specimen   Glucose, capillary     Status: None   Collection Time: 04/05/15  9:14 PM  Result Value Ref Range   Glucose-Capillary 82 65 - 99 mg/dL   Comment 1 Capillary Specimen   Glucose, capillary     Status: None   Collection Time: 04/06/15 12:09 AM  Result Value Ref Range   Glucose-Capillary 90 65 - 99 mg/dL   Comment 1 Capillary Specimen   I-STAT, chem 8     Status: Abnormal   Collection Time: 04/06/15  3:06 AM  Result Value Ref Range   Sodium 146 (H) 135 - 145 mmol/L   Potassium 3.3 (L) 3.5 - 5.1 mmol/L   Chloride 112 (H) 101 - 111 mmol/L   BUN 28 (H) 6 - 20 mg/dL   Creatinine, Ser  2.00 (H) 0.61 - 1.24 mg/dL   Glucose, Bld 147 (H) 65 - 99 mg/dL   Calcium, Ion 0.45 (LL) 1.12 - 1.23 mmol/L   TCO2 16 0 - 100 mmol/L   Hemoglobin 12.2 (L) 13.0 - 17.0 g/dL   HCT 36.0 (L) 39.0 - 52.0 %  I-STAT, chem 8     Status: Abnormal   Collection Time: 04/06/15  3:28 AM  Result Value Ref Range   Sodium 142 135 - 145 mmol/L   Potassium 4.2 3.5 - 5.1 mmol/L   Chloride 106 101 - 111 mmol/L   BUN 38 (H) 6 - 20 mg/dL   Creatinine, Ser 3.40 (H) 0.61 - 1.24 mg/dL   Glucose, Bld 133 (H) 65 - 99 mg/dL   Calcium, Ion 1.08 (L) 1.12 - 1.23 mmol/L   TCO2 25 0 - 100 mmol/L   Hemoglobin 12.6 (L) 13.0 - 17.0 g/dL   HCT 37.0 (L) 39.0 - 52.0 %  Glucose, capillary     Status: Abnormal   Collection Time: 04/06/15   4:23 AM  Result Value Ref Range   Glucose-Capillary 120 (H) 65 - 99 mg/dL   Comment 1 Venous Specimen   I-STAT, chem 8     Status: Abnormal   Collection Time: 04/06/15  5:13 AM  Result Value Ref Range   Sodium 147 (H) 135 - 145 mmol/L   Potassium 3.5 3.5 - 5.1 mmol/L   Chloride 114 (H) 101 - 111 mmol/L   BUN 28 (H) 6 - 20 mg/dL   Creatinine, Ser 2.10 (H) 0.61 - 1.24 mg/dL   Glucose, Bld 152 (H) 65 - 99 mg/dL   Calcium, Ion 0.42 (LL) 1.12 - 1.23 mmol/L   TCO2 16 0 - 100 mmol/L   Hemoglobin 12.9 (L) 13.0 - 17.0 g/dL   HCT 38.0 (L) 39.0 - 52.0 %  I-STAT, chem 8     Status: Abnormal   Collection Time: 04/06/15  5:16 AM  Result Value Ref Range   Sodium 141 135 - 145 mmol/L   Potassium 4.0 3.5 - 5.1 mmol/L   Chloride 108 101 - 111 mmol/L   BUN 37 (H) 6 - 20 mg/dL   Creatinine, Ser 3.20 (H) 0.61 - 1.24 mg/dL   Glucose, Bld 127 (H) 65 - 99 mg/dL   Calcium, Ion 1.07 (L) 1.12 - 1.23 mmol/L   TCO2 23 0 - 100 mmol/L   Hemoglobin 11.9 (L) 13.0 - 17.0 g/dL   HCT 35.0 (L) 39.0 - 52.0 %  Magnesium     Status: None   Collection Time: 04/06/15  6:03 AM  Result Value Ref Range   Magnesium 2.4 1.7 - 2.4 mg/dL  Renal function panel     Status: Abnormal   Collection Time: 04/06/15  6:03 AM  Result Value Ref Range   Sodium 141 135 - 145 mmol/L   Potassium 3.9 3.5 - 5.1 mmol/L   Chloride 109 101 - 111 mmol/L   CO2 23 22 - 32 mmol/L   Glucose, Bld 123 (H) 65 - 99 mg/dL   BUN 36 (H) 6 - 20 mg/dL   Creatinine, Ser 3.28 (H) 0.61 - 1.24 mg/dL   Calcium 8.7 (L) 8.9 - 10.3 mg/dL   Phosphorus 2.6 2.5 - 4.6 mg/dL   Albumin 3.2 (L) 3.5 - 5.0 g/dL   GFR calc non Af Amer 20 (L) >60 mL/min   GFR calc Af Amer 23 (L) >60 mL/min    Comment: (NOTE) The eGFR has been calculated using  the CKD EPI equation. This calculation has not been validated in all clinical situations. eGFR's persistently <60 mL/min signify possible Chronic Kidney Disease.    Anion gap 9 5 - 15  Calcium, ionized     Status: Abnormal    Collection Time: 04/06/15  6:03 AM  Result Value Ref Range   Calcium, Ionized, Serum 4.2 (L) 4.5 - 5.6 mg/dL    Comment: (NOTE) Performed At: [facility] [place], [place] [id number] Lindon Romp MD [id number]   I-STAT, [place] Clap 8     Status: Abnormal   Collection Time: 04/06/15  7:01 AM  Result Value Ref Range   Sodium 147 (H) 135 - 145 mmol/L   Potassium 3.6 3.5 - 5.1 mmol/L   Chloride 114 (H) 101 - 111 mmol/L   BUN 28 (H) 6 - 20 mg/dL   Creatinine, Ser 2.10 (H) 0.61 - 1.24 mg/dL   Glucose, Bld 155 (H) 65 - 99 mg/dL   Calcium, Ion 0.42 (LL) 1.12 - 1.23 mmol/L   TCO2 15 0 - 100 mmol/L   Hemoglobin 13.3 13.0 - 17.0 g/dL   HCT 39.0 39.0 - 52.0 %  I-STAT, chem 8     Status: Abnormal   Collection Time: 04/06/15  7:04 AM  Result Value Ref Range   Sodium 142 135 - 145 mmol/L   Potassium 4.0 3.5 - 5.1 mmol/L   Chloride 109 101 - 111 mmol/L   BUN 37 (H) 6 - 20 mg/dL   Creatinine, Ser 3.00 (H) 0.61 - 1.24 mg/dL   Glucose, Bld 126 (H) 65 - 99 mg/dL   Calcium, Ion 1.05 (L) 1.12 - 1.23 mmol/L   TCO2 21 0 - 100 mmol/L   Hemoglobin 12.2 (L) 13.0 - 17.0 g/dL   HCT 36.0 (L) 39.0 - 52.0 %  Glucose, capillary     Status: Abnormal   Collection Time: 04/06/15  7:51 AM  Result Value Ref Range   Glucose-Capillary 102 (H) 65 - 99 mg/dL  I-STAT, chem 8     Status: Abnormal   Collection Time: 04/06/15  8:56 AM  Result Value Ref Range   Sodium 148 (H) 135 - 145 mmol/L   Potassium 3.5 3.5 - 5.1 mmol/L   Chloride 113 (H) 101 - 111 mmol/L   BUN 29 (H) 6 - 20 mg/dL   Creatinine, Ser 2.30 (H) 0.61 - 1.24 mg/dL   Glucose, Bld 155 (H) 65 - 99 mg/dL   Calcium, Ion 0.41 (LL) 1.12 - 1.23 mmol/L   TCO2 17 0 - 100 mmol/L   Hemoglobin 13.6 13.0 - 17.0 g/dL   HCT 40.0 39.0 - 52.0 %  I-STAT, chem 8     Status: Abnormal   Collection Time: 04/06/15  8:59 AM  Result Value Ref Range   Sodium 143 135 - 145 mmol/L   Potassium 3.9 3.5 - 5.1 mmol/L   Chloride 110 101 - 111 mmol/L    BUN 36 (H) 6 - 20 mg/dL   Creatinine, Ser 3.00 (H) 0.61 - 1.24 mg/dL   Glucose, Bld 115 (H) 65 - 99 mg/dL   Calcium, Ion 1.10 (L) 1.12 - 1.23 mmol/L   TCO2 22 0 - 100 mmol/L   Hemoglobin 12.6 (L) 13.0 - 17.0 g/dL   HCT 37.0 (L) 39.0 - 52.0 %  Glucose, capillary     Status: Abnormal   Collection Time: 04/06/15 11:16 AM  Result Value Ref Range   Glucose-Capillary 129 (H) 65 - 99 mg/dL  Comment 1 Capillary Specimen   I-STAT, chem 8     Status: Abnormal   Collection Time: 04/06/15  1:43 PM  Result Value Ref Range   Sodium 148 (H) 135 - 145 mmol/L   Potassium 3.7 3.5 - 5.1 mmol/L   Chloride 117 (H) 101 - 111 mmol/L   BUN 27 (H) 6 - 20 mg/dL   Creatinine, Ser 2.00 (H) 0.61 - 1.24 mg/dL   Glucose, Bld 141 (H) 65 - 99 mg/dL   Calcium, Ion 0.41 (LL) 1.12 - 1.23 mmol/L   TCO2 15 0 - 100 mmol/L   Hemoglobin 13.9 13.0 - 17.0 g/dL   HCT 41.0 39.0 - 52.0 %   Comment VALUES EXPECTED, NO REPEAT   I-STAT, chem 8     Status: Abnormal   Collection Time: 04/06/15  1:49 PM  Result Value Ref Range   Sodium 146 (H) 135 - 145 mmol/L   Potassium 3.9 3.5 - 5.1 mmol/L   Chloride 113 (H) 101 - 111 mmol/L   BUN 35 (H) 6 - 20 mg/dL   Creatinine, Ser 2.80 (H) 0.61 - 1.24 mg/dL   Glucose, Bld 105 (H) 65 - 99 mg/dL   Calcium, Ion 1.07 (L) 1.12 - 1.23 mmol/L   TCO2 21 0 - 100 mmol/L   Hemoglobin 12.2 (L) 13.0 - 17.0 g/dL   HCT 36.0 (L) 39.0 - 52.0 %  Glucose, capillary     Status: Abnormal   Collection Time: 04/06/15  3:42 PM  Result Value Ref Range   Glucose-Capillary 105 (H) 65 - 99 mg/dL   Comment 1 Venous Specimen   Renal function panel (daily at 1600)     Status: Abnormal   Collection Time: 04/06/15  3:46 PM  Result Value Ref Range   Sodium 144 135 - 145 mmol/L   Potassium 4.2 3.5 - 5.1 mmol/L   Chloride 112 (H) 101 - 111 mmol/L   CO2 21 (L) 22 - 32 mmol/L   Glucose, Bld 117 (H) 65 - 99 mg/dL   BUN 35 (H) 6 - 20 mg/dL   Creatinine, Ser 3.17 (H) 0.61 - 1.24 mg/dL   Calcium 8.6 (L) 8.9 -  10.3 mg/dL   Phosphorus 3.8 2.5 - 4.6 mg/dL   Albumin 3.2 (L) 3.5 - 5.0 g/dL   GFR calc non Af Amer 21 (L) >60 mL/min   GFR calc Af Amer 24 (L) >60 mL/min    Comment: (NOTE) The eGFR has been calculated using the CKD EPI equation. This calculation has not been validated in all clinical situations. eGFR's persistently <60 mL/min signify possible Chronic Kidney Disease.    Anion gap 11 5 - 15  I-STAT, chem 8     Status: Abnormal   Collection Time: 04/06/15  4:55 PM  Result Value Ref Range   Sodium 149 (H) 135 - 145 mmol/L   Potassium 3.8 3.5 - 5.1 mmol/L   Chloride 116 (H) 101 - 111 mmol/L   BUN 29 (H) 6 - 20 mg/dL   Creatinine, Ser 2.20 (H) 0.61 - 1.24 mg/dL   Glucose, Bld 143 (H) 65 - 99 mg/dL   Calcium, Ion 0.50 (LL) 1.12 - 1.23 mmol/L   TCO2 17 0 - 100 mmol/L   Hemoglobin 14.3 13.0 - 17.0 g/dL   HCT 42.0 39.0 - 52.0 %   Comment VALUES EXPECTED, NO REPEAT   I-STAT, chem 8     Status: Abnormal   Collection Time: 04/06/15  5:06 PM  Result Value Ref Range  Sodium 146 (H) 135 - 145 mmol/L   Potassium 4.0 3.5 - 5.1 mmol/L   Chloride 114 (H) 101 - 111 mmol/L   BUN 35 (H) 6 - 20 mg/dL   Creatinine, Ser 2.80 (H) 0.61 - 1.24 mg/dL   Glucose, Bld 110 (H) 65 - 99 mg/dL   Calcium, Ion 1.09 (L) 1.12 - 1.23 mmol/L   TCO2 21 0 - 100 mmol/L   Hemoglobin 12.6 (L) 13.0 - 17.0 g/dL   HCT 37.0 (L) 39.0 - 52.0 %  Glucose, capillary     Status: None   Collection Time: 04/06/15  7:41 PM  Result Value Ref Range   Glucose-Capillary 91 65 - 99 mg/dL   Comment 1 Capillary Specimen   Glucose, capillary     Status: Abnormal   Collection Time: 04/07/15  1:13 AM  Result Value Ref Range   Glucose-Capillary 100 (H) 65 - 99 mg/dL   Comment 1 Venous Specimen   I-STAT, chem 8     Status: Abnormal   Collection Time: 04/07/15  2:10 AM  Result Value Ref Range   Sodium 149 (H) 135 - 145 mmol/L   Potassium 3.6 3.5 - 5.1 mmol/L   Chloride 120 (H) 101 - 111 mmol/L   BUN 34 (H) 6 - 20 mg/dL    Creatinine, Ser 2.40 (H) 0.61 - 1.24 mg/dL   Glucose, Bld 128 (H) 65 - 99 mg/dL   Calcium, Ion 0.49 (LL) 1.12 - 1.23 mmol/L   TCO2 14 0 - 100 mmol/L   Hemoglobin 13.3 13.0 - 17.0 g/dL   HCT 39.0 39.0 - 52.0 %   Comment NOTIFIED PHYSICIAN   I-STAT, chem 8     Status: Abnormal   Collection Time: 04/07/15  2:16 AM  Result Value Ref Range   Sodium 145 135 - 145 mmol/L   Potassium 4.2 3.5 - 5.1 mmol/L   Chloride 114 (H) 101 - 111 mmol/L   BUN 47 (H) 6 - 20 mg/dL   Creatinine, Ser 3.90 (H) 0.61 - 1.24 mg/dL   Glucose, Bld 109 (H) 65 - 99 mg/dL   Calcium, Ion 1.19 1.12 - 1.23 mmol/L   TCO2 20 0 - 100 mmol/L   Hemoglobin 12.6 (L) 13.0 - 17.0 g/dL   HCT 37.0 (L) 39.0 - 52.0 %  I-STAT, chem 8     Status: Abnormal   Collection Time: 04/07/15  4:13 AM  Result Value Ref Range   Sodium 150 (H) 135 - 145 mmol/L   Potassium 3.5 3.5 - 5.1 mmol/L   Chloride 119 (H) 101 - 111 mmol/L   BUN 31 (H) 6 - 20 mg/dL   Creatinine, Ser 2.40 (H) 0.61 - 1.24 mg/dL   Glucose, Bld 136 (H) 65 - 99 mg/dL   Calcium, Ion 0.44 (LL) 1.12 - 1.23 mmol/L   TCO2 13 0 - 100 mmol/L   Hemoglobin 12.2 (L) 13.0 - 17.0 g/dL   HCT 36.0 (L) 39.0 - 52.0 %   Comment NOTIFIED PHYSICIAN   Glucose, capillary     Status: Abnormal   Collection Time: 04/07/15  4:16 AM  Result Value Ref Range   Glucose-Capillary 102 (H) 65 - 99 mg/dL   Comment 1 Venous Specimen   I-STAT, chem 8     Status: Abnormal   Collection Time: 04/07/15  4:19 AM  Result Value Ref Range   Sodium 146 (H) 135 - 145 mmol/L   Potassium 4.2 3.5 - 5.1 mmol/L   Chloride 115 (H) 101 -  111 mmol/L   BUN 45 (H) 6 - 20 mg/dL   Creatinine, Ser 3.80 (H) 0.61 - 1.24 mg/dL   Glucose, Bld 108 (H) 65 - 99 mg/dL   Calcium, Ion 1.16 1.12 - 1.23 mmol/L   TCO2 19 0 - 100 mmol/L   Hemoglobin 11.9 (L) 13.0 - 17.0 g/dL   HCT 35.0 (L) 39.0 - 52.0 %  Magnesium     Status: None   Collection Time: 04/07/15  4:30 AM  Result Value Ref Range   Magnesium 2.1 1.7 - 2.4 mg/dL  CBC      Status: Abnormal   Collection Time: 04/07/15  4:30 AM  Result Value Ref Range   WBC 10.3 4.0 - 10.5 K/uL   RBC 3.46 (L) 4.22 - 5.81 MIL/uL   Hemoglobin 9.8 (L) 13.0 - 17.0 g/dL    Comment: DELTA CHECK NOTED REPEATED TO VERIFY    HCT 35.1 (L) 39.0 - 52.0 %   MCV 101.4 (H) 78.0 - 100.0 fL   MCH 28.3 26.0 - 34.0 pg   MCHC 27.9 (L) 30.0 - 36.0 g/dL   RDW 19.1 (H) 11.5 - 15.5 %   Platelets 125 (L) 150 - 400 K/uL    Comment: PLATELET COUNT CONFIRMED BY SMEAR  Basic metabolic panel     Status: Abnormal   Collection Time: 04/07/15  4:30 AM  Result Value Ref Range   Sodium 145 135 - 145 mmol/L   Potassium 4.2 3.5 - 5.1 mmol/L   Chloride 114 (H) 101 - 111 mmol/L   CO2 20 (L) 22 - 32 mmol/L   Glucose, Bld 110 (H) 65 - 99 mg/dL   BUN 44 (H) 6 - 20 mg/dL   Creatinine, Ser 3.82 (H) 0.61 - 1.24 mg/dL    Comment: DELTA CHECK NOTED   Calcium 8.9 8.9 - 10.3 mg/dL   GFR calc non Af Amer 16 (L) >60 mL/min   GFR calc Af Amer 19 (L) >60 mL/min    Comment: (NOTE) The eGFR has been calculated using the CKD EPI equation. This calculation has not been validated in all clinical situations. eGFR's persistently <60 mL/min signify possible Chronic Kidney Disease.    Anion gap 11 5 - 15  Phosphorus     Status: None   Collection Time: 04/07/15  4:30 AM  Result Value Ref Range   Phosphorus 3.9 2.5 - 4.6 mg/dL  Albumin     Status: Abnormal   Collection Time: 04/07/15  4:30 AM  Result Value Ref Range   Albumin 3.1 (L) 3.5 - 5.0 g/dL  I-STAT 3, arterial blood gas (G3+)     Status: Abnormal   Collection Time: 04/07/15  5:14 AM  Result Value Ref Range   pH, Arterial 7.158 (LL) 7.350 - 7.450   pCO2 arterial 46.7 (H) 35.0 - 45.0 mmHg   pO2, Arterial 63.0 (L) 80.0 - 100.0 mmHg   Bicarbonate 16.7 (L) 20.0 - 24.0 mEq/L   TCO2 18 0 - 100 mmol/L   O2 Saturation 85.0 %   Acid-base deficit 12.0 (H) 0.0 - 2.0 mmol/L   Patient temperature 97.9 F    Sample type ARTERIAL    Comment NOTIFIED PHYSICIAN    I-STAT, chem 8     Status: Abnormal   Collection Time: 04/07/15  5:58 AM  Result Value Ref Range   Sodium 151 (H) 135 - 145 mmol/L   Potassium 4.0 3.5 - 5.1 mmol/L   Chloride 121 (H) 101 - 111 mmol/L   BUN  34 (H) 6 - 20 mg/dL   Creatinine, Ser 2.50 (H) 0.61 - 1.24 mg/dL   Glucose, Bld 122 (H) 65 - 99 mg/dL   Calcium, Ion 0.57 (LL) 1.12 - 1.23 mmol/L   TCO2 14 0 - 100 mmol/L   Hemoglobin 13.3 13.0 - 17.0 g/dL   HCT 39.0 39.0 - 52.0 %   Comment NOTIFIED PHYSICIAN   I-STAT, chem 8     Status: Abnormal   Collection Time: 04/07/15  6:04 AM  Result Value Ref Range   Sodium 145 135 - 145 mmol/L   Potassium 4.1 3.5 - 5.1 mmol/L   Chloride 116 (H) 101 - 111 mmol/L   BUN 44 (H) 6 - 20 mg/dL   Creatinine, Ser 3.60 (H) 0.61 - 1.24 mg/dL   Glucose, Bld 111 (H) 65 - 99 mg/dL   Calcium, Ion 1.18 1.12 - 1.23 mmol/L   TCO2 18 0 - 100 mmol/L   Hemoglobin 12.2 (L) 13.0 - 17.0 g/dL   HCT 36.0 (L) 39.0 - 52.0 %  Blood gas, arterial     Status: Abnormal   Collection Time: 04/07/15  6:15 AM  Result Value Ref Range   FIO2 0.80    Delivery systems VENTILATOR    Mode PRESSURE REGULATED VOLUME CONTROL    VT 600 mL   LHR 30 resp/min   Peep/cpap 5.0 cm H20   pH, Arterial 7.199 (LL) 7.350 - 7.450    Comment: CRITICAL RESULT CALLED TO, READ BACK BY AND VERIFIED WITH: Anselm Pancoast, RN AT [phone] BY Berneice Gandy, RRT, RCP ON 04/07/2015     pCO2 arterial 27.2 (L) 35.0 - 45.0 mmHg   pO2, Arterial 137 (H) 80.0 - 100.0 mmHg   Bicarbonate 10.3 (L) 20.0 - 24.0 mEq/L   TCO2 11.1 0 - 100 mmol/L   Acid-base deficit 16.3 (H) 0.0 - 2.0 mmol/L   O2 Saturation 98.5 %   Patient temperature 97.9    Collection site RIGHT RADIAL    Drawn by [id number]    Sample type ARTERIAL DRAW   I-STAT, chem 8     Status: Abnormal   Collection Time: 04/07/15  8:00 AM  Result Value Ref Range   Sodium 150 (H) 135 - 145 mmol/L   Potassium 3.7 3.5 - 5.1 mmol/L   Chloride 119 (H) 101 - 111 mmol/L   BUN 32 (H) 6 - 20 mg/dL    Creatinine, Ser 2.40 (H) 0.61 - 1.24 mg/dL   Glucose, Bld 139 (H) 65 - 99 mg/dL   Calcium, Ion 0.45 (LL) 1.12 - 1.23 mmol/L   TCO2 14 0 - 100 mmol/L   Hemoglobin 13.9 13.0 - 17.0 g/dL   HCT 41.0 39.0 - 52.0 %   Comment NOTIFIED PHYSICIAN   Glucose, capillary     Status: None   Collection Time: 04/07/15  8:05 AM  Result Value Ref Range   Glucose-Capillary 89 65 - 99 mg/dL   Comment 1 Venous Specimen   I-STAT, chem 8     Status: Abnormal   Collection Time: 04/07/15  8:07 AM  Result Value Ref Range   Sodium 148 (H) 135 - 145 mmol/L   Potassium 4.5 3.5 - 5.1 mmol/L   Chloride 117 (H) 101 - 111 mmol/L   BUN 43 (H) 6 - 20 mg/dL   Creatinine, Ser 3.40 (H) 0.61 - 1.24 mg/dL   Glucose, Bld 103 (H) 65 - 99 mg/dL   Calcium, Ion 1.18 1.12 - 1.23 mmol/L   TCO2 20 0 -  100 mmol/L   Hemoglobin 13.3 13.0 - 17.0 g/dL   HCT 39.0 39.0 - 52.0 %  I-STAT, chem 8     Status: Abnormal   Collection Time: 04/07/15  9:45 AM  Result Value Ref Range   Sodium 150 (H) 135 - 145 mmol/L   Potassium 3.7 3.5 - 5.1 mmol/L   Chloride 118 (H) 101 - 111 mmol/L   BUN 32 (H) 6 - 20 mg/dL   Creatinine, Ser 2.70 (H) 0.61 - 1.24 mg/dL   Glucose, Bld 152 (H) 65 - 99 mg/dL   Calcium, Ion 0.44 (LL) 1.12 - 1.23 mmol/L   TCO2 14 0 - 100 mmol/L   Hemoglobin 13.3 13.0 - 17.0 g/dL   HCT 39.0 39.0 - 52.0 %   Comment NOTIFIED PHYSICIAN   I-STAT, chem 8     Status: Abnormal   Collection Time: 04/07/15  9:57 AM  Result Value Ref Range   Sodium 147 (H) 135 - 145 mmol/L   Potassium 4.0 3.5 - 5.1 mmol/L   Chloride 117 (H) 101 - 111 mmol/L   BUN 39 (H) 6 - 20 mg/dL   Creatinine, Ser 3.50 (H) 0.61 - 1.24 mg/dL   Glucose, Bld 112 (H) 65 - 99 mg/dL   Calcium, Ion 1.16 1.12 - 1.23 mmol/L   TCO2 18 0 - 100 mmol/L   Hemoglobin 12.2 (L) 13.0 - 17.0 g/dL   HCT 36.0 (L) 39.0 - 52.0 %  I-STAT, chem 8     Status: Abnormal   Collection Time: 04/07/15 12:00 PM  Result Value Ref Range   Sodium 148 (H) 135 - 145 mmol/L   Potassium 4.0  3.5 - 5.1 mmol/L   Chloride 119 (H) 101 - 111 mmol/L   BUN 41 (H) 6 - 20 mg/dL   Creatinine, Ser 3.70 (H) 0.61 - 1.24 mg/dL   Glucose, Bld 86 65 - 99 mg/dL   Calcium, Ion 1.22 1.12 - 1.23 mmol/L   TCO2 19 0 - 100 mmol/L   Hemoglobin 11.6 (L) 13.0 - 17.0 g/dL   HCT 34.0 (L) 39.0 - 52.0 %  I-STAT, chem 8     Status: Abnormal   Collection Time: 04/07/15 12:09 PM  Result Value Ref Range   Sodium 152 (H) 135 - 145 mmol/L   Potassium 3.5 3.5 - 5.1 mmol/L   Chloride 121 (H) 101 - 111 mmol/L   BUN 31 (H) 6 - 20 mg/dL   Creatinine, Ser 2.30 (H) 0.61 - 1.24 mg/dL   Glucose, Bld 121 (H) 65 - 99 mg/dL   Calcium, Ion 0.44 (LL) 1.12 - 1.23 mmol/L   TCO2 13 0 - 100 mmol/L   Hemoglobin 13.3 13.0 - 17.0 g/dL   HCT 39.0 39.0 - 52.0 %   Comment NOTIFIED PHYSICIAN   Glucose, capillary     Status: None   Collection Time: 04/07/15 12:32 PM  Result Value Ref Range   Glucose-Capillary 72 65 - 99 mg/dL   Comment 1 Capillary Specimen   I-STAT, chem 8     Status: Abnormal   Collection Time: 04/07/15  2:12 PM  Result Value Ref Range   Sodium 147 (H) 135 - 145 mmol/L   Potassium 3.7 3.5 - 5.1 mmol/L   Chloride 119 (H) 101 - 111 mmol/L   BUN 36 (H) 6 - 20 mg/dL   Creatinine, Ser 3.40 (H) 0.61 - 1.24 mg/dL   Glucose, Bld 106 (H) 65 - 99 mg/dL   Calcium, Ion 1.13 1.12 - 1.23  mmol/L   TCO2 17 0 - 100 mmol/L   Hemoglobin 12.2 (L) 13.0 - 17.0 g/dL   HCT 36.0 (L) 39.0 - 52.0 %  Renal function panel (daily at 1600)     Status: Abnormal   Collection Time: 04/07/15  4:15 PM  Result Value Ref Range   Sodium 146 (H) 135 - 145 mmol/L   Potassium 3.6 3.5 - 5.1 mmol/L   Chloride 116 (H) 101 - 111 mmol/L   CO2 17 (L) 22 - 32 mmol/L   Glucose, Bld 127 (H) 65 - 99 mg/dL   BUN 37 (H) 6 - 20 mg/dL   Creatinine, Ser 3.46 (H) 0.61 - 1.24 mg/dL   Calcium 9.0 8.9 - 10.3 mg/dL   Phosphorus 2.8 2.5 - 4.6 mg/dL   Albumin 3.3 (L) 3.5 - 5.0 g/dL   GFR calc non Af Amer 18 (L) >60 mL/min   GFR calc Af Amer 21 (L) >60  mL/min    Comment: (NOTE) The eGFR has been calculated using the CKD EPI equation. This calculation has not been validated in all clinical situations. eGFR's persistently <60 mL/min signify possible Chronic Kidney Disease.    Anion gap 13 5 - 15  Glucose, capillary     Status: Abnormal   Collection Time: 04/07/15  4:20 PM  Result Value Ref Range   Glucose-Capillary 105 (H) 65 - 99 mg/dL   Comment 1 Venous Specimen   I-STAT, chem 8     Status: Abnormal   Collection Time: 04/07/15  4:22 PM  Result Value Ref Range   Sodium 146 (H) 135 - 145 mmol/L   Potassium 3.6 3.5 - 5.1 mmol/L   Chloride 119 (H) 101 - 111 mmol/L   BUN 37 (H) 6 - 20 mg/dL   Creatinine, Ser 3.40 (H) 0.61 - 1.24 mg/dL   Glucose, Bld 124 (H) 65 - 99 mg/dL   Calcium, Ion 1.16 1.12 - 1.23 mmol/L   TCO2 17 0 - 100 mmol/L   Hemoglobin 12.2 (L) 13.0 - 17.0 g/dL   HCT 36.0 (L) 39.0 - 52.0 %  I-STAT, chem 8     Status: Abnormal   Collection Time: 04/07/15  4:35 PM  Result Value Ref Range   Sodium 152 (H) 135 - 145 mmol/L   Potassium 3.5 3.5 - 5.1 mmol/L   Chloride 121 (H) 101 - 111 mmol/L   BUN 32 (H) 6 - 20 mg/dL   Creatinine, Ser 2.60 (H) 0.61 - 1.24 mg/dL   Glucose, Bld 125 (H) 65 - 99 mg/dL   Calcium, Ion 0.55 (LL) 1.12 - 1.23 mmol/L   TCO2 14 0 - 100 mmol/L   Hemoglobin 13.6 13.0 - 17.0 g/dL   HCT 40.0 39.0 - 52.0 %   Comment NOTIFIED PHYSICIAN   I-STAT, chem 8     Status: Abnormal   Collection Time: 04/07/15  6:03 PM  Result Value Ref Range   Sodium 152 (H) 135 - 145 mmol/L   Potassium 3.2 (L) 3.5 - 5.1 mmol/L   Chloride 119 (H) 101 - 111 mmol/L   BUN 29 (H) 6 - 20 mg/dL   Creatinine, Ser 2.20 (H) 0.61 - 1.24 mg/dL   Glucose, Bld 150 (H) 65 - 99 mg/dL   Calcium, Ion 0.44 (LL) 1.12 - 1.23 mmol/L   TCO2 13 0 - 100 mmol/L   Hemoglobin 13.9 13.0 - 17.0 g/dL   HCT 41.0 39.0 - 52.0 %   Comment NOTIFIED PHYSICIAN     Dg Chest  Port 1 View  04/07/2015  CLINICAL DATA:  Endotracheal tube EXAM: PORTABLE CHEST  1 VIEW COMPARISON:  Yesterday FINDINGS: Low volumes with bibasilar atelectasis based on chest CT 04/02/2015. Opacity has increased, especially on the right where there is flat morphology. Marked cardiopericardial enlargement without pericardial effusion on comparison CT. Endotracheal tube in good position with tip at the clavicular heads. The orogastric tube continues to the stomach at least. Bilateral IJ catheters, tips in stable unremarkable position. No effusion or pneumothorax. Pulmonary venous congestion. IMPRESSION: 1. Stable positioning of tubes and lines. 2. Worsening bibasilar opacity, atelectasis based on recent chest CT. 3. Low volumes and pulmonary venous congestion. Electronically Signed   By: Monte Fantasia M.D.   On: 04/07/2015 07:46   Dg Chest Port 1 View  04/06/2015  CLINICAL DATA:  55 year old male with respiratory failure EXAM: PORTABLE CHEST 1 VIEW COMPARISON:  Radiograph dated 04/05/2015 FINDINGS: Endotracheal tube above the carina enteric tube coursing towards upper abdomen. Right-sided dialysis catheter and left IJ central line in stable positioning. There is stable cardiomegaly. Low lung volumes. Focal density at the right lung base similar to prior study and may represent atelectasis versus pneumonia. Left lung base opacity appears stable and may represent atelectasis/pneumonia. A small left pleural effusion may be present. No pneumothorax. IMPRESSION: No significant interval change. Electronically Signed   By: Anner Crete M.D.   On: 04/06/2015 04:34    ROS:ROS  Blood pressure 87/53, pulse 29, temperature 98.2 F (36.8 C), temperature source Oral, resp. rate 16, height _0  (1.88 m), weight 133.3 kg (293 lb 14 oz), SpO2 100 %.  PHYSICAL EXAM: General appearance - Pt OT intubated and sedated Neck - Obese, normal ant neck anatomy  Studies Reviewed:none  Assessment/Plan: ENT svc consulted for elective trach for long term airway and vent management. Will schedule -  procedure will be performed by Dr. Constance Holster in [place] on 1/20.  [place], Rudie Sermons 04/07/2015, 6:43 PM

## 2015-04-10 NOTE — Anesthesia Procedure Notes (Signed)
Date/Time: 04/10/2015 10:29 AM Performed by: Orvilla Fus A Patient Re-evaluated:Patient Re-evaluated prior to inductionOxygen Delivery Method: Circle system utilized Preoxygenation: Pre-oxygenation with 100% oxygen Intubation Type: Inhalational induction with existing ETT Placement Confirmation: positive ETCO2 and breath sounds checked- equal and bilateral Dental Injury: Teeth and Oropharynx as per pre-operative assessment

## 2015-04-10 NOTE — Consult Note (Signed)
Chief Complaint: Patient was seen in consultation today for percutaneous gastric tube placement Chief Complaint  Patient presents with  . Shortness of Breath  . Altered Mental Status   at the request of CCM  Referring Physician(s): Dr Tyson Alias  History of Present Illness: Gary Rivera is a 55 y.o. male   COPD Respiratory failure Vent now---for trach today Metabolic encephalopathy No response Thrombocytopenia (plt 69 today) Malnutrition Long term care Request for percutaneous gastric tube placement in IR Dr Loreta Ave has reviewed imaging and approves procedure  Past Medical History  Diagnosis Date  . ESRD on hemodialysis (HCC)     Started dialysis with PD in 2013 and switched to HD in spring 2014 because of fungal peritonitis.  Takes dialysis at home 5 days a week approx 3h 15 min each session.     . CHF (congestive heart failure) (HCC)   . Peritoneal dialysis catheter in place [facility])   . Anemia     patient reporats that last hgb 7.9 a week ago  . Anxiety state, unspecified   . Hypertension     Past Surgical History  Procedure Laterality Date  . Appendectomy    . Peritoneal catheter insertion    . Tee without cardioversion N/A 07/11/2013    Procedure: TRANSESOPHAGEAL ECHOCARDIOGRAM (TEE);  Surgeon: Pricilla Riffle, MD;  Location: [facility] ENDOSCOPY;  Service: Cardiovascular;  Laterality: N/A;  . Cardioversion N/A 07/11/2013    Procedure: CARDIOVERSION;  Surgeon: Pricilla Riffle, MD;  Location: [facility] ENDOSCOPY;  Service: Cardiovascular;  Laterality: N/A;  . Knee arthroscopy Right 05/18/2014    Procedure: ARTHROSCOPIC WASHOUT OF RIGHT KNEE;  Surgeon: Sheral Apley, MD;  Location: [facility] OR;  Service: Orthopedics;  Laterality: Right;  . Av fistula placement Left 02/18/2015    Procedure: Left Brachial Cephalic ARTERIOVENOUS FISTULA CREATION;  Surgeon: Sherren Kerns, MD;  Location: [facility] OR;  Service: Vascular;  Laterality: Left;  . Insertion of dialysis catheter Right 02/18/2015   Procedure: INSERTION OF DIALYSIS CATHETER Right Internal Jugular;  Surgeon: Sherren Kerns, MD;  Location[facility] OR;  Service: Vascular;  Laterality: Right;  . Central venous catheter insertion      Allergies: Renvela  Medications: Prior to Admission medications   Medication Sig Start Date End Date Taking? Authorizing Provider  albuterol (PROVENTIL HFA;VENTOLIN HFA) 108 (90 BASE) MCG/ACT inhaler Inhale 1 puff into the lungs every 6 (six) hours as needed for wheezing or shortness of breath.   Yes Historical Provider, MD  amitriptyline (ELAVIL) 25 MG tablet Take 25 mg by mouth at bedtime. Reported on 03/12/2015   Yes Historical Provider, MD  lanthanum (FOSRENOL) 1000 MG chewable tablet Chew 2 tablets (2,000 mg total) by mouth 3 (three) times daily with meals. 02/05/15  Yes Drema Dallas, MD  metoCLOPramide (REGLAN) 10 MG tablet Take 10 mg by mouth daily. 05/03/14  Yes Historical Provider, MD  multivitamin (RENA-VIT) TABS tablet Take 1 tablet by mouth at bedtime. 05/21/14  Yes [name], DO  Omega-3 1000 MG CAPS Take 1 g by mouth 2 (two) times daily.   Yes Historical Provider, MD  omeprazole (PRILOSEC) 20 MG capsule Take 20 mg by mouth daily. 02/10/15  Yes Historical Provider, MD  vitamin E 400 UNIT capsule Take 400 Units by mouth daily.   Yes Historical Provider, MD  clonazePAM (KLONOPIN) 0.5 MG tablet Take 0.5 tablets (0.25 mg total) by mouth 2 (two) times daily as needed for anxiety. 03/27/15   Rolly Salter, MD  OXYGEN 2 lpm 24/7 [facility]    Historical Provider, MD  traMADol (ULTRAM) 50 MG tablet Take 1 tablet (50 mg total) by mouth every 12 (twelve) hours as needed for severe pain. 03/27/15   Rolly Salter, MD     Family History  Problem Relation Age of Onset  . Diabetes Mother   . Hypertension Mother   . Hypertension Sister   . Asthma Sister     Social History   Social History  . Marital Status: Single    Spouse Name: N/A  . Number of Children: 0  . Years of Education: N/A    Occupational History  . disabled    Social History Main Topics  . Smoking status: Never Smoker   . Smokeless tobacco: Never Used  . Alcohol Use: No  . Drug Use: No  . Sexual Activity: Not Asked   Other Topics Concern  . None   Social History Narrative    Review of Systems: A 12 point ROS discussed and pertinent positives are indicated in the HPI above.  All other systems are negative.  Review of Systems  Constitutional:       Vent No response    Vital Signs: BP 91/58 mmHg  Pulse 63  Temp(Src) 97.8 F (36.6 C) (Oral)  Resp 30  Ht 6\' 2"  (1.88 m)  Wt 292 lb 5.3 oz (132.6 kg)  BMI 37.52 kg/m2  SpO2 100%  Physical Exam  Cardiovascular: Normal rate and regular rhythm.   Pulmonary/Chest:  On vent For trach today  Abdominal: Soft.  Skin: Skin is warm and dry.  Psychiatric:  Consented sister Kendal Hymen via phone  Nursing note and vitals reviewed.   Mallampati Score:  MD Evaluation Airway: Other (comments) Airway comments: vent---trach today Heart: WNL Abdomen: WNL Chest/ Lungs: WNL ASA  Classification: 3 Mallampati/Airway Score: Three  Imaging: Dg Chest 2 View  04/01/2015  CLINICAL DATA:  Shortness of breath, confusion EXAM: CHEST  2 VIEW COMPARISON:  03/31/2015 FINDINGS: Cardiomegaly again noted. Central mild vascular congestion and mild perihilar interstitial prominence probable residual mild interstitial edema. Left IJ central line has been removed. Dual lumen right IJ catheter again noted. Again noted chronic elevation of the right hemidiaphragm with right basilar atelectasis. Persistent streaky atelectasis or infiltrate left base retrocardiac best seen on lateral view. IMPRESSION: Central mild vascular congestion and mild perihilar interstitial prominence probable residual mild interstitial edema. Left IJ central line has been removed. Dual lumen right IJ catheter again noted. Again noted chronic elevation of the right hemidiaphragm with right basilar  atelectasis. Persistent streaky atelectasis or infiltrate left base retrocardiac best seen on lateral view. Electronically Signed   By: Natasha Mead M.D.   On: 04/01/2015 10:58   Ct Soft Tissue Neck W Contrast  04/02/2015  CLINICAL DATA:  Acute respiratory failure. Cardiac arrest. End-stage renal disease. Difficult intubation. Large epiglottis is reported. EXAM: CT NECK WITH CONTRAST TECHNIQUE: Multidetector CT imaging of the neck was performed using the standard protocol following the bolus administration of intravenous contrast. CONTRAST:  80mL OMNIPAQUE IOHEXOL 300 MG/ML  SOLN COMPARISON:  None. FINDINGS: An endotracheal tube and an orogastric tube have been placed. The endotracheal tube appears to be in good position. The orogastric tube is in the esophagus. The pharyngeal soft tissues are diffusely swollen. There is no parapharyngeal inflammatory process, but the presence or absence of epiglottitis or mucosal lesion is not established. Difficult to confirm or exclude tonsillar or adenoidal enlargement. The endotracheal tube crosses the larynx, and  there is no visible laryngeal abnormality. No pathologic adenopathy is observed. BILATERAL internal jugular catheters are noted, likely a dialysis catheter on the RIGHT and a central venous line on the LEFT. No pneumothorax is evident. CT chest reported separately. Layering fluid is seen in the sphenoid sinus and LEFT maxillary sinus. No visible orbital abnormality. Major and minor salivary glands appear unremarkable. Cervical spondylosis but no osseous finding of acuity. IMPRESSION: Post intubation findings as described. The pharyngeal soft tissues are diffusely swollen but this could reflect recent intubation; there is no visible discrete inflammatory process in the pharynx or in the parapharyngeal space. Electronically Signed   By: Elsie Stain M.D.   On: 04/02/2015 17:10   Ct Chest W Contrast  04/02/2015  CLINICAL DATA:  Acute respiratory failure. Question  pneumonia. History of end-stage renal disease. EXAM: CT CHEST WITH CONTRAST TECHNIQUE: Multidetector CT imaging of the chest was performed during intravenous contrast administration. CONTRAST:  80 mL OMNIPAQUE IOHEXOL 300 MG/ML  SOLN COMPARISON:  Multiple prior plain films of the chest, last 03/31/2015, 04/01/2015 and 04/02/2015 FINDINGS: Endotracheal tube and right IJ catheter are seen and in good position. NG tube is also in place coursing into the stomach. The tip is below the inferior margin of the exam. There is cardiomegaly. No pericardial pleural effusion. No axillary, hilar or mediastinal lymphadenopathy. The lungs demonstrate dependent airspace disease bilaterally, worse on the right. A few air bronchograms are seen in the lower lobes bilaterally. The lungs are otherwise unremarkable. Incidentally imaged upper abdomen shows a tiny stone within the gallbladder. Visualized intra-abdominal contents are otherwise unremarkable. IMPRESSION: Right worse than left dependent airspace disease has an appearance most suggestive of atelectasis rather than pneumonia. Marked cardiomegaly. Gallstones without evidence of cholecystitis. Electronically Signed   By: Drusilla Kanner M.D.   On: 04/02/2015 17:09   Dg Chest Port 1 View  04/10/2015  CLINICAL DATA:  Respiratory failure. History of congestive heart failure and hypertension. EXAM: PORTABLE CHEST 1 VIEW COMPARISON:  04/08/2015. FINDINGS: 0612 hours. The endotracheal tube, left IJ central venous catheter, right IJ dialysis catheter and nasogastric tube appear unchanged. There are persistent low lung volumes with bibasilar atelectasis. No edema, pneumothorax or significant pleural effusion identified. The heart size and mediastinal contours are stable. IMPRESSION: Stable support system and lines. Persistent bibasilar atelectasis without definite edema. Electronically Signed   By: Carey Bullocks M.D.   On: 04/10/2015 08:16   Dg Chest Port 1 View  04/08/2015   CLINICAL DATA:  Intubated, CHF EXAM: PORTABLE CHEST 1 VIEW COMPARISON:  Chest radiograph from earlier today. FINDINGS: Endotracheal tube tip is 3.2 cm above the carina. Enteric tube enters the stomach, with the tip not seen on this image. Right internal jugular central venous catheter terminates in the lower third of the superior vena cava. Left internal jugular central venous catheter terminates in the upper third of the superior vena cava. Stable cardiomediastinal silhouette with mild cardiomegaly. No pneumothorax. No pleural effusion. Low lung volumes. Worsening mild pulmonary edema. Patchy consolidation at both lung bases, left greater than right, not appreciably changed. IMPRESSION: 1. Well-positioned lines and tubes. 2. Worsening mild congestive heart failure. 3. Low lung volumes. Patchy bibasilar lung consolidation, left greater than right, favor atelectasis, cannot exclude a component of pneumonia. Electronically Signed   By: Delbert Phenix M.D.   On: 04/08/2015 15:55   Dg Chest Port 1 View  04/08/2015  CLINICAL DATA:  Respiratory failure. EXAM: PORTABLE CHEST 1 VIEW COMPARISON:  04/07/2015 . FINDINGS: Endotracheal  tube, NG tube, dual-lumen right IJ catheter, left IJ line stable position. Cardiomegaly. Progressive bibasilar pulmonary atelectasis and/or infiltrates again noted. No prominent pleural effusion. No pneumothorax. IMPRESSION: 1. Lines and tubes in stable position. 2. Progressive bibasilar atelectasis and/or infiltrates. 3. Stable cardiomegaly. Electronically Signed   By: Maisie Fus  Register   On: 04/08/2015 07:27   Dg Chest Port 1 View  04/07/2015  CLINICAL DATA:  Hypoxia EXAM: PORTABLE CHEST 1 VIEW COMPARISON:  Study obtained earlier in the day FINDINGS: Endotracheal tube tip is 6.7 cm above the carina. The left jugular catheter tip is in the left innominate vein. The right subclavian catheter tip is in the superior vena cava near the cavoatrial junction. No pneumothorax. Nasogastric tube tip and  side port are below the diaphragm. There is atelectasis in the lung bases, more on the left than on the right, stable. Heart is prominent with pulmonary vascularity normal, stable. No adenopathy. IMPRESSION: Left jugular catheter tip is currently in the left innominate vein. Other tubes and catheters as described. No pneumothorax. Persistent atelectasis in lung bases, more on the left on the right, stable. No change in cardiac silhouette. Electronically Signed   By: Bretta Bang III M.D.   On: 04/07/2015 19:11   Dg Chest Port 1 View  04/07/2015  CLINICAL DATA:  Endotracheal tube EXAM: PORTABLE CHEST 1 VIEW COMPARISON:  Yesterday FINDINGS: Low volumes with bibasilar atelectasis based on chest CT 04/02/2015. Opacity has increased, especially on the right where there is flat morphology. Marked cardiopericardial enlargement without pericardial effusion on comparison CT. Endotracheal tube in good position with tip at the clavicular heads. The orogastric tube continues to the stomach at least. Bilateral IJ catheters, tips in stable unremarkable position. No effusion or pneumothorax. Pulmonary venous congestion. IMPRESSION: 1. Stable positioning of tubes and lines. 2. Worsening bibasilar opacity, atelectasis based on recent chest CT. 3. Low volumes and pulmonary venous congestion. Electronically Signed   By: Marnee Spring M.D.   On: 04/07/2015 07:46   Dg Chest Port 1 View  04/06/2015  CLINICAL DATA:  55 year old male with respiratory failure EXAM: PORTABLE CHEST 1 VIEW COMPARISON:  Radiograph dated 04/05/2015 FINDINGS: Endotracheal tube above the carina enteric tube coursing towards upper abdomen. Right-sided dialysis catheter and left IJ central line in stable positioning. There is stable cardiomegaly. Low lung volumes. Focal density at the right lung base similar to prior study and may represent atelectasis versus pneumonia. Left lung base opacity appears stable and may represent atelectasis/pneumonia. A  small left pleural effusion may be present. No pneumothorax. IMPRESSION: No significant interval change. Electronically Signed   By: Elgie Collard M.D.   On: 04/06/2015 04:34   Dg Chest Port 1 View  04/05/2015  CLINICAL DATA:  Acute respiratory failure EXAM: PORTABLE CHEST 1 VIEW COMPARISON:  04/04/2015 FINDINGS: Endotracheal tube, LEFT central venous line and RIGHT central venous line unchanged. Stable enlarged cardiac silhouette. Bibasilar atelectasis again demonstrated. Slight increase in RIGHT lower lobe atelectasis. No overt pulmonary edema. No pneumothorax. IMPRESSION: 1. Stable support apparatus. 2. Slight increase in RIGHT basilar atelectasis. 3. Dense LEFT basilar atelectasis unchanged. Electronically Signed   By: Genevive Bi M.D.   On: 04/05/2015 09:39   Dg Chest Port 1 View  04/04/2015  CLINICAL DATA:  Followup exam. End-stage renal disease with respiratory failure. Intubated patient. EXAM: PORTABLE CHEST 1 VIEW COMPARISON:  04/03/2015 FINDINGS: Lung volumes remain low. Elevation of the right hemidiaphragm is stable. Mild basilar atelectasis. The hazy opacity noted in the lungs on  the prior study has resolved consistent with resolved edema. Cardiac silhouette mildly enlarged.  No mediastinal or hilar masses. Endotracheal tube, nasogastric tube, left internal jugular central venous line and right internal jugular tunneled dual lumen central venous catheter are all stable from prior study. IMPRESSION: 1. Improved lung aeration. Pulmonary edema has essentially resolved. There is mild residual basilar atelectasis. 2. No new abnormalities. 3. Support apparatus is stable and well positioned. Electronically Signed   By: Amie Portland M.D.   On: 04/04/2015 10:41   Dg Chest Port 1 View  04/03/2015  CLINICAL DATA:  Obstructive sleep apnea, respiratory failure, acute encephalopathy, end-stage renal disease. EXAM: PORTABLE CHEST 1 VIEW COMPARISON:  Portable chest x-ray and chest CT scan of 2017.  FINDINGS: The lungs remain hypoinflated. Confluent alveolar opacities persist bilaterally. The cardiac silhouette remains enlarged. The pulmonary vascularity remains engorged and indistinct. The endotracheal tube tip lies 3.9 cm above the carina. The esophagogastric tube tip projects below the inferior margin of the image. The dialysis catheter on the right has its tip projecting over the distal third of the SVC. The left internal jugular venous catheter tip projects over the middle third of the SVC. IMPRESSION: Congestive heart failure with pulmonary interstitial and alveolar edema. Mild stable atelectasis at both lung bases. Electronically Signed   By: David  [place] M.D.   On: 04/03/2015 07:57   Dg Chest Port 1 View  04/02/2015  CLINICAL DATA:  Assess endotracheal tube and central line placement. EXAM: PORTABLE CHEST 1 VIEW COMPARISON:  Radiographs today at 1016 hour FINDINGS: The endotracheal tube appears low, carina difficult to visualize, however likely less than 1 cm from the carina. Recommend retraction of 1-2 cm. Tip of the left central line in the region of the proximal SVC. Right-sided dialysis catheter remains in place. Cardiomegaly is again seen. Vascular congestion probable perihilar edema, with air bronchograms on the left. Elevation of right hemidiaphragm with adjacent right basilar atelectasis again seen. There is no pneumothorax. IMPRESSION: 1. Endotracheal tube appears low, retraction of 1-2 cm recommended. 2. Tip of the left central line in the region of the proximal SVC. No pneumothorax. 3. Cardiomegaly with vascular congestion and perihilar edema, similar allowing for differences in technique. These results were called by telephone at the time of interpretation on 04/02/2015 at 2:10 am to nurse Rocco Pauls, who verbally acknowledged these results. Electronically Signed   By: Rubye Oaks M.D.   On: 04/02/2015 02:11   Dg Chest Port 1 View  03/31/2015  CLINICAL DATA:  Acute respiratory  failure EXAM: PORTABLE CHEST 1 VIEW COMPARISON:  One day prior FINDINGS: Right-sided dialysis catheter is unchanged. A left internal jugular line terminates at the low SVC, also similar. Extubation. Removal of nasogastric tube. Mildly degraded exam due to AP portable technique and patient body habitus. Midline trachea. Cardiomegaly accentuated by AP portable technique. moderate right hemidiaphragm elevation. Cannot exclude small bilateral pleural effusions. No pneumothorax. low low lung volumes with resultant prominence of the pulmonary interstitium. Right base atelectasis. Left base poorly evaluated. IMPRESSION: Multifactorial degradation, including further diminished lung volumes. Extubation with worsened bibasilar aeration, favoring atelectasis. cardiomegaly with pulmonary interstitial prominence, primarily felt to be due to low lung volumes. Mild interstitial edema cannot be excluded. Electronically Signed   By: Jeronimo Greaves M.D.   On: 03/31/2015 07:40   Dg Chest Port 1 View  03/30/2015  CLINICAL DATA:  Shortness of breath after dialysis. EXAM: PORTABLE CHEST 1 VIEW COMPARISON:  03/29/2015 FINDINGS: The heart is enlarged but  stable. Persistent central vascular congestion and streaky areas of atelectasis but no overt pulmonary edema. No definite pleural effusions. The support apparatus is stable. IMPRESSION: Stable support apparatus. Stable cardiac enlargement, vascular congestion and streaky areas of atelectasis but no overt pulmonary edema or definite pleural effusions. Electronically Signed   By: Rudie Meyer M.D.   On: 03/30/2015 08:05   Dg Chest Portable 1 View  03/29/2015  CLINICAL DATA:  Shortness of breath, intubation EXAM: PORTABLE CHEST 1 VIEW COMPARISON:  03/29/2015 FINDINGS: Cardiac enlargement unchanged. Limited inspiratory effect. Bilateral lower lung zone opacity similar to prior study. Endotracheal tube tip 19 mm above the carina. Orogastric tube extends off the inferior edge of the film.  Right central line stable. IMPRESSION: Lines and tubes as described above. Electronically Signed   By: Esperanza Heir M.D.   On: 03/29/2015 14:46   Dg Chest Port 1 View  03/29/2015  CLINICAL DATA:  Decreased oxygen saturation today. EXAM: PORTABLE CHEST 1 VIEW COMPARISON:  Single view of the chest 03/26/2015 and 03/25/2015. FINDINGS: Left IJ catheter has been removed since the most recent examination. Right IJ approach dialysis catheter remains in place. There is cardiomegaly without edema. Subsegmental atelectasis is seen in the right lung base. No pneumothorax or pleural effusion. IMPRESSION: No change in subsegmental atelectasis right lung base. Cardiomegaly without edema. Electronically Signed   By: Drusilla Kanner M.D.   On: 03/29/2015 13:19   Dg Chest Port 1 View  03/26/2015  CLINICAL DATA:  Respiratory failure. EXAM: PORTABLE CHEST 1 VIEW COMPARISON:  03/25/2015.  01/22/2015. FINDINGS: Interim extubation and removal of NG tube. Dialysis catheter left IJ line stable position. Cardiomegaly with pulmonary vascular prominence and mild interstitial prominence suggesting mild congestive heart failure. Small left pleural effusion cannot be excluded. Low lung volumes with basilar atelectasis. No pneumothorax. Nodular density is noted over the right supra clavicular soft tissues. I IMPRESSION: 1. Interim extubation and removal of NG tube. Dialysis catheter and left IJ line in stable position. 2. Cardiomegaly with pulmonary vascular prominence and bilateral interstitial prominence suggesting congestive heart failure. Small left pleural effusion cannot be excluded. Low lung volumes with basilar atelectasis . 3. Soft tissue nodular density is noted over the right supraclavicular region. Clinical correlation suggested. Electronically Signed   By: Maisie Fus  Register   On: 03/26/2015 07:42   Dg Chest Port 1 View  03/25/2015  CLINICAL DATA:  Acute respiratory failure. EXAM: PORTABLE CHEST 1 VIEW COMPARISON:   03/24/2015. FINDINGS: ET tube 4.5 cm above carina. Unchanged central venous line, double lumen dialysis catheter, and enteric tube. Cardiomegaly. Decreased lung volumes with moderate edema but has worsened from yesterday's examination. No pneumothorax. IMPRESSION: Worsening aeration. Decreased lung volumes with moderate edema. Tubes and lines are stable. Electronically Signed   By: Elsie Stain M.D.   On: 03/25/2015 07:34   Dg Chest Port 1 View  03/24/2015  CLINICAL DATA:  Central line placement EXAM: PORTABLE CHEST 1 VIEW COMPARISON:  03/24/2015 FINDINGS: A left jugular central venous catheter has been placed. The tip is in the upper SVC. No ensuing pneumothorax. Endotracheal tube, NG tube, and right jugular dialysis catheter are stable. Cardiomegaly is unchanged. Low lung volumes have improved. No consolidation. IMPRESSION: New left jugular central venous catheter with its tip in the upper SVC and no pneumothorax. Improved lung volumes. Electronically Signed   By: Jolaine Click M.D.   On: 03/24/2015 19:53   Dg Chest Portable 1 View  03/24/2015  CLINICAL DATA:  Patient unresponsive.  Severe  respiratory distress. EXAM: PORTABLE CHEST 1 VIEW COMPARISON:  Portable film earlier in the day. FINDINGS: ET tube has been inserted, and lies 3.8 cm above carina. Dual lumen catheter from RIGHT IJ approach unchanged, with tips in the RIGHT atrium. Cardiomegaly. Worsening pulmonary edema. Low lung volumes. Enteric tube placed in the stomach. IMPRESSION: ET tube good position, 3.8 cm above carina. ET tube good position 3.8 cm above carina. Worsening aeration, now with pulmonary edema. Electronically Signed   By: Elsie Stain M.D.   On: 03/24/2015 15:08   Dg Chest Portable 1 View  03/24/2015  CLINICAL DATA:  Shortness of Breath.  Chronic renal failure. EXAM: PORTABLE CHEST 1 VIEW COMPARISON:  February 23, 2015 FINDINGS: There is persistent elevation of the right hemidiaphragm. There is no edema or consolidation. There is  mild scarring in the left base. There is mild cardiomegaly with pulmonary vascularity within normal limits. Dual-lumen catheter with tips in superior vena cava. No pneumothorax. IMPRESSION: Central catheter as described without pneumothorax. No edema or consolidation. Slight scarring left base. Stable elevation right hemidiaphragm. Stable cardiac prominence. Electronically Signed   By: Bretta Bang III M.D.   On: 03/24/2015 13:20   Dg Chest Port 1v Same Day  03/29/2015  CLINICAL DATA:  Central line placement. EXAM: PORTABLE CHEST 1 VIEW COMPARISON:  03/29/2015 FINDINGS: There has been interval placement of left internal jugular approach central venous catheter with tip terminating at the expected location of superior vena cava. Large dual lumen right internal jugular central venous catheter, and endotracheal tube are stable. Enteric catheter is partially visualized. The cardiac silhouette is enlarged. Mediastinal contours appear intact. There is no evidence of pneumothorax. Lung volumes are low with prominent interstitial markings. Osseous structures are without acute abnormality. Soft tissues are grossly normal. IMPRESSION: Status post placement of left internal jugular approach central venous catheter. No evidence of pneumothorax. Otherwise stable supporting apparatus. Low lung volumes with pulmonary vascular congestion. Enlarged cardiac silhouette. Electronically Signed   By: Ted Mcalpine M.D.   On: 03/29/2015 15:31   Dg Abd Portable 1v  03/29/2015  CLINICAL DATA:  55 year old male status post OG tube placement EXAM: PORTABLE ABDOMEN - 1 VIEW COMPARISON:  Concurrently obtained chest x-ray FINDINGS: The tip of the orogastric tube projects over the region of the gastric fundus. The bowel gas pattern is not obstructed. No acute osseous abnormality. IMPRESSION: The tip of the orogastric tube projects over the gastric fundus in acceptable position. Electronically Signed   By: Malachy Moan M.D.    On: 03/29/2015 14:49    Labs:  CBC:  Recent Labs  04/05/15 0426  04/07/15 0430  04/08/15 0415  04/10/15 0436 04/10/15 1610 04/10/15 0616 04/10/15 0755 04/10/15 0801  WBC 8.4  --  10.3  --  10.7*  --  11.0*  --   --   --   --   HGB 9.9*  < > 9.8*  < > 10.3*  < > 9.1* 12.9* 10.5* 8.8* 11.9*  HCT 32.4*  < > 35.1*  < > 35.4*  < > 29.9* 38.0* 31.0* 26.0* 35.0*  PLT 137*  --  125*  --  86*  --  69*  --   --   --   --   < > = values in this interval not displayed.  COAGS:  Recent Labs  05/19/14 1239 05/20/14 0640 05/21/14 0620 03/29/15 1745 04/03/15 0440 04/04/15 0401 04/05/15 0426  INR 1.11 1.15 1.11 1.23  --   --   --  APTT  --   --   --  26 42* 35 140*    BMP:  Recent Labs  04/08/15 1606  04/09/15 0431  04/09/15 1600  04/10/15 0436 04/10/15 0611 04/10/15 0616 04/10/15 0755 04/10/15 0801  NA 143  < > 144  < > 140  < > 140 146* 146* 125* 146*  K 3.4*  < > 3.6  < > 3.7  < > 3.1* 2.9* 2.5* 6.4* 2.9*  CL 114*  < > 116*  < > 115*  < > 107 111 113* 126* 112*  CO2 17*  --  17*  --  16*  --  18*  --   --   --   --   GLUCOSE 102*  < > 123*  < > 112*  < > 92 133* 82 107* 143*  BUN 41*  < > 41*  < > 36*  < > 40* 29* 33* 52* 27*  CALCIUM 8.7*  --  8.2*  --  8.4*  --  9.1  --   --   --   --   CREATININE 3.61*  < > 3.33*  < > 3.03*  < > 3.15* 2.10* 2.30* 2.80* 1.90*  GFRNONAA 18*  --  19*  --  22*  --  21*  --   --   --   --   GFRAA 20*  --  22*  --  25*  --  24*  --   --   --   --   < > = values in this interval not displayed.  LIVER FUNCTION TESTS:  Recent Labs  02/02/15 0035  02/23/15 1949 02/24/15 1409  03/29/15 1205  04/08/15 1606 04/09/15 0431 04/09/15 1600 04/10/15 0436  BILITOT 0.5  --  0.6 0.6  --  0.7  --   --   --   --   --   AST 16  --  19 17  --  22  --   --   --   --   --   ALT 12*  --  17 15*  --  15*  --   --   --   --   --   ALKPHOS 65  --  65 61  --  52  --   --   --   --   --   PROT 7.2  --  7.5 6.7  --  7.5  --   --   --   --   --    ALBUMIN 3.8  < > 3.5 3.2*  < > 4.0  < > 3.1* 2.9* 3.0* 2.7*  < > = values in this interval not displayed.  TUMOR MARKERS: No results for input(s): AFPTM, CEA, CA199, CHROMGRNA in the last 8760 hours.  Assessment and Plan:  COPD; Resp failure On vent---for trach today No response Need for long term care Malnutrition Scheduled for percutaneous gastric tube placement in IR 1/23 Risks and Benefits discussed with the patient's sister including, but not limited to the need for a barium enema during the procedure, bleeding, infection, peritonitis, or damage to adjacent structures. All of the her questions were answered, she is agreeable to proceed. Consent signed and in chart.   Thank you for this interesting consult.  I greatly enjoyed meeting KORY RAINS and look forward to participating in their care.  A copy of this report was sent to the requesting provider on this date.  Electronically Signed: Ralene Muskrat  A 04/10/2015, 10:00 AM   I spent a total of 40 Minutes    in face to face in clinical consultation, greater than 50% of which was counseling/coordinating care for percutaneous gastric tube placement

## 2015-04-10 NOTE — Progress Notes (Signed)
Assessment: 1 A/C resp failure -. Upper airway issues, OHS +/- bibasilar PNA vs atx by CT. On vent, per CCM. For trach on Friday 2 ESRD-Met Acidosis, Hypernatremia, hypokalemia---normally TTS at [facility] via [facility]- on CRRT & citrate now. Receiving free water with hypotonic bicarb. Will cont to keep even, K supp (Outpt Dialysis Orders: [facility] 4.5h 135g 2/3.5 bath R IJ cath Hep)   3 Shock - on pressors 4 Bivent HF LVEF 30-35%, RV dysfxn w pulm htn 5 Left antecubital wound - seen by [facility] 12/15 - has intermittently been on Vanc with wet to dry dressings  Subjective: Interval History: For trach and ? PEG  Objective: Vital signs in last 24 hours: Temp:  [97.4 F (36.3 C)-98.3 F (36.8 C)] 97.8 F (36.6 C) (01/20 0800) Pulse Rate:  [60-92] 63 (01/20 0900) Resp:  [20-31] 30 (01/20 0900) BP: (84-126)/(55-78) 91/58 mmHg (01/20 0900) SpO2:  [100 %] 100 % (01/20 0900) FiO2 (%):  [40 %] 40 % (01/20 0856) Weight:  [132.6 kg (292 lb 5.3 oz)] 132.6 kg (292 lb 5.3 oz) (01/20 0209) Weight change: -0.5 kg (-1 lb 1.6 oz)  Intake/Output from previous day: 01/19 0701 - 01/20 0700 In: 7100.1 [I.V.:5610.1; NG/GT:1440; IV Piggyback:50] Out: 6108  Intake/Output this shift: Total I/O In: 618.8 [I.V.:468.8; IV Piggyback:150] Out: 483 [Other:483]  General appearance: sedated Resp: clear to auscultation bilaterally Chest wall: no tenderness Cardio: regular rate and rhythm, S1, S2 normal, no murmur, click, rub or gallop  Lab Results:  Recent Labs  04/08/15 0415  04/10/15 0436  04/10/15 0755 04/10/15 0801  WBC 10.7*  --  11.0*  --   --   --   HGB 10.3*  < > 9.1*  < > 8.8* 11.9*  HCT 35.4*  < > 29.9*  < > 26.0* 35.0*  PLT 86*  --  69*  --   --   --   < > = values in this interval not displayed. BMET:  Recent Labs  04/09/15 1600  04/10/15 0436  04/10/15 0755 04/10/15 0801  NA 140  < > 140  < > 125* 146*  K 3.7  < > 3.1*  < > 6.4* 2.9*  CL 115*  < > 107  < > 126* 112*  CO2 16*  --   18*  --   --   --   GLUCOSE 112*  < > 92  < > 107* 143*  BUN 36*  < > 40*  < > 52* 27*  CREATININE 3.03*  < > 3.15*  < > 2.80* 1.90*  CALCIUM 8.4*  --  9.1  --   --   --   < > = values in this interval not displayed. No results for input(s): PTH in the last 72 hours. Iron Studies: No results for input(s): IRON, TIBC, TRANSFERRIN, FERRITIN in the last 72 hours. Studies/Results: Dg Chest Port 1 View  04/10/2015  CLINICAL DATA:  Respiratory failure. History of congestive heart failure and hypertension. EXAM: PORTABLE CHEST 1 VIEW COMPARISON:  04/08/2015. FINDINGS: 0612 hours. The endotracheal tube, left IJ central venous catheter, right IJ dialysis catheter and nasogastric tube appear unchanged. There are persistent low lung volumes with bibasilar atelectasis. No edema, pneumothorax or significant pleural effusion identified. The heart size and mediastinal contours are stable. IMPRESSION: Stable support system and lines. Persistent bibasilar atelectasis without definite edema. Electronically Signed   By: Richardean Sale M.D.   On: 04/10/2015 08:16   Dg Chest [facility]  04/08/2015  CLINICAL DATA:  Intubated, CHF EXAM: PORTABLE CHEST 1 VIEW COMPARISON:  Chest radiograph from earlier today. FINDINGS: Endotracheal tube tip is 3.2 cm above the carina. Enteric tube enters the stomach, with the tip not seen on this image. Right internal jugular central venous catheter terminates in the lower third of the superior vena cava. Left internal jugular central venous catheter terminates in the upper third of the superior vena cava. Stable cardiomediastinal silhouette with mild cardiomegaly. No pneumothorax. No pleural effusion. Low lung volumes. Worsening mild pulmonary edema. Patchy consolidation at both lung bases, left greater than right, not appreciably changed. IMPRESSION: 1. Well-positioned lines and tubes. 2. Worsening mild congestive heart failure. 3. Low lung volumes. Patchy bibasilar lung consolidation, left  greater than right, favor atelectasis, cannot exclude a component of pneumonia. Electronically Signed   By: Ilona Sorrel M.D.   On: 04/08/2015 15:55   Scheduled: . amitriptyline  25 mg Oral QHS  . antiseptic oral rinse  7 mL Mouth Rinse 10 times per day  . chlorhexidine gluconate  15 mL Mouth Rinse BID  . doxercalciferol  3 mcg Intravenous Q T,Th,Sat-1800  . feeding supplement (NEPRO CARB STEADY)  1,000 mL Per Tube Q24H  . feeding supplement (PRO-STAT SUGAR FREE 64)  60 mL Per Tube 5 X Daily  . free water  300 mL Per Tube Q6H  . hydrocortisone sod succinate (SOLU-CORTEF) inj  50 mg Intravenous Q12H  . multivitamin  1 tablet Oral QHS  . pantoprazole sodium  40 mg Per Tube QHS  . polyethylene glycol  17 g Oral Daily  . QUEtiapine  25 mg Per Tube Daily  . sennosides  5 mL Oral BID     LOS: 12 days   Dessie Delcarlo C 04/10/2015,9:56 AM

## 2015-04-10 NOTE — Progress Notes (Signed)
RT assisted with pt transport on vent from 2H14 to OR. Pt's vitals remained stable throughout.

## 2015-04-10 NOTE — Progress Notes (Signed)
RT assisted with pt transport on vent from OR 10 to 2H14. Vitals remained stable.

## 2015-04-10 NOTE — Transfer of Care (Signed)
Immediate Anesthesia Transfer of Care Note  Patient: Gary Rivera  Procedure(s) Performed: Procedure(s): TRACHEOSTOMY (N/A)  Patient Location: ICU  Anesthesia Type:General  Level of Consciousness: sedated  Airway & Oxygen Therapy: Patient placed on Ventilator (see vital sign flow sheet for setting) and via tracheostomy  Post-op Assessment: Report given to RN and Post -op Vital signs reviewed and stable  Post vital signs: Reviewed and stable  Last Vitals:  Filed Vitals:   04/10/15 0900 04/10/15 1000  BP: 91/58 107/67  Pulse: 63 58  Temp:    Resp: 30 30    Complications: No apparent anesthesia complications

## 2015-04-10 NOTE — Progress Notes (Signed)
PULMONARY / CRITICAL CARE MEDICINE   Name: Gary Rivera MRN: [id number] DOB: [birth year]    ADMISSION DATE:  03/29/2015   REFERRING MD:  ED  CHIEF COMPLAINT:  Resp failure  SUBJECTIVE:   No overnight events. For trach today. Worsening thrombocytopenia. Still no bowel movement.   VITAL SIGNS: BP 99/59 mmHg  Pulse 79  Temp(Src) 98.1 F (36.7 C) (Oral)  Resp 30  Ht 6\' 2"  (1.88 m)  Wt 292 lb 5.3 oz (132.6 kg)  BMI 37.52 kg/m2  SpO2 100%  HEMODYNAMICS: CVP:  [6 mmHg-19 mmHg] 19 mmHg  VENTILATOR SETTINGS: Vent Mode:  [-] PRVC FiO2 (%):  [40 %] 40 % Set Rate:  [30 bmp] 30 bmp Vt Set:  [600 mL] 600 mL PEEP:  [5 cmH20] 5 cmH20 Plateau Pressure:  [20 cmH20-25 cmH20] 25 cmH20  INTAKE / OUTPUT: I/O last 3 completed shifts: In: 9992.6 [I.V.:7402.6; NG/GT:2540; IV Piggyback:50] Out: 8920 [Other:8920]  PHYSICAL EXAMINATION: General: Acutely ill appearing male, intubated, lying in bed.  Neuro: RASS -1, moves all extremities spontaneously HEENT:  PERRL, moist mucus membranes. ETT in place. RIJ tunneled cath, LIJ CVL.  Cardiovascular:  RRR, no murmurs, gallops, or rubs.  Lungs: Air entry equal bilaterally, coarse, no rales or wheezes.  Abdomen: obese, soft, NT, mild distension, BS +  Musculoskeletal:  Intact. Skin: Intact, left antecubital wound noted  LABS:  BMET  Recent Labs Lab 04/09/15 0431  04/09/15 1600  04/10/15 0436 04/10/15 0611 04/10/15 0616  NA 144  < > 140  < > 140 146* 146*  K 3.6  < > 3.7  < > 3.1* 2.9* 2.5*  CL 116*  < > 115*  < > 107 111 113*  CO2 17*  --  16*  --  18*  --   --   BUN 41*  < > 36*  < > 40* 29* 33*  CREATININE 3.33*  < > 3.03*  < > 3.15* 2.10* 2.30*  GLUCOSE 123*  < > 112*  < > 92 133* 82  < > = values in this interval not displayed.   Electrolytes  Recent Labs Lab 04/08/15 0415  04/09/15 0431 04/09/15 1600 04/10/15 0436  CALCIUM 9.1  < > 8.2* 8.4* 9.1  MG 1.6*  --  1.5*  --  1.7  PHOS 3.5  < > 3.1 3.1 3.1  < > = values  in this interval not displayed.   CBC  Recent Labs Lab 04/07/15 0430  04/08/15 0415  04/10/15 0436 04/10/15 0611 04/10/15 0616  WBC 10.3  --  10.7*  --  11.0*  --   --   HGB 9.8*  < > 10.3*  < > 9.1* 12.9* 10.5*  HCT 35.1*  < > 35.4*  < > 29.9* 38.0* 31.0*  PLT 125*  --  86*  --  69*  --   --   < > = values in this interval not displayed.   Coag's  Recent Labs Lab 04/04/15 0401 04/05/15 0426  APTT 35 140*   Sepsis Markers  Recent Labs Lab 04/05/15 0426  PROCALCITON 1.02   ABG  Recent Labs Lab 04/07/15 0514 04/07/15 0615  PHART 7.158* 7.199*  PCO2ART 46.7* 27.2*  PO2ART 63.0* 137*   Liver Enzymes  Recent Labs Lab 04/09/15 0431 04/09/15 1600 04/10/15 0436  ALBUMIN 2.9* 3.0* 2.7*    Glucose  Recent Labs Lab 04/09/15 0425 04/09/15 0735 04/09/15 1208 04/09/15 1927 04/09/15 2347 04/10/15 0524  GLUCAP 108* 109* 109* 132*  162* 92    Imaging No results found.   STUDIES:  CT chest 1/12 >> Bibasilar atelectasis CT neck 1/12 >> Artifact with movement, basilar consolidation with air bronchograms, no mass  CULTURES: 1/8 Blood x 2 >> neg 1/8 Urine >> not done 1/12 Sputum >> Oral flora 1/11 RVP >> neg  ANTIBIOTICS: 1/8 vanc >> 1/12 1/8 zoysn >> 1/12  SIGNIFICANT EVENTS: 1/6  Discharge from [facility] following intubation 1/8  Admitted, intubated 1/9  Extubated 1/11  Re-intubated 1/13  Hypothermia on cvvhd 1/18  ETT changed d/t cuff leak.   LINES/TUBES: 1/8 ETT>>01/09>>>1/11>>> LIJ CVL 1/11 >>   DISCUSSION: 55 yo MO AAF non compliant with HD and CPAP and re-presents with hypercarbic and hypoxic resp failure  ASSESSMENT / PLAN:  PULMONARY A: VDRF/Hypercarbic respiratory failure OSA/OHS  Difficult Airway Bibasilar atelectasis Cuff leak; ETT changed 1/18 P:   Full vent support Wean PEEP / FiO2 for sats > 92% Daily SBT Trach scheduled for this AM with ENT Albuterol prn for wheezing Upright positioning as tolerated    CARDIOVASCULAR A:  HTN Pulmonary HTN MVR Continued Hypotension, most likely 2/2 sedation Chronic Combined CHF P:  Monitor CVP Neo gtt for MAP < 65 Hold antihypertensives Even balance per renal Stress dose steroids; hydrocortisone to 50 q12h  RENAL A:   ESRD on HD, non-compliant, RIJ tunneled cath Non-Anion Gap Metabolic Acidosis Hypomagnesemia Hypernatremia Hypokalemia P:   CRRT per renal, even balance Bicarb per renal Continue free water per tube 300 mL q6h Follow BMP Supp K, Mag  GASTROINTESTINAL A:   GI PPx Nutrition Constipation P:   Protonix Tube Feeds as tolerated  Senna bid + Miralax daily Consider enema after trach. No sorbitol given ESRD  HEMATOLOGIC A:   Anemia of chronic illness Thrombocytopenia; worsening. Unclear etiology DVT PPx P:  Repeat CBC in AM SCD's No heparin  INFECTIOUS A:   Left antecubital wound  Dependent Airspace Disease  P:   Repeat CBC in AM, follow fever curve Follow cultures  ENDOCRINE A:   No acute issues P:   Glucose on BMP  NEUROLOGIC A:   Metabolic Encephalopathy 2/2 hypercarbia Agitation P:   RASS goal: 0, -1 Continue home amitriptyline  Fentanyl gtt, attempt to turn off Versed gtt Continue Seroquel 25 mg daily Haldol prn Daily WUA    FAMILY  - Updates: Family and patient updated at bedside 1/19, okay to proceed with trach and PEG placement  Lauris Chroman, MD PGY-3, Internal Medicine Pager: [phone]  Attending Note:  55 year old morbidly obese male with ESRD on HD who has become less compliant with dialysis and presented in respiratory failure due to pulmonary edema.  Was very deconditioned, extubated and reintubated multiple times.  On exam, lungs remain very coarse.  Remains on pressors with any sort of sedation.  To be trached today, IR to place PEG next week.  Will need placement.  Hope is that with the tracheostomy in place will be able to use less sedation and subsequently get off pressors  and transition to regular HD.  Will re-evaluate post trach.  Hold in the ICU overnight.  The patient is critically ill with multiple organ systems failure and requires high complexity decision making for assessment and support, frequent evaluation and titration of therapies, application of advanced monitoring technologies and extensive interpretation of multiple databases.   Critical Care Time devoted to patient care services described in this note is  35  Minutes. This time reflects time of care of this  signee Dr Jennet Maduro. This critical care time does not reflect procedure time, or teaching time or supervisory time of PA/NP/Med student/Med Resident etc but could involve care discussion time.  Rush Farmer, M.D. [facility] Pulmonary/Critical Care Medicine. Pager: [phone]. After hours pager: [phone].

## 2015-04-10 NOTE — Anesthesia Preprocedure Evaluation (Addendum)
Anesthesia Evaluation  Patient identified by MRN, date of birth, ID band Patient unresponsive    Reviewed: Allergy & Precautions, Patient's Chart, lab work & pertinent test results  Airway Mallampati: Intubated  TM Distance: >3 FB Neck ROM: Full   Comment: Large neck, large amount of soft tissue Dental  (+) Poor Dentition   Pulmonary  Acute on chronic respiratory failure Remained intubated for several days       + intubated    Cardiovascular hypertension, +CHF   Rhythm:Regular Rate:Normal + Systolic murmurs Echo 03/26/15: EF is 35-40%, moderate RV systolic dysfunction    Neuro/Psych Anxiety negative neurological ROS     GI/Hepatic negative GI ROS, Neg liver ROS,   Endo/Other  Morbid obesity  Renal/GU Dialysis and ESRFRenal disease  negative genitourinary   Musculoskeletal negative musculoskeletal ROS (+) Arthritis ,   Abdominal   Peds negative pediatric ROS (+)  Hematology  (+) anemia ,   Anesthesia Other Findings Patient is intubated with hypercarbic respiratory failure, in need of tracheostomy  Anemia of chronic disease, thrombocytopenia  Reproductive/Obstetrics negative OB ROS                            Anesthesia Physical  Anesthesia Plan  ASA: IV  Anesthesia Plan: General   Post-op Pain Management:    Induction: Intravenous  Airway Management Planned: Tracheostomy  Additional Equipment:   Intra-op Plan:   Post-operative Plan: Post-operative intubation/ventilation  Informed Consent: I have reviewed the patients History and Physical, chart, labs and discussed the procedure including the risks, benefits and alternatives for the proposed anesthesia with the patient or authorized representative who has indicated his/her understanding and acceptance.   Dental advisory given  Plan Discussed with: CRNA and Surgeon  Anesthesia Plan Comments:         Anesthesia Quick  Evaluation

## 2015-04-11 ENCOUNTER — Inpatient Hospital Stay (HOSPITAL_COMMUNITY): Payer: Medicare Other

## 2015-04-11 DIAGNOSIS — Z930 Tracheostomy status: Secondary | ICD-10-CM

## 2015-04-11 DIAGNOSIS — J9620 Acute and chronic respiratory failure, unspecified whether with hypoxia or hypercapnia: Secondary | ICD-10-CM

## 2015-04-11 LAB — POCT I-STAT, CHEM 8
BUN: 22 mg/dL — AB (ref 6–20)
BUN: 22 mg/dL — AB (ref 6–20)
BUN: 23 mg/dL — AB (ref 6–20)
BUN: 23 mg/dL — AB (ref 6–20)
BUN: 23 mg/dL — ABNORMAL HIGH (ref 6–20)
BUN: 24 mg/dL — AB (ref 6–20)
BUN: 25 mg/dL — AB (ref 6–20)
BUN: 27 mg/dL — AB (ref 6–20)
BUN: 27 mg/dL — AB (ref 6–20)
BUN: 27 mg/dL — ABNORMAL HIGH (ref 6–20)
BUN: 28 mg/dL — AB (ref 6–20)
BUN: 28 mg/dL — AB (ref 6–20)
BUN: 28 mg/dL — AB (ref 6–20)
BUN: 28 mg/dL — ABNORMAL HIGH (ref 6–20)
BUN: 29 mg/dL — AB (ref 6–20)
BUN: 47 mg/dL — ABNORMAL HIGH (ref 6–20)
CALCIUM ION: 0.38 mmol/L — AB (ref 1.12–1.23)
CALCIUM ION: 0.39 mmol/L — AB (ref 1.12–1.23)
CALCIUM ION: 0.98 mmol/L — AB (ref 1.12–1.23)
CALCIUM ION: 0.98 mmol/L — AB (ref 1.12–1.23)
CALCIUM ION: 0.98 mmol/L — AB (ref 1.12–1.23)
CALCIUM ION: 1.07 mmol/L — AB (ref 1.12–1.23)
CALCIUM ION: 1.14 mmol/L (ref 1.12–1.23)
CHLORIDE: 109 mmol/L (ref 101–111)
CHLORIDE: 109 mmol/L (ref 101–111)
CHLORIDE: 110 mmol/L (ref 101–111)
CHLORIDE: 110 mmol/L (ref 101–111)
CHLORIDE: 112 mmol/L — AB (ref 101–111)
CHLORIDE: 113 mmol/L — AB (ref 101–111)
CREATININE: 1.8 mg/dL — AB (ref 0.61–1.24)
CREATININE: 1.8 mg/dL — AB (ref 0.61–1.24)
CREATININE: 1.8 mg/dL — AB (ref 0.61–1.24)
CREATININE: 1.8 mg/dL — AB (ref 0.61–1.24)
CREATININE: 2.2 mg/dL — AB (ref 0.61–1.24)
CREATININE: 2.4 mg/dL — AB (ref 0.61–1.24)
CREATININE: 2.5 mg/dL — AB (ref 0.61–1.24)
CREATININE: 2.5 mg/dL — AB (ref 0.61–1.24)
CREATININE: 2.5 mg/dL — AB (ref 0.61–1.24)
CREATININE: 2.5 mg/dL — AB (ref 0.61–1.24)
CREATININE: 2.5 mg/dL — AB (ref 0.61–1.24)
Calcium, Ion: 0.39 mmol/L — CL (ref 1.12–1.23)
Calcium, Ion: 0.4 mmol/L — CL (ref 1.12–1.23)
Calcium, Ion: 0.4 mmol/L — CL (ref 1.12–1.23)
Calcium, Ion: 0.41 mmol/L — CL (ref 1.12–1.23)
Calcium, Ion: 0.48 mmol/L — CL (ref 1.12–1.23)
Calcium, Ion: 0.89 mmol/L — ABNORMAL LOW (ref 1.12–1.23)
Calcium, Ion: 1 mmol/L — ABNORMAL LOW (ref 1.12–1.23)
Calcium, Ion: 1.05 mmol/L — ABNORMAL LOW (ref 1.12–1.23)
Calcium, Ion: 1.06 mmol/L — ABNORMAL LOW (ref 1.12–1.23)
Chloride: 111 mmol/L (ref 101–111)
Chloride: 108 mmol/L (ref 101–111)
Chloride: 112 mmol/L — ABNORMAL HIGH (ref 101–111)
Chloride: 109 mmol/L (ref 101–111)
Chloride: 112 mmol/L — ABNORMAL HIGH (ref 101–111)
Chloride: 110 mmol/L (ref 101–111)
Chloride: 113 mmol/L — ABNORMAL HIGH (ref 101–111)
Chloride: 114 mmol/L — ABNORMAL HIGH (ref 101–111)
Chloride: 109 mmol/L (ref 101–111)
Chloride: 112 mmol/L — ABNORMAL HIGH (ref 101–111)
Creatinine, Ser: 1.8 mg/dL — ABNORMAL HIGH (ref 0.61–1.24)
Creatinine, Ser: 1.9 mg/dL — ABNORMAL HIGH (ref 0.61–1.24)
Creatinine, Ser: 2.5 mg/dL — ABNORMAL HIGH (ref 0.61–1.24)
Creatinine, Ser: 2.5 mg/dL — ABNORMAL HIGH (ref 0.61–1.24)
Creatinine, Ser: 2.6 mg/dL — ABNORMAL HIGH (ref 0.61–1.24)
GLUCOSE: 121 mg/dL — AB (ref 65–99)
GLUCOSE: 125 mg/dL — AB (ref 65–99)
GLUCOSE: 149 mg/dL — AB (ref 65–99)
GLUCOSE: 176 mg/dL — AB (ref 65–99)
GLUCOSE: 184 mg/dL — AB (ref 65–99)
Glucose, Bld: 174 mg/dL — ABNORMAL HIGH (ref 65–99)
Glucose, Bld: 140 mg/dL — ABNORMAL HIGH (ref 65–99)
Glucose, Bld: 133 mg/dL — ABNORMAL HIGH (ref 65–99)
Glucose, Bld: 89 mg/dL (ref 65–99)
Glucose, Bld: 91 mg/dL (ref 65–99)
Glucose, Bld: 100 mg/dL — ABNORMAL HIGH (ref 65–99)
Glucose, Bld: 147 mg/dL — ABNORMAL HIGH (ref 65–99)
Glucose, Bld: 164 mg/dL — ABNORMAL HIGH (ref 65–99)
Glucose, Bld: 113 mg/dL — ABNORMAL HIGH (ref 65–99)
Glucose, Bld: 123 mg/dL — ABNORMAL HIGH (ref 65–99)
Glucose, Bld: 174 mg/dL — ABNORMAL HIGH (ref 65–99)
HCT: 30 % — ABNORMAL LOW (ref 39.0–52.0)
HCT: 32 % — ABNORMAL LOW (ref 39.0–52.0)
HCT: 32 % — ABNORMAL LOW (ref 39.0–52.0)
HCT: 33 % — ABNORMAL LOW (ref 39.0–52.0)
HCT: 33 % — ABNORMAL LOW (ref 39.0–52.0)
HCT: 33 % — ABNORMAL LOW (ref 39.0–52.0)
HCT: 33 % — ABNORMAL LOW (ref 39.0–52.0)
HCT: 33 % — ABNORMAL LOW (ref 39.0–52.0)
HCT: 33 % — ABNORMAL LOW (ref 39.0–52.0)
HCT: 34 % — ABNORMAL LOW (ref 39.0–52.0)
HEMATOCRIT: 33 % — AB (ref 39.0–52.0)
HEMATOCRIT: 33 % — AB (ref 39.0–52.0)
HEMATOCRIT: 37 % — AB (ref 39.0–52.0)
HEMATOCRIT: 32 % — AB (ref 39.0–52.0)
HEMATOCRIT: 36 % — AB (ref 39.0–52.0)
HEMATOCRIT: 30 % — AB (ref 39.0–52.0)
HEMOGLOBIN: 11.6 g/dL — AB (ref 13.0–17.0)
HEMOGLOBIN: 11.2 g/dL — AB (ref 13.0–17.0)
HEMOGLOBIN: 11.2 g/dL — AB (ref 13.0–17.0)
HEMOGLOBIN: 12.6 g/dL — AB (ref 13.0–17.0)
HEMOGLOBIN: 11.2 g/dL — AB (ref 13.0–17.0)
Hemoglobin: 10.2 g/dL — ABNORMAL LOW (ref 13.0–17.0)
Hemoglobin: 10.2 g/dL — ABNORMAL LOW (ref 13.0–17.0)
Hemoglobin: 10.9 g/dL — ABNORMAL LOW (ref 13.0–17.0)
Hemoglobin: 10.9 g/dL — ABNORMAL LOW (ref 13.0–17.0)
Hemoglobin: 10.9 g/dL — ABNORMAL LOW (ref 13.0–17.0)
Hemoglobin: 11.2 g/dL — ABNORMAL LOW (ref 13.0–17.0)
Hemoglobin: 11.2 g/dL — ABNORMAL LOW (ref 13.0–17.0)
Hemoglobin: 11.2 g/dL — ABNORMAL LOW (ref 13.0–17.0)
Hemoglobin: 11.2 g/dL — ABNORMAL LOW (ref 13.0–17.0)
Hemoglobin: 11.2 g/dL — ABNORMAL LOW (ref 13.0–17.0)
Hemoglobin: 12.2 g/dL — ABNORMAL LOW (ref 13.0–17.0)
POTASSIUM: 2.9 mmol/L — AB (ref 3.5–5.1)
POTASSIUM: 3.1 mmol/L — AB (ref 3.5–5.1)
POTASSIUM: 3.1 mmol/L — AB (ref 3.5–5.1)
POTASSIUM: 3.6 mmol/L (ref 3.5–5.1)
POTASSIUM: 3.7 mmol/L (ref 3.5–5.1)
POTASSIUM: 3.9 mmol/L (ref 3.5–5.1)
POTASSIUM: 4.4 mmol/L (ref 3.5–5.1)
Potassium: 2.7 mmol/L — CL (ref 3.5–5.1)
Potassium: 2.8 mmol/L — ABNORMAL LOW (ref 3.5–5.1)
Potassium: 2.9 mmol/L — ABNORMAL LOW (ref 3.5–5.1)
Potassium: 3 mmol/L — ABNORMAL LOW (ref 3.5–5.1)
Potassium: 3 mmol/L — ABNORMAL LOW (ref 3.5–5.1)
Potassium: 3 mmol/L — ABNORMAL LOW (ref 3.5–5.1)
Potassium: 3.1 mmol/L — ABNORMAL LOW (ref 3.5–5.1)
Potassium: 3.6 mmol/L (ref 3.5–5.1)
Potassium: 7.3 mmol/L (ref 3.5–5.1)
SODIUM: 143 mmol/L (ref 135–145)
SODIUM: 144 mmol/L (ref 135–145)
SODIUM: 147 mmol/L — AB (ref 135–145)
Sodium: 139 mmol/L (ref 135–145)
Sodium: 142 mmol/L (ref 135–145)
Sodium: 143 mmol/L (ref 135–145)
Sodium: 143 mmol/L (ref 135–145)
Sodium: 143 mmol/L (ref 135–145)
Sodium: 144 mmol/L (ref 135–145)
Sodium: 144 mmol/L (ref 135–145)
Sodium: 146 mmol/L — ABNORMAL HIGH (ref 135–145)
Sodium: 146 mmol/L — ABNORMAL HIGH (ref 135–145)
Sodium: 146 mmol/L — ABNORMAL HIGH (ref 135–145)
Sodium: 147 mmol/L — ABNORMAL HIGH (ref 135–145)
Sodium: 147 mmol/L — ABNORMAL HIGH (ref 135–145)
Sodium: 147 mmol/L — ABNORMAL HIGH (ref 135–145)
TCO2: 12 mmol/L (ref 0–100)
TCO2: 12 mmol/L (ref 0–100)
TCO2: 13 mmol/L (ref 0–100)
TCO2: 13 mmol/L (ref 0–100)
TCO2: 13 mmol/L (ref 0–100)
TCO2: 14 mmol/L (ref 0–100)
TCO2: 15 mmol/L (ref 0–100)
TCO2: 16 mmol/L (ref 0–100)
TCO2: 17 mmol/L (ref 0–100)
TCO2: 17 mmol/L (ref 0–100)
TCO2: 18 mmol/L (ref 0–100)
TCO2: 18 mmol/L (ref 0–100)
TCO2: 18 mmol/L (ref 0–100)
TCO2: 19 mmol/L (ref 0–100)
TCO2: 19 mmol/L (ref 0–100)
TCO2: 19 mmol/L (ref 0–100)

## 2015-04-11 LAB — RENAL FUNCTION PANEL
ANION GAP: 12 (ref 5–15)
Albumin: 2.8 g/dL — ABNORMAL LOW (ref 3.5–5.0)
BUN: 28 mg/dL — ABNORMAL HIGH (ref 6–20)
CHLORIDE: 112 mmol/L — AB (ref 101–111)
CO2: 17 mmol/L — AB (ref 22–32)
Calcium: 8.1 mg/dL — ABNORMAL LOW (ref 8.9–10.3)
Creatinine, Ser: 2.64 mg/dL — ABNORMAL HIGH (ref 0.61–1.24)
GFR calc non Af Amer: 26 mL/min — ABNORMAL LOW (ref >60)
GFR, EST AFRICAN AMERICAN: 30 mL/min — AB (ref >60)
Glucose, Bld: 90 mg/dL (ref 65–99)
POTASSIUM: 3 mmol/L — AB (ref 3.5–5.1)
Phosphorus: 2.6 mg/dL (ref 2.5–4.6)
Sodium: 141 mmol/L (ref 135–145)

## 2015-04-11 LAB — CBC
HCT: 31.7 % — ABNORMAL LOW (ref 39.0–52.0)
Hemoglobin: 10.3 g/dL — ABNORMAL LOW (ref 13.0–17.0)
MCH: 30.3 pg (ref 26.0–34.0)
MCHC: 32.5 g/dL (ref 30.0–36.0)
MCV: 93.2 fL (ref 78.0–100.0)
PLATELETS: 76 10*3/uL — AB (ref 150–400)
RBC: 3.4 MIL/uL — AB (ref 4.22–5.81)
RDW: 19.7 % — ABNORMAL HIGH (ref 11.5–15.5)
WBC: 11.6 10*3/uL — ABNORMAL HIGH (ref 4.0–10.5)

## 2015-04-11 LAB — BLOOD GAS, ARTERIAL
ACID-BASE DEFICIT: 9.3 mmol/L — AB (ref 0.0–2.0)
BICARBONATE: 16.3 meq/L — AB (ref 20.0–24.0)
Drawn by: 252031
FIO2: 0.4
LHR: 30 {breaths}/min
O2 SAT: 92.2 %
PEEP: 5 cmH2O
PH ART: 7.283 — AB (ref 7.350–7.450)
Patient temperature: 97.6
TCO2: 17.4 mmol/L (ref 0–100)
VT: 600 mL
pCO2 arterial: 35.2 mmHg (ref 35.0–45.0)
pO2, Arterial: 73 mmHg — ABNORMAL LOW (ref 80.0–100.0)

## 2015-04-11 LAB — GLUCOSE, CAPILLARY
Glucose-Capillary: 100 mg/dL — ABNORMAL HIGH (ref 65–99)
Glucose-Capillary: 141 mg/dL — ABNORMAL HIGH (ref 65–99)
Glucose-Capillary: 91 mg/dL (ref 65–99)
Glucose-Capillary: 96 mg/dL (ref 65–99)

## 2015-04-11 LAB — CALCIUM, IONIZED: Calcium, Ionized, Serum: 4.6 mg/dL (ref 4.5–5.6)

## 2015-04-11 LAB — MAGNESIUM: Magnesium: 1.6 mg/dL — ABNORMAL LOW (ref 1.7–2.4)

## 2015-04-11 MED ORDER — HYDROMORPHONE HCL PF 10 MG/ML IJ SOLN
1.0000 mg/h | INTRAMUSCULAR | Status: DC
  Administered 2015-04-11: 1 mg/h via INTRAVENOUS
  Administered 2015-04-12 (×2): 4 mg/h via INTRAVENOUS
  Administered 2015-04-12: 1 mg/h via INTRAVENOUS
  Administered 2015-04-13: 4 mg/h via INTRAVENOUS
  Filled 2015-04-11 (×5): qty 5

## 2015-04-11 MED ORDER — POTASSIUM CHLORIDE 10 MEQ/50ML IV SOLN
10.0000 meq | INTRAVENOUS | Status: AC
  Administered 2015-04-11 (×5): 10 meq via INTRAVENOUS
  Filled 2015-04-11 (×5): qty 50

## 2015-04-11 MED ORDER — MAGNESIUM SULFATE 2 GM/50ML IV SOLN
2.0000 g | Freq: Once | INTRAVENOUS | Status: AC
  Administered 2015-04-11: 2 g via INTRAVENOUS
  Filled 2015-04-11: qty 50

## 2015-04-11 MED ORDER — HYDROMORPHONE HCL 1 MG/ML IJ SOLN
1.0000 mg | INTRAMUSCULAR | Status: DC | PRN
  Administered 2015-04-11 – 2015-04-12 (×2): 2 mg via INTRAVENOUS

## 2015-04-11 MED ORDER — QUETIAPINE FUMARATE 100 MG PO TABS
100.0000 mg | ORAL_TABLET | Freq: Every day | ORAL | Status: DC
  Administered 2015-04-12 – 2015-04-13 (×2): 100 mg
  Filled 2015-04-11 (×2): qty 1

## 2015-04-11 MED ORDER — DEXMEDETOMIDINE HCL IN NACL 400 MCG/100ML IV SOLN
0.4000 ug/kg/h | INTRAVENOUS | Status: DC
  Administered 2015-04-11: 0.6 ug/kg/h via INTRAVENOUS
  Administered 2015-04-11: 0.4 ug/kg/h via INTRAVENOUS
  Administered 2015-04-11: 1 ug/kg/h via INTRAVENOUS
  Administered 2015-04-12: 0.6 ug/kg/h via INTRAVENOUS
  Administered 2015-04-12 (×3): 1.2 ug/kg/h via INTRAVENOUS
  Administered 2015-04-12: 0.8 ug/kg/h via INTRAVENOUS
  Administered 2015-04-12: 1.2 ug/kg/h via INTRAVENOUS
  Administered 2015-04-12: 0.6 ug/kg/h via INTRAVENOUS
  Administered 2015-04-12 – 2015-04-13 (×2): 1.2 ug/kg/h via INTRAVENOUS
  Administered 2015-04-13: 0.8 ug/kg/h via INTRAVENOUS
  Administered 2015-04-13 (×5): 1.2 ug/kg/h via INTRAVENOUS
  Administered 2015-04-13: 0.6 ug/kg/h via INTRAVENOUS
  Administered 2015-04-14 (×2): 1.2 ug/kg/h via INTRAVENOUS
  Administered 2015-04-14: 1.8 ug/kg/h via INTRAVENOUS
  Administered 2015-04-14: 1.2 ug/kg/h via INTRAVENOUS
  Administered 2015-04-14: 1.8 ug/kg/h via INTRAVENOUS
  Administered 2015-04-14 – 2015-04-15 (×10): 1.2 ug/kg/h via INTRAVENOUS
  Filled 2015-04-11 (×13): qty 100
  Filled 2015-04-11: qty 50
  Filled 2015-04-11 (×2): qty 100
  Filled 2015-04-11: qty 200
  Filled 2015-04-11 (×2): qty 100
  Filled 2015-04-11: qty 200
  Filled 2015-04-11 (×3): qty 100
  Filled 2015-04-11 (×2): qty 200
  Filled 2015-04-11 (×2): qty 100
  Filled 2015-04-11: qty 200
  Filled 2015-04-11: qty 50
  Filled 2015-04-11: qty 100

## 2015-04-11 MED ORDER — POTASSIUM CHLORIDE 10 MEQ/50ML IV SOLN: 10.0000 meq | INTRAVENOUS | Status: DC

## 2015-04-11 MED ORDER — FLEET ENEMA 7-19 GM/118ML RE ENEM
1.0000 | ENEMA | Freq: Once | RECTAL | Status: AC
  Administered 2015-04-11: 1 via RECTAL
  Filled 2015-04-11: qty 1

## 2015-04-11 MED ORDER — HEPARIN SODIUM (PORCINE) 1000 UNIT/ML DIALYSIS
1000.0000 [IU] | INTRAMUSCULAR | Status: DC | PRN
  Administered 2015-04-11: 4600 [IU] via INTRAVENOUS_CENTRAL
  Filled 2015-04-11: qty 6
  Filled 2015-04-11: qty 2
  Filled 2015-04-11: qty 6
  Filled 2015-04-11: qty 3

## 2015-04-11 NOTE — Progress Notes (Signed)
eLink Physician-Brief Progress Note Patient Name: Gary Rivera DOB: [birth year] MRN: [id number]   Date of Service  04/11/2015  HPI/Events of Note  Hypokalemia  eICU Interventions  Potassium replaced     Intervention Category Major Interventions: Electrolyte abnormality - evaluation and management  DETERDING,ELIZABETH 04/11/2015, 12:55 AM

## 2015-04-11 NOTE — Progress Notes (Signed)
STAFF NOTE: I, Dr Lavinia Sharps have personally reviewed patient's available data, including medical history, events of note, physical examination and test results as part of my evaluation. I have discussed with resident/NP and other care providers such as pharmacist, RN and RRT.  In addition,  I personally evaluated patient and elicited key findings of   S: s/p trach - site healthy per RN and APP. Still on pressors. Having significant agitated encephalopathy and possible OIN - RN describes agitation on touching. APP added precedex and now he is on fent instead of . Renal holding CRRT/HD due to posslbe volume depletion  O:  obese Trach site looks ok RASS -2 on fent 200, versed and precedex Obese   Recent Labs Lab 04/08/15 0415  04/10/15 0436  04/11/15 0427  04/11/15 1005 04/11/15 1159 04/11/15 1211  HGB 10.3*  < > 9.1*  < > 10.3*  12.6*  < > 10.2* 11.2* 11.2*  HCT 35.4*  < > 29.9*  < > 31.7*  37.0*  < > 30.0* 33.0* 33.0*  WBC 10.7*  --  11.0*  --  11.6*  --   --   --   --   PLT 86*  --  69*  --  76*  --   --   --   --   < > = values in this interval not displayed.   A:   Acute on chronic resp failure - s/p trach ESRD - HD on hold per renal Circulatory shock -  On pressors THrombocytopenia Acute encephalopathy - ? Opioid related in setting of acute critical illness  P:  Rotate opioid fentanl to dilaudid coontinue percedex and versed Full vent support   .  Rest per NP/medical resident whose note is outlined above and that I agree with  The patient is critically ill with multiple organ systems failure and requires high complexity decision making for assessment and support, frequent evaluation and titration of therapies, application of advanced monitoring technologies and extensive interpretation of multiple databases.   Critical Care Time devoted to patient care services described in this note is  30  Minutes. This time reflects time of care of this signee Dr  Kalman Shan. This critical care time does not reflect procedure time, or teaching time or supervisory time of PA/NP/Med student/Med Resident etc but could involve care discussion time    Dr. Kalman Shan, M.D., [facility].C.P Pulmonary and Critical Care Medicine Staff Physician [facility] Pulmonary and Critical Care Pager: [phone], If no answer or between  15:00h - 7:00h: call [phone]  04/11/2015 3:43 PM

## 2015-04-11 NOTE — Consult Note (Signed)
[facility] Progress Note   Subjective: no changes  Filed Vitals:   04/11/15 1200 04/11/15 1205 04/11/15 1215 04/11/15 1230  BP:   103/61 92/63  Pulse: 90 88 83 80  Temp:      TempSrc:      Resp: 20 32 30 30  Height:      Weight:      SpO2: 96%  99% 99%    Inpatient medications: . amitriptyline  25 mg Oral QHS  . antiseptic oral rinse  7 mL Mouth Rinse 10 times per day  . chlorhexidine gluconate  15 mL Mouth Rinse BID  . doxercalciferol  3 mcg Intravenous Q T,Th,Sat-1800  . feeding supplement (NEPRO CARB STEADY)  1,000 mL Per Tube Q24H  . feeding supplement (PRO-STAT SUGAR FREE 64)  60 mL Per Tube 5 X Daily  . free water  300 mL Per Tube Q6H  . hydrocortisone sod succinate (SOLU-CORTEF) inj  50 mg Intravenous Q12H  . magnesium sulfate 1 - 4 g bolus IVPB  2 g Intravenous Once  . multivitamin  1 tablet Oral QHS  . pantoprazole sodium  40 mg Per Tube QHS  . polyethylene glycol  17 g Oral Daily  . [START ON 04/12/2015] QUEtiapine  100 mg Per Tube Daily  . sennosides  5 mL Oral BID  . sodium phosphate  1 enema Rectal Once   . sodium chloride 10 mL/hr at 04/11/15 1000  . calcium gluconate infusion for CRRT 20 g (04/11/15 0700)  . dexmedetomidine    . dextrose 5 % 1,000 mL with potassium chloride 10 mEq, sodium bicarbonate 50 mEq infusion 100 mL/hr at 04/11/15 0700  . fentaNYL infusion INTRAVENOUS 400 mcg/hr (04/11/15 1300)  . midazolam (VERSED) infusion 10 mg/hr (04/11/15 1300)  . norepinephrine (LEVOPHED) Adult infusion Stopped (04/04/15 1715)  . phenylephrine (NEO-SYNEPHRINE) Adult infusion 70 mcg/min (04/11/15 1151)  . dialysate solution for CRRT (citrate protocol) 2,000 mL/hr at 04/11/15 1150  . dialysis replacement fluid (prismasate) 200 mL/hr at 04/10/15 2240  . sodium citrate 2 %/dextrose 2.5% solution 3000 mL 250 mL/hr at 04/11/15 1010   albuterol, haloperidol lactate, heparin, midazolam, sodium chloride, sodium chloride  Exam: On vent, w  trach Opens eyes  Chest clear bilat RRR no mrg Abd obese soft ntnd No LE or UE edema R IJ cath  TTS [place]  4.5h   400/800  138kg  2K/3.5 bath  R IJ cath  Hep 4200 Hect 2 ug Mircera 75 ug q 2wks  Midodrine 10 mg pre HD      Assessment: 1 A/C resp failure s/p trach 2 ESRD usual HD 3 Hypernatremia on 1.2 L free water /d 4 Shock on pressors 5 Bivent HF LV 30%/ RV dysfxn/ PAH 6 MBD Ca/P stable 7 Anemia no esa Hb 11 8 Met acidosis d/t prolonged CRRT w buffer-free dialysate  Plan - stop CRRT, let vol come up some, hopefully will help w getting pressors off   Kelly Splinter MD [facility] pager [phone]    cell [phone] 04/11/2015, 1:04 PM    Recent Labs Lab 04/10/15 0436  04/10/15 1550  04/11/15 0427  04/11/15 1005 04/11/15 1159 04/11/15 1211  NA 140  < > 139  < > 141  147*  < > 139 146* 144  K 3.1*  < > 4.0  < > 3.0*  2.7*  < > 7.3* 3.1* 3.9  CL 107  < > 111  < > 112*  112*  < >  114* 112* 109  CO2 18*  --  16*  --  17*  --   --   --   --   GLUCOSE 92  < > 137*  < > 90  133*  < > 113* 174* 123*  BUN 40*  < > 36*  < > 28*  23*  < > 27* 23* 27*  CREATININE 3.15*  < > 3.03*  < > 2.64*  1.80*  < > 2.40* 2.20* 2.50*  CALCIUM 9.1  --  8.3*  --  8.1*  --   --   --   --   PHOS 3.1  --  2.2*  --  2.6  --   --   --   --   < > = values in this interval not displayed.  Recent Labs Lab 04/10/15 0436 04/10/15 1550 04/11/15 0427  ALBUMIN 2.7* 2.6* 2.8*    Recent Labs Lab 04/08/15 0415  04/10/15 0436  04/11/15 0427  04/11/15 1005 04/11/15 1159 04/11/15 1211  WBC 10.7*  --  11.0*  --  11.6*  --   --   --   --   NEUTROABS  --   --  8.1*  --   --   --   --   --   --   HGB 10.3*  < > 9.1*  < > 10.3*  12.6*  < > 10.2* 11.2* 11.2*  HCT 35.4*  < > 29.9*  < > 31.7*  37.0*  < > 30.0* 33.0* 33.0*  MCV 100.0  --  95.8  --  93.2  --   --   --   --   PLT 86*  --  69*  --  76*  --   --   --   --   < > = values in this interval not displayed.

## 2015-04-11 NOTE — Progress Notes (Addendum)
PULMONARY / CRITICAL CARE MEDICINE   Name: Gary Rivera MRN: [id number] DOB: [birth year]    ADMISSION DATE:  03/29/2015   REFERRING MD:  ED  CHIEF COMPLAINT:  Resp failure  SUBJECTIVE:   Still agitated. Failing weaning efforts.    VITAL SIGNS: BP 103/61 mmHg  Pulse 83  Temp(Src) 98.4 F (36.9 C) (Axillary)  Resp 30  Ht  (1.88 m)  Wt 295 lb 6.7 oz (134 kg)  BMI 37.91 kg/m2  SpO2 99%  HEMODYNAMICS: CVP:  [4 mmHg-15 mmHg] 4 mmHg  VENTILATOR SETTINGS: Vent Mode:  [-] PRVC FiO2 (%):  [40 %] 40 % Set Rate:  [30 bmp] 30 bmp Vt Set:  [600 mL] 600 mL PEEP:  [5 cmH20] 5 cmH20 Pressure Support:  [15 cmH20] 15 cmH20 Plateau Pressure:  [30 cmH20-38 cmH20] 38 cmH20  INTAKE / OUTPUT: I/O last 3 completed shifts: In: 9654.4 [I.V.:8358.1; NG/GT:996.3; IV Piggyback:300] Out: 7556 [Other:7556]  PHYSICAL EXAMINATION: General: Acutely ill appearing male agitated at times Neuro: agitated and combative. Moves all extremities spontaneously HEENT:  PERRL, moist mucus membranes. Trach unremarkable. RIJ tunneled cath, LIJ CVL.  Cardiovascular:  RRR, no murmurs, gallops, or rubs.  Lungs: Air entry equal bilaterally, coarse, no rales or wheezes.  Abdomen: obese, soft, NT, mild distension, BS +  Musculoskeletal:  Intact. Skin: Intact, left antecubital wound noted  LABS:  BMET  Recent Labs Lab 04/10/15 0436  04/10/15 1550  04/11/15 0427  04/11/15 1005 04/11/15 1159 04/11/15 1211  NA 140  < > 139  < > 141  147*  < > 139 146* 144  K 3.1*  < > 4.0  < > 3.0*  2.7*  < > 7.3* 3.1* 3.9  CL 107  < > 111  < > 112*  112*  < > 114* 112* 109  CO2 18*  --  16*  --  17*  --   --   --   --   BUN 40*  < > 36*  < > 28*  23*  < > 27* 23* 27*  CREATININE 3.15*  < > 3.03*  < > 2.64*  1.80*  < > 2.40* 2.20* 2.50*  GLUCOSE 92  < > 137*  < > 90  133*  < > 113* 174* 123*  < > = values in this interval not displayed.   Electrolytes  Recent Labs Lab 04/09/15 0431  04/10/15 0436  04/10/15 1550 04/11/15 0427  CALCIUM 8.2*  < > 9.1 8.3* 8.1*  MG 1.5*  --  1.7  --  1.6*  PHOS 3.1  < > 3.1 2.2* 2.6  < > = values in this interval not displayed.   CBC  Recent Labs Lab 04/08/15 0415  04/10/15 0436  04/11/15 0427  04/11/15 1005 04/11/15 1159 04/11/15 1211  WBC 10.7*  --  11.0*  --  11.6*  --   --   --   --   HGB 10.3*  < > 9.1*  < > 10.3*  12.6*  < > 10.2* 11.2* 11.2*  HCT 35.4*  < > 29.9*  < > 31.7*  37.0*  < > 30.0* 33.0* 33.0*  PLT 86*  --  69*  --  76*  --   --   --   --   < > = values in this interval not displayed.   Coag's  Recent Labs Lab 04/05/15 0426  APTT 140*   Sepsis Markers  Recent Labs Lab 04/05/15 0426  PROCALCITON 1.02  ABG  Recent Labs Lab 04/07/15 0514 04/07/15 0615 04/11/15 0325  PHART 7.158* 7.199* 7.283*  PCO2ART 46.7* 27.2* 35.2  PO2ART 63.0* 137* 73.0*   Liver Enzymes  Recent Labs Lab 04/10/15 0436 04/10/15 1550 04/11/15 0427  ALBUMIN 2.7* 2.6* 2.8*    Glucose  Recent Labs Lab 04/10/15 0752 04/10/15 1208 04/10/15 1554 04/10/15 2001 04/11/15 0001 04/11/15 0413  GLUCAP 104* 110* 123* 114* 141* 91    Imaging Dg Chest Port 1 View  04/11/2015  CLINICAL DATA:  Post intubation. EXAM: PORTABLE CHEST 1 VIEW COMPARISON:  04/10/2015 FINDINGS: Endotracheal tube in stable position. Left IJ central venous catheter and right IJ dialysis catheter are unchanged. Enteric catheter folds on itself, tip at the expected location of gastric cardia. Cardiomediastinal silhouette is enlarged. Mediastinal contours appear intact. There is no evidence of pleural effusion or pneumothorax. Streaky opacities in bilateral lower lobes may represent atelectasis. Osseous structures are without acute abnormality. Soft tissues are grossly normal. IMPRESSION: Stable support apparatus. Persistent bibasilar atelectasis. Electronically Signed   By: Ted Mcalpine M.D.   On: 04/11/2015 09:26   Dg Abd Portable 1v  04/10/2015  CLINICAL  DATA:  NG tube replacement. EXAM: PORTABLE ABDOMEN - 1 VIEW COMPARISON:  One view the abdomen from the same day. FINDINGS: The side port of an NG tube is in the fundus the stomach. A central venous catheter terminates at the cavoatrial junction. Bilateral lower lobe airspace disease is evident. The bowel gas pattern is normal. IMPRESSION: 1. The side port of the NG tube is in the fundus of the stomach. 2. Bilateral lower lobe pneumonia. Electronically Signed   By: Marin Roberts M.D.   On: 04/10/2015 13:32   Dg Abd Portable 1v  04/10/2015  CLINICAL DATA:  NG tube placement. EXAM: PORTABLE ABDOMEN - 1 VIEW COMPARISON:  03/29/2015 FINDINGS: NG tube is not visualized in the abdomen or visualized mid or lower chest. Gas, stool and contrast material throughout the colon. No evidence of bowel obstruction. IMPRESSION: Nonvisualization of the NG tube. Electronically Signed   By: Charlett Nose M.D.   On: 04/10/2015 13:28     STUDIES:  CT chest 1/12 >> Bibasilar atelectasis CT neck 1/12 >> Artifact with movement, basilar consolidation with air bronchograms, no mass  CULTURES: 1/8 Blood x 2 >> neg 1/8 Urine >> not done 1/12 Sputum >> Oral flora 1/11 RVP >> neg  ANTIBIOTICS: 1/8 vanc >> 1/12 1/8 zoysn >> 1/12  SIGNIFICANT EVENTS: 1/6  Discharge from [facility] following intubation 1/8  Admitted, intubated 1/9  Extubated 1/11  Re-intubated 1/13  Hypothermia on cvvhd 1/18  ETT changed d/t cuff leak.  1/20 trach 1/21 enema, agitated. Increase seroquel add precedex, hope to stop versed  LINES/TUBES: 1/8 ETT>>01/09>>>1/11>>>1/20 trach>>> LIJ CVL 1/11 >>   DISCUSSION: 55 yo MO AAF non compliant with HD and CPAP and re-presents with hypercarbic and hypoxic resp failure d/t pulmonary edema. Agitation is a major barrier to weaning. WIll try Precedex and increase seroquel. Hope that we can get him off versed and taper fentanyl to off soon. CRRT per renal.    ASSESSMENT /  PLAN:  PULMONARY A: VDRF/Hypercarbic respiratory failure OSA/OHS  Difficult Airway Bibasilar atelectasis Cuff leak; ETT changed 1/18 Tracheostomy status (1/20) P:   Full vent support--.wean as tol  Wean PEEP / FiO2 for sats > 92% Daily SBT Albuterol prn for wheezing Upright positioning as tolerated   CARDIOVASCULAR A:  HTN Pulmonary HTN MVR Continued Hypotension, most likely 2/2 sedation Chronic Combined  CHF P:  Neo gtt for MAP < 65 Hold antihypertensives Even balance per renal Stress dose steroids; hydrocortisone to 50 q12h  RENAL A:   ESRD on HD, non-compliant, RIJ tunneled cath Non-Anion Gap Metabolic Acidosis Hypomagnesemia Hypernatremia Hypokalemia P:   CRRT per renal, even balance Bicarb per renal Continue free water per tube 300 mL q6h Follow BMP Supp K, Mag  GASTROINTESTINAL A:   GI PPx Nutrition Constipation P:   Protonix Tube Feeds as tolerated  Senna bid + Miralax daily Fleets enema . No sorbitol given ESRD  HEMATOLOGIC A:   Anemia of chronic illness Thrombocytopenia; worsening. Unclear etiology DVT PPx P:  Repeat CBC in AM SCD's No heparin  INFECTIOUS A:   Left antecubital wound  Dependent Airspace Disease  P:   Repeat CBC in AM, follow fever curve Follow cultures  ENDOCRINE A:   No acute issues P:   Glucose on BMP  NEUROLOGIC A:   Metabolic Encephalopathy 2/2 hypercarbia Agitation P:   RASS goal: 0, -1 Continue home amitriptyline  Fentanyl gtt & add precedex attempt to turn off Versed gtt Continue Seroquel 25 mg daily-->increase to 100 Haldol prn Daily WUA    FAMILY  - Updates: Family and patient updated at bedside 1/19, okay to proceed with trach and PEG placement

## 2015-04-12 LAB — GLUCOSE, CAPILLARY
GLUCOSE-CAPILLARY: 118 mg/dL — AB (ref 65–99)
GLUCOSE-CAPILLARY: 139 mg/dL — AB (ref 65–99)
GLUCOSE-CAPILLARY: 98 mg/dL (ref 65–99)
Glucose-Capillary: 97 mg/dL (ref 65–99)
Glucose-Capillary: 85 mg/dL (ref 65–99)
Glucose-Capillary: 89 mg/dL (ref 65–99)

## 2015-04-12 LAB — RENAL FUNCTION PANEL
ANION GAP: 16 — AB (ref 5–15)
Albumin: 2.5 g/dL — ABNORMAL LOW (ref 3.5–5.0)
BUN: 49 mg/dL — ABNORMAL HIGH (ref 6–20)
CHLORIDE: 108 mmol/L (ref 101–111)
CO2: 17 mmol/L — AB (ref 22–32)
CREATININE: 4.31 mg/dL — AB (ref 0.61–1.24)
Calcium: 8.9 mg/dL (ref 8.9–10.3)
GFR, EST AFRICAN AMERICAN: 16 mL/min — AB (ref >60)
GFR, EST NON AFRICAN AMERICAN: 14 mL/min — AB (ref >60)
Glucose, Bld: 102 mg/dL — ABNORMAL HIGH (ref 65–99)
POTASSIUM: 2.8 mmol/L — AB (ref 3.5–5.1)
Phosphorus: 3.5 mg/dL (ref 2.5–4.6)
Sodium: 141 mmol/L (ref 135–145)

## 2015-04-12 LAB — PHOSPHORUS: PHOSPHORUS: 3.5 mg/dL (ref 2.5–4.6)

## 2015-04-12 LAB — MAGNESIUM: MAGNESIUM: 1.8 mg/dL (ref 1.7–2.4)

## 2015-04-12 MED ORDER — CEFAZOLIN SODIUM 10 G IJ SOLR
3.0000 g | INTRAMUSCULAR | Status: AC
  Administered 2015-04-13: 3 g via INTRAVENOUS
  Filled 2015-04-12 (×2): qty 3000

## 2015-04-12 MED ORDER — POTASSIUM CHLORIDE 10 MEQ/50ML IV SOLN
10.0000 meq | INTRAVENOUS | Status: AC
  Administered 2015-04-12 (×5): 10 meq via INTRAVENOUS
  Filled 2015-04-12 (×5): qty 50

## 2015-04-12 MED ORDER — HALOPERIDOL LACTATE 5 MG/ML IJ SOLN
5.0000 mg | Freq: Four times a day (QID) | INTRAMUSCULAR | Status: DC
  Administered 2015-04-12 – 2015-04-13 (×4): 5 mg via INTRAVENOUS
  Filled 2015-04-12 (×4): qty 1

## 2015-04-12 NOTE — Progress Notes (Signed)
[facility] Progress Note   Subjective: no changes. Off CRRT since yest.  Off versed gtt now on precedex for agitation.  Neo weaned to 10 ug/min no back up to 30 ug/min. Getting IV KCl for low K.  Wt 135kg, up slightly.   Filed Vitals:   04/12/15 1300 04/12/15 1330 04/12/15 1345 04/12/15 1400  BP: 104/70 102/67  102/68  Pulse: 69 70 71 70  Temp:      TempSrc:      Resp: '30 30 30 30  ' Height:      Weight:      SpO2: 99% 98% 98% 98%    Inpatient medications: . amitriptyline  25 mg Oral QHS  . antiseptic oral rinse  7 mL Mouth Rinse 10 times per day  . [START ON 04/13/2015]  ceFAZolin (ANCEF) IV  3 g Intravenous to XRAY  . chlorhexidine gluconate  15 mL Mouth Rinse BID  . doxercalciferol  3 mcg Intravenous Q T,Th,Sat-1800  . feeding supplement (NEPRO CARB STEADY)  1,000 mL Per Tube Q24H  . feeding supplement (PRO-STAT SUGAR FREE 64)  60 mL Per Tube 5 X Daily  . free water  300 mL Per Tube Q6H  . haloperidol lactate  5 mg Intravenous Q6H  . hydrocortisone sod succinate (SOLU-CORTEF) inj  50 mg Intravenous Q12H  . multivitamin  1 tablet Oral QHS  . pantoprazole sodium  40 mg Per Tube QHS  . QUEtiapine  100 mg Per Tube Daily  . sennosides  5 mL Oral BID   . sodium chloride 10 mL/hr at 04/12/15 1300  . dexmedetomidine 1.2 mcg/kg/hr (04/12/15 1420)  . HYDROmorphone 4 mg/hr (04/12/15 1003)  . phenylephrine (NEO-SYNEPHRINE) Adult infusion 30 mcg/min (04/12/15 1115)   albuterol, heparin, HYDROmorphone (DILAUDID) injection, midazolam, sodium chloride  Exam: On vent, w trach Opens eyes  Chest clear bilat RRR no mrg Abd obese soft ntnd No LE or UE edema R IJ cath  TTS [place]  4.5h   400/800  138kg  2K/3.5 bath  R IJ cath  Hep 4200 Hect 2 ug Mircera 75 ug q 2wks  Midodrine 10 mg pre HD      Assessment: 1 A/C resp failure s/p trach 2 Agitation/ sedation - on scheduled IV haldol + po Seroquel, also dilaudid/ precedex drips 2 ESRD sp CRRT, plan intermittent HD  now. Next HD prob Tuesday.  3 Hypernatremia better 1.2 L free water /d 4 Shock on pressors, trying to wean 5 Bivent HF LV 30%/ RV dysfxn/ PAH 6 MBD Ca/P stable 7 Anemia no esa Hb 11 8 Met acidosis d/t prolonged CRRT w buffer-free dialysate 9 Volume 3kg below prior dry wt  Plan - intermittent HD, no need today. Reassess Monday. Let vol come up some.    Kelly Splinter MD [facility] pager [phone]    cell [phone] 04/12/2015, 3:49 PM    Recent Labs Lab 04/10/15 1550  04/11/15 0427  04/11/15 1159 04/11/15 1211 04/12/15 0424 04/12/15 0425  NA 139  < > 141  147*  < > 146* 144  --  141  K 4.0  < > 3.0*  2.7*  < > 3.1* 3.9  --  2.8*  CL 111  < > 112*  112*  < > 112* 109  --  108  CO2 16*  --  17*  --   --   --   --  17*  GLUCOSE 137*  < > 90  133*  < > 174* 123*  --  102*  BUN 36*  < > 28*  23*  < > 23* 27*  --  49*  CREATININE 3.03*  < > 2.64*  1.80*  < > 2.20* 2.50*  --  4.31*  CALCIUM 8.3*  --  8.1*  --   --   --   --  8.9  PHOS 2.2*  --  2.6  --   --   --  3.5 3.5  < > = values in this interval not displayed.  Recent Labs Lab 04/10/15 1550 04/11/15 0427 04/12/15 0425  ALBUMIN 2.6* 2.8* 2.5*    Recent Labs Lab 04/08/15 0415  04/10/15 0436  04/11/15 0427  04/11/15 1005 04/11/15 1159 04/11/15 1211  WBC 10.7*  --  11.0*  --  11.6*  --   --   --   --   NEUTROABS  --   --  8.1*  --   --   --   --   --   --   HGB 10.3*  < > 9.1*  < > 10.3*  12.6*  < > 10.2* 11.2* 11.2*  HCT 35.4*  < > 29.9*  < > 31.7*  37.0*  < > 30.0* 33.0* 33.0*  MCV 100.0  --  95.8  --  93.2  --   --   --   --   PLT 86*  --  69*  --  76*  --   --   --   --   < > = values in this interval not displayed.

## 2015-04-12 NOTE — Progress Notes (Signed)
PULMONARY / CRITICAL CARE MEDICINE   Name: Gary Rivera MRN: [id number] DOB: [birth year]    ADMISSION DATE:  03/29/2015 CONSULTATION DATE:  03/29/15  REFERRING MD :  EDP PRIMARY SERVICE: PCCM   BRIEF PATIENT DESCRIPTION: 55 yo M with ESRD noncompliant with HD who presented with acute hypercapnic/hypoxic respiratory failure requiring intubation and later tracheostomy.   SIGNIFICANT EVENTS / STUDIES:  1/8 Admitted with respiratory failure requiring intubation  1/9 Extubated  1/11 Re-intubated 1/12 CT chest with bibasilar atelectasis  1/12 CT neck with basilar consolidation with air bronchograms 1/13 Hypothermia on CCVHD 1/16 Started on neo 1/16 Restarted on solu-cortef 1/18 ETT changed due to cuff leak  1/20 Tracheostomy  1/21 Enema, agitated, seroquel increased, precedex and dilaudid added 1/21 Continues to be agitated, max'd on versed, precedex, dilaudid,    LINES / TUBES: 1/8 ETT>>01/09>>>1/11>>>1/20 trach>>> 1/11 LIJ CVL  >>   CULTURES: 1/8 Blood x 2 >> neg 1/8 Urine >> not done 1/12 Sputum >> Oral flora 1/11 RVP >> neg  ANTIBIOTICS: 1/8 vanc >> 1/12 1/8 zoysn >> 1/12    SUBJECTIVE: Per nurse much better with dilaudid infusion that was started yesterday. When awake continues to be agitated. BP stable on neo. To have PEG tomm.    VITAL SIGNS: Temp:  [97.9 F (36.6 C)-99.1 F (37.3 C)] 98 F (36.7 C) (01/22 0800) Pulse Rate:  [58-103] 103 (01/22 1000) Resp:  [17-32] 30 (01/22 1000) BP: (74-132)/(47-83) 132/80 mmHg (01/22 1000) SpO2:  [94 %-100 %] 100 % (01/22 1000) FiO2 (%):  [30 %-40 %] 30 % (01/22 0900) Weight:  [298 lb 1 oz (135.2 kg)] 298 lb 1 oz (135.2 kg) (01/22 0402) HEMODYNAMICS: CVP:  [4 mmHg-14 mmHg] 14 mmHg VENTILATOR SETTINGS: Vent Mode:  [-] PRVC FiO2 (%):  [30 %-40 %] 30 % Set Rate:  [30 bmp] 30 bmp Vt Set:  [600 mL] 600 mL PEEP:  [5 cmH20] 5 cmH20 Plateau Pressure:  [20 cmH20-38 cmH20] 20 cmH20 INTAKE / OUTPUT: Intake/Output       01/21 0701 - 01/22 0700 01/22 0701 - 01/23 0700   P.O. 0 0   I.V. (mL/kg) 2556.5 (18.9) 101.2 (0.7)   NG/GT 1840 235   IV Piggyback 150 100   Total Intake(mL/kg) 4546.5 (33.6) 436.2 (3.2)   Other 1845    Stool 0    Total Output 1845     Net +2701.5 +436.2        Stool Occurrence 2 x      PHYSICAL EXAMINATION: General:  Sedated on vent Neuro:  RAAS -3 HEENT:  OG tube Cardiovascular:  Normal rate and rhythm  Lungs:  Trach in place. Anterior fields clear.  Abdomen:  Soft, non-tender, non-distended, normal BS Musculoskeletal: SCD's, trace edema  Skin:  No rashes   LABS:  CBC  Recent Labs Lab 04/08/15 0415  04/10/15 0436  04/11/15 0427  04/11/15 1005 04/11/15 1159 04/11/15 1211  WBC 10.7*  --  11.0*  --  11.6*  --   --   --   --   HGB 10.3*  < > 9.1*  < > 10.3*  12.6*  < > 10.2* 11.2* 11.2*  HCT 35.4*  < > 29.9*  < > 31.7*  37.0*  < > 30.0* 33.0* 33.0*  PLT 86*  --  69*  --  76*  --   --   --   --   < > = values in this interval not displayed. Coag's No results for input(s): APTT,  INR in the last 168 hours. BMET  Recent Labs Lab 04/10/15 1550  04/11/15 0427  04/11/15 1159 04/11/15 1211 04/12/15 0425  NA 139  < > 141  147*  < > 146* 144 141  K 4.0  < > 3.0*  2.7*  < > 3.1* 3.9 2.8*  CL 111  < > 112*  112*  < > 112* 109 108  CO2 16*  --  17*  --   --   --  17*  BUN 36*  < > 28*  23*  < > 23* 27* 49*  CREATININE 3.03*  < > 2.64*  1.80*  < > 2.20* 2.50* 4.31*  GLUCOSE 137*  < > 90  133*  < > 174* 123* 102*  < > = values in this interval not displayed. Electrolytes  Recent Labs Lab 04/10/15 0436 04/10/15 1550 04/11/15 0427 04/12/15 0424 04/12/15 0425  CALCIUM 9.1 8.3* 8.1*  --  8.9  MG 1.7  --  1.6* 1.8  --   PHOS 3.1 2.2* 2.6 3.5 3.5   Sepsis Markers No results for input(s): LATICACIDVEN, PROCALCITON, O2SATVEN in the last 168 hours. ABG  Recent Labs Lab 04/07/15 0514 04/07/15 0615 04/11/15 0325  PHART 7.158* 7.199* 7.283*  PCO2ART  46.7* 27.2* 35.2  PO2ART 63.0* 137* 73.0*   Liver Enzymes  Recent Labs Lab 04/10/15 1550 04/11/15 0427 04/12/15 0425  ALBUMIN 2.6* 2.8* 2.5*   Cardiac Enzymes No results for input(s): TROPONINI, PROBNP in the last 168 hours. Glucose  Recent Labs Lab 04/11/15 0413 04/11/15 1651 04/11/15 1932 04/11/15 2359 04/12/15 0359 04/12/15 0731  GLUCAP 91 96 100* 118* 97 85    Imaging Dg Chest Port 1 View  04/11/2015  CLINICAL DATA:  Post intubation. EXAM: PORTABLE CHEST 1 VIEW COMPARISON:  04/10/2015 FINDINGS: Endotracheal tube in stable position. Left IJ central venous catheter and right IJ dialysis catheter are unchanged. Enteric catheter folds on itself, tip at the expected location of gastric cardia. Cardiomediastinal silhouette is enlarged. Mediastinal contours appear intact. There is no evidence of pleural effusion or pneumothorax. Streaky opacities in bilateral lower lobes may represent atelectasis. Osseous structures are without acute abnormality. Soft tissues are grossly normal. IMPRESSION: Stable support apparatus. Persistent bibasilar atelectasis. Electronically Signed   By: Ted Mcalpine M.D.   On: 04/11/2015 09:26   Dg Abd Portable 1v  04/10/2015  CLINICAL DATA:  NG tube replacement. EXAM: PORTABLE ABDOMEN - 1 VIEW COMPARISON:  One view the abdomen from the same day. FINDINGS: The side port of an NG tube is in the fundus the stomach. A central venous catheter terminates at the cavoatrial junction. Bilateral lower lobe airspace disease is evident. The bowel gas pattern is normal. IMPRESSION: 1. The side port of the NG tube is in the fundus of the stomach. 2. Bilateral lower lobe pneumonia. Electronically Signed   By: Marin Roberts M.D.   On: 04/10/2015 13:32   Dg Abd Portable 1v  04/10/2015  CLINICAL DATA:  NG tube placement. EXAM: PORTABLE ABDOMEN - 1 VIEW COMPARISON:  03/29/2015 FINDINGS: NG tube is not visualized in the abdomen or visualized mid or lower chest. Gas,  stool and contrast material throughout the colon. No evidence of bowel obstruction. IMPRESSION: Nonvisualization of the NG tube. Electronically Signed   By: Charlett Nose M.D.   On: 04/10/2015 13:28     CXR: none  ASSESSMENT / PLAN:  PULMONARY A: Acute on chronic hypoxic respiratory failure requiring tracheostomy on 1/20 Pulmonary edema - resolved  Pulmonary hypertension OHS /OSA Bibasilar atelectasis  P:   Full vent support Wean PEEP/FIO2 to keep SpO2 >92% Daily SBT once can wean off sedation  Albuterol Q 3 hr PRN  Trach care CXR in AM  CARDIOVASCULAR A: Hypotension on pressor  History of hypertension  Acute on chronic combined CHF (EF 30%) P:  Wean neo to keep MAP >65 Continue hydrocortisone 50 mg Q12  RENAL A:  ESRD on HD with history of noncompliance  Hypokalemia  Hypernatremia  Hypomagnesemia - resolved  P:   Nephrology following  To resume CRRT once volume up  Replace K Free water per tube 300 mL Q 6 hr  Monitor renal function  GASTROINTESTINAL A: Nutrition GI PPx Nutrition  Constipation  P:   Tube feeds To have PEG placed tomm  Protonix 40 mg daily  Senokot-S BID and miralax daily No magnesium or phosphate based enemas in setting of ESRD  HEMATOLOGIC A:  Chronic anemia of renal disease Thrombocytopenia - no active bleeding  DVT Ppx P:  Monitor CBC Consider ESA if Hg<10 SCD's  INFECTIOUS A: Mild leukocytosis  Left antecubital wound  P:   Monitor CBC and fever curve   ENDOCRINE A:  RAI - AM cortisol 8 on 1/8 Secondary hyperparathyroidism of renal origin  P:   Continue hydrocortisone 50 mg Q12 Monitor blood glucose Start SSI if CBG>180 Hectorol TTS  NEUROLOGIC A:  Agitation/sedation on vent P:   Try to wean precedex, dilaudid, and versed Diluadid and versed PRN RAAS goal 0  Seroquel 100 mg daily  Amitriptyline 25 mg daily  Haldol PRN  Daily Regis Bill MD PGY-3 IMT[facility] Pager [phone]   04/12/2015, 11:24 AM

## 2015-04-12 NOTE — Plan of Care (Signed)
Problem: Consults Goal: Tracheostomy Patient/Family Teaching See Patient Education Module for education specifics.  Outcome: Completed/Met Date Met:  04/12/15 Horris Latino (sister)

## 2015-04-12 NOTE — Progress Notes (Addendum)
150 cc of fentanyl and 80 cc versed wasted in the sink  Witnessed by Di Kindle., RN & Prince Rome, RN

## 2015-04-12 NOTE — Progress Notes (Signed)
Patient becoming aggressive and combative with multiple staff members when turned, touched, mouth care attempted or attempting to clean patient after stooling.  Patient unable to follow commands, be reoriented or calmed.  IV drips increased and currently on max dosages.  Will continue to monitor closely. [place], Mitzi Hansen

## 2015-04-13 ENCOUNTER — Inpatient Hospital Stay (HOSPITAL_COMMUNITY): Payer: Medicare Other

## 2015-04-13 ENCOUNTER — Encounter (HOSPITAL_COMMUNITY): Payer: Self-pay | Admitting: Otolaryngology

## 2015-04-13 LAB — GLUCOSE, CAPILLARY
GLUCOSE-CAPILLARY: 120 mg/dL — AB (ref 65–99)
Glucose-Capillary: 114 mg/dL — ABNORMAL HIGH (ref 65–99)
Glucose-Capillary: 118 mg/dL — ABNORMAL HIGH (ref 65–99)
Glucose-Capillary: 119 mg/dL — ABNORMAL HIGH (ref 65–99)
Glucose-Capillary: 124 mg/dL — ABNORMAL HIGH (ref 65–99)
Glucose-Capillary: 150 mg/dL — ABNORMAL HIGH (ref 65–99)
Glucose-Capillary: 94 mg/dL (ref 65–99)

## 2015-04-13 LAB — RENAL FUNCTION PANEL
Albumin: 2.4 g/dL — ABNORMAL LOW (ref 3.5–5.0)
Anion gap: 16 — ABNORMAL HIGH (ref 5–15)
BUN: 89 mg/dL — AB (ref 6–20)
CALCIUM: 8.8 mg/dL — AB (ref 8.9–10.3)
CO2: 16 mmol/L — AB (ref 22–32)
CREATININE: 6.77 mg/dL — AB (ref 0.61–1.24)
Chloride: 105 mmol/L (ref 101–111)
GFR calc non Af Amer: 8 mL/min — ABNORMAL LOW (ref >60)
GFR, EST AFRICAN AMERICAN: 10 mL/min — AB (ref >60)
Glucose, Bld: 129 mg/dL — ABNORMAL HIGH (ref 65–99)
Phosphorus: 6.2 mg/dL — ABNORMAL HIGH (ref 2.5–4.6)
Potassium: 3.4 mmol/L — ABNORMAL LOW (ref 3.5–5.1)
SODIUM: 137 mmol/L (ref 135–145)

## 2015-04-13 LAB — CBC
HCT: 27.2 % — ABNORMAL LOW (ref 39.0–52.0)
HEMOGLOBIN: 8.7 g/dL — AB (ref 13.0–17.0)
MCH: 29.2 pg (ref 26.0–34.0)
MCHC: 32 g/dL (ref 30.0–36.0)
MCV: 91.3 fL (ref 78.0–100.0)
Platelets: 81 10*3/uL — ABNORMAL LOW (ref 150–400)
RBC: 2.98 MIL/uL — AB (ref 4.22–5.81)
RDW: 19.1 % — ABNORMAL HIGH (ref 11.5–15.5)
WBC: 6.7 10*3/uL (ref 4.0–10.5)

## 2015-04-13 LAB — PROTIME-INR
INR: 1.42 (ref 0.00–1.49)
PROTHROMBIN TIME: 17.4 s — AB (ref 11.6–15.2)

## 2015-04-13 LAB — MAGNESIUM: MAGNESIUM: 2 mg/dL (ref 1.7–2.4)

## 2015-04-13 LAB — APTT: APTT: 32 s (ref 24–37)

## 2015-04-13 LAB — CALCIUM, IONIZED: CALCIUM, IONIZED, SERUM: 3.8 mg/dL — AB (ref 4.5–5.6)

## 2015-04-13 MED ORDER — VALPROATE SODIUM 500 MG/5ML IV SOLN
250.0000 mg | Freq: Three times a day (TID) | INTRAVENOUS | Status: DC
  Administered 2015-04-13 (×3): 250 mg via INTRAVENOUS
  Filled 2015-04-13 (×6): qty 2.5

## 2015-04-13 MED ORDER — LORAZEPAM 2 MG/ML IJ SOLN
2.0000 mg | Freq: Four times a day (QID) | INTRAMUSCULAR | Status: DC
  Administered 2015-04-13 – 2015-04-15 (×9): 2 mg via INTRAVENOUS
  Filled 2015-04-13 (×10): qty 1

## 2015-04-13 MED ORDER — QUETIAPINE FUMARATE 100 MG PO TABS
100.0000 mg | ORAL_TABLET | Freq: Once | ORAL | Status: AC
  Administered 2015-04-13: 100 mg via ORAL
  Filled 2015-04-13: qty 1

## 2015-04-13 MED ORDER — HYDROCORTISONE NA SUCCINATE PF 100 MG IJ SOLR
50.0000 mg | Freq: Four times a day (QID) | INTRAMUSCULAR | Status: DC
  Administered 2015-04-13 – 2015-04-15 (×8): 50 mg via INTRAVENOUS
  Filled 2015-04-13 (×8): qty 2

## 2015-04-13 MED ORDER — MIDAZOLAM HCL 2 MG/2ML IJ SOLN
1.0000 mg | INTRAMUSCULAR | Status: DC | PRN
  Administered 2015-04-13 – 2015-04-15 (×5): 1 mg via INTRAVENOUS
  Filled 2015-04-13 (×5): qty 2

## 2015-04-13 MED ORDER — SODIUM CHLORIDE 0.9 % IV SOLN
0.0000 ug/h | INTRAVENOUS | Status: DC
  Administered 2015-04-13: 150 ug/h via INTRAVENOUS
  Administered 2015-04-14: 400 ug/h via INTRAVENOUS
  Administered 2015-04-14: 200 ug/h via INTRAVENOUS
  Administered 2015-04-14: 150 ug/h via INTRAVENOUS
  Administered 2015-04-15: 400 ug/h via INTRAVENOUS
  Administered 2015-04-15: 350 ug/h via INTRAVENOUS
  Filled 2015-04-13 (×6): qty 50

## 2015-04-13 MED ORDER — MIDAZOLAM HCL 2 MG/2ML IJ SOLN
INTRAMUSCULAR | Status: AC
  Filled 2015-04-13: qty 2

## 2015-04-13 MED ORDER — HALOPERIDOL LACTATE 5 MG/ML IJ SOLN: 5.0000 mg | Freq: Four times a day (QID) | INTRAMUSCULAR | Status: DC | PRN

## 2015-04-13 MED ORDER — QUETIAPINE FUMARATE 100 MG PO TABS
200.0000 mg | ORAL_TABLET | Freq: Every day | ORAL | Status: DC
  Administered 2015-04-14 – 2015-04-15 (×2): 200 mg
  Filled 2015-04-13 (×2): qty 2

## 2015-04-13 MED ORDER — VALPROATE SODIUM 500 MG/5ML IV SOLN
15.0000 mg/kg | Freq: Once | INTRAVENOUS | Status: DC
  Filled 2015-04-13: qty 20.93

## 2015-04-13 NOTE — Progress Notes (Signed)
Pt became very combative and uncontrollable. Attempted pulling out CVC, Trach, NG tube. Spoke to e[employer] and orders for soft restraints placed. Initiated restraints with 2 other RNs at bedside. Restraints applied correctly and no signs of injury noted. Will continue to assess and monitor the restrains and the patient closely.

## 2015-04-13 NOTE — Progress Notes (Signed)
Per MD keep MAP 55 or greater. MD stated to wean down neo. Informed him that SBP drops below 85. He stated that was okay. Will try to titrate down my neo. Will continue to assess and monitor the pt closely.

## 2015-04-13 NOTE — Progress Notes (Signed)
[facility] Progress Note   Subjective: na better. Wt's up 139kg .  3.7 fluids in  Filed Vitals:   04/13/15 1130 04/13/15 1200 04/13/15 1208 04/13/15 1219  BP: 89/63  72/45 72/45  Pulse: 76  72 76  Temp:  99.1 F (37.3 C)    TempSrc:  Oral    Resp: '30  24 30  ' Height:      Weight:      SpO2: 96%  93% 94%    Inpatient medications: . antiseptic oral rinse  7 mL Mouth Rinse 10 times per day  . chlorhexidine gluconate  15 mL Mouth Rinse BID  . doxercalciferol  3 mcg Intravenous Q T,Th,Sat-1800  . feeding supplement (NEPRO CARB STEADY)  1,000 mL Per Tube Q24H  . feeding supplement (PRO-STAT SUGAR FREE 64)  60 mL Per Tube 5 X Daily  . hydrocortisone sod succinate (SOLU-CORTEF) inj  50 mg Intravenous Q6H  . LORazepam  2 mg Intravenous Q6H  . multivitamin  1 tablet Oral QHS  . pantoprazole sodium  40 mg Per Tube QHS  . [START ON 04/14/2015] QUEtiapine  200 mg Per Tube Daily  . sennosides  5 mL Oral BID  . valproate sodium  250 mg Intravenous TID   . sodium chloride 10 mL/hr at 04/12/15 2000  . dexmedetomidine 0.6 mcg/kg/hr (04/13/15 1129)  . fentaNYL infusion INTRAVENOUS 150 mcg/hr (04/13/15 1247)  . phenylephrine (NEO-SYNEPHRINE) Adult infusion 30 mcg/min (04/13/15 1224)   albuterol, heparin, midazolam, sodium chloride  Exam: On vent, w trach Opens eyes  Chest clear bilat RRR no mrg Abd obese soft ntnd No LE or UE edema R IJ cath  [facility]  4.5h   400/800  138kg  2K/3.5 bath  R IJ cath  Hep 4200 Hect 2 ug Mircera 75 ug q 2wks  Midodrine 10 mg pre HD      Assessment: 1 A/C resp failure s/p trach 2 Agitation/ sedation - on scheduled ativan/ Seroquel/ depakote + fentanyl/ precedex drips 2 ESRD sp CRRT 3 Hypernatremia better 1.2 L free water /d 4 Shock on pressors, trying to wean 5 Bivent HF LV 30%/ RV dysfxn/ PAH 6 MBD Ca/P stable 7 Anemia no esa Hb 11 8 Met acidosis - will improve w HD 9 Vol is +2kg today  Plan - HD tuesday   Kelly Splinter  MD [facility] pager [phone]    cell [phone] 04/13/2015, 1:32 PM    Recent Labs Lab 04/11/15 0427  04/11/15 1211 04/12/15 0424 04/12/15 0425 04/13/15 0500  NA 141  147*  < > 144  --  141 137  K 3.0*  2.7*  < > 3.9  --  2.8* 3.4*  CL 112*  112*  < > 109  --  108 105  CO2 17*  --   --   --  17* 16*  GLUCOSE 90  133*  < > 123*  --  102* 129*  BUN 28*  23*  < > 27*  --  49* 89*  CREATININE 2.64*  1.80*  < > 2.50*  --  4.31* 6.77*  CALCIUM 8.1*  --   --   --  8.9 8.8*  PHOS 2.6  --   --  3.5 3.5 6.2*  < > = values in this interval not displayed.  Recent Labs Lab 04/11/15 0427 04/12/15 0425 04/13/15 0500  ALBUMIN 2.8* 2.5* 2.4*    Recent Labs Lab 04/10/15 0436  04/11/15 0427  04/11/15 1159 04/11/15 1211  04/13/15 0500  WBC 11.0*  --  11.6*  --   --   --  6.7  NEUTROABS 8.1*  --   --   --   --   --   --   HGB 9.1*  < > 10.3*  12.6*  < > 11.2* 11.2* 8.7*  HCT 29.9*  < > 31.7*  37.0*  < > 33.0* 33.0* 27.2*  MCV 95.8  --  93.2  --   --   --  91.3  PLT 69*  --  76*  --   --   --  81*  < > = values in this interval not displayed.

## 2015-04-13 NOTE — Progress Notes (Signed)
Patient ID: [name], male   DOB: 12/10/1960, 55 y.o.   MRN: [id number]   Pt was scheduled for percutaneous gastric tube placement  plts trending up at 81 today (would like close to 100 for procedure)  But , unfortunately, pt has been recorded as being very agitated today  Best to hold on G tube today per Dr Deanne Coffer Will recheck in am  May resume tube feeds now Will check in am----if pt more calm; will proceed and hold feeds in am

## 2015-04-13 NOTE — Progress Notes (Signed)
PULMONARY / CRITICAL CARE MEDICINE   Name: Gary Rivera MRN: [id number] DOB: October 09, 1960    ADMISSION DATE:  03/29/2015 CONSULTATION DATE:  03/29/15  REFERRING MD :  EDP PRIMARY SERVICE: PCCM   BRIEF PATIENT DESCRIPTION: 55 yo M with ESRD noncompliant with HD who presented with acute hypercapnic/hypoxic respiratory failure requiring intubation and later tracheostomy.   SIGNIFICANT EVENTS / STUDIES:  1/8 Admitted with respiratory failure requiring intubation  1/9 Extubated  1/11 Re-intubated 1/12 CT chest with bibasilar atelectasis  1/12 CT neck with basilar consolidation with air bronchograms 1/13 Hypothermia on CCVHD 1/16 Started on neo 1/16 Restarted on solu-cortef 1/18 ETT changed due to cuff leak  1/20 Tracheostomy  1/21 Enema, agitated, seroquel increased, precedex and dilaudid added 1/21 Continues to be agitated, max'd on versed/precedex/dilaudid 1/22 Added scheduled haldol, no longer on versed infusion     LINES / TUBES: 1/8 ETT>>01/09>>>1/11>>>1/20 trach>>> 1/11 [facility] CVL  >>   CULTURES: 1/8 Blood x 2 >> neg 1/8 Urine >> not done 1/12 Sputum >> Oral flora 1/11 RVP >> neg  ANTIBIOTICS: 1/8 vanc >> 1/12 1/8 zoysn >> 1/12    SUBJECTIVE: Per nurse still very agitated and violent at times max'd out on dilaudid and precedex infusions. On scheduled haldol since yesterday.BP stable on neo with CVP 13 and +11 L net since admission. To have possible IHD and PEG today.    VITAL SIGNS: Temp:  [98 F (36.7 C)-99.9 F (37.7 C)] 98.3 F (36.8 C) (01/23 0800) Pulse Rate:  [58-103] 70 (01/23 0800) Resp:  [30] 30 (01/23 0800) BP: (72-132)/(49-80) 97/70 mmHg (01/23 0800) SpO2:  [92 %-100 %] 95 % (01/23 0800) FiO2 (%):  [30 %-40 %] 40 % (01/23 0800) Weight:  [307 lb 8.7 oz (139.5 kg)] 307 lb 8.7 oz (139.5 kg) (01/23 0400) HEMODYNAMICS: CVP:  [11 mmHg-19 mmHg] 13 mmHg VENTILATOR SETTINGS: Vent Mode:  [-] PRVC FiO2 (%):  [30 %-40 %] 40 % Set Rate:  [30 bmp] 30 bmp Vt  Set:  [600 mL] 600 mL PEEP:  [5 cmH20] 5 cmH20 Plateau Pressure:  [20 cmH20-24 cmH20] 22 cmH20 INTAKE / OUTPUT: Intake/Output      01/22 0701 - 01/23 0700 01/23 0701 - 01/24 0700   P.O. 0    I.V. (mL/kg) 1564.7 (11.2) 73.2 (0.5)   NG/GT 2000 0   IV Piggyback 200    Total Intake(mL/kg) 3764.7 (27) 73.2 (0.5)   Other     Stool     Total Output       Net +3764.7 +73.2        Stool Occurrence 3 x      PHYSICAL EXAMINATION: General:  Sedated on vent Neuro:  RAAS -3 on dilaudid  HEENT:  OG tube Cardiovascular:  Normal rate and rhythm  Lungs:  Trach in place. Anterior fields clear.  Abdomen:  Soft, non-tender, non-distended, normal BS Musculoskeletal: SCD's, trace edema  Skin:  No rashes   LABS:  CBC  Recent Labs Lab 04/10/15 0436  04/11/15 0427  04/11/15 1159 04/11/15 1211 04/13/15 0500  WBC 11.0*  --  11.6*  --   --   --  6.7  HGB 9.1*  < > 10.3*  12.6*  < > 11.2* 11.2* 8.7*  HCT 29.9*  < > 31.7*  37.0*  < > 33.0* 33.0* 27.2*  PLT 69*  --  76*  --   --   --  81*  < > = values in this interval not displayed. Coag's  Recent Labs Lab 04/13/15 0500  APTT 32  INR 1.42   BMET  Recent Labs Lab 04/11/15 0427  04/11/15 1211 04/12/15 0425 04/13/15 0500  NA 141  147*  < > 144 141 137  K 3.0*  2.7*  < > 3.9 2.8* 3.4*  CL 112*  112*  < > 109 108 105  CO2 17*  --   --  17* 16*  BUN 28*  23*  < > 27* 49* 89*  CREATININE 2.64*  1.80*  < > 2.50* 4.31* 6.77*  GLUCOSE 90  133*  < > 123* 102* 129*  < > = values in this interval not displayed. Electrolytes  Recent Labs Lab 04/11/15 0427 04/12/15 0424 04/12/15 0425 04/13/15 0500  CALCIUM 8.1*  --  8.9 8.8*  MG 1.6* 1.8  --  2.0  PHOS 2.6 3.5 3.5 6.2*   Sepsis Markers No results for input(s): LATICACIDVEN, PROCALCITON, O2SATVEN in the last 168 hours. ABG  Recent Labs Lab 04/07/15 0514 04/07/15 0615 04/11/15 0325  PHART 7.158* 7.199* 7.283*  PCO2ART 46.7* 27.2* 35.2  PO2ART 63.0* 137* 73.0*    Liver Enzymes  Recent Labs Lab 04/11/15 0427 04/12/15 0425 04/13/15 0500  ALBUMIN 2.8* 2.5* 2.4*   Cardiac Enzymes No results for input(s): TROPONINI, PROBNP in the last 168 hours. Glucose  Recent Labs Lab 04/12/15 0731 04/12/15 1113 04/12/15 1558 04/12/15 2043 04/13/15 0011 04/13/15 0450  GLUCAP 85 139* 89 98 118* 94    Imaging Dg Chest Port 1 View  04/13/2015  CLINICAL DATA:  Tracheostomy. EXAM: PORTABLE CHEST 1 VIEW COMPARISON:  04/11/2015 . FINDINGS: Tracheostomy, dialysis catheter, left IJ line stable position. Stable dense bibasilar atelectasis. Cardiomegaly with mild pulmonary vascular congestion. Small left pleural effusion cannot be excluded. IMPRESSION: 1. Lines and tubes in stable position. 2. Dense bibasilar atelectasis, unchanged from prior exam. 3. Cardiomegaly with mild pulmonary vascular congestion. Small left pleural effusion cannot be excluded . 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%) AI P:  Wean neo to keep MAP >55 with chf, esrd If unable to dc, dc precedex Continue hydrocortisone 50 mg to q6h  RENAL A:  ESRD on HD with history of noncompliance  Hypokalemia - 3.4 Hypernatremia - resolved  Hypomagnesemia - resolved  P:   Nephrology following  To resume IHD today possibly   Replace K Free water dc Monitor renal function  GASTROINTESTINAL A: Nutrition GI PPx Nutrition  Constipation  P:   Tube feeds To have PEG placed  today  Protonix 40 mg daily  Senokot BID  No magnesium or phosphate based enemas in setting of ESRD  HEMATOLOGIC A:  Chronic anemia of renal disease Thrombocytopenia - no active bleeding  DVT Ppx P:  Monitor CBC Consider ESA if Hg<10 SCD's  INFECTIOUS A: Mild leukocytosis - resolved  Left antecubital wound  P:   Monitor CBC and fever curve   ENDOCRINE A:  RAI - AM cortisol 8 on 1/8 Secondary  hyperparathyroidism of renal origin  P:   Continue hydrocortisone 50 back to q6h Monitor blood glucose Start SSI if CBG>180 Hectorol TTS  NEUROLOGIC A: Severe agitation/sedation on vent, refractory delirium  P:   Try to wean dilaudid and precedex infusion Diluadid consider dc given clearance, consider back to fent RAAS goal 0  Seroquel 100 mg daily, max this  Amitriptyline 25 mg daily - dc Haldol 5 mg Q 6 hr, monitor QTc  (435 this AM) Daily WUA   Otis Brace MD PGY-3 IMTS Pager [phone]   04/13/2015, 8:14 AM   STAFF NOTE: Cindi Carbon, MD FACP have personally reviewed patient's available data, including medical history, events of note, physical examination and test results as part of my evaluation. I have discussed with resident/NP and other care providers such as pharmacist, RN and RRT. In addition, I personally evaluated patient and elicited key findings of: agitation remains, he is on di lauded, i ave concerns that he will not clear this well, he remains on low dose neo, goal MAP 55 with esrd / chf, change back to fent, goal is to dc precedex, add Depakote per pharmacy for refractory delirium, add around the clock ativan in roder to dc precedex, for peg, cant wean until off such high dose sedation, hope to dc neo, HD per renal, family in room updated by me, increase Seroquel, dc amitriptyline , lung volumes are small, ltach consideration The patient is critically ill with multiple organ systems failure and requires high complexity decision making for assessment and  support, frequent evaluation and titration of therapies, application of advanced monitoring technologies and extensive interpretation of multiple databases.   Critical Care Time devoted to patient care services described in this note is 30 Minutes. This time reflects time of care of this signee: Rory Percy, MD FACP. This critical care time does not reflect procedure time, or teaching time or supervisory time of PA/NP/Med student/Med Resident etc but could involve care discussion time. Rest per NP/medical resident whose note is outlined above and that I agree with   Mcarthur Rossetti. Tyson Alias, MD, FACP Pgr: [phone]  Pulmonary & Critical Care 04/13/2015 10:55 AM

## 2015-04-13 NOTE — Progress Notes (Signed)
Wasted 70 mL of Dilaudid gtt in sink with Bronson Curb RN

## 2015-04-14 ENCOUNTER — Inpatient Hospital Stay (HOSPITAL_COMMUNITY): Payer: Medicare Other

## 2015-04-14 DIAGNOSIS — Z4659 Encounter for fitting and adjustment of other gastrointestinal appliance and device: Secondary | ICD-10-CM

## 2015-04-14 DIAGNOSIS — L8990 Pressure ulcer of unspecified site, unspecified stage: Secondary | ICD-10-CM | POA: Insufficient documentation

## 2015-04-14 LAB — CBC
HEMATOCRIT: 24.9 % — AB (ref 39.0–52.0)
Hemoglobin: 7.9 g/dL — ABNORMAL LOW (ref 13.0–17.0)
MCH: 28.6 pg (ref 26.0–34.0)
MCHC: 31.7 g/dL (ref 30.0–36.0)
MCV: 90.2 fL (ref 78.0–100.0)
PLATELETS: 87 10*3/uL — AB (ref 150–400)
RBC: 2.76 MIL/uL — ABNORMAL LOW (ref 4.22–5.81)
RDW: 18.5 % — AB (ref 11.5–15.5)
WBC: 7 10*3/uL (ref 4.0–10.5)

## 2015-04-14 LAB — IRON AND TIBC
IRON: 43 ug/dL — AB (ref 45–182)
SATURATION RATIOS: 20 % (ref 17.9–39.5)
TIBC: 216 ug/dL — AB (ref 250–450)
UIBC: 173 ug/dL

## 2015-04-14 LAB — COMPREHENSIVE METABOLIC PANEL
ALBUMIN: 2.4 g/dL — AB (ref 3.5–5.0)
ALT: 26 U/L (ref 17–63)
AST: 41 U/L (ref 15–41)
Alkaline Phosphatase: 69 U/L (ref 38–126)
Anion gap: 18 — ABNORMAL HIGH (ref 5–15)
BUN: 129 mg/dL — AB (ref 6–20)
CHLORIDE: 104 mmol/L (ref 101–111)
CO2: 14 mmol/L — AB (ref 22–32)
CREATININE: 9.1 mg/dL — AB (ref 0.61–1.24)
Calcium: 8.4 mg/dL — ABNORMAL LOW (ref 8.9–10.3)
GFR calc Af Amer: 7 mL/min — ABNORMAL LOW (ref >60)
GFR calc non Af Amer: 6 mL/min — ABNORMAL LOW (ref >60)
GLUCOSE: 154 mg/dL — AB (ref 65–99)
POTASSIUM: 3.8 mmol/L (ref 3.5–5.1)
SODIUM: 136 mmol/L (ref 135–145)
Total Bilirubin: 0.6 mg/dL (ref 0.3–1.2)
Total Protein: 5.8 g/dL — ABNORMAL LOW (ref 6.5–8.1)

## 2015-04-14 LAB — FERRITIN: FERRITIN: 858 ng/mL — AB (ref 24–336)

## 2015-04-14 LAB — GLUCOSE, CAPILLARY
GLUCOSE-CAPILLARY: 130 mg/dL — AB (ref 65–99)
GLUCOSE-CAPILLARY: 114 mg/dL — AB (ref 65–99)
Glucose-Capillary: 142 mg/dL — ABNORMAL HIGH (ref 65–99)
Glucose-Capillary: 126 mg/dL — ABNORMAL HIGH (ref 65–99)
Glucose-Capillary: 129 mg/dL — ABNORMAL HIGH (ref 65–99)

## 2015-04-14 LAB — MAGNESIUM: Magnesium: 2.2 mg/dL (ref 1.7–2.4)

## 2015-04-14 LAB — PHOSPHORUS: Phosphorus: 8.2 mg/dL — ABNORMAL HIGH (ref 2.5–4.6)

## 2015-04-14 MED ORDER — DARBEPOETIN ALFA 100 MCG/0.5ML IJ SOSY
PREFILLED_SYRINGE | INTRAMUSCULAR | Status: AC
  Administered 2015-04-14: 100 ug via INTRAVENOUS
  Filled 2015-04-14: qty 0.5

## 2015-04-14 MED ORDER — FENTANYL 50 MCG/HR TD PT72
50.0000 ug | MEDICATED_PATCH | TRANSDERMAL | Status: DC
  Administered 2015-04-14: 50 ug via TRANSDERMAL
  Filled 2015-04-14: qty 1

## 2015-04-14 MED ORDER — LORAZEPAM 2 MG/ML IJ SOLN
5.0000 mg | Freq: Once | INTRAMUSCULAR | Status: AC
  Administered 2015-04-14: 5 mg via INTRAVENOUS

## 2015-04-14 MED ORDER — METHADONE HCL 10 MG PO TABS
10.0000 mg | ORAL_TABLET | Freq: Four times a day (QID) | ORAL | Status: DC
  Administered 2015-04-14 – 2015-04-15 (×5): 10 mg
  Filled 2015-04-14 (×5): qty 1

## 2015-04-14 MED ORDER — SODIUM CHLORIDE 0.9 % IV SOLN: 100.0000 mL | INTRAVENOUS | Status: DC | PRN

## 2015-04-14 MED ORDER — DOXERCALCIFEROL 4 MCG/2ML IV SOLN
3.0000 ug | INTRAVENOUS | Status: DC
  Filled 2015-04-14: qty 2

## 2015-04-14 MED ORDER — LIDOCAINE HCL (PF) 1 % IJ SOLN: 5.0000 mL | INTRAMUSCULAR | Status: DC | PRN

## 2015-04-14 MED ORDER — LORAZEPAM 2 MG/ML IJ SOLN
INTRAMUSCULAR | Status: AC
Start: 2015-04-14 — End: 2015-04-14
  Filled 2015-04-14: qty 3

## 2015-04-14 MED ORDER — PENTAFLUOROPROP-TETRAFLUOROETH EX AERO: 1.0000 "application " | INHALATION_SPRAY | CUTANEOUS | Status: DC | PRN

## 2015-04-14 MED ORDER — MIDODRINE HCL 5 MG PO TABS
10.0000 mg | ORAL_TABLET | Freq: Three times a day (TID) | ORAL | Status: DC
  Administered 2015-04-14 – 2015-04-15 (×3): 10 mg via ORAL
  Filled 2015-04-14 (×3): qty 2

## 2015-04-14 MED ORDER — DARBEPOETIN ALFA 100 MCG/0.5ML IJ SOSY: 100.0000 ug | PREFILLED_SYRINGE | INTRAMUSCULAR | Status: DC

## 2015-04-14 MED ORDER — HEPARIN SODIUM (PORCINE) 1000 UNIT/ML DIALYSIS: 1000.0000 [IU] | INTRAMUSCULAR | Status: DC | PRN

## 2015-04-14 MED ORDER — METHADONE HCL 10 MG/ML PO CONC: 10.0000 mg | Freq: Four times a day (QID) | ORAL | Status: DC

## 2015-04-14 MED ORDER — DARBEPOETIN ALFA 100 MCG/0.5ML IJ SOSY
100.0000 ug | PREFILLED_SYRINGE | Freq: Once | INTRAMUSCULAR | Status: AC
  Administered 2015-04-14: 100 ug via INTRAVENOUS

## 2015-04-14 MED ORDER — LIDOCAINE-PRILOCAINE 2.5-2.5 % EX CREA: 1.0000 "application " | TOPICAL_CREAM | CUTANEOUS | Status: DC | PRN

## 2015-04-14 MED ORDER — HEPARIN SODIUM (PORCINE) 1000 UNIT/ML DIALYSIS
2000.0000 [IU] | INTRAMUSCULAR | Status: DC | PRN
  Administered 2015-04-14: 2000 [IU] via INTRAVENOUS_CENTRAL

## 2015-04-14 MED ORDER — VALPROATE SODIUM 500 MG/5ML IV SOLN
500.0000 mg | Freq: Three times a day (TID) | INTRAVENOUS | Status: DC
  Administered 2015-04-14 – 2015-04-15 (×3): 500 mg via INTRAVENOUS
  Filled 2015-04-14 (×6): qty 5

## 2015-04-14 MED ORDER — DOXERCALCIFEROL 4 MCG/2ML IV SOLN
INTRAVENOUS | Status: AC
  Administered 2015-04-14: 3 ug via INTRAVENOUS
  Filled 2015-04-14: qty 2

## 2015-04-14 MED ORDER — ALTEPLASE 2 MG IJ SOLR
2.0000 mg | Freq: Once | INTRAMUSCULAR | Status: DC | PRN
  Filled 2015-04-14: qty 2

## 2015-04-14 NOTE — Progress Notes (Signed)
Follow up for G-tube planning.  PLTs continue to improve, 87k today  However, chart review notes pt combative and uncontrollable last pm, attempting to pull out lines and tube. Apparently better this morning, in soft restraints.  IR can move fwd if he can remain non-combative. Concern is not just intra-procedure, also post as would not want pt to pull new G-tube out.  Will check PLTs and status again in am tomorrow.  Brayton El PA-C Interventional Radiology 04/14/2015 12:55 PM

## 2015-04-14 NOTE — Progress Notes (Signed)
Spoke w dr Tyson Alias. He feels pt can go to [facility] on 1-25. Have placed transfer form on shadow chart for md to sign. [facility] and can do dialysis and peg when ready. Sister Kendal Hymen [facility]

## 2015-04-14 NOTE — Progress Notes (Signed)
Nutrition Follow-up  DOCUMENTATION CODES:   Obesity unspecified  INTERVENTION:   Continue Nepro @ 15 ml/hr Continue 60 ml Prostat five times per day  Provides: 1648 kcal, 179 grams protein, and 261 ml H2O.  NUTRITION DIAGNOSIS:   Increased nutrient needs related to chronic illness as evidenced by estimated needs. Ongoing.   GOAL:   Provide needs based on ASPEN/SCCM guidelines Met.   MONITOR:   TF tolerance, Skin, I & O's, Vent status, Labs  ASSESSMENT:   55 yo MO AAF non compliant with HD and Cpap and re presents with hypercarbic and hypoxic resp failure. He missed 1 dose of HD after discharge before re-presenting to hospital and being intubated. Opacities on his CXR imaging are concerning for possible pneumonia and given his history of intubation & exposure HCAP is certainly possible.   Patient is currently intubated on ventilator support MV: 18.2 L/min Temp (24hrs), Avg:98.4 F (36.9 C), Min:97.4 F (36.3 C), Max:99.5 F (37.5 C)  1/20 trach No PEG yet, per RN may get it today.  + agitation Labs reviewed: phosphorus elevated 8.2 CBG's: 126-142 Weight increased, HD to compensate 1/21 CRRT stopped  Diet Order:  Diet NPO time specified  Skin:  Wound (see comment) (Stage II sacrum, noted 1/24)  Last BM:  1/23  Height:   Ht Readings from Last 1 Encounters:  04/11/15 '6\' 2"'  (1.88 m)   Weight:   Wt Readings from Last 1 Encounters:  04/14/15 306 lb 7 oz (139 kg)   Ideal Body Weight:  86.36 kg  BMI:  Body mass index is 39.33 kg/(m^2).  Estimated Nutritional Needs:   Kcal:  6767-2094  Protein:  >172 grams  Fluid:  1.2 L/day  EDUCATION NEEDS:   No education needs identified at this time  [place], [place], [place] Pager [phone] After Hours Pager

## 2015-04-14 NOTE — Progress Notes (Addendum)
[facility] Progress Note   Subjective: gained another 3kg w 3-4L fluids and now has pulm edema on CXR. CVP up to 19-23  Filed Vitals:   04/14/15 0945 04/14/15 1000 04/14/15 1015 04/14/15 1030  BP: 88/60 92/67 105/73 112/77  Pulse: 67 66 65 65  Temp:      TempSrc:      Resp: '30 30 30 30  ' Height:      Weight:      SpO2: 97% 99% 98% 98%    Inpatient medications: . antiseptic oral rinse  7 mL Mouth Rinse 10 times per day  . chlorhexidine gluconate  15 mL Mouth Rinse BID  . doxercalciferol  3 mcg Intravenous Q T,Th,Sat-1800  . feeding supplement (NEPRO CARB STEADY)  1,000 mL Per Tube Q24H  . feeding supplement (PRO-STAT SUGAR FREE 64)  60 mL Per Tube 5 X Daily  . hydrocortisone sod succinate (SOLU-CORTEF) inj  50 mg Intravenous Q6H  . LORazepam  2 mg Intravenous Q6H  . multivitamin  1 tablet Oral QHS  . pantoprazole sodium  40 mg Per Tube QHS  . QUEtiapine  200 mg Per Tube Daily  . sennosides  5 mL Oral BID  . valproate sodium  250 mg Intravenous TID   . sodium chloride 10 mL/hr at 04/13/15 2331  . dexmedetomidine 1.2 mcg/kg/hr (04/14/15 1027)  . fentaNYL infusion INTRAVENOUS 200 mcg/hr (04/14/15 0600)  . phenylephrine (NEO-SYNEPHRINE) Adult infusion Stopped (04/14/15 0530)   sodium chloride, sodium chloride, albuterol, alteplase, heparin, [START ON 04/15/2015] heparin, lidocaine (PF), lidocaine-prilocaine, midazolam, pentafluoroprop-tetrafluoroeth, sodium chloride  Exam: On vent, w trach Opens eyes  Chest clear bilat RRR no mrg Abd obese soft ntnd No LE or UE edema R IJ cath  [facility] [place]  4.5h   400/800  138kg  2K/3.5 bath  R IJ cath  Hep 4200 Hect 2 ug Mircera 75 ug q 2wks  Midodrine 10 mg pre HD      Assessment: 1 Vol excess - new pulm edema today, CVP ^, 3kg over dry wt, +3-4 L fluid per day due to mult drips 2 A/C resp failure s/p trach3 Agitation/ sedation - on multiple drips and meds for sedation, combative 3 ESRD sp CRRT 4 Bivent HF LV 30%/ RV  dysfxn/ PAH 5 MBD Ca/P stable 6 Anemia Hb down 7.9, give darbe 100 / wk and check fe/tibc 7 Met acidosis - will improve w HD 8 Hypotension - will add midodrine per NG  Plan - HD today, and again tomorrow due to sig daily volume load. Midodrine   Kelly Splinter MD [facility] pager [phone]    cell [phone] 04/14/2015, 10:43 AM    Recent Labs Lab 04/12/15 0425 04/13/15 0500 04/14/15 0515  NA 141 137 136  K 2.8* 3.4* 3.8  CL 108 105 104  CO2 17* 16* 14*  GLUCOSE 102* 129* 154*  BUN 49* 89* 129*  CREATININE 4.31* 6.77* 9.10*  CALCIUM 8.9 8.8* 8.4*  PHOS 3.5 6.2* 8.2*    Recent Labs Lab 04/12/15 0425 04/13/15 0500 04/14/15 0515  AST  --   --  41  ALT  --   --  26  ALKPHOS  --   --  69  BILITOT  --   --  0.6  PROT  --   --  5.8*  ALBUMIN 2.5* 2.4* 2.4*    Recent Labs Lab 04/10/15 0436  04/11/15 0427  04/11/15 1211 04/13/15 0500 04/14/15 0515  WBC 11.0*  --  11.6*  --   --  6.7 7.0  NEUTROABS 8.1*  --   --   --   --   --   --   HGB 9.1*  < > 10.3*  12.6*  < > 11.2* 8.7* 7.9*  HCT 29.9*  < > 31.7*  37.0*  < > 33.0* 27.2* 24.9*  MCV 95.8  --  93.2  --   --  91.3 90.2  PLT 69*  --  76*  --   --  81* 87*  < > = values in this interval not displayed.

## 2015-04-14 NOTE — Progress Notes (Signed)
PULMONARY / CRITICAL CARE MEDICINE   Name: Gary Rivera MRN: [id number] DOB: 06-28-1960    ADMISSION DATE:  03/29/2015 CONSULTATION DATE:  03/29/15  REFERRING MD :  EDP PRIMARY SERVICE: PCCM   BRIEF PATIENT DESCRIPTION: 55 yo M with ESRD noncompliant with HD who presented with acute hypercapnic/hypoxic respiratory failure requiring intubation and later tracheostomy.   SIGNIFICANT EVENTS / STUDIES:  1/8 Admitted with respiratory failure requiring intubation  1/9 Extubated  1/11 Re-intubated 1/12 CT chest with bibasilar atelectasis  1/12 CT neck with basilar consolidation with air bronchograms 1/13 Hypothermia on CCVHD 1/16 Started on neo 1/16 Restarted on solu-cortef 1/18 ETT changed due to cuff leak  1/20 Tracheostomy  1/21 Enema, agitated, seroquel increased, precedex and dilaudid added 1/21 Continues to be agitated, max'd on versed/precedex/dilaudid 1/22 Added scheduled haldol, no longer on versed infusion   1/23 Dc'd dilaudid, started valproate , ativan, and versed PRN 1/24 Off neo   LINES / TUBES: 1/8 ETT>>01/09>>>1/11>>>1/20 trach>>> 1/11 [facility] CVL  >>   CULTURES: 1/8 Blood x 2 >> neg 1/8 Urine >> not done 1/12 Sputum >> Oral flora 1/11 RVP >> neg  ANTIBIOTICS: 1/8 vanc >> 1/12 1/8 zoysn >> 1/12    SUBJECTIVE: Per nurse still very agitated and combative at times max'd out on  precedex infusion and on scheduled ativan and valproate. Did not required PRN versed overnight.  Needs soft wrist restraints. Off neo with stable BP. Did not get PEG yesterday due to thrombocytopenia.    VITAL SIGNS: Temp:  [97.4 F (36.3 C)-99.5 F (37.5 C)] 97.4 F (36.3 C) (01/24 0800) Pulse Rate:  [30-127] 66 (01/24 1000) Resp:  [24-31] 30 (01/24 1000) BP: (65-123)/(45-83) 92/67 mmHg (01/24 1000) SpO2:  [90 %-99 %] 99 % (01/24 1000) FiO2 (%):  [40 %] 40 % (01/24 0758) Weight:  [311 lb 4.6 oz (141.2 kg)] 311 lb 4.6 oz (141.2 kg) (01/24 0345) HEMODYNAMICS: CVP:  [12 mmHg-23  mmHg] 23 mmHg VENTILATOR SETTINGS: Vent Mode:  [-] PRVC FiO2 (%):  [40 %] 40 % Set Rate:  [30 bmp-60 bmp] 30 bmp Vt Set:  [600 mL] 600 mL PEEP:  [5 cmH20] 5 cmH20 Plateau Pressure:  [21 cmH20-27 cmH20] 27 cmH20 INTAKE / OUTPUT: Intake/Output      01/23 0701 - 01/24 0700 01/24 0701 - 01/25 0700   P.O.     I.V. (mL/kg) 1689.6 (12)    NG/GT 820.8    IV Piggyback 207.5    Total Intake(mL/kg) 2717.9 (19.2)    Net +2717.9          Stool Occurrence 3 x      PHYSICAL EXAMINATION: General:  Semi-alert on vent Neuro:  RAAS +1 HEENT:  OG tube Cardiovascular:  Normal rate and rhythm  Lungs:  Trach in place. Anterior fields clear.  Abdomen:  Soft, non-tender, non-distended, normal BS Musculoskeletal: SCD's, trace edema  Skin:  No rashes   LABS:  CBC  Recent Labs Lab 04/11/15 0427  04/11/15 1211 04/13/15 0500 04/14/15 0515  WBC 11.6*  --   --  6.7 7.0  HGB 10.3*  12.6*  < > 11.2* 8.7* 7.9*  HCT 31.7*  37.0*  < > 33.0* 27.2* 24.9*  PLT 76*  --   --  81* 87*  < > = values in this interval not displayed. Coag's  Recent Labs Lab 04/13/15 0500  APTT 32  INR 1.42   BMET  Recent Labs Lab 04/12/15 0425 04/13/15 0500 04/14/15 0515  NA 141 137  136  K 2.8* 3.4* 3.8  CL 108 105 104  CO2 17* 16* 14*  BUN 49* 89* 129*  CREATININE 4.31* 6.77* 9.10*  GLUCOSE 102* 129* 154*   Electrolytes  Recent Labs Lab 04/12/15 0424 04/12/15 0425 04/13/15 0500 04/14/15 0515  CALCIUM  --  8.9 8.8* 8.4*  MG 1.8  --  2.0 2.2  PHOS 3.5 3.5 6.2* 8.2*   Sepsis Markers No results for input(s): LATICACIDVEN, PROCALCITON, O2SATVEN in the last 168 hours. ABG  Recent Labs Lab 04/11/15 0325  PHART 7.283*  PCO2ART 35.2  PO2ART 73.0*   Liver Enzymes  Recent Labs Lab 04/12/15 0425 04/13/15 0500 04/14/15 0515  AST  --   --  41  ALT  --   --  26  ALKPHOS  --   --  69  BILITOT  --   --  0.6  ALBUMIN 2.5* 2.4* 2.4*   Cardiac Enzymes No results for input(s): TROPONINI,  PROBNP in the last 168 hours. Glucose  Recent Labs Lab 04/13/15 1213 04/13/15 1645 04/13/15 1952 04/13/15 2328 04/14/15 0423 04/14/15 0817  GLUCAP 120* 124* 114* 150* 130* 142*    Imaging Dg Chest Port 1 View  04/14/2015  CLINICAL DATA:  Tracheotomy.  Shortness of breath. EXAM: PORTABLE CHEST 1 VIEW COMPARISON:  04/13/2015. FINDINGS: Endotracheal tube, dialysis catheter, left IJ line, NG tube in stable position. Persistent dense bibasilar atelectasis and/or infiltrates. No interim change. Cardiomegaly with pulmonary vascular congestion. Small left pleural effusion cannot be excluded . No pneumothorax. IMPRESSION: 1. Lines and tubes stable position. 2. Persistent dense bibasilar atelectasis and/or infiltrates. 3. Persistent cardiomegaly and pulmonary vascular congestion. Small left pleural effusion cannot be excluded . Electronically Signed   By: Maisie Fus  Register   On: 04/14/2015 07:11   Dg Chest Port 1 View  04/13/2015  CLINICAL DATA:  Tracheostomy. EXAM: PORTABLE CHEST 1 VIEW COMPARISON:  04/11/2015 . FINDINGS: Tracheostomy, dialysis catheter, left IJ line stable position. Stable dense bibasilar atelectasis. Cardiomegaly with mild pulmonary vascular congestion. Small left pleural effusion cannot be excluded. IMPRESSION: 1. Lines and tubes in stable position. 2. Dense bibasilar atelectasis, unchanged from prior exam. 3. Cardiomegaly with mild pulmonary vascular congestion. Small left pleural effusion cannot be excluded . 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%) AI P:  Keep MAP >55  Taper hydrocortisone 50 mg q6h since off pressors, not there yet Midodrine noted For 3 liters off, neo may be re needed  RENAL A:  ESRD on HD with history of noncompliance  Hypokalemia - resolved Hyperphosphatemia  Hypernatremia - resolved  Hypomagnesemia - resolved  P:   Nephrology following  HD today  For 3 liters Monitor renal function  GASTROINTESTINAL A: Nutrition GI PPx Nutrition  Constipation  P:   Tube feeds PEG placement on hold  Protonix 40 mg daily  Senokot BID  No magnesium or phosphate based enemas in setting of ESRD  HEMATOLOGIC A:  Chronic anemia  of renal disease Thrombocytopenia - no active bleeding  DVT Ppx P:  Monitor CBC Consider ESA if Hg<10 SCD's  INFECTIOUS A: Mild leukocytosis - resolved  Left antecubital wound  P:   Monitor CBC and fever curve   ENDOCRINE A:  RAI - AM cortisol 8 on 1/8 Secondary hyperparathyroidism of renal origin  P:   Taper hydrocortisone 50 q 6hr since off pressors Monitor blood glucose Start SSI if CBG>180 Hectorol TTS  NEUROLOGIC A: Refractory delirium  Anxiety  P:   Wean precedex and fentanyl infusions Ativan 2 Q 6 hr and versed 1 mg Q2 hr PRN Soft restraints if needed  RAAS goal 0  IV valproate 250 TID, increase Seroquel 200 mg daily Daily WUA  qtc wnl, add methadone   Otis Brace MD PGY-3 IMTS Pager [phone]   04/14/2015, 10:18 AM   STAFF NOTE: Cindi Carbon, MD FACP have personally reviewed patient's available data, including medical history, events of note, physical examination and test results as part of my evaluation. I have discussed with resident/NP and other care providers such as pharmacist, RN and RRT. In addition, I personally evaluated patient and elicited key findings of: remains on vent, remains on precedex and wanting to body  slam and harm RN, coarse BS, refractory delirium noted, for 3 lioters off, SBT okay but will need PS 15-20 likely , increase depakote, add methadone and check qtc prior, noted ESRD, dose adjust methadone down, increase freq ativan may be needed, goal to dc fent drip, add patch also, goal to dc precedex with above recs The patient is critically ill with multiple organ systems failure and requires high complexity decision making for assessment and support, frequent evaluation and titration of therapies, application of advanced monitoring technologies and extensive interpretation of multiple databases.   Critical Care Time devoted to patient care services described in this note is30 Minutes. This time reflects time of care of this signee: Rory Percy, MD FACP. This critical care time does not reflect procedure time, or teaching time or supervisory time of PA/NP/Med student/Med Resident etc but could involve care discussion time. Rest per NP/medical resident whose note is outlined above and that I agree with   Mcarthur Rossetti. Tyson Alias, MD, FACP Pgr: [phone]  Pulmonary & Critical Care 04/14/2015 11:38 AM

## 2015-04-14 NOTE — Progress Notes (Signed)
Patient attempting to kick and punch nurses. Patient tries to bite nurses hand when RN tries to adjust patient's pillow.  MD notified. Orders received, will continue to monitor closely.

## 2015-04-14 NOTE — Procedures (Signed)
  I was present at this dialysis session, have reviewed the session itself and made  appropriate changes [name] MD [facility] pager [phone]    cell [phone] 04/14/2015, 11:11 AM

## 2015-04-14 NOTE — Progress Notes (Signed)
Arrived to patient room 2H-14 at 0910.  Reviewed treatment plan and this RN agrees with plan.  Report received from bedside RN, Denny Peon.  Consent verified.  Patient trached, vented, sedated.   Lung sounds clear to ausculation in all fields. Generalized edema. Cardiac:  Regular R&R.  Removed caps and cleansed RIJ catheter with chlorhedxidine.  Aspirated ports of heparin and flushed them with saline per protocol.  Connected and secured lines, initiated treatment at 0945.  UF Goal of and net fluid removal 3L.  Will continue to monitor.

## 2015-04-14 NOTE — Progress Notes (Signed)
Dialysis treatment completed.  3000 mL ultrafiltrated.  2500 mL net fluid removal.  Patient trached, ventilated and sedated. Lung sounds clear to ausculation in all fields. Generalized BUE edema. Cardiac: Regular R&R.  Cleansed RIJ catheter with chlorhexidine.  Disconnected lines and flushed ports with saline per protocol.  Ports locked with heparin and capped per protocol.    Report given to bedside, RN Erin.

## 2015-04-15 ENCOUNTER — Inpatient Hospital Stay
Admission: AD | Admit: 2015-04-15 | Discharge: 2015-06-09 | Disposition: A | Discharge status: Skilled Nursing Facility | Payer: Medicare Other | Source: Ambulatory Visit | Attending: Internal Medicine | Admitting: Internal Medicine

## 2015-04-15 ENCOUNTER — Other Ambulatory Visit (HOSPITAL_COMMUNITY): Payer: Self-pay

## 2015-04-15 DIAGNOSIS — R0902 Hypoxemia: Secondary | ICD-10-CM

## 2015-04-15 DIAGNOSIS — R1310 Dysphagia, unspecified: Secondary | ICD-10-CM | POA: Insufficient documentation

## 2015-04-15 DIAGNOSIS — J9620 Acute and chronic respiratory failure, unspecified whether with hypoxia or hypercapnia: Secondary | ICD-10-CM | POA: Diagnosis present

## 2015-04-15 DIAGNOSIS — J9690 Respiratory failure, unspecified, unspecified whether with hypoxia or hypercapnia: Secondary | ICD-10-CM

## 2015-04-15 DIAGNOSIS — Z931 Gastrostomy status: Secondary | ICD-10-CM

## 2015-04-15 DIAGNOSIS — Z930 Tracheostomy status: Secondary | ICD-10-CM

## 2015-04-15 DIAGNOSIS — Z4659 Encounter for fitting and adjustment of other gastrointestinal appliance and device: Secondary | ICD-10-CM

## 2015-04-15 LAB — HEPATIC FUNCTION PANEL
ALBUMIN: 2.3 g/dL — AB (ref 3.5–5.0)
ALK PHOS: 61 U/L (ref 38–126)
ALT: 17 U/L (ref 17–63)
AST: 33 U/L (ref 15–41)
BILIRUBIN TOTAL: 0.9 mg/dL (ref 0.3–1.2)
Bilirubin, Direct: 0.3 mg/dL (ref 0.1–0.5)
Indirect Bilirubin: 0.6 mg/dL (ref 0.3–0.9)
Total Protein: 5.8 g/dL — ABNORMAL LOW (ref 6.5–8.1)

## 2015-04-15 LAB — BLOOD GAS, ARTERIAL
ACID-BASE EXCESS: 0.7 mmol/L (ref 0.0–2.0)
BICARBONATE: 24.3 meq/L — AB (ref 20.0–24.0)
FIO2: 0.5
LHR: 30 {breaths}/min
O2 SAT: 87.9 %
PATIENT TEMPERATURE: 98.6
PCO2 ART: 35.5 mmHg (ref 35.0–45.0)
PEEP/CPAP: 5 cmH2O
PH ART: 7.45 (ref 7.350–7.450)
PO2 ART: 59.1 mmHg — AB (ref 80.0–100.0)
TCO2: 25.4 mmol/L (ref 0–100)
VT: 600 mL

## 2015-04-15 LAB — RENAL FUNCTION PANEL
ALBUMIN: 2.3 g/dL — AB (ref 3.5–5.0)
ANION GAP: 15 (ref 5–15)
BUN: 75 mg/dL — ABNORMAL HIGH (ref 6–20)
CO2: 20 mmol/L — ABNORMAL LOW (ref 22–32)
Calcium: 8.3 mg/dL — ABNORMAL LOW (ref 8.9–10.3)
Chloride: 100 mmol/L — ABNORMAL LOW (ref 101–111)
Creatinine, Ser: 6.1 mg/dL — ABNORMAL HIGH (ref 0.61–1.24)
GFR, EST AFRICAN AMERICAN: 11 mL/min — AB (ref >60)
GFR, EST NON AFRICAN AMERICAN: 9 mL/min — AB (ref >60)
GLUCOSE: 147 mg/dL — AB (ref 65–99)
PHOSPHORUS: 5.2 mg/dL — AB (ref 2.5–4.6)
POTASSIUM: 3.3 mmol/L — AB (ref 3.5–5.1)
Sodium: 135 mmol/L (ref 135–145)

## 2015-04-15 LAB — CBC
HEMATOCRIT: 23.3 % — AB (ref 39.0–52.0)
HEMOGLOBIN: 7.7 g/dL — AB (ref 13.0–17.0)
MCH: 29.3 pg (ref 26.0–34.0)
MCHC: 33 g/dL (ref 30.0–36.0)
MCV: 88.6 fL (ref 78.0–100.0)
Platelets: 81 10*3/uL — ABNORMAL LOW (ref 150–400)
RBC: 2.63 MIL/uL — AB (ref 4.22–5.81)
RDW: 18.1 % — ABNORMAL HIGH (ref 11.5–15.5)
WBC: 5.9 10*3/uL (ref 4.0–10.5)

## 2015-04-15 LAB — GLUCOSE, CAPILLARY
GLUCOSE-CAPILLARY: 134 mg/dL — AB (ref 65–99)
GLUCOSE-CAPILLARY: 146 mg/dL — AB (ref 65–99)
Glucose-Capillary: 120 mg/dL — ABNORMAL HIGH (ref 65–99)
Glucose-Capillary: 121 mg/dL — ABNORMAL HIGH (ref 65–99)

## 2015-04-15 LAB — PROTIME-INR
INR: 2.03 — ABNORMAL HIGH (ref 0.00–1.49)
Prothrombin Time: 22.8 seconds — ABNORMAL HIGH (ref 11.6–15.2)

## 2015-04-15 MED ORDER — SODIUM CHLORIDE 0.9 % IJ SOLN: 10.0000 mL | INTRAMUSCULAR | Status: AC | PRN

## 2015-04-15 MED ORDER — SODIUM CHLORIDE 0.9 % IV SOLN: 10.0000 ug/h | INTRAVENOUS | Status: AC

## 2015-04-15 MED ORDER — HYDROCORTISONE NA SUCCINATE PF 100 MG IJ SOLR: 50.0000 mg | Freq: Every day | INTRAMUSCULAR | Status: DC

## 2015-04-15 MED ORDER — HYDROCORTISONE NA SUCCINATE PF 100 MG IJ SOLR: 50.0000 mg | Freq: Two times a day (BID) | INTRAMUSCULAR | Status: AC

## 2015-04-15 MED ORDER — PANTOPRAZOLE SODIUM 40 MG PO PACK: 40.0000 mg | PACK | Freq: Every day | ORAL | Status: AC

## 2015-04-15 MED ORDER — NEPRO/CARBSTEADY PO LIQD: 1000.0000 mL | ORAL | Status: AC

## 2015-04-15 MED ORDER — MIDAZOLAM HCL 2 MG/2ML IJ SOLN: 1.0000 mg | INTRAMUSCULAR | Status: AC | PRN

## 2015-04-15 MED ORDER — QUETIAPINE FUMARATE 200 MG PO TABS: 200.0000 mg | ORAL_TABLET | Freq: Every day | ORAL | Status: AC

## 2015-04-15 MED ORDER — DARBEPOETIN ALFA 100 MCG/0.5ML IJ SOSY: 100.0000 ug | PREFILLED_SYRINGE | INTRAMUSCULAR | Status: AC

## 2015-04-15 MED ORDER — SODIUM CHLORIDE 0.9 % IV SOLN: 100.0000 mL | INTRAVENOUS | Status: AC | PRN

## 2015-04-15 MED ORDER — ALBUTEROL SULFATE (2.5 MG/3ML) 0.083% IN NEBU: 2.5000 mg | INHALATION_SOLUTION | RESPIRATORY_TRACT | Status: AC | PRN

## 2015-04-15 MED ORDER — DEXTROSE 5 % IV SOLN: 500.0000 mg | Freq: Three times a day (TID) | INTRAVENOUS | Status: AC

## 2015-04-15 MED ORDER — DOXERCALCIFEROL 4 MCG/2ML IV SOLN: 3.0000 ug | INTRAVENOUS | Status: AC

## 2015-04-15 MED ORDER — SENNOSIDES 8.8 MG/5ML PO SYRP: 5.0000 mL | ORAL_SOLUTION | Freq: Two times a day (BID) | ORAL | Status: AC

## 2015-04-15 MED ORDER — LORAZEPAM 2 MG/ML IJ SOLN: 2.0000 mg | Freq: Four times a day (QID) | INTRAMUSCULAR | Status: AC

## 2015-04-15 MED ORDER — CHLORHEXIDINE GLUCONATE 0.12% ORAL RINSE (MEDLINE KIT): 15.0000 mL | Freq: Two times a day (BID) | OROMUCOSAL | Status: AC

## 2015-04-15 MED ORDER — DEXMEDETOMIDINE HCL IN NACL 400 MCG/100ML IV SOLN: 0.4000 ug/kg/h | INTRAVENOUS | Status: AC

## 2015-04-15 MED ORDER — FENTANYL 75 MCG/HR TD PT72: 75.0000 ug | MEDICATED_PATCH | TRANSDERMAL | Status: AC

## 2015-04-15 MED ORDER — SODIUM CHLORIDE 0.9 % IV SOLN: 10.0000 mL | INTRAVENOUS | Status: AC

## 2015-04-15 MED ORDER — FENTANYL 50 MCG/HR TD PT72: 75.0000 ug | MEDICATED_PATCH | TRANSDERMAL | Status: DC

## 2015-04-15 MED ORDER — HYDROCORTISONE NA SUCCINATE PF 100 MG IJ SOLR: 50.0000 mg | Freq: Two times a day (BID) | INTRAMUSCULAR | Status: DC

## 2015-04-15 MED ORDER — PRO-STAT SUGAR FREE PO LIQD: 60.0000 mL | Freq: Every day | ORAL | Status: AC

## 2015-04-15 MED ORDER — MIDODRINE HCL 10 MG PO TABS: 10.0000 mg | ORAL_TABLET | Freq: Three times a day (TID) | ORAL | Status: AC

## 2015-04-15 MED ORDER — HYDROCORTISONE NA SUCCINATE PF 100 MG IJ SOLR: 50.0000 mg | Freq: Every day | INTRAMUSCULAR | Status: AC

## 2015-04-15 NOTE — Care Management Important Message (Signed)
Important Message  Patient Details  Name: Gary Rivera MRN: [id number] Date of Birth: 04-10-1960   Medicare Important Message Given:  Yes    Caterra Ostroff P Socrates Cahoon 04/15/2015, 11:05 AM

## 2015-04-15 NOTE — Progress Notes (Signed)
RN tried to bathe patient, patient very agitated, kicking even after prn versed. Will continue to monitor closely.

## 2015-04-15 NOTE — Progress Notes (Signed)
Patient ID: Gary Rivera, male   DOB: 12/13/1960, 55 y.o.   MRN: [id number] Spoke to RN, patient on versed and still combative, punching, hitting, and trying to bite people.    Platelets are 81K today  Cont to hold g-tube placement due to combativeness.  Patient going to [facility] today per RN.  Will follow.  [name] E 10:10 AM 04/15/2015 [phone]

## 2015-04-15 NOTE — Discharge Summary (Signed)
Name: Gary Rivera MRN: [id number] DOB: May 17, 1960 55 y.o. PCP: [name], MD  Date of Admission: 03/29/2015 11:25 AM Date of Discharge: 04/15/2015 Attending Physician: Dr. Rory Percy   Discharge Diagnosis:  Acute on chronic hypoxemic/hypercapnic respiratory failure in setting of pulmonary edema s/p tracheostomy Refractory agitation and psychosis Hypotension  Left antecubital wound infection  ESRD with history of noncompliance with HD  Acute on chronic anemia of renal disease      Discharge Medications:   Medication List    STOP taking these medications        albuterol 108 (90 Base) MCG/ACT inhaler  Commonly known as:  PROVENTIL HFA;VENTOLIN HFA  Replaced by:  albuterol (2.5 MG/3ML) 0.083% nebulizer solution     amitriptyline 25 MG tablet  Commonly known as:  ELAVIL     clonazePAM 0.5 MG tablet  Commonly known as:  KLONOPIN     lanthanum 1000 MG chewable tablet  Commonly known as:  FOSRENOL     metoCLOPramide 10 MG tablet  Commonly known as:  REGLAN     Omega-3 1000 MG Caps     omeprazole 20 MG capsule  Commonly known as:  PRILOSEC     OXYGEN     traMADol 50 MG tablet  Commonly known as:  ULTRAM     vitamin E 400 UNIT capsule      TAKE these medications        albuterol (2.5 MG/3ML) 0.083% nebulizer solution  Commonly known as:  PROVENTIL  Take 3 mLs (2.5 mg total) by nebulization every 3 (three) hours as needed for wheezing.     chlorhexidine gluconate 0.12 % solution  Commonly known as:  PERIDEX  15 mLs by Mouth Rinse route 2 (two) times daily.     Darbepoetin Alfa 100 MCG/0.5ML Sosy injection  Commonly known as:  ARANESP  Inject 0.5 mLs (100 mcg total) into the vein every Wednesday with hemodialysis.  Start taking on:  04/22/2015     dexmedetomidine 400 MCG/100ML Soln  Commonly known as:  PRECEDEX  Inject 53.6-160.8 mcg/hr into the vein continuous.     doxercalciferol 4 MCG/2ML injection  Commonly known as:  HECTOROL  Inject 1.5  mLs (3 mcg total) into the vein Every Tuesday,Thursday,and Saturday with dialysis.     feeding supplement (NEPRO CARB STEADY) Liqd  Place 1,000 mLs into feeding tube daily.     feeding supplement (PRO-STAT SUGAR FREE 64) Liqd  Place 60 mLs into feeding tube 5 (five) times daily.     fentaNYL 2,500 mcg in sodium chloride 0.9 % 200 mL  Inject 10 mcg/hr into the vein continuous.     fentaNYL 75 MCG/HR  Commonly known as:  DURAGESIC - dosed mcg/hr  Place 1 patch (75 mcg total) onto the skin every 3 (three) days.  Start taking on:  04/17/2015     hydrocortisone sodium succinate 100 MG Solr injection  Commonly known as:  SOLU-CORTEF  Inject 1 mL (50 mg total) into the vein every 12 (twelve) hours.     hydrocortisone sodium succinate 100 MG Solr injection  Commonly known as:  SOLU-CORTEF  Inject 1 mL (50 mg total) into the vein daily.  Start taking on:  04/18/2015     LORazepam 2 MG/ML injection  Commonly known as:  ATIVAN  Inject 1 mL (2 mg total) into the vein every 6 (six) hours.     midazolam 2 MG/2ML Soln injection  Commonly known as:  VERSED  Inject 1 mL (1  mg total) into the vein every 2 (two) hours as needed for agitation.     midodrine 10 MG tablet  Commonly known as:  PROAMATINE  Take 1 tablet (10 mg total) by mouth 3 (three) times daily before meals.     multivitamin Tabs tablet  Take 1 tablet by mouth at bedtime.     pantoprazole sodium 40 mg/20 mL Pack  Commonly known as:  PROTONIX  Place 20 mLs (40 mg total) into feeding tube at bedtime.     QUEtiapine 200 MG tablet  Commonly known as:  SEROQUEL  Place 1 tablet (200 mg total) into feeding tube daily.     sennosides 8.8 MG/5ML syrup  Commonly known as:  SENOKOT  Take 5 mLs by mouth 2 (two) times daily.     sodium chloride 0.9 % injection  10-40 mLs by Intracatheter route as needed (flush).     sodium chloride 0.9 % infusion  Inject 100 mLs into the vein as needed (symptomatic hypotension or decrease in SBP  < 90 mmHg).     sodium chloride 0.9 % infusion  Inject 100 mLs into the vein as needed (severe cramping).     sodium chloride 0.9 % infusion  Inject 10 mLs into the vein continuous.     valproate 500 mg in dextrose 5 % 50 mL  Inject 500 mg into the vein 3 (three) times daily.        Disposition and follow-up:   [name] was discharged from [facility] in Good condition.  At the hospital follow up visit please address:  1.  Severe refractory agitation/psychosis - on precedex and fentanyl gtt, fentanyl patch, ativan scheduled,  seroquel, and newly started valproate. Needed restraints. Need to monitor QTc.  Possibly steroid-induced, taper to end on 1/29       Hypotension - no longer on pressors since 1/25, solucortef taper to end 1/29, newly started on midodrine        Monitor for constipation - avoid phosphate or magnesium based enemas      S/p trach for chronic respiratory failure - wean as tolearatd      Probable OSA - needs outpatient sleep study       Needs PEG tube once less agitated that will be performed by IR         ESRD on HD  - per renal   2.  Labs / imaging needed at time of follow-up: None  3.  Pending labs/ test needing follow-up: None  Follow-up Appointments:   Discharge Instructions:   Consultations: Treatment Team:  Camille Bal, MD [name], MD  Procedures Performed:  Dg Chest 2 View  04/01/2015  CLINICAL DATA:  Shortness of breath, confusion EXAM: CHEST  2 VIEW COMPARISON:  03/31/2015 FINDINGS: Cardiomegaly again noted. Central mild vascular congestion and mild perihilar interstitial prominence probable residual mild interstitial edema. Left IJ central line has been removed. Dual lumen right IJ catheter again noted. Again noted chronic elevation of the right hemidiaphragm with right basilar atelectasis. Persistent streaky atelectasis or infiltrate left base retrocardiac best seen on lateral view. IMPRESSION: Central  mild vascular congestion and mild perihilar interstitial prominence probable residual mild interstitial edema. Left IJ central line has been removed. Dual lumen right IJ catheter again noted. Again noted chronic elevation of the right hemidiaphragm with right basilar atelectasis. Persistent streaky atelectasis or infiltrate left base retrocardiac best seen on lateral view. Electronically Signed   By: Lanette Hampshire.D.  On: 04/01/2015 10:58   Ct Soft Tissue Neck W Contrast  04/02/2015  CLINICAL DATA:  Acute respiratory failure. Cardiac arrest. End-stage renal disease. Difficult intubation. Large epiglottis is reported. EXAM: CT NECK WITH CONTRAST TECHNIQUE: Multidetector CT imaging of the neck was performed using the standard protocol following the bolus administration of intravenous contrast. CONTRAST:  71mL OMNIPAQUE IOHEXOL 300 MG/ML  SOLN COMPARISON:  None. FINDINGS: An endotracheal tube and an orogastric tube have been placed. The endotracheal tube appears to be in good position. The orogastric tube is in the esophagus. The pharyngeal soft tissues are diffusely swollen. There is no parapharyngeal inflammatory process, but the presence or absence of epiglottitis or mucosal lesion is not established. Difficult to confirm or exclude tonsillar or adenoidal enlargement. The endotracheal tube crosses the larynx, and there is no visible laryngeal abnormality. No pathologic adenopathy is observed. BILATERAL internal jugular catheters are noted, likely a dialysis catheter on the RIGHT and a central venous line on the LEFT. No pneumothorax is evident. CT chest reported separately. Layering fluid is seen in the sphenoid sinus and LEFT maxillary sinus. No visible orbital abnormality. Major and minor salivary glands appear unremarkable. Cervical spondylosis but no osseous finding of acuity. IMPRESSION: Post intubation findings as described. The pharyngeal soft tissues are diffusely swollen but this could reflect recent  intubation; there is no visible discrete inflammatory process in the pharynx or in the parapharyngeal space. Electronically Signed   By: Elsie Stain M.D.   On: 04/02/2015 17:10   Ct Chest W Contrast  04/02/2015  CLINICAL DATA:  Acute respiratory failure. Question pneumonia. History of end-stage renal disease. EXAM: CT CHEST WITH CONTRAST TECHNIQUE: Multidetector CT imaging of the chest was performed during intravenous contrast administration. CONTRAST:  80 mL OMNIPAQUE IOHEXOL 300 MG/ML  SOLN COMPARISON:  Multiple prior plain films of the chest, last 03/31/2015, 04/01/2015 and 04/02/2015 FINDINGS: Endotracheal tube and right IJ catheter are seen and in good position. NG tube is also in place coursing into the stomach. The tip is below the inferior margin of the exam. There is cardiomegaly. No pericardial pleural effusion. No axillary, hilar or mediastinal lymphadenopathy. The lungs demonstrate dependent airspace disease bilaterally, worse on the right. A few air bronchograms are seen in the lower lobes bilaterally. The lungs are otherwise unremarkable. Incidentally imaged upper abdomen shows a tiny stone within the gallbladder. Visualized intra-abdominal contents are otherwise unremarkable. IMPRESSION: Right worse than left dependent airspace disease has an appearance most suggestive of atelectasis rather than pneumonia. Marked cardiomegaly. Gallstones without evidence of cholecystitis. Electronically Signed   By: Drusilla Kanner M.D.   On: 04/02/2015 17:09   Dg Chest Port 1 View  04/14/2015  CLINICAL DATA:  Tracheotomy.  Shortness of breath. EXAM: PORTABLE CHEST 1 VIEW COMPARISON:  04/13/2015. FINDINGS: Endotracheal tube, dialysis catheter, left IJ line, NG tube in stable position. Persistent dense bibasilar atelectasis and/or infiltrates. No interim change. Cardiomegaly with pulmonary vascular congestion. Small left pleural effusion cannot be excluded . No pneumothorax. IMPRESSION: 1. Lines and tubes  stable position. 2. Persistent dense bibasilar atelectasis and/or infiltrates. 3. Persistent cardiomegaly and pulmonary vascular congestion. Small left pleural effusion cannot be excluded . Electronically Signed   By: Maisie Fus  Register   On: 04/14/2015 07:11   Dg Chest Port 1 View  04/13/2015  CLINICAL DATA:  Tracheostomy. EXAM: PORTABLE CHEST 1 VIEW COMPARISON:  04/11/2015 . FINDINGS: Tracheostomy, dialysis catheter, left IJ line stable position. Stable dense bibasilar atelectasis. Cardiomegaly with mild pulmonary vascular congestion. Small left  pleural effusion cannot be excluded. IMPRESSION: 1. Lines and tubes in stable position. 2. Dense bibasilar atelectasis, unchanged from prior exam. 3. Cardiomegaly with mild pulmonary vascular congestion. Small left pleural effusion cannot be excluded . Electronically Signed   By: Maisie Fus  Register   On: 04/13/2015 07:12   Dg Chest Port 1 View  04/11/2015  CLINICAL DATA:  Post intubation. EXAM: PORTABLE CHEST 1 VIEW COMPARISON:  04/10/2015 FINDINGS: Endotracheal tube in stable position. Left IJ central venous catheter and right IJ dialysis catheter are unchanged. Enteric catheter folds on itself, tip at the expected location of gastric cardia. Cardiomediastinal silhouette is enlarged. Mediastinal contours appear intact. There is no evidence of pleural effusion or pneumothorax. Streaky opacities in bilateral lower lobes may represent atelectasis. Osseous structures are without acute abnormality. Soft tissues are grossly normal. IMPRESSION: Stable support apparatus. Persistent bibasilar atelectasis. Electronically Signed   By: Ted Mcalpine M.D.   On: 04/11/2015 09:26   Dg Chest Port 1 View  04/10/2015  CLINICAL DATA:  Respiratory failure. History of congestive heart failure and hypertension. EXAM: PORTABLE CHEST 1 VIEW COMPARISON:  04/08/2015. FINDINGS: 0612 hours. The endotracheal tube, left IJ central venous catheter, right IJ dialysis catheter and nasogastric  tube appear unchanged. There are persistent low lung volumes with bibasilar atelectasis. No edema, pneumothorax or significant pleural effusion identified. The heart size and mediastinal contours are stable. IMPRESSION: Stable support system and lines. Persistent bibasilar atelectasis without definite edema. Electronically Signed   By: Carey Bullocks M.D.   On: 04/10/2015 08:16   Dg Chest Port 1 View  04/08/2015  CLINICAL DATA:  Intubated, CHF EXAM: PORTABLE CHEST 1 VIEW COMPARISON:  Chest radiograph from earlier today. FINDINGS: Endotracheal tube tip is 3.2 cm above the carina. Enteric tube enters the stomach, with the tip not seen on this image. Right internal jugular central venous catheter terminates in the lower third of the superior vena cava. Left internal jugular central venous catheter terminates in the upper third of the superior vena cava. Stable cardiomediastinal silhouette with mild cardiomegaly. No pneumothorax. No pleural effusion. Low lung volumes. Worsening mild pulmonary edema. Patchy consolidation at both lung bases, left greater than right, not appreciably changed. IMPRESSION: 1. Well-positioned lines and tubes. 2. Worsening mild congestive heart failure. 3. Low lung volumes. Patchy bibasilar lung consolidation, left greater than right, favor atelectasis, cannot exclude a component of pneumonia. Electronically Signed   By: Delbert Phenix M.D.   On: 04/08/2015 15:55   Dg Chest Port 1 View  04/08/2015  CLINICAL DATA:  Respiratory failure. EXAM: PORTABLE CHEST 1 VIEW COMPARISON:  04/07/2015 . FINDINGS: Endotracheal tube, NG tube, dual-lumen right IJ catheter, left IJ line stable position. Cardiomegaly. Progressive bibasilar pulmonary atelectasis and/or infiltrates again noted. No prominent pleural effusion. No pneumothorax. IMPRESSION: 1. Lines and tubes in stable position. 2. Progressive bibasilar atelectasis and/or infiltrates. 3. Stable cardiomegaly. Electronically Signed   By: Maisie Fus   Register   On: 04/08/2015 07:27   Dg Chest Port 1 View  04/07/2015  CLINICAL DATA:  Hypoxia EXAM: PORTABLE CHEST 1 VIEW COMPARISON:  Study obtained earlier in the day FINDINGS: Endotracheal tube tip is 6.7 cm above the carina. The left jugular catheter tip is in the left innominate vein. The right subclavian catheter tip is in the superior vena cava near the cavoatrial junction. No pneumothorax. Nasogastric tube tip and side port are below the diaphragm. There is atelectasis in the lung bases, more on the left than on the right, stable. Heart is prominent  with pulmonary vascularity normal, stable. No adenopathy. IMPRESSION: Left jugular catheter tip is currently in the left innominate vein. Other tubes and catheters as described. No pneumothorax. Persistent atelectasis in lung bases, more on the left on the right, stable. No change in cardiac silhouette. Electronically Signed   By: Bretta Bang III M.D.   On: 04/07/2015 19:11   Dg Chest Port 1 View  04/07/2015  CLINICAL DATA:  Endotracheal tube EXAM: PORTABLE CHEST 1 VIEW COMPARISON:  Yesterday FINDINGS: Low volumes with bibasilar atelectasis based on chest CT 04/02/2015. Opacity has increased, especially on the right where there is flat morphology. Marked cardiopericardial enlargement without pericardial effusion on comparison CT. Endotracheal tube in good position with tip at the clavicular heads. The orogastric tube continues to the stomach at least. Bilateral IJ catheters, tips in stable unremarkable position. No effusion or pneumothorax. Pulmonary venous congestion. IMPRESSION: 1. Stable positioning of tubes and lines. 2. Worsening bibasilar opacity, atelectasis based on recent chest CT. 3. Low volumes and pulmonary venous congestion. Electronically Signed   By: Marnee Spring M.D.   On: 04/07/2015 07:46   Dg Chest Port 1 View  04/06/2015  CLINICAL DATA:  55 year old male with respiratory failure EXAM: PORTABLE CHEST 1 VIEW COMPARISON:   Radiograph dated 04/05/2015 FINDINGS: Endotracheal tube above the carina enteric tube coursing towards upper abdomen. Right-sided dialysis catheter and left IJ central line in stable positioning. There is stable cardiomegaly. Low lung volumes. Focal density at the right lung base similar to prior study and may represent atelectasis versus pneumonia. Left lung base opacity appears stable and may represent atelectasis/pneumonia. A small left pleural effusion may be present. No pneumothorax. IMPRESSION: No significant interval change. Electronically Signed   By: Elgie Collard M.D.   On: 04/06/2015 04:34   Dg Chest Port 1 View  04/05/2015  CLINICAL DATA:  Acute respiratory failure EXAM: PORTABLE CHEST 1 VIEW COMPARISON:  04/04/2015 FINDINGS: Endotracheal tube, LEFT central venous line and RIGHT central venous line unchanged. Stable enlarged cardiac silhouette. Bibasilar atelectasis again demonstrated. Slight increase in RIGHT lower lobe atelectasis. No overt pulmonary edema. No pneumothorax. IMPRESSION: 1. Stable support apparatus. 2. Slight increase in RIGHT basilar atelectasis. 3. Dense LEFT basilar atelectasis unchanged. Electronically Signed   By: Genevive Bi M.D.   On: 04/05/2015 09:39   Dg Chest Port 1 View  04/04/2015  CLINICAL DATA:  Followup exam. End-stage renal disease with respiratory failure. Intubated patient. EXAM: PORTABLE CHEST 1 VIEW COMPARISON:  04/03/2015 FINDINGS: Lung volumes remain low. Elevation of the right hemidiaphragm is stable. Mild basilar atelectasis. The hazy opacity noted in the lungs on the prior study has resolved consistent with resolved edema. Cardiac silhouette mildly enlarged.  No mediastinal or hilar masses. Endotracheal tube, nasogastric tube, left internal jugular central venous line and right internal jugular tunneled dual lumen central venous catheter are all stable from prior study. IMPRESSION: 1. Improved lung aeration. Pulmonary edema has essentially resolved.  There is mild residual basilar atelectasis. 2. No new abnormalities. 3. Support apparatus is stable and well positioned. Electronically Signed   By: Amie Portland M.D.   On: 04/04/2015 10:41   Dg Chest Port 1 View  04/03/2015  CLINICAL DATA:  Obstructive sleep apnea, respiratory failure, acute encephalopathy, end-stage renal disease. EXAM: PORTABLE CHEST 1 VIEW COMPARISON:  Portable chest x-ray and chest CT scan of 2017. FINDINGS: The lungs remain hypoinflated. Confluent alveolar opacities persist bilaterally. The cardiac silhouette remains enlarged. The pulmonary vascularity remains engorged and indistinct. The endotracheal tube  tip lies 3.9 cm above the carina. The esophagogastric tube tip projects below the inferior margin of the image. The dialysis catheter on the right has its tip projecting over the distal third of the SVC. The left internal jugular venous catheter tip projects over the middle third of the SVC. IMPRESSION: Congestive heart failure with pulmonary interstitial and alveolar edema. Mild stable atelectasis at both lung bases. Electronically Signed   By: David  [place] M.D.   On: 04/03/2015 07:57   Dg Chest Port 1 View  04/02/2015  CLINICAL DATA:  Assess endotracheal tube and central line placement. EXAM: PORTABLE CHEST 1 VIEW COMPARISON:  Radiographs today at 1016 hour FINDINGS: The endotracheal tube appears low, carina difficult to visualize, however likely less than 1 cm from the carina. Recommend retraction of 1-2 cm. Tip of the left central line in the region of the proximal SVC. Right-sided dialysis catheter remains in place. Cardiomegaly is again seen. Vascular congestion probable perihilar edema, with air bronchograms on the left. Elevation of right hemidiaphragm with adjacent right basilar atelectasis again seen. There is no pneumothorax. IMPRESSION: 1. Endotracheal tube appears low, retraction of 1-2 cm recommended. 2. Tip of the left central line in the region of the proximal SVC. No  pneumothorax. 3. Cardiomegaly with vascular congestion and perihilar edema, similar allowing for differences in technique. These results were called by telephone at the time of interpretation on 04/02/2015 at 2:10 am to nurse Rocco Pauls, who verbally acknowledged these results. Electronically Signed   By: Rubye Oaks M.D.   On: 04/02/2015 02:11   Dg Chest Port 1 View  03/31/2015  CLINICAL DATA:  Acute respiratory failure EXAM: PORTABLE CHEST 1 VIEW COMPARISON:  One day prior FINDINGS: Right-sided dialysis catheter is unchanged. A left internal jugular line terminates at the low SVC, also similar. Extubation. Removal of nasogastric tube. Mildly degraded exam due to AP portable technique and patient body habitus. Midline trachea. Cardiomegaly accentuated by AP portable technique. moderate right hemidiaphragm elevation. Cannot exclude small bilateral pleural effusions. No pneumothorax. low low lung volumes with resultant prominence of the pulmonary interstitium. Right base atelectasis. Left base poorly evaluated. IMPRESSION: Multifactorial degradation, including further diminished lung volumes. Extubation with worsened bibasilar aeration, favoring atelectasis. cardiomegaly with pulmonary interstitial prominence, primarily felt to be due to low lung volumes. Mild interstitial edema cannot be excluded. Electronically Signed   By: Jeronimo Greaves M.D.   On: 03/31/2015 07:40   Dg Chest Port 1 View  03/30/2015  CLINICAL DATA:  Shortness of breath after dialysis. EXAM: PORTABLE CHEST 1 VIEW COMPARISON:  03/29/2015 FINDINGS: The heart is enlarged but stable. Persistent central vascular congestion and streaky areas of atelectasis but no overt pulmonary edema. No definite pleural effusions. The support apparatus is stable. IMPRESSION: Stable support apparatus. Stable cardiac enlargement, vascular congestion and streaky areas of atelectasis but no overt pulmonary edema or definite pleural effusions. Electronically  Signed   By: Rudie Meyer M.D.   On: 03/30/2015 08:05   Dg Chest Portable 1 View  03/29/2015  CLINICAL DATA:  Shortness of breath, intubation EXAM: PORTABLE CHEST 1 VIEW COMPARISON:  03/29/2015 FINDINGS: Cardiac enlargement unchanged. Limited inspiratory effect. Bilateral lower lung zone opacity similar to prior study. Endotracheal tube tip 19 mm above the carina. Orogastric tube extends off the inferior edge of the film. Right central line stable. IMPRESSION: Lines and tubes as described above. Electronically Signed   By: Esperanza Heir M.D.   On: 03/29/2015 14:46   Dg Chest Port 1  View  03/29/2015  CLINICAL DATA:  Decreased oxygen saturation today. EXAM: PORTABLE CHEST 1 VIEW COMPARISON:  Single view of the chest 03/26/2015 and 03/25/2015. FINDINGS: Left IJ catheter has been removed since the most recent examination. Right IJ approach dialysis catheter remains in place. There is cardiomegaly without edema. Subsegmental atelectasis is seen in the right lung base. No pneumothorax or pleural effusion. IMPRESSION: No change in subsegmental atelectasis right lung base. Cardiomegaly without edema. Electronically Signed   By: Drusilla Kanner M.D.   On: 03/29/2015 13:19   Dg Chest Port 1 View  03/26/2015  CLINICAL DATA:  Respiratory failure. EXAM: PORTABLE CHEST 1 VIEW COMPARISON:  03/25/2015.  01/22/2015. FINDINGS: Interim extubation and removal of NG tube. Dialysis catheter left IJ line stable position. Cardiomegaly with pulmonary vascular prominence and mild interstitial prominence suggesting mild congestive heart failure. Small left pleural effusion cannot be excluded. Low lung volumes with basilar atelectasis. No pneumothorax. Nodular density is noted over the right supra clavicular soft tissues. I IMPRESSION: 1. Interim extubation and removal of NG tube. Dialysis catheter and left IJ line in stable position. 2. Cardiomegaly with pulmonary vascular prominence and bilateral interstitial prominence suggesting  congestive heart failure. Small left pleural effusion cannot be excluded. Low lung volumes with basilar atelectasis . 3. Soft tissue nodular density is noted over the right supraclavicular region. Clinical correlation suggested. Electronically Signed   By: Maisie Fus  Register   On: 03/26/2015 07:42   Dg Chest Port 1 View  03/25/2015  CLINICAL DATA:  Acute respiratory failure. EXAM: PORTABLE CHEST 1 VIEW COMPARISON:  03/24/2015. FINDINGS: ET tube 4.5 cm above carina. Unchanged central venous line, double lumen dialysis catheter, and enteric tube. Cardiomegaly. Decreased lung volumes with moderate edema but has worsened from yesterday's examination. No pneumothorax. IMPRESSION: Worsening aeration. Decreased lung volumes with moderate edema. Tubes and lines are stable. Electronically Signed   By: Elsie Stain M.D.   On: 03/25/2015 07:34   Dg Chest Port 1 View  03/24/2015  CLINICAL DATA:  Central line placement EXAM: PORTABLE CHEST 1 VIEW COMPARISON:  03/24/2015 FINDINGS: A left jugular central venous catheter has been placed. The tip is in the upper SVC. No ensuing pneumothorax. Endotracheal tube, NG tube, and right jugular dialysis catheter are stable. Cardiomegaly is unchanged. Low lung volumes have improved. No consolidation. IMPRESSION: New left jugular central venous catheter with its tip in the upper SVC and no pneumothorax. Improved lung volumes. Electronically Signed   By: Jolaine Click M.D.   On: 03/24/2015 19:53   Dg Chest Portable 1 View  03/24/2015  CLINICAL DATA:  Patient unresponsive.  Severe respiratory distress. EXAM: PORTABLE CHEST 1 VIEW COMPARISON:  Portable film earlier in the day. FINDINGS: ET tube has been inserted, and lies 3.8 cm above carina. Dual lumen catheter from RIGHT IJ approach unchanged, with tips in the RIGHT atrium. Cardiomegaly. Worsening pulmonary edema. Low lung volumes. Enteric tube placed in the stomach. IMPRESSION: ET tube good position, 3.8 cm above carina. ET tube good  position 3.8 cm above carina. Worsening aeration, now with pulmonary edema. Electronically Signed   By: Elsie Stain M.D.   On: 03/24/2015 15:08   Dg Chest Portable 1 View  03/24/2015  CLINICAL DATA:  Shortness of Breath.  Chronic renal failure. EXAM: PORTABLE CHEST 1 VIEW COMPARISON:  February 23, 2015 FINDINGS: There is persistent elevation of the right hemidiaphragm. There is no edema or consolidation. There is mild scarring in the left base. There is mild cardiomegaly with pulmonary vascularity  within normal limits. Dual-lumen catheter with tips in superior vena cava. No pneumothorax. IMPRESSION: Central catheter as described without pneumothorax. No edema or consolidation. Slight scarring left base. Stable elevation right hemidiaphragm. Stable cardiac prominence. Electronically Signed   By: Bretta Bang III M.D.   On: 03/24/2015 13:20   Dg Chest Port 1v Same Day  03/29/2015  CLINICAL DATA:  Central line placement. EXAM: PORTABLE CHEST 1 VIEW COMPARISON:  03/29/2015 FINDINGS: There has been interval placement of left internal jugular approach central venous catheter with tip terminating at the expected location of superior vena cava. Large dual lumen right internal jugular central venous catheter, and endotracheal tube are stable. Enteric catheter is partially visualized. The cardiac silhouette is enlarged. Mediastinal contours appear intact. There is no evidence of pneumothorax. Lung volumes are low with prominent interstitial markings. Osseous structures are without acute abnormality. Soft tissues are grossly normal. IMPRESSION: Status post placement of left internal jugular approach central venous catheter. No evidence of pneumothorax. Otherwise stable supporting apparatus. Low lung volumes with pulmonary vascular congestion. Enlarged cardiac silhouette. Electronically Signed   By: Ted Mcalpine M.D.   On: 03/29/2015 15:31   Dg Abd Portable 1v  04/10/2015  CLINICAL DATA:  NG tube  replacement. EXAM: PORTABLE ABDOMEN - 1 VIEW COMPARISON:  One view the abdomen from the same day. FINDINGS: The side port of an NG tube is in the fundus the stomach. A central venous catheter terminates at the cavoatrial junction. Bilateral lower lobe airspace disease is evident. The bowel gas pattern is normal. IMPRESSION: 1. The side port of the NG tube is in the fundus of the stomach. 2. Bilateral lower lobe pneumonia. Electronically Signed   By: Marin Roberts M.D.   On: 04/10/2015 13:32   Dg Abd Portable 1v  04/10/2015  CLINICAL DATA:  NG tube placement. EXAM: PORTABLE ABDOMEN - 1 VIEW COMPARISON:  03/29/2015 FINDINGS: NG tube is not visualized in the abdomen or visualized mid or lower chest. Gas, stool and contrast material throughout the colon. No evidence of bowel obstruction. IMPRESSION: Nonvisualization of the NG tube. Electronically Signed   By: Charlett Nose M.D.   On: 04/10/2015 13:28   Dg Abd Portable 1v  03/29/2015  CLINICAL DATA:  55 year old male status post OG tube placement EXAM: PORTABLE ABDOMEN - 1 VIEW COMPARISON:  Concurrently obtained chest x-ray FINDINGS: The tip of the orogastric tube projects over the region of the gastric fundus. The bowel gas pattern is not obstructed. No acute osseous abnormality. IMPRESSION: The tip of the orogastric tube projects over the gastric fundus in acceptable position. Electronically Signed   By: Malachy Moan M.D.   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 He missed dialysis and was intubated in ED when he failed NIMVS. K+ is 5.8. Renal is aware. We will give kayexalate to reduce K+, admit to ICU again. Empiric abx will be continued for now.  Hospital Course by problem list:   Acute on chronic hypoxemic/hypercapnic respiratory failure in setting of  pulmonary edema  s/p tracheostomy - Pt presented with dyspnea after failure to attend HD with history of noncompliance. Pt required intubation with ventilatory support after failing bipap and emergent HD. He was extubated on 1/9 but required re-intubation on 1/11 which required bronchoscopy due to difficult  airway. He needed ETT changed on 1/18 due to cuff leak. He had tracheostomy placement on 1/20 in OR by Dr. Pollyann Kennedy. He been unable to wean off vent due to refractory agitation. He is to have g-tube placement by IR once his agitation is improved.   Refractory agitation and psychosis - Pt required heavy sedation while intubated on ventilatory support including versed, fentanyl, propofol, and dilaudid drips in addition to scheduled haldol, ativan, and as needed versed. At time of discharge pt is currently on precedex and fentanyl infusions, ativan 2 mg Q 6 hr, versed 1 mg Q 2 hr PRN, IV  valproate 500 mg TID, seroqel 200 mg daily, and fentanyl patch 75 mcg Q 72 hr. Despite multiple medications, he been very agitated, combative, and aggressive at times punching/kicking staff required soft restraints. 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 Pt needs monitoring of QTc.    Hypotension - Pt required pressor support with mixture of levophed, vasopressin, and neosynephrine as needed. Pt on no pressor support since 1/25. Pt was started on midodrine by nephrology. Pt is on stress steroids which which will end on 1/29.  Left antecubital wound infection - Pt received vancomycin and zosyn for 5 days with negative blood cultures. Pt was seen on 12/15 by [facility] and was intermittently on vancomycin with wet to dry dressings.  ESRD with history of noncompliance with HD - Pt presented with volume overload in setting of failure to attend HD requiring emergent HD. He received CRRT while hospitalized per renal.   Acute on  chronic anemia of renal disease - Pt was started on ESA in setting of Hg <10 with normal iron reserves (Tstat 20%). Pt with no active bleeding during hospitalization.     Discharge Vitals:   BP 111/67 mmHg  Pulse 82  Temp(Src) 99 F (37.2 C) (Axillary)  Resp 30  Ht 6\' 2"  (1.88 m)  Wt 315 lb 7.7 oz (143.1 kg)  BMI 40.49 kg/m2  SpO2 100%  Discharge Labs:  Results for orders placed or performed during the hospital encounter of 03/29/15 (from the past 24 hour(s))  Iron and TIBC     Status: Abnormal   Collection Time: 04/14/15 10:50 AM  Result Value Ref Range   Iron 43 (L) 45 - 182 ug/dL   TIBC 161 (L) 096 - 045 ug/dL   Saturation Ratios 20 17.9 - 39.5 %   UIBC 173 ug/dL  Ferritin     Status: Abnormal   Collection Time: 04/14/15 10:50 AM  Result Value Ref Range   Ferritin 858 (H) 24 - 336 ng/mL  Glucose, capillary     Status: Abnormal   Collection Time: 04/14/15 12:18 PM  Result Value Ref Range   Glucose-Capillary 126 (H) 65 - 99 mg/dL   Comment 1 Arterial Specimen   Glucose, capillary     Status: Abnormal   Collection Time: 04/14/15  4:47 PM  Result Value Ref Range   Glucose-Capillary 114 (H) 65 - 99 mg/dL   Comment 1 Capillary Specimen   Glucose, capillary     Status: Abnormal   Collection Time: 04/14/15  7:48 PM  Result Value Ref Range   Glucose-Capillary 129 (H) 65 - 99 mg/dL   Comment 1 Capillary Specimen   Glucose, capillary     Status: Abnormal   Collection Time: 04/14/15 11:59 PM  Result Value Ref Range   Glucose-Capillary 120 (H)  65 - 99 mg/dL   Comment 1 Capillary Specimen   Renal function panel     Status: Abnormal   Collection Time: 04/15/15  3:13 AM  Result Value Ref Range   Sodium 135 135 - 145 mmol/L   Potassium 3.3 (L) 3.5 - 5.1 mmol/L   Chloride 100 (L) 101 - 111 mmol/L   CO2 20 (L) 22 - 32 mmol/L   Glucose, Bld 147 (H) 65 - 99 mg/dL   BUN 75 (H) 6 - 20 mg/dL   Creatinine, Ser 1.61 (H) 0.61 - 1.24 mg/dL   Calcium 8.3 (L) 8.9 - 10.3 mg/dL    Phosphorus 5.2 (H) 2.5 - 4.6 mg/dL   Albumin 2.3 (L) 3.5 - 5.0 g/dL   GFR calc non Af Amer 9 (L) >60 mL/min   GFR calc Af Amer 11 (L) >60 mL/min   Anion gap 15 5 - 15  Glucose, capillary     Status: Abnormal   Collection Time: 04/15/15  3:13 AM  Result Value Ref Range   Glucose-Capillary 121 (H) 65 - 99 mg/dL   Comment 1 Capillary Specimen   CBC     Status: Abnormal   Collection Time: 04/15/15  5:50 AM  Result Value Ref Range   WBC 5.9 4.0 - 10.5 K/uL   RBC 2.63 (L) 4.22 - 5.81 MIL/uL   Hemoglobin 7.7 (L) 13.0 - 17.0 g/dL   HCT 09.6 (L) 04.5 - 40.9 %   MCV 88.6 78.0 - 100.0 fL   MCH 29.3 26.0 - 34.0 pg   MCHC 33.0 30.0 - 36.0 g/dL   RDW 81.1 (H) 91.4 - 78.2 %   Platelets 81 (L) 150 - 400 K/uL  Hepatic function panel     Status: Abnormal   Collection Time: 04/15/15  5:50 AM  Result Value Ref Range   Total Protein 5.8 (L) 6.5 - 8.1 g/dL   Albumin 2.3 (L) 3.5 - 5.0 g/dL   AST 33 15 - 41 U/L   ALT 17 17 - 63 U/L   Alkaline Phosphatase 61 38 - 126 U/L   Total Bilirubin 0.9 0.3 - 1.2 mg/dL   Bilirubin, Direct 0.3 0.1 - 0.5 mg/dL   Indirect Bilirubin 0.6 0.3 - 0.9 mg/dL  Protime-INR     Status: Abnormal   Collection Time: 04/15/15  5:50 AM  Result Value Ref Range   Prothrombin Time 22.8 (H) 11.6 - 15.2 seconds   INR 2.03 (H) 0.00 - 1.49  Glucose, capillary     Status: Abnormal   Collection Time: 04/15/15  8:13 AM  Result Value Ref Range   Glucose-Capillary 134 (H) 65 - 99 mg/dL   Comment 1 Venous Specimen     Signed: Otis Brace, MD  PGY-3 IMTS 04/15/2015, 10:20 AM    Services Ordered on Discharge: None Equipment Ordered on Discharge: None STAFF NOTE: I, Rory Percy, MD FACP have personally reviewed patient's available data, including medical history, events of note, physical examination and test results as part of my evaluation. I have discussed with resident/NP and other care providers such as pharmacist, RN and RRT. In addition, I personally evaluated patient  and elicited key findings of: calmer this am , less coarse BS, back on precedex and fentanyl, delirium seems to continued to be a huge issue on progress, methadone dc as fent back up, may need dilauded re attempt to get off fent, need to max depakote further, wean attempt PS 20, it is likely he will wean off vent over  time   Mcarthur Rossetti. Tyson Alias, MD, FACP Pgr: [phone] [facility] 04/15/2015 10:18 PM

## 2015-04-15 NOTE — Consult Note (Signed)
Date: 04/15/2015                  Patient Name:  Gary Rivera  MRN: [id number]  DOB: Jan 03, 1961  Age / Sex: 55 y.o., male         PCP: [name], MD                 Service Requesting Consult: [facility] IM                 Reason for Consult: ESRD            History of Present Illness: Patient is a 55 y.o. male with medical problems of end-stage renal disease dialyzing at [facility], congestive heart failure, anemia, hypertension, who was admitted to [facility] on 04/15/2015  He was originally admitted to [facility] on January 8 for respiratory failure. He missed dialysis and was intubated in the emergency room. He was admitted to the ICU. Hospital course was complicated by requirement for tracheostomy  [facility] He is currently sedated. He is not able to provide any meaningful information He is also somewhat agitated today Dialysis access: Right IJ PermCath Brent settings: FiO2 50%/PEEP 5    Medications: Outpatient medications: Prescriptions prior to admission  Medication Sig Dispense Refill Last Dose  . albuterol (PROVENTIL) (2.5 MG/3ML) 0.083% nebulizer solution Take 3 mLs (2.5 mg total) by nebulization every 3 (three) hours as needed for wheezing. 75 mL 12   . Amino Acids-Protein Hydrolys (FEEDING SUPPLEMENT, PRO-STAT SUGAR FREE 64,) LIQD Place 60 mLs into feeding tube 5 (five) times daily. 900 mL 0   . chlorhexidine gluconate (PERIDEX) 0.12 % solution 15 mLs by Mouth Rinse route 2 (two) times daily. 120 mL 0   . [START ON 04/22/2015] Darbepoetin Alfa (ARANESP) 100 MCG/0.5ML SOSY injection Inject 0.5 mLs (100 mcg total) into the vein every Wednesday with hemodialysis. 4.2 mL 0   . dexmedetomidine (PRECEDEX) 400 MCG/100ML SOLN Inject 53.6-160.8 mcg/hr into the vein continuous.     Marland Kitchen doxercalciferol (HECTOROL) 4 MCG/2ML injection Inject 1.5 mLs (3 mcg total) into the vein Every Tuesday,Thursday,and Saturday with  dialysis. 2 mL    . [START ON 04/17/2015] fentaNYL (DURAGESIC - DOSED MCG/HR) 75 MCG/HR Place 1 patch (75 mcg total) onto the skin every 3 (three) days. 5 patch 0   . fentaNYL 2,500 mcg in sodium chloride 0.9 % 200 mL Inject 10 mcg/hr into the vein continuous.     . hydrocortisone sodium succinate (SOLU-CORTEF) 100 MG SOLR injection Inject 1 mL (50 mg total) into the vein every 12 (twelve) hours. 1 each    . [START ON 04/18/2015] hydrocortisone sodium succinate (SOLU-CORTEF) 100 MG SOLR injection Inject 1 mL (50 mg total) into the vein daily. 1 each    . LORazepam (ATIVAN) 2 MG/ML injection Inject 1 mL (2 mg total) into the vein every 6 (six) hours. 1 mL 0   . midazolam (VERSED) 2 MG/2ML SOLN injection Inject 1 mL (1 mg total) into the vein every 2 (two) hours as needed for agitation. 2.5 mL 0   . midodrine (PROAMATINE) 10 MG tablet Take 1 tablet (10 mg total) by mouth 3 (three) times daily before meals.     . multivitamin (RENA-VIT) TABS tablet Take 1 tablet by mouth at bedtime. 30 each 0 03/28/2015 at Unknown time  . Nutritional Supplements (FEEDING SUPPLEMENT, NEPRO CARB STEADY,) LIQD Place 1,000 mLs into feeding tube daily.  0   . pantoprazole sodium (PROTONIX) 40 mg/20 mL PACK Place 20 mLs (40 mg total) into feeding tube at bedtime.     Marland Kitchen QUEtiapine (SEROQUEL) 200 MG tablet Place 1 tablet (200 mg total) into feeding tube daily.     . sennosides (SENOKOT) 8.8 MG/5ML syrup Take 5 mLs by mouth 2 (two) times daily. 240 mL 0   . sodium chloride 0.9 % infusion Inject 100 mLs into the vein as needed (symptomatic hypotension or decrease in SBP < 90 mmHg).  0   . sodium chloride 0.9 % infusion Inject 100 mLs into the vein as needed (severe cramping). 100 mL 0   . sodium chloride 0.9 % infusion Inject 10 mLs into the vein continuous.  0   . sodium chloride 0.9 % injection 10-40 mLs by Intracatheter route as needed (flush).     . valproate 500 mg in dextrose 5 % 50 mL Inject 500 mg into the vein 3 (three)  times daily.       Current medications: No current facility-administered medications for this encounter.      Allergies: Allergies  Allergen Reactions  . Renvela [Sevelamer] Nausea And Vomiting      Past Medical History: Past Medical History  Diagnosis Date  . ESRD on hemodialysis (HCC)     Started dialysis with PD in 2013 and switched to HD in spring 2014 because of fungal peritonitis.  Takes dialysis at home 5 days a week approx 3h 15 min each session.     . CHF (congestive heart failure) (HCC)   . Peritoneal dialysis catheter in place [facility])   . Anemia     patient reporats that last hgb 7.9 a week ago  . Anxiety state, unspecified   . Hypertension      Past Surgical History: Past Surgical History  Procedure Laterality Date  . Appendectomy    . Peritoneal catheter insertion    . Tee without cardioversion N/A 07/11/2013    Procedure: TRANSESOPHAGEAL ECHOCARDIOGRAM (TEE);  Surgeon: Pricilla Riffle, MD;  Location: [facility] ENDOSCOPY;  Service: Cardiovascular;  Laterality: N/A;  . Cardioversion N/A 07/11/2013    Procedure: CARDIOVERSION;  Surgeon: Pricilla Riffle, MD;  Location: [facility] ENDOSCOPY;  Service: Cardiovascular;  Laterality: N/A;  . Knee arthroscopy Right 05/18/2014    Procedure: ARTHROSCOPIC WASHOUT OF RIGHT KNEE;  Surgeon: Sheral Apley, MD;  Location[facility] OR;  Service: Orthopedics;  Laterality: Right;  . Av fistula placement Left 02/18/2015    Procedure: Left Brachial Cephalic ARTERIOVENOUS FISTULA CREATION;  Surgeon: Sherren Kerns, MD;  Location: [facility] OR;  Service: Vascular;  Laterality: Left;  . Insertion of dialysis catheter Right 02/18/2015    Procedure: INSERTION OF DIALYSIS CATHETER Right Internal Jugular;  Surgeon: Sherren Kerns, MD;  Location: [facility] OR;  Service: Vascular;  Laterality: Right;  . Central venous catheter insertion    . Tracheostomy tube placement N/A 04/10/2015    Procedure: TRACHEOSTOMY;  Surgeon: Serena Colonel, MD;  Location: [facility] OR;  Service: ENT;   Laterality: N/A;     Family History: Family History  Problem Relation Age of Onset  . Diabetes Mother   . Hypertension Mother   . Hypertension Sister   . Asthma Sister      Social History: Social History   Social History  . Marital Status: Single    Spouse Name: N/A  . Number of Children: 0  . Years of Education: N/A   Occupational History  . disabled  Social History Main Topics  . Smoking status: Never Smoker   . Smokeless tobacco: Never Used  . Alcohol Use: No  . Drug Use: No  . Sexual Activity: Not on file   Other Topics Concern  . Not on file   Social History Narrative     Review of Systems:not available due to patient's condition Gen:  HEENT:  CV:  Resp:  GI: GU :  MS:  Derm:   Psych: Heme:  Neuro:  Endocrine  Vital Signs: Blood pressure 134/75, heart rate 76, saturation 95%   Physical Exam: General:  agitated, laying in the bed  HEENT Eyes open at times, NG tube  Neck:  trach in place  Lungs: Coarse breath sounds bilaterally  Heart::  regular rhythm, no rub or gallop  Abdomen: Soft, somewhat distended, nontender  Extremities:  trace peripheral edema  Neurologic: sedated  Skin: No acute rashes  Access: Right IJ PermCath  Foley:        Lab results: Basic Metabolic Panel:  Recent Labs Lab 04/12/15 0424  04/13/15 0500 04/14/15 0515 04/15/15 0313  NA  --   < > 137 136 135  K  --   < > 3.4* 3.8 3.3*  CL  --   < > 105 104 100*  CO2  --   < > 16* 14* 20*  GLUCOSE  --   < > 129* 154* 147*  BUN  --   < > 89* 129* 75*  CREATININE  --   < > 6.77* 9.10* 6.10*  CALCIUM  --   < > 8.8* 8.4* 8.3*  MG 1.8  --  2.0 2.2  --   PHOS 3.5  < > 6.2* 8.2* 5.2*  < > = values in this interval not displayed.  Liver Function Tests:  Recent Labs Lab 04/15/15 0550  AST 33  ALT 17  ALKPHOS 61  BILITOT 0.9  PROT 5.8*  ALBUMIN 2.3*   No results for input(s): LIPASE, AMYLASE in the last 168 hours. No results for input(s): AMMONIA in the  last 168 hours.  CBC:  Recent Labs Lab 04/10/15 0436  04/14/15 0515 04/15/15 0550  WBC 11.0*  < > 7.0 5.9  NEUTROABS 8.1*  --   --   --   HGB 9.1*  < > 7.9* 7.7*  HCT 29.9*  < > 24.9* 23.3*  MCV 95.8  < > 90.2 88.6  PLT 69*  < > 87* 81*  < > = values in this interval not displayed.  Cardiac Enzymes: No results for input(s): CKTOTAL, TROPONINI in the last 168 hours.  BNP: Invalid input(s): POCBNP  CBG:  Recent Labs Lab 04/14/15 1948 04/14/15 2359 04/15/15 0313 04/15/15 0813 04/15/15 1251  GLUCAP 129* 120* 121* 134* 146*    Microbiology: Recent Results (from the past 720 hour(s))  Blood culture (routine x 2)     Status: None   Collection Time: 03/24/15 12:52 PM  Result Value Ref Range Status   Specimen Description BLOOD LEFT ANTECUBITAL  Final   Special Requests IN PEDIATRIC BOTTLE 1CCS  Final   Culture NO GROWTH 5 DAYS  Final   Report Status 03/30/2015 FINAL  Final  Blood culture (routine x 2)     Status: None   Collection Time: 03/24/15  1:21 PM  Result Value Ref Range Status   Specimen Description BLOOD RIGHT HAND  Final   Special Requests BOTTLES DRAWN AEROBIC AND ANAEROBIC 5CC  Final   Culture NO GROWTH 5 DAYS  Final   Report Status 03/29/2015 FINAL  Final  MRSA PCR Screening     Status: None   Collection Time: 03/24/15  5:20 PM  Result Value Ref Range Status   MRSA by PCR NEGATIVE NEGATIVE Final    Comment:        The GeneXpert MRSA Assay (FDA approved for NASAL specimens only), is one component of a comprehensive MRSA colonization surveillance program. It is not intended to diagnose MRSA infection nor to guide or monitor treatment for MRSA infections.   Culture, blood (routine x 2)     Status: None   Collection Time: 03/29/15 12:05 PM  Result Value Ref Range Status   Specimen Description BLOOD RIGHT ANTECUBITAL  Final   Special Requests BOTTLES DRAWN AEROBIC AND ANAEROBIC  Final   Culture NO GROWTH 5 DAYS  Final   Report Status  04/03/2015 FINAL  Final  Culture, blood (routine x 2)     Status: None   Collection Time: 03/29/15 12:42 PM  Result Value Ref Range Status   Specimen Description BLOOD RIGHT ANTECUBITAL  Final   Special Requests BOTTLES DRAWN AEROBIC AND ANAEROBIC 3CCS  Final   Culture NO GROWTH 5 DAYS  Final   Report Status 04/03/2015 FINAL  Final  MRSA PCR Screening     Status: None   Collection Time: 03/29/15  5:48 PM  Result Value Ref Range Status   MRSA by PCR NEGATIVE NEGATIVE Final    Comment:        The GeneXpert MRSA Assay (FDA approved for NASAL specimens only), is one component of a comprehensive MRSA colonization surveillance program. It is not intended to diagnose MRSA infection nor to guide or monitor treatment for MRSA infections.   Respiratory virus panel     Status: None   Collection Time: 04/01/15  4:22 PM  Result Value Ref Range Status   Respiratory Syncytial Virus A Negative Negative Final   Respiratory Syncytial Virus B Negative Negative Final   Influenza A Negative Negative Final   Influenza B Negative Negative Final   Parainfluenza 1 Negative Negative Final   Parainfluenza 2 Negative Negative Final   Parainfluenza 3 Negative Negative Final   Metapneumovirus Negative Negative Final   Rhinovirus Negative Negative Final   Adenovirus Negative Negative Final    Comment: (NOTE) Performed At: [facility] [address] [place], [place] [id number] Mila Homer MD WU:[id number]   Culture, respiratory (tracheal aspirate)     Status: None   Collection Time: 04/02/15 12:15 AM  Result Value Ref Range Status   Specimen Description TRACHEAL ASPIRATE  Final   Special Requests Normal  Final   Gram Stain   Final    RARE WBC PRESENT, PREDOMINANTLY MONONUCLEAR NO SQUAMOUS EPITHELIAL CELLS SEEN NO ORGANISMS SEEN Performed at [facility]    Culture   Final    NORMAL OROPHARYNGEAL FLORA Performed at [facility]    Report Status 04/05/2015 FINAL   Final     Coagulation Studies:  Recent Labs  04/13/15 0500 04/15/15 0550  LABPROT 17.4* 22.8*  INR 1.42 2.03*    Urinalysis: No results for input(s): COLORURINE, LABSPEC, PHURINE, GLUCOSEU, HGBUR, BILIRUBINUR, KETONESUR, PROTEINUR, UROBILINOGEN, NITRITE, LEUKOCYTESUR in the last 72 hours.  Invalid input(s): APPERANCEUR      Imaging: Dg Chest Port 1 View  04/14/2015  CLINICAL DATA:  Tracheotomy.  Shortness of breath. EXAM: PORTABLE CHEST 1 VIEW COMPARISON:  04/13/2015. FINDINGS: Endotracheal tube, dialysis catheter, left IJ line, NG tube  in stable position. Persistent dense bibasilar atelectasis and/or infiltrates. No interim change. Cardiomegaly with pulmonary vascular congestion. Small left pleural effusion cannot be excluded . No pneumothorax. IMPRESSION: 1. Lines and tubes stable position. 2. Persistent dense bibasilar atelectasis and/or infiltrates. 3. Persistent cardiomegaly and pulmonary vascular congestion. Small left pleural effusion cannot be excluded . Electronically Signed   By: Maisie Fus  Register   On: 04/14/2015 07:11      Assessment & Plan: Pt is a 55 y.o. yo male with a PMHX of ESRD, hypertension, congestive heart failure, was admitted on 04/15/2015  He [facility] dial[facility]zes at the [facility]  1. ESRD 2. Acute respiratory failure- Currently ventilator dependent 3. Anemia of chronic kidney disease- current hemoglobin 7.7. 4. Secondary hyperparathyroidism 5. Thrombocytopenia 6. Hypokalemia- expected to correct with tube feeds  Plan: Dialysis Monday/Wednesday/Friday Check for heparin antibodies. Pack catheter with citrate until negative results

## 2015-04-15 NOTE — Progress Notes (Signed)
[facility] Progress Note   Subjective: 2.5 kg off w HD yest.  On HD this am, goal 3.5kg. 2.2L in yest with tube feeds and sedation drips.   Filed Vitals:   04/15/15 0937 04/15/15 0945 04/15/15 1000 04/15/15 1015  BP: 162/101 111/67 150/102 93/64  Pulse: 99 82 84 73  Temp:      TempSrc:      Resp:      Height:      Weight:      SpO2: 97% 100% 100% 94%    Inpatient medications: . antiseptic oral rinse  7 mL Mouth Rinse 10 times per day  . chlorhexidine gluconate  15 mL Mouth Rinse BID  . [START ON 04/22/2015] darbepoetin (ARANESP) injection - DIALYSIS  100 mcg Intravenous Q Wed-HD  . doxercalciferol  3 mcg Intravenous Q T,Th,Sa-HD  . feeding supplement (NEPRO CARB STEADY)  1,000 mL Per Tube Q24H  . feeding supplement (PRO-STAT SUGAR FREE 64)  60 mL Per Tube 5 X Daily  . fentaNYL  50 mcg Transdermal Q72H  . hydrocortisone sod succinate (SOLU-CORTEF) inj  50 mg Intravenous Q6H  . LORazepam  2 mg Intravenous Q6H  . methadone  10 mg Per Tube 4 times per day  . midodrine  10 mg Oral TID AC  . multivitamin  1 tablet Oral QHS  . pantoprazole sodium  40 mg Per Tube QHS  . QUEtiapine  200 mg Per Tube Daily  . sennosides  5 mL Oral BID  . valproate sodium  500 mg Intravenous TID   . sodium chloride 10 mL/hr at 04/13/15 2331  . dexmedetomidine 1.2 mcg/kg/hr (04/15/15 0820)  . fentaNYL infusion INTRAVENOUS 375 mcg/hr (04/15/15 0848)  . phenylephrine (NEO-SYNEPHRINE) Adult infusion Stopped (04/15/15 0932)   sodium chloride, sodium chloride, albuterol, midazolam, sodium chloride  Exam: On vent, w trach Opens eyes  Chest clear bilat RRR no mrg Abd obese soft ntnd No LE or UE edema R IJ cath  [facility] [place]  4.5h   400/800  138kg  2K/3.5 bath  R IJ cath  Hep 4200 Hect 2 ug Mircera 75 ug q 2wks  Midodrine 10 mg pre HD      Assessment: 1 A/C resp failure s/p trach 2 Delirium/ agitation - ICU and/or meds 3 Vol excess- is at risk for pulm edema with daily fluid  requirements, may need extra HD at [facility] , have d[facility]w [facility]. Suspect a good goal weight for him is around 138kg which is his previous dry wt.  3 ESRD sp CRRT 4 Bivent HF LV 30%/ RV dysfxn/ PAH 5 MBD Ca/P stable 6 Anemia Hb down 7.9, give darbe 100 / wk and check fe/tibc 7 Met acidosis - improved w HD 8 Hypotension - added midodrine per NG  Plan - HD today   Kelly Splinter MD [facility] pager [phone]    cell [phone] 04/15/2015, 10:32 AM    Recent Labs Lab 04/13/15 0500 04/14/15 0515 04/15/15 0313  NA 137 136 135  K 3.4* 3.8 3.3*  CL 105 104 100*  CO2 16* 14* 20*  GLUCOSE 129* 154* 147*  BUN 89* 129* 75*  CREATININE 6.77* 9.10* 6.10*  CALCIUM 8.8* 8.4* 8.3*  PHOS 6.2* 8.2* 5.2*    Recent Labs Lab 04/14/15 0515 04/15/15 0313 04/15/15 0550  AST 41  --  33  ALT 26  --  17  ALKPHOS 69  --  61  BILITOT 0.6  --  0.9  PROT 5.8*  --  5.8*  ALBUMIN 2.4* 2.3* 2.3*    Recent Labs Lab 04/10/15 0436  04/13/15 0500 04/14/15 0515 04/15/15 0550  WBC 11.0*  < > 6.7 7.0 5.9  NEUTROABS 8.1*  --   --   --   --   HGB 9.1*  < > 8.7* 7.9* 7.7*  HCT 29.9*  < > 27.2* 24.9* 23.3*  MCV 95.8  < > 91.3 90.2 88.6  PLT 69*  < > 81* 87* 81*  < > = values in this interval not displayed.

## 2015-04-16 ENCOUNTER — Other Ambulatory Visit (HOSPITAL_COMMUNITY): Payer: Self-pay

## 2015-04-16 ENCOUNTER — Encounter (HOSPITAL_COMMUNITY): Payer: Medicare Other

## 2015-04-16 ENCOUNTER — Ambulatory Visit: Payer: Medicare Other | Admitting: Vascular Surgery

## 2015-04-16 DIAGNOSIS — J9622 Acute and chronic respiratory failure with hypercapnia: Secondary | ICD-10-CM

## 2015-04-16 DIAGNOSIS — J810 Acute pulmonary edema: Secondary | ICD-10-CM

## 2015-04-16 DIAGNOSIS — Z992 Dependence on renal dialysis: Secondary | ICD-10-CM

## 2015-04-16 DIAGNOSIS — N186 End stage renal disease: Secondary | ICD-10-CM

## 2015-04-16 LAB — CBC
HEMATOCRIT: 25.9 % — AB (ref 39.0–52.0)
Hemoglobin: 8.3 g/dL — ABNORMAL LOW (ref 13.0–17.0)
MCH: 28.6 pg (ref 26.0–34.0)
MCHC: 32 g/dL (ref 30.0–36.0)
MCV: 89.3 fL (ref 78.0–100.0)
Platelets: 86 10*3/uL — ABNORMAL LOW (ref 150–400)
RBC: 2.9 MIL/uL — ABNORMAL LOW (ref 4.22–5.81)
RDW: 17.9 % — AB (ref 11.5–15.5)
WBC: 6 10*3/uL (ref 4.0–10.5)

## 2015-04-16 LAB — BASIC METABOLIC PANEL
Anion gap: 14 (ref 5–15)
BUN: 58 mg/dL — ABNORMAL HIGH (ref 6–20)
CALCIUM: 8.6 mg/dL — AB (ref 8.9–10.3)
CO2: 24 mmol/L (ref 22–32)
CREATININE: 5.17 mg/dL — AB (ref 0.61–1.24)
Chloride: 99 mmol/L — ABNORMAL LOW (ref 101–111)
GFR calc Af Amer: 13 mL/min — ABNORMAL LOW (ref >60)
GFR calc non Af Amer: 11 mL/min — ABNORMAL LOW (ref >60)
GLUCOSE: 151 mg/dL — AB (ref 65–99)
Potassium: 3.7 mmol/L (ref 3.5–5.1)
Sodium: 137 mmol/L (ref 135–145)

## 2015-04-16 NOTE — Consult Note (Signed)
Name: Gary Rivera MRN: [id number] DOB: 20-Aug-1960    ADMISSION DATE:  04/15/2015 CONSULTATION DATE:  04/16/15  REFERRING MD :  Dr. Sharyon Medicus   CHIEF COMPLAINT:  Tracheostomy dependent respiratory failure    HISTORY OF PRESENT ILLNESS:  55 y/o M with PMH of medical non-compliance (the patient missed HD prior to admit) with frequent admissions for hypercarbic respiratory failure, ESRD on HD (dry weight ~ 138 kg), CHF, anemia, anxiety, HTN, and chronic respiratory failure and recent admission to [facility] from 1/8-1/25/17 for acute on chronic hypoxic / hypercapnic respiratory failure, pulmonary edema s/p tracheostomy.  Hospital course complicated by agitation / psychosis, hypotension requiring vasopressors / stress dose steroids (ultimately was placed on midodrine), acidosis, inability to wean from mechanical ventilation and trach was placed and a left anti-cubital wound infection.  He was transferred to [facility] for further weaning on 1/25.    PCCM consulted for evaluation and assistance with ventilator weaning.    PAST MEDICAL HISTORY :   has a past medical history of ESRD on hemodialysis (HCC); CHF (congestive heart failure) (HCC); Peritoneal dialysis catheter in place [facility]); Anemia; Anxiety state, unspecified; and Hypertension.  has past surgical history that includes Appendectomy; Peritoneal catheter insertion; TEE without cardioversion (N/A, 07/11/2013); Cardioversion (N/A, 07/11/2013); Knee arthroscopy (Right, 05/18/2014); AV fistula placement (Left, 02/18/2015); Insertion of dialysis catheter (Right, 02/18/2015); Central venous catheter insertion; and Tracheostomy tube placement (N/A, 04/10/2015).   Prior to Admission medications   Medication Sig Start Date End Date Taking? Authorizing Provider  albuterol (PROVENTIL) (2.5 MG/3ML) 0.083% nebulizer solution Take 3 mLs (2.5 mg total) by nebulization every 3 (three) hours as needed for wheezing. 04/15/15   Otis Brace, MD  Amino Acids-Protein Hydrolys  (FEEDING SUPPLEMENT, PRO-STAT SUGAR FREE 64,) LIQD Place 60 mLs into feeding tube 5 (five) times daily. 04/15/15   Otis Brace, MD  chlorhexidine gluconate (PERIDEX) 0.12 % solution 15 mLs by Mouth Rinse route 2 (two) times daily. 04/15/15   Otis Brace, MD  Darbepoetin Alfa (ARANESP) 100 MCG/0.5ML SOSY injection Inject 0.5 mLs (100 mcg total) into the vein every Wednesday with hemodialysis. 04/22/15   Otis Brace, MD  dexmedetomidine (PRECEDEX) 400 MCG/100ML SOLN Inject 53.6-160.8 mcg/hr into the vein continuous. 04/15/15   Otis Brace, MD  doxercalciferol (HECTOROL) 4 MCG/2ML injection Inject 1.5 mLs (3 mcg total) into the vein Every Tuesday,Thursday,and Saturday with dialysis. 04/15/15   Otis Brace, MD  fentaNYL (DURAGESIC - DOSED MCG/HR) 75 MCG/HR Place 1 patch (75 mcg total) onto the skin every 3 (three) days. 04/17/15   Otis Brace, MD  fentaNYL 2,500 mcg in sodium chloride 0.9 % 200 mL Inject 10 mcg/hr into the vein continuous. 04/15/15   Otis Brace, MD  hydrocortisone sodium succinate (SOLU-CORTEF) 100 MG SOLR injection Inject 1 mL (50 mg total) into the vein every 12 (twelve) hours. 04/15/15   Otis Brace, MD  hydrocortisone sodium succinate (SOLU-CORTEF) 100 MG SOLR injection Inject 1 mL (50 mg total) into the vein daily. 04/18/15   Otis Brace, MD  LORazepam (ATIVAN) 2 MG/ML injection Inject 1 mL (2 mg total) into the vein every 6 (six) hours. 04/15/15   Otis Brace, MD  midazolam (VERSED) 2 MG/2ML SOLN injection Inject 1 mL (1 mg total) into the vein every 2 (two) hours as needed for agitation. 04/15/15   Otis Brace, MD  midodrine (PROAMATINE) 10 MG tablet Take 1 tablet (10 mg total) by mouth 3 (three) times daily before meals. 04/15/15   Otis Brace, MD  multivitamin (RENA-VIT)  TABS tablet Take 1 tablet by mouth at bedtime. 05/21/14   [name], DO  Nutritional Supplements (FEEDING SUPPLEMENT, NEPRO CARB STEADY,) LIQD Place 1,000 mLs into feeding tube daily.  04/15/15   Otis Brace, MD  pantoprazole sodium (PROTONIX) 40 mg/20 mL PACK Place 20 mLs (40 mg total) into feeding tube at bedtime. 04/15/15   Otis Brace, MD  QUEtiapine (SEROQUEL) 200 MG tablet Place 1 tablet (200 mg total) into feeding tube daily. 04/15/15   Otis Brace, MD  sennosides (SENOKOT) 8.8 MG/5ML syrup Take 5 mLs by mouth 2 (two) times daily. 04/15/15   Otis Brace, MD  sodium chloride 0.9 % infusion Inject 100 mLs into the vein as needed (symptomatic hypotension or decrease in SBP < 90 mmHg). 04/15/15   Otis Brace, MD  sodium chloride 0.9 % infusion Inject 100 mLs into the vein as needed (severe cramping). 04/15/15   Otis Brace, MD  sodium chloride 0.9 % infusion Inject 10 mLs into the vein continuous. 04/15/15   Otis Brace, MD  sodium chloride 0.9 % injection 10-40 mLs by Intracatheter route as needed (flush). 04/15/15   Otis Brace, MD  valproate 500 mg in dextrose 5 % 50 mL Inject 500 mg into the vein 3 (three) times daily. 04/15/15   Otis Brace, MD   Allergies  Allergen Reactions  . Renvela [Sevelamer] Nausea And Vomiting    FAMILY HISTORY:  family history includes Asthma in his sister; Diabetes in his mother; Hypertension in his mother and sister.   SOCIAL HISTORY:  reports that he has never smoked. He has never used smokeless tobacco. He reports that he does not drink alcohol or use illicit drugs.  REVIEW OF SYSTEMS:   Unable to complete as patient is sedated on mechanical ventilation.   SUBJECTIVE: RN reports significant agitation, hitting staff when sedation reduced.    VITAL SIGNS: Pulse Rate:  [72-77] 72 (01/25 1400) Resp:  [30] 30 (01/25 1400) BP: (113)/(67-71) 113/67 mmHg (01/25 1400) SpO2:  [93 %-94 %] 93 % (01/25 1400)  PHYSICAL EXAMINATION: General:  Obese adult male in NAD Neuro:  Sedate, moves all ext's spontanteously HEENT:  MM pink/moist, trach midline, c/d/i, NGT in place Cardiovascular:  s1s2 rrr, no m/r/g, SR on monitor    Lungs:  resp's even/non-labored, lungs bilaterally coarse  Abdomen:  Obese, soft, bsx4 active Musculoskeletal:  No acute deformities  Skin:  Warm/dry, trace LE edema    Recent Labs Lab 04/14/15 0515 04/15/15 0313 04/16/15 0550  NA 136 135 137  K 3.8 3.3* 3.7  CL 104 100* 99*  CO2 14* 20* 24  BUN 129* 75* 58*  CREATININE 9.10* 6.10* 5.17*  GLUCOSE 154* 147* 151*    Recent Labs Lab 04/14/15 0515 04/15/15 0550 04/16/15 0550  HGB 7.9* 7.7* 8.3*  HCT 24.9* 23.3* 25.9*  WBC 7.0 5.9 6.0  PLT 87* 81* 86*   Dg Chest Port 1 View  04/16/2015  CLINICAL DATA:  Respiratory failure, end-stage renal disease EXAM: PORTABLE CHEST 1 VIEW COMPARISON:  Portable chest x-ray of April 14, 2015 FINDINGS: The lungs are hypo inflated. There is persistent infiltrate or atelectasis at the right lung base. Atelectasis in the retrocardiac region on the left is stable. The left hemidiaphragm is slightly better visualized today. There is no large pleural effusion. The cardiac silhouette remains enlarged. The pulmonary vascularity remains engorged centrally. The tracheostomy appliance the tip projects at the inferior margin of the clavicular heads. The left internal jugular venous catheter tip projects over  the proximal SVC. The dialysis catheter tip projects over the midportion of the SVC. IMPRESSION: Slight interval improvement in the appearance of the pulmonary interstitium bilaterally. Stable bibasilar atelectasis or infiltrate. Stable cardiomegaly and central pulmonary vascular congestion. The support tubes are in reasonable position radiographically. Electronically Signed   By: David  Swaz[place] M.D.   On: 04/16/2015 07:29   Dg Abd Portable 1v  04/15/2015  CLINICAL DATA:  NG tube placement. EXAM: PORTABLE ABDOMEN - 1 VIEW COMPARISON:  04/10/2015 FINDINGS: Enteric catheter seen overlying the expected location of gastric cardia with side hole at the level of the GE junction. The bowel gas pattern is normal.  Punctate radiopaque densities seen throughout the abdomen are likely within the fecal material. Surgical clips noted in the right mid abdomen. IMPRESSION: Enteric catheter with side hole at the level of the expected location of the GE junction. Advancement may be considered. Electronically Signed   By: Ted Mcalpine M.D.   On: 04/15/2015 18:55    SIGNIFICANT EVENTS  1/8-1/25  Admit to [facility] for acute on chronic hypoxemic / hypercapnic respiratory failure, s/p trach 1/25  Tx to [facility] for vent weaning  1/26  PCCM consulted for evaluation   STUDIES:      ASSESSMENT / PLAN:  Acute on Chronic Hypoxemic / Hypercarbic Respiratory Failure - secondary to pulmonary edema, medical non-compliance, CHF, suspected OSA ?OHS Pulmonary Edema  Suspected OSA / OHS  CHF - last ECHO 03/26/15 with EF 35-40%, diffuse hypokinesis, grade 1 diastolic dysfunction, PA Peak pressure 41 PAH - noted on ECHO above Medical Non-compliance   Plan: MV support, 8 cc/kg Begin vent wean protocol, goal ATC as tolerated.  ? If he will need nocturnal support.   Wean PEEP / FiO2 for sats > 90% Lighten sedation to allow for weaning Negative balance as tolerated  Trach care per protocol  Intermittent CXR  Push PT efforts, mobilize patient as able  Placement will be a difficult issue for Mr. Smock with ESRD and tracheostomy.  Will work toward ultimate decannulation if possible but in the past he has been non-compliant with therapies.    Other Medical Issues per [facility]  Thrombocytopenia  Anemia  Coagulopathy  Chronic Hypotension  ESRD on HD  Protein Calorie Malnutrition    [name], NP-C [facility] Pgr: [phone] or if no answer [phone] 04/16/2015, 12:15 PM

## 2015-04-16 NOTE — Progress Notes (Signed)
Patient ID: Gary Rivera, male   DOB: Jul 21, 1960, 55 y.o.   MRN: [id number]   Pt was on schedule for IR placement of percutaneous gastric tube  Procedure has been put on HOLD until pt is less combative and agitated  Not only do we need pt to tolerate procedure---will need to be assured he will not pull out or dislodge tube post procedure. G tube must stay in 5- 6 weeks at minimum before safely can remove  Please re order when appropriate if needed

## 2015-04-17 ENCOUNTER — Other Ambulatory Visit (HOSPITAL_COMMUNITY): Payer: Self-pay

## 2015-04-17 LAB — FERRITIN: Ferritin: 851 ng/mL — ABNORMAL HIGH (ref 24–336)

## 2015-04-17 LAB — RENAL FUNCTION PANEL
Albumin: 2.8 g/dL — ABNORMAL LOW (ref 3.5–5.0)
Anion gap: 16 — ABNORMAL HIGH (ref 5–15)
BUN: 91 mg/dL — ABNORMAL HIGH (ref 6–20)
CHLORIDE: 101 mmol/L (ref 101–111)
CO2: 22 mmol/L (ref 22–32)
CREATININE: 7.47 mg/dL — AB (ref 0.61–1.24)
Calcium: 8.8 mg/dL — ABNORMAL LOW (ref 8.9–10.3)
GFR calc non Af Amer: 7 mL/min — ABNORMAL LOW (ref >60)
GFR, EST AFRICAN AMERICAN: 8 mL/min — AB (ref >60)
GLUCOSE: 105 mg/dL — AB (ref 65–99)
Phosphorus: 5.8 mg/dL — ABNORMAL HIGH (ref 2.5–4.6)
Potassium: 3.8 mmol/L (ref 3.5–5.1)
SODIUM: 139 mmol/L (ref 135–145)

## 2015-04-17 LAB — CBC
HCT: 25.6 % — ABNORMAL LOW (ref 39.0–52.0)
Hemoglobin: 8.3 g/dL — ABNORMAL LOW (ref 13.0–17.0)
MCH: 29.1 pg (ref 26.0–34.0)
MCHC: 32.4 g/dL (ref 30.0–36.0)
MCV: 89.8 fL (ref 78.0–100.0)
PLATELETS: 125 10*3/uL — AB (ref 150–400)
RBC: 2.85 MIL/uL — AB (ref 4.22–5.81)
RDW: 17.9 % — ABNORMAL HIGH (ref 11.5–15.5)
WBC: 11.1 10*3/uL — ABNORMAL HIGH (ref 4.0–10.5)

## 2015-04-17 LAB — IRON AND TIBC
IRON: 16 ug/dL — AB (ref 45–182)
SATURATION RATIOS: 8 % — AB (ref 17.9–39.5)
TIBC: 206 ug/dL — ABNORMAL LOW (ref 250–450)
UIBC: 190 ug/dL

## 2015-04-17 LAB — TRANSFERRIN: TRANSFERRIN: 147 mg/dL — AB (ref 180–329)

## 2015-04-17 LAB — HEPARIN INDUCED PLATELET AB (HIT ANTIBODY): Heparin Induced Plt Ab: 0.552 OD — ABNORMAL HIGH (ref 0.000–0.400)

## 2015-04-17 NOTE — Progress Notes (Signed)
Subjective:   Patient remains critically ill Agitated. Requiring sedatives and soft restraints Remains on vent Fio2 60%/ PEEP 5    Objective:  Vital signs in last 24 hours:   T 100.3 BP 151/91  HR 117  sats 91 %  Weight change:  There were no vitals filed for this visit.  Intake/Output:   No intake or output data in the 24 hours ending 04/17/15 0923   Physical Exam: General: Laying in bed, chronically ill appearing  HEENT NGT  Neck trach  Pulm/lungs Vent assisted, coarse breath sounds  CVS/Heart No rub  Abdomen:  Soft, non didtended  Extremities: Trace edema  Neurologic: sedated  Skin: No acute rashes  Access: Rt IJ PC       Basic Metabolic Panel:   Recent Labs Lab 04/11/15 0427  04/12/15 0424 04/12/15 0425 04/13/15 0500 04/14/15 0515 04/15/15 0313 04/16/15 0550  NA 141  147*  < >  --  141 137 136 135 137  K 3.0*  2.7*  < >  --  2.8* 3.4* 3.8 3.3* 3.7  CL 112*  112*  < >  --  108 105 104 100* 99*  CO2 17*  --   --  17* 16* 14* 20* 24  GLUCOSE 90  133*  < >  --  102* 129* 154* 147* 151*  BUN 28*  23*  < >  --  49* 89* 129* 75* 58*  CREATININE 2.64*  1.80*  < >  --  4.31* 6.77* 9.10* 6.10* 5.17*  CALCIUM 8.1*  --   --  8.9 8.8* 8.4* 8.3* 8.6*  MG 1.6*  --  1.8  --  2.0 2.2  --   --   PHOS 2.6  --  3.5 3.5 6.2* 8.2* 5.2*  --   < > = values in this interval not displayed.   CBC:  Recent Labs Lab 04/13/15 0500 04/14/15 0515 04/15/15 0550 04/16/15 0550 04/17/15 0643  WBC 6.7 7.0 5.9 6.0 11.1*  HGB 8.7* 7.9* 7.7* 8.3* 8.3*  HCT 27.2* 24.9* 23.3* 25.9* 25.6*  MCV 91.3 90.2 88.6 89.3 89.8  PLT 81* 87* 81* 86* 125*      Microbiology:  Recent Results (from the past 720 hour(s))  Blood culture (routine x 2)     Status: None   Collection Time: 03/24/15 12:52 PM  Result Value Ref Range Status   Specimen Description BLOOD LEFT ANTECUBITAL  Final   Special Requests IN PEDIATRIC BOTTLE 1CCS  Final   Culture NO GROWTH 5 DAYS  Final   Report  Status 03/30/2015 FINAL  Final  Blood culture (routine x 2)     Status: None   Collection Time: 03/24/15  1:21 PM  Result Value Ref Range Status   Specimen Description BLOOD RIGHT HAND  Final   Special Requests BOTTLES DRAWN AEROBIC AND ANAEROBIC 5CC  Final   Culture NO GROWTH 5 DAYS  Final   Report Status 03/29/2015 FINAL  Final  MRSA PCR Screening     Status: None   Collection Time: 03/24/15  5:20 PM  Result Value Ref Range Status   MRSA by PCR NEGATIVE NEGATIVE Final    Comment:        The GeneXpert MRSA Assay (FDA approved for NASAL specimens only), is one component of a comprehensive MRSA colonization surveillance program. It is not intended to diagnose MRSA infection nor to guide or monitor treatment for MRSA infections.   Culture, blood (routine x 2)  Status: None   Collection Time: 03/29/15 12:05 PM  Result Value Ref Range Status   Specimen Description BLOOD RIGHT ANTECUBITAL  Final   Special Requests BOTTLES DRAWN AEROBIC AND ANAEROBIC  Final   Culture NO GROWTH 5 DAYS  Final   Report Status 04/03/2015 FINAL  Final  Culture, blood (routine x 2)     Status: None   Collection Time: 03/29/15 12:42 PM  Result Value Ref Range Status   Specimen Description BLOOD RIGHT ANTECUBITAL  Final   Special Requests BOTTLES DRAWN AEROBIC AND ANAEROBIC 3CCS  Final   Culture NO GROWTH 5 DAYS  Final   Report Status 04/03/2015 FINAL  Final  MRSA PCR Screening     Status: None   Collection Time: 03/29/15  5:48 PM  Result Value Ref Range Status   MRSA by PCR NEGATIVE NEGATIVE Final    Comment:        The GeneXpert MRSA Assay (FDA approved for NASAL specimens only), is one component of a comprehensive MRSA colonization surveillance program. It is not intended to diagnose MRSA infection nor to guide or monitor treatment for MRSA infections.   Respiratory virus panel     Status: None   Collection Time: 04/01/15  4:22 PM  Result Value Ref Range Status   Respiratory  Syncytial Virus A Negative Negative Final   Respiratory Syncytial Virus B Negative Negative Final   Influenza A Negative Negative Final   Influenza B Negative Negative Final   Parainfluenza 1 Negative Negative Final   Parainfluenza 2 Negative Negative Final   Parainfluenza 3 Negative Negative Final   Metapneumovirus Negative Negative Final   Rhinovirus Negative Negative Final   Adenovirus Negative Negative Final    Comment: (NOTE) Performed At: [facility] [address] [place], [place] [id number] Mila Homer MD PX:[id number]   Culture, respiratory (tracheal aspirate)     Status: None   Collection Time: 04/02/15 12:15 AM  Result Value Ref Range Status   Specimen Description TRACHEAL ASPIRATE  Final   Special Requests Normal  Final   Gram Stain   Final    RARE WBC PRESENT, PREDOMINANTLY MONONUCLEAR NO SQUAMOUS EPITHELIAL CELLS SEEN NO ORGANISMS SEEN Performed at [facility]    Culture   Final    NORMAL OROPHARYNGEAL FLORA Performed at [facility]    Report Status 04/05/2015 FINAL  Final    Coagulation Studies:  Recent Labs  04/15/15 0550  LABPROT 22.8*  INR 2.03*    Urinalysis: No results for input(s): COLORURINE, LABSPEC, PHURINE, GLUCOSEU, HGBUR, BILIRUBINUR, KETONESUR, PROTEINUR, UROBILINOGEN, NITRITE, LEUKOCYTESUR in the last 72 hours.  Invalid input(s): APPERANCEUR    Imaging: Dg Chest Port 1 View  04/17/2015  CLINICAL DATA:  Respiratory failure, chronic CHF as well as pulmonary hypertension, mitral regurgitation, end-stage renal disease EXAM: PORTABLE CHEST 1 VIEW COMPARISON:  Portable chest x-ray of April 16, 2015 FINDINGS: There is chronic elevation of the right hemidiaphragm. There is persistent increased density in the minor fissure and right perihilar region that appears slightly less conspicuous today. Left retrocardiac atelectasis or pneumonia is less conspicuous today. The cardiac silhouette remains enlarged. The  central pulmonary vascularity is engorged. The tracheostomy appliance tip lies at the level of the inferior margin of the clavicular heads. The esophagogastric tube tip projects below the GE junction. The left internal jugular venous catheter tip projects at the level of the proximal SVC. The dual-lumen dialysis type catheter has its distal port near the cavoatrial junction. IMPRESSION:  Slight interval improvement in bibasilar atelectasis or infiltrate. Stable cardiomegaly and central pulmonary vascular congestion. The support tubes are in reasonable position. Electronically Signed   By: David  Swaziland M.D.   On: 04/17/2015 07:20   Dg Chest Port 1 View  04/16/2015  CLINICAL DATA:  Respiratory failure, end-stage renal disease EXAM: PORTABLE CHEST 1 VIEW COMPARISON:  Portable chest x-ray of April 14, 2015 FINDINGS: The lungs are hypo inflated. There is persistent infiltrate or atelectasis at the right lung base. Atelectasis in the retrocardiac region on the left is stable. The left hemidiaphragm is slightly better visualized today. There is no large pleural effusion. The cardiac silhouette remains enlarged. The pulmonary vascularity remains engorged centrally. The tracheostomy appliance the tip projects at the inferior margin of the clavicular heads. The left internal jugular venous catheter tip projects over the proximal SVC. The dialysis catheter tip projects over the midportion of the SVC. IMPRESSION: Slight interval improvement in the appearance of the pulmonary interstitium bilaterally. Stable bibasilar atelectasis or infiltrate. Stable cardiomegaly and central pulmonary vascular congestion. The support tubes are in reasonable position radiographically. Electronically Signed   By: David  [place]azil[place] M.D.   On: 04/16/2015 07:29   Dg Abd Portable 1v  04/15/2015  CLINICAL DATA:  NG tube placement. EXAM: PORTABLE ABDOMEN - 1 VIEW COMPARISON:  04/10/2015 FINDINGS: Enteric catheter seen overlying the expected  location of gastric cardia with side hole at the level of the GE junction. The bowel gas pattern is normal. Punctate radiopaque densities seen throughout the abdomen are likely within the fecal material. Surgical clips noted in the right mid abdomen. IMPRESSION: Enteric catheter with side hole at the level of the expected location of the GE junction. Advancement may be considered. Electronically Signed   By: Ted Mcalpine M.D.   On: 04/15/2015 18:55     Medications:       Assessment/ Plan:  55 y.o. male  with a PMHX of ESRD, hypertension, congestive heart failure, was admitted on 04/15/2015  He normally dialyzes at the [facility]  1. ESRD 2. Acute respiratory failure- Currently ventilator dependent 3. Anemia of chronic kidney disease- current hemoglobin 8.3. t sats low at 8% 4. Secondary hyperparathyroidism 5. Thrombocytopenia 6. Hypokalemia- expected to correct with tube feeds  Plan: Dialysis Monday/Wednesday/Friday Check for heparin antibodies. Pack catheter with citrate until negative results Low dose iv iron with HD    LOS:  Myers [place]ow 1/27/20179:23 AM

## 2015-04-18 LAB — HEPATITIS B SURFACE ANTIBODY,QUALITATIVE: HEP B S AB: REACTIVE

## 2015-04-18 LAB — VALPROIC ACID LEVEL: Valproic Acid Lvl: 25 ug/mL — ABNORMAL LOW (ref 50.0–100.0)

## 2015-04-18 LAB — HEPATITIS B SURFACE ANTIGEN: HEP B S AG: NEGATIVE

## 2015-04-18 LAB — HEPATITIS B CORE ANTIBODY, TOTAL: HEP B C TOTAL AB: NEGATIVE

## 2015-04-20 DIAGNOSIS — J9600 Acute respiratory failure, unspecified whether with hypoxia or hypercapnia: Secondary | ICD-10-CM

## 2015-04-20 LAB — RENAL FUNCTION PANEL
Albumin: 2.4 g/dL — ABNORMAL LOW (ref 3.5–5.0)
Anion gap: 18 — ABNORMAL HIGH (ref 5–15)
BUN: 107 mg/dL — ABNORMAL HIGH (ref 6–20)
CALCIUM: 8.4 mg/dL — AB (ref 8.9–10.3)
CO2: 25 mmol/L (ref 22–32)
CREATININE: 9.96 mg/dL — AB (ref 0.61–1.24)
Chloride: 93 mmol/L — ABNORMAL LOW (ref 101–111)
GFR calc non Af Amer: 5 mL/min — ABNORMAL LOW (ref >60)
GFR, EST AFRICAN AMERICAN: 6 mL/min — AB (ref >60)
Glucose, Bld: 121 mg/dL — ABNORMAL HIGH (ref 65–99)
Phosphorus: 7.1 mg/dL — ABNORMAL HIGH (ref 2.5–4.6)
Potassium: 4.4 mmol/L (ref 3.5–5.1)
SODIUM: 136 mmol/L (ref 135–145)

## 2015-04-20 LAB — CBC
HCT: 23.9 % — ABNORMAL LOW (ref 39.0–52.0)
Hemoglobin: 7.7 g/dL — ABNORMAL LOW (ref 13.0–17.0)
MCH: 28.8 pg (ref 26.0–34.0)
MCHC: 32.2 g/dL (ref 30.0–36.0)
MCV: 89.5 fL (ref 78.0–100.0)
PLATELETS: 161 10*3/uL (ref 150–400)
RBC: 2.67 MIL/uL — AB (ref 4.22–5.81)
RDW: 18.2 % — ABNORMAL HIGH (ref 11.5–15.5)
WBC: 11.1 10*3/uL — ABNORMAL HIGH (ref 4.0–10.5)

## 2015-04-20 NOTE — Progress Notes (Signed)
Subjective:  Patient seen and evaluated during dialysis. Blood flow rate 400. Remains on the ventilator with FiO2 of 50% and PEEP of 5. Still in soft restraints.     Objective:  Vital signs in last 24 hours:  Temperature 97.5 pulse 93 respirations 24 blood pressure 131/61  Physical Exam: General: Laying in bed, chronically ill appearing  HEENT NGT  Neck Trach in place  Pulm/lungs Vent assisted, coarse breath sounds  CVS/Heart S1S2 no rubs  Abdomen:  Soft, mild distension, BS present  Extremities: Trace edema  Neurologic: Awake, but not following commands  Skin: No acute rashes  Access: Rt IJ PC       Basic Metabolic Panel:   Recent Labs Lab 04/14/15 0515 04/15/15 0313 04/16/15 0550 04/17/15 0643 04/20/15 0502  NA 136 135 137 139 136  K 3.8 3.3* 3.7 3.8 4.4  CL 104 100* 99* 101 93*  CO2 14* 20* 24 22 25   GLUCOSE 154* 147* 151* 105* 121*  BUN 129* 75* 58* 91* 107*  CREATININE 9.10* 6.10* 5.17* 7.47* 9.96*  CALCIUM 8.4* 8.3* 8.6* 8.8* 8.4*  MG 2.2  --   --   --   --   PHOS 8.2* 5.2*  --  5.8* 7.1*     CBC:  Recent Labs Lab 04/14/15 0515 04/15/15 0550 04/16/15 0550 04/17/15 0643 04/20/15 0502  WBC 7.0 5.9 6.0 11.1* 11.1*  HGB 7.9* 7.7* 8.3* 8.3* 7.7*  HCT 24.9* 23.3* 25.9* 25.6* 23.9*  MCV 90.2 88.6 89.3 89.8 89.5  PLT 87* 81* 86* 125* 161      Microbiology:  Recent Results (from the past 720 hour(s))  Blood culture (routine x 2)     Status: None   Collection Time: 03/24/15 12:52 PM  Result Value Ref Range Status   Specimen Description BLOOD LEFT ANTECUBITAL  Final   Special Requests IN PEDIATRIC BOTTLE 1CCS  Final   Culture NO GROWTH 5 DAYS  Final   Report Status 03/30/2015 FINAL  Final  Blood culture (routine x 2)     Status: None   Collection Time: 03/24/15  1:21 PM  Result Value Ref Range Status   Specimen Description BLOOD RIGHT HAND  Final   Special Requests BOTTLES DRAWN AEROBIC AND ANAEROBIC 5CC  Final   Culture NO GROWTH 5 DAYS   Final   Report Status 03/29/2015 FINAL  Final  MRSA PCR Screening     Status: None   Collection Time: 03/24/15  5:20 PM  Result Value Ref Range Status   MRSA by PCR NEGATIVE NEGATIVE Final    Comment:        The GeneXpert MRSA Assay (FDA approved for NASAL specimens only), is one component of a comprehensive MRSA colonization surveillance program. It is not intended to diagnose MRSA infection nor to guide or monitor treatment for MRSA infections.   Culture, blood (routine x 2)     Status: None   Collection Time: 03/29/15 12:05 PM  Result Value Ref Range Status   Specimen Description BLOOD RIGHT ANTECUBITAL  Final   Special Requests BOTTLES DRAWN AEROBIC AND ANAEROBIC  Final   Culture NO GROWTH 5 DAYS  Final   Report Status 04/03/2015 FINAL  Final  Culture, blood (routine x 2)     Status: None   Collection Time: 03/29/15 12:42 PM  Result Value Ref Range Status   Specimen Description BLOOD RIGHT ANTECUBITAL  Final   Special Requests BOTTLES DRAWN AEROBIC AND ANAEROBIC 3CCS  Final   Culture  NO GROWTH 5 DAYS  Final   Report Status 04/03/2015 FINAL  Final  MRSA PCR Screening     Status: None   Collection Time: 03/29/15  5:48 PM  Result Value Ref Range Status   MRSA by PCR NEGATIVE NEGATIVE Final    Comment:        The GeneXpert MRSA Assay (FDA approved for NASAL specimens only), is one component of a comprehensive MRSA colonization surveillance program. It is not intended to diagnose MRSA infection nor to guide or monitor treatment for MRSA infections.   Respiratory virus panel     Status: None   Collection Time: 04/01/15  4:22 PM  Result Value Ref Range Status   Respiratory Syncytial Virus A Negative Negative Final   Respiratory Syncytial Virus B Negative Negative Final   Influenza A Negative Negative Final   Influenza B Negative Negative Final   Parainfluenza 1 Negative Negative Final   Parainfluenza 2 Negative Negative Final   Parainfluenza 3 Negative  Negative Final   Metapneumovirus Negative Negative Final   Rhinovirus Negative Negative Final   Adenovirus Negative Negative Final    Comment: (NOTE) Performed At: [facility] [address] [place], [place] [id number] Mila Homer MD WU:[id number]   Culture, respiratory (tracheal aspirate)     Status: None   Collection Time: 04/02/15 12:15 AM  Result Value Ref Range Status   Specimen Description TRACHEAL ASPIRATE  Final   Special Requests Normal  Final   Gram Stain   Final    RARE WBC PRESENT, PREDOMINANTLY MONONUCLEAR NO SQUAMOUS EPITHELIAL CELLS SEEN NO ORGANISMS SEEN Performed at [facility]    Culture   Final    NORMAL OROPHARYNGEAL FLORA Performed at [facility]    Report Status 04/05/2015 FINAL  Final    Coagulation Studies: No results for input(s): LABPROT, INR in the last 72 hours.  Urinalysis: No results for input(s): COLORURINE, LABSPEC, PHURINE, GLUCOSEU, HGBUR, BILIRUBINUR, KETONESUR, PROTEINUR, UROBILINOGEN, NITRITE, LEUKOCYTESUR in the last 72 hours.  Invalid input(s): APPERANCEUR    Imaging: No results found.   Medications:       Assessment/ Plan:  55 y.o. male  with a PMHX of ESRD, hypertension, congestive heart failure, was admitted on 04/15/2015  He [facility] dialyzes at the [facility]  1. ESRD 2. Acute respiratory failure- Currently ventilator dependent 3. Anemia of chronic kidney disease- current hemoglobin down to 7.7 4. Secondary hyperparathyroidism 5. Thrombocytopenia, use citrate in catheter. 6. Hypokalemia  Plan: Patient seen during hemodialysis. Blood work currently 400 with ultrafiltration target was increased to 2.5 kg. Hemoglobin drifting down and currently 7.7. Would consider transfusion for hemoglobin of 7 or less. Postures only 7.1 but expected to be high over the weekend. Continue to monitor. Continue to pack catheter with citrate.    LOS:  Zackaria Burkey,  Skyelar Halliday 1/30/20173:17 PM

## 2015-04-20 NOTE — Consult Note (Signed)
Name: Gary Rivera MRN: [id number] DOB: 20-Aug-1960    ADMISSION DATE:  04/15/2015 CONSULTATION DATE:  04/16/15  REFERRING MD :  Dr. Sharyon Medicus   CHIEF COMPLAINT:  Tracheostomy dependent respiratory failure    HISTORY OF PRESENT ILLNESS:  55 y/o M with PMH of medical non-compliance (the patient missed HD prior to admit) with frequent admissions for hypercarbic respiratory failure, ESRD on HD (dry weight ~ 138 kg), CHF, anemia, anxiety, HTN, and chronic respiratory failure and recent admission to [facility] from 1/8-1/25/17 for acute on chronic hypoxic / hypercapnic respiratory failure, pulmonary edema s/p tracheostomy.  Hospital course complicated by agitation / psychosis, hypotension requiring vasopressors / stress dose steroids (ultimately was placed on midodrine), acidosis, inability to wean from mechanical ventilation and trach was placed and a left anti-cubital wound infection.  He was transferred to [facility] for further weaning on 1/25.    PCCM consulted for evaluation and assistance with ventilator weaning.    PAST MEDICAL HISTORY :   has a past medical history of ESRD on hemodialysis (HCC); CHF (congestive heart failure) (HCC); Peritoneal dialysis catheter in place [facility]); Anemia; Anxiety state, unspecified; and Hypertension.  has past surgical history that includes Appendectomy; Peritoneal catheter insertion; TEE without cardioversion (N/A, 07/11/2013); Cardioversion (N/A, 07/11/2013); Knee arthroscopy (Right, 05/18/2014); AV fistula placement (Left, 02/18/2015); Insertion of dialysis catheter (Right, 02/18/2015); Central venous catheter insertion; and Tracheostomy tube placement (N/A, 04/10/2015).   Prior to Admission medications   Medication Sig Start Date End Date Taking? Authorizing Provider  albuterol (PROVENTIL) (2.5 MG/3ML) 0.083% nebulizer solution Take 3 mLs (2.5 mg total) by nebulization every 3 (three) hours as needed for wheezing. 04/15/15   Otis Brace, MD  Amino Acids-Protein Hydrolys  (FEEDING SUPPLEMENT, PRO-STAT SUGAR FREE 64,) LIQD Place 60 mLs into feeding tube 5 (five) times daily. 04/15/15   Otis Brace, MD  chlorhexidine gluconate (PERIDEX) 0.12 % solution 15 mLs by Mouth Rinse route 2 (two) times daily. 04/15/15   Otis Brace, MD  Darbepoetin Alfa (ARANESP) 100 MCG/0.5ML SOSY injection Inject 0.5 mLs (100 mcg total) into the vein every Wednesday with hemodialysis. 04/22/15   Otis Brace, MD  dexmedetomidine (PRECEDEX) 400 MCG/100ML SOLN Inject 53.6-160.8 mcg/hr into the vein continuous. 04/15/15   Otis Brace, MD  doxercalciferol (HECTOROL) 4 MCG/2ML injection Inject 1.5 mLs (3 mcg total) into the vein Every Tuesday,Thursday,and Saturday with dialysis. 04/15/15   Otis Brace, MD  fentaNYL (DURAGESIC - DOSED MCG/HR) 75 MCG/HR Place 1 patch (75 mcg total) onto the skin every 3 (three) days. 04/17/15   Otis Brace, MD  fentaNYL 2,500 mcg in sodium chloride 0.9 % 200 mL Inject 10 mcg/hr into the vein continuous. 04/15/15   Otis Brace, MD  hydrocortisone sodium succinate (SOLU-CORTEF) 100 MG SOLR injection Inject 1 mL (50 mg total) into the vein every 12 (twelve) hours. 04/15/15   Otis Brace, MD  hydrocortisone sodium succinate (SOLU-CORTEF) 100 MG SOLR injection Inject 1 mL (50 mg total) into the vein daily. 04/18/15   Otis Brace, MD  LORazepam (ATIVAN) 2 MG/ML injection Inject 1 mL (2 mg total) into the vein every 6 (six) hours. 04/15/15   Otis Brace, MD  midazolam (VERSED) 2 MG/2ML SOLN injection Inject 1 mL (1 mg total) into the vein every 2 (two) hours as needed for agitation. 04/15/15   Otis Brace, MD  midodrine (PROAMATINE) 10 MG tablet Take 1 tablet (10 mg total) by mouth 3 (three) times daily before meals. 04/15/15   Otis Brace, MD  multivitamin (RENA-VIT)  TABS tablet Take 1 tablet by mouth at bedtime. 05/21/14   [name], DO  Nutritional Supplements (FEEDING SUPPLEMENT, NEPRO CARB STEADY,) LIQD Place 1,000 mLs into feeding tube daily.  04/15/15   Otis Brace, MD  pantoprazole sodium (PROTONIX) 40 mg/20 mL PACK Place 20 mLs (40 mg total) into feeding tube at bedtime. 04/15/15   Otis Brace, MD  QUEtiapine (SEROQUEL) 200 MG tablet Place 1 tablet (200 mg total) into feeding tube daily. 04/15/15   Otis Brace, MD  sennosides (SENOKOT) 8.8 MG/5ML syrup Take 5 mLs by mouth 2 (two) times daily. 04/15/15   Otis Brace, MD  sodium chloride 0.9 % infusion Inject 100 mLs into the vein as needed (symptomatic hypotension or decrease in SBP < 90 mmHg). 04/15/15   Otis Brace, MD  sodium chloride 0.9 % infusion Inject 100 mLs into the vein as needed (severe cramping). 04/15/15   Otis Brace, MD  sodium chloride 0.9 % infusion Inject 10 mLs into the vein continuous. 04/15/15   Otis Brace, MD  sodium chloride 0.9 % injection 10-40 mLs by Intracatheter route as needed (flush). 04/15/15   Otis Brace, MD  valproate 500 mg in dextrose 5 % 50 mL Inject 500 mg into the vein 3 (three) times daily. 04/15/15   Otis Brace, MD   Allergies  Allergen Reactions  . Renvela [Sevelamer] Nausea And Vomiting    FAMILY HISTORY:  family history includes Asthma in his sister; Diabetes in his mother; Hypertension in his mother and sister.   SOCIAL HISTORY:  reports that he has never smoked. He has never used smokeless tobacco. He reports that he does not drink alcohol or use illicit drugs.  REVIEW OF SYSTEMS:   Unable to complete as patient is sedated on mechanical ventilation.   SUBJECTIVE: No major issues. Continues on PSV wean.   VITAL SIGNS:   PHYSICAL EXAMINATION: General:  Obese adult male, No distress Neuro:  Sedated, No gross focal deficits HEENT:  Moist mucus membranes, No thyromegaly, JVD Cardiovascular:  S, S2, RRR, No MRG Lungs:  Clear, no wheeze or crackles. Abdomen:  Obese, soft, +BS Musculoskeletal:  No acute deformities  Skin:  No edema   Recent Labs Lab 04/16/15 0550 04/17/15 0643 04/20/15 0502  NA 137 139  136  K 3.7 3.8 4.4  CL 99* 101 93*  CO2 24 22 25   BUN 58* 91* 107*  CREATININE 5.17* 7.47* 9.96*  GLUCOSE 151* 105* 121*    Recent Labs Lab 04/16/15 0550 04/17/15 0643 04/20/15 0502  HGB 8.3* 8.3* 7.7*  HCT 25.9* 25.6* 23.9*  WBC 6.0 11.1* 11.1*  PLT 86* 125* 161   No results found.  SIGNIFICANT EVENTS  1/8-1/25  Admit to [facility] for acute on chronic hypoxemic / hypercapnic respiratory failure, s/p trach 1/25  Tx to [facility] for vent weaning  1/26  PCCM consulted for evaluation   STUDIES:    ASSESSMENT / PLAN:  Acute on Chronic Hypoxemic / Hypercarbic Respiratory Failure - secondary to pulmonary edema, medical non-compliance, CHF, suspected OSA ?OHS Pulmonary Edema  Suspected OSA / OHS  CHF - last ECHO 03/26/15 with EF 35-40%, diffuse hypokinesis, grade 1 diastolic dysfunction, PA Peak pressure 41 PAH - noted on ECHO above Medical Non-compliance   Plan: MV support, 8 cc/kg Continue PSV weans. Tolerated 12/5 for 2 hrs today Negative balance as tolerated  Trach care per protocol  Intermittent CXR  Push PT efforts, mobilize patient as able  Placement will be a difficult issue for Mr.  Ernandez with ESRD and tracheostomy.  Will work toward ultimate decannulation if possible but in the past he has been non-compliant with therapies.    Other Medical Issues per [facility]  Thrombocytopenia  Anemia  Coagulopathy  Chronic Hypotension  ESRD on HD  Protein Calorie Malnutrition   Chilton Greathouse MD [facility] Pager [phone] If no answer or after 3pm call: [phone] 04/20/2015, 11:44 AM

## 2015-04-22 LAB — RENAL FUNCTION PANEL
ALBUMIN: 2.7 g/dL — AB (ref 3.5–5.0)
ANION GAP: 14 (ref 5–15)
BUN: 84 mg/dL — ABNORMAL HIGH (ref 6–20)
CHLORIDE: 94 mmol/L — AB (ref 101–111)
CO2: 26 mmol/L (ref 22–32)
Calcium: 8.8 mg/dL — ABNORMAL LOW (ref 8.9–10.3)
Creatinine, Ser: 9.05 mg/dL — ABNORMAL HIGH (ref 0.61–1.24)
GFR calc Af Amer: 7 mL/min — ABNORMAL LOW (ref >60)
GFR calc non Af Amer: 6 mL/min — ABNORMAL LOW (ref >60)
GLUCOSE: 85 mg/dL (ref 65–99)
PHOSPHORUS: 6 mg/dL — AB (ref 2.5–4.6)
POTASSIUM: 3.9 mmol/L (ref 3.5–5.1)
Sodium: 134 mmol/L — ABNORMAL LOW (ref 135–145)

## 2015-04-22 LAB — CBC
HEMATOCRIT: 26 % — AB (ref 39.0–52.0)
HEMOGLOBIN: 8.2 g/dL — AB (ref 13.0–17.0)
MCH: 29.1 pg (ref 26.0–34.0)
MCHC: 31.5 g/dL (ref 30.0–36.0)
MCV: 92.2 fL (ref 78.0–100.0)
Platelets: 203 10*3/uL (ref 150–400)
RBC: 2.82 MIL/uL — ABNORMAL LOW (ref 4.22–5.81)
RDW: 18.9 % — ABNORMAL HIGH (ref 11.5–15.5)
WBC: 7.6 10*3/uL (ref 4.0–10.5)

## 2015-04-22 NOTE — Progress Notes (Signed)
Subjective:  Pt seen post hemodialysis today. UF achieved was 2.5kg. Still appears restless in bed.    Objective:  Vital signs in last 24 hours:  Temperature 90.8 pulse 80 respirations 24 blood pressure 167/81 pulse is 96%  Physical Exam: General: Laying in bed, chronically ill appearing  HEENT NGT  Neck Trach in place  Pulm/lungs Vent assisted, coarse breath sounds, FiO2 45%  CVS/Heart S1S2 no rubs  Abdomen:  Soft, mild distension, BS present  Extremities: Trace edema  Neurologic: Awake, but not following commands  Skin: No acute rashes  Access: Rt IJ PC       Basic Metabolic Panel:   Recent Labs Lab 04/16/15 0550 04/17/15 0643 04/20/15 0502 04/22/15 0630  NA 137 139 136 134*  K 3.7 3.8 4.4 3.9  CL 99* 101 93* 94*  CO2 24 22 25 26   GLUCOSE 151* 105* 121* 85  BUN 58* 91* 107* 84*  CREATININE 5.17* 7.47* 9.96* 9.05*  CALCIUM 8.6* 8.8* 8.4* 8.8*  PHOS  --  5.8* 7.1* 6.0*     CBC:  Recent Labs Lab 04/16/15 0550 04/17/15 0643 04/20/15 0502 04/22/15 0630  WBC 6.0 11.1* 11.1* 7.6  HGB 8.3* 8.3* 7.7* 8.2*  HCT 25.9* 25.6* 23.9* 26.0*  MCV 89.3 89.8 89.5 92.2  PLT 86* 125* 161 203      Microbiology:  Recent Results (from the past 720 hour(s))  Blood culture (routine x 2)     Status: None   Collection Time: 03/24/15 12:52 PM  Result Value Ref Range Status   Specimen Description BLOOD LEFT ANTECUBITAL  Final   Special Requests IN PEDIATRIC BOTTLE 1CCS  Final   Culture NO GROWTH 5 DAYS  Final   Report Status 03/30/2015 FINAL  Final  Blood culture (routine x 2)     Status: None   Collection Time: 03/24/15  1:21 PM  Result Value Ref Range Status   Specimen Description BLOOD RIGHT HAND  Final   Special Requests BOTTLES DRAWN AEROBIC AND ANAEROBIC 5CC  Final   Culture NO GROWTH 5 DAYS  Final   Report Status 03/29/2015 FINAL  Final  MRSA PCR Screening     Status: None   Collection Time: 03/24/15  5:20 PM  Result Value Ref Range Status   MRSA by PCR  NEGATIVE NEGATIVE Final    Comment:        The GeneXpert MRSA Assay (FDA approved for NASAL specimens only), is one component of a comprehensive MRSA colonization surveillance program. It is not intended to diagnose MRSA infection nor to guide or monitor treatment for MRSA infections.   Culture, blood (routine x 2)     Status: None   Collection Time: 03/29/15 12:05 PM  Result Value Ref Range Status   Specimen Description BLOOD RIGHT ANTECUBITAL  Final   Special Requests BOTTLES DRAWN AEROBIC AND ANAEROBIC  Final   Culture NO GROWTH 5 DAYS  Final   Report Status 04/03/2015 FINAL  Final  Culture, blood (routine x 2)     Status: None   Collection Time: 03/29/15 12:42 PM  Result Value Ref Range Status   Specimen Description BLOOD RIGHT ANTECUBITAL  Final   Special Requests BOTTLES DRAWN AEROBIC AND ANAEROBIC 3CCS  Final   Culture NO GROWTH 5 DAYS  Final   Report Status 04/03/2015 FINAL  Final  MRSA PCR Screening     Status: None   Collection Time: 03/29/15  5:48 PM  Result Value Ref Range Status   MRSA  by PCR NEGATIVE NEGATIVE Final    Comment:        The GeneXpert MRSA Assay (FDA approved for NASAL specimens only), is one component of a comprehensive MRSA colonization surveillance program. It is not intended to diagnose MRSA infection nor to guide or monitor treatment for MRSA infections.   Respiratory virus panel     Status: None   Collection Time: 04/01/15  4:22 PM  Result Value Ref Range Status   Respiratory Syncytial Virus A Negative Negative Final   Respiratory Syncytial Virus B Negative Negative Final   Influenza A Negative Negative Final   Influenza B Negative Negative Final   Parainfluenza 1 Negative Negative Final   Parainfluenza 2 Negative Negative Final   Parainfluenza 3 Negative Negative Final   Metapneumovirus Negative Negative Final   Rhinovirus Negative Negative Final   Adenovirus Negative Negative Final    Comment: (NOTE) Performed At: [facility] [address] [place], [place] [id number] Mila Homer MD JY:[id number]   Culture, respiratory (tracheal aspirate)     Status: None   Collection Time: 04/02/15 12:15 AM  Result Value Ref Range Status   Specimen Description TRACHEAL ASPIRATE  Final   Special Requests Normal  Final   Gram Stain   Final    RARE WBC PRESENT, PREDOMINANTLY MONONUCLEAR NO SQUAMOUS EPITHELIAL CELLS SEEN NO ORGANISMS SEEN Performed at [facility]    Culture   Final    NORMAL OROPHARYNGEAL FLORA Performed at [facility]    Report Status 04/05/2015 FINAL  Final    Coagulation Studies: No results for input(s): LABPROT, INR in the last 72 hours.  Urinalysis: No results for input(s): COLORURINE, LABSPEC, PHURINE, GLUCOSEU, HGBUR, BILIRUBINUR, KETONESUR, PROTEINUR, UROBILINOGEN, NITRITE, LEUKOCYTESUR in the last 72 hours.  Invalid input(s): APPERANCEUR    Imaging: No results found.   Medications:       Assessment/ Plan:  55 y.o. male  with a PMHX of ESRD, hypertension, congestive heart failure, was admitted on 04/15/2015  [facility]  1. ESRD 2. Acute respiratory failure- Currently ventilator dependent 3. Anemia of chronic kidney disease- current hemoglobin down to 7.7 4. Secondary hyperparathyroidism 5. Thrombocytopenia, use citrate in catheter. 6. Hypokalemia  Plan: Patient seen post hemodialysis. He tolerated this well. Ultrafiltration achieved with 2.5 kg. Hemoglobin currently up to 8.2. We will continue to monitor this closely. We'll plan for his next also treatment on Friday. He remains on the ventilator at this time with FiO2 45%. We will continue ultrafiltration in an effort to 8 and his weaning from the ventilator.    LOS:  Kalyb Pemble 2/1/20173:43 PM

## 2015-04-22 NOTE — Progress Notes (Signed)
Referring Physician(s): Hijazi  Chief Complaint:  Dysphagia Long term care  Subjective:  Pt was scheduled for percutaneous gastric tube 04/10/15 Unfortunately, became agitated and combative and felt not safe to move forward with percutaneous procedure at that time Has since been placed in [facility] and more calm Still dysphagia; malnutrition Long term care  Rescheduled now for percutaneous gastric tube placement 2/2 in IR Afeb; wbc wnl Consent in chart  Allergies: Renvela  Medications: Prior to Admission medications   Medication Sig Start Date End Date Taking? Authorizing Provider  albuterol (PROVENTIL) (2.5 MG/3ML) 0.083% nebulizer solution Take 3 mLs (2.5 mg total) by nebulization every 3 (three) hours as needed for wheezing. 04/15/15   Otis Brace, MD  Amino Acids-Protein Hydrolys (FEEDING SUPPLEMENT, PRO-STAT SUGAR FREE 64,) LIQD Place 60 mLs into feeding tube 5 (five) times daily. 04/15/15   Otis Brace, MD  chlorhexidine gluconate (PERIDEX) 0.12 % solution 15 mLs by Mouth Rinse route 2 (two) times daily. 04/15/15   Otis Brace, MD  Darbepoetin Alfa (ARANESP) 100 MCG/0.5ML SOSY injection Inject 0.5 mLs (100 mcg total) into the vein every Wednesday with hemodialysis. 04/22/15   Otis Brace, MD  dexmedetomidine (PRECEDEX) 400 MCG/100ML SOLN Inject 53.6-160.8 mcg/hr into the vein continuous. 04/15/15   Otis Brace, MD  doxercalciferol (HECTOROL) 4 MCG/2ML injection Inject 1.5 mLs (3 mcg total) into the vein Every Tuesday,Thursday,and Saturday with dialysis. 04/15/15   Otis Brace, MD  fentaNYL (DURAGESIC - DOSED MCG/HR) 75 MCG/HR Place 1 patch (75 mcg total) onto the skin every 3 (three) days. 04/17/15   Otis Brace, MD  fentaNYL 2,500 mcg in sodium chloride 0.9 % 200 mL Inject 10 mcg/hr into the vein continuous. 04/15/15   Otis Brace, MD  hydrocortisone sodium succinate (SOLU-CORTEF) 100 MG SOLR injection Inject 1 mL (50 mg total) into the vein  every 12 (twelve) hours. 04/15/15   Otis Brace, MD  hydrocortisone sodium succinate (SOLU-CORTEF) 100 MG SOLR injection Inject 1 mL (50 mg total) into the vein daily. 04/18/15   Otis Brace, MD  LORazepam (ATIVAN) 2 MG/ML injection Inject 1 mL (2 mg total) into the vein every 6 (six) hours. 04/15/15   Otis Brace, MD  midazolam (VERSED) 2 MG/2ML SOLN injection Inject 1 mL (1 mg total) into the vein every 2 (two) hours as needed for agitation. 04/15/15   Otis Brace, MD  midodrine (PROAMATINE) 10 MG tablet Take 1 tablet (10 mg total) by mouth 3 (three) times daily before meals. 04/15/15   Otis Brace, MD  multivitamin (RENA-VIT) TABS tablet Take 1 tablet by mouth at bedtime. 05/21/14   [name], DO  Nutritional Supplements (FEEDING SUPPLEMENT, NEPRO CARB STEADY,) LIQD Place 1,000 mLs into feeding tube daily. 04/15/15   Otis Brace, MD  pantoprazole sodium (PROTONIX) 40 mg/20 mL PACK Place 20 mLs (40 mg total) into feeding tube at bedtime. 04/15/15   Otis Brace, MD  QUEtiapine (SEROQUEL) 200 MG tablet Place 1 tablet (200 mg total) into feeding tube daily. 04/15/15   Otis Brace, MD  sennosides (SENOKOT) 8.8 MG/5ML syrup Take 5 mLs by mouth 2 (two) times daily. 04/15/15   Otis Brace, MD  sodium chloride 0.9 % infusion Inject 100 mLs into the vein as needed (symptomatic hypotension or decrease in SBP < 90 mmHg). 04/15/15   Otis Brace, MD  sodium chloride 0.9 % infusion Inject 100 mLs into the vein as needed (severe cramping). 04/15/15   Otis Brace, MD  sodium chloride 0.9 % infusion  Inject 10 mLs into the vein continuous. 04/15/15   Otis Brace, MD  sodium chloride 0.9 % injection 10-40 mLs by Intracatheter route as needed (flush). 04/15/15   Otis Brace, MD  valproate 500 mg in dextrose 5 % 50 mL Inject 500 mg into the vein 3 (three) times daily. 04/15/15   Otis Brace, MD     Vital Signs: There were no vitals taken for this visit.  Physical Exam    Cardiovascular: Normal rate and regular rhythm.   Pulmonary/Chest:  Trach/vent  Neurological:  No response  Skin: Skin is warm.  Nursing note and vitals reviewed.   Imaging: No results found.  Labs:  CBC:  Recent Labs  04/16/15 0550 04/17/15 0643 04/20/15 0502 04/22/15 0630  WBC 6.0 11.1* 11.1* 7.6  HGB 8.3* 8.3* 7.7* 8.2*  HCT 25.9* 25.6* 23.9* 26.0*  PLT 86* 125* 161 203    COAGS:  Recent Labs  05/21/14 0620  03/29/15 1745 04/03/15 0440 04/04/15 0401 04/05/15 0426 04/13/15 0500 04/15/15 0550  INR 1.11  --  1.23  --   --   --  1.42 2.03*  APTT  --   < > 26 42* 35 140* 32  --   < > = values in this interval not displayed.  BMP:  Recent Labs  04/16/15 0550 04/17/15 0643 04/20/15 0502 04/22/15 0630  NA 137 139 136 134*  K 3.7 3.8 4.4 3.9  CL 99* 101 93* 94*  CO2 GLUCOSE 151* 105* 121* 85  BUN 58* 91* 107* 84*  CALCIUM 8.6* 8.8* 8.4* 8.8*  CREATININE 5.17* 7.47* 9.96* 9.05*  GFRNONAA 11* 7* 5* 6*  GFRAA 13* 8* 6* 7*    LIVER FUNCTION TESTS:  Recent Labs  02/24/15 1409  03/29/15 1205  04/14/15 0515  04/15/15 0550 04/17/15 0643 04/20/15 0502 04/22/15 0630  BILITOT 0.6  --  0.7  --  0.6  --  0.9  --   --   --   AST 17  --  22  --  41  --  33  --   --   --   ALT 15*  --  15*  --  26  --  17  --   --   --   ALKPHOS 61  --  52  --  69  --  61  --   --   --   PROT 6.7  --  7.5  --  5.8*  --  5.8*  --   --   --   ALBUMIN 3.2*  < > 4.0  < > 2.4*  < > 2.3* 2.8* 2.4* 2.7*  < > = values in this interval not displayed.  Assessment and Plan:  Dysphagia Trach/vent---need for long term care Malnutrition Rescheduled for percutaneous gastric tube placement 2/2 in IR Consent in chart  Electronically Signed: Daviona Herbert A 04/22/2015, 1:44 PM   I spent a total of 15 Minutes at the the patient's bedside AND on the patient's hospital floor or unit, greater than 50% of which was counseling/coordinating care for percutaneous gastric  tube placement

## 2015-04-23 ENCOUNTER — Other Ambulatory Visit (HOSPITAL_COMMUNITY): Payer: Self-pay

## 2015-04-23 LAB — CBC
HEMATOCRIT: 26.2 % — AB (ref 39.0–52.0)
HEMOGLOBIN: 8.1 g/dL — AB (ref 13.0–17.0)
MCH: 28.7 pg (ref 26.0–34.0)
MCHC: 30.9 g/dL (ref 30.0–36.0)
MCV: 92.9 fL (ref 78.0–100.0)
Platelets: 218 10*3/uL (ref 150–400)
RBC: 2.82 MIL/uL — AB (ref 4.22–5.81)
RDW: 18.7 % — ABNORMAL HIGH (ref 11.5–15.5)
WBC: 7.8 10*3/uL (ref 4.0–10.5)

## 2015-04-23 LAB — PROTIME-INR
INR: 1.16 (ref 0.00–1.49)
Prothrombin Time: 15 seconds (ref 11.6–15.2)

## 2015-04-23 LAB — APTT: APTT: 28 s (ref 24–37)

## 2015-04-23 MED ORDER — FENTANYL CITRATE (PF) 100 MCG/2ML IJ SOLN
INTRAMUSCULAR | Status: AC | PRN
  Administered 2015-04-23 (×2): 50 ug via INTRAVENOUS

## 2015-04-23 MED ORDER — GLUCAGON HCL RDNA (DIAGNOSTIC) 1 MG IJ SOLR
INTRAMUSCULAR | Status: AC
  Filled 2015-04-23: qty 1

## 2015-04-23 MED ORDER — FENTANYL CITRATE (PF) 100 MCG/2ML IJ SOLN
INTRAMUSCULAR | Status: AC
  Filled 2015-04-23: qty 2

## 2015-04-23 MED ORDER — MIDAZOLAM HCL 2 MG/2ML IJ SOLN
INTRAMUSCULAR | Status: AC
  Filled 2015-04-23: qty 2

## 2015-04-23 MED ORDER — LIDOCAINE HCL 1 % IJ SOLN
INTRAMUSCULAR | Status: AC
  Filled 2015-04-23: qty 20

## 2015-04-23 MED ORDER — MIDAZOLAM HCL 2 MG/2ML IJ SOLN
INTRAMUSCULAR | Status: AC | PRN
  Administered 2015-04-23 (×3): 1 mg via INTRAVENOUS

## 2015-04-23 MED ORDER — IOHEXOL 300 MG/ML  SOLN
50.0000 mL | Freq: Once | INTRAMUSCULAR | Status: AC | PRN
  Administered 2015-04-23: 10 mL via INTRAVENOUS

## 2015-04-23 MED ORDER — MIDAZOLAM HCL 2 MG/2ML IJ SOLN
INTRAMUSCULAR | Status: AC
Start: 2015-04-23 — End: 2015-04-23
  Filled 2015-04-23: qty 2

## 2015-04-23 NOTE — Sedation Documentation (Signed)
Unable to verbalize pain verbally, observing behavior

## 2015-04-23 NOTE — Sedation Documentation (Signed)
Patient is resting comfortably. Not agitated

## 2015-04-23 NOTE — Sedation Documentation (Signed)
Ancef 3GM IV infusing per order

## 2015-04-23 NOTE — Procedures (Signed)
Interventional Radiology Procedure Note  Procedure:  Percutaneous gastrostomy tube placement  Complications:  None  Estimated Blood Loss: < 10 mL  20 Fr bumper retention gastrostomy tube placed into body of stomach.  OK to use in 24 hours.  Jodi Marble. Fredia Sorrow, M.D Pager:  [phone]

## 2015-04-23 NOTE — Progress Notes (Signed)
Name: Gary Rivera MRN: [id number] DOB: December 30, 1960    ADMISSION DATE:  04/15/2015 CONSULTATION DATE:  04/16/15  REFERRING MD :  Dr. Sharyon Medicus   CHIEF COMPLAINT:  Tracheostomy dependent respiratory failure    HISTORY OF PRESENT ILLNESS:  55 y/o M with PMH of medical non-compliance (the patient missed HD prior to admit) with frequent admissions for hypercarbic respiratory failure, ESRD on HD (dry weight ~ 138 kg), CHF, anemia, anxiety, HTN, and chronic respiratory failure and recent admission to [facility] from 1/8-1/25/17 for acute on chronic hypoxic / hypercapnic respiratory failure, pulmonary edema s/p tracheostomy.  Hospital course complicated by agitation / psychosis, hypotension requiring vasopressors / stress dose steroids (ultimately was placed on midodrine), acidosis, inability to wean from mechanical ventilation and trach was placed and a left anti-cubital wound infection.  He was transferred to [facility] for further weaning on 1/25.    PCCM consulted for evaluation and assistance with ventilator weaning.      SUBJECTIVE: No major issues. Continues on PSV wean.   VITAL SIGNS:   Vital signs reviewed. Abnormal values will appear under impression plan section.   PHYSICAL EXAMINATION: General:  Obese adult male, No distress. agitated and in restaints Neuro:  Agitated, No gross focal deficits.  HEENT:  Moist mucus membranes, No thyromegaly, JVD Cardiovascular:  S, S2, RRR, No MRG Lungs:  Clear, no wheeze or crackles. Abdomen:  Obese, soft, +BS Musculoskeletal:  No acute deformities  Skin:  No edema   Recent Labs Lab 04/17/15 0643 04/20/15 0502 04/22/15 0630  NA 139 136 134*  K 3.8 4.4 3.9  CL 101 93* 94*  CO2 22 25 26   BUN 91* 107* 84*  CREATININE 7.47* 9.96* 9.05*  GLUCOSE 105* 121* 85    Recent Labs Lab 04/20/15 0502 04/22/15 0630 04/23/15 0530  HGB 7.7* 8.2* 8.1*  HCT 23.9* 26.0* 26.2*  WBC 11.1* 7.6 7.8  PLT 161 203 218   No results found.  SIGNIFICANT EVENTS    1/8-1/25  Admit to [facility] for acute on chronic hypoxemic / hypercapnic respiratory failure, s/p trach 1/25  Tx to [facility] for vent weaning  1/26  PCCM consulted for evaluation   STUDIES:    ASSESSMENT / PLAN:  Acute on Chronic Hypoxemic / Hypercarbic Respiratory Failure - secondary to pulmonary edema, medical non-compliance, CHF, suspected OSA ?OHS Pulmonary Edema  Suspected OSA / OHS  CHF - last ECHO 03/26/15 with EF 35-40%, diffuse hypokinesis, grade 1 diastolic dysfunction, PA Peak pressure 41 PAH - noted on ECHO above Medical Non-compliance   Plan: MV support, 8 cc/kg, Agitation remains an issue. Consider increasing po sedation. Continue PSV weans. Tolerated 12/5 for 2 hrs today Negative balance as tolerated  Trach care per protocol  Intermittent CXR  Push PT efforts, mobilize patient as able  Placement will be a difficult issue for Mr. Chukwu with ESRD and tracheostomy.  Will work toward ultimate decannulation if possible but in the past he has been non-compliant with therapies.    Other Medical Issues per [facility]  Thrombocytopenia  Anemia  Coagulopathy  Chronic Hypotension  ESRD on HD  Protein Calorie Malnutrition   [name] Minor ACNP Adolph Pollack PCCM Pager [phone] till 3 pm If no answer page [phone] 04/23/2015, 8:45 AM  Attending note: I have seen and examined the patient with nurse practitioner/resident and agree with the note. History, labs and imaging reviewed.  S/p PEG placement. Somnolent after procedure. Weaning on PSV.  Chilton Greathouse MD [facility] Pager  [phone] If no answer or after 3pm call: [phone] 04/23/2015, 2:00 PM

## 2015-04-23 NOTE — Sedation Documentation (Signed)
Patient is resting comfortably. 

## 2015-04-24 LAB — RENAL FUNCTION PANEL
ALBUMIN: 2.2 g/dL — AB (ref 3.5–5.0)
Anion gap: 17 — ABNORMAL HIGH (ref 5–15)
BUN: 82 mg/dL — AB (ref 6–20)
CALCIUM: 8.7 mg/dL — AB (ref 8.9–10.3)
CO2: 26 mmol/L (ref 22–32)
Chloride: 91 mmol/L — ABNORMAL LOW (ref 101–111)
Creatinine, Ser: 9.85 mg/dL — ABNORMAL HIGH (ref 0.61–1.24)
GFR calc Af Amer: 6 mL/min — ABNORMAL LOW (ref >60)
GFR, EST NON AFRICAN AMERICAN: 5 mL/min — AB (ref >60)
GLUCOSE: 91 mg/dL (ref 65–99)
PHOSPHORUS: 5.1 mg/dL — AB (ref 2.5–4.6)
POTASSIUM: 4.1 mmol/L (ref 3.5–5.1)
Sodium: 134 mmol/L — ABNORMAL LOW (ref 135–145)

## 2015-04-24 LAB — CBC
HEMATOCRIT: 25.2 % — AB (ref 39.0–52.0)
Hemoglobin: 7.9 g/dL — ABNORMAL LOW (ref 13.0–17.0)
MCH: 28.8 pg (ref 26.0–34.0)
MCHC: 31.3 g/dL (ref 30.0–36.0)
MCV: 92 fL (ref 78.0–100.0)
Platelets: 175 10*3/uL (ref 150–400)
RBC: 2.74 MIL/uL — ABNORMAL LOW (ref 4.22–5.81)
RDW: 19.4 % — AB (ref 11.5–15.5)
WBC: 8.2 10*3/uL (ref 4.0–10.5)

## 2015-04-24 LAB — CULTURE, RESPIRATORY W GRAM STAIN

## 2015-04-24 LAB — CULTURE, RESPIRATORY

## 2015-04-24 NOTE — Progress Notes (Signed)
Subjective:  Patient seen at bedside exam. Temperature high at 100.4. Still remains on the ventilator with FiO2 of 45 and PEEP of 8. Slightly agitated this a.m. As well.     Objective:  Vital signs in last 24 hours:  Temperature 100.4 pulse 1096 respirations 20 blood pressure 148/56  Physical Exam: General: Laying in bed, chronically ill appearing  HEENT NGT  Neck Trach in place, secretions noted around the trach.  Pulm/lungs Vent assisted, coarse breath sounds, FiO2 45%  CVS/Heart S1S2 no rubs  Abdomen:  Soft, mild distension, BS present  Extremities: Trace edema  Neurologic: Awake, but not following commands, slightly agitated  Skin: No acute rashes  Access: Rt IJ PC       Basic Metabolic Panel:   Recent Labs Lab 04/20/15 0502 04/22/15 0630 04/24/15 0635  NA 136 134* 134*  K 4.4 3.9 4.1  CL 93* 94* 91*  CO2 '25 26 26  ' GLUCOSE 121* 85 91  BUN 107* 84* 82*  CREATININE 9.96* 9.05* 9.85*  CALCIUM 8.4* 8.8* 8.7*  PHOS 7.1* 6.0* 5.1*     CBC:  Recent Labs Lab 04/20/15 0502 04/22/15 0630 04/23/15 0530 04/24/15 0818  WBC 11.1* 7.6 7.8 8.2  HGB 7.7* 8.2* 8.1* 7.9*  HCT 23.9* 26.0* 26.2* 25.2*  MCV 89.5 92.2 92.9 92.0  PLT 161 203 218 175      Microbiology:  Recent Results (from the past 720 hour(s))  Culture, blood (routine x 2)     Status: None   Collection Time: 03/29/15 12:05 PM  Result Value Ref Range Status   Specimen Description BLOOD RIGHT ANTECUBITAL  Final   Special Requests BOTTLES DRAWN AEROBIC AND ANAEROBIC 5MLS  Final   Culture NO GROWTH 5 DAYS  Final   Report Status 04/03/2015 FINAL  Final  Culture, blood (routine x 2)     Status: None   Collection Time: 03/29/15 12:42 PM  Result Value Ref Range Status   Specimen Description BLOOD RIGHT ANTECUBITAL  Final   Special Requests BOTTLES DRAWN AEROBIC AND ANAEROBIC 3CCS  Final   Culture NO GROWTH 5 DAYS  Final   Report Status 04/03/2015 FINAL  Final  MRSA PCR Screening     Status: None    Collection Time: 03/29/15  5:48 PM  Result Value Ref Range Status   MRSA by PCR NEGATIVE NEGATIVE Final    Comment:        The GeneXpert MRSA Assay (FDA approved for NASAL specimens only), is one component of a comprehensive MRSA colonization surveillance program. It is not intended to diagnose MRSA infection nor to guide or monitor treatment for MRSA infections.   Respiratory virus panel     Status: None   Collection Time: 04/01/15  4:22 PM  Result Value Ref Range Status   Respiratory Syncytial Virus A Negative Negative Final   Respiratory Syncytial Virus B Negative Negative Final   Influenza A Negative Negative Final   Influenza B Negative Negative Final   Parainfluenza 1 Negative Negative Final   Parainfluenza 2 Negative Negative Final   Parainfluenza 3 Negative Negative Final   Metapneumovirus Negative Negative Final   Rhinovirus Negative Negative Final   Adenovirus Negative Negative Final    Comment: (NOTE) Performed At: [facility] [address] [place], [place] [id number] Lindon Romp MD FH:[id number]   Culture, respiratory (tracheal aspirate)     Status: None   Collection Time: 04/02/15 12:15 AM  Result Value Ref Range Status   Specimen Description TRACHEAL  ASPIRATE  Final   Special Requests Normal  Final   Gram Stain   Final    RARE WBC PRESENT, PREDOMINANTLY MONONUCLEAR NO SQUAMOUS EPITHELIAL CELLS SEEN NO ORGANISMS SEEN Performed at [facility]    Culture   Final    NORMAL OROPHARYNGEAL FLORA Performed at [facility]    Report Status 04/05/2015 FINAL  Final  Culture, respiratory (NON-Expectorated)     Status: None (Preliminary result)   Collection Time: 04/22/15  3:20 PM  Result Value Ref Range Status   Specimen Description TRACHEAL ASPIRATE  Final   Special Requests NONE  Final   Gram Stain   Final    ABUNDANT WBC PRESENT,BOTH PMN AND MONONUCLEAR RARE SQUAMOUS EPITHELIAL CELLS PRESENT FEW GRAM POSITIVE  RODS Performed at [facility]    Culture NO GROWTH Performed at [facility]   Final   Report Status PENDING  Incomplete    Coagulation Studies:  Recent Labs  04/23/15 0530  LABPROT 15.0  INR 1.16    Urinalysis: No results for input(s): COLORURINE, LABSPEC, PHURINE, GLUCOSEU, HGBUR, BILIRUBINUR, KETONESUR, PROTEINUR, UROBILINOGEN, NITRITE, LEUKOCYTESUR in the last 72 hours.  Invalid input(s): APPERANCEUR    Imaging: Ir Gastrostomy Tube Mod Sed  04/23/2015  CLINICAL DATA:  Respiratory failure and need for gastrostomy tube for long-term nutrition. EXAM: PERCUTANEOUS GASTROSTOMY TUBE PLACEMENT ANESTHESIA/SEDATION: 3.0 mg IV Versed; 100 mcg IV Fentanyl. Total Moderate Sedation Time 15 minutes. The patient's level of consciousness and physiologic status were continuously monitored during the procedure by Radiology nursing. CONTRAST:  62m OMNIPAQUE IOHEXOL 300 MG/ML  SOLN MEDICATIONS: 2 g IV Ancef. As antibiotic prophylaxis, Ancef was ordered pre-procedure and administered intravenously within one hour of incision. FLUOROSCOPY TIME:  2 minutes and 54 seconds. PROCEDURE: The procedure, risks, benefits, and alternatives were explained to the patient's sister. Questions regarding the procedure were encouraged and answered. The patient's sister understands and consents to the procedure. A time-out was performed prior to the procedure. A 5-French catheter was then advanced through the the patient's mouth under fluoroscopy into the esophagus and to the level of the stomach. This catheter was used to insufflate the stomach with air under fluoroscopy. The abdominal wall was prepped with Betadine in a sterile fashion, and a sterile drape was applied covering the operative field. A sterile gown and sterile gloves were used for the procedure. Local anesthesia was provided with 1% Lidocaine. A skin incision was made in the upper abdominal wall. Under fluoroscopy, an 18 gauge trocar needle  was advanced into the stomach. Contrast injection was performed to confirm intraluminal position of the needle tip. A single T tack was then deployed in the lumen of the stomach. This was brought up to tension at the skin surface. Over a guidewire, a 9-French sheath was advanced into the lumen of the stomach. The wire was left in place as a safety wire. A loop snare device from a percutaneous gastrostomy kit was then advanced into the stomach. A floppy guide wire was advanced through the orogastric catheter under fluoroscopy in the stomach. The loop snare advanced through the percutaneous gastric access was used to snare the guide wire. This allowed withdrawal of the loop snare out of the patient's mouth by retraction of the orogastric catheter and wire. A 20-French bumper retention gastrostomy tube was looped around the snare device. It was then pulled back through the patient's mouth. The retention bumper was brought up to the anterior gastric wall. The T tack suture was cut  at the skin. The exiting gastrostomy tube was cut to appropriate length and a feeding adapter applied. The catheter was injected with contrast material to confirm position and a fluoroscopic spot image saved. The tube was then flushed with saline. A dressing was applied over the gastrostomy exit site. COMPLICATIONS: None. FINDINGS: The stomach distended well with air allowing safe placement of the gastrostomy tube. After placement, the tip of the gastrostomy tube lies in the body of the stomach. IMPRESSION: Percutaneous gastrostomy with placement of a 20-French bumper retention tube in the body of the stomach. This tube can be used for percutaneous feeds beginning in 24 hours after placement. Electronically Signed   By: Aletta Edouard M.D.   On: 04/23/2015 11:52     Medications:       Assessment/ Plan:  55 y.o. male  with a PMHX of ESRD, hypertension, congestive heart failure, was admitted on 04/15/2015  [facility]  1. ESRD 2. Acute respiratory failure- Currently ventilator dependent 3. Anemia of chronic kidney disease- current hemoglobin down to 7.7 4. Secondary hyperparathyroidism 5. Thrombocytopenia, use citrate in catheter. 6. Hypokalemia  Plan: Patient seen at bedside this a.m. Slightly febrile. Recommend obtaining blood cultures x2 sets this a.m. Dialysis schedule for today and orders have been prepared. We will continue dialysis on a Monday, Wednesday, Friday schedule. Patient remains on a ventilator and has an FiO2 of 45% with a PEEP of 5. Continue to pack catheter with citrate. Remains overall quite ill. Guarded prognosis.  LOS:  Tyneshia Stivers 2/3/20178:33 AM

## 2015-04-26 LAB — CBC
HCT: 20 % — ABNORMAL LOW (ref 39.0–52.0)
HEMOGLOBIN: 6.5 g/dL — AB (ref 13.0–17.0)
MCH: 29.5 pg (ref 26.0–34.0)
MCHC: 32.5 g/dL (ref 30.0–36.0)
MCV: 90.9 fL (ref 78.0–100.0)
Platelets: 189 10*3/uL (ref 150–400)
RBC: 2.2 MIL/uL — AB (ref 4.22–5.81)
RDW: 20.6 % — ABNORMAL HIGH (ref 11.5–15.5)
WBC: 5.9 10*3/uL (ref 4.0–10.5)

## 2015-04-26 LAB — BASIC METABOLIC PANEL
ANION GAP: 15 (ref 5–15)
BUN: 74 mg/dL — AB (ref 6–20)
CHLORIDE: 92 mmol/L — AB (ref 101–111)
CO2: 27 mmol/L (ref 22–32)
Calcium: 6.9 mg/dL — ABNORMAL LOW (ref 8.9–10.3)
Creatinine, Ser: 9.09 mg/dL — ABNORMAL HIGH (ref 0.61–1.24)
GFR calc Af Amer: 7 mL/min — ABNORMAL LOW (ref >60)
GFR, EST NON AFRICAN AMERICAN: 6 mL/min — AB (ref >60)
GLUCOSE: 85 mg/dL (ref 65–99)
POTASSIUM: 5.6 mmol/L — AB (ref 3.5–5.1)
SODIUM: 134 mmol/L — AB (ref 135–145)

## 2015-04-26 LAB — HEMOGLOBIN AND HEMATOCRIT, BLOOD
HCT: 25.6 % — ABNORMAL LOW (ref 39.0–52.0)
HEMOGLOBIN: 8 g/dL — AB (ref 13.0–17.0)

## 2015-04-27 LAB — RENAL FUNCTION PANEL
ALBUMIN: 2.8 g/dL — AB (ref 3.5–5.0)
ANION GAP: 16 — AB (ref 5–15)
BUN: 88 mg/dL — ABNORMAL HIGH (ref 6–20)
CALCIUM: 8.6 mg/dL — AB (ref 8.9–10.3)
CO2: 27 mmol/L (ref 22–32)
Chloride: 91 mmol/L — ABNORMAL LOW (ref 101–111)
Creatinine, Ser: 10.81 mg/dL — ABNORMAL HIGH (ref 0.61–1.24)
GFR calc non Af Amer: 5 mL/min — ABNORMAL LOW (ref >60)
GFR, EST AFRICAN AMERICAN: 5 mL/min — AB (ref >60)
Glucose, Bld: 97 mg/dL (ref 65–99)
PHOSPHORUS: 5.5 mg/dL — AB (ref 2.5–4.6)
Potassium: 4 mmol/L (ref 3.5–5.1)
SODIUM: 134 mmol/L — AB (ref 135–145)

## 2015-04-27 LAB — AMMONIA: Ammonia: 11 umol/L (ref 9–35)

## 2015-04-27 LAB — CBC
HCT: 24.7 % — ABNORMAL LOW (ref 39.0–52.0)
HEMOGLOBIN: 7.6 g/dL — AB (ref 13.0–17.0)
MCH: 28.5 pg (ref 26.0–34.0)
MCHC: 30.8 g/dL (ref 30.0–36.0)
MCV: 92.5 fL (ref 78.0–100.0)
Platelets: 228 10*3/uL (ref 150–400)
RBC: 2.67 MIL/uL — AB (ref 4.22–5.81)
RDW: 19.2 % — ABNORMAL HIGH (ref 11.5–15.5)
WBC: 6.5 10*3/uL (ref 4.0–10.5)

## 2015-04-27 NOTE — Progress Notes (Signed)
Subjective:  Patient seen at bedside during HD Remains delirious. Requiring restraints Still remains on the ventilator with FiO2 of 45 and PEEP of 8. Slightly agitated today as well.     Objective:  Vital signs in last 24 hours:  Temperature 97.6    blood pressure 151/756  Physical Exam: General: Laying in bed, chronically ill appearing  HEENT NGT  Neck Trach in place, secretions noted around the trach.  Pulm/lungs Vent assisted, coarse breath sounds, FiO2 45%  CVS/Heart S1S2 no rubs  Abdomen:  Soft, mild distension, BS present  Extremities: + dependent edema in upper arms  Neurologic: Awake, but not following commands, slightly agitated  Skin: No acute rashes  Access: Rt IJ PC       Basic Metabolic Panel:   Recent Labs Lab 04/22/15 0630 04/24/15 0635 04/26/15 0535 04/27/15 0625  NA 134* 134* 134* 134*  K 3.9 4.1 5.6* 4.0  CL 94* 91* 92* 91*  CO2 GLUCOSE 85 91 85 97  BUN 84* 82* 74* 88*  CREATININE 9.05* 9.85* 9.09* 10.81*  CALCIUM 8.8* 8.7* 6.9* 8.6*  PHOS 6.0* 5.1*  --  5.5*     CBC:  Recent Labs Lab 04/22/15 0630 04/23/15 0530 04/24/15 0818 04/26/15 0535 04/26/15 0639 04/27/15 0625  WBC 7.6 7.8 8.2 5.9  --  6.5  HGB 8.2* 8.1* 7.9* 6.5* 8.0* 7.6*  HCT 26.0* 26.2* 25.2* 20.0* 25.6* 24.7*  MCV 92.2 92.9 92.0 90.9  --  92.5  PLT 203 218 175 189  --  228      Microbiology:  Recent Results (from the past 720 hour(s))  Culture, blood (routine x 2)     Status: None   Collection Time: 03/29/15 12:05 PM  Result Value Ref Range Status   Specimen Description BLOOD RIGHT ANTECUBITAL  Final   Special Requests BOTTLES DRAWN AEROBIC AND ANAEROBIC  Final   Culture NO GROWTH 5 DAYS  Final   Report Status 04/03/2015 FINAL  Final  Culture, blood (routine x 2)     Status: None   Collection Time: 03/29/15 12:42 PM  Result Value Ref Range Status   Specimen Description BLOOD RIGHT ANTECUBITAL  Final   Special Requests BOTTLES DRAWN  AEROBIC AND ANAEROBIC 3CCS  Final   Culture NO GROWTH 5 DAYS  Final   Report Status 04/03/2015 FINAL  Final  MRSA PCR Screening     Status: None   Collection Time: 03/29/15  5:48 PM  Result Value Ref Range Status   MRSA by PCR NEGATIVE NEGATIVE Final    Comment:        The GeneXpert MRSA Assay (FDA approved for NASAL specimens only), is one component of a comprehensive MRSA colonization surveillance program. It is not intended to diagnose MRSA infection nor to guide or monitor treatment for MRSA infections.   Respiratory virus panel     Status: None   Collection Time: 04/01/15  4:22 PM  Result Value Ref Range Status   Respiratory Syncytial Virus A Negative Negative Final   Respiratory Syncytial Virus B Negative Negative Final   Influenza A Negative Negative Final   Influenza B Negative Negative Final   Parainfluenza 1 Negative Negative Final   Parainfluenza 2 Negative Negative Final   Parainfluenza 3 Negative Negative Final   Metapneumovirus Negative Negative Final   Rhinovirus Negative Negative Final   Adenovirus Negative Negative Final    Comment: (NOTE) Performed At: [facility] [address] [place], [place]  [id number] Mila Homer MD MH:[id number]   Culture, respiratory (tracheal aspirate)     Status: None   Collection Time: 04/02/15 12:15 AM  Result Value Ref Range Status   Specimen Description TRACHEAL ASPIRATE  Final   Special Requests Normal  Final   Gram Stain   Final    RARE WBC PRESENT, PREDOMINANTLY MONONUCLEAR NO SQUAMOUS EPITHELIAL CELLS SEEN NO ORGANISMS SEEN Performed at [facility]    Culture   Final    NORMAL OROPHARYNGEAL FLORA Performed at [facility]    Report Status 04/05/2015 FINAL  Final  Culture, respiratory (NON-Expectorated)     Status: None   Collection Time: 04/22/15  3:20 PM  Result Value Ref Range Status   Specimen Description TRACHEAL ASPIRATE  Final   Special Requests NONE  Final   Gram Stain    Final    ABUNDANT WBC PRESENT,BOTH PMN AND MONONUCLEAR RARE SQUAMOUS EPITHELIAL CELLS PRESENT FEW GRAM POSITIVE RODS Performed at [facility]    Culture   Final    ABUNDANT DIPHTHEROIDS(CORYNEBACTERIUM SPECIES) Note: Standardized susceptibility testing for this organism is not available. Performed at [facility]    Report Status 04/24/2015 FINAL  Final  Culture, blood (routine x 2)     Status: None (Preliminary result)   Collection Time: 04/24/15  9:47 AM  Result Value Ref Range Status   Specimen Description BLOOD CENTRAL LINE  Final   Special Requests BOTTLES DRAWN AEROBIC AND ANAEROBIC 5CC  Final   Culture NO GROWTH 3 DAYS  Final   Report Status PENDING  Incomplete  Culture, blood (routine x 2)     Status: None (Preliminary result)   Collection Time: 04/24/15 11:20 AM  Result Value Ref Range Status   Specimen Description BLOOD RIGHT ANTECUBITAL  Final   Special Requests BOTTLES DRAWN AEROBIC AND ANAEROBIC 10CC  Final   Culture NO GROWTH 3 DAYS  Final   Report Status PENDING  Incomplete    Coagulation Studies: No results for input(s): LABPROT, INR in the last 72 hours.  Urinalysis: No results for input(s): COLORURINE, LABSPEC, PHURINE, GLUCOSEU, HGBUR, BILIRUBINUR, KETONESUR, PROTEINUR, UROBILINOGEN, NITRITE, LEUKOCYTESUR in the last 72 hours.  Invalid input(s): APPERANCEUR    Imaging: No results found.   Medications:       Assessment/ Plan:  55 y.o. male  with a PMHX of ESRD, hypertension, congestive heart failure, was admitted on 04/15/2015  [facility]  1. ESRD 2. Acute respiratory failure- Currently ventilator dependent 3. Anemia of chronic kidney disease- current hemoglobin down to 7.6 4. Secondary hyperparathyroidism 5. Thrombocytopenia, HIT POSITIVE- use citrate in catheter. 6. Hypokalemia  Plan: Patient seen during dialysis Tolerating well  We will continue dialysis on a  Monday, Wednesday, Friday schedule. Patient remains on a ventilator and has an FiO2 of 45% with a PEEP of 5. Continue to pack catheter with citrate. Remains overall quite ill. Guarded prognosis.  LOS:  Mosetta Pigeon 2/6/20174:25 PM

## 2015-04-28 DIAGNOSIS — R451 Restlessness and agitation: Secondary | ICD-10-CM

## 2015-04-28 DIAGNOSIS — R0902 Hypoxemia: Secondary | ICD-10-CM

## 2015-04-28 DIAGNOSIS — J9601 Acute respiratory failure with hypoxia: Secondary | ICD-10-CM

## 2015-04-28 DIAGNOSIS — Z930 Tracheostomy status: Secondary | ICD-10-CM

## 2015-04-28 LAB — BASIC METABOLIC PANEL
ANION GAP: 16 — AB (ref 5–15)
BUN: 45 mg/dL — ABNORMAL HIGH (ref 6–20)
CO2: 27 mmol/L (ref 22–32)
Calcium: 9.1 mg/dL (ref 8.9–10.3)
Chloride: 92 mmol/L — ABNORMAL LOW (ref 101–111)
Creatinine, Ser: 7.45 mg/dL — ABNORMAL HIGH (ref 0.61–1.24)
GFR calc Af Amer: 8 mL/min — ABNORMAL LOW (ref >60)
GFR, EST NON AFRICAN AMERICAN: 7 mL/min — AB (ref >60)
Glucose, Bld: 97 mg/dL (ref 65–99)
POTASSIUM: 3.5 mmol/L (ref 3.5–5.1)
SODIUM: 135 mmol/L (ref 135–145)

## 2015-04-28 LAB — CBC
HCT: 26.9 % — ABNORMAL LOW (ref 39.0–52.0)
Hemoglobin: 8.3 g/dL — ABNORMAL LOW (ref 13.0–17.0)
MCH: 28.9 pg (ref 26.0–34.0)
MCHC: 30.9 g/dL (ref 30.0–36.0)
MCV: 93.7 fL (ref 78.0–100.0)
PLATELETS: 179 10*3/uL (ref 150–400)
RBC: 2.87 MIL/uL — AB (ref 4.22–5.81)
RDW: 19.1 % — ABNORMAL HIGH (ref 11.5–15.5)
WBC: 5.5 10*3/uL (ref 4.0–10.5)

## 2015-04-28 NOTE — Progress Notes (Signed)
Name: Gary Rivera MRN: [id number] DOB: 10-Jul-1960    ADMISSION DATE:  04/15/2015 CONSULTATION DATE:  04/16/15  REFERRING MD :  Dr. Sharyon Medicus   CHIEF COMPLAINT:  Tracheostomy dependent respiratory failure    HISTORY OF PRESENT ILLNESS:  55 y/o M with PMH of medical non-compliance (the patient missed HD prior to admit) with frequent admissions for hypercarbic respiratory failure, ESRD on HD (dry weight ~ 138 kg), CHF, anemia, anxiety, HTN, and chronic respiratory failure and recent admission to [facility] from 1/8-1/25/17 for acute on chronic hypoxic / hypercapnic respiratory failure, pulmonary edema s/p tracheostomy.  Hospital course complicated by agitation / psychosis, hypotension requiring vasopressors / stress dose steroids (ultimately was placed on midodrine), acidosis, inability to wean from mechanical ventilation and trach was placed and a left anti-cubital wound infection.  He was transferred to [facility] for further weaning on 1/25.    PCCM consulted for evaluation and assistance with ventilator weaning.      SUBJECTIVE: PSV wean 14/7.Tolerating well, but RR up. Agitated so hard to know how much of tachypnea is due to this.   VITAL SIGNS:   97.38F, 102, 30, 154/89, 99% on 40%fio2  PHYSICAL EXAMINATION: General:  Obese adult male, No distress. agitated and in restaints Neuro:  Agitated, No gross focal deficits. Minimally follows commands. HEENT:  Moist mucus membranes, No JVD Cardiovascular:  RRR, No MRG Lungs:  Clear, no wheeze or crackles. Abdomen:  Obese, soft, +BS Musculoskeletal:  No acute deformities  Skin:  No edema   Recent Labs Lab 04/26/15 0535 04/27/15 0625 04/28/15 0545  NA 134* 134* 135  K 5.6* 4.0 3.5  CL 92* 91* 92*  CO2 BUN 74* 88* 45*  CREATININE 9.09* 10.81* 7.45*  GLUCOSE 85 97 97    Recent Labs Lab 04/26/15 0535 04/26/15 0639 04/27/15 0625 04/28/15 0545  HGB 6.5* 8.0* 7.6* 8.3*  HCT 20.0* 25.6* 24.7* 26.9*  WBC 5.9  --  6.5 5.5  PLT  189  --  228 179   No results found.  SIGNIFICANT EVENTS  1/8-1/25  Admit to [facility] for acute on chronic hypoxemic / hypercapnic respiratory failure, s/p trach 1/25  Tx to [facility] for vent weaning  1/26  PCCM consulted for evaluation   STUDIES:    ASSESSMENT / PLAN:  Acute on Chronic Hypoxemic / Hypercarbic Respiratory Failure - secondary to pulmonary edema, medical non-compliance, CHF, suspected OSA ?OHS Pulmonary Edema  Suspected OSA / OHS  CHF - last ECHO 03/26/15 with EF 35-40%, diffuse hypokinesis, grade 1 diastolic dysfunction, PA Peak pressure 41 PAH - noted on ECHO above Medical Non-compliance   Plan: MV support, 8 cc/kg, Agitation remains an issue. Consider increasing po sedation or adding Risperdal  Continue PSV weans as tolerated. Tolerated 14/7 for 2 hrs today(was 12/5last week) Negative balance as tolerated  Trach care per protocol  Push PT efforts, mobilize patient as able  Placement will be a difficult issue for Mr. Teters with ESRD and tracheostomy.  Will work toward ultimate decannulation if possible but in the past he has been non-compliant with therapies. Decannulation in the near future is very unlikely.   Other Medical Issues per [facility]  Thrombocytopenia  Anemia  Coagulopathy  Chronic Hypotension  ESRD on HD  Protein Calorie Malnutrition   Joneen Roach, AGACNP-BC [place] [facility]/Critical Care Pager [phone] or [phone]  04/28/2015 12:54 PM  Attending Note:  55 year old male with CHF and obesity, frequent admissions for hypercarbic respiratory failure, very  agitated, trached in cone.  On exam, remains agitated, lungs with coarse BS diffusely.  I reviewed CXR myself, pulmonary edema evident.  Discussed with PCCM-NP and SSH-RT.  Respiratory failure: deconditioning and chronic medical issues.  - PS wean target of 12 hours today.  Hypoxemia:  - Needs fluid negative.  - Titrate O2 for sat of 88-92%.  Trach status:  - OSA history no decannulation  unless looses significant weight.  - Maintain cuffed and current size.  Agitation:  - Add respirdal  - Rely less on benzos.  Patient seen and examined, agree with above note.  I dictated the care and orders written for this patient under my direction.  Alyson Reedy, MD [phone]

## 2015-04-29 LAB — RENAL FUNCTION PANEL
ALBUMIN: 2.7 g/dL — AB (ref 3.5–5.0)
Anion gap: 16 — ABNORMAL HIGH (ref 5–15)
BUN: 74 mg/dL — AB (ref 6–20)
CO2: 27 mmol/L (ref 22–32)
CREATININE: 9.58 mg/dL — AB (ref 0.61–1.24)
Calcium: 8.9 mg/dL (ref 8.9–10.3)
Chloride: 92 mmol/L — ABNORMAL LOW (ref 101–111)
GFR calc Af Amer: 6 mL/min — ABNORMAL LOW (ref >60)
GFR, EST NON AFRICAN AMERICAN: 5 mL/min — AB (ref >60)
GLUCOSE: 100 mg/dL — AB (ref 65–99)
PHOSPHORUS: 6.2 mg/dL — AB (ref 2.5–4.6)
POTASSIUM: 3.9 mmol/L (ref 3.5–5.1)
Sodium: 135 mmol/L (ref 135–145)

## 2015-04-29 LAB — CBC
HEMATOCRIT: 25.5 % — AB (ref 39.0–52.0)
Hemoglobin: 7.9 g/dL — ABNORMAL LOW (ref 13.0–17.0)
MCH: 29.4 pg (ref 26.0–34.0)
MCHC: 31 g/dL (ref 30.0–36.0)
MCV: 94.8 fL (ref 78.0–100.0)
PLATELETS: 182 10*3/uL (ref 150–400)
RBC: 2.69 MIL/uL — ABNORMAL LOW (ref 4.22–5.81)
RDW: 19.6 % — AB (ref 11.5–15.5)
WBC: 5.9 10*3/uL (ref 4.0–10.5)

## 2015-04-29 LAB — CULTURE, BLOOD (ROUTINE X 2)
CULTURE: NO GROWTH
CULTURE: NO GROWTH

## 2015-04-29 NOTE — Progress Notes (Signed)
Subjective:  Patient seen at bedside at the end of HD Remains delirious. Requiring restraints. Slightly agitated today as well.  Still remains on the ventilator with FiO2 of 55 and PEEP of 7. 3000 cc removed with HD     Objective:  Vital signs in last 24 hours:  Temperature 98.3   P 104   blood pressure 170/78  Physical Exam: General: Laying in bed, chronically ill appearing  HEENT anicteric  Neck Trach in place, secretions noted around the trach.  Pulm/lungs Vent assisted, coarse breath sounds, FiO2 35%  CVS/Heart S1S2 no rubs  Abdomen:  Soft, mild distension,   Extremities: + dependent edema in upper arms  Neurologic: Awake, but not following commands, slightly agitated  Skin: No acute rashes  Access: Rt IJ PC       Basic Metabolic Panel:   Recent Labs Lab 04/24/15 0635 04/26/15 0535 04/27/15 0625 04/28/15 0545 04/29/15 0705  NA 134* 134* 134* 135 135  K 4.1 5.6* 4.0 3.5 3.9  CL 91* 92* 91* 92* 92*  CO2 26 27 27 27 27   GLUCOSE 91 85 97 97 100*  BUN 82* 74* 88* 45* 74*  CREATININE 9.85* 9.09* 10.81* 7.45* 9.58*  CALCIUM 8.7* 6.9* 8.6* 9.1 8.9  PHOS 5.1*  --  5.5*  --  6.2*     CBC:  Recent Labs Lab 04/24/15 0818 04/26/15 0535 04/26/15 0639 04/27/15 0625 04/28/15 0545 04/29/15 0705  WBC 8.2 5.9  --  6.5 5.5 5.9  HGB 7.9* 6.5* 8.0* 7.6* 8.3* 7.9*  HCT 25.2* 20.0* 25.6* 24.7* 26.9* 25.5*  MCV 92.0 90.9  --  92.5 93.7 94.8  PLT 175 189  --  228 179 182      Microbiology:  Recent Results (from the past 720 hour(s))  Respiratory virus panel     Status: None   Collection Time: 04/01/15  4:22 PM  Result Value Ref Range Status   Respiratory Syncytial Virus A Negative Negative Final   Respiratory Syncytial Virus B Negative Negative Final   Influenza A Negative Negative Final   Influenza B Negative Negative Final   Parainfluenza 1 Negative Negative Final   Parainfluenza 2 Negative Negative Final   Parainfluenza 3 Negative Negative Final   Metapneumovirus Negative Negative Final   Rhinovirus Negative Negative Final   Adenovirus Negative Negative Final    Comment: (NOTE) Performed At: [facility] [address] [place], [place] [id number] Mila Homer MD KM:[id number]   Culture, respiratory (tracheal aspirate)     Status: None   Collection Time: 04/02/15 12:15 AM  Result Value Ref Range Status   Specimen Description TRACHEAL ASPIRATE  Final   Special Requests Normal  Final   Gram Stain   Final    RARE WBC PRESENT, PREDOMINANTLY MONONUCLEAR NO SQUAMOUS EPITHELIAL CELLS SEEN NO ORGANISMS SEEN Performed at [facility]    Culture   Final    NORMAL OROPHARYNGEAL FLORA Performed at [facility]    Report Status 04/05/2015 FINAL  Final  Culture, respiratory (NON-Expectorated)     Status: None   Collection Time: 04/22/15  3:20 PM  Result Value Ref Range Status   Specimen Description TRACHEAL ASPIRATE  Final   Special Requests NONE  Final   Gram Stain   Final    ABUNDANT WBC PRESENT,BOTH PMN AND MONONUCLEAR RARE SQUAMOUS EPITHELIAL CELLS PRESENT FEW GRAM POSITIVE RODS Performed at [facility]    Culture   Final    ABUNDANT DIPHTHEROIDS(CORYNEBACTERIUM SPECIES) Note:  Standardized susceptibility testing for this organism is not available. Performed at [facility]    Report Status 04/24/2015 FINAL  Final  Culture, blood (routine x 2)     Status: None   Collection Time: 04/24/15  9:47 AM  Result Value Ref Range Status   Specimen Description BLOOD CENTRAL LINE  Final   Special Requests BOTTLES DRAWN AEROBIC AND ANAEROBIC 5CC  Final   Culture NO GROWTH 5 DAYS  Final   Report Status 04/29/2015 FINAL  Final  Culture, blood (routine x 2)     Status: None   Collection Time: 04/24/15 11:20 AM  Result Value Ref Range Status   Specimen Description BLOOD RIGHT ANTECUBITAL  Final   Special Requests BOTTLES DRAWN AEROBIC AND ANAEROBIC 10CC  Final   Culture NO GROWTH 5 DAYS   Final   Report Status 04/29/2015 FINAL  Final    Coagulation Studies: No results for input(s): LABPROT, INR in the last 72 hours.  Urinalysis: No results for input(s): COLORURINE, LABSPEC, PHURINE, GLUCOSEU, HGBUR, BILIRUBINUR, KETONESUR, PROTEINUR, UROBILINOGEN, NITRITE, LEUKOCYTESUR in the last 72 hours.  Invalid input(s): APPERANCEUR    Imaging: No results found.   Medications:    ARANESP 100 Q WED   Assessment/ Plan:  55 y.o. male  with a PMHX of ESRD, hypertension, congestive heart failure, was admitted on 04/15/2015  [facility]  1. ESRD 2. Acute respiratory failure- Currently ventilator dependent 3. Anemia of chronic kidney disease- current hemoglobin down to 7.9 4. Secondary hyperparathyroidism 5. Thrombocytopenia, HIT POSITIVE- use citrate in catheter. 6. Hypokalemia  Plan: 3000 cc removed with HD We will continue dialysis on a Monday, Wednesday, Friday schedule. Patient remains on a ventilator . Continue to pack catheter with citrate. Remains overall quite ill. Guarded prognosis.  LOS:  Mosetta Pigeon 2/8/20174:36 PM

## 2015-04-30 DIAGNOSIS — J9621 Acute and chronic respiratory failure with hypoxia: Secondary | ICD-10-CM

## 2015-04-30 NOTE — Progress Notes (Signed)
   Name: Gary Rivera MRN: [id number] DOB: 1960/12/22    ADMISSION DATE:  04/15/2015 CONSULTATION DATE:  04/16/15  REFERRING MD :  Dr. Sharyon Medicus   CHIEF COMPLAINT:  Tracheostomy dependent respiratory failure    DISCUSSION:   55 yo male admitted to [facility] from 03/29/15 to 04/15/15 with acute on chronic hypoxic/hypercapnic respiratory failure and pulmonary edema.  He eventually required tracheostomy.  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  He has hx of ESRD on HD, CHF, anemia, anxiety.    SUBJECTIVE:  Plan for 10 hrs pressure support.  VITAL SIGNS: Temp 98.5, HR 90, RR 28, BP 124/72, SpO2 100%  PHYSICAL EXAMINATION: General: pleasant HEENT: trach site clean Cardiac: regular Chest: no wheeze Abd: soft, non tender Ext: 1+ edema Neuro: follows commands  CMP Latest Ref Rng 04/29/2015 04/28/2015 04/27/2015  Glucose 65 - 99 mg/dL 488(Q) 97 97  BUN 6 - 20 mg/dL 91(Q) 94(H) 03(U)  Creatinine 0.61 - 1.24 mg/dL 8.82(C) 0.03(K) 91.79(X)  Sodium 135 - 145 mmol/L 135 135 134(L)  Potassium 3.5 - 5.1 mmol/L 3.9 3.5 4.0  Chloride 101 - 111 mmol/L 92(L) 92(L) 91(L)  CO2 22 - 32 mmol/L 27 27 27   Calcium 8.9 - 10.3 mg/dL 8.9 9.1 5.0(V)    CBC Latest Ref Rng 04/29/2015 04/28/2015 04/27/2015  WBC 4.0 - 10.5 K/uL 5.9 5.5 6.5  Hemoglobin 13.0 - 17.0 g/dL 7.9(L) 8.3(L) 7.6(L)  Hematocrit 39.0 - 52.0 % 25.5(L) 26.9(L) 24.7(L)  Platelets 150 - 400 K/uL 182 179 228    No results found.   SIGNIFICANT EVENTS  1/8-1/25  Admit to [facility] for acute on chronic hypoxemic / hypercapnic respiratory failure, s/p trach 1/25  Tx to [facility] for vent weaning  1/26  PCCM consulted for evaluation   STUDIES: 1/05 Echo >> EF 35 to 40%, grade 1 diastolic dysfx, PAS 41 mmHg  TUBES: 1/20 Trach (Dr. Pollyann Kennedy) >>   ASSESSMENT / PLAN:  Acute on chronic hypoxic/hypercapnic respiratory failure. Failure to wean from ventilator s/p tracheostomy. Presumed  OSA/OHS. Plan: - pressure support wean as tolerated - f/u CXR next week  Combined CHF. ESRD on HD. Pulmonary edema. Plan: - per primary team and nephrology  Coralyn Helling, MD [facility] Pulmonary/Critical Care 04/30/2015, 12:01 PM Pager:  [phone] After 3pm call: [phone]

## 2015-05-01 LAB — CBC
HEMATOCRIT: 24.7 % — AB (ref 39.0–52.0)
Hemoglobin: 7.3 g/dL — ABNORMAL LOW (ref 13.0–17.0)
MCH: 27.9 pg (ref 26.0–34.0)
MCHC: 29.6 g/dL — AB (ref 30.0–36.0)
MCV: 94.3 fL (ref 78.0–100.0)
Platelets: 154 10*3/uL (ref 150–400)
RBC: 2.62 MIL/uL — AB (ref 4.22–5.81)
RDW: 19.7 % — ABNORMAL HIGH (ref 11.5–15.5)
WBC: 6.2 10*3/uL (ref 4.0–10.5)

## 2015-05-01 LAB — RENAL FUNCTION PANEL
ANION GAP: 17 — AB (ref 5–15)
Albumin: 2.5 g/dL — ABNORMAL LOW (ref 3.5–5.0)
BUN: 72 mg/dL — ABNORMAL HIGH (ref 6–20)
CHLORIDE: 93 mmol/L — AB (ref 101–111)
CO2: 25 mmol/L (ref 22–32)
Calcium: 8.8 mg/dL — ABNORMAL LOW (ref 8.9–10.3)
Creatinine, Ser: 8.72 mg/dL — ABNORMAL HIGH (ref 0.61–1.24)
GFR calc Af Amer: 7 mL/min — ABNORMAL LOW (ref >60)
GFR calc non Af Amer: 6 mL/min — ABNORMAL LOW (ref >60)
GLUCOSE: 81 mg/dL (ref 65–99)
POTASSIUM: 3.5 mmol/L (ref 3.5–5.1)
Phosphorus: 4.7 mg/dL — ABNORMAL HIGH (ref 2.5–4.6)
Sodium: 135 mmol/L (ref 135–145)

## 2015-05-01 NOTE — Progress Notes (Signed)
Subjective:  Patient still requiring restraints. Resting quietly today Still remains on the ventilator with FiO2 of 35 and PEEP of 5 3000 cc removed with HD last treatment     Objective:  Vital signs in last 24 hours:  Temperature 98.4   P 68   blood pressure 123/76  Physical Exam: General: Laying in bed, chronically ill appearing  HEENT anicteric  Neck Trach in place, secretions noted around the trach.  Pulm/lungs Vent assisted, coarse breath sounds,   CVS/Heart S1S2 no rubs  Abdomen:  Soft, mild distension,   Extremities: + dependent edema in upper arms  Neurologic: not following commands,   Skin: No acute rashes  Access: Rt IJ PC       Basic Metabolic Panel:   Recent Labs Lab 04/26/15 0535 04/27/15 0625 04/28/15 0545 04/29/15 0705 05/01/15 0700  NA 134* 134* 135 135 135  K 5.6* 4.0 3.5 3.9 3.5  CL 92* 91* 92* 92* 93*  CO2 27 27 27 27 25   GLUCOSE 85 97 97 100* 81  BUN 74* 88* 45* 74* 72*  CREATININE 9.09* 10.81* 7.45* 9.58* 8.72*  CALCIUM 6.9* 8.6* 9.1 8.9 8.8*  PHOS  --  5.5*  --  6.2* 4.7*     CBC:  Recent Labs Lab 04/26/15 0535 04/26/15 0639 04/27/15 0625 04/28/15 0545 04/29/15 0705 05/01/15 0700  WBC 5.9  --  6.5 5.5 5.9 6.2  HGB 6.5* 8.0* 7.6* 8.3* 7.9* 7.3*  HCT 20.0* 25.6* 24.7* 26.9* 25.5* 24.7*  MCV 90.9  --  92.5 93.7 94.8 94.3  PLT 189  --  228 179 182 154      Microbiology:  Recent Results (from the past 720 hour(s))  Respiratory virus panel     Status: None   Collection Time: 04/01/15  4:22 PM  Result Value Ref Range Status   Respiratory Syncytial Virus A Negative Negative Final   Respiratory Syncytial Virus B Negative Negative Final   Influenza A Negative Negative Final   Influenza B Negative Negative Final   Parainfluenza 1 Negative Negative Final   Parainfluenza 2 Negative Negative Final   Parainfluenza 3 Negative Negative Final   Metapneumovirus Negative Negative Final   Rhinovirus Negative Negative Final   Adenovirus Negative Negative Final    Comment: (NOTE) Performed At: [facility] [address] [place], [place] [id number] Mila Homer MD LZ:[id number]   Culture, respiratory (tracheal aspirate)     Status: None   Collection Time: 04/02/15 12:15 AM  Result Value Ref Range Status   Specimen Description TRACHEAL ASPIRATE  Final   Special Requests Normal  Final   Gram Stain   Final    RARE WBC PRESENT, PREDOMINANTLY MONONUCLEAR NO SQUAMOUS EPITHELIAL CELLS SEEN NO ORGANISMS SEEN Performed at [facility]    Culture   Final    NORMAL OROPHARYNGEAL FLORA Performed at [facility]    Report Status 04/05/2015 FINAL  Final  Culture, respiratory (NON-Expectorated)     Status: None   Collection Time: 04/22/15  3:20 PM  Result Value Ref Range Status   Specimen Description TRACHEAL ASPIRATE  Final   Special Requests NONE  Final   Gram Stain   Final    ABUNDANT WBC PRESENT,BOTH PMN AND MONONUCLEAR RARE SQUAMOUS EPITHELIAL CELLS PRESENT FEW GRAM POSITIVE RODS Performed at [facility]    Culture   Final    ABUNDANT DIPHTHEROIDS(CORYNEBACTERIUM SPECIES) Note: Standardized susceptibility testing for this organism is not available. Performed at [facility]  Report Status 04/24/2015 FINAL  Final  Culture, blood (routine x 2)     Status: None   Collection Time: 04/24/15  9:47 AM  Result Value Ref Range Status   Specimen Description BLOOD CENTRAL LINE  Final   Special Requests BOTTLES DRAWN AEROBIC AND ANAEROBIC 5CC  Final   Culture NO GROWTH 5 DAYS  Final   Report Status 04/29/2015 FINAL  Final  Culture, blood (routine x 2)     Status: None   Collection Time: 04/24/15 11:20 AM  Result Value Ref Range Status   Specimen Description BLOOD RIGHT ANTECUBITAL  Final   Special Requests BOTTLES DRAWN AEROBIC AND ANAEROBIC 10CC  Final   Culture NO GROWTH 5 DAYS  Final   Report Status 04/29/2015 FINAL  Final    Coagulation Studies: No  results for input(s): LABPROT, INR in the last 72 hours.  Urinalysis: No results for input(s): COLORURINE, LABSPEC, PHURINE, GLUCOSEU, HGBUR, BILIRUBINUR, KETONESUR, PROTEINUR, UROBILINOGEN, NITRITE, LEUKOCYTESUR in the last 72 hours.  Invalid input(s): APPERANCEUR    Imaging: No results found.   Medications:    ARANESP 100 Q WED   Assessment/ Plan:  55 y.o. male  with a PMHX of ESRD, hypertension, congestive heart failure, was admitted on 04/15/2015  He normally dialyzes at [facility]  1. ESRD 2. Acute respiratory failure- Currently ventilator dependent 3. Anemia of chronic kidney disease-   4. Secondary hyperparathyroidism 5. Thrombocytopenia, HIT POSITIVE- use citrate in catheter. 6. Hypokalemia  Plan: 3000 cc removed with HD last treatment We will continue dialysis on a Monday, Wednesday, Friday schedule. Patient remains on a ventilator . Continue to pack catheter with citrate. Remains overall quite ill. Guarded prognosis.    LOS:  Rishard Delange 2/10/20179:10 AM

## 2015-05-04 ENCOUNTER — Other Ambulatory Visit (HOSPITAL_COMMUNITY): Payer: Self-pay

## 2015-05-04 LAB — CBC
HEMATOCRIT: 25.7 % — AB (ref 39.0–52.0)
Hemoglobin: 7.9 g/dL — ABNORMAL LOW (ref 13.0–17.0)
MCH: 28.7 pg (ref 26.0–34.0)
MCHC: 30.7 g/dL (ref 30.0–36.0)
MCV: 93.5 fL (ref 78.0–100.0)
PLATELETS: 126 10*3/uL — AB (ref 150–400)
RBC: 2.75 MIL/uL — ABNORMAL LOW (ref 4.22–5.81)
RDW: 18.9 % — AB (ref 11.5–15.5)
WBC: 5.4 10*3/uL (ref 4.0–10.5)

## 2015-05-04 LAB — RENAL FUNCTION PANEL
Albumin: 2.8 g/dL — ABNORMAL LOW (ref 3.5–5.0)
Anion gap: 18 — ABNORMAL HIGH (ref 5–15)
BUN: 66 mg/dL — AB (ref 6–20)
CHLORIDE: 91 mmol/L — AB (ref 101–111)
CO2: 24 mmol/L (ref 22–32)
CREATININE: 10.29 mg/dL — AB (ref 0.61–1.24)
Calcium: 8.9 mg/dL (ref 8.9–10.3)
GFR, EST AFRICAN AMERICAN: 6 mL/min — AB (ref >60)
GFR, EST NON AFRICAN AMERICAN: 5 mL/min — AB (ref >60)
Glucose, Bld: 91 mg/dL (ref 65–99)
POTASSIUM: 3.6 mmol/L (ref 3.5–5.1)
Phosphorus: 6.5 mg/dL — ABNORMAL HIGH (ref 2.5–4.6)
Sodium: 133 mmol/L — ABNORMAL LOW (ref 135–145)

## 2015-05-04 NOTE — Progress Notes (Signed)
Name: WAREN BERGE MRN: [id number] DOB: 1960-07-03    ADMISSION DATE:  04/15/2015 CONSULTATION DATE:  04/16/15  REFERRING MD :  Dr. Sharyon Medicus   CHIEF COMPLAINT:  Tracheostomy dependent respiratory failure    DISCUSSION:   55 yo male admitted to [facility] from 03/29/15 to 04/15/15 with acute on chronic hypoxic/hypercapnic respiratory failure and pulmonary edema.  He eventually required tracheostomy.  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  He has hx of ESRD on HD, CHF, anemia, anxiety.     SIGNIFICANT EVENTS  1/05 Echo >> EF 35 to 40%, grade 1 diastolic dysfx, PAS 41 mmHg ..............Marland Kitchen 1/8-1/25  Admit to [facility] for acute on chronic hypoxemic / hypercapnic respiratory failure, s/p trach 1/20 [facility] - Dr Pollyann Kennedy ENT 1/25  Tx to [facility] for vent weaning  1/26  PCCM consulted for evaluation  04/30/15 - Plan for 10 hrs pressure support.   SUBJECTIVE/OVERNIGHT/INTERVAL HX 05/04/15 - 4h ATC yesterday. Plant for 8h ATC today and night vent  VITAL SIGNS: Temp 98.5, HR 90, RR 28, BP 124/72, SpO2 100%  PHYSICAL EXAMINATION: General: pleasant, obese, deconditioned HEENT: trach site - small ulcer - non bleeding.  Cardiac: regular Chest: no wheeze Abd: soft, non tender Ext: 1+ edema Neuro: follows commands. Watching TV  PULMONARY No results for input(s): PHART, PCO2ART, PO2ART, HCO3, TCO2, O2SAT in the last 168 hours.  Invalid input(s): PCO2, PO2  CBC  Recent Labs Lab 04/29/15 0705 05/01/15 0700 05/04/15 0625  HGB 7.9* 7.3* 7.9*  HCT 25.5* 24.7* 25.7*  WBC 5.9 6.2 5.4  PLT 182 154 126*    COAGULATION No results for input(s): INR in the last 168 hours.  CARDIAC  No results for input(s): TROPONINI in the last 168 hours. No results for input(s): PROBNP in the last 168 hours.   CHEMISTRY  Recent Labs Lab 04/28/15 0545 04/29/15 0705 05/01/15 0700 05/04/15 0625  NA 135 135 135 133*  K 3.5 3.9 3.5 3.6  CL 92* 92*  93* 91*  CO2 27 27 25 24   GLUCOSE 97 100* 81 91  BUN 45* 74* 72* 66*  CREATININE 7.45* 9.58* 8.72* 10.29*  CALCIUM 9.1 8.9 8.8* 8.9  PHOS  --  6.2* 4.7* 6.5*   CrCl cannot be calculated (Unknown ideal weight.).   LIVER  Recent Labs Lab 04/29/15 0705 05/01/15 0700 05/04/15 0625  ALBUMIN 2.7* 2.5* 2.8*     INFECTIOUS No results for input(s): LATICACIDVEN, PROCALCITON in the last 168 hours.   ENDOCRINE CBG (last 3)  No results for input(s): GLUCAP in the last 72 hours.       IMAGING x48h  - image(s) personally visualized  -   highlighted in bold Dg Chest Port 1 View  05/04/2015  CLINICAL DATA:  Acute and chronic respiratory failure, history of CHF, end-stage renal disease EXAM: PORTABLE CHEST 1 VIEW COMPARISON:  Portable chest x-ray of April 17, 2015 FINDINGS: The right lung remains hypoinflated. There is persistent perihilar subsegmental atelectasis. The left lung remains well-expanded. The interstitial markings remain increased. The cardiac silhouette remains enlarged and the pulmonary vascularity engorged. The tracheostomy appliance tip projects at the superior margin of the clavicular head. The dual-lumen dialysis type catheter tip projects over the midportion of the SVC. The nasogastric tube has been removed. There is no pneumothorax nor large pleural effusion. IMPRESSION: Persistent perihilar subsegmental atelectasis. Mild CHF without pulmonary edema or pleural effusion. The support tubes are in reasonable position. Electronically Signed   By:  David  [place] M.D.   On: 05/04/2015 07:35       ASSESSMENT / PLAN:  Acute on chronic hypoxic/hypercapnic respiratory failure. Failure to wean from ventilator s/p tracheostomy. Presumed OSA/OHS.   - aiming for 8h ATC 05/04/15 wioth night vent  Plan: - wean as tolerated; night vent - f/u CXR next week - given OSA presumption and obesity and eSRD doubt good decannulation candidate  Combined CHF. ESRD on HD. Pulmonary  edema. Plan: - per primary team and nephrology    Dr. Kalman Shan, M.D., [facility].C.P Pulmonary and Critical Care Medicine Staff Physician [facility] Pager: [phone], If no answer or between  15:00h - 7:00h: call [phone]  05/04/2015 11:18 AM

## 2015-05-04 NOTE — Progress Notes (Signed)
Subjective:  Patient seen during dialysis. Tolerating well. UF rate increased to 3kg.     Objective:  Vital signs in last 24 hours:  Temperature 90.8 pulse 88 respirations 20 blood pressure 163/69  Physical Exam: General: Laying in bed, chronically ill appearinghas arm restraints on  HEENT anicteric  Neck Trach in place,  Pulm/lungs off ventilator, bilateral rhonchi   CVS/Heart S1S2 no rubs  Abdomen:  Soft, mild distension, bowel sounds present  Extremities: + dependent edema in upper arms  Neurologic: Awake, alert, follows simple commands  Skin: No acute rashes  Access: Rt IJ PC       Basic Metabolic Panel:   Recent Labs Lab 04/28/15 0545 04/29/15 0705 05/01/15 0700 05/04/15 0625  NA 135 135 135 133*  K 3.5 3.9 3.5 3.6  CL 92* 92* 93* 91*  CO2 GLUCOSE 97 100* 81 91  BUN 45* 74* 72* 66*  CREATININE 7.45* 9.58* 8.72* 10.29*  CALCIUM 9.1 8.9 8.8* 8.9  PHOS  --  6.2* 4.7* 6.5*     CBC:  Recent Labs Lab 04/28/15 0545 04/29/15 0705 05/01/15 0700 05/04/15 0625  WBC 5.5 5.9 6.2 5.4  HGB 8.3* 7.9* 7.3* 7.9*  HCT 26.9* 25.5* 24.7* 25.7*  MCV 93.7 94.8 94.3 93.5  PLT 179 182 154 126*      Microbiology:  Recent Results (from the past 720 hour(s))  Culture, respiratory (NON-Expectorated)     Status: None   Collection Time: 04/22/15  3:20 PM  Result Value Ref Range Status   Specimen Description TRACHEAL ASPIRATE  Final   Special Requests NONE  Final   Gram Stain   Final    ABUNDANT WBC PRESENT,BOTH PMN AND MONONUCLEAR RARE SQUAMOUS EPITHELIAL CELLS PRESENT FEW GRAM POSITIVE RODS Performed at [facility]    Culture   Final    ABUNDANT DIPHTHEROIDS(CORYNEBACTERIUM SPECIES) Note: Standardized susceptibility testing for this organism is not available. Performed at [facility]    Report Status 04/24/2015 FINAL  Final  Culture, blood (routine x 2)     Status: None   Collection Time: 04/24/15  9:47 AM  Result Value Ref  Range Status   Specimen Description BLOOD CENTRAL LINE  Final   Special Requests BOTTLES DRAWN AEROBIC AND ANAEROBIC 5CC  Final   Culture NO GROWTH 5 DAYS  Final   Report Status 04/29/2015 FINAL  Final  Culture, blood (routine x 2)     Status: None   Collection Time: 04/24/15 11:20 AM  Result Value Ref Range Status   Specimen Description BLOOD RIGHT ANTECUBITAL  Final   Special Requests BOTTLES DRAWN AEROBIC AND ANAEROBIC 10CC  Final   Culture NO GROWTH 5 DAYS  Final   Report Status 04/29/2015 FINAL  Final    Coagulation Studies: No results for input(s): LABPROT, INR in the last 72 hours.  Urinalysis: No results for input(s): COLORURINE, LABSPEC, PHURINE, GLUCOSEU, HGBUR, BILIRUBINUR, KETONESUR, PROTEINUR, UROBILINOGEN, NITRITE, LEUKOCYTESUR in the last 72 hours.  Invalid input(s): APPERANCEUR    Imaging: Dg Chest Port 1 View  05/04/2015  CLINICAL DATA:  Acute and chronic respiratory failure, history of CHF, end-stage renal disease EXAM: PORTABLE CHEST 1 VIEW COMPARISON:  Portable chest x-ray of April 17, 2015 FINDINGS: The right lung remains hypoinflated. There is persistent perihilar subsegmental atelectasis. The left lung remains well-expanded. The interstitial markings remain increased. The cardiac silhouette remains enlarged and the pulmonary vascularity engorged. The tracheostomy appliance tip projects at the superior margin of the  clavicular head. The dual-lumen dialysis type catheter tip projects over the midportion of the SVC. The nasogastric tube has been removed. There is no pneumothorax nor large pleural effusion. IMPRESSION: Persistent perihilar subsegmental atelectasis. Mild CHF without pulmonary edema or pleural effusion. The support tubes are in reasonable position. Electronically Signed   By: David  [place] M.D.   On: 05/04/2015 07:35     Medications:    ARANESP 100 Q WED   Assessment/ Plan:  55 y.o. male  with a PMHX of ESRD, hypertension, congestive heart  failure, was admitted on 04/15/2015  He normally dialyzes at the [facility]  1. ESRD 2. Acute respiratory failure 3. Anemia of chronic kidney disease-   4. Secondary hyperparathyroidism 5. Thrombocytopenia, HIT POSITIVE- use citrate in catheter. 6. Hypokalemia  Plan: Patient seen and evaluated during hemodialysis treatment. He appears to be tolerating well. Ultrafiltration target increased to 3.5 kg. Currently off the ventilator and appears to be doing well. Hemoglobin currently 7.9, continue Aranesp. Continue to pack catheter with citrate. We will continue to follow with you.    LOS:  Fitzhugh Vizcarrondo 2/13/20173:02 PM

## 2015-05-06 ENCOUNTER — Other Ambulatory Visit (HOSPITAL_COMMUNITY): Payer: Self-pay

## 2015-05-06 LAB — BLOOD GAS, ARTERIAL
ACID-BASE EXCESS: 0.6 mmol/L (ref 0.0–2.0)
Bicarbonate: 24.9 mEq/L — ABNORMAL HIGH (ref 20.0–24.0)
FIO2: 1
MECHVT: 600 mL
O2 SAT: 99.2 %
PATIENT TEMPERATURE: 98.6
PCO2 ART: 41.2 mmHg (ref 35.0–45.0)
PEEP/CPAP: 7 cmH2O
PH ART: 7.397 (ref 7.350–7.450)
PO2 ART: 124 mmHg — AB (ref 80.0–100.0)
RATE: 20 resp/min
TCO2: 26.1 mmol/L (ref 0–100)

## 2015-05-06 LAB — CBC WITH DIFFERENTIAL/PLATELET
BASOS ABS: 0 10*3/uL (ref 0.0–0.1)
BASOS PCT: 0 %
Eosinophils Absolute: 0.2 10*3/uL (ref 0.0–0.7)
Eosinophils Relative: 2 %
HEMATOCRIT: 28.1 % — AB (ref 39.0–52.0)
HEMOGLOBIN: 8.3 g/dL — AB (ref 13.0–17.0)
LYMPHS ABS: 1 10*3/uL (ref 0.7–4.0)
Lymphocytes Relative: 10 %
MCH: 28.2 pg (ref 26.0–34.0)
MCHC: 29.5 g/dL — AB (ref 30.0–36.0)
MCV: 95.6 fL (ref 78.0–100.0)
MONO ABS: 1 10*3/uL (ref 0.1–1.0)
Monocytes Relative: 10 %
Neutro Abs: 7.6 10*3/uL (ref 1.7–7.7)
Neutrophils Relative %: 78 %
Platelets: 131 10*3/uL — ABNORMAL LOW (ref 150–400)
RBC: 2.94 MIL/uL — ABNORMAL LOW (ref 4.22–5.81)
RDW: 18.2 % — ABNORMAL HIGH (ref 11.5–15.5)
WBC: 9.8 10*3/uL (ref 4.0–10.5)

## 2015-05-06 LAB — TROPONIN I: Troponin I: 0.1 ng/mL — ABNORMAL HIGH (ref <0.031)

## 2015-05-06 LAB — MAGNESIUM: Magnesium: 3 mg/dL — ABNORMAL HIGH (ref 1.7–2.4)

## 2015-05-06 LAB — RENAL FUNCTION PANEL
Albumin: 3 g/dL — ABNORMAL LOW (ref 3.5–5.0)
Anion gap: 18 — ABNORMAL HIGH (ref 5–15)
BUN: 58 mg/dL — ABNORMAL HIGH (ref 6–20)
CALCIUM: 10.2 mg/dL (ref 8.9–10.3)
CO2: 25 mmol/L (ref 22–32)
CREATININE: 9.58 mg/dL — AB (ref 0.61–1.24)
Chloride: 94 mmol/L — ABNORMAL LOW (ref 101–111)
GFR, EST AFRICAN AMERICAN: 6 mL/min — AB (ref >60)
GFR, EST NON AFRICAN AMERICAN: 5 mL/min — AB (ref >60)
Glucose, Bld: 113 mg/dL — ABNORMAL HIGH (ref 65–99)
Phosphorus: 8.4 mg/dL — ABNORMAL HIGH (ref 2.5–4.6)
Potassium: 4.2 mmol/L (ref 3.5–5.1)
SODIUM: 137 mmol/L (ref 135–145)

## 2015-05-06 LAB — LACTIC ACID, PLASMA: LACTIC ACID, VENOUS: 3 mmol/L — AB (ref 0.5–2.0)

## 2015-05-06 NOTE — Progress Notes (Addendum)
Subjective:  Pt coded this AM.  Brady'd at first then had PEA. Now awake and alert, but very agitated.  On the vent, fio2 100%. Family at bedside. Dialysis held at the moment.      Objective:  Vital signs in last 24 hours: Pulse 160 respirations 34 blood pressure 131/69 FiO2 100%  Physical Exam: General: Critically ill appearing  HEENT anicteric  Neck Trach in place  Pulm/lungs bilateral rhonchi, fio2 100%  CVS/Heart S1S2 tachycardic  Abdomen:  Soft, mild distension, bowel sounds present  Extremities: + dependent edema in upper arms  Neurologic: Awake, alert, agitated  Skin: No acute rashes  Access: Rt IJ PC       Basic Metabolic Panel:   Recent Labs Lab 05/01/15 0700 05/04/15 0625 05/06/15 0917  NA 135 133* 137  K 3.5 3.6 4.2  CL 93* 91* 94*  CO2 GLUCOSE 81 91 113*  BUN 72* 66* 58*  CREATININE 8.72* 10.29* 9.58*  CALCIUM 8.8* 8.9 10.2  MG  --   --  3.0*  PHOS 4.7* 6.5* 8.4*     CBC:  Recent Labs Lab 05/01/15 0700 05/04/15 0625 05/06/15 0917  WBC 6.2 5.4 9.8  NEUTROABS  --   --  7.6  HGB 7.3* 7.9* 8.3*  HCT 24.7* 25.7* 28.1*  MCV 94.3 93.5 95.6  PLT 154 126* PENDING      Microbiology:  Recent Results (from the past 720 hour(s))  Culture, respiratory (NON-Expectorated)     Status: None   Collection Time: 04/22/15  3:20 PM  Result Value Ref Range Status   Specimen Description TRACHEAL ASPIRATE  Final   Special Requests NONE  Final   Gram Stain   Final    ABUNDANT WBC PRESENT,BOTH PMN AND MONONUCLEAR RARE SQUAMOUS EPITHELIAL CELLS PRESENT FEW GRAM POSITIVE RODS Performed at [facility]    Culture   Final    ABUNDANT DIPHTHEROIDS(CORYNEBACTERIUM SPECIES) Note: Standardized susceptibility testing for this organism is not available. Performed at [facility]    Report Status 04/24/2015 FINAL  Final  Culture, blood (routine x 2)     Status: None   Collection Time: 04/24/15  9:47 AM  Result Value Ref Range  Status   Specimen Description BLOOD CENTRAL LINE  Final   Special Requests BOTTLES DRAWN AEROBIC AND ANAEROBIC 5CC  Final   Culture NO GROWTH 5 DAYS  Final   Report Status 04/29/2015 FINAL  Final  Culture, blood (routine x 2)     Status: None   Collection Time: 04/24/15 11:20 AM  Result Value Ref Range Status   Specimen Description BLOOD RIGHT ANTECUBITAL  Final   Special Requests BOTTLES DRAWN AEROBIC AND ANAEROBIC 10CC  Final   Culture NO GROWTH 5 DAYS  Final   Report Status 04/29/2015 FINAL  Final    Coagulation Studies: No results for input(s): LABPROT, INR in the last 72 hours.  Urinalysis: No results for input(s): COLORURINE, LABSPEC, PHURINE, GLUCOSEU, HGBUR, BILIRUBINUR, KETONESUR, PROTEINUR, UROBILINOGEN, NITRITE, LEUKOCYTESUR in the last 72 hours.  Invalid input(s): APPERANCEUR    Imaging: Dg Chest Port 1 View  05/06/2015  CLINICAL DATA:  Hypoxia, status post cardiac arrest EXAM: PORTABLE CHEST 1 VIEW COMPARISON:  May 04, 2015 FINDINGS: Tracheostomy catheter tip is 7.0 cm above the carina. Dual-lumen central catheter tip is at the cavoatrial junction. Left jugular catheter tip is in the left innominate vein near the junction with the superior vena cava. No pneumothorax. There is persistent elevation of  the right hemidiaphragm. There is no edema or consolidation. Cardiomegaly is stable. No adenopathy. IMPRESSION: Tube and catheter positions as described without pneumothorax. Stable cardiac enlargement. No edema or consolidation. Stable elevation of the right hemidiaphragm. Electronically Signed   By: Bretta Bang III M.D.   On: 05/06/2015 08:47     Medications:    ARANESP 100 Q WED   Assessment/ Plan:  55 y.o. male  with a PMHX of ESRD, hypertension, congestive heart failure, was admitted on 04/15/2015  He normally dialyzes at the [facility]  1. ESRD 2. Acute respiratory failure 3. Anemia of chronic kidney  disease-   4. Secondary hyperparathyroidism 5. Thrombocytopenia, HIT POSITIVE- use citrate in catheter. 6. Hypokalemia 7. S/p PEA arrest 05/06/15  Plan: Patient had PEA arrest today. He appears to be hemodynamically unstable at the present with heart rate in the 160s. Therefore at this time we will hold hemodialysis. We will reassess him for dialysis later this week. Patient is currently back on the ventilator at 100% FiO2. Hospitalist and respiratory therapy are following very closely. Overall prognosis very guarded at the moment.     LOS:  Shawntavia Saunders 2/15/201710:37 AM

## 2015-05-07 DIAGNOSIS — J9620 Acute and chronic respiratory failure, unspecified whether with hypoxia or hypercapnia: Secondary | ICD-10-CM

## 2015-05-07 LAB — TROPONIN I
TROPONIN I: 0.37 ng/mL — AB (ref <0.031)
TROPONIN I: 0.27 ng/mL — AB (ref <0.031)

## 2015-05-07 NOTE — Progress Notes (Signed)
Name: LECIL TAPP MRN: [id number] DOB: 01/02/1961    ADMISSION DATE:  04/15/2015 CONSULTATION DATE:  04/16/15  REFERRING MD :  Dr. Sharyon Medicus   CHIEF COMPLAINT:  Tracheostomy dependent respiratory failure    DISCUSSION:   55 yo male admitted to [facility] from 03/29/15 to 04/15/15 with acute on chronic hypoxic/hypercapnic respiratory failure and pulmonary edema.  He eventually required tracheostomy.  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  He has hx of ESRD on HD, CHF, anemia, anxiety.     SIGNIFICANT EVENTS  1/05 Echo >> EF 35 to 40%, grade 1 diastolic dysfx, PAS 41 mmHg ..............Marland Kitchen 1/8-1/25  Admit to [facility] for acute on chronic hypoxemic / hypercapnic respiratory failure, s/p trach 1/20 [facility] - Dr Pollyann Kennedy ENT 1/25  Tx to [facility] for vent weaning  1/26  PCCM consulted for evaluation  2/15 brady -PEA arrest on ATC x 24h   SUBJECTIVE/OVERNIGHT/INTERVAL HX Events from yesterday noted Now back on vent  VITAL SIGNS: Afebrile, heart rate 110 to 120s ir regular, saturation 100%  PHYSICAL EXAMINATION: General: Chronically ill-appearing, obese, deconditioned HEENT: trach site - small ulcer - non bleeding.  Cardiac: irregular, tachycardia Chest: no wheeze Abd: soft, non tender Ext: 1+ edema Neuro: follows command, squeeze my hand and stick out his tongue  PULMONARY  Recent Labs Lab 05/06/15 0727  PHART 7.397  PCO2ART 41.2  PO2ART 124*  HCO3 24.9*  TCO2 26.1  O2SAT 99.2    CBC  Recent Labs Lab 05/01/15 0700 05/04/15 0625 05/06/15 0917  HGB 7.3* 7.9* 8.3*  HCT 24.7* 25.7* 28.1*  WBC 6.2 5.4 9.8  PLT 154 126* 131*    COAGULATION No results for input(s): INR in the last 168 hours.  CARDIAC    Recent Labs Lab 05/06/15 0917  TROPONINI 0.10*   No results for input(s): PROBNP in the last 168 hours.   CHEMISTRY  Recent Labs Lab 05/01/15 0700 05/04/15 0625 05/06/15 0917  NA 135 133* 137  K 3.5  3.6 4.2  CL 93* 91* 94*  CO2 GLUCOSE 81 91 113*  BUN 72* 66* 58*  CREATININE 8.72* 10.29* 9.58*  CALCIUM 8.8* 8.9 10.2  MG  --   --  3.0*  PHOS 4.7* 6.5* 8.4*   CrCl cannot be calculated (Unknown ideal weight.).   LIVER  Recent Labs Lab 05/01/15 0700 05/04/15 0625 05/06/15 0917  ALBUMIN 2.5* 2.8* 3.0*     INFECTIOUS  Recent Labs Lab 05/06/15 0916  LATICACIDVEN 3.0*     ENDOCRINE CBG (last 3)  No results for input(s): GLUCAP in the last 72 hours.       IMAGING x48h  - image(s) personally visualized  -   highlighted in bold Dg Chest Port 1 View  05/06/2015  CLINICAL DATA:  Hypoxia, status post cardiac arrest EXAM: PORTABLE CHEST 1 VIEW COMPARISON:  May 04, 2015 FINDINGS: Tracheostomy catheter tip is 7.0 cm above the carina. Dual-lumen central catheter tip is at the cavoatrial junction. Left jugular catheter tip is in the left innominate vein near the junction with the superior vena cava. No pneumothorax. There is persistent elevation of the right hemidiaphragm. There is no edema or consolidation. Cardiomegaly is stable. No adenopathy. IMPRESSION: Tube and catheter positions as described without pneumothorax. Stable cardiac enlargement. No edema or consolidation. Stable elevation of the right hemidiaphragm. Electronically Signed   By: Bretta Bang III M.D.   On: 05/06/2015 08:47  ASSESSMENT / PLAN: PEA arrest 2/15 - unclear etiology, primary cardiac versus respiratory arrest  Acute on chronic hypoxic/hypercapnic respiratory failure. Failure to wean from ventilator s/p tracheostomy. Chronic elevation of right hemidiaphragm -unclear if this is playing a role in his respiratory failure Presumed OSA/OHS.   Plan: -Hold off SBTs today since he remains tachycardic, would resume at some point once heart rate better controlled, he may need nocturnal ventilation long-term - given OSA presumption and obesity and eSRD doubt good decannulation  candidate  Combined CHF - EF 35%. ESRD on HD. Pulmonary edema. Plan: - per primary team and nephrology - Central line per primary team  Discussed with hospitalist on morning rounds   Cyril Mourning MD. [facility]. [place] [facility] & Critical care Pager [phone] If no response call [phone]    05/07/2015 12:12 PM

## 2015-05-08 LAB — RENAL FUNCTION PANEL
Albumin: 3.1 g/dL — ABNORMAL LOW (ref 3.5–5.0)
Anion gap: 22 — ABNORMAL HIGH (ref 5–15)
BUN: 94 mg/dL — AB (ref 6–20)
CALCIUM: 8.9 mg/dL (ref 8.9–10.3)
CO2: 24 mmol/L (ref 22–32)
CREATININE: 12.32 mg/dL — AB (ref 0.61–1.24)
Chloride: 91 mmol/L — ABNORMAL LOW (ref 101–111)
GFR calc non Af Amer: 4 mL/min — ABNORMAL LOW (ref >60)
GFR, EST AFRICAN AMERICAN: 5 mL/min — AB (ref >60)
GLUCOSE: 142 mg/dL — AB (ref 65–99)
Phosphorus: 5.8 mg/dL — ABNORMAL HIGH (ref 2.5–4.6)
Potassium: 4.3 mmol/L (ref 3.5–5.1)
SODIUM: 137 mmol/L (ref 135–145)

## 2015-05-08 LAB — CBC
HCT: 26.6 % — ABNORMAL LOW (ref 39.0–52.0)
Hemoglobin: 8.2 g/dL — ABNORMAL LOW (ref 13.0–17.0)
MCH: 28.6 pg (ref 26.0–34.0)
MCHC: 30.8 g/dL (ref 30.0–36.0)
MCV: 92.7 fL (ref 78.0–100.0)
PLATELETS: 136 10*3/uL — AB (ref 150–400)
RBC: 2.87 MIL/uL — AB (ref 4.22–5.81)
RDW: 18.9 % — AB (ref 11.5–15.5)
WBC: 9.2 10*3/uL (ref 4.0–10.5)

## 2015-05-08 MED FILL — Medication: Qty: 1 | Status: AC

## 2015-05-08 NOTE — Progress Notes (Signed)
Subjective:  Patient seen and evaluated during hemodialysis this a.m. Remains on the ventilator. Still tachycardic as well.    Objective:  Vital signs in last 24 hours: Temperature 90.7 pulse 141 respirations 31 blood pressure 125/54  Physical Exam: General: Critically ill appearing  HEENT anicteric  Neck Trach in place  Pulm/lungs bilateral rhonchi, fio2 30%  CVS/Heart S1S2 tachycardic  Abdomen:  Soft, mild distension, bowel sounds present  Extremities: + dependent edema in upper arms  Neurologic: Awake, alert, agitated  Skin: No acute rashes  Access: Rt IJ PC       Basic Metabolic Panel:   Recent Labs Lab 05/04/15 0625 05/06/15 0917 05/08/15 0716  NA 133* 137 137  K 3.6 4.2 4.3  CL 91* 94* 91*  CO2 24 25 24   GLUCOSE 91 113* 142*  BUN 66* 58* 94*  CREATININE 10.29* 9.58* 12.32*  CALCIUM 8.9 10.2 8.9  MG  --  3.0*  --   PHOS 6.5* 8.4* 5.8*     CBC:  Recent Labs Lab 05/04/15 0625 05/06/15 0917 05/08/15 0716  WBC 5.4 9.8 9.2  NEUTROABS  --  7.6  --   HGB 7.9* 8.3* 8.2*  HCT 25.7* 28.1* 26.6*  MCV 93.5 95.6 92.7  PLT 126* 131* PENDING      Microbiology:  Recent Results (from the past 720 hour(s))  Culture, respiratory (NON-Expectorated)     Status: None   Collection Time: 04/22/15  3:20 PM  Result Value Ref Range Status   Specimen Description TRACHEAL ASPIRATE  Final   Special Requests NONE  Final   Gram Stain   Final    ABUNDANT WBC PRESENT,BOTH PMN AND MONONUCLEAR RARE SQUAMOUS EPITHELIAL CELLS PRESENT FEW GRAM POSITIVE RODS Performed at [facility]    Culture   Final    ABUNDANT DIPHTHEROIDS(CORYNEBACTERIUM SPECIES) Note: Standardized susceptibility testing for this organism is not available. Performed at [facility]    Report Status 04/24/2015 FINAL  Final  Culture, blood (routine x 2)     Status: None   Collection Time: 04/24/15  9:47 AM  Result Value Ref Range Status   Specimen Description BLOOD CENTRAL LINE   Final   Special Requests BOTTLES DRAWN AEROBIC AND ANAEROBIC 5CC  Final   Culture NO GROWTH 5 DAYS  Final   Report Status 04/29/2015 FINAL  Final  Culture, blood (routine x 2)     Status: None   Collection Time: 04/24/15 11:20 AM  Result Value Ref Range Status   Specimen Description BLOOD RIGHT ANTECUBITAL  Final   Special Requests BOTTLES DRAWN AEROBIC AND ANAEROBIC 10CC  Final   Culture NO GROWTH 5 DAYS  Final   Report Status 04/29/2015 FINAL  Final    Coagulation Studies: No results for input(s): LABPROT, INR in the last 72 hours.  Urinalysis: No results for input(s): COLORURINE, LABSPEC, PHURINE, GLUCOSEU, HGBUR, BILIRUBINUR, KETONESUR, PROTEINUR, UROBILINOGEN, NITRITE, LEUKOCYTESUR in the last 72 hours.  Invalid input(s): APPERANCEUR    Imaging: No results found.   Medications:    ARANESP 100 Q WED   Assessment/ Plan:  55 y.o. male  with a PMHX of ESRD, hypertension, congestive heart failure, was admitted on 04/15/2015  He normally dialyzes at the [facility]  1. ESRD 2. Acute respiratory failure 3. Anemia of chronic kidney disease-   4. Secondary hyperparathyroidism 5. Thrombocytopenia, HIT POSITIVE- use citrate in catheter. 6. Hypokalemia 7. S/p PEA arrest 05/06/15  Plan: Patient seen and evaluated during hemodialysis.  He appears to be tolerating this well at the moment. Potassium 4.3 this a.m. And acceptable. Ultrafiltration target of 1.5 kg today. We will complete dialysis today and plan for dialysis again on Monday. Phosphorus down to 5.8. We will also continue to monitor this. Overall prognosis remains quite guarded given recent PEA arrest as well as underlying respiratory failure.    LOS:  Gary Rivera 2/17/20178:33 AM

## 2015-05-10 LAB — BASIC METABOLIC PANEL
Anion gap: 19 — ABNORMAL HIGH (ref 5–15)
BUN: 75 mg/dL — AB (ref 6–20)
CHLORIDE: 91 mmol/L — AB (ref 101–111)
CO2: 25 mmol/L (ref 22–32)
CREATININE: 11.08 mg/dL — AB (ref 0.61–1.24)
Calcium: 8.9 mg/dL (ref 8.9–10.3)
GFR calc Af Amer: 5 mL/min — ABNORMAL LOW (ref >60)
GFR calc non Af Amer: 5 mL/min — ABNORMAL LOW (ref >60)
Glucose, Bld: 104 mg/dL — ABNORMAL HIGH (ref 65–99)
Potassium: 5.1 mmol/L (ref 3.5–5.1)
Sodium: 135 mmol/L (ref 135–145)

## 2015-05-10 LAB — CBC
HEMATOCRIT: 26.3 % — AB (ref 39.0–52.0)
HEMATOCRIT: 28.1 % — AB (ref 39.0–52.0)
HEMOGLOBIN: 8 g/dL — AB (ref 13.0–17.0)
Hemoglobin: 7.9 g/dL — ABNORMAL LOW (ref 13.0–17.0)
MCH: 27.8 pg (ref 26.0–34.0)
MCH: 27.1 pg (ref 26.0–34.0)
MCHC: 30 g/dL (ref 30.0–36.0)
MCHC: 28.5 g/dL — AB (ref 30.0–36.0)
MCV: 92.6 fL (ref 78.0–100.0)
MCV: 95.3 fL (ref 78.0–100.0)
Platelets: 160 10*3/uL (ref 150–400)
Platelets: 184 10*3/uL (ref 150–400)
RBC: 2.84 MIL/uL — AB (ref 4.22–5.81)
RBC: 2.95 MIL/uL — ABNORMAL LOW (ref 4.22–5.81)
RDW: 19.1 % — ABNORMAL HIGH (ref 11.5–15.5)
RDW: 19.1 % — ABNORMAL HIGH (ref 11.5–15.5)
WBC: 8 10*3/uL (ref 4.0–10.5)
WBC: 7.4 10*3/uL (ref 4.0–10.5)

## 2015-05-11 ENCOUNTER — Other Ambulatory Visit (HOSPITAL_COMMUNITY): Payer: Self-pay

## 2015-05-11 LAB — RENAL FUNCTION PANEL
ALBUMIN: 3.1 g/dL — AB (ref 3.5–5.0)
ANION GAP: 20 — AB (ref 5–15)
BUN: 96 mg/dL — ABNORMAL HIGH (ref 6–20)
CO2: 22 mmol/L (ref 22–32)
Calcium: 8.8 mg/dL — ABNORMAL LOW (ref 8.9–10.3)
Chloride: 93 mmol/L — ABNORMAL LOW (ref 101–111)
Creatinine, Ser: 12.64 mg/dL — ABNORMAL HIGH (ref 0.61–1.24)
GFR calc non Af Amer: 4 mL/min — ABNORMAL LOW (ref >60)
GFR, EST AFRICAN AMERICAN: 4 mL/min — AB (ref >60)
GLUCOSE: 110 mg/dL — AB (ref 65–99)
PHOSPHORUS: 6.8 mg/dL — AB (ref 2.5–4.6)
Potassium: 5.1 mmol/L (ref 3.5–5.1)
SODIUM: 135 mmol/L (ref 135–145)

## 2015-05-11 LAB — VITAMIN B12: Vitamin B-12: 1329 pg/mL — ABNORMAL HIGH (ref 180–914)

## 2015-05-11 LAB — CBC
HCT: 26.9 % — ABNORMAL LOW (ref 39.0–52.0)
HEMOGLOBIN: 8.2 g/dL — AB (ref 13.0–17.0)
MCH: 28.3 pg (ref 26.0–34.0)
MCHC: 30.5 g/dL (ref 30.0–36.0)
MCV: 92.8 fL (ref 78.0–100.0)
PLATELETS: UNDETERMINED 10*3/uL (ref 150–400)
RBC: 2.9 MIL/uL — AB (ref 4.22–5.81)
RDW: 19.2 % — ABNORMAL HIGH (ref 11.5–15.5)
WBC: 6 10*3/uL (ref 4.0–10.5)

## 2015-05-11 NOTE — Progress Notes (Signed)
Subjective:  Patient seen during HD. Tolerating well. BFR currently 400. Slightly tachycardic at the moment.     Objective:  Vital signs in last 24 hours: Temperature 98.1 pulse 114 respirations 26 blood pressure 119/69 pulse ox 96%  Physical Exam: General: Critically ill appearing  HEENT anicteric  Neck Trach in place  Pulm/lungs bilateral rhonchi, fio2 35%  CVS/Heart S1S2 tachycardic  Abdomen:  Soft, mild distension, bowel sounds present  Extremities: + dependent edema in upper arms  Neurologic: Awake, alert, agitated  Skin: No acute rashes  Access: Rt IJ PC       Basic Metabolic Panel:   Recent Labs Lab 05/06/15 0917 05/08/15 0716 05/10/15 1006 05/11/15 0759  NA 137 137 135 135  K 4.2 4.3 5.1 5.1  CL 94* 91* 91* 93*  CO2 GLUCOSE 113* 142* 104* 110*  BUN 58* 94* 75* 96*  CREATININE 9.58* 12.32* 11.08* 12.64*  CALCIUM 10.2 8.9 8.9 8.8*  MG 3.0*  --   --   --   PHOS 8.4* 5.8*  --  6.8*     CBC:  Recent Labs Lab 05/06/15 0917 05/08/15 0716 05/10/15 0605 05/10/15 1006 05/11/15 0759  WBC 9.8 9.2 8.0 7.4 6.0  NEUTROABS 7.6  --   --   --   --   HGB 8.3* 8.2* 7.9* 8.0* 8.2*  HCT 28.1* 26.6* 26.3* 28.1* 26.9*  MCV 95.6 92.7 92.6 95.3 92.8  PLT 131* 136* 160 184 PLATELET CLUMPS NOTED ON SMEAR, UNABLE TO ESTIMATE      Microbiology:  Recent Results (from the past 720 hour(s))  Culture, respiratory (NON-Expectorated)     Status: None   Collection Time: 04/22/15  3:20 PM  Result Value Ref Range Status   Specimen Description TRACHEAL ASPIRATE  Final   Special Requests NONE  Final   Gram Stain   Final    ABUNDANT WBC PRESENT,BOTH PMN AND MONONUCLEAR RARE SQUAMOUS EPITHELIAL CELLS PRESENT FEW GRAM POSITIVE RODS Performed at [facility]    Culture   Final    ABUNDANT DIPHTHEROIDS(CORYNEBACTERIUM SPECIES) Note: Standardized susceptibility testing for this organism is not available. Performed at [facility]    Report  Status 04/24/2015 FINAL  Final  Culture, blood (routine x 2)     Status: None   Collection Time: 04/24/15  9:47 AM  Result Value Ref Range Status   Specimen Description BLOOD CENTRAL LINE  Final   Special Requests BOTTLES DRAWN AEROBIC AND ANAEROBIC 5CC  Final   Culture NO GROWTH 5 DAYS  Final   Report Status 04/29/2015 FINAL  Final  Culture, blood (routine x 2)     Status: None   Collection Time: 04/24/15 11:20 AM  Result Value Ref Range Status   Specimen Description BLOOD RIGHT ANTECUBITAL  Final   Special Requests BOTTLES DRAWN AEROBIC AND ANAEROBIC 10CC  Final   Culture NO GROWTH 5 DAYS  Final   Report Status 04/29/2015 FINAL  Final    Coagulation Studies: No results for input(s): LABPROT, INR in the last 72 hours.  Urinalysis: No results for input(s): COLORURINE, LABSPEC, PHURINE, GLUCOSEU, HGBUR, BILIRUBINUR, KETONESUR, PROTEINUR, UROBILINOGEN, NITRITE, LEUKOCYTESUR in the last 72 hours.  Invalid input(s): APPERANCEUR    Imaging: No results found.   Medications:    ARANESP 100 Q WED   Assessment/ Plan:  55 y.o. male  with a PMHX of ESRD, hypertension, congestive heart failure, was admitted on 04/15/2015  He normally dialyzes at the [facility] [place] [facility]  1. ESRD 2. Acute respiratory failure 3. Anemia of chronic kidney disease-   4. Secondary hyperparathyroidism 5. Thrombocytopenia, HIT POSITIVE- use citrate in catheter. 6. Hypokalemia 7. S/p PEA arrest 05/06/15  Plan: Patient seen and evaluated during hemodialysis treatment. Ultrafiltration rate adjusted down to 1.5 kg given tachycardia. We plan to complete hemodialysis treatment today. He remains on the ventilator at this point in time. Pulmonary critical care following this. Phosphorus high at 6.8. If this remains high on Wednesday we will consider adding liquid PhosLo. Hemoglobin rising at 8.2 therefore we will continue current dosage of Aranesp.   LOS:  Gerado Nabers,  Daisean Brodhead 2/20/20173:09 PM

## 2015-05-11 NOTE — Progress Notes (Signed)
Name: Gary Rivera MRN: [id number] DOB: 1960/12/26    ADMISSION DATE:  04/15/2015 CONSULTATION DATE:  04/16/15  REFERRING MD :  Dr. Sharyon Medicus   CHIEF COMPLAINT:  Tracheostomy dependent respiratory failure    DISCUSSION:   55 yo male admitted to [facility] from 03/29/15 to 04/15/15 with acute on chronic hypoxic/hypercapnic respiratory failure and pulmonary edema.  He eventually required tracheostomy.  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  He has hx of ESRD on HD, CHF, anemia, anxiety.     SIGNIFICANT EVENTS  1/05 Echo >> EF 35 to 40%, grade 1 diastolic dysfx, PAS 41 mmHg ..............Marland Kitchen 1/8-1/25  Admit to [facility] for acute on chronic hypoxemic / hypercapnic respiratory failure, s/p trach 1/20 [facility] - Dr Pollyann Kennedy ENT 1/25  Tx to [facility] for vent weaning  1/26  PCCM consulted for evaluation  2/15 brady -PEA arrest on ATC x 24h   SUBJECTIVE/OVERNIGHT/INTERVAL HX Has had 2 - 3 episodes of bradycardia and transient altered mental status that immediately resolves after a few seconds.  Per RT, similar pattern to when he arrested last week.  He has change in tongue color to ash white, then suddenly gets bradycardic and is "in a gaze like state".  Symptoms then immediately resolve and pt is in his USOH and interacts appropriately etc.  VITAL SIGNS: 97.6, 82, 18, 115/65, 100%.  PHYSICAL EXAMINATION: General: Chronically ill-appearing, obese, deconditioned. HEENT: trach site - small ulcer - non bleeding.  Cardiac: irregular, no M/R/G. Chest: clear bilaterally. Abd: soft, non tender. Ext: 1+ edema. Neuro: follows basic commands, MAE's.  PULMONARY  Recent Labs Lab 05/06/15 0727  PHART 7.397  PCO2ART 41.2  PO2ART 124*  HCO3 24.9*  TCO2 26.1  O2SAT 99.2    CBC  Recent Labs Lab 05/10/15 0605 05/10/15 1006 05/11/15 0759  HGB 7.9* 8.0* 8.2*  HCT 26.3* 28.1* 26.9*  WBC 8.0 7.4 6.0  PLT 160 184 PLATELET CLUMPS NOTED ON SMEAR,  UNABLE TO ESTIMATE    COAGULATION No results for input(s): INR in the last 168 hours.  CARDIAC    Recent Labs Lab 05/06/15 0917 05/07/15 0645 05/07/15 1545  TROPONINI 0.10* 0.37* 0.27*   No results for input(s): PROBNP in the last 168 hours.   CHEMISTRY  Recent Labs Lab 05/06/15 0917 05/08/15 0716 05/10/15 1006 05/11/15 0759  NA 137 137 135 135  K 4.2 4.3 5.1 5.1  CL 94* 91* 91* 93*  CO2 25 24 25 22   GLUCOSE 113* 142* 104* 110*  BUN 58* 94* 75* 96*  CREATININE 9.58* 12.32* 11.08* 12.64*  CALCIUM 10.2 8.9 8.9 8.8*  MG 3.0*  --   --   --   PHOS 8.4* 5.8*  --  6.8*   CrCl cannot be calculated (Unknown ideal weight.).   LIVER  Recent Labs Lab 05/06/15 0917 05/08/15 0716 05/11/15 0759  ALBUMIN 3.0* 3.1* 3.1*     INFECTIOUS  Recent Labs Lab 05/06/15 0916  LATICACIDVEN 3.0*     ENDOCRINE CBG (last 3)  No results for input(s): GLUCAP in the last 72 hours.       IMAGING x48h  - image(s) personally visualized  -   highlighted in bold No results found.     ASSESSMENT / PLAN:  PEA arrest 2/15 - unclear etiology, primary cardiac versus respiratory arrest.  Favor cardiac given his hx of bradycardia prior to altered mental status, etc.  Since then, he has had 2 - 3 instances of similar  episodes; however, has not arrested. Plan: Cardiology consult for what appears to be symptomatic bradycardia.  Acute on chronic hypoxic/hypercapnic respiratory failure. Failure to wean from ventilator s/p tracheostomy. Chronic elevation of right hemidiaphragm -unclear if this is playing a role in his respiratory failure Presumed OSA/OHS. Plan: Continue vent support and wean per usual protocol. Given OHS presumption and obesity and ESRD doubt good decannulation candidate. Follow CXR.  Combined CHF - EF 35%. ESRD on HD. Pulmonary edema. Plan: Per primary team and nephrology Central line per primary team   Rutherford Guys, PA - [facility] Pager: [phone]  or [phone] 05/11/2015, 11:32 AM   Attending Note:  I have examined patient, reviewed labs, studies and notes. I have discussed the case with Ihor Dow, and I agree with the data and plans as amended above.   Levy Pupa, MD, PhD 05/11/2015, 12:45 PM [facility] [phone] or if no answer [phone]

## 2015-05-12 LAB — FOLATE RBC
Folate, Hemolysate: >620 ng/mL
Folate, RBC: >2138 ng/mL (ref >498)
Hematocrit: 29 % — ABNORMAL LOW (ref 37.5–51.0)

## 2015-05-13 LAB — RENAL FUNCTION PANEL
ANION GAP: 16 — AB (ref 5–15)
Albumin: 3.1 g/dL — ABNORMAL LOW (ref 3.5–5.0)
BUN: 72 mg/dL — ABNORMAL HIGH (ref 6–20)
CALCIUM: 8.9 mg/dL (ref 8.9–10.3)
CHLORIDE: 93 mmol/L — AB (ref 101–111)
CO2: 23 mmol/L (ref 22–32)
CREATININE: 9.83 mg/dL — AB (ref 0.61–1.24)
GFR, EST AFRICAN AMERICAN: 6 mL/min — AB (ref >60)
GFR, EST NON AFRICAN AMERICAN: 5 mL/min — AB (ref >60)
Glucose, Bld: 100 mg/dL — ABNORMAL HIGH (ref 65–99)
Phosphorus: 4.2 mg/dL (ref 2.5–4.6)
Potassium: 4.3 mmol/L (ref 3.5–5.1)
Sodium: 132 mmol/L — ABNORMAL LOW (ref 135–145)

## 2015-05-13 LAB — CBC
HCT: 28.4 % — ABNORMAL LOW (ref 39.0–52.0)
HEMOGLOBIN: 8.4 g/dL — AB (ref 13.0–17.0)
MCH: 28.9 pg (ref 26.0–34.0)
MCHC: 29.6 g/dL — ABNORMAL LOW (ref 30.0–36.0)
MCV: 97.6 fL (ref 78.0–100.0)
PLATELETS: 162 10*3/uL (ref 150–400)
RBC: 2.91 MIL/uL — AB (ref 4.22–5.81)
RDW: 20.4 % — ABNORMAL HIGH (ref 11.5–15.5)
WBC: 6 10*3/uL (ref 4.0–10.5)

## 2015-05-13 NOTE — Progress Notes (Signed)
Subjective:  Patient seen and evaluated during hemodialysis. Muscle recurrently 400. Ultrafiltration target 1.5 kg. Patient awake and alert and follows commands.     Objective:  Vital signs in last 24 hours: Temperature 98.1 pulse 89 respirations 20 blood pressure 125/64  Physical Exam: General: Critically ill appearing  HEENT anicteric  Neck Trach in place  Pulm/lungs bilateral rhonchi, fio2 35%  CVS/Heart S1S2 no rubs  Abdomen:  Soft, mild distension, bowel sounds present  Extremities: + dependent edema in upper arms  Neurologic: Awake, alert, does follow simple commands  Skin: No acute rashes  Access: Rt IJ PC       Basic Metabolic Panel:   Recent Labs Lab 05/08/15 0716 05/10/15 1006 05/11/15 0759 05/13/15 0851  NA 137 135 135 132*  K 4.3 5.1 5.1 4.3  CL 91* 91* 93* 93*  CO2 24 25 22 23   GLUCOSE 142* 104* 110* 100*  BUN 94* 75* 96* 72*  CREATININE 12.32* 11.08* 12.64* 9.83*  CALCIUM 8.9 8.9 8.8* 8.9  PHOS 5.8*  --  6.8* 4.2     CBC:  Recent Labs Lab 05/08/15 0716 05/10/15 0605 05/10/15 1006 05/11/15 0759 05/11/15 1646 05/13/15 0851  WBC 9.2 8.0 7.4 6.0  --  6.0  HGB 8.2* 7.9* 8.0* 8.2*  --  8.4*  HCT 26.6* 26.3* 28.1* 26.9* 29.0* 28.4*  MCV 92.7 92.6 95.3 92.8  --  97.6  PLT 136* 160 184 PLATELET CLUMPS NOTED ON SMEAR, UNABLE TO ESTIMATE  --  162      Microbiology:  Recent Results (from the past 720 hour(s))  Culture, respiratory (NON-Expectorated)     Status: None   Collection Time: 04/22/15  3:20 PM  Result Value Ref Range Status   Specimen Description TRACHEAL ASPIRATE  Final   Special Requests NONE  Final   Gram Stain   Final    ABUNDANT WBC PRESENT,BOTH PMN AND MONONUCLEAR RARE SQUAMOUS EPITHELIAL CELLS PRESENT FEW GRAM POSITIVE RODS Performed at [facility]    Culture   Final    ABUNDANT DIPHTHEROIDS(CORYNEBACTERIUM SPECIES) Note: Standardized susceptibility testing for this organism is not available. Performed at  [facility]    Report Status 04/24/2015 FINAL  Final  Culture, blood (routine x 2)     Status: None   Collection Time: 04/24/15  9:47 AM  Result Value Ref Range Status   Specimen Description BLOOD CENTRAL LINE  Final   Special Requests BOTTLES DRAWN AEROBIC AND ANAEROBIC 5CC  Final   Culture NO GROWTH 5 DAYS  Final   Report Status 04/29/2015 FINAL  Final  Culture, blood (routine x 2)     Status: None   Collection Time: 04/24/15 11:20 AM  Result Value Ref Range Status   Specimen Description BLOOD RIGHT ANTECUBITAL  Final   Special Requests BOTTLES DRAWN AEROBIC AND ANAEROBIC 10CC  Final   Culture NO GROWTH 5 DAYS  Final   Report Status 04/29/2015 FINAL  Final    Coagulation Studies: No results for input(s): LABPROT, INR in the last 72 hours.  Urinalysis: No results for input(s): COLORURINE, LABSPEC, PHURINE, GLUCOSEU, HGBUR, BILIRUBINUR, KETONESUR, PROTEINUR, UROBILINOGEN, NITRITE, LEUKOCYTESUR in the last 72 hours.  Invalid input(s): APPERANCEUR    Imaging: Dg Chest Port 1 View  05/11/2015  CLINICAL DATA:  Respiratory failure.  Dialysis patient. EXAM: PORTABLE CHEST 1 VIEW COMPARISON:  05/06/2015 FINDINGS: The heart is enlarged but stable. The dialysis catheter is stable. The left IJ catheter has been removed. The tracheostomy tube is stable.  Persistent perihilar vascular congestion and probable perihilar edema. No pleural effusion. Stable eventration of the right hemidiaphragm. IMPRESSION: Stable cardiac enlargement, central vascular congestion and probable perihilar pulmonary edema. No definite pleural effusions. Stable dialysis catheter and tracheostomy tube. Electronically Signed   By: P.  GalleraniRudie Meyer: 05/11/2015 19:28     Medications:    ARANESP 100 Q WED   Assessment/ Plan:  55 y.o. male  with a PMHX of ESRD, hypertension, congestive heart failure, was admitted on 04/15/2015  He normally dialyzes at the [facility] [facility]  1. ESRD 2. Acute respiratory failure 3. Anemia of chronic kidney disease-   4. Secondary hyperparathyroidism 5. Thrombocytopenia, HIT POSITIVE- use citrate in catheter. 6. Hypokalemia 7. S/p PEA arrest 05/06/15  Plan: Patient seen and evaluated during hemodialysis treatment. Blood flow rate currently 400 with ultrafiltration target of 1.5 kg. He continues to tolerate dialysis well. We will continue Aranesp for anemia of chronic kidney disease. He remains on the ventilator at this point in time and has been rather difficult to wean over the past several days. We will continue dialysis on a Monday, Wednesday, Friday schedule.   LOS:  Diyari Cherne 2/22/20174:34 PM

## 2015-05-14 DIAGNOSIS — Z4659 Encounter for fitting and adjustment of other gastrointestinal appliance and device: Secondary | ICD-10-CM

## 2015-05-14 DIAGNOSIS — R1310 Dysphagia, unspecified: Secondary | ICD-10-CM | POA: Insufficient documentation

## 2015-05-14 NOTE — Progress Notes (Signed)
Name: Gary Rivera MRN: [id number] DOB: Jun 02, 1960    ADMISSION DATE:  04/15/2015 CONSULTATION DATE:  04/16/15  REFERRING MD :  Dr. Sharyon Medicus   CHIEF COMPLAINT:  Tracheostomy dependent respiratory failure    DISCUSSION:   55 yo male admitted to [facility] from 03/29/15 to 04/15/15 with acute on chronic hypoxic/hypercapnic respiratory failure and pulmonary edema.  He eventually required tracheostomy.  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  He has hx of ESRD on HD, CHF, anemia, anxiety.     SIGNIFICANT EVENTS  1/05 Echo >> EF 35 to 40%, grade 1 diastolic dysfx, PAS 41 mmHg ..............Marland Kitchen 1/8-1/25  Admit to [facility] for acute on chronic hypoxemic / hypercapnic respiratory failure, s/p trach 1/20 [facility] - Dr Pollyann Kennedy ENT 1/25  Tx to [facility] for vent weaning  1/26  PCCM consulted for evaluation  2/15 brady -PEA arrest on ATC x 24h   SUBJECTIVE/OVERNIGHT/INTERVAL HX Awake on trach collar  VITAL SIGNS: 97 80 20 106/59 96%  PHYSICAL EXAMINATION: General: Chronically ill-appearing, obese, on TC awake HEENT: trach site clean Cardiac: irregular, no M/R/G. Chest: distant lcear Abd: soft, non tender. Ext: 1+ edema. Neuro: follows basic commands, MAE's.  PULMONARY No results for input(s): PHART, PCO2ART, PO2ART, HCO3, TCO2, O2SAT in the last 168 hours.  Invalid input(s): PCO2, PO2  CBC  Recent Labs Lab 05/10/15 1006 05/11/15 0759 05/11/15 1646 05/13/15 0851  HGB 8.0* 8.2*  --  8.4*  HCT 28.1* 26.9* 29.0* 28.4*  WBC 7.4 6.0  --  6.0  PLT 184 PLATELET CLUMPS NOTED ON SMEAR, UNABLE TO ESTIMATE  --  162    COAGULATION No results for input(s): INR in the last 168 hours.  CARDIAC    Recent Labs Lab 05/07/15 1545  TROPONINI 0.27*   No results for input(s): PROBNP in the last 168 hours.   CHEMISTRY  Recent Labs Lab 05/08/15 0716 05/10/15 1006 05/11/15 0759 05/13/15 0851  NA 137 135 135 132*  K 4.3 5.1 5.1 4.3    CL 91* 91* 93* 93*  CO2 24 25 22 23   GLUCOSE 142* 104* 110* 100*  BUN 94* 75* 96* 72*  CREATININE 12.32* 11.08* 12.64* 9.83*  CALCIUM 8.9 8.9 8.8* 8.9  PHOS 5.8*  --  6.8* 4.2   CrCl cannot be calculated (Unknown ideal weight.).   LIVER  Recent Labs Lab 05/08/15 0716 05/11/15 0759 05/13/15 0851  ALBUMIN 3.1* 3.1* 3.1*     INFECTIOUS No results for input(s): LATICACIDVEN, PROCALCITON in the last 168 hours.   ENDOCRINE CBG (last 3)  No results for input(s): GLUCAP in the last 72 hours.       IMAGING x48h  - image(s) personally visualized  -   highlighted in bold No results found.     ASSESSMENT / PLAN:  PEA arrest 2/15 - unclear etiology, primary cardiac versus respiratory arrest.  Favor cardiac given his hx of bradycardia prior to altered mental status, etc.  Since then, he has had 2 - 3 instances of similar episodes; however, has not arrested. Plan: Tele, maintain Slow weaning HR now fast, on metoprolol Per primary  Acute on chronic hypoxic/hypercapnic respiratory failure. Failure to wean from ventilator s/p tracheostomy. Chronic elevation of right hemidiaphragm -unclear if this is playing a role in his respiratory failure Presumed OSA/OHS. Plan: Trach collar goal 8 hrs 35% If further brady will need nocturnal vent Neg balance goals on hd pmv when to 6, doesn't do well with 8  Lavon Paganini. Titus Mould, MD, [place] Pgr: [facility]

## 2015-05-15 LAB — CBC WITH DIFFERENTIAL/PLATELET
BASOS ABS: 0 10*3/uL (ref 0.0–0.1)
BASOS PCT: 0 %
EOS ABS: 0.1 10*3/uL (ref 0.0–0.7)
Eosinophils Relative: 2 %
HCT: 26.8 % — ABNORMAL LOW (ref 39.0–52.0)
HEMOGLOBIN: 8 g/dL — AB (ref 13.0–17.0)
LYMPHS ABS: 1.2 10*3/uL (ref 0.7–4.0)
LYMPHS PCT: 18 %
MCH: 28.3 pg (ref 26.0–34.0)
MCHC: 29.9 g/dL — AB (ref 30.0–36.0)
MCV: 94.7 fL (ref 78.0–100.0)
MONO ABS: 0.6 10*3/uL (ref 0.1–1.0)
Monocytes Relative: 9 %
NEUTROS ABS: 4.7 10*3/uL (ref 1.7–7.7)
Neutrophils Relative %: 71 %
PLATELETS: 170 10*3/uL (ref 150–400)
RBC: 2.83 MIL/uL — ABNORMAL LOW (ref 4.22–5.81)
RDW: 20.8 % — AB (ref 11.5–15.5)
WBC: 6.6 10*3/uL (ref 4.0–10.5)

## 2015-05-15 LAB — RENAL FUNCTION PANEL
ALBUMIN: 3 g/dL — AB (ref 3.5–5.0)
ANION GAP: 12 (ref 5–15)
BUN: 58 mg/dL — AB (ref 6–20)
CALCIUM: 8.9 mg/dL (ref 8.9–10.3)
CO2: 26 mmol/L (ref 22–32)
Chloride: 95 mmol/L — ABNORMAL LOW (ref 101–111)
Creatinine, Ser: 8.14 mg/dL — ABNORMAL HIGH (ref 0.61–1.24)
GFR calc Af Amer: 8 mL/min — ABNORMAL LOW (ref >60)
GFR calc non Af Amer: 7 mL/min — ABNORMAL LOW (ref >60)
GLUCOSE: 98 mg/dL (ref 65–99)
PHOSPHORUS: 3.9 mg/dL (ref 2.5–4.6)
Potassium: 4.3 mmol/L (ref 3.5–5.1)
SODIUM: 133 mmol/L — AB (ref 135–145)

## 2015-05-15 NOTE — Progress Notes (Signed)
Subjective:  Patient seen at bedside. Just came off the ventilator. On T piece.  Resting comfortably in bed     Objective:  Vital signs in last 24 hours: Temperature 97.8 pulse 86 respirations 24 blood pressure 98/59 pulse ox 100%  Physical Exam: General: Critically ill appearing  HEENT anicteric  Neck Trach in place  Pulm/lungs bilateral rhonchi, normal effort  CVS/Heart S1S2 tachycardic  Abdomen:  Soft, mild distension, bowel sounds present  Extremities: + dependent edema in upper arms  Neurologic: Awake, alert, does follow simple commands  Skin: No acute rashes  Access: Rt IJ PC       Basic Metabolic Panel:   Recent Labs Lab 05/10/15 1006 05/11/15 0759 05/13/15 0851 05/15/15 0625  NA 135 135 132* 133*  K 5.1 5.1 4.3 4.3  CL 91* 93* 93* 95*  CO2 25 22 23 26   GLUCOSE 104* 110* 100* 98  BUN 75* 96* 72* 58*  CREATININE 11.08* 12.64* 9.83* 8.14*  CALCIUM 8.9 8.8* 8.9 8.9  PHOS  --  6.8* 4.2 3.9     CBC:  Recent Labs Lab 05/10/15 0605 05/10/15 1006 05/11/15 0759 05/11/15 1646 05/13/15 0851 05/15/15 0613  WBC 8.0 7.4 6.0  --  6.0 PENDING  NEUTROABS  --   --   --   --   --  PENDING  HGB 7.9* 8.0* 8.2*  --  8.4* 8.0*  HCT 26.3* 28.1* 26.9* 29.0* 28.4* 26.8*  MCV 92.6 95.3 92.8  --  97.6 94.7  PLT 160 184 PLATELET CLUMPS NOTED ON SMEAR, UNABLE TO ESTIMATE  --  162 PENDING      Microbiology:  Recent Results (from the past 720 hour(s))  Culture, respiratory (NON-Expectorated)     Status: None   Collection Time: 04/22/15  3:20 PM  Result Value Ref Range Status   Specimen Description TRACHEAL ASPIRATE  Final   Special Requests NONE  Final   Gram Stain   Final    ABUNDANT WBC PRESENT,BOTH PMN AND MONONUCLEAR RARE SQUAMOUS EPITHELIAL CELLS PRESENT FEW GRAM POSITIVE RODS Performed at [facility]    Culture   Final    ABUNDANT DIPHTHEROIDS(CORYNEBACTERIUM SPECIES) Note: Standardized susceptibility testing for this organism is not  available. Performed at [facility]    Report Status 04/24/2015 FINAL  Final  Culture, blood (routine x 2)     Status: None   Collection Time: 04/24/15  9:47 AM  Result Value Ref Range Status   Specimen Description BLOOD CENTRAL LINE  Final   Special Requests BOTTLES DRAWN AEROBIC AND ANAEROBIC 5CC  Final   Culture NO GROWTH 5 DAYS  Final   Report Status 04/29/2015 FINAL  Final  Culture, blood (routine x 2)     Status: None   Collection Time: 04/24/15 11:20 AM  Result Value Ref Range Status   Specimen Description BLOOD RIGHT ANTECUBITAL  Final   Special Requests BOTTLES DRAWN AEROBIC AND ANAEROBIC 10CC  Final   Culture NO GROWTH 5 DAYS  Final   Report Status 04/29/2015 FINAL  Final    Coagulation Studies: No results for input(s): LABPROT, INR in the last 72 hours.  Urinalysis: No results for input(s): COLORURINE, LABSPEC, PHURINE, GLUCOSEU, HGBUR, BILIRUBINUR, KETONESUR, PROTEINUR, UROBILINOGEN, NITRITE, LEUKOCYTESUR in the last 72 hours.  Invalid input(s): APPERANCEUR    Imaging: No results found.   Medications:    ARANESP 100 Q WED   Assessment/ Plan:  55 y.o. male  with a PMHX of ESRD, hypertension, congestive heart failure,  was admitted on 04/15/2015  He normally dialyzes at the [facility]  1. ESRD 2. Acute respiratory failure 3. Anemia of chronic kidney disease-   4. Secondary hyperparathyroidism 5. Thrombocytopenia, HIT POSITIVE- use citrate in catheter. 6. Hypokalemia 7. S/p PEA arrest 05/06/15  Plan: Patient due for hemodialysis today.  We will plan for dialysis on Monday as well. Orders have been prepared. In regards to acute respiratory failure patient just came off the ventilator and is on T. Piece this a.m. Hemoglobin currently 8.0. Patient to be continued on Aranesp. Phosphorus down to 3.9 and acceptable. Continue to monitor progress.   LOS:  Kaeleen Odom 2/24/20178:42 AM

## 2015-05-18 DIAGNOSIS — Z9911 Dependence on respirator [ventilator] status: Secondary | ICD-10-CM

## 2015-05-18 LAB — CBC
HEMATOCRIT: 26.2 % — AB (ref 39.0–52.0)
Hemoglobin: 8.1 g/dL — ABNORMAL LOW (ref 13.0–17.0)
MCH: 29.3 pg (ref 26.0–34.0)
MCHC: 30.9 g/dL (ref 30.0–36.0)
MCV: 94.9 fL (ref 78.0–100.0)
PLATELETS: 131 10*3/uL — AB (ref 150–400)
RBC: 2.76 MIL/uL — ABNORMAL LOW (ref 4.22–5.81)
RDW: 20 % — AB (ref 11.5–15.5)
WBC: 4.9 10*3/uL (ref 4.0–10.5)

## 2015-05-18 LAB — RENAL FUNCTION PANEL
ALBUMIN: 2.8 g/dL — AB (ref 3.5–5.0)
Anion gap: 16 — ABNORMAL HIGH (ref 5–15)
BUN: 104 mg/dL — AB (ref 6–20)
CO2: 24 mmol/L (ref 22–32)
CREATININE: 11.04 mg/dL — AB (ref 0.61–1.24)
Calcium: 8.7 mg/dL — ABNORMAL LOW (ref 8.9–10.3)
Chloride: 88 mmol/L — ABNORMAL LOW (ref 101–111)
GFR calc Af Amer: 5 mL/min — ABNORMAL LOW (ref >60)
GFR, EST NON AFRICAN AMERICAN: 5 mL/min — AB (ref >60)
GLUCOSE: 107 mg/dL — AB (ref 65–99)
PHOSPHORUS: 5.9 mg/dL — AB (ref 2.5–4.6)
POTASSIUM: 4.8 mmol/L (ref 3.5–5.1)
SODIUM: 128 mmol/L — AB (ref 135–145)

## 2015-05-18 NOTE — Progress Notes (Signed)
Subjective:  Patient seen during dialysis Tolerating well Still agitated some requiring wrist restraints Trying to write requests on paper but not legible enough  BFR 400    Objective:  Vital signs in last 24 hours: Temperature 97.8 pulse 86 respirations 25 blood pressure 150/91  Physical Exam: General: Critically ill appearing  HEENT anicteric  Neck Trach in place  Pulm/lungs bilateral rhonchi, Vent assisted  CVS/Heart S1S2 tachycardic, irregular  Abdomen:  Soft, mild distension,    Extremities: + dependent edema in upper arms  Neurologic: Awake, alert,    Skin: No acute rashes  Access: Rt IJ PC       Basic Metabolic Panel:   Recent Labs Lab 05/13/15 0851 05/15/15 0625 05/18/15 1322  NA 132* 133* 128*  K 4.3 4.3 4.8  CL 93* 95* 88*  CO2 23 26 24   GLUCOSE 100* 98 107*  BUN 72* 58* 104*  CREATININE 9.83* 8.14* 11.04*  CALCIUM 8.9 8.9 8.7*  PHOS 4.2 3.9 5.9*     CBC:  Recent Labs Lab 05/11/15 1646 05/13/15 0851 05/15/15 0613 05/18/15 1322  WBC  --  6.0 6.6 4.9  NEUTROABS  --   --  4.7  --   HGB  --  8.4* 8.0* 8.1*  HCT 29.0* 28.4* 26.8* 26.2*  MCV  --  97.6 94.7 94.9  PLT  --  162 170 131*      Microbiology:  Recent Results (from the past 720 hour(s))  Culture, respiratory (NON-Expectorated)     Status: None   Collection Time: 04/22/15  3:20 PM  Result Value Ref Range Status   Specimen Description TRACHEAL ASPIRATE  Final   Special Requests NONE  Final   Gram Stain   Final    ABUNDANT WBC PRESENT,BOTH PMN AND MONONUCLEAR RARE SQUAMOUS EPITHELIAL CELLS PRESENT FEW GRAM POSITIVE RODS Performed at [facility]    Culture   Final    ABUNDANT DIPHTHEROIDS(CORYNEBACTERIUM SPECIES) Note: Standardized susceptibility testing for this organism is not available. Performed at [facility]    Report Status 04/24/2015 FINAL  Final  Culture, blood (routine x 2)     Status: None   Collection Time: 04/24/15  9:47 AM  Result Value  Ref Range Status   Specimen Description BLOOD CENTRAL LINE  Final   Special Requests BOTTLES DRAWN AEROBIC AND ANAEROBIC 5CC  Final   Culture NO GROWTH 5 DAYS  Final   Report Status 04/29/2015 FINAL  Final  Culture, blood (routine x 2)     Status: None   Collection Time: 04/24/15 11:20 AM  Result Value Ref Range Status   Specimen Description BLOOD RIGHT ANTECUBITAL  Final   Special Requests BOTTLES DRAWN AEROBIC AND ANAEROBIC 10CC  Final   Culture NO GROWTH 5 DAYS  Final   Report Status 04/29/2015 FINAL  Final    Coagulation Studies: No results for input(s): LABPROT, INR in the last 72 hours.  Urinalysis: No results for input(s): COLORURINE, LABSPEC, PHURINE, GLUCOSEU, HGBUR, BILIRUBINUR, KETONESUR, PROTEINUR, UROBILINOGEN, NITRITE, LEUKOCYTESUR in the last 72 hours.  Invalid input(s): APPERANCEUR    Imaging: No results found.   Medications:    ARANESP 100 Q WED   Assessment/ Plan:  55 y.o. male  with a PMHX of ESRD, hypertension, congestive heart failure, was admitted on 04/15/2015  He normally dialyzes at the [facility] [facility]  1. ESRD 2. Acute respiratory failure 3. Anemia of chronic kidney disease-   4. Secondary hyperparathyroidism 5. Thrombocytopenia, HIT POSITIVE-  use citrate in catheter. 6. Hypokalemia 7. S/p PEA arrest 05/06/15  Plan: Patient seen during dialysis Tolerating well  Hemoglobin currently 8.1. Patient to be continued on Aranesp. Phosphorus down to 5.9  Start phosLo Continue to monitor progress.   LOS:  Mosetta Pigeon 2/27/20174:22 PM

## 2015-05-18 NOTE — Progress Notes (Signed)
   Name: Gary Rivera MRN: [id number] DOB: [birth year]    ADMISSION DATE:  04/15/2015 CONSULTATION DATE:  04/16/15  REFERRING MD :  Dr. Sharyon Medicus   CHIEF COMPLAINT:  Tracheostomy dependent respiratory failure    DISCUSSION:   55 yo male admitted to [facility] from 03/29/15 to 04/15/15 with acute on chronic hypoxic/hypercapnic respiratory failure and pulmonary edema.  He eventually required tracheostomy.  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  He has hx of ESRD on HD, CHF, anemia, anxiety.     SIGNIFICANT EVENTS  1/05 Echo >> EF 35 to 40%, grade 1 diastolic dysfx, PAS 41 mmHg ..............Marland Kitchen 1/8-1/25  Admit to [facility] for acute on chronic hypoxemic / hypercapnic respiratory failure, s/p trach 1/20 [facility] - Dr Pollyann Kennedy ENT 1/25  Tx to [facility] for vent weaning  1/26  PCCM consulted for evaluation  2/15 brady -PEA arrest on ATC x 24h 2/27 brady on TC trial  SUBJECTIVE/OVERNIGHT/INTERVAL HX Brady on TC trial to 20's, back on full support.  VITAL SIGNS: 97 80 20 106/59 96%  PHYSICAL EXAMINATION: General: Chronically ill-appearing, obese, on full support, withdraws to pain. HEENT: trach site clean, [place]/AT, PERRL, EOM-I and MMM Cardiac: IRIR, Nl S1/S2, -M/R/G. Chest: Coarse BS diffusely. Abd: soft, NT, ND and +BS. Ext: 1+ edema and -tenderness. Neuro: Withdraws all ext to pain.  PULMONARY No results for input(s): PHART, PCO2ART, PO2ART, HCO3, TCO2, O2SAT in the last 168 hours.  Invalid input(s): PCO2, PO2  CBC  Recent Labs Lab 05/11/15 1646 05/13/15 0851 05/15/15 0613  HGB  --  8.4* 8.0*  HCT 29.0* 28.4* 26.8*  WBC  --  6.0 6.6  PLT  --  162 170   COAGULATION No results for input(s): INR in the last 168 hours.  CARDIAC   No results for input(s): TROPONINI in the last 168 hours. No results for input(s): PROBNP in the last 168 hours.   CHEMISTRY  Recent Labs Lab 05/13/15 0851 05/15/15 0625  NA 132* 133*  K 4.3  4.3  CL 93* 95*  CO2 23 26  GLUCOSE 100* 98  BUN 72* 58*  CREATININE 9.83* 8.14*  CALCIUM 8.9 8.9  PHOS 4.2 3.9   CrCl cannot be calculated (Unknown ideal weight.).  LIVER  Recent Labs Lab 05/13/15 0851 05/15/15 0625  ALBUMIN 3.1* 3.0*   INFECTIOUS No results for input(s): LATICACIDVEN, PROCALCITON in the last 168 hours.   ENDOCRINE CBG (last 3)  No results for input(s): GLUCAP in the last 72 hours.  I reviewed CXR myself, trach in good position, chronic changes.   ASSESSMENT / PLAN:  PEA arrest 2/15 - unclear etiology, primary cardiac versus respiratory arrest.  Favor cardiac given his hx of bradycardia prior to altered mental status, etc.  Since then, he has had 2 - 3 instances of similar episodes; however, has not arrested. Plan: Tele, maintain Slow weaning given frequent bradycardia while weaning HR now improved on metoprolol  Acute on chronic hypoxic/hypercapnic respiratory failure. Failure to wean from ventilator s/p tracheostomy. Chronic elevation of right hemidiaphragm -unclear if this is playing a role in his respiratory failure Presumed OSA/OHS. Plan: Hold further weaning for today given this morning events. Titrate O2 for sat of 88-92%. If continues to have bradycardia will likely need vent SNF. Neg balance goals on hd  Discussed with RT bedside.  Alyson Reedy, M.D. [facility] Pulmonary/Critical Care Medicine. Pager: [phone]. After hours pager: [phone].

## 2015-05-19 LAB — HEPATITIS B SURFACE ANTIGEN: Hepatitis B Surface Ag: NEGATIVE

## 2015-05-20 LAB — RENAL FUNCTION PANEL
Albumin: 3.3 g/dL — ABNORMAL LOW (ref 3.5–5.0)
Anion gap: 17 — ABNORMAL HIGH (ref 5–15)
BUN: 73 mg/dL — ABNORMAL HIGH (ref 6–20)
CALCIUM: 9.4 mg/dL (ref 8.9–10.3)
CHLORIDE: 89 mmol/L — AB (ref 101–111)
CO2: 25 mmol/L (ref 22–32)
Creatinine, Ser: 9.43 mg/dL — ABNORMAL HIGH (ref 0.61–1.24)
GFR, EST AFRICAN AMERICAN: 6 mL/min — AB (ref >60)
GFR, EST NON AFRICAN AMERICAN: 5 mL/min — AB (ref >60)
GLUCOSE: 138 mg/dL — AB (ref 65–99)
POTASSIUM: 4.7 mmol/L (ref 3.5–5.1)
Phosphorus: 5.7 mg/dL — ABNORMAL HIGH (ref 2.5–4.6)
Sodium: 131 mmol/L — ABNORMAL LOW (ref 135–145)

## 2015-05-20 LAB — CBC
HEMATOCRIT: 30.8 % — AB (ref 39.0–52.0)
Hemoglobin: 9.1 g/dL — ABNORMAL LOW (ref 13.0–17.0)
MCH: 28.3 pg (ref 26.0–34.0)
MCHC: 29.5 g/dL — ABNORMAL LOW (ref 30.0–36.0)
MCV: 95.7 fL (ref 78.0–100.0)
PLATELETS: 168 10*3/uL (ref 150–400)
RBC: 3.22 MIL/uL — AB (ref 4.22–5.81)
RDW: 19.4 % — AB (ref 11.5–15.5)
WBC: 6.2 10*3/uL (ref 4.0–10.5)

## 2015-05-20 NOTE — Progress Notes (Signed)
  Subjective:  Patient seen during dialysis Tolerating well Refusing BP measurements  BFR 400    Objective:  Vital signs in last 24 hours: Temperature 97.0 pulse 110-120 respirations 24 blood pressure 184/96  Physical Exam: General: Critically ill appearing  HEENT anicteric  Neck Trach in place  Pulm/lungs bilateral rhonchi,   CVS/Heart S1S2 tachycardic, irregular  Abdomen:  Soft, mild distension,    Extremities: + dependent edema in upper arms  Neurologic: Awake, alert,    Skin: No acute rashes  Access: Rt IJ PC       Basic Metabolic Panel:   Recent Labs Lab 05/15/15 0625 05/18/15 1322  NA 133* 128*  K 4.3 4.8  CL 95* 88*  CO2 26 24  GLUCOSE 98 107*  BUN 58* 104*  CREATININE 8.14* 11.04*  CALCIUM 8.9 8.7*  PHOS 3.9 5.9*     CBC:  Recent Labs Lab 05/15/15 0613 05/18/15 1322  WBC 6.6 4.9  NEUTROABS 4.7  --   HGB 8.0* 8.1*  HCT 26.8* 26.2*  MCV 94.7 94.9  PLT 170 131*      Microbiology:  Recent Results (from the past 720 hour(s))  Culture, respiratory (NON-Expectorated)     Status: None   Collection Time: 04/22/15  3:20 PM  Result Value Ref Range Status   Specimen Description TRACHEAL ASPIRATE  Final   Special Requests NONE  Final   Gram Stain   Final    ABUNDANT WBC PRESENT,BOTH PMN AND MONONUCLEAR RARE SQUAMOUS EPITHELIAL CELLS PRESENT FEW GRAM POSITIVE RODS Performed at [facility]    Culture   Final    ABUNDANT DIPHTHEROIDS(CORYNEBACTERIUM SPECIES) Note: Standardized susceptibility testing for this organism is not available. Performed at [facility]    Report Status 04/24/2015 FINAL  Final  Culture, blood (routine x 2)     Status: None   Collection Time: 04/24/15  9:47 AM  Result Value Ref Range Status   Specimen Description BLOOD CENTRAL LINE  Final   Special Requests BOTTLES DRAWN AEROBIC AND ANAEROBIC 5CC  Final   Culture NO GROWTH 5 DAYS  Final   Report Status 04/29/2015 FINAL  Final  Culture, blood  (routine x 2)     Status: None   Collection Time: 04/24/15 11:20 AM  Result Value Ref Range Status   Specimen Description BLOOD RIGHT ANTECUBITAL  Final   Special Requests BOTTLES DRAWN AEROBIC AND ANAEROBIC 10CC  Final   Culture NO GROWTH 5 DAYS  Final   Report Status 04/29/2015 FINAL  Final    Coagulation Studies: No results for input(s): LABPROT, INR in the last 72 hours.  Urinalysis: No results for input(s): COLORURINE, LABSPEC, PHURINE, GLUCOSEU, HGBUR, BILIRUBINUR, KETONESUR, PROTEINUR, UROBILINOGEN, NITRITE, LEUKOCYTESUR in the last 72 hours.  Invalid input(s): APPERANCEUR    Imaging: No results found.   Medications:    ARANESP 100 Q WED   Assessment/ Plan:  55 y.o. male  with a PMHX of ESRD, hypertension, congestive heart failure, was admitted on 04/15/2015  He normally dialyzes at the [facility]  1. ESRD 2. Acute respiratory failure 3. Anemia of chronic kidney disease-   4. Secondary hyperparathyroidism 5. Thrombocytopenia, HIT POSITIVE- use citrate in catheter. 6. Hypokalemia 7. S/p PEA arrest 05/06/15  Plan: Patient seen during dialysis Tolerating well  Hemoglobin currently 8.1. Patient to be continued on Aranesp. Phosphorus down to 5.9  Start phosLo Continue to monitor progress.   LOS:  Stanford Strauch 3/1/20173:13 PM

## 2015-05-21 NOTE — Progress Notes (Signed)
Name: Gary Rivera MRN: [id number] DOB: 12/20/1960    ADMISSION DATE:  04/15/2015 CONSULTATION DATE:  04/16/15  REFERRING MD :  Dr. Sharyon Medicus   CHIEF COMPLAINT:  Tracheostomy dependent respiratory failure    DISCUSSION:   55 yo male admitted to [facility] from 03/29/15 to 04/15/15 with acute on chronic hypoxic/hypercapnic respiratory failure and pulmonary edema.  He eventually required tracheostomy.  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  He has hx of ESRD on HD, CHF, anemia, anxiety.     SIGNIFICANT EVENTS  1/05 Echo >> EF 35 to 40%, grade 1 diastolic dysfx, PAS 41 mmHg ..............Marland Kitchen 1/8-1/25  Admit to [facility] for acute on chronic hypoxemic / hypercapnic respiratory failure, s/p trach 1/20 [facility] - Dr Pollyann Kennedy ENT 1/25  Tx to [facility] for vent weaning  1/26  PCCM consulted for evaluation  2/15 brady -PEA arrest on ATC x 24h 2/27 brady on TC trial  SUBJECTIVE/OVERNIGHT/INTERVAL HX Brady on TC trial to 20's, back on full support.  VITAL SIGNS: 99 95 20 114/65 97%  PHYSICAL EXAMINATION: General: Chronically ill-appearing, obese, on full support, alert and interactive.Marland Kitchen HEENT: trach site clean, [place]/AT, PERRL, EOM-I and MMM Cardiac: IRIR, Nl S1/S2, -M/R/G. Chest: Coarse BS diffusely. Abd: soft, NT, ND and +BS. Ext: 1+ edema and -tenderness. Neuro: Alert and interactive, moving all ext to command, writing notes.  PULMONARY No results for input(s): PHART, PCO2ART, PO2ART, HCO3, TCO2, O2SAT in the last 168 hours.  Invalid input(s): PCO2, PO2  CBC  Recent Labs Lab 05/15/15 0613 05/18/15 1322 05/20/15 1451  HGB 8.0* 8.1* 9.1*  HCT 26.8* 26.2* 30.8*  WBC 6.6 4.9 6.2  PLT 170 131* 168   COAGULATION No results for input(s): INR in the last 168 hours.  CARDIAC   No results for input(s): TROPONINI in the last 168 hours. No results for input(s): PROBNP in the last 168 hours.  CHEMISTRY  Recent Labs Lab 05/15/15 0625  05/18/15 1322 05/20/15 1452  NA 133* 128* 131*  K 4.3 4.8 4.7  CL 95* 88* 89*  CO2 26 24 25   GLUCOSE 98 107* 138*  BUN 58* 104* 73*  CREATININE 8.14* 11.04* 9.43*  CALCIUM 8.9 8.7* 9.4  PHOS 3.9 5.9* 5.7*   CrCl cannot be calculated (Unknown ideal weight.).  LIVER  Recent Labs Lab 05/15/15 0625 05/18/15 1322 05/20/15 1452  ALBUMIN 3.0* 2.8* 3.3*   INFECTIOUS No results for input(s): LATICACIDVEN, PROCALCITON in the last 168 hours.  ENDOCRINE CBG (last 3)  No results for input(s): GLUCAP in the last 72 hours.  I reviewed CXR myself, trach in good position, chronic changes.   ASSESSMENT / PLAN:  PEA arrest 2/15 - unclear etiology, primary cardiac versus respiratory arrest.  Favor cardiac given his hx of bradycardia prior to altered mental status, etc.  Since then, he has had 2 - 3 instances of similar episodes; however, has not arrested. Plan: Tele, maintain Performed 16 hours to TC yesterday but only one hour today, increased HR and RR. Continue weaning as able, anticipate will be able to proceed to [place] but no decannulation with deconditioning and OSA. HR now improved on metoprolol  Acute on chronic hypoxic/hypercapnic respiratory failure. Failure to wean from ventilator s/p tracheostomy. Chronic elevation of right hemidiaphragm -unclear if this is playing a role in his respiratory failure Presumed OSA/OHS. Plan: Attempt weaning again later today to TC. Titrate O2 for sat of 88-92%. If continues to have bradycardia will likely need vent  SNF. Neg balance goals on hd  Discussed with RT bedside.  Alyson Reedy, M.D. [facility] Pulmonary/Critical Care Medicine. Pager: [phone]. After hours pager: [phone].

## 2015-05-22 LAB — CBC
HCT: 28.3 % — ABNORMAL LOW (ref 39.0–52.0)
HEMOGLOBIN: 8.4 g/dL — AB (ref 13.0–17.0)
MCH: 29.2 pg (ref 26.0–34.0)
MCHC: 29.7 g/dL — ABNORMAL LOW (ref 30.0–36.0)
MCV: 98.3 fL (ref 78.0–100.0)
PLATELETS: 134 10*3/uL — AB (ref 150–400)
RBC: 2.88 MIL/uL — AB (ref 4.22–5.81)
RDW: 19.1 % — ABNORMAL HIGH (ref 11.5–15.5)
WBC: 5 10*3/uL (ref 4.0–10.5)

## 2015-05-22 LAB — RENAL FUNCTION PANEL
ALBUMIN: 2.9 g/dL — AB (ref 3.5–5.0)
ANION GAP: 13 (ref 5–15)
BUN: 75 mg/dL — ABNORMAL HIGH (ref 6–20)
CALCIUM: 9 mg/dL (ref 8.9–10.3)
CO2: 27 mmol/L (ref 22–32)
CREATININE: 8.8 mg/dL — AB (ref 0.61–1.24)
Chloride: 92 mmol/L — ABNORMAL LOW (ref 101–111)
GFR, EST AFRICAN AMERICAN: 7 mL/min — AB (ref >60)
GFR, EST NON AFRICAN AMERICAN: 6 mL/min — AB (ref >60)
Glucose, Bld: 137 mg/dL — ABNORMAL HIGH (ref 65–99)
PHOSPHORUS: 5 mg/dL — AB (ref 2.5–4.6)
Potassium: 4.4 mmol/L (ref 3.5–5.1)
SODIUM: 132 mmol/L — AB (ref 135–145)

## 2015-05-22 NOTE — Progress Notes (Signed)
  Subjective:  Patient seen during dialysis Tolerating fair Was sleeping earlier. Good hemodynamic parameters As soon as he woke up, HR and BP were higher    Objective:  Vital signs in last 24 hours:  pulse 103 respirations 24 blood pressure 168/93  Physical Exam: General: Critically ill appearing  HEENT anicteric  Neck Trach in place  Pulm/lungs bilateral rhonchi,   CVS/Heart S1S2 tachycardic, irregular  Abdomen:  Soft, mild distension,    Extremities: + dependent edema in upper arms  Neurologic: Awake,     Skin: No acute rashes  Access: Rt IJ PC       Basic Metabolic Panel:   Recent Labs Lab 05/18/15 1322 05/20/15 1452 05/22/15 1359  NA 128* 131* 132*  K 4.8 4.7 4.4  CL 88* 89* 92*  CO2 24 25 27   GLUCOSE 107* 138* 137*  BUN 104* 73* 75*  CREATININE 11.04* 9.43* 8.80*  CALCIUM 8.7* 9.4 9.0  PHOS 5.9* 5.7* 5.0*     CBC:  Recent Labs Lab 05/18/15 1322 05/20/15 1451 05/22/15 1359  WBC 4.9 6.2 5.0  HGB 8.1* 9.1* 8.4*  HCT 26.2* 30.8* 28.3*  MCV 94.9 95.7 98.3  PLT 131* 168 134*      Microbiology:  Recent Results (from the past 720 hour(s))  Culture, blood (routine x 2)     Status: None   Collection Time: 04/24/15  9:47 AM  Result Value Ref Range Status   Specimen Description BLOOD CENTRAL LINE  Final   Special Requests BOTTLES DRAWN AEROBIC AND ANAEROBIC 5CC  Final   Culture NO GROWTH 5 DAYS  Final   Report Status 04/29/2015 FINAL  Final  Culture, blood (routine x 2)     Status: None   Collection Time: 04/24/15 11:20 AM  Result Value Ref Range Status   Specimen Description BLOOD RIGHT ANTECUBITAL  Final   Special Requests BOTTLES DRAWN AEROBIC AND ANAEROBIC 10CC  Final   Culture NO GROWTH 5 DAYS  Final   Report Status 04/29/2015 FINAL  Final    Coagulation Studies: No results for input(s): LABPROT, INR in the last 72 hours.  Urinalysis: No results for input(s): COLORURINE, LABSPEC, PHURINE, GLUCOSEU, HGBUR, BILIRUBINUR, KETONESUR,  PROTEINUR, UROBILINOGEN, NITRITE, LEUKOCYTESUR in the last 72 hours.  Invalid input(s): APPERANCEUR    Imaging: No results found.   Medications:    ARANESP 100 Q WED   Assessment/ Plan:  55 y.o. male  with a PMHX of ESRD, hypertension, congestive heart failure, was admitted on 04/15/2015  He normally dialyzes at the [facility] [facility]  1. ESRD 2. Acute respiratory failure 3. Anemia of chronic kidney disease-   4. Secondary hyperparathyroidism 5. Thrombocytopenia, HIT POSITIVE- use citrate in catheter. 6. Hypokalemia 7. S/p PEA arrest 05/06/15  Plan: Patient seen during dialysis BFR 300 today Hemoglobin currently 8.4. Patient to be continued on Aranesp. Phosphorus down to 5.0, continue phosLo Continue to monitor progress.   LOS:  Mosetta Pigeon 3/3/20174:45 PM

## 2015-05-24 ENCOUNTER — Other Ambulatory Visit (HOSPITAL_COMMUNITY): Payer: Self-pay

## 2015-05-24 MED ORDER — IOHEXOL 300 MG/ML  SOLN
50.0000 mL | Freq: Once | INTRAMUSCULAR | Status: AC | PRN
  Administered 2015-05-24: 50 mL via ORAL

## 2015-05-25 LAB — CBC
HCT: 29.3 % — ABNORMAL LOW (ref 39.0–52.0)
Hemoglobin: 8.9 g/dL — ABNORMAL LOW (ref 13.0–17.0)
MCH: 28.3 pg (ref 26.0–34.0)
MCHC: 30.4 g/dL (ref 30.0–36.0)
MCV: 93 fL (ref 78.0–100.0)
PLATELETS: 142 10*3/uL — AB (ref 150–400)
RBC: 3.15 MIL/uL — ABNORMAL LOW (ref 4.22–5.81)
RDW: 18.8 % — AB (ref 11.5–15.5)
WBC: 5.8 10*3/uL (ref 4.0–10.5)

## 2015-05-25 LAB — RENAL FUNCTION PANEL
ALBUMIN: 3 g/dL — AB (ref 3.5–5.0)
Anion gap: 18 — ABNORMAL HIGH (ref 5–15)
BUN: 95 mg/dL — AB (ref 6–20)
CO2: 20 mmol/L — ABNORMAL LOW (ref 22–32)
CREATININE: 9.21 mg/dL — AB (ref 0.61–1.24)
Calcium: 9.6 mg/dL (ref 8.9–10.3)
Chloride: 95 mmol/L — ABNORMAL LOW (ref 101–111)
GFR calc Af Amer: 7 mL/min — ABNORMAL LOW (ref >60)
GFR calc non Af Amer: 6 mL/min — ABNORMAL LOW (ref >60)
GLUCOSE: 78 mg/dL (ref 65–99)
PHOSPHORUS: 3.3 mg/dL (ref 2.5–4.6)
POTASSIUM: 5.7 mmol/L — AB (ref 3.5–5.1)
SODIUM: 133 mmol/L — AB (ref 135–145)

## 2015-05-25 NOTE — Progress Notes (Signed)
PULMONARY / CRITICAL CARE MEDICINE   Name: Gary Rivera MRN: [id number] DOB: 1960-08-10    ADMISSION DATE:  04/15/2015   REFERRING MD:  edp  CHIEF COMPLAINT:  Resp failure  HISTORY OF PRESENT ILLNESS:    55 yo MO aam with ESRD.MO,Non compliance, HTN, PHTN. Admitted w/ recurrent HCRF after missing HD. Failed NIPPV & was intubated. Failed extubation d/t difficult airway and refractory agitated delirium. Had trach placed 1/20. Was transferred to [facility] for further titration of his delirium therapy and weaning efforts.   SUBJECTIVE:    VITAL SIGNS: 97.2 88 20 140/78 sats 98%  PHYSICAL EXAMINATION: General:  MO AAM slow to wake up after HD.  Neuro: slow to arouse, sp slurred, generalized weakness HEENT:  Trach w/ PMV in place, + upper airway rhonchi Cardiovascular:  HSD; rrr Lungs: Coarse rhonchi Abdomen: obese soft +bs Musculoskeletal:  intact Skin:  Left antecubital wound noted   LABS:  BMET  Recent Labs Lab 05/20/15 1452 05/22/15 1359 05/25/15 0612  NA 131* 132* 133*  K 4.7 4.4 5.7*  CL 89* 92* 95*  CO2 25 27 20*  BUN 73* 75* 95*  CREATININE 9.43* 8.80* 9.21*  GLUCOSE 138* 137* 78    Electrolytes  Recent Labs Lab 05/20/15 1452 05/22/15 1359 05/25/15 0612  CALCIUM 9.4 9.0 9.6  PHOS 5.7* 5.0* 3.3    CBC  Recent Labs Lab 05/20/15 1451 05/22/15 1359 05/25/15 0612  WBC 6.2 5.0 5.8  HGB 9.1* 8.4* 8.9*  HCT 30.8* 28.3* 29.3*  PLT 168 134* 142*    Coag's No results for input(s): APTT, INR in the last 168 hours.  Sepsis Markers No results for input(s): LATICACIDVEN, PROCALCITON, O2SATVEN in the last 168 hours.  ABG No results for input(s): PHART, PCO2ART, PO2ART in the last 168 hours.  Liver Enzymes  Recent Labs Lab 05/20/15 1452 05/22/15 1359 05/25/15 0612  ALBUMIN 3.3* 2.9* 3.0*    Cardiac Enzymes No results for input(s): TROPONINI, PROBNP in the last 168 hours.  Glucose No results for input(s): GLUCAP in the last 168  hours.  Imaging Dg Abd Portable 1v  05/25/2015  CLINICAL DATA:  Percutaneous endoscopic gastrostomy tube placement. EXAM: PORTABLE ABDOMEN - 1 VIEW COMPARISON:  04/23/2015 FINDINGS: The entire upper abdomen is not included within the field of view. At the upper limits of the image, there is evidence of a gastrostomy tube. Contrast material has apparently been injected through the tube and is present in the stomach and small bowel. No contrast extravasation is identified. This suggests tube placement within the stomach. IMPRESSION: Contrast material is noted in the stomach and small bowel suggesting gastrostomy tube placement within the stomach. Electronically Signed   By: Burman Nieves M.D.   On: 05/25/2015 01:25     STUDIES:    CULTURES: 1/8 bc x 2>>neg  1/8 uc>>neg  1/8 sputum>>neg   ANTIBIOTICS: 1/8 vanc>> 1/8 zoysn>>  SIGNIFICANT EVENTS: 1/6 dc from cone follwing intubation  LINES/TUBES: 1/8 ETT>>  DISCUSSION:  ASSESSMENT / PLAN:   VDRF secondary to hypercarbia -->now trach dependent d/t prolonged agitated delirium and difficult airway.  -baseline OSA/OHS Htn phtn mvr ESRD, right ij tunneled hd cath  Hyperkalemia  GI protection HX of peritoneal dialysis with no open wound  Anemia of chronic illness  Plan:   ATC during day Vent at night Limit sedation as able Volume removal w/ HD Not a decannulation candidate Will need vent/snf PPI Follow hgb  FAMILY  - Updates: None at bedside  Theron Arista  Duncan Dull ACNP-BC [facility] Pulmonary/Critical Care Pager # [phone] OR # [phone] if no answer

## 2015-05-25 NOTE — Progress Notes (Signed)
  Subjective:  Patient seen and evaluated during hemodialysis. Appears to be slightly agitated and appears to be reaching for things. Ultrafiltration target 1.5 kg.    Objective:  Vital signs in last 24 hours: Temperature 95.4 pulse 136 respirations 20 blood pressure 172/85  Physical Exam: General: Slightly agitated  HEENT anicteric  Neck Trach in place  Pulm/lungs bilateral rhonchi, normal effort  CVS/Heart S1S2 tachycardic, irregular  Abdomen:  Soft, mild distension,  BS present  Extremities: + dependent edema in upper arms  Neurologic: Awake, alert, slightly agitated  Skin: No acute rashes  Access: Rt IJ PC       Basic Metabolic Panel:   Recent Labs Lab 05/20/15 1452 05/22/15 1359 05/25/15 0612  NA 131* 132* 133*  K 4.7 4.4 5.7*  CL 89* 92* 95*  CO2 25 27 20*  GLUCOSE 138* 137* 78  BUN 73* 75* 95*  CREATININE 9.43* 8.80* 9.21*  CALCIUM 9.4 9.0 9.6  PHOS 5.7* 5.0* 3.3     CBC:  Recent Labs Lab 05/20/15 1451 05/22/15 1359 05/25/15 0612  WBC 6.2 5.0 5.8  HGB 9.1* 8.4* 8.9*  HCT 30.8* 28.3* 29.3*  MCV 95.7 98.3 93.0  PLT 168 134* 142*      Microbiology:  No results found for this or any previous visit (from the past 720 hour(s)).  Coagulation Studies: No results for input(s): LABPROT, INR in the last 72 hours.  Urinalysis: No results for input(s): COLORURINE, LABSPEC, PHURINE, GLUCOSEU, HGBUR, BILIRUBINUR, KETONESUR, PROTEINUR, UROBILINOGEN, NITRITE, LEUKOCYTESUR in the last 72 hours.  Invalid input(s): APPERANCEUR    Imaging: Dg Abd Portable 1v  05/25/2015  CLINICAL DATA:  Percutaneous endoscopic gastrostomy tube placement. EXAM: PORTABLE ABDOMEN - 1 VIEW COMPARISON:  04/23/2015 FINDINGS: The entire upper abdomen is not included within the field of view. At the upper limits of the image, there is evidence of a gastrostomy tube. Contrast material has apparently been injected through the tube and is present in the stomach and small bowel. No  contrast extravasation is identified. This suggests tube placement within the stomach. IMPRESSION: Contrast material is noted in the stomach and small bowel suggesting gastrostomy tube placement within the stomach. Electronically Signed   By: Burman Nieves M.D.   On: 05/25/2015 01:25     Medications:    ARANESP 100 Q WED   Assessment/ Plan:  55 y.o. male  with a PMHX of ESRD, hypertension, congestive heart failure, was admitted on 04/15/2015  He normally dialyzes at the [facility] [facility]  1. ESRD 2. Acute respiratory failure 3. Anemia of chronic kidney disease-   4. Secondary hyperparathyroidism 5. Thrombocytopenia, HIT POSITIVE- use citrate in catheter. 6. Hypokalemia 7. S/p PEA arrest 05/06/15  Plan: Patient seen and evaluated during hemodialysis today. Blood flow rate 400. Ultrafiltration target 1.5 kg. Hemoglobin currently up to 8.9. Continue Aranesp. Next dialysis on Wednesday.   LOS:  Antonia Jicha 3/6/20173:40 PM

## 2015-05-26 ENCOUNTER — Other Ambulatory Visit (HOSPITAL_COMMUNITY): Payer: Self-pay

## 2015-05-27 LAB — BLOOD GAS, ARTERIAL
Acid-Base Excess: 1.8 mmol/L (ref 0.0–2.0)
Bicarbonate: 25.7 mEq/L — ABNORMAL HIGH (ref 20.0–24.0)
FIO2: 0.4
LHR: 20 {breaths}/min
MECHVT: 600 mL
O2 SAT: 98.3 %
PATIENT TEMPERATURE: 98.6
PCO2 ART: 39.3 mmHg (ref 35.0–45.0)
PEEP/CPAP: 5 cmH2O
PH ART: 7.432 (ref 7.350–7.450)
PO2 ART: 97.6 mmHg (ref 80.0–100.0)
TCO2: 26.9 mmol/L (ref 0–100)

## 2015-05-27 LAB — RENAL FUNCTION PANEL
ANION GAP: 17 — AB (ref 5–15)
Albumin: 3 g/dL — ABNORMAL LOW (ref 3.5–5.0)
BUN: 66 mg/dL — ABNORMAL HIGH (ref 6–20)
CALCIUM: 9.7 mg/dL (ref 8.9–10.3)
CHLORIDE: 94 mmol/L — AB (ref 101–111)
CO2: 24 mmol/L (ref 22–32)
Creatinine, Ser: 8.05 mg/dL — ABNORMAL HIGH (ref 0.61–1.24)
GFR calc Af Amer: 8 mL/min — ABNORMAL LOW (ref >60)
GFR calc non Af Amer: 7 mL/min — ABNORMAL LOW (ref >60)
GLUCOSE: 101 mg/dL — AB (ref 65–99)
Phosphorus: 2.6 mg/dL (ref 2.5–4.6)
Potassium: 4.7 mmol/L (ref 3.5–5.1)
SODIUM: 135 mmol/L (ref 135–145)

## 2015-05-27 LAB — CBC
HCT: 32.9 % — ABNORMAL LOW (ref 39.0–52.0)
Hemoglobin: 9.8 g/dL — ABNORMAL LOW (ref 13.0–17.0)
MCH: 29.1 pg (ref 26.0–34.0)
MCHC: 29.8 g/dL — AB (ref 30.0–36.0)
MCV: 97.6 fL (ref 78.0–100.0)
PLATELETS: 149 10*3/uL — AB (ref 150–400)
RBC: 3.37 MIL/uL — ABNORMAL LOW (ref 4.22–5.81)
RDW: 18.9 % — ABNORMAL HIGH (ref 11.5–15.5)
WBC: 5.4 10*3/uL (ref 4.0–10.5)

## 2015-05-27 NOTE — Progress Notes (Signed)
  Subjective:  Patient had HD today. Tolerated well. UF achieved was 2kg.    Objective:  Vital signs in last 24 hours: Temperature 96.9 pulse 96 respirations 24 blood pressure 151/79   Physical Exam: General: Slightly agitated  HEENT anicteric  Neck Trach in place  Pulm/lungs bilateral rhonchi, normal effort  CVS/Heart S1S2 irregular  Abdomen:  Soft, mild distension,  BS present  Extremities: + dependent edema in upper arms  Neurologic: Awake, alert, follows commands  Skin: No acute rashes  Access: Rt IJ PC       Basic Metabolic Panel:   Recent Labs Lab 05/22/15 1359 05/25/15 0612 05/27/15 0651  NA 132* 133* 135  K 4.4 5.7* 4.7  CL 92* 95* 94*  CO2 27 20* 24  GLUCOSE 137* 78 101*  BUN 75* 95* 66*  CREATININE 8.80* 9.21* 8.05*  CALCIUM 9.0 9.6 9.7  PHOS 5.0* 3.3 2.6     CBC:  Recent Labs Lab 05/22/15 1359 05/25/15 0612 05/27/15 0651  WBC 5.0 5.8 5.4  HGB 8.4* 8.9* 9.8*  HCT 28.3* 29.3* 32.9*  MCV 98.3 93.0 97.6  PLT 134* 142* 149*      Microbiology:  No results found for this or any previous visit (from the past 720 hour(s)).  Coagulation Studies: No results for input(s): LABPROT, INR in the last 72 hours.  Urinalysis: No results for input(s): COLORURINE, LABSPEC, PHURINE, GLUCOSEU, HGBUR, BILIRUBINUR, KETONESUR, PROTEINUR, UROBILINOGEN, NITRITE, LEUKOCYTESUR in the last 72 hours.  Invalid input(s): APPERANCEUR    Imaging: Dg Chest Port 1 View  05/26/2015  CLINICAL DATA:  Respiratory failure, agitated EXAM: PORTABLE CHEST 1 VIEW COMPARISON:  05/11/2015 FINDINGS: There is a tracheostomy tube in satisfactory position. There is bilateral interstitial thickening and prominence of the central pulmonary vasculature. There is no pleural effusion or pneumothorax. There is stable cardiomegaly. There is a dual-lumen right-sided central venous catheter in satisfactory position. The osseous structures are unremarkable. IMPRESSION: Cardiomegaly with  pulmonary vascular congestion. Electronically Signed   By: Elige Ko   On: 05/26/2015 18:10     Medications:    ARANESP 100 Q WED   Assessment/ Plan:  55 y.o. male  with a PMHX of ESRD, hypertension, congestive heart failure, was admitted on 04/15/2015  He normally dialyzes at the [facility] [facility]  1. ESRD 2. Acute respiratory failure 3. Anemia of chronic kidney disease-   4. Secondary hyperparathyroidism 5. Thrombocytopenia, HIT POSITIVE- use citrate in catheter. 6. Hypokalemia 7. S/p PEA arrest 05/06/15  Plan: Patient completed dialysis today. Ultrafiltration achieved with 2 kg. Hemoglobin up to 9.8 on Aranesp. Continue to use citrate in the catheter. Next dialysis on Friday.   LOS:  Gary Rivera 3/8/20174:01 PM

## 2015-05-28 NOTE — Progress Notes (Signed)
PULMONARY / CRITICAL CARE MEDICINE   Name: Gary Rivera MRN: [id number] DOB: [birth year]    ADMISSION DATE:  04/15/2015   REFERRING MD:  edp  CHIEF COMPLAINT:  Resp failure  HISTORY OF PRESENT ILLNESS:    55 yo MO aam with ESRD.MO,Non compliance, HTN, PHTN. Admitted w/ recurrent HCRF after missing HD. Failed NIPPV & was intubated. Failed extubation d/t difficult airway and refractory agitated delirium. Had trach placed 1/20. Was transferred to [facility] for further titration of his delirium therapy and weaning efforts.   SUBJECTIVE:    VITAL SIGNS: 98.0 105  20 146/70 sats 100%  PHYSICAL EXAMINATION: General:  MO AAM slow to wake up after HD.  Neuro: awake and alert. On full vent support HEENT:  Trach w/ PMV in place, + upper airway rhonchi Cardiovascular:  HSD; rrr Lungs: Coarse rhonchi Abdomen: obese soft +bs Musculoskeletal:  intact Skin:  Left antecubital wound noted   LABS:  BMET  Recent Labs Lab 05/22/15 1359 05/25/15 0612 05/27/15 0651  NA 132* 133* 135  K 4.4 5.7* 4.7  CL 92* 95* 94*  CO2 27 20* 24  BUN 75* 95* 66*  CREATININE 8.80* 9.21* 8.05*  GLUCOSE 137* 78 101*    Electrolytes  Recent Labs Lab 05/22/15 1359 05/25/15 0612 05/27/15 0651  CALCIUM 9.0 9.6 9.7  PHOS 5.0* 3.3 2.6    CBC  Recent Labs Lab 05/22/15 1359 05/25/15 0612 05/27/15 0651  WBC 5.0 5.8 5.4  HGB 8.4* 8.9* 9.8*  HCT 28.3* 29.3* 32.9*  PLT 134* 142* 149*    Coag's No results for input(s): APTT, INR in the last 168 hours.  Sepsis Markers No results for input(s): LATICACIDVEN, PROCALCITON, O2SATVEN in the last 168 hours.  ABG  Recent Labs Lab 05/27/15 0840  PHART 7.432  PCO2ART 39.3  PO2ART 97.6    Liver Enzymes  Recent Labs Lab 05/22/15 1359 05/25/15 0612 05/27/15 0651  ALBUMIN 2.9* 3.0* 3.0*    Cardiac Enzymes No results for input(s): TROPONINI, PROBNP in the last 168 hours.  Glucose No results for input(s): GLUCAP in the last 168  hours.  Imaging No results found.   STUDIES:    CULTURES: 1/8 bc x 2>>neg  1/8 uc>>neg  1/8 sputum>>neg   ANTIBIOTICS: Per ssh  SIGNIFICANT EVENTS: 1/6 dc from cone follwing intubation  LINES/TUBES: 1/8 ETT>>  DISCUSSION: Tolerated t collar 2 hrs 3/8. Plan 2 hrs  3/9 ASSESSMENT / PLAN:   VDRF secondary to hypercarbia -->now trach dependent d/t prolonged agitated delirium and difficult airway.  -baseline OSA/OHS Htn phtn mvr ESRD, right ij tunneled hd cath  Hyperkalemia  GI protection HX of peritoneal dialysis with no open wound  Anemia of chronic illness  Plan:   ATC during day Vent at night Limit sedation as able Volume removal w/ HD @goal  2 kg on 3/8 Not a decannulation candidate Will need vent/snf PPI Follow hgb  FAMILY  - Updates: None at bedside  [facility] Dhruvan Gullion ACNP Adolph Pollack PCCM Pager [phone] till 3 pm If no answer page [phone] 05/28/2015, 9:26 AM

## 2015-05-29 LAB — CBC
HCT: 32.6 % — ABNORMAL LOW (ref 39.0–52.0)
Hemoglobin: 9.5 g/dL — ABNORMAL LOW (ref 13.0–17.0)
MCH: 27.8 pg (ref 26.0–34.0)
MCHC: 29.1 g/dL — AB (ref 30.0–36.0)
MCV: 95.3 fL (ref 78.0–100.0)
PLATELETS: 149 10*3/uL — AB (ref 150–400)
RBC: 3.42 MIL/uL — ABNORMAL LOW (ref 4.22–5.81)
RDW: 19 % — AB (ref 11.5–15.5)
WBC: 4.6 10*3/uL (ref 4.0–10.5)

## 2015-05-29 LAB — RENAL FUNCTION PANEL
Albumin: 2.9 g/dL — ABNORMAL LOW (ref 3.5–5.0)
Anion gap: 14 (ref 5–15)
BUN: 53 mg/dL — ABNORMAL HIGH (ref 6–20)
CALCIUM: 9.1 mg/dL (ref 8.9–10.3)
CO2: 24 mmol/L (ref 22–32)
CREATININE: 7.51 mg/dL — AB (ref 0.61–1.24)
Chloride: 94 mmol/L — ABNORMAL LOW (ref 101–111)
GFR, EST AFRICAN AMERICAN: 8 mL/min — AB (ref >60)
GFR, EST NON AFRICAN AMERICAN: 7 mL/min — AB (ref >60)
Glucose, Bld: 78 mg/dL (ref 65–99)
PHOSPHORUS: 7.1 mg/dL — AB (ref 2.5–4.6)
Potassium: 3.6 mmol/L (ref 3.5–5.1)
SODIUM: 132 mmol/L — AB (ref 135–145)

## 2015-05-29 NOTE — Progress Notes (Signed)
  Subjective:  patient seen and evaluated during hemodialysis. Currently awake and alert and appears to be doing well. Blood flow rate currently 400.  Objective:  Vital signs in last 24 hours: Temperature 96.4 pulse 80 respirations 24 blood pressure 109/65   Physical Exam: General: No acute distress  HEENT anicteric  Neck Trach in place  Pulm/lungs bilateral rhonchi, normal effort  CVS/Heart S1S2 irregular  Abdomen:  Soft, mild distension,  BS present  Extremities: Trace lower extremity edema  Neurologic: Awake, alert, follows commands  Skin: No acute rashes  Access: Rt IJ PC       Basic Metabolic Panel:   Recent Labs Lab 05/22/15 1359 05/25/15 0612 05/27/15 0651 05/29/15 0530  NA 132* 133* 135 132*  K 4.4 5.7* 4.7 3.6  CL 92* 95* 94* 94*  CO2 27 20* 24 24  GLUCOSE 137* 78 101* 78  BUN 75* 95* 66* 53*  CREATININE 8.80* 9.21* 8.05* 7.51*  CALCIUM 9.0 9.6 9.7 9.1  PHOS 5.0* 3.3 2.6 7.1*     CBC:  Recent Labs Lab 05/22/15 1359 05/25/15 0612 05/27/15 0651 05/29/15 0530  WBC 5.0 5.8 5.4 4.6  HGB 8.4* 8.9* 9.8* 9.5*  HCT 28.3* 29.3* 32.9* 32.6*  MCV 98.3 93.0 97.6 95.3  PLT 134* 142* 149* 149*      Microbiology:  No results found for this or any previous visit (from the past 720 hour(s)).  Coagulation Studies: No results for input(s): LABPROT, INR in the last 72 hours.  Urinalysis: No results for input(s): COLORURINE, LABSPEC, PHURINE, GLUCOSEU, HGBUR, BILIRUBINUR, KETONESUR, PROTEINUR, UROBILINOGEN, NITRITE, LEUKOCYTESUR in the last 72 hours.  Invalid input(s): APPERANCEUR    Imaging: No results found.   Medications:    ARANESP 100 Q WED   Assessment/ Plan:  55 y.o. male  with a PMHX of ESRD, hypertension, congestive heart failure, was admitted on 04/15/2015  He normally dialyzes at the [facility] [facility]  1. ESRD 2. Acute respiratory failure 3. Anemia of chronic kidney disease-   4. Secondary  hyperparathyroidism 5. Thrombocytopenia, HIT POSITIVE- use citrate in catheter. 6. Hypokalemia 7. S/p PEA arrest 05/06/15  Plan: Patient seen and evaluated during hemodialysis. Tolerating treatment quite well area Less agitated today. Complete hemodialysis today and plan for dialysis again on Monday. Continue to pack catheter with citrate. Hemoglobin currently 9.5. Continue Aranesp 100 mcg once weekly.   LOS:  Maximillian Habibi 3/10/20178:33 AM

## 2015-06-01 LAB — CBC
HCT: 37 % — ABNORMAL LOW (ref 39.0–52.0)
Hemoglobin: 10.5 g/dL — ABNORMAL LOW (ref 13.0–17.0)
MCH: 27.3 pg (ref 26.0–34.0)
MCHC: 28.4 g/dL — ABNORMAL LOW (ref 30.0–36.0)
MCV: 96.4 fL (ref 78.0–100.0)
PLATELETS: 169 10*3/uL (ref 150–400)
RBC: 3.84 MIL/uL — AB (ref 4.22–5.81)
RDW: 18.3 % — ABNORMAL HIGH (ref 11.5–15.5)
WBC: 4.8 10*3/uL (ref 4.0–10.5)

## 2015-06-01 LAB — RENAL FUNCTION PANEL
Albumin: 3 g/dL — ABNORMAL LOW (ref 3.5–5.0)
Anion gap: 16 — ABNORMAL HIGH (ref 5–15)
BUN: 57 mg/dL — AB (ref 6–20)
CALCIUM: 9.8 mg/dL (ref 8.9–10.3)
CO2: 26 mmol/L (ref 22–32)
CREATININE: 8.32 mg/dL — AB (ref 0.61–1.24)
Chloride: 96 mmol/L — ABNORMAL LOW (ref 101–111)
GFR, EST AFRICAN AMERICAN: 7 mL/min — AB (ref >60)
GFR, EST NON AFRICAN AMERICAN: 6 mL/min — AB (ref >60)
Glucose, Bld: 106 mg/dL — ABNORMAL HIGH (ref 65–99)
Phosphorus: 3 mg/dL (ref 2.5–4.6)
Potassium: 4.1 mmol/L (ref 3.5–5.1)
SODIUM: 138 mmol/L (ref 135–145)

## 2015-06-01 NOTE — Progress Notes (Signed)
  Subjective:  Patient completed hemodialysis today. He appears to be a bit agitated this afternoon. Ultrafiltration achieved was only 1.4 kg.   Objective:  Vital signs in last 24 hours: Pulse 111 respirations 16 blood pressure 106/87 pulse ox 99%  Physical Exam: General: No acute distress  HEENT anicteric  Neck Trach in place  Pulm/lungs bilateral rhonchi, normal effort  CVS/Heart S1S2 irregular  Abdomen:  Soft, mild distension,  BS present  Extremities: Trace lower extremity edema  Neurologic: Awake, alert, but agitated  Skin: No acute rashes  Access: Rt IJ PC       Basic Metabolic Panel:   Recent Labs Lab 05/27/15 0651 05/29/15 0530 06/01/15 0530  NA 135 132* 138  K 4.7 3.6 4.1  CL 94* 94* 96*  CO2 24 24 26   GLUCOSE 101* 78 106*  BUN 66* 53* 57*  CREATININE 8.05* 7.51* 8.32*  CALCIUM 9.7 9.1 9.8  PHOS 2.6 7.1* 3.0     CBC:  Recent Labs Lab 05/27/15 0651 05/29/15 0530 06/01/15 0530  WBC 5.4 4.6 4.8  HGB 9.8* 9.5* 10.5*  HCT 32.9* 32.6* 37.0*  MCV 97.6 95.3 96.4  PLT 149* 149* 169      Microbiology:  No results found for this or any previous visit (from the past 720 hour(s)).  Coagulation Studies: No results for input(s): LABPROT, INR in the last 72 hours.  Urinalysis: No results for input(s): COLORURINE, LABSPEC, PHURINE, GLUCOSEU, HGBUR, BILIRUBINUR, KETONESUR, PROTEINUR, UROBILINOGEN, NITRITE, LEUKOCYTESUR in the last 72 hours.  Invalid input(s): APPERANCEUR    Imaging: No results found.   Medications:    ARANESP 100 Q WED   Assessment/ Plan:  55 y.o. male  with a PMHX of ESRD, hypertension, congestive heart failure, was admitted on 04/15/2015  He normally dialyzes at the [facility] [facility]  1. ESRD on HD MWF 2. Acute respiratory failure 3. Anemia of chronic kidney disease-   4. Secondary hyperparathyroidism 5. Thrombocytopenia, HIT POSITIVE- use citrate in catheter. 6.  Hypokalemia 7. S/p PEA arrest 05/06/15  Plan: Patient seen after hemodialysis today. Ultrafiltration target was not achieved and was 1.4 kg today. We will plan for dialysis again on Wednesday with ultrafiltration target of 1.5 kg.  Hemoglobin currently 10.5 and acceptable. The patient is noted to be on Aranesp. Continue to use citrate with catheter packing. We will continue to monitor the patient's progress closely.   LOS:  Seirra Kos 3/13/20173:56 PM

## 2015-06-03 LAB — RENAL FUNCTION PANEL
ALBUMIN: 3 g/dL — AB (ref 3.5–5.0)
ANION GAP: 18 — AB (ref 5–15)
BUN: 56 mg/dL — ABNORMAL HIGH (ref 6–20)
CALCIUM: 9.1 mg/dL (ref 8.9–10.3)
CO2: 26 mmol/L (ref 22–32)
Chloride: 95 mmol/L — ABNORMAL LOW (ref 101–111)
Creatinine, Ser: 7.58 mg/dL — ABNORMAL HIGH (ref 0.61–1.24)
GFR calc non Af Amer: 7 mL/min — ABNORMAL LOW (ref >60)
GFR, EST AFRICAN AMERICAN: 8 mL/min — AB (ref >60)
Glucose, Bld: 101 mg/dL — ABNORMAL HIGH (ref 65–99)
PHOSPHORUS: 2.5 mg/dL (ref 2.5–4.6)
POTASSIUM: 4.1 mmol/L (ref 3.5–5.1)
SODIUM: 139 mmol/L (ref 135–145)

## 2015-06-03 LAB — CBC
HEMATOCRIT: 36.8 % — AB (ref 39.0–52.0)
HEMOGLOBIN: 11.1 g/dL — AB (ref 13.0–17.0)
MCH: 28.8 pg (ref 26.0–34.0)
MCHC: 30.2 g/dL (ref 30.0–36.0)
MCV: 95.6 fL (ref 78.0–100.0)
Platelets: 153 10*3/uL (ref 150–400)
RBC: 3.85 MIL/uL — AB (ref 4.22–5.81)
RDW: 18.3 % — ABNORMAL HIGH (ref 11.5–15.5)
WBC: 4.9 10*3/uL (ref 4.0–10.5)

## 2015-06-03 NOTE — Progress Notes (Signed)
  Subjective:  The patient completed hemodialysis today. Ultrafiltration achieved was only 0.4 kg. Patient is currently awake and alert and will follow simple commands.   Objective:  Vital signs in last 24 hours: Pulse 114 respirations 21 blood pressure 125/66 pulse ox 98% on the ventilator  Physical Exam: General: No acute distress  HEENT anicteric  Neck Trach in place  Pulm/lungs bilateral rhonchi, normal effort  CVS/Heart S1S2 irregular  Abdomen:  Soft, mild distension,  BS present  Extremities: Trace lower extremity edema  Neurologic: Awake, alert, will follow simple commands  Skin: No acute rashes  Access: Rt IJ PC       Basic Metabolic Panel:   Recent Labs Lab 05/29/15 0530 06/01/15 0530 06/03/15 0525  NA 132* 138 139  K 3.6 4.1 4.1  CL 94* 96* 95*  CO2 24 26 26   GLUCOSE 78 106* 101*  BUN 53* 57* 56*  CREATININE 7.51* 8.32* 7.58*  CALCIUM 9.1 9.8 9.1  PHOS 7.1* 3.0 2.5     CBC:  Recent Labs Lab 05/29/15 0530 06/01/15 0530 06/03/15 0525  WBC 4.6 4.8 4.9  HGB 9.5* 10.5* 11.1*  HCT 32.6* 37.0* 36.8*  MCV 95.3 96.4 95.6  PLT 149* 169 153      Microbiology:  No results found for this or any previous visit (from the past 720 hour(s)).  Coagulation Studies: No results for input(s): LABPROT, INR in the last 72 hours.  Urinalysis: No results for input(s): COLORURINE, LABSPEC, PHURINE, GLUCOSEU, HGBUR, BILIRUBINUR, KETONESUR, PROTEINUR, UROBILINOGEN, NITRITE, LEUKOCYTESUR in the last 72 hours.  Invalid input(s): APPERANCEUR    Imaging: No results found.   Medications:    ARANESP 100 Q WED   Assessment/ Plan:  55 y.o. male  with a PMHX of ESRD, hypertension, congestive heart failure, was admitted on 04/15/2015  He normally dialyzes at the [facility] [facility]  1. ESRD on HD MWF 2. Acute respiratory failure 3. Anemia of chronic kidney disease-   4. Secondary hyperparathyroidism 5.  Thrombocytopenia, HIT POSITIVE- use citrate in catheter. 6. Hypokalemia 7. S/p PEA arrest 05/06/15  Plan: Dialysis overall continues to progress well. However today we are unable to perform full ultrafiltration as ordered. We will add albumin with dialysis during the next treatment. Hemoglobin currently up to 11.1 and patient on Aranesp. Phosphorus remains under good control at 2.5 which we will continue to monitor. Continue to pack catheter with citrate.   Will follow with you.    LOS:  Gaylyn Berish 3/15/20174:06 PM

## 2015-06-04 ENCOUNTER — Institutional Professional Consult (permissible substitution): Payer: Medicare Other | Admitting: Internal Medicine

## 2015-06-04 LAB — CBC
HEMATOCRIT: 37.1 % — AB (ref 39.0–52.0)
Hemoglobin: 11 g/dL — ABNORMAL LOW (ref 13.0–17.0)
MCH: 28.6 pg (ref 26.0–34.0)
MCHC: 29.6 g/dL — ABNORMAL LOW (ref 30.0–36.0)
MCV: 96.6 fL (ref 78.0–100.0)
Platelets: 159 10*3/uL (ref 150–400)
RBC: 3.84 MIL/uL — ABNORMAL LOW (ref 4.22–5.81)
RDW: 18.1 % — AB (ref 11.5–15.5)
WBC: 4.9 10*3/uL (ref 4.0–10.5)

## 2015-06-05 LAB — RENAL FUNCTION PANEL
ALBUMIN: 3 g/dL — AB (ref 3.5–5.0)
ANION GAP: 17 — AB (ref 5–15)
BUN: 69 mg/dL — ABNORMAL HIGH (ref 6–20)
CHLORIDE: 96 mmol/L — AB (ref 101–111)
CO2: 26 mmol/L (ref 22–32)
Calcium: 9.3 mg/dL (ref 8.9–10.3)
Creatinine, Ser: 7.28 mg/dL — ABNORMAL HIGH (ref 0.61–1.24)
GFR calc Af Amer: 9 mL/min — ABNORMAL LOW (ref >60)
GFR calc non Af Amer: 8 mL/min — ABNORMAL LOW (ref >60)
GLUCOSE: 116 mg/dL — AB (ref 65–99)
PHOSPHORUS: 2.7 mg/dL (ref 2.5–4.6)
POTASSIUM: 4.1 mmol/L (ref 3.5–5.1)
Sodium: 139 mmol/L (ref 135–145)

## 2015-06-05 LAB — CBC
HEMATOCRIT: 37.5 % — AB (ref 39.0–52.0)
HEMOGLOBIN: 10.7 g/dL — AB (ref 13.0–17.0)
MCH: 27.4 pg (ref 26.0–34.0)
MCHC: 28.5 g/dL — AB (ref 30.0–36.0)
MCV: 96.2 fL (ref 78.0–100.0)
Platelets: 161 10*3/uL (ref 150–400)
RBC: 3.9 MIL/uL — ABNORMAL LOW (ref 4.22–5.81)
RDW: 18.2 % — AB (ref 11.5–15.5)
WBC: 5.5 10*3/uL (ref 4.0–10.5)

## 2015-06-05 NOTE — Progress Notes (Signed)
  Subjective:  The patient completed hemodialysis today. Ultrafiltration and she was 1.5 kg. He remains slightly tachycardic.   Objective:  Vital signs in last 24 hours: Temperature 90.6 pulse 110 respirations 20 temperature 131/84  Physical Exam: General: No acute distress  HEENT anicteric  Neck Trach in place  Pulm/lungs bilateral rhonchi, normal effort  CVS/Heart S1S2 irregular  Abdomen:  Soft, mild distension,  BS present  Extremities: Trace lower extremity edema  Neurologic: Awake, alert, will follow simple commands  Skin: No acute rashes  Access: Rt IJ PC       Basic Metabolic Panel:   Recent Labs Lab 06/01/15 0530 06/03/15 0525 06/05/15 0529  NA 138 139 139  K 4.1 4.1 4.1  CL 96* 95* 96*  CO2 26 26 26   GLUCOSE 106* 101* 116*  BUN 57* 56* 69*  CREATININE 8.32* 7.58* 7.28*  CALCIUM 9.8 9.1 9.3  PHOS 3.0 2.5 2.7     CBC:  Recent Labs Lab 06/01/15 0530 06/03/15 0525 06/04/15 0714 06/05/15 0529  WBC 4.8 4.9 4.9 5.5  HGB 10.5* 11.1* 11.0* 10.7*  HCT 37.0* 36.8* 37.1* 37.5*  MCV 96.4 95.6 96.6 96.2  PLT 169 153 159 161      Microbiology:  No results found for this or any previous visit (from the past 720 hour(s)).  Coagulation Studies: No results for input(s): LABPROT, INR in the last 72 hours.  Urinalysis: No results for input(s): COLORURINE, LABSPEC, PHURINE, GLUCOSEU, HGBUR, BILIRUBINUR, KETONESUR, PROTEINUR, UROBILINOGEN, NITRITE, LEUKOCYTESUR in the last 72 hours.  Invalid input(s): APPERANCEUR    Imaging: No results found.   Medications:    ARANESP 100 Q WED   Assessment/ Plan:  55 y.o. male  with a PMHX of ESRD, hypertension, congestive heart failure, was admitted on 04/15/2015  He normally dialyzes at the [facility] [facility]  1. ESRD on HD MWF 2. Acute respiratory failure 3. Anemia of chronic kidney disease-   4. Secondary hyperparathyroidism 5. Thrombocytopenia, HIT POSITIVE- use  citrate in catheter. 6. Hypokalemia 7. S/p PEA arrest 05/06/15  Plan: Patient continues to tolerate hemodialysis well. Ultrafiltration achieved was 1.5 kg. He will need continued ventilatory support at this time. Hemoglobin currently 10.7 and acceptable. Continue current dosage of Aranesp. Platelets stable at 161,000. Continue PEG catheter with citrate. Next hemodialysis will be on Monday.   LOS:  Nance Mccombs 3/17/20175:33 PM

## 2015-06-08 LAB — RENAL FUNCTION PANEL
Albumin: 2.9 g/dL — ABNORMAL LOW (ref 3.5–5.0)
Anion gap: 14 (ref 5–15)
BUN: 101 mg/dL — AB (ref 6–20)
CHLORIDE: 94 mmol/L — AB (ref 101–111)
CO2: 26 mmol/L (ref 22–32)
CREATININE: 8.56 mg/dL — AB (ref 0.61–1.24)
Calcium: 9.6 mg/dL (ref 8.9–10.3)
GFR calc Af Amer: 7 mL/min — ABNORMAL LOW (ref >60)
GFR calc non Af Amer: 6 mL/min — ABNORMAL LOW (ref >60)
Glucose, Bld: 99 mg/dL (ref 65–99)
Phosphorus: 2.6 mg/dL (ref 2.5–4.6)
Potassium: 4.7 mmol/L (ref 3.5–5.1)
Sodium: 134 mmol/L — ABNORMAL LOW (ref 135–145)

## 2015-06-08 LAB — CBC WITH DIFFERENTIAL/PLATELET
Basophils Absolute: 0 10*3/uL (ref 0.0–0.1)
Basophils Relative: 0 %
EOS PCT: 2 %
Eosinophils Absolute: 0.1 10*3/uL (ref 0.0–0.7)
HCT: 35.2 % — ABNORMAL LOW (ref 39.0–52.0)
Hemoglobin: 10.6 g/dL — ABNORMAL LOW (ref 13.0–17.0)
LYMPHS ABS: 1.4 10*3/uL (ref 0.7–4.0)
LYMPHS PCT: 25 %
MCH: 28.2 pg (ref 26.0–34.0)
MCHC: 30.1 g/dL (ref 30.0–36.0)
MCV: 93.6 fL (ref 78.0–100.0)
MONO ABS: 0.6 10*3/uL (ref 0.1–1.0)
Monocytes Relative: 12 %
Neutro Abs: 3.4 10*3/uL (ref 1.7–7.7)
Neutrophils Relative %: 61 %
PLATELETS: 173 10*3/uL (ref 150–400)
RBC: 3.76 MIL/uL — ABNORMAL LOW (ref 4.22–5.81)
RDW: 18 % — AB (ref 11.5–15.5)
WBC: 5.5 10*3/uL (ref 4.0–10.5)

## 2015-06-08 NOTE — Progress Notes (Signed)
  Subjective:  The patient completed hemodialysis today. Ultrafiltration and she was 0.8 kg. He was agitated during dialysis. Treatment had to be stopped early   Objective:  Vital signs in last 24 hours:  pulse 75-145 respirations 20 temperature 107/73 to 71/37  Physical Exam: General: No acute distress  HEENT anicteric  Neck Trach in place  Pulm/lungs bilateral rhonchi, vent assisted 35/5  CVS/Heart S1S2 irregular  Abdomen:  Soft, mild distension,  BS present  Extremities: + extremity edema  Neurologic: Sedated, did not follow commands  Skin: No acute rashes  Access: Rt IJ PC       Basic Metabolic Panel:   Recent Labs Lab 06/03/15 0525 06/05/15 0529 06/08/15 0705  NA 139 139 134*  K 4.1 4.1 4.7  CL 95* 96* 94*  CO2 26 26 26   GLUCOSE 101* 116* 99  BUN 56* 69* 101*  CREATININE 7.58* 7.28* 8.56*  CALCIUM 9.1 9.3 9.6  PHOS 2.5 2.7 2.6     CBC:  Recent Labs Lab 06/03/15 0525 06/04/15 0714 06/05/15 0529 06/08/15 0705  WBC 4.9 4.9 5.5 5.5  NEUTROABS  --   --   --  3.4  HGB 11.1* 11.0* 10.7* 10.6*  HCT 36.8* 37.1* 37.5* 35.2*  MCV 95.6 96.6 96.2 93.6  PLT 153 159 161 173      Microbiology:  No results found for this or any previous visit (from the past 720 hour(s)).  Coagulation Studies: No results for input(s): LABPROT, INR in the last 72 hours.  Urinalysis: No results for input(s): COLORURINE, LABSPEC, PHURINE, GLUCOSEU, HGBUR, BILIRUBINUR, KETONESUR, PROTEINUR, UROBILINOGEN, NITRITE, LEUKOCYTESUR in the last 72 hours.  Invalid input(s): APPERANCEUR    Imaging: No results found.   Medications:    ARANESP 100 Q WED   Assessment/ Plan:  55 y.o. male  with a PMHX of ESRD, hypertension, congestive heart failure, was admitted on 04/15/2015  He normally dialyzes at the [facility] [facility]  1. ESRD on HD MWF 2. Acute respiratory failure 3. Anemia of chronic kidney disease-   4. Secondary  hyperparathyroidism 5. Thrombocytopenia, HIT POSITIVE- use citrate in catheter. 6. Hypokalemia 7. S/p PEA arrest 05/06/15  Plan: Patient continues to tolerate hemodialysis but with some agitation during treatment Ultrafiltration achieved was 0.8 kg. He will need continued ventilatory support at this time. Hemoglobin currently 10.6 and acceptable. Continue current dosage of Aranesp. Platelets stable . Continue Pack catheter with citrate.    LOS:  Tanina Barb 3/20/20175:20 PM

## 2015-06-15 ENCOUNTER — Other Ambulatory Visit: Payer: Self-pay | Admitting: Internal Medicine

## 2015-06-15 DIAGNOSIS — R0600 Dyspnea, unspecified: Secondary | ICD-10-CM

## 2015-06-16 ENCOUNTER — Ambulatory Visit: Payer: Medicare Other | Admitting: Internal Medicine

## 2015-07-20 DEATH — deceased

## 2016-03-27 IMAGING — CR DG CHEST 1V PORT
1 series · 1 of 1 positions shown · non-contrast
Comparison: 03/29/2015

CLINICAL DATA: Shortness of breath, intubation

EXAM:
PORTABLE CHEST 1 VIEW

[AP]
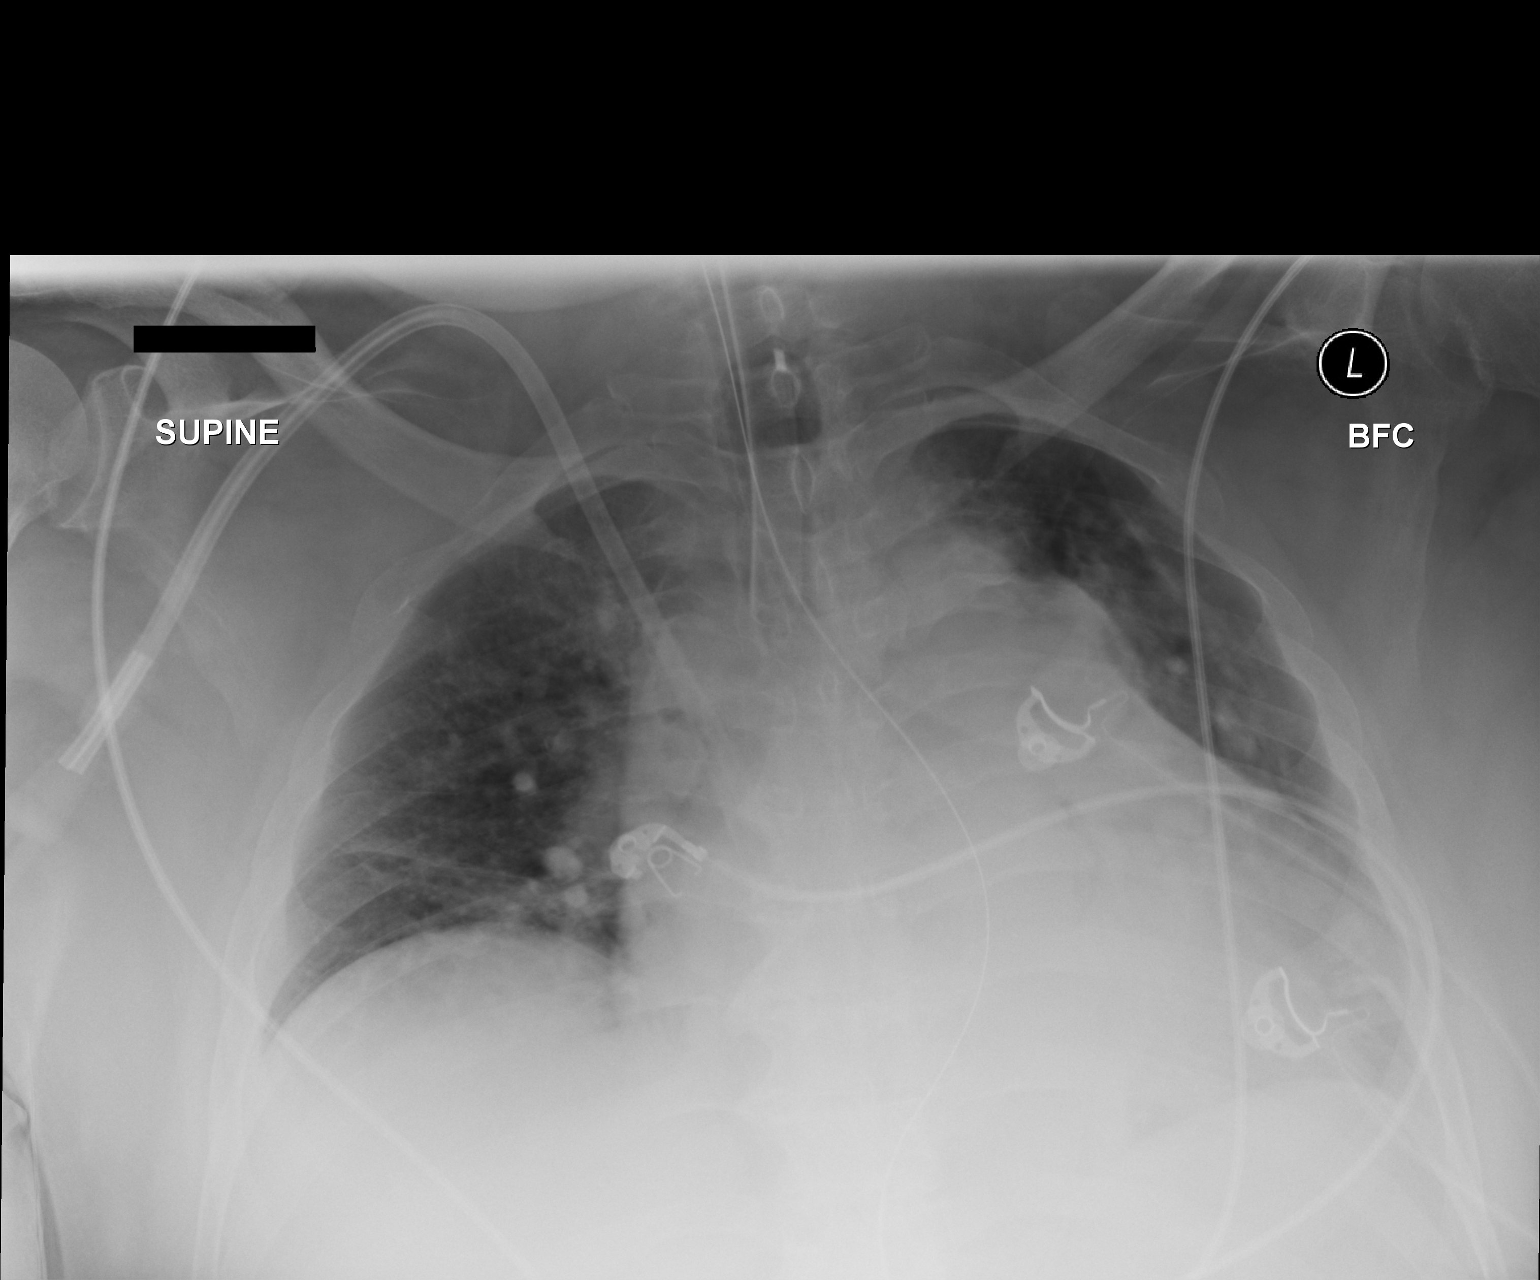

[1 of 1 positions shown; findings below may reference images not displayed]

FINDINGS: Cardiac enlargement unchanged. Limited inspiratory effect. Bilateral
lower lung zone opacity similar to prior study. Endotracheal tube
tip 19 mm above the carina. Orogastric tube extends off the inferior
edge of the film. Right central line stable.
IMPRESSION: Lines and tubes as described above.

## 2016-03-27 IMAGING — CR DG CHEST 1V PORT
1 series · 1 of 1 positions shown · non-contrast
Comparison: Single view of the chest 03/26/2015 and 03/25/2015.

CLINICAL DATA: Decreased oxygen saturation today.

EXAM:
PORTABLE CHEST 1 VIEW

[AP]
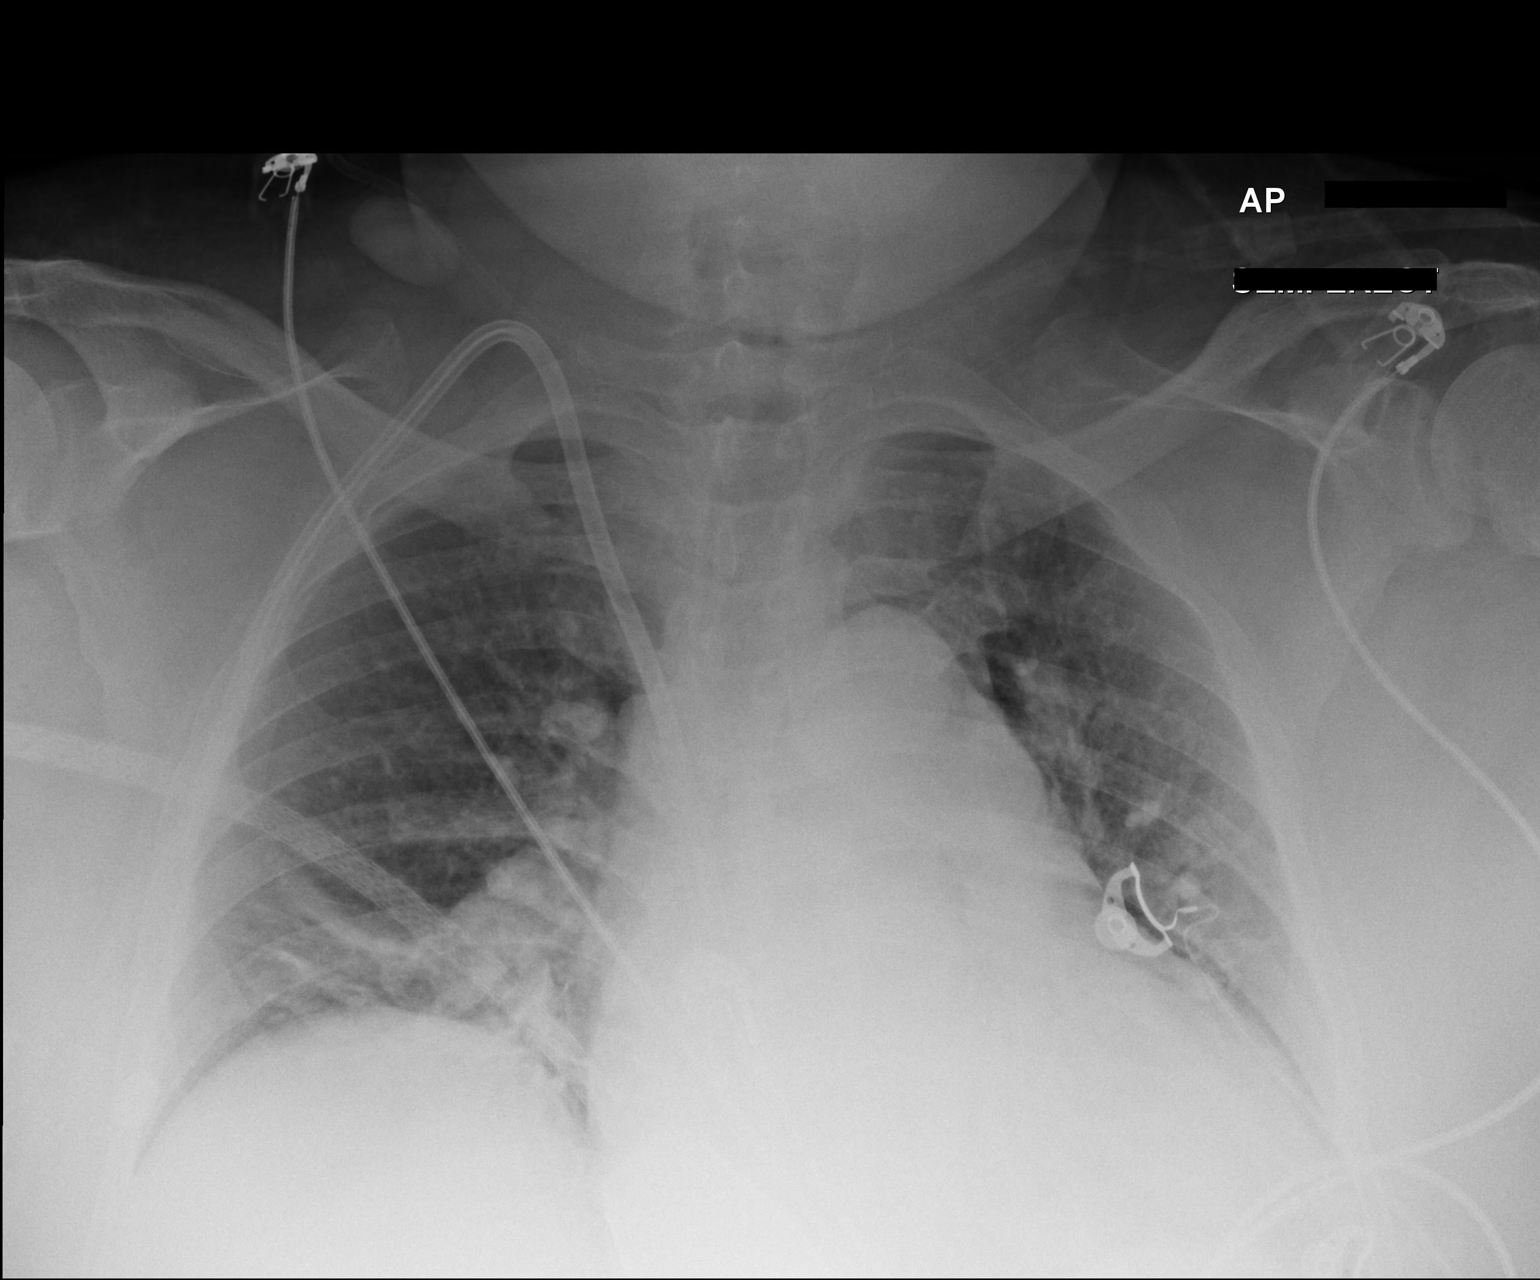

[1 of 1 positions shown; findings below may reference images not displayed]

FINDINGS: Left IJ catheter has been removed since the most recent examination.
Right IJ approach dialysis catheter remains in place. There is
cardiomegaly without edema. Subsegmental atelectasis is seen in the
right lung base. No pneumothorax or pleural effusion.
IMPRESSION: No change in subsegmental atelectasis right lung base.

Cardiomegaly without edema.

## 2016-03-27 IMAGING — CR DG CHEST 1V PORT SAME DAY
1 series · 1 of 1 positions shown · non-contrast
Comparison: 03/29/2015

CLINICAL DATA: Central line placement.

EXAM:
PORTABLE CHEST 1 VIEW

[AP]
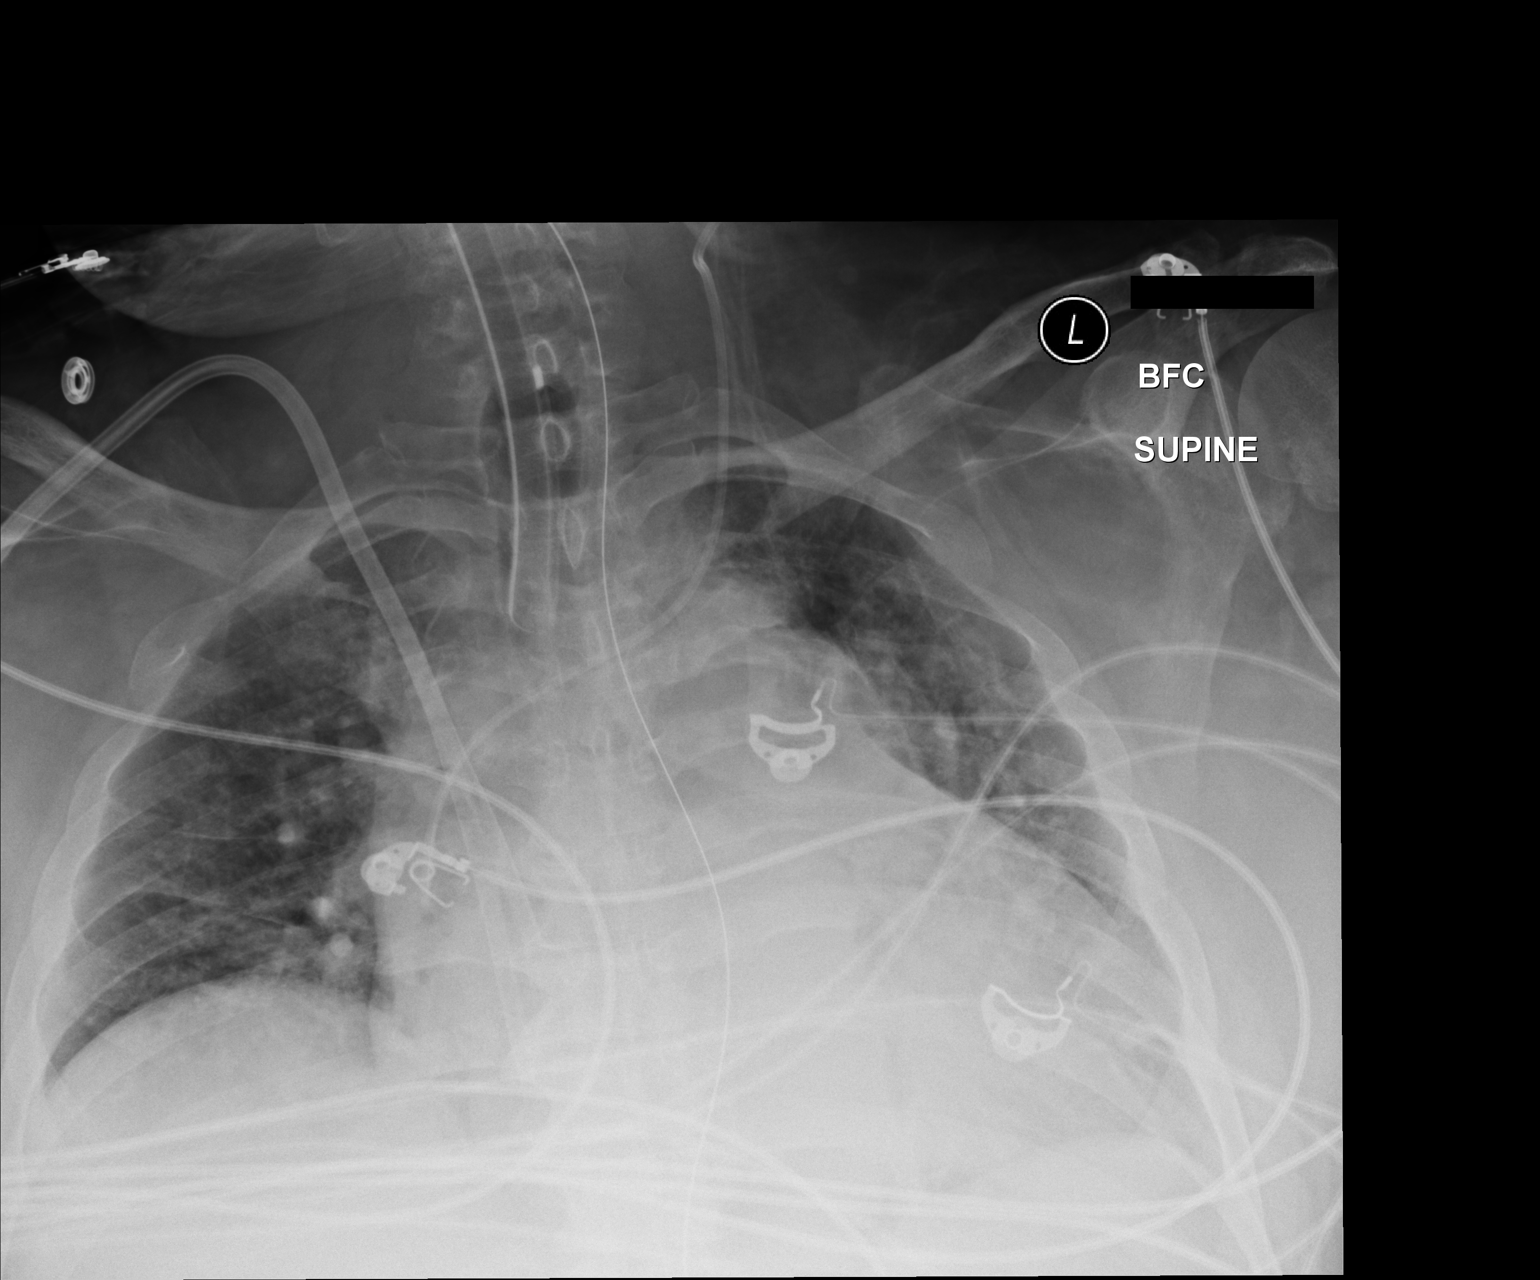

[1 of 1 positions shown; findings below may reference images not displayed]

FINDINGS: There has been interval placement of left internal jugular approach
central venous catheter with tip terminating at the expected
location of superior vena cava. Large dual lumen right internal
jugular central venous catheter, and endotracheal tube are stable.
Enteric catheter is partially visualized.

The cardiac silhouette is enlarged. Mediastinal contours appear
intact.

There is no evidence of pneumothorax. Lung volumes are low with
prominent interstitial markings.

Osseous structures are without acute abnormality. Soft tissues are
grossly normal.
IMPRESSION: Status post placement of left internal jugular approach central
venous catheter. No evidence of pneumothorax.

Otherwise stable supporting apparatus.

Low lung volumes with pulmonary vascular congestion.

Enlarged cardiac silhouette.

## 2016-05-02 IMAGING — CR DG CHEST 1V PORT
1 series · 1 of 1 positions shown · non-contrast
Comparison: Portable chest x-ray April 17, 2015

CLINICAL DATA: Acute and chronic respiratory failure, history of
CHF, end-stage renal disease

EXAM:
PORTABLE CHEST 1 VIEW

[AP]
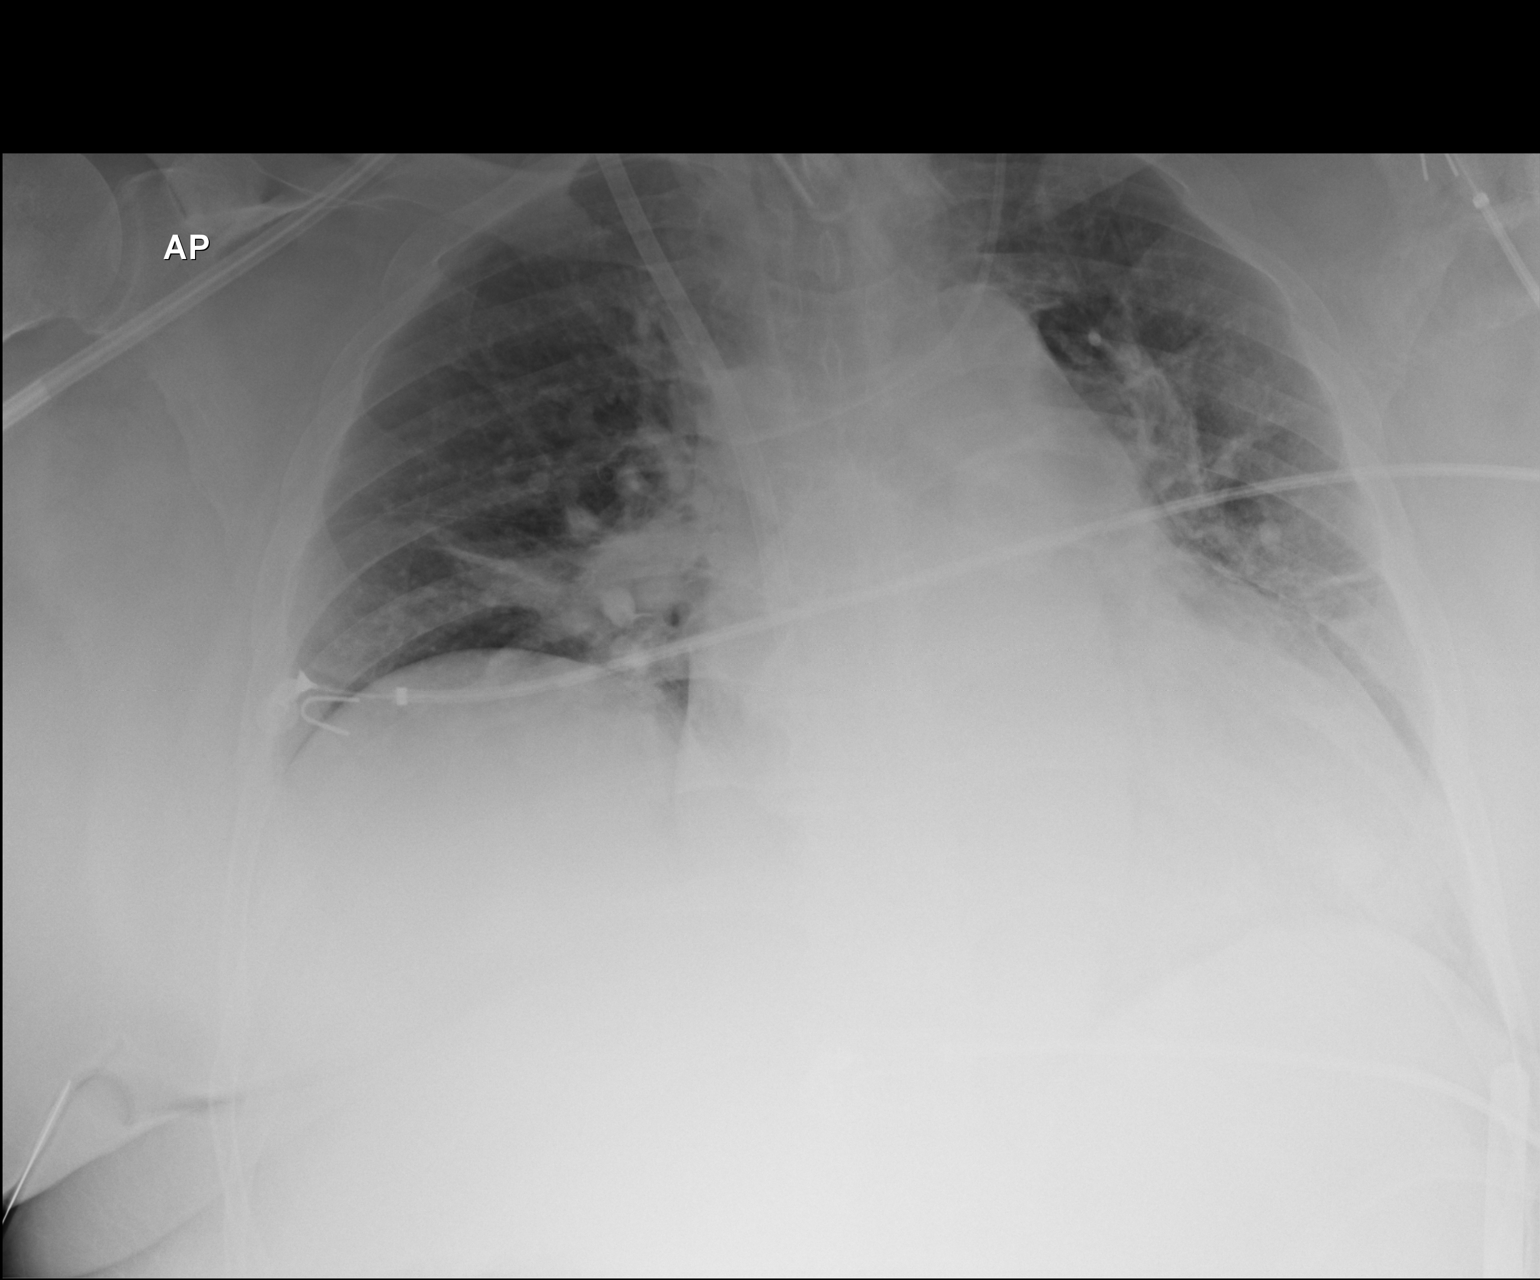

[1 of 1 positions shown; findings below may reference images not displayed]

FINDINGS: The right lung remains hypoinflated. There is persistent perihilar
subsegmental atelectasis. The left lung remains well-expanded. The
interstitial markings remain increased. The cardiac silhouette
remains enlarged and the pulmonary vascularity engorged. The
tracheostomy appliance tip projects at the superior margin of the
clavicular head. The dual-lumen dialysis type catheter tip projects
over the midportion of the SVC. The nasogastric tube has been
removed. There is no pneumothorax nor large pleural effusion.
IMPRESSION: Persistent perihilar subsegmental atelectasis. Mild CHF without
pulmonary edema or pleural effusion. The support tubes are in
reasonable position.

## 2016-05-04 IMAGING — CR DG CHEST 1V PORT
1 series · 1 of 1 positions shown · non-contrast
Comparison: May 04, 2015

CLINICAL DATA: Hypoxia, status post cardiac arrest

EXAM:
PORTABLE CHEST 1 VIEW

[AP]
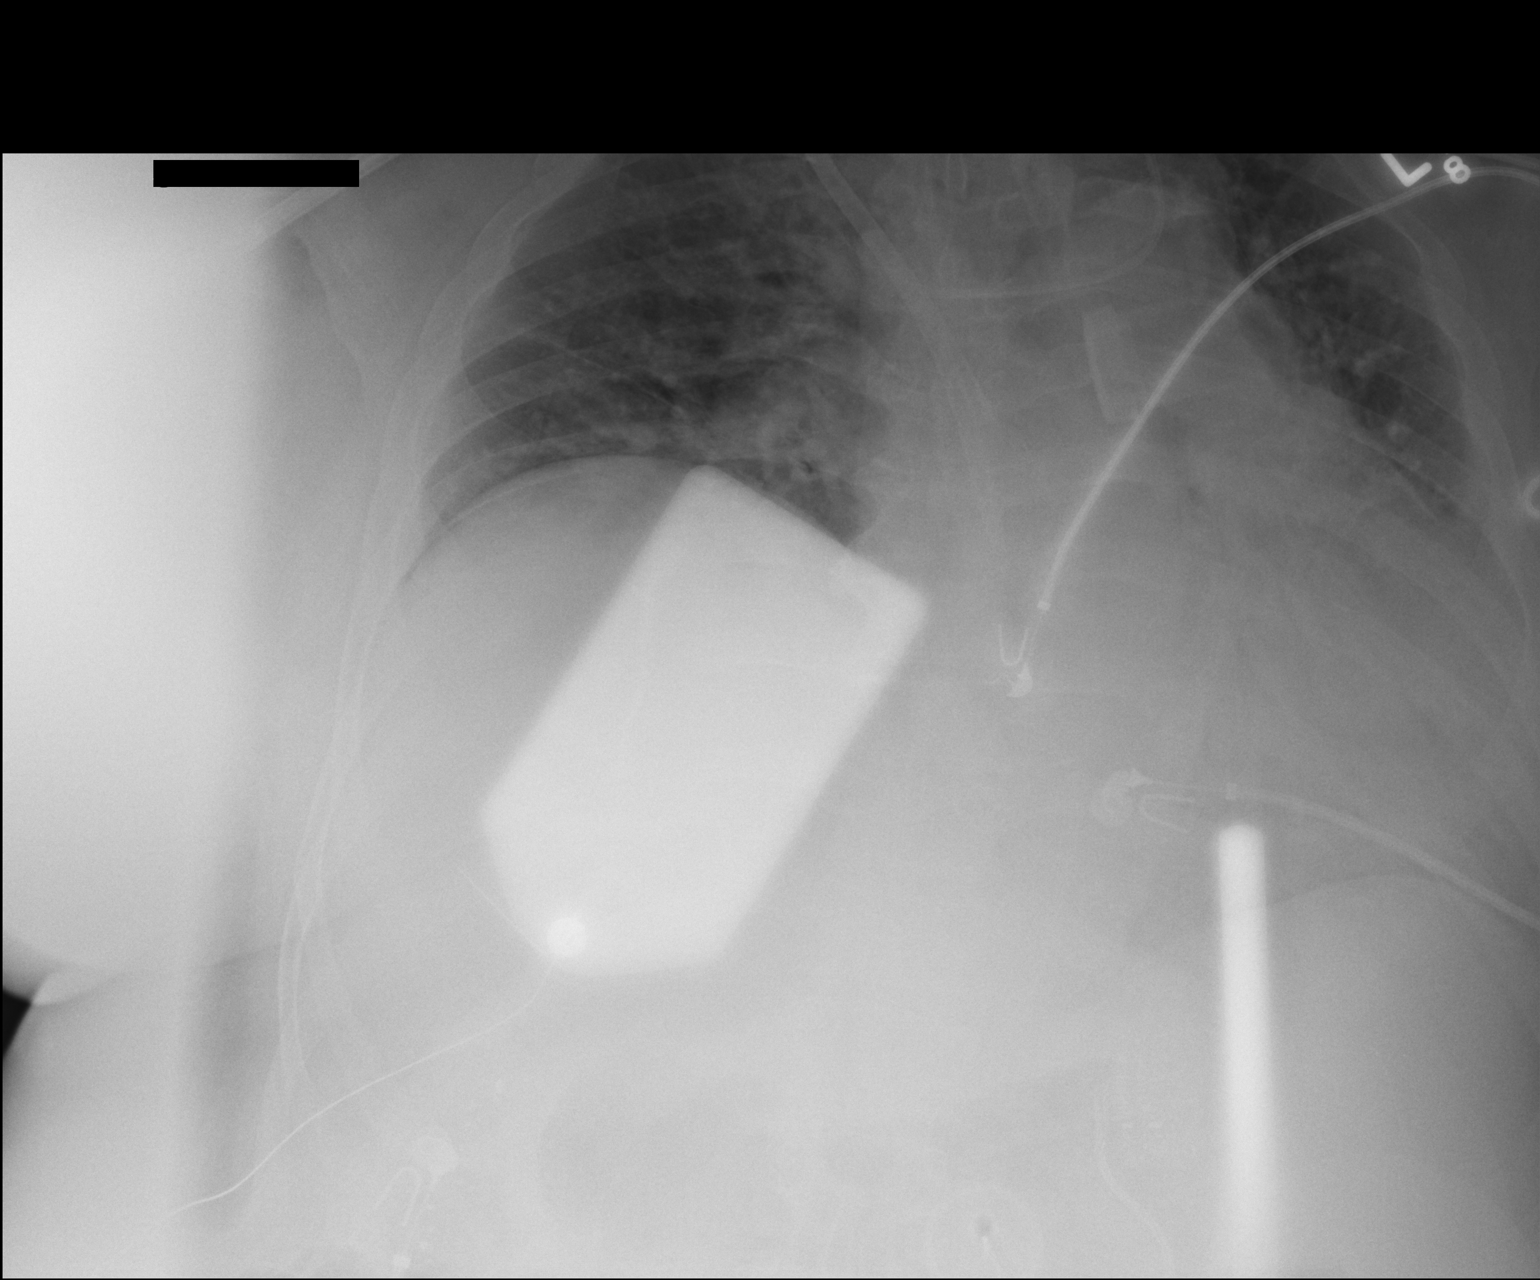

[1 of 1 positions shown; findings below may reference images not displayed]

FINDINGS: Tracheostomy catheter tip is 7.0 cm above the carina. Dual-lumen
central catheter tip is at the cavoatrial junction. Left jugular
catheter tip is in the left innominate vein near the junction with
the superior vena cava. No pneumothorax. There is persistent
elevation of the right hemidiaphragm. There is no edema or
consolidation. Cardiomegaly is stable. No adenopathy.
IMPRESSION: Tube and catheter positions as described without pneumothorax.
Stable cardiac enlargement. No edema or consolidation. Stable
elevation of the right hemidiaphragm.
